# Patient Record
Sex: Female | Born: 1945 | Race: White | Hispanic: No | Marital: Married | Smoking: Former smoker
Health system: Southern US, Community
[De-identification: ages and names within clinical notes are randomized; demographics above are authoritative.]

## PROBLEM LIST (undated history)

## (undated) DIAGNOSIS — R911 Solitary pulmonary nodule: Secondary | ICD-10-CM

## (undated) DIAGNOSIS — I771 Stricture of artery: Secondary | ICD-10-CM

## (undated) DIAGNOSIS — Z720 Tobacco use: Secondary | ICD-10-CM

## (undated) DIAGNOSIS — I700 Atherosclerosis of aorta: Secondary | ICD-10-CM

## (undated) DIAGNOSIS — I872 Venous insufficiency (chronic) (peripheral): Secondary | ICD-10-CM

## (undated) DIAGNOSIS — D649 Anemia, unspecified: Secondary | ICD-10-CM

## (undated) DIAGNOSIS — K5790 Diverticulosis of intestine, part unspecified, without perforation or abscess without bleeding: Secondary | ICD-10-CM

## (undated) DIAGNOSIS — J189 Pneumonia, unspecified organism: Secondary | ICD-10-CM

## (undated) DIAGNOSIS — B029 Zoster without complications: Secondary | ICD-10-CM

## (undated) DIAGNOSIS — E559 Vitamin D deficiency, unspecified: Secondary | ICD-10-CM

## (undated) DIAGNOSIS — R040 Epistaxis: Secondary | ICD-10-CM

## (undated) DIAGNOSIS — G5603 Carpal tunnel syndrome, bilateral upper limbs: Secondary | ICD-10-CM

## (undated) DIAGNOSIS — I5030 Unspecified diastolic (congestive) heart failure: Secondary | ICD-10-CM

## (undated) DIAGNOSIS — K219 Gastro-esophageal reflux disease without esophagitis: Secondary | ICD-10-CM

## (undated) DIAGNOSIS — I10 Essential (primary) hypertension: Secondary | ICD-10-CM

## (undated) DIAGNOSIS — R519 Headache, unspecified: Secondary | ICD-10-CM

## (undated) DIAGNOSIS — F419 Anxiety disorder, unspecified: Secondary | ICD-10-CM

## (undated) DIAGNOSIS — E039 Hypothyroidism, unspecified: Secondary | ICD-10-CM

## (undated) DIAGNOSIS — M8580 Other specified disorders of bone density and structure, unspecified site: Secondary | ICD-10-CM

## (undated) DIAGNOSIS — I2510 Atherosclerotic heart disease of native coronary artery without angina pectoris: Secondary | ICD-10-CM

## (undated) DIAGNOSIS — M353 Polymyalgia rheumatica: Secondary | ICD-10-CM

## (undated) DIAGNOSIS — J449 Chronic obstructive pulmonary disease, unspecified: Secondary | ICD-10-CM

## (undated) DIAGNOSIS — R51 Headache: Secondary | ICD-10-CM

## (undated) DIAGNOSIS — R0600 Dyspnea, unspecified: Secondary | ICD-10-CM

## (undated) DIAGNOSIS — S29011A Strain of muscle and tendon of front wall of thorax, initial encounter: Secondary | ICD-10-CM

## (undated) DIAGNOSIS — R112 Nausea with vomiting, unspecified: Secondary | ICD-10-CM

## (undated) DIAGNOSIS — Z9889 Other specified postprocedural states: Secondary | ICD-10-CM

## (undated) DIAGNOSIS — D3502 Benign neoplasm of left adrenal gland: Secondary | ICD-10-CM

## (undated) DIAGNOSIS — I5189 Other ill-defined heart diseases: Secondary | ICD-10-CM

## (undated) HISTORY — DX: Headache: R51

## (undated) HISTORY — DX: Polymyalgia rheumatica: M35.3

## (undated) HISTORY — PX: ESOPHAGOGASTRODUODENOSCOPY: SHX1529

## (undated) HISTORY — PX: TUBAL LIGATION: SHX77

## (undated) HISTORY — DX: Tobacco use: Z72.0

## (undated) HISTORY — PX: CARPAL TUNNEL RELEASE: SHX101

## (undated) HISTORY — PX: CERVICAL FUSION: SHX112

## (undated) HISTORY — DX: Venous insufficiency (chronic) (peripheral): I87.2

## (undated) HISTORY — DX: Other specified disorders of bone density and structure, unspecified site: M85.80

## (undated) HISTORY — PX: COLONOSCOPY: SHX174

## (undated) HISTORY — DX: Headache, unspecified: R51.9

## (undated) HISTORY — DX: Chronic obstructive pulmonary disease, unspecified: J44.9

## (undated) HISTORY — PX: HEMORRHOID SURGERY: SHX153

## (undated) HISTORY — PX: APPENDECTOMY: SHX54

## (undated) HISTORY — PX: LUMBAR FUSION: SHX111

## (undated) HISTORY — PX: BACK SURGERY: SHX140

## (undated) HISTORY — DX: Anxiety disorder, unspecified: F41.9

## (undated) HISTORY — DX: Gastro-esophageal reflux disease without esophagitis: K21.9

## (undated) HISTORY — DX: Pneumonia, unspecified organism: J18.9

## (undated) HISTORY — PX: ABDOMINAL HYSTERECTOMY: SHX81

## (undated) HISTORY — PX: TOTAL ABDOMINAL HYSTERECTOMY W/ BILATERAL SALPINGOOPHORECTOMY: SHX83

## (undated) HISTORY — DX: Strain of muscle and tendon of front wall of thorax, initial encounter: S29.011A

## (undated) HISTORY — PX: CHOLECYSTECTOMY: SHX55

---
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
## 1999-03-24 ENCOUNTER — Other Ambulatory Visit: Admission: RE | Admit: 1999-03-24 | Discharge: 1999-03-24 | Payer: Self-pay | Admitting: Obstetrics and Gynecology

## 2000-08-01 ENCOUNTER — Encounter: Admission: RE | Admit: 2000-08-01 | Discharge: 2000-08-01 | Payer: Self-pay | Admitting: Obstetrics and Gynecology

## 2000-08-01 ENCOUNTER — Encounter: Payer: Self-pay | Admitting: Obstetrics and Gynecology

## 2001-04-17 ENCOUNTER — Encounter: Payer: Self-pay | Admitting: Obstetrics and Gynecology

## 2001-04-17 ENCOUNTER — Encounter: Admission: RE | Admit: 2001-04-17 | Discharge: 2001-04-17 | Payer: Self-pay | Admitting: Obstetrics and Gynecology

## 2001-08-08 ENCOUNTER — Encounter: Admission: RE | Admit: 2001-08-08 | Discharge: 2001-08-08 | Payer: Self-pay | Admitting: Obstetrics and Gynecology

## 2001-08-08 ENCOUNTER — Encounter: Payer: Self-pay | Admitting: Obstetrics and Gynecology

## 2002-05-21 ENCOUNTER — Encounter: Payer: Self-pay | Admitting: Obstetrics and Gynecology

## 2002-05-21 ENCOUNTER — Encounter: Admission: RE | Admit: 2002-05-21 | Discharge: 2002-05-21 | Payer: Self-pay | Admitting: Obstetrics and Gynecology

## 2003-05-24 ENCOUNTER — Encounter: Admission: RE | Admit: 2003-05-24 | Discharge: 2003-05-24 | Payer: Self-pay | Admitting: Obstetrics and Gynecology

## 2003-10-13 LAB — HM COLONOSCOPY: HM Colonoscopy: NORMAL

## 2004-05-24 ENCOUNTER — Encounter: Admission: RE | Admit: 2004-05-24 | Discharge: 2004-05-24 | Payer: Self-pay | Admitting: Obstetrics and Gynecology

## 2004-06-22 ENCOUNTER — Ambulatory Visit (HOSPITAL_COMMUNITY): Admission: RE | Admit: 2004-06-22 | Discharge: 2004-06-23 | Payer: Self-pay | Admitting: Neurosurgery

## 2004-06-22 HISTORY — PX: ANTERIOR CERVICAL DECOMP/DISCECTOMY FUSION: SHX1161

## 2004-07-12 ENCOUNTER — Encounter: Admission: RE | Admit: 2004-07-12 | Discharge: 2004-07-12 | Payer: Self-pay | Admitting: Neurosurgery

## 2005-03-14 ENCOUNTER — Emergency Department (HOSPITAL_COMMUNITY): Admission: EM | Admit: 2005-03-14 | Discharge: 2005-03-14 | Payer: Self-pay | Admitting: Emergency Medicine

## 2005-05-25 ENCOUNTER — Encounter: Admission: RE | Admit: 2005-05-25 | Discharge: 2005-05-25 | Payer: Self-pay | Admitting: Obstetrics and Gynecology

## 2005-06-18 ENCOUNTER — Encounter: Admission: RE | Admit: 2005-06-18 | Discharge: 2005-06-18 | Payer: Self-pay | Admitting: Family Medicine

## 2005-10-27 ENCOUNTER — Ambulatory Visit: Payer: Self-pay | Admitting: Rheumatology

## 2006-04-24 ENCOUNTER — Emergency Department (HOSPITAL_COMMUNITY): Admission: EM | Admit: 2006-04-24 | Discharge: 2006-04-24 | Payer: Self-pay | Admitting: Emergency Medicine

## 2006-05-29 ENCOUNTER — Encounter: Admission: RE | Admit: 2006-05-29 | Discharge: 2006-05-29 | Payer: Self-pay | Admitting: Family Medicine

## 2006-12-13 ENCOUNTER — Ambulatory Visit: Payer: Self-pay | Admitting: Family Medicine

## 2007-06-02 ENCOUNTER — Encounter: Payer: Self-pay | Admitting: Family Medicine

## 2007-06-02 ENCOUNTER — Encounter: Admission: RE | Admit: 2007-06-02 | Discharge: 2007-06-02 | Payer: Self-pay | Admitting: Family Medicine

## 2007-06-26 ENCOUNTER — Encounter: Payer: Self-pay | Admitting: Family Medicine

## 2007-06-26 ENCOUNTER — Encounter: Admission: RE | Admit: 2007-06-26 | Discharge: 2007-06-26 | Payer: Self-pay | Admitting: Family Medicine

## 2007-07-12 ENCOUNTER — Emergency Department (HOSPITAL_COMMUNITY): Admission: EM | Admit: 2007-07-12 | Discharge: 2007-07-13 | Payer: Self-pay | Admitting: Emergency Medicine

## 2008-03-28 ENCOUNTER — Emergency Department (HOSPITAL_COMMUNITY): Admission: EM | Admit: 2008-03-28 | Discharge: 2008-03-29 | Payer: Self-pay | Admitting: Emergency Medicine

## 2008-04-06 ENCOUNTER — Encounter: Payer: Self-pay | Admitting: Family Medicine

## 2008-06-08 ENCOUNTER — Encounter: Admission: RE | Admit: 2008-06-08 | Discharge: 2008-06-08 | Payer: Self-pay | Admitting: Family Medicine

## 2008-06-08 ENCOUNTER — Encounter: Payer: Self-pay | Admitting: Family Medicine

## 2008-06-11 LAB — HM PAP SMEAR

## 2008-06-11 LAB — CONVERTED CEMR LAB: Pap Smear: NORMAL

## 2008-06-22 ENCOUNTER — Encounter: Payer: Self-pay | Admitting: Family Medicine

## 2008-06-29 ENCOUNTER — Encounter: Payer: Self-pay | Admitting: Family Medicine

## 2008-08-31 ENCOUNTER — Encounter: Payer: Self-pay | Admitting: Family Medicine

## 2008-09-28 ENCOUNTER — Encounter: Payer: Self-pay | Admitting: Family Medicine

## 2008-10-01 ENCOUNTER — Ambulatory Visit: Payer: Self-pay | Admitting: Family Medicine

## 2008-10-01 DIAGNOSIS — J4489 Other specified chronic obstructive pulmonary disease: Secondary | ICD-10-CM | POA: Insufficient documentation

## 2008-10-01 DIAGNOSIS — R519 Headache, unspecified: Secondary | ICD-10-CM | POA: Insufficient documentation

## 2008-10-01 DIAGNOSIS — J449 Chronic obstructive pulmonary disease, unspecified: Secondary | ICD-10-CM | POA: Insufficient documentation

## 2008-10-01 DIAGNOSIS — F418 Other specified anxiety disorders: Secondary | ICD-10-CM | POA: Insufficient documentation

## 2008-10-01 DIAGNOSIS — F17200 Nicotine dependence, unspecified, uncomplicated: Secondary | ICD-10-CM | POA: Insufficient documentation

## 2008-10-01 DIAGNOSIS — R51 Headache: Secondary | ICD-10-CM | POA: Insufficient documentation

## 2008-10-01 DIAGNOSIS — M1990 Unspecified osteoarthritis, unspecified site: Secondary | ICD-10-CM | POA: Insufficient documentation

## 2008-10-01 DIAGNOSIS — K219 Gastro-esophageal reflux disease without esophagitis: Secondary | ICD-10-CM | POA: Insufficient documentation

## 2008-10-04 ENCOUNTER — Telehealth: Payer: Self-pay | Admitting: Family Medicine

## 2008-10-05 ENCOUNTER — Ambulatory Visit: Payer: Self-pay | Admitting: Cardiology

## 2008-10-19 ENCOUNTER — Ambulatory Visit: Payer: Self-pay | Admitting: Family Medicine

## 2008-12-07 ENCOUNTER — Ambulatory Visit: Payer: Self-pay | Admitting: Family Medicine

## 2009-01-25 ENCOUNTER — Ambulatory Visit: Payer: Self-pay | Admitting: Family Medicine

## 2009-01-25 DIAGNOSIS — L84 Corns and callosities: Secondary | ICD-10-CM | POA: Insufficient documentation

## 2009-04-14 ENCOUNTER — Ambulatory Visit: Payer: Self-pay | Admitting: Family Medicine

## 2009-05-20 ENCOUNTER — Ambulatory Visit: Payer: Self-pay | Admitting: Family Medicine

## 2009-05-20 ENCOUNTER — Encounter: Admission: RE | Admit: 2009-05-20 | Discharge: 2009-05-20 | Payer: Self-pay | Admitting: Internal Medicine

## 2009-05-20 DIAGNOSIS — M25579 Pain in unspecified ankle and joints of unspecified foot: Secondary | ICD-10-CM | POA: Insufficient documentation

## 2009-05-27 ENCOUNTER — Ambulatory Visit: Payer: Self-pay | Admitting: Family Medicine

## 2009-05-27 DIAGNOSIS — J209 Acute bronchitis, unspecified: Secondary | ICD-10-CM | POA: Insufficient documentation

## 2009-06-17 ENCOUNTER — Encounter: Admission: RE | Admit: 2009-06-17 | Discharge: 2009-06-17 | Payer: Self-pay | Admitting: Obstetrics and Gynecology

## 2009-06-17 LAB — HM MAMMOGRAPHY

## 2009-07-22 ENCOUNTER — Encounter: Admission: RE | Admit: 2009-07-22 | Discharge: 2009-07-22 | Payer: Self-pay | Admitting: Obstetrics and Gynecology

## 2009-07-29 ENCOUNTER — Ambulatory Visit: Payer: Self-pay | Admitting: Family Medicine

## 2009-07-29 DIAGNOSIS — M5430 Sciatica, unspecified side: Secondary | ICD-10-CM | POA: Insufficient documentation

## 2009-08-01 ENCOUNTER — Telehealth: Payer: Self-pay | Admitting: Family Medicine

## 2009-09-27 ENCOUNTER — Encounter: Payer: Self-pay | Admitting: Family Medicine

## 2009-09-29 ENCOUNTER — Ambulatory Visit: Payer: Self-pay | Admitting: Family Medicine

## 2009-09-29 DIAGNOSIS — M67919 Unspecified disorder of synovium and tendon, unspecified shoulder: Secondary | ICD-10-CM | POA: Insufficient documentation

## 2009-09-29 DIAGNOSIS — E039 Hypothyroidism, unspecified: Secondary | ICD-10-CM | POA: Insufficient documentation

## 2009-09-29 DIAGNOSIS — M719 Bursopathy, unspecified: Secondary | ICD-10-CM

## 2009-11-07 ENCOUNTER — Ambulatory Visit: Payer: Self-pay | Admitting: Family Medicine

## 2009-11-07 LAB — CONVERTED CEMR LAB: TSH: 1.5 microintl units/mL (ref 0.35–5.50)

## 2009-12-22 ENCOUNTER — Ambulatory Visit: Payer: Self-pay | Admitting: Family Medicine

## 2009-12-22 DIAGNOSIS — J069 Acute upper respiratory infection, unspecified: Secondary | ICD-10-CM | POA: Insufficient documentation

## 2009-12-22 LAB — CONVERTED CEMR LAB: Rapid Strep: NEGATIVE

## 2010-01-30 ENCOUNTER — Ambulatory Visit
Admission: RE | Admit: 2010-01-30 | Discharge: 2010-01-30 | Payer: Self-pay | Source: Home / Self Care | Attending: Family Medicine | Admitting: Family Medicine

## 2010-01-31 NOTE — Assessment & Plan Note (Signed)
Summary: COUGHING & CONGESTION / LFW   Vital Signs:  Patient profile:   65 year old female Weight:      172 pounds Temp:     98.9 degrees F oral Pulse rate:   64 / minute Pulse rhythm:   regular BP sitting:   140 / 80  (left arm) Cuff size:   regular  Vitals Entered By: Lowella Petties CMA (October 19, 2008 9:07 AM) CC: Cough and congestion   History of Present Illness: 65 yo with 1 week h/o productive cough, wheeze, and subjective fevers.  No sore throat.  Mild nasal congestion.  No shortness of breath, chest pain, n/v/d.  Current Medications (verified): 1)  Enjuvia 0.3 Mg Tabs (Estrogens Conj Synthetic B) .... Take 1 Tablet By Mouth Once A Day 2)  Alendronate Sodium 70 Mg Tabs (Alendronate Sodium) .... Take One Tablet By Mouth Once Per Week 3)  Zyban 150 Mg Xr12h-Tab (Bupropion Hcl (Smoking Deter)) .Marland Kitchen.. 1 Tab By Mouth Daily X 3 Days, Then May Increase To 1 Tab By Mouth Two Times A Day.  Continue For At Least 7 Weeks. Dispense Qs 4)  Augmentin Xr 1000-62.5 Mg Xr12h-Tab (Amoxicillin-Pot Clavulanate) .Marland Kitchen.. 1 Tab By Mouth Two Times A Day X 10 Days 5)  Cheratussin Ac 100-10 Mg/71ml Syrp (Guaifenesin-Codeine) .Marland Kitchen.. 1 Tsp Every 6 Hours As Needed For Cough.  Allergies (verified): No Known Drug Allergies  Review of Systems      See HPI General:  Complains of fever; denies weakness and weight loss. ENT:  Complains of postnasal drainage; denies earache, sinus pressure, and sore throat. CV:  Denies chest pain or discomfort. Resp:  Complains of cough, sputum productive, and wheezing; denies chest discomfort, chest pain with inspiration, and shortness of breath. GI:  Denies change in bowel habits, nausea, and vomiting.  Physical Exam  General:  Well-developed,well-nourished,in no acute distress; alert,appropriate and cooperative throughout examination, overweight. Ears:  External ear exam shows no significant lesions or deformities.  Otoscopic examination reveals clear canals, tympanic  membranes are intact bilaterally without bulging, retraction, inflammation or discharge. Hearing is grossly normal bilaterally. Mouth:  Oral mucosa and oropharynx without lesions or exudates.  Teeth in good repair. Lungs:  normal respiratory effort, diffuse exp wheezes, no crackles.   Heart:  Normal rate and regular rhythm. S1 and S2 normal without gallop, murmur, click, rub or other extra sounds.   Impression & Recommendations:  Problem # 1:  ACUTE BRONCHITIS (ICD-466.0) Assessment New Will treat with 10 day course of Augmentin.  See patient instructions.  Her updated medication list for this problem includes:    Augmentin Xr 1000-62.5 Mg Xr12h-tab (Amoxicillin-pot clavulanate) .Marland Kitchen... 1 tab by mouth two times a day x 10 days    Cheratussin Ac 100-10 Mg/24ml Syrp (Guaifenesin-codeine) .Marland Kitchen... 1 tsp every 6 hours as needed for cough.  Complete Medication List: 1)  Enjuvia 0.3 Mg Tabs (Estrogens conj synthetic b) .... Take 1 tablet by mouth once a day 2)  Alendronate Sodium 70 Mg Tabs (Alendronate sodium) .... Take one tablet by mouth once per week 3)  Zyban 150 Mg Xr12h-tab (Bupropion hcl (smoking deter)) .Marland Kitchen.. 1 tab by mouth daily x 3 days, then may increase to 1 tab by mouth two times a day.  continue for at least 7 weeks. dispense qs 4)  Augmentin Xr 1000-62.5 Mg Xr12h-tab (Amoxicillin-pot clavulanate) .Marland Kitchen.. 1 tab by mouth two times a day x 10 days 5)  Cheratussin Ac 100-10 Mg/26ml Syrp (Guaifenesin-codeine) .Marland Kitchen.. 1 tsp every 6  hours as needed for cough.  Patient Instructions: 1)   You can try mucinex over the counter twice daily as directed and  2)  Please take all your antibiotics as prescribed. 3)  Do not drive after taking the cough medicine.  4)   Call if symptoms worsen or if not improved in 4-5 days Prescriptions: CHERATUSSIN AC 100-10 MG/5ML SYRP (GUAIFENESIN-CODEINE) 1 tsp every 6 hours as needed for cough.  #3 ounces x 0   Entered and Authorized by:   [name] MD   Signed by:    [name] MD on 10/19/2008   Method used:   Print then Give to Patient   RxID:   [id number] AUGMENTIN XR 1000-62.5 MG XR12H-TAB (AMOXICILLIN-POT CLAVULANATE) 1 tab by mouth two times a day x 10 days  #20 x 0   Entered and Authorized by:   [name] MD   Signed by:   [name] MD on 10/19/2008   Method used:   [facility]        [facility] [id number]       [address]       [place], [name]  [address]       Ph: [phone] or [phone]       Fax: [phone]   RxID:   [id number]   Prior Medications (reviewed today): ENJUVIA 0.3 MG TABS (ESTROGENS CONJ SYNTHETIC B) Take 1 tablet by mouth once a day ALENDRONATE SODIUM 70 MG TABS (ALENDRONATE SODIUM) Take one tablet by mouth once per week ZYBAN 150 MG XR12H-TAB (BUPROPION HCL (SMOKING DETER)) 1 tab by mouth daily x 3 days, then may increase to 1 tab by mouth two times a day.  Continue for at least 7 weeks. dispense qs Current Allergies (reviewed today): No known allergies

## 2010-01-31 NOTE — Assessment & Plan Note (Signed)
Summary: CATCH IN BACK PROBLEMS WITH LEFT LEG/RBH   Vital Signs:  Patient profile:   65 year old female Height:      59.75 inches Weight:      177.25 pounds BMI:     35.03 Temp:     98.3 degrees F oral Pulse rate:   88 / minute Pulse rhythm:   regular BP sitting:   150 / 70  (left arm) Cuff size:   large  Vitals Entered By: Linde Gillis CMA Duncan Dull) (July 29, 2009 11:43 AM) CC: catch in back, problems with left leg   History of Present Illness: 65 yo with 2 weeks of worsening left sided back pain with radiulopathy and pain down left lower extremity, past knee. No LE weakness. No urinary symptoms. Pain is the worst when she stands from a seated position or makes sudden movements. Ibuprofen not helping much with pain.  Current Medications (verified): 1)  Enjuvia 0.3 Mg Tabs (Estrogens Conj Synthetic B) .... Take 1 Tablet By Mouth Once A Day 2)  Alendronate Sodium 70 Mg Tabs (Alendronate Sodium) .... Take One Tablet By Mouth Once Per Week 3)  Advair Diskus 250-50 Mcg/dose Misc (Fluticasone-Salmeterol) .Marland Kitchen.. 1 Puff 2 Times Daily 4)  Cyclobenzaprine Hcl 10 Mg  Tabs (Cyclobenzaprine Hcl) .Marland Kitchen.. 1 By Mouth 2 Times Daily As Needed For Back Pain 5)  Prednisone 20 Mg Tabs (Prednisone) .... 3 Tabs By Mouth X 5 Days, 2 Tabs By Mouth X 3 Days, 1 Tab By Mouth X 3 Days Then Stop. Dispense Qs  Allergies (verified): No Known Drug Allergies  Past History:  Past Medical History: Last updated: 2008/10/03 Anxiety COPD GERD TObacco abuse Osteopenia Reportedly normal stress test this year (awaiting records from [facility]).  Past Surgical History: Last updated: 10-03-08 Hysterectomy - over 30 years ago, for endometriosis Lumbar fusion Appendectomy Cholecystectomy Hemorrhoidectomy  Family History: Last updated: 10/03/08 Dad died of lung CA at 56 yo Mom died at 25- sudden cardiac death. Had 9 brothers and sisters- two siblings with HTN and DMII  Social  History: Last updated: 03-Oct-2008 Lives in [place] with her husband.  Takes care of her two grandchildren, ages 19 and 2 everyday in [place].  Has one son who is 53 yo.    Review of Systems      See HPI General:  Denies fever. GU:  Denies dysuria, incontinence, urinary frequency, and urinary hesitancy. MS:  Complains of low back pain; denies muscle weakness.  Physical Exam  General:  Well-developed,well-nourished,in no acute distress; alert,appropriate and cooperative throughout examination, overweight. Msk:  Left lumbar paraspinous muscles tight, NTTP. SLR pos left. Neg fabers. Normal strength Neurologic:  gait normal, slow to rise from chair due to pain. Psych:  memory intact for recent and remote, normally interactive, good eye contact, not anxious appearing, and not depressed appearing.     Impression & Recommendations:  Problem # 1:  SCIATICA, ACUTE (ICD-724.3) Assessment New Given duration of symptoms, will treat with Flexeril and Prednisone. See pt instructions for details. Her updated medication list for this problem includes:    Cyclobenzaprine Hcl 10 Mg Tabs (Cyclobenzaprine hcl) .Marland Kitchen... 1 by mouth 2 times daily as needed for back pain  Complete Medication List: 1)  Enjuvia 0.3 Mg Tabs (Estrogens conj synthetic b) .... Take 1 tablet by mouth once a day 2)  Alendronate Sodium 70 Mg Tabs (Alendronate sodium) .... Take one tablet by mouth once per week 3)  Advair Diskus 250-50 Mcg/dose Misc (Fluticasone-salmeterol) .Marland KitchenMarland KitchenMarland Kitchen 1  puff 2 times daily 4)  Cyclobenzaprine Hcl 10 Mg Tabs (Cyclobenzaprine hcl) .Marland Kitchen.. 1 by mouth 2 times daily as needed for back pain 5)  Prednisone 20 Mg Tabs (Prednisone) .... 3 tabs by mouth x 5 days, 2 tabs by mouth x 3 days, 1 tab by mouth x 3 days then stop. dispense qs  Patient Instructions: 1)  Most patients (90%) with low back pain will improve with time ( 2-6 weeks). Keep active but avoid activities that are painful. Apply moist heat and/or ice to  lower back several times a day.  2)  Prednisone taper as directed, Flexeril as needed. Prescriptions: PREDNISONE 20 MG TABS (PREDNISONE) 3 tabs by mouth x 5 days, 2 tabs by mouth x 3 days, 1 tab by mouth x 3 days then stop. dispense qs  #1 x 0   Entered and Authorized by:   [name] MD   Signed by:   [name] MD on 07/29/2009   Method used:   [facility]        [facility] [id number]       [address]       [place], [name]  [address]       Ph: [phone] or [phone]       Fax: [phone]   RxID:   [id number] CYCLOBENZAPRINE HCL 10 MG  TABS (CYCLOBENZAPRINE HCL) 1 by mouth 2 times daily as needed for back pain  #20 x 0   Entered and Authorized by:   [name] MD   Signed by:   [name] MD on 07/29/2009   Method used:   [facility]        [facility]retail[facility]       [address]       [place], [name]  [address]       Ph: [phone] or [phone]       Fax: [phone]   RxID:   [id number]   Current Allergies (reviewed today): No known allergies

## 2010-01-31 NOTE — Assessment & Plan Note (Signed)
Summary: DISCUSS THYROID,COUGH,L ARM PAIN/   Vital Signs:  Patient profile:   65 year old female Height:      59.75 inches Weight:      180 pounds BMI:     35.58 Temp:     97.7 degrees F oral Pulse rate:   80 / minute Pulse rhythm:   regular BP sitting:   112 / 64  (right arm) Cuff size:   large  Vitals Entered By: Linde Gillis CMA Duncan Dull) (September 29, 2009 12:07 PM) CC: 30 minute appt, discuss thyroid   History of Present Illness: 65 yo well known to me here to discuss several issues.  1.  Low thyroid- saw Dr. Loreta Ave for colonscopy and was called yesterday by their office saying that her TSH was low and needed to be followed up with her primary doctor.  We do not have those labs yet.  She has noticed increased fatigue and dry skin over past several months.  Denies any other symptoms of hypothyroidism.  2.  Left arm pain- h/o arthritis, shoulder always "aches" but now when she reaches overhead, left shoulder and top of arm hurt.  No weakness or radiculopathy.  3.  ?Bronchitis- h/o bronchitis, has had increased cough, sputum production and wheezing over past several weeks.  No fevers, chills, SOB or CP.  Current Medications (verified): 1)  Enjuvia 0.3 Mg Tabs (Estrogens Conj Synthetic B) .... Take 1 Tablet By Mouth Once A Day 2)  Vitamin D (Ergocalciferol) 50000 Unit Caps (Ergocalciferol) .... Take One Tablet By Mouth Once A Week 3)  Levothyroxine Sodium 75 Mcg  Tabs (Levothyroxine Sodium) .Marland Kitchen.. 1 By Mouth Daily 4)  Meloxicam 15 Mg Tabs (Meloxicam) .... One By Mouth Daily 5)  Azithromycin 250 Mg  Tabs (Azithromycin) .... 2 By  Mouth Today and Then 1 Daily For 4 Days  Allergies (verified): 1)  ! Prednisone  Past History:  Past Medical History: Last updated: 2008-10-31 Anxiety COPD GERD TObacco abuse Osteopenia Reportedly normal stress test this year (awaiting records from [facility]).  Past Surgical History: Last updated: 10-31-2008 Hysterectomy - over  30 years ago, for endometriosis Lumbar fusion Appendectomy Cholecystectomy Hemorrhoidectomy  Family History: Last updated: Oct 31, 2008 Dad died of lung CA at 39 yo Mom died at 44- sudden cardiac death. Had 9 brothers and sisters- two siblings with HTN and DMII  Social History: Last updated: 31-Oct-2008 Lives in [place] with her husband.  Takes care of her two grandchildren, ages 65 and 2 everyday in [place].  Has one son who is 72 yo.    Review of Systems      See HPI General:  Denies fever. Eyes:  Denies blurring. ENT:  Complains of postnasal drainage, sinus pressure, and sore throat; denies difficulty swallowing. CV:  Denies chest pain or discomfort. Resp:  Complains of cough, sputum productive, and wheezing; denies shortness of breath. GI:  Denies abdominal pain, nausea, and vomiting. GU:  Denies urinary frequency and urinary hesitancy. MS:  Denies muscle weakness. Derm:  Complains of dryness; denies rash. Neuro:  Denies headaches. Psych:  Denies anxiety and depression. Endo:  Denies cold intolerance, heat intolerance, and weight change. Heme:  Denies abnormal bruising and bleeding.  Physical Exam  General:  Well-developed,well-nourished,in no acute distress; alert,appropriate and cooperative throughout examination, overweight. VSS, non toxic appearing. Head:  Normocephalic and atraumatic without obvious abnormalities. No apparent alopecia. Sinuses NT. Eyes:  vision grossly intact, pupils equal, pupils round, and pupils reactive to light.   Ears:  External ear exam shows no significant lesions or deformities.  Otoscopic examination reveals clear canals, tympanic membranes are intact bilaterally without bulging, retraction, inflammation or discharge. Hearing is grossly normal bilaterally. Nose:  Mildly inflamed nasal mucous membranes. Mouth:  Oral mucosa and oropharynx without lesions or exudates.  Teeth in good repair. Pharynx minimally erythematouis. Neck:  No  deformities, masses, or tenderness noted. Lungs:  Ronchi, congestion and wheezing in bilat bases, LLL>>RLL. Heart:  Normal rate and regular rhythm. S1 and S2 normal without gallop, murmur, click, rub or other extra sounds. Abdomen:  Bowel sounds positive,abdomen soft and non-tender without masses, organomegaly or hernias noted. Msk:  Left shoulder- pos empty can, normal strength, pos arch sign, no ttp over [facility] joint. Pulses:  R and L carotid,radial,femoral,dorsalis pedis and posterior tibial pulses are full and equal bilaterally Extremities:  No clubbing, cyanosis, edema, or deformity noted with normal full range of motion of all joints.   No edema Neurologic:  alert & oriented X3 and gait normal.   Skin:  Intact without suspicious lesions or rashes Cervical Nodes:  No lymphadenopathy noted Psych:  memory intact for recent and remote, normally interactive, good eye contact, not anxious appearing, and not depressed appearing.     Impression & Recommendations:  Problem # 1:  UNSPECIFIED HYPOTHYROIDISM (ICD-244.9) Assessment New Labs requested from Dr. Kenna Gilbert office and reviewed. TSH 6.826. Start Levothyroxine 75 micrograms daily, follow up in 6-8 weeks. Her updated medication list for this problem includes:    Levothyroxine Sodium 75 Mcg Tabs (Levothyroxine sodium) .Marland Kitchen... 1 by mouth daily  Problem # 2:  ROTATOR CUFF INJURY, LEFT SHOULDER (ICD-726.10) Assessment: New Will try conservative management with Meloxicam and stretches- has allergy to prednisone. Unlikely a tear given exam findings today.  Problem # 3:  COPD (ICD-496) Assessment: Deteriorated with bronchitis. Given prior h/o will treat with Zpack, continue Advair with as needed albuterol. The following medications were removed from the medication list:    Advair Diskus 250-50 Mcg/dose Misc (Fluticasone-salmeterol) .Marland Kitchen... 1 puff 2 times daily  Complete Medication List: 1)  Enjuvia 0.3 Mg Tabs (Estrogens conj synthetic b) ....  Take 1 tablet by mouth once a day 2)  Vitamin D (ergocalciferol) 50000 Unit Caps (Ergocalciferol) .... Take one tablet by mouth once a week 3)  Levothyroxine Sodium 75 Mcg Tabs (Levothyroxine sodium) .Marland Kitchen.. 1 by mouth daily 4)  Meloxicam 15 Mg Tabs (Meloxicam) .... One by mouth daily 5)  Azithromycin 250 Mg Tabs (Azithromycin) .... 2 by  mouth today and then 1 daily for 4 days  Patient Instructions: 1)  Please make an appointment to come see me in 6 weeks (morning). Prescriptions: AZITHROMYCIN 250 MG  TABS (AZITHROMYCIN) 2 by  mouth today and then 1 daily for 4 days  #6 x 0   Entered and Authorized by:   [name] MD   Signed by:   [name] MD on 09/29/2009   Method used:   [facility]        [facility] [facility]       [address]       [place], [name]  [address]       Ph: [phone] or [phone]       Fax: [phone]   RxID:   [id number] MELOXICAM 15 MG TABS (MELOXICAM) one by mouth daily  #30 x 0   Entered and Authorized by:   [name] MD   Signed by:   [name] MD on 09/29/2009  Method used:   [facility]        [facility] ([facility]ail)       [address]       [place], [name]  [address]       Ph: [phone] or [phone]       Fax: [phone]   RxID:   [id number] LEVOTHYROXINE SODIUM 75 MCG  TABS (LEVOTHYROXINE SODIUM) 1 by mouth daily  #60 x 0   Entered and Authorized by:   [name] MD   Signed by:   [name] MD on 09/29/2009   Method used:   [facility]        [facility] [facility])       [address]       [place], [name]  [address]       Ph: [phone] or [phone]       Fax: [phone]   RxID:   [id number]   Current Allergies (reviewed today): ! PREDNISONE

## 2010-01-31 NOTE — Progress Notes (Signed)
Summary: Reaction to Prednisone  Phone Note Call from Patient Call back at cell [phone]   Caller: Patient Call For: [name] MD Summary of Call: Patient was given Prednisone on Friday and now she is "beet red" in her face and chest.  She notices some SOB since starting Prednisone, does not feel SOB to the point where she is going to pass out.  Has been on Prednisone before and never had this happen before.  No chest pain, no tingling anywhere, no other symptoms.  She has not taken any medication today.  Please advise.  Patient is aware that Dr. Dayton Martes will not be in until later on this morning, I offered to send this message to another doctor and she refused, she will wait to hear from Dr. Dayton Martes. Initial call taken by: Linde Gillis CMA Duncan Dull),  August 01, 2009 8:18 AM  Follow-up for Phone Call        [facility] to stop the prednisone.  Call us back tomorrow with update on her symptoms.   [name] MD  August 01, 2009 10:12 AM  Patient advised as instructed via telephone.  Her breathing has gotten better.  She will call us back tomorrow and update [place] on her symptoms.  Follow-up by: Linde Gillis CMA Duncan Dull),  August 01, 2009 10:23 AM

## 2010-01-31 NOTE — Assessment & Plan Note (Signed)
Summary: COLD,COUGH/CLE   Vital Signs:  Patient profile:   65 year old female Height:      59.75 inches Weight:      173.25 pounds BMI:     34.24 Temp:     98.3 degrees F oral Pulse rate:   72 / minute Pulse rhythm:   regular BP sitting:   122 / 80  (left arm) Cuff size:   large  Vitals Entered By: Linde Gillis CMA Duncan Dull) (May 27, 2009 8:42 AM) CC: cold, cough   History of Present Illness: Pt here for nasal congestion, sore throat, cough  one week. . She had headache in frontal area now gone, subjective fever. She has right ear congestion, mild rhinitis clear, cough productive of greenish yellow mucous. She is not short of breath.  Qqit smoking Feb 20th!   Current Medications (verified): 1)  Enjuvia 0.3 Mg Tabs (Estrogens Conj Synthetic B) .... Take 1 Tablet By Mouth Once A Day 2)  Alendronate Sodium 70 Mg Tabs (Alendronate Sodium) .... Take One Tablet By Mouth Once Per Week 3)  Azithromycin 250 Mg  Tabs (Azithromycin) .... 2 By  Mouth Today and Then 1 Daily For 4 Days 4)  Advair Diskus 250-50 Mcg/dose Misc (Fluticasone-Salmeterol) .Marland Kitchen.. 1 Puff 2 Times Daily  Allergies (verified): No Known Drug Allergies  Review of Systems      See HPI General:  Complains of fever. ENT:  Complains of earache, nasal congestion, postnasal drainage, sinus pressure, and sore throat. Resp:  Complains of cough, sputum productive, and wheezing; denies shortness of breath.  Physical Exam  General:  Well-developed,well-nourished,in no acute distress; alert,appropriate and cooperative throughout examination, overweight, mildly congested. Nose:  Mildly inflamed nasal mucous membranes. Mouth:  Oral mucosa and oropharynx without lesions or exudates.  Teeth in good repair. Pharynx minimally erythematouis. Lungs:  Ronchi, congestion and wheezing in bilat bases, LLL>>RLL. Heart:  Normal rate and regular rhythm. S1 and S2 normal without gallop, murmur, click, rub or other extra sounds. Extremities:  No  clubbing, cyanosis, edema, or deformity noted with normal full range of motion of all joints.   Psych:  memory intact for recent and remote, normally interactive, good eye contact, not anxious appearing, and not depressed appearing.     Impression & Recommendations:  Problem # 1:  BRONCHITIS, ACUTE WITH MILD BRONCHOSPASM (ICD-466.0) Assessment New Treat with Zpack and Advair. Continue supportive care with Mucinex. Her updated medication list for this problem includes:    Azithromycin 250 Mg Tabs (Azithromycin) .Marland Kitchen... 2 by  mouth today and then 1 daily for 4 days    Advair Diskus 250-50 Mcg/dose Misc (Fluticasone-salmeterol) .Marland Kitchen... 1 puff 2 times daily  Complete Medication List: 1)  Enjuvia 0.3 Mg Tabs (Estrogens conj synthetic b) .... Take 1 tablet by mouth once a day 2)  Alendronate Sodium 70 Mg Tabs (Alendronate sodium) .... Take one tablet by mouth once per week 3)  Azithromycin 250 Mg Tabs (Azithromycin) .... 2 by  mouth today and then 1 daily for 4 days 4)  Advair Diskus 250-50 Mcg/dose Misc (Fluticasone-salmeterol) .Marland Kitchen.. 1 puff 2 times daily  Patient Instructions: 1)  Take Zpack  as directed.  Drink lots of fluids.  Treat sympotmatically with Mucinex, nasal saline irrigation, and Tylenol/Ibuprofen. . Call if not improving as expected in 5-7 days.  2)  Advair as directed. Prescriptions: ADVAIR DISKUS 250-50 MCG/DOSE MISC (FLUTICASONE-SALMETEROL) 1 puff 2 times daily  #1 x 1   Entered and Authorized by:   [name] MD  Signed by:   [name] MD on 05/27/2009   Method used:   [facility]        [facility]retail)       [address]       [place], [name]  [address]       Ph: [phone] or [phone]       Fax: [phone]   RxID:   [id number] AZITHROMYCIN 250 MG  TABS (AZITHROMYCIN) 2 by  mouth today and then 1 daily for 4 days  #6 x 0   Entered and Authorized by:   [name] MD   Signed by:   [name] MD on 05/27/2009   Method used:    [facility] to        [facility] (retail)       [address]       [place], [name]  [address]       Ph: [phone] or [phone]       Fax: [phone]   RxID:   [id number]   Current Allergies (reviewed today): No known allergies

## 2010-01-31 NOTE — Assessment & Plan Note (Signed)
Summary: ROA FOR 6 WEEK FOLLO-UP/JRR   Vital Signs:  Patient profile:   65 year old female Height:      59.75 inches Weight:      181 pounds BMI:     35.77 Temp:     98.1 degrees F oral Pulse rate:   80 / minute Pulse rhythm:   regular BP sitting:   146 / 80  (left arm) Cuff size:   large  Vitals Entered By: Linde Gillis CMA Duncan Dull) (November 07, 2009 7:53 AM) CC: 6 week follow up thyroid, cough, congestion   History of Present Illness: 65 yo well known to me here to discuss several issues.  1.  Low thyroid-started Levothyroxine 75 micrograms daily 6 weeks ago. Feels much better.  Less fatigue, bowels now normal.  No palpitations, heat or cold intolerance.  2.  ?Bronchitis- h/o bronchitis, has had increased cough, sputum production and wheezing over past several weeks.  Felt feverish last night.  No SOB or CP.  Current Medications (verified): 1)  Enjuvia 0.3 Mg Tabs (Estrogens Conj Synthetic B) .... Take 1 Tablet By Mouth Once A Day 2)  Vitamin D (Ergocalciferol) 50000 Unit Caps (Ergocalciferol) .... Take One Tablet By Mouth Once A Week 3)  Levothyroxine Sodium 75 Mcg  Tabs (Levothyroxine Sodium) .Marland Kitchen.. 1 By Mouth Daily 4)  Meloxicam 15 Mg Tabs (Meloxicam) .... One By Mouth Daily 5)  Avelox 400 Mg  Tabs (Moxifloxacin Hcl) .Marland Kitchen.. 1 Tablet By Mouth Daily X5 Days 6)  Tussionex Pennkinetic Er 8-10 Mg/25ml Lqcr (Chlorpheniramine-Hydrocodone) .... 5 Ml Two Times A Day As Needed Cough.  Allergies: 1)  ! Prednisone  Past History:  Past Medical History: Last updated: Oct 08, 2008 Anxiety COPD GERD TObacco abuse Osteopenia Reportedly normal stress test this year (awaiting records [facility]).  Past Surgical History: Last updated: 2008/10/08 Hysterectomy - over 30 years ago, for endometriosis Lumbar fusion Appendectomy Cholecystectomy Hemorrhoidectomy  Family History: Last updated: 2008/10/08 Dad died of lung CA at 31 yo Mom died at 66- sudden cardiac  death. Had 9 brothers and sisters- two siblings with HTN and DMII  Social History: Last updated: October 08, 2008 Lives in [place] with her husband.  Takes care of her two grandchildren, ages 11 and 2 everyday in [place].  Has one son who is 31 yo.    Review of Systems      See HPI General:  Complains of chills and fever. CV:  Denies chest pain or discomfort. Resp:  Complains of cough, sputum productive, and wheezing. Endo:  Denies cold intolerance and heat intolerance.  Physical Exam  General:  Well-developed,well-nourished,in no acute distress; alert,appropriate and cooperative throughout examination, overweight. VSS, non toxic appearing. Eyes:  vision grossly intact, pupils equal, pupils round, and pupils reactive to light.   Ears:  External ear exam shows no significant lesions or deformities.  Otoscopic examination reveals clear canals, tympanic membranes are intact bilaterally without bulging, retraction, inflammation or discharge. Hearing is grossly normal bilaterally. Nose:  Mildly inflamed nasal mucous membranes. Mouth:  Oral mucosa and oropharynx without lesions or exudates.  Teeth in good repair. Pharynx minimally erythematouis. Neck:  No deformities, masses, or tenderness noted. Lungs:  Ronchi, congestion and wheezing in bilat bases, LLL>>RLL. Heart:  Normal rate and regular rhythm. S1 and S2 normal without gallop, murmur, click, rub or other extra sounds. Abdomen:  Bowel sounds positive,abdomen soft and non-tender without masses, organomegaly or hernias noted. Extremities:  No clubbing, cyanosis, edema, or deformity noted with normal full  range of motion of all joints.   No edema Neurologic:  alert & oriented X3 and gait normal.   Psych:  memory intact for recent and remote, normally interactive, good eye contact, not anxious appearing, and not depressed appearing.     Impression & Recommendations:  Problem # 1:  UNSPECIFIED HYPOTHYROIDISM (ICD-244.9) Assessment  Unchanged recheck TSH today.   Her updated medication list for this problem includes:    Levothyroxine Sodium 75 Mcg Tabs (Levothyroxine sodium) .Marland Kitchen... 1 by mouth daily  Orders: Venipuncture (04540) TLB-TSH (Thyroid Stimulating Hormone) (84443-TSH)  Problem # 2:  BRONCHITIS, ACUTE WITH MILD BRONCHOSPASM (ICD-466.0) Assessment: New Given history, will treat with Avelox. Tussionext as needed cough.  Her updated medication list for this problem includes:    Avelox 400 Mg Tabs (Moxifloxacin hcl) .Marland Kitchen... 1 tablet by mouth daily x5 days    Tussionex Pennkinetic Er 8-10 Mg/3ml Lqcr (Chlorpheniramine-hydrocodone) .Marland KitchenMarland KitchenMarland KitchenMarland Kitchen 5 ml two times a day as needed cough.  Complete Medication List: 1)  Enjuvia 0.3 Mg Tabs (Estrogens conj synthetic b) .... Take 1 tablet by mouth once a day 2)  Vitamin D (ergocalciferol) 50000 Unit Caps (Ergocalciferol) .... Take one tablet by mouth once a week 3)  Levothyroxine Sodium 75 Mcg Tabs (Levothyroxine sodium) .Marland Kitchen.. 1 by mouth daily 4)  Meloxicam 15 Mg Tabs (Meloxicam) .... One by mouth daily 5)  Avelox 400 Mg Tabs (Moxifloxacin hcl) .Marland Kitchen.. 1 tablet by mouth daily x5 days 6)  Tussionex Pennkinetic Er 8-10 Mg/10ml Lqcr (Chlorpheniramine-hydrocodone) .... 5 ml two times a day as needed cough. Prescriptions: TUSSIONEX PENNKINETIC ER 8-10 MG/5ML LQCR (CHLORPHENIRAMINE-HYDROCODONE) 5 ml two times a day as needed cough.  # 5 ounces x 0   Entered and Authorized by:   [name] MD   Signed by:   [name] MD on 11/07/2009   Method used:   Print then Give to Patient   RxID:   [id number] AVELOX 400 MG  TABS (MOXIFLOXACIN HCL) 1 tablet by mouth daily x5 days  #5 x 0   Entered and Authorized by:   [name] MD   Signed by:   [name] MD on 11/07/2009   Method used:   [facility]        [facility]  [address] [id number]       [address]       [place], [name]  [address]       Ph: [phone] or [phone]       Fax: [phone]   RxID:    [id number]    Orders Added: 1)  Venipuncture [88416] 2)  TLB-TSH (Thyroid Stimulating Hormone) [84443-TSH] 3)  Est. Patient Level IV [60630]    Current Allergies (reviewed today): ! PREDNISONE

## 2010-01-31 NOTE — Progress Notes (Signed)
Summary: arm swollen,sore and warm where got tdap  Phone Note Call from Patient Call back at [phone] cell   Call For: [name] MD Summary of Call: Pt was seen 10/01/08 got tetanus shot and her arm is swollen, sore and feels warm to the touch. No redness noted. I explained that the tdap can cause soreness and swelling for a few days but pt wanted me to let Dr. Dayton Martes know and see what she thought pt should do. Please advise.  Pt's contact # is [phone]. Initial call taken by: Lewanda Rife LPN,  October 04, 2008 9:27 AM  Follow-up for Phone Call        Called patient and spoke with her about it.  Completely agree that sounds like a normal reaction to tetatus immunzation.  She will call us if she develops erythema or fevers. Follow-up by: [name] MD,  October 04, 2008 9:33 AM

## 2010-01-31 NOTE — Letter (Signed)
Summary: [facility]  [facility]   Imported By: Sherian Rein 10/13/2009 09:34:16  _____________________________________________________________________  External Attachment:    Type:   Image     Comment:   External Document

## 2010-01-31 NOTE — Assessment & Plan Note (Signed)
Summary: NEW PT TO EST/CLE   Vital Signs:  Patient profile:   65 year old female Height:      59.75 inches Weight:      171.50 pounds BMI:     33.90 Temp:     98.2 degrees F oral Pulse rate:   84 / minute Pulse rhythm:   regular BP sitting:   142 / 80  (left arm) Cuff size:   regular  Vitals Entered By: Delilah Shan CMA Duncan Dull) 10/26/08 9:50 AM) CC: New Patient to Establish   History of Present Illness: 65 yo female here to establish care.  Husband sees Dr. Hetty Ely.  HTN- patient believe it is just white coat syndrome, typically in 140s/80s at doctors offices but in 120s-130s/70s at home.  Did not bring a log in today.  No CP, SOB, or visual changes.  Tobacco abuse - wants to quit.  Has been smoking a pack every two days for over 40 years.  Smokes Vantige 100s.  Quit cold [place] once for two weeks.  Never tried again.  Has problems with bronchitis every year and she does want to expose her grandchildren to cigarette smoke anymore.  Tried Chantix but gave her wierd dreams and felt like she was "in [place]."  Never had SI.  COPD- has been told she has COPD from smoking but never taken anything for it.  Does have a chronic non productive cough and wheezing.  No SOB, never been hospitalized for it.    GERD - burning in her chest every morning.  Has been taking OTC antacids with some reilef of symptoms.  Always worse when she smokes the night before.  Has am non productive cough.  No blood in stool.  No nausea.  HA - has been getting a right parietal headache for two months, daily.  Pain radiates to behind the ear.  No ear pain, issues with balance, or trouble hearing.  Takes NSAIDs for it but never seems to help.  Denies any nausea or visual changes.  Does not wake her up in the morning.  Can occur any time of day. Previous physician have her some SOMA for it.  Does not help.  Feels it is getting worse.  Denies any focal neurological symptoms or trauma to her head.  Allergies  (verified): No Known Drug Allergies  Past History:  Family History: Last updated: Oct 26, 2008 Dad died of lung CA at 2 yo Mom died at 42- sudden cardiac death. Had 9 brothers and sisters- two siblings with HTN and DMII  Social History: Last updated: 10-26-08 Lives in [place] with her husband.  Takes care of her two grandchildren, ages 59 and 2 everyday in [place].  Has one son who is 96 yo.    Past Medical History: Anxiety COPD GERD TObacco abuse Osteopenia Reportedly normal stress test this year (awaiting records from [facility]).  Past Surgical History: Hysterectomy - over 30 years ago, for endometriosis Lumbar fusion Appendectomy Cholecystectomy Hemorrhoidectomy  Family History: Dad died of lung CA at 47 yo Mom died at 75- sudden cardiac death. Had 9 brothers and sisters- two siblings with HTN and DMII  Social History: Lives in [place] with her husband.  Takes care of her two grandchildren, ages 31 and 2 everyday in [place].  Has one son who is 70 yo.    Review of Systems      See HPI General:  Denies fever. Eyes:  Denies blurring, eye pain,  and light sensitivity. ENT:  Denies decreased hearing, ear discharge, and earache. CV:  Denies chest pain or discomfort. Resp:  Denies shortness of breath. GI:  Denies abdominal pain and bloody stools. Derm:  Denies rash. Neuro:  Complains of headaches; denies disturbances in coordination, falling down, memory loss, numbness, sensation of room spinning, visual disturbances, and weakness. Psych:  Complains of anxiety; denies depression.  Physical Exam  General:  Well-developed,well-nourished,in no acute distress; alert,appropriate and cooperative throughout examination, overweight. Head:  TTP over right pareital skull, no palpable nodules Eyes:  No corneal or conjunctival inflammation noted. EOMI. Perrla. Funduscopic exam benign, without hemorrhages, exudates or papilledema. Vision grossly normal.  Ears:  External ear exam shows no significant lesions or deformities.  Otoscopic examination reveals clear canals, tympanic membranes are intact bilaterally without bulging, retraction, inflammation or discharge. Hearing is grossly normal bilaterally. Mouth:  Oral mucosa and oropharynx without lesions or exudates.  Teeth in good repair. Neck:  No deformities, masses, or tenderness noted. Lungs:  Normal respiratory effort, chest expands symmetrically. Lungs are clear to auscultation, no crackles or wheezes. Heart:  Normal rate and regular rhythm. S1 and S2 normal without gallop, murmur, click, rub or other extra sounds. Abdomen:  Bowel sounds positive,abdomen soft and non-tender without masses, organomegaly or hernias noted. Msk:  No deformity or scoliosis noted of thoracic or lumbar spine.   Neurologic:  alert & oriented X3, cranial nerves II-XII intact, gait normal, and DTRs symmetrical and normal.   Psych:  memory intact for recent and remote, normally interactive, good eye contact, not anxious appearing, and not depressed appearing.     Impression & Recommendations:  Problem # 1:  HEADACHE (ICD-784.0) Assessment Deteriorated Perhaps a migraine variant but given that symptoms are worsening and that she tender to palpation on her skull, will send for neuro imaging to rule out vascular or other pathology.  Would prefer MRI but she refuses because of her claustrophobia.  Will instead order a CT.  May require further imaging. Orders: Radiology Referral (Radiology)  Problem # 2:  GERD (ICD-530.81) Assessment: Deteriorated Advised prilosec OTC and that smoking cessation will help.  Problem # 3:  TOBACCO ABUSE (ICD-305.1) Assessment: Unchanged Congratulated her on her willingness to quit!  Discussed multiple alternatives for smoking cessation and will try Zyban.  She is to follow up with me in 1 month. Her updated medication list for this problem includes:    Zyban 150 Mg Xr12h-tab (Bupropion  hcl (smoking deter)) .Marland Kitchen... 1 tab by mouth daily x 3 days, then may increase to 1 tab by mouth two times a day.  continue for at least 7 weeks. dispense qs  Problem # 4:  Preventive Health Care (ICD-V70.0) Assessment: Comment Only Reviewed preventive care protocols, scheduled due services, and updated immunizations Discussed nutrition, exercise, diet, and healthy lifestyle.  UTD on prevention.  Awaiting records for exact dates.  Immunizations: given tetanus and influenza today.  Thinks she had Zostavax already.    Complete Medication List: 1)  Enjuvia 0.3 Mg Tabs (Estrogens conj synthetic b) .... Take 1 tablet by mouth once a day 2)  Alendronate Sodium 70 Mg Tabs (Alendronate sodium) .... Take one tablet by mouth once per week 3)  Zyban 150 Mg Xr12h-tab (Bupropion hcl (smoking deter)) .Marland Kitchen.. 1 tab by mouth daily x 3 days, then may increase to 1 tab by mouth two times a day.  continue for at least 7 weeks. dispense qs  Other Orders: Admin 1st Vaccine (60630) Flu Vaccine 15yrs + (  16109) Tdap => 49yrs IM (60454) Admin of Any Addtl Vaccine (09811)  Patient Instructions: 1)  It was so nice to meet you, Ms. Shanon Rosser. 2)  Please stop by to see Shirlee Limerick on the way out to set up your head CT. 3)  We will get records from your other physicians. 4)  Hope that you have a wonderful weekend. 5)  Please make an appointment to see me in 1 month to follow up on your smoking. Prescriptions: ZYBAN 150 MG XR12H-TAB (BUPROPION HCL (SMOKING DETER)) 1 tab by mouth daily x 3 days, then may increase to 1 tab by mouth two times a day.  Continue for at least 7 weeks. dispense qs  #1 x 0   Entered and Authorized by:   [name] MD   Signed by:   [name] MD on 10/01/2008   Method used:   [facility]        [facility]  [address] [id number]       [address]       [place], [name]  [address]       Ph: [phone] or [phone]       Fax: [phone]   RxID:   [id number]   Current Allergies  (reviewed today): No known allergies   Flu Vaccine Result Date:  10/01/2008 Flu Vaccine Result:  given Flu Vaccine Next Due:  1 yr TD Result Date:  10/01/2008 TD Result:  given TD Next Due:  10 yr Flex Sig Next Due:  Not Indicated Colonoscopy Result Date:  10/13/2003 Colonoscopy Result:  normal Hemoccult Next Due:  Not Indicated PAP Result Date:  06/11/2008 PAP Result:  normal PAP Next Due:  3 yr Mammogram Result Date:  06/18/2008 Mammogram Result:  normal Mammogram Next Due:  1 yr Flu Vaccine Consent Questions     Do you have a history of severe allergic reactions to this vaccine? no    Any prior history of allergic reactions to egg and/or gelatin? no    Do you have a sensitivity to the preservative Thimersol? no    Do you have a past history of Guillan-Barre Syndrome? no    Do you currently have an acute febrile illness? no    Have you ever had a severe reaction to latex? no    Vaccine information given and explained to patient? yes    Are you currently pregnant? no    Lot Number:[id number]   Exp Date:06/30/2009.   Site Given  Left Deltoid IM Lugene Fuquay CMA (AAMA)  October 01, 2008 11:29 AM xt Due:  3 yr Mammogram Result Date:  06/18/2008 Mammogram Result:  normal Mammogram Next Due:  1 yr      .lbflu  Immunizations Administered:  Tetanus Vaccine:    Vaccine Type: Tdap    Site: left deltoid    Mfr: GlaxoSmithKline    Dose: 0.5 ml    Route: IM    Given by: Delilah Shan CMA (AAMA)    Exp. Date: 02/11/2010    Lot #: [id number]    VIS given: 11/19/06 version given October 01, 2008.

## 2010-01-31 NOTE — Assessment & Plan Note (Signed)
Summary: CHECK GROWTH ON FOOT,STOMACH/CLE   Vital Signs:  Patient profile:   65 year old female Height:      59.75 inches Weight:      169.38 pounds BMI:     33.48 Temp:     98.5 degrees F oral Pulse rate:   80 / minute Pulse rhythm:   regular BP sitting:   140 / 70  (left arm) Cuff size:   regular  Vitals Entered By: Delilah Shan CMA Duncan Dull) (January 25, 2009 8:28 AM) CC: Check growth on foot / stomach   History of Present Illness: 65 yo here for:  1.  Painful spot on bottom of right lower foot.   Noticed it a few days ago, hurts to bear weight. No erythema, no pus.  No fevers or chills.  Has not tried anything for it yet.  2.  Stomach pain- at night when she lays down sometimes feels gnawing feeling in epigastric region.  No nausea, vomiting or changes in her bowels.  No blood in stool.  Usually gone by the time she wakes up. Does sometimes have morning dry cough.  H/o GERD but not taking any antacids right now because she did not think this was related issue.  Current Medications (verified): 1)  Enjuvia 0.3 Mg Tabs (Estrogens Conj Synthetic B) .... Take 1 Tablet By Mouth Once A Day 2)  Alendronate Sodium 70 Mg Tabs (Alendronate Sodium) .... Take One Tablet By Mouth Once Per Week 3)  Zyban 150 Mg Xr12h-Tab (Bupropion Hcl (Smoking Deter)) .Marland Kitchen.. 1 Tab By Mouth Daily X 3 Days, Then May Increase To 1 Tab By Mouth Two Times A Day.  Continue For At Least 7 Weeks. Dispense Qs  Allergies (verified): No Known Drug Allergies  Review of Systems      See HPI General:  Denies fever. ENT:  Denies difficulty swallowing. Resp:  Complains of cough; denies shortness of breath, sputum productive, and wheezing. GI:  Complains of abdominal pain; denies bloody stools, change in bowel habits, nausea, and vomiting.  Physical Exam  General:  Well-developed,well-nourished,in no acute distress; alert,appropriate and cooperative throughout examination, overweight. Mouth:  MMM Lungs:  normal  respiratory effort, occasional exp wheezes, no crackles.   Heart:  Normal rate and regular rhythm. S1 and S2 normal without gallop, murmur, click, rub or other extra sounds. Msk:  Right fott- small callus on base of 4th MTP. Psych:  memory intact for recent and remote, normally interactive, good eye contact, not anxious appearing, and not depressed appearing.      Impression & Recommendations:  Problem # 1:  GERD (ICD-530.81) Assessment Deteriorated Given samples of Nexium.  Pt to f/u with me 3 weeks if no improvement of symptoms.  Problem # 2:  CALLUS, RIGHT FOOT (ICD-700) Assessment: New Advised salicytic acid (topical), OTC.  Pt in agreement with plan.  Complete Medication List: 1)  Enjuvia 0.3 Mg Tabs (Estrogens conj synthetic b) .... Take 1 tablet by mouth once a day 2)  Alendronate Sodium 70 Mg Tabs (Alendronate sodium) .... Take one tablet by mouth once per week 3)  Zyban 150 Mg Xr12h-tab (Bupropion hcl (smoking deter)) .Marland Kitchen.. 1 tab by mouth daily x 3 days, then may increase to 1 tab by mouth two times a day.  continue for at least 7 weeks. dispense qs  Current Allergies (reviewed today): No known allergies

## 2010-01-31 NOTE — Assessment & Plan Note (Signed)
Summary: COUGH,ST,CONGESTION,R SIDE PAIN/CLE   Vital Signs:  Patient profile:   65 year old female Weight:      174.75 pounds O2 Sat:      93 % on Room air Temp:     101.4 degrees F oral Pulse rate:   92 / minute Pulse rhythm:   regular BP sitting:   134 / 70  (left arm) Cuff size:   large  Vitals Entered By: Sydell Axon LPN (April 14, 2009 1:51 PM)  O2 Flow:  Room air CC: Productive cough/yellow-green, sore throat, congestion and pain under right breast   History of Present Illness: Pt here for ST for one week. Her voice sounds different. Her throat is scratchy. She had headache in frontal area now gone, fever here 101.4, felt like it last night. She has right ear congestion, mild rhinitis clear, cough productive of greenish yellow mucous. She is not short of breath...quit smoking Feb 20th. She has no N/V. She has taken chloraseptic, Tyl and gargles with hot salt water.   Problems Prior to Update: 1)  Callus, Right Foot  (ICD-700) 2)  Acute Bronchitis  (ICD-466.0) 3)  Tobacco Abuse  (ICD-305.1) 4)  Osteopenia  (ICD-733.90) 5)  Gerd  (ICD-530.81) 6)  COPD  (ICD-496) 7)  Anxiety  (ICD-300.00) 8)  Headache  (ICD-784.0)  Medications Prior to Update: 1)  Enjuvia 0.3 Mg Tabs (Estrogens Conj Synthetic B) .... Take 1 Tablet By Mouth Once A Day 2)  Alendronate Sodium 70 Mg Tabs (Alendronate Sodium) .... Take One Tablet By Mouth Once Per Week 3)  Zyban 150 Mg Xr12h-Tab (Bupropion Hcl (Smoking Deter)) .Marland Kitchen.. 1 Tab By Mouth Daily X 3 Days, Then May Increase To 1 Tab By Mouth Two Times A Day.  Continue For At Least 7 Weeks. Dispense Qs  Allergies: No Known Drug Allergies  Physical Exam  General:  Well-developed,well-nourished,in no acute distress; alert,appropriate and cooperative throughout examination, overweight, mildly congested. Head:  Normocephalic and atraumatic without obvious abnormalities. No apparent alopecia. Sinuses NT. Eyes:  Conjunctiva clear bilaterally.  Ears:   External ear exam shows no significant lesions or deformities.  Otoscopic examination reveals clear canals, tympanic membranes are intact bilaterally without bulging, retraction, inflammation or discharge. Hearing is grossly normal bilaterally. Nose:  Mildly inflamed nasal mucous membranes. Mouth:  Oral mucosa and oropharynx without lesions or exudates.  Teeth in good repair. Pharynx minimally erythematouis. Neck:  No deformities, masses, or tenderness noted. Lungs:  Ronchi, congestion and wheezing in bilat bases, LLL>>RLL. Heart:  Normal rate and regular rhythm. S1 and S2 normal without gallop, murmur, click, rub or other extra sounds.   Impression & Recommendations:  Problem # 1:  ACUTE BRONCHITIS (ICD-466.0) Assessment Deteriorated Recurrent See instructions. Her updated medication list for this problem includes:    Doxycycline Hyclate 100 Mg Caps (Doxycycline hyclate) ..... One tab by mouth two times a day  Complete Medication List: 1)  Enjuvia 0.3 Mg Tabs (Estrogens conj synthetic b) .... Take 1 tablet by mouth once a day 2)  Alendronate Sodium 70 Mg Tabs (Alendronate sodium) .... Take one tablet by mouth once per week 3)  Doxycycline Hyclate 100 Mg Caps (Doxycycline hyclate) .... One tab by mouth two times a day  Patient Instructions: 1)  Take Doxy. 2)  Take Guaifenesin by [facility] or [facility] and getting MUCOUS RELIEF EXPECTORANT (400mg ), take 11/2 tabs by mouth AM and NOON. 3)  Drink lots of fluids anytime taking Guaifenesin.  4)  Take  Tyl ES 2 tabs by mouth three times a day regularly  5)  Gargle for 2 days every 1/2 hr. 6)  Keep lozenge in mouth. Prescriptions: DOXYCYCLINE HYCLATE 100 MG CAPS (DOXYCYCLINE HYCLATE) one tab by mouth two times a day  #20 x 0   Entered and Authorized by:   Shaune Leeks MD   Signed by:   Shaune Leeks MD on 04/14/2009   Method used:   [facility]        [facility]  [address]        [address]       [place], [name]  [address]       Ph: [phone] or [phone]       Fax: [phone]   RxID:   [id number]   Current Allergies (reviewed today): No known allergies

## 2010-01-31 NOTE — Assessment & Plan Note (Signed)
Summary: ,GROWTH ON LEG x 1 wk,   Vital Signs:  Patient profile:   65 year old female Height:      59.75 inches Weight:      173.38 pounds BMI:     34.27 Temp:     98.3 degrees F oral Pulse rate:   80 / minute Pulse rhythm:   regular BP sitting:   122 / 70  (left arm) Cuff size:   large  Vitals Entered By: Linde Gillis CMA (AAMA) (May 20, 2009 9:00 AM) CC:  growth on leg times 1 week   History of Present Illness: 65 yo female here for "growth on leg" x 1 week.  Woke up last week with a large not on medial aspect of right ankle. TTP, not red or warm. Has not increased in size but not going away. Never had anything like this before.  Also needs a note for [employer] saying that she quit smoking. Joining weight watchers next week.  Current Medications (verified): 1)  Enjuvia 0.3 Mg Tabs (Estrogens Conj Synthetic B) .... Take 1 Tablet By Mouth Once A Day 2)  Alendronate Sodium 70 Mg Tabs (Alendronate Sodium) .... Take One Tablet By Mouth Once Per Week  Allergies (verified): No Known Drug Allergies  Review of Systems      See HPI MS:  Complains of joint pain; denies joint redness, joint swelling, and loss of strength.  Physical Exam  General:  Well-developed,well-nourished,in no acute distress; alert,appropriate and cooperative throughout examination, overweight   Foot/Ankle Exam  Ankle Exam:    Right:    Inspection:  Abnormal       Location:  medial malleolus    Stability:  stable    Tenderness:  yes    Swelling:  no    Erythema:  no    FROM    Left:    Inspection:  Normal    Palpation:  Normal    Stability:  stable    Tenderness:  no    Swelling:  no    Erythema:  no    FROM   Impression & Recommendations:  Problem # 1:  ANKLE PAIN, RIGHT (ICD-719.47) Assessment New No known trauam or injury. Not consistent with gout or inflammatory arthritis. Will get xray to rule out bony pathology. Pt in agreement with plan.  Orders: Radiology Referral  (Radiology)  Complete Medication List: 1)  Enjuvia 0.3 Mg Tabs (Estrogens conj synthetic b) .... Take 1 tablet by mouth once a day 2)  Alendronate Sodium 70 Mg Tabs (Alendronate sodium) .... Take one tablet by mouth once per week  Patient Instructions: 1)  I am so proud of you, Ms. Leggio. 2)  Please stop by to see Shirlee Limerick so she can set up your xray of your ankle.  Current Allergies (reviewed today): No known allergies

## 2010-01-31 NOTE — Letter (Signed)
Summary: Out of Work  [facility]  [address] [place], [name] [address]   Phone: [phone]  Fax: [phone]    May 20, 2009   Employee:  EVELLA KASAL Stegeman    To Whom It May Concern:  Ms. Jlyn Cerros quit smoking on February 20,2011. She is making great efforts to improve her health and lifestyle. Now that she has quit smoking, she is working on weight loss strategies.  If you need additional information, please feel free to contact our office.         Sincerely,    [name] MD

## 2010-02-02 NOTE — Assessment & Plan Note (Signed)
Summary: ST,COUGH,CONGESTION/CLE   Vital Signs:  Patient profile:   65 year old female Height:      59.75 inches Weight:      182.25 pounds BMI:     36.02 Temp:     98.6 degrees F oral Pulse rate:   76 / minute Pulse rhythm:   regular BP sitting:   122 / 72  (left arm) Cuff size:   large  Vitals Entered By: Delilah Shan CMA Teresa Jensen) (December 22, 2009 10:58 AM) CC: ST, cough, congestion.  Fever and chills at night.   History of Present Illness: ST.  Subjective fever and chills at night.  bloody rhinorrhea.  Ears are congested.  Sx started yesterday.  coughing, dry.  Mult sick contacts.  Occ wheeze, no more than normal.  Quit smoking.  Not short of breath.  Allergies: 1)  ! Prednisone  Review of Systems       See HPI.  Otherwise negative.    Physical Exam  General:  GEN: nad, alert and oriented HEENT: mucous membranes moist, TM w/o erythema, nasal epithelium injected, OP with cobblestoning, no exudates NECK: supple w/o LA CV: rrr. PULM: ctab, no inc wob ABD: soft, +bs EXT: no edema    Impression & Recommendations:  Problem # 1:  URI (ICD-465.9) Likey viral.  RST neg.  Start ventolin as needed and follow up as needed.  d/w patient and she understood.  Not tender to palpation over sinuses and no indication for antibiotics based on hx/exam.  Lungs clear.  She agrees with plan.  The following medications were removed from the medication list:    Meloxicam 15 Mg Tabs (Meloxicam) ..... One by mouth daily    Tussionex Pennkinetic Er 8-10 Mg/21ml Lqcr (Chlorpheniramine-hydrocodone) .Marland KitchenMarland KitchenMarland KitchenMarland Kitchen 5 ml two times a day as needed cough.  Orders: Prescription Created Electronically [id number])  Complete Medication List: 1)  Enjuvia 0.3 Mg Tabs (Estrogens conj synthetic b) .... Take 1 tablet by mouth once a day 2)  Vitamin D (ergocalciferol) 50000 Unit Caps (Ergocalciferol) .... Take one tablet by mouth once a week 3)  Levothyroxine Sodium 75 Mcg Tabs (Levothyroxine sodium) .Marland Kitchen.. 1 by mouth  daily 4)  Ventolin Hfa 108 (90 Base) Mcg/act Aers (Albuterol sulfate) .... 2 puffs every 4 hours as needed for wheeze or cough  Other Orders: Rapid Strep (60454)  Patient Instructions: 1)  Get plenty of rest, drink lots of clear liquids, and use Tylenol or Ibuprofen for fever and comfort.  Use the ventolin 2 puffs every 4 hours as needed for cough.  Take care.  This should gradually get better.  Prescriptions: VENTOLIN HFA 108 (90 BASE) MCG/ACT AERS (ALBUTEROL SULFATE) 2 puffs every 4 hours as needed for wheeze or cough  #1 x 1   Entered and Authorized by:   Crawford Givens MD   Signed by:   Delilah Shan CMA (AAMA) on 12/22/2009   Method used:   Electronically to        [facility] [id number]retail[facility]       [address]       [place], [name]  [address]       Ph: [phone] or [phone]       Fax: [phone]   RxID:   [id number]    Orders Added: 1)  Est. Patient Level III [25956] 2)  Prescription Created Electronically [[id number]] 3)  Rapid Strep [[id number]]    Laboratory Results  Date/Time Received: December 22, 2009 11:24 AM   Other Tests  Rapid  Strep: negative

## 2010-02-08 NOTE — Assessment & Plan Note (Signed)
Summary: left arm pain/alc   Vital Signs:  Patient profile:   65 year old female Height:      59.75 inches Weight:      185.25 pounds BMI:     36.61 Temp:     97.6 degrees F oral Pulse rate:   72 / minute Pulse rhythm:   regular BP sitting:   132 / 70  (left arm) Cuff size:   large  Vitals Entered By: Linde Gillis CMA Duncan Dull) (January 30, 2010 9:42 AM) CC: left arm pain   History of Present Illness: 65 yo with left shoulder/arm pain for months.  No known injury. Difficult to reach for things in her cabinets or brush her hair. Some tingling into her thumb.  Told she had shoulder problems in past. No UE weakness or difficulty gripping objects.  Current Medications (verified): 1)  Enjuvia 0.3 Mg Tabs (Estrogens Conj Synthetic B) .... Take 1 Tablet By Mouth Once A Day 2)  Vitamin D (Ergocalciferol) 50000 Unit Caps (Ergocalciferol) .... Take One Tablet By Mouth Once A Week 3)  Levothyroxine Sodium 75 Mcg  Tabs (Levothyroxine Sodium) .Marland Kitchen.. 1 By Mouth Daily  Allergies: 1)  ! Prednisone  Past History:  Past Medical History: Last updated: October 10, 2008 Anxiety COPD GERD TObacco abuse Osteopenia Reportedly normal stress test this year (awaiting records from [facility]).  Past Surgical History: Last updated: 10-Oct-2008 Hysterectomy - over 30 years ago, for endometriosis Lumbar fusion Appendectomy Cholecystectomy Hemorrhoidectomy  Family History: Last updated: 10-10-08 Dad died of lung CA at 65 yo Mom died at 65 cardiac death. Had 9 brothers and sisters- two siblings with HTN and DMII  Social History: Last updated: 10/10/08 Lives in [place] with her husband.  Takes care of her two grandchildren, ages 87 and 2 everyday in [place].  Has one son who is 60 yo.    Review of Systems      See HPI MS:  Denies muscle weakness.  Physical Exam  General:  GEN: nad, alert and oriented    Shoulder/Elbow Exam  Shoulder Exam:    Right:  Inspection:  Normal    Palpation:  Normal    Stability:  stable    Tenderness:  no    Swelling:  no    Erythema:  no    Left:    Inspection:  Normal    Palpation:  Normal    Stability:  stable    Tenderness:  right suprascapular    Swelling:  no    Erythema:  no    pos arch, pos empty can.   Impression & Recommendations:  Problem # 1:  ROTATOR CUFF SYNDROME, LEFT (ICD-726.10) Assessment New Refer to ortho for further work up and treatment.   Pt cannot tolerate steroids and wants to take Tylenol only as needed for pain. Orders: Orthopedic Referral (Ortho)  Complete Medication List: 1)  Enjuvia 0.3 Mg Tabs (Estrogens conj synthetic b) .... Take 1 tablet by mouth once a day 2)  Vitamin D (ergocalciferol) 50000 Unit Caps (Ergocalciferol) .... Take one tablet by mouth once a week 3)  Levothyroxine Sodium 75 Mcg Tabs (Levothyroxine sodium) .Marland Kitchen.. 1 by mouth daily  Patient Instructions: 1)  Great to see you Ms. Lacosse. 2)  Please stop by to see Shirlee Limerick on your way out.   Orders Added: 1)  Orthopedic Referral [Ortho] 2)  Est. Patient Level IV [16109]    Current Allergies (reviewed today): ! PREDNISONE

## 2010-03-15 ENCOUNTER — Encounter: Payer: Self-pay | Admitting: Family Medicine

## 2010-04-13 LAB — POCT CARDIAC MARKERS
CKMB, poc: <1 ng/mL — ABNORMAL LOW (ref 1.0–8.0)
CKMB, poc: <1 ng/mL — ABNORMAL LOW (ref 1.0–8.0)
Myoglobin, poc: 55.3 ng/mL (ref 12–200)
Myoglobin, poc: 57.4 ng/mL (ref 12–200)
Troponin i, poc: <0.05 ng/mL (ref 0.00–0.09)
Troponin i, poc: <0.05 ng/mL (ref 0.00–0.09)

## 2010-04-13 LAB — DIFFERENTIAL
Basophils Absolute: 0.1 10*3/uL (ref 0.0–0.1)
Basophils Relative: 1 % (ref 0–1)
Eosinophils Absolute: 0.1 10*3/uL (ref 0.0–0.7)
Eosinophils Relative: 1 % (ref 0–5)
Lymphocytes Relative: 35 % (ref 12–46)
Lymphs Abs: 3.5 10*3/uL (ref 0.7–4.0)
Monocytes Absolute: 0.9 10*3/uL (ref 0.1–1.0)
Monocytes Relative: 9 % (ref 3–12)
Neutro Abs: 5.3 10*3/uL (ref 1.7–7.7)
Neutrophils Relative %: 53 % (ref 43–77)

## 2010-04-13 LAB — CBC
HCT: 44.1 % (ref 36.0–46.0)
Hemoglobin: 15.1 g/dL — ABNORMAL HIGH (ref 12.0–15.0)
MCHC: 34.3 g/dL (ref 30.0–36.0)
MCV: 90.9 fL (ref 78.0–100.0)
Platelets: 337 10*3/uL (ref 150–400)
RBC: 4.85 MIL/uL (ref 3.87–5.11)
RDW: 14.4 % (ref 11.5–15.5)
WBC: 10 10*3/uL (ref 4.0–10.5)

## 2010-04-13 LAB — URINALYSIS, ROUTINE W REFLEX MICROSCOPIC
Bilirubin Urine: NEGATIVE
Glucose, UA: NEGATIVE mg/dL
Hgb urine dipstick: NEGATIVE
Ketones, ur: NEGATIVE mg/dL
Nitrite: NEGATIVE
Protein, ur: NEGATIVE mg/dL
Specific Gravity, Urine: 1.02 (ref 1.005–1.030)
Urobilinogen, UA: 0.2 mg/dL (ref 0.0–1.0)
pH: 5.5 (ref 5.0–8.0)

## 2010-04-13 LAB — POCT I-STAT, CHEM 8
BUN: 11 mg/dL (ref 6–23)
Calcium, Ion: 0.98 mmol/L — ABNORMAL LOW (ref 1.12–1.32)
Chloride: 110 mEq/L (ref 96–112)
Creatinine, Ser: 0.7 mg/dL (ref 0.4–1.2)
Glucose, Bld: 105 mg/dL — ABNORMAL HIGH (ref 70–99)
HCT: 45 % (ref 36.0–46.0)
Hemoglobin: 15.3 g/dL — ABNORMAL HIGH (ref 12.0–15.0)
Potassium: 3.9 mEq/L (ref 3.5–5.1)
Sodium: 138 mEq/L (ref 135–145)
TCO2: 20 mmol/L (ref 0–100)

## 2010-04-13 LAB — BASIC METABOLIC PANEL
BUN: 9 mg/dL (ref 6–23)
CO2: 23 mEq/L (ref 19–32)
Calcium: 8.8 mg/dL (ref 8.4–10.5)
Chloride: 107 mEq/L (ref 96–112)
Creatinine, Ser: 0.58 mg/dL (ref 0.4–1.2)
GFR calc Af Amer: >60 mL/min (ref >60)
GFR calc non Af Amer: >60 mL/min (ref >60)
Glucose, Bld: 110 mg/dL — ABNORMAL HIGH (ref 70–99)
Potassium: 3.8 mEq/L (ref 3.5–5.1)
Sodium: 137 mEq/L (ref 135–145)

## 2010-04-13 LAB — TROPONIN I
Troponin I: <0.01 ng/mL (ref 0.00–0.06)
Troponin I: <0.01 ng/mL (ref 0.00–0.06)

## 2010-04-13 LAB — PROTIME-INR
INR: 1 (ref 0.00–1.49)
Prothrombin Time: 13.7 seconds (ref 11.6–15.2)

## 2010-04-13 LAB — CK TOTAL AND CKMB (NOT AT ARMC)
CK, MB: 1.1 ng/mL (ref 0.3–4.0)
CK, MB: 1.1 ng/mL (ref 0.3–4.0)
Relative Index: INVALID (ref 0.0–2.5)
Relative Index: INVALID (ref 0.0–2.5)
Total CK: 77 U/L (ref 7–177)
Total CK: 78 U/L (ref 7–177)

## 2010-04-13 LAB — APTT: aPTT: 27 seconds (ref 24–37)

## 2010-04-13 LAB — D-DIMER, QUANTITATIVE: D-Dimer, Quant: 0.33 ug/mL-FEU (ref 0.00–0.48)

## 2010-04-17 ENCOUNTER — Other Ambulatory Visit: Payer: Self-pay | Admitting: Family Medicine

## 2010-04-17 DIAGNOSIS — E039 Hypothyroidism, unspecified: Secondary | ICD-10-CM

## 2010-04-17 DIAGNOSIS — Z0000 Encounter for general adult medical examination without abnormal findings: Secondary | ICD-10-CM

## 2010-04-17 DIAGNOSIS — Z136 Encounter for screening for cardiovascular disorders: Secondary | ICD-10-CM

## 2010-04-21 ENCOUNTER — Ambulatory Visit: Payer: Self-pay | Admitting: Family Medicine

## 2010-04-24 ENCOUNTER — Other Ambulatory Visit (INDEPENDENT_AMBULATORY_CARE_PROVIDER_SITE_OTHER): Payer: Medicare Other | Admitting: Family Medicine

## 2010-04-24 DIAGNOSIS — E039 Hypothyroidism, unspecified: Secondary | ICD-10-CM

## 2010-04-24 DIAGNOSIS — Z136 Encounter for screening for cardiovascular disorders: Secondary | ICD-10-CM

## 2010-04-24 DIAGNOSIS — Z0000 Encounter for general adult medical examination without abnormal findings: Secondary | ICD-10-CM

## 2010-04-24 LAB — LIPID PANEL
Cholesterol: 147 mg/dL (ref 0–200)
HDL: 42.6 mg/dL (ref >39.00)
LDL Cholesterol: 87 mg/dL (ref 0–99)
Total CHOL/HDL Ratio: 3
Triglycerides: 87 mg/dL (ref 0.0–149.0)
VLDL: 17.4 mg/dL (ref 0.0–40.0)

## 2010-04-24 LAB — TSH: TSH: 1.98 u[IU]/mL (ref 0.35–5.50)

## 2010-04-24 LAB — BASIC METABOLIC PANEL
BUN: 9 mg/dL (ref 6–23)
CO2: 25 mEq/L (ref 19–32)
Calcium: 8.7 mg/dL (ref 8.4–10.5)
Chloride: 99 mEq/L (ref 96–112)
Creatinine, Ser: 0.6 mg/dL (ref 0.4–1.2)
GFR: 106.61 mL/min (ref >60.00)
Glucose, Bld: 80 mg/dL (ref 70–99)
Potassium: 4.2 mEq/L (ref 3.5–5.1)
Sodium: 140 mEq/L (ref 135–145)

## 2010-05-01 ENCOUNTER — Ambulatory Visit (INDEPENDENT_AMBULATORY_CARE_PROVIDER_SITE_OTHER)
Admission: RE | Admit: 2010-05-01 | Discharge: 2010-05-01 | Disposition: A | Discharge status: Home / Self Care | Payer: Medicare Other | Source: Ambulatory Visit | Attending: Family Medicine | Admitting: Family Medicine

## 2010-05-01 ENCOUNTER — Ambulatory Visit (INDEPENDENT_AMBULATORY_CARE_PROVIDER_SITE_OTHER): Payer: Medicare Other | Admitting: Family Medicine

## 2010-05-01 ENCOUNTER — Encounter: Payer: Self-pay | Admitting: Family Medicine

## 2010-05-01 ENCOUNTER — Other Ambulatory Visit: Payer: Self-pay | Admitting: Family Medicine

## 2010-05-01 ENCOUNTER — Other Ambulatory Visit: Payer: Self-pay

## 2010-05-01 DIAGNOSIS — Z0000 Encounter for general adult medical examination without abnormal findings: Secondary | ICD-10-CM | POA: Insufficient documentation

## 2010-05-01 DIAGNOSIS — R059 Cough, unspecified: Secondary | ICD-10-CM

## 2010-05-01 DIAGNOSIS — R05 Cough: Secondary | ICD-10-CM

## 2010-05-01 DIAGNOSIS — Z0110 Encounter for examination of ears and hearing without abnormal findings: Secondary | ICD-10-CM

## 2010-05-01 DIAGNOSIS — J449 Chronic obstructive pulmonary disease, unspecified: Secondary | ICD-10-CM

## 2010-05-01 DIAGNOSIS — IMO0001 Reserved for inherently not codable concepts without codable children: Secondary | ICD-10-CM

## 2010-05-01 DIAGNOSIS — J3489 Other specified disorders of nose and nasal sinuses: Secondary | ICD-10-CM

## 2010-05-01 DIAGNOSIS — E039 Hypothyroidism, unspecified: Secondary | ICD-10-CM

## 2010-05-01 DIAGNOSIS — Z0100 Encounter for examination of eyes and vision without abnormal findings: Secondary | ICD-10-CM

## 2010-05-01 NOTE — Assessment & Plan Note (Signed)
Stable. Continue current dose of levothyroxine.

## 2010-05-01 NOTE — Assessment & Plan Note (Signed)
Does not seem consistent with COPD at this point, no longer smoking. Does have a lingering cough, lungs clear on exam. Will check CXR today.

## 2010-05-01 NOTE — Progress Notes (Signed)
I have personally reviewed the Medicare Annual Wellness questionnaire and have noted 1. The patient's medical and social history  COPD- has been told she has COPD from smoking but never taken anything for it.  Does have a chronic non productive cough.  Wheezing has stopped since she quit smoking.   No SOB, never been hospitalized for it.    2. Their use of alcohol, tobacco or illicit drugs 3. Their current medications and supplements 4. The patient's functional ability including ADL's, fall risks, home safety risks and hearing or visual             impairment. 5. Diet and physical activities 6. Evidence for depression or mood disorders   Review of Systems       See HPI General:  Denies fever. Eyes:  Denies blurring, eye pain, and light sensitivity. ENT:  Denies decreased hearing, ear discharge, and earache. CV:  Denies chest pain or discomfort. Resp:  Denies shortness of breath. GI:  Denies abdominal pain and bloody stools. Derm:  Denies rash. Neuro: ; denies disturbances in coordination, falling down, memory loss, numbness, sensation of room spinning, visual disturbances, and weakness. Psych:   denies depression.  Physical Exam BP 120/60  Pulse 88  Temp(Src) 99.1 F (37.3 C) (Oral)  Ht 4\' 11"  (1.499 m)  Wt 182 lb (82.555 kg)  BMI 36.76 kg/m2  General:  Well-developed,well-nourished,in no acute distress; alert,appropriate and cooperative throughout examination, overweight. Head:  TTP over right pareital skull, no palpable nodules Eyes:  No corneal or conjunctival inflammation noted. EOMI. Perrla. Funduscopic exam benign, without hemorrhages, exudates or papilledema. Vision grossly normal. Ears:  External ear exam shows no significant lesions or deformities.  Otoscopic examination reveals clear canals, tympanic membranes are intact bilaterally without bulging, retraction, inflammation or discharge. Hearing is grossly normal bilaterally. Mouth:  Oral mucosa and oropharynx without  lesions or exudates.  Teeth in good repair. Neck:  No deformities, masses, or tenderness noted. Lungs:  Normal respiratory effort, chest expands symmetrically. Lungs are clear to auscultation, no crackles or wheezes. Heart:  Normal rate and regular rhythm. S1 and S2 normal without gallop, murmur, click, rub or other extra sounds. Abdomen:  Bowel sounds positive,abdomen soft and non-tender without masses, organomegaly or hernias noted. Msk:  No deformity or scoliosis noted of thoracic or lumbar spine.   Neurologic:  alert & oriented X3, cranial nerves II-XII intact, gait normal, and DTRs symmetrical and normal.   Psych:  memory intact for recent and remote, normally interactive, good eye contact, not anxious appearing, and not depressed appearing.

## 2010-05-01 NOTE — Patient Instructions (Signed)
Call medicare about your Shingles vaccination.

## 2010-05-02 ENCOUNTER — Other Ambulatory Visit: Payer: Self-pay | Admitting: Family Medicine

## 2010-05-02 DIAGNOSIS — J449 Chronic obstructive pulmonary disease, unspecified: Secondary | ICD-10-CM

## 2010-05-09 ENCOUNTER — Other Ambulatory Visit (INDEPENDENT_AMBULATORY_CARE_PROVIDER_SITE_OTHER): Payer: Medicare Other | Admitting: Family Medicine

## 2010-05-09 ENCOUNTER — Other Ambulatory Visit: Payer: Medicare Other

## 2010-05-09 ENCOUNTER — Encounter: Payer: Self-pay | Admitting: *Deleted

## 2010-05-09 DIAGNOSIS — Z1212 Encounter for screening for malignant neoplasm of rectum: Secondary | ICD-10-CM

## 2010-05-09 LAB — FECAL OCCULT BLOOD, IMMUNOCHEMICAL: Fecal Occult Bld: NEGATIVE

## 2010-05-16 ENCOUNTER — Encounter: Payer: Self-pay | Admitting: *Deleted

## 2010-05-19 NOTE — Op Note (Signed)
NAME:  PAGEShaterra, Sanzone              ACCOUNT NO.:  [id number]   MEDICAL RECORD NO.:  [id number]          PATIENT TYPE:  OIB   LOCATION:  2899                         FACILITY:  [facility]   PHYSICIAN:  Reinaldo Meeker, M.D. DATE OF BIRTH:  07-11-1945   DATE OF PROCEDURE:  06/22/2004  DATE OF DISCHARGE:                                 OPERATIVE REPORT   PREOPERATIVE DIAGNOSIS:  Herniated disk, C4-C5 and C5-C6 with spinal  stenosis.   POSTOPERATIVE DIAGNOSIS:  Herniated disk, C4-C5 and C5-C6 with spinal  stenosis.   PROCEDURE:  C4-C5 and C5-C6 anterior cervical diskectomy with bone bank  fusion, followed by spinal __________ cervical plating.   SURGEON:  Reinaldo Meeker, M.D.   ASSISTANT:  Donalee Citrin, M.D.   PROCEDURE IN DETAIL:  After being placed in the supine position and 5 pounds  of halter traction, the patient's neck was prepped and draped in the usual  sterile fashion.  Localizing fluoroscopy was used prior to the incision to  identify the appropriate level.  A transverse incision was made in the right  anterior neck, starting at the midline and heading towards the medial aspect  of the sternocleidomastoid muscle.  The platysmal muscle was then incised  transversely.  The natural fascial plane between the strap muscles medially  and the sternocleidomastoid muscle laterally was identified down to the  anterior aspect of the cervical spine.  The longus coli muscles were  identified and split in the midline, stripped away bilaterally with the  __________ elevator.  A second x-ray was taken which showed approach at the  appropriate level.  Using the 15 blade, the disk was incised at C4-C5 and C5-  C6.  Using pituitary rongeurs and curettes, approximately 90% of the disk  material was removed at both levels.  A high speed drill was used to widen  the interspace.  The microscope was draped, brought onto the field and used  throughout the case.  Starting at C4-C5, the remainder of the  disk material  down to the posterior longitudinal ligament was removed.  Bony overgrowth as  well as diffuse disk herniation was removed and the ligament was then  incised transversely.  The cut edges were removed with a Kerrison punch  until a spinal decompression had been accomplished.  Proximal foraminal  decompression was carried out bilaterally.   Attention was then turned to C5-C6.  Once again, a high speed drill was used  to widen the interspace and the remainder of the disk material at the level  of the posterior longitudinal ligament was removed.  The wound was then  incised transversely.  A large amount of herniated disk material dorsal to  the ligament was identified pressing on the spinal dura and this was  removed.  This gave excellent decompression.  Once again, proximal foraminal  decompression was carried out bilaterally.  At this point, inspection was  carried out in all directions at both levels for any evidence of residual  compression and none could be identified.  A large amount of irrigation was  carried out.  Any bleeding was controlled  with Gelfoam at this time.  Measurements were taken and two 8-mm bone bank plugs were  reconstituted.  After irrigating once more, the plugs were then packed without difficulty  and fluoroscopy showed them to be in good position.  An appropriate length  spinal __________ cervical plate was then chosen.  Under fluoroscopic  guidance, drill holes were placed, followed by placement of 14-mm screws x6.  Locking mechanism was secured at all levels and final fluoroscopy showed the  plates, screws and plugs to all be in good position.  At this point, large  amounts of irrigation were carried out.  Any bleeding on the incision walls  was  controlled with bipolar coagulation and the wound was then closed using  interrupted Vicryl on the platysmal muscle, inverted 5-0 PDS on the  subcuticular layer and Steri-Strips on the skin.  A sterile  dressing was  then applied along with a soft collar and the patient was extubated and  taken to the recovery room in stable condition.       ROK/MEDQ  D:  06/22/2004  T:  06/22/2004  Job:  [id number]

## 2010-05-22 ENCOUNTER — Encounter: Payer: Self-pay | Admitting: Pulmonary Disease

## 2010-05-22 ENCOUNTER — Ambulatory Visit (INDEPENDENT_AMBULATORY_CARE_PROVIDER_SITE_OTHER): Payer: Medicare Other | Admitting: Pulmonary Disease

## 2010-05-22 VITALS — BP 160/70 | HR 77 | Temp 98.9°F | Ht 61.0 in | Wt 186.0 lb

## 2010-05-22 DIAGNOSIS — R059 Cough, unspecified: Secondary | ICD-10-CM

## 2010-05-22 DIAGNOSIS — R05 Cough: Secondary | ICD-10-CM

## 2010-05-22 NOTE — Assessment & Plan Note (Signed)
The pt is describing persistent "bronchitis", that consists of cough with scant mucus production.  It is unclear whether she has airways disease related to her long h/o smoking, or whether this is more upper airway in origin and related to possible LPR.  I think she needs to have full pfts, and if abnormal, would try on a bronchodilator regimen.  If normal, would consider whether to treat aggressively for LPR.  The pt is agreeable with this approach.

## 2010-05-22 NOTE — Progress Notes (Signed)
  Subjective:    Patient ID: Teresa Jensen, female    DOB: [birth year], 65 y.o.   MRN: [id number]  HPI The pt is a 65y/o female who I have been asked to see for peristent cough.  She has a long history of tobacco abuse, but has not smoked in a year.  She has had "congestion" since that time which she describes as cough with thumbnail of yellow mucus, but the cough is primarily dry.  It is worse in the am's upon arising.  She denies sinus issues or postnasal drip, but does have some reflux symptoms that are not frequent.  She describes a one block doe at moderate pace on flat ground, and has issues with stairs.  However, can bring groceries in from car, make a bed, or sweep one room of her house without getting sob.  Of note, her weight is up 40 pounds in last one year.  She has not had recent pfts, but cxr recently shows "bronchitic changes".     Review of Systems  Constitutional: Positive for unexpected weight change. Negative for fever.  HENT: Negative for ear pain, nosebleeds, congestion, sore throat, rhinorrhea, sneezing, trouble swallowing, dental problem, postnasal drip and sinus pressure.   Eyes: Negative for redness and itching.  Respiratory: Positive for cough. Negative for chest tightness, shortness of breath and wheezing.   Cardiovascular: Negative for palpitations and leg swelling.  Gastrointestinal: Negative for nausea and vomiting.  Genitourinary: Negative for dysuria.  Musculoskeletal: Negative for joint swelling.  Skin: Negative for rash.  Neurological: Negative for headaches.  Hematological: Does not bruise/bleed easily.  Psychiatric/Behavioral: Negative for dysphoric mood. The patient is not nervous/anxious.        Objective:   Physical Exam Constitutional: obese female, no acute distress  HENT:  Nares patent without discharge, mild turbinate hypertrophy  Oropharynx without exudate, palate and uvula are elongated.  Eyes:  Perrla, eomi, no scleral icterus  Neck:  No  JVD, no TMG  Cardiovascular:  Normal rate, regular rhythm, no rubs or gallops.  No murmurs        Intact distal pulses  Pulmonary : basilar crackles, no stridor or respiratory distress   no rhonchi, or wheezing  Abdominal:  Soft, nondistended, bowel sounds present.  No tenderness noted.   Musculoskeletal:  No lower extremity edema noted, +varicosities  Lymph Nodes:  No cervical lymphadenopathy noted  Skin:  No cyanosis noted  Neurologic:  Alert, appropriate, moves all 4 extremities without obvious deficit.         Assessment & Plan:

## 2010-05-22 NOTE — Patient Instructions (Signed)
Will set up for breathing tests and see you back same day for review Work on weight reduction.

## 2010-05-25 ENCOUNTER — Encounter: Payer: Self-pay | Admitting: Pulmonary Disease

## 2010-05-26 ENCOUNTER — Other Ambulatory Visit: Payer: Self-pay | Admitting: Family Medicine

## 2010-05-26 ENCOUNTER — Encounter (HOSPITAL_COMMUNITY): Payer: Medicare Other

## 2010-05-26 ENCOUNTER — Ambulatory Visit: Payer: Medicare Other | Admitting: Pulmonary Disease

## 2010-05-26 DIAGNOSIS — Z1231 Encounter for screening mammogram for malignant neoplasm of breast: Secondary | ICD-10-CM

## 2010-06-05 ENCOUNTER — Ambulatory Visit (HOSPITAL_COMMUNITY)
Admission: RE | Admit: 2010-06-05 | Discharge: 2010-06-05 | Disposition: A | Discharge status: Home / Self Care | Payer: Medicare Other | Source: Ambulatory Visit | Attending: Pulmonary Disease | Admitting: Pulmonary Disease

## 2010-06-05 ENCOUNTER — Ambulatory Visit (INDEPENDENT_AMBULATORY_CARE_PROVIDER_SITE_OTHER): Payer: Medicare Other | Admitting: Pulmonary Disease

## 2010-06-05 ENCOUNTER — Encounter: Payer: Self-pay | Admitting: Pulmonary Disease

## 2010-06-05 DIAGNOSIS — R05 Cough: Secondary | ICD-10-CM

## 2010-06-05 DIAGNOSIS — R059 Cough, unspecified: Secondary | ICD-10-CM

## 2010-06-05 DIAGNOSIS — J449 Chronic obstructive pulmonary disease, unspecified: Secondary | ICD-10-CM | POA: Insufficient documentation

## 2010-06-05 LAB — PULMONARY FUNCTION TEST

## 2010-06-05 NOTE — Patient Instructions (Signed)
Will try you on symbicort 160/4.5  2 inhalations am and pm.  Rinse mouth well, gargle, then spit after each use. Try to avoid throat clearing, use hard candy to bathe back of throat if tickle. Please call in 3-4 weeks with your response to treatment, focusing on whether the inhaler helped your cough and your shortness of breath with activity.  Can arrange followup, further treatment at that time.

## 2010-06-05 NOTE — Assessment & Plan Note (Signed)
The pt is continuing to have a cough that she feels is more upper chest that throat area.  Will see how she responds to the inhaler, and if no change, would treat for LPR and postnasal drip more aggressively to see if it resolves.  She will call me in 4 weeks with her response.

## 2010-06-05 NOTE — Assessment & Plan Note (Signed)
The pt has mild to mod airflow obstruction by her pfts, and it is unclear if this is the sole issue for her doe.  I think it is worth trying her on a LABA/ICS, and see how she responds.  I think her weight and conditioning are contributors as well.

## 2010-06-05 NOTE — Progress Notes (Signed)
  Subjective:    Patient ID: Teresa Jensen, female    DOB: [birth year], 65 y.o.   MRN: [id number]  HPI The pt comes in today for f/u of her recent pfts.  She has cough and doe that is being evaluated, and has long h/o tobacco abuse.  Her pfts showed mild airflow obstruction, significant airtrapping, and a severe decrease in dlco that nearly corrects with Av.  I have reviewed the study with her in detail, and answered all of her questions.  The pt continues to have a cough that she feels is coming from a tickle in her upper chest, not throat.  I continue to have concerns about LPR and postnasal drip.    Review of Systems  Constitutional: Negative for fever and unexpected weight change.  HENT: Positive for congestion and sore throat. Negative for ear pain, nosebleeds, rhinorrhea, sneezing, trouble swallowing, dental problem, postnasal drip and sinus pressure.   Eyes: Negative for redness and itching.  Respiratory: Positive for cough. Negative for chest tightness, shortness of breath and wheezing.   Cardiovascular: Negative for palpitations and leg swelling.  Gastrointestinal: Negative for nausea and vomiting.  Genitourinary: Negative for dysuria.  Musculoskeletal: Negative for joint swelling.  Skin: Negative for rash.  Neurological: Negative for headaches.  Hematological: Does not bruise/bleed easily.  Psychiatric/Behavioral: Negative for dysphoric mood. The patient is not nervous/anxious.        Objective:   Physical Exam Ow female in nad No purulence or discharge noted. LE without edema, no cyanosis noted. Alert and oriented, moves all 4        Assessment & Plan:

## 2010-06-14 ENCOUNTER — Encounter: Payer: Self-pay | Admitting: Family Medicine

## 2010-06-14 ENCOUNTER — Ambulatory Visit (INDEPENDENT_AMBULATORY_CARE_PROVIDER_SITE_OTHER): Payer: Medicare Other | Admitting: Family Medicine

## 2010-06-14 VITALS — BP 120/70 | HR 88 | Temp 98.4°F | Ht 59.0 in | Wt 180.5 lb

## 2010-06-14 DIAGNOSIS — R053 Chronic cough: Secondary | ICD-10-CM | POA: Insufficient documentation

## 2010-06-14 DIAGNOSIS — R05 Cough: Secondary | ICD-10-CM

## 2010-06-14 DIAGNOSIS — R059 Cough, unspecified: Secondary | ICD-10-CM

## 2010-06-14 MED ORDER — ALBUTEROL SULFATE HFA 108 (90 BASE) MCG/ACT IN AERS: 2.0000 | INHALATION_SPRAY | Freq: Four times a day (QID) | RESPIRATORY_TRACT | Status: DC | PRN

## 2010-06-14 MED ORDER — AZITHROMYCIN 250 MG PO TABS: ORAL_TABLET | ORAL | Status: AC

## 2010-06-14 NOTE — Progress Notes (Signed)
65 yo here for URI symptoms.  Saw Dr. Shelle Iron last week, PFTs showed mild airflow obstruction, significant airtrapping, and a severe decrease in dlco that nearly corrects with Av.  Given rx for Symbicort inhaler, not yet started it yet.  Since her PFTs, worsening congestion, productive cough.  Had subjective fever last two nights. Sputum becoming thicker. No wheezing. No CP.    Review of Systems        See HPI.  Otherwise negative.    The PMH, PSH, Social History, Family History, Medications, and allergies have been reviewed in [facility], and have been updated if relevant.  Physical Exam BP 120/70  Pulse 88  Temp(Src) 98.4 F (36.9 C) (Oral)  Ht 4\' 11"  (1.499 m)  Wt 180 lb 8 oz (81.874 kg)  BMI 36.46 kg/m2  SpO2 96% General:  GEN: nad, alert and oriented HEENT: mucous membranes moist, TM w/o erythema, nasal epithelium injected, OP with cobblestoning, no exudates NECK: supple w/o LA CV: rrr. PULM: decreased BS at bases, no wheezes, no crackles ABD: soft, +bs EXT: no edema   1. Cough   Deteriorated. Advised to start taking the Symbicort. At this point, she does seem to have infectious component as well as her underlying lung disease. Will cover with [employer], do not want to repeat CXR at this point. No increased WOB. Pro air as needed. The patient indicates understanding of these issues and agrees with the plan.

## 2010-06-19 ENCOUNTER — Ambulatory Visit
Admission: RE | Admit: 2010-06-19 | Discharge: 2010-06-19 | Disposition: A | Discharge status: Home / Self Care | Payer: Medicare Other | Source: Ambulatory Visit | Attending: Family Medicine | Admitting: Family Medicine

## 2010-06-19 DIAGNOSIS — Z1231 Encounter for screening mammogram for malignant neoplasm of breast: Secondary | ICD-10-CM

## 2010-06-20 ENCOUNTER — Encounter: Payer: Self-pay | Admitting: *Deleted

## 2010-06-23 ENCOUNTER — Ambulatory Visit: Payer: Medicare Other | Admitting: Pulmonary Disease

## 2010-06-29 ENCOUNTER — Encounter: Payer: Self-pay | Admitting: Pulmonary Disease

## 2010-07-25 ENCOUNTER — Other Ambulatory Visit: Payer: Self-pay | Admitting: Family Medicine

## 2010-07-25 ENCOUNTER — Encounter: Payer: Self-pay | Admitting: Family Medicine

## 2010-07-25 ENCOUNTER — Ambulatory Visit (INDEPENDENT_AMBULATORY_CARE_PROVIDER_SITE_OTHER): Payer: Medicare Other | Admitting: Family Medicine

## 2010-07-25 VITALS — BP 130/90 | HR 86 | Temp 97.8°F | Wt 185.2 lb

## 2010-07-25 DIAGNOSIS — D239 Other benign neoplasm of skin, unspecified: Secondary | ICD-10-CM

## 2010-07-25 DIAGNOSIS — F4320 Adjustment disorder, unspecified: Secondary | ICD-10-CM

## 2010-07-25 DIAGNOSIS — D229 Melanocytic nevi, unspecified: Secondary | ICD-10-CM

## 2010-07-25 MED ORDER — FLUOXETINE HCL 10 MG PO TABS: 10.0000 mg | ORAL_TABLET | Freq: Every day | ORAL | Status: AC

## 2010-07-25 NOTE — Progress Notes (Signed)
65 yo here for: 1.  Anxiety- Past several months, increased tearfulness and feels "on edge." Husband thinks she is snappy.  Noticed that she has what she thinks may be feelings of panic as well. Watches her two grandsons which she loves, but she has noticed she is more irritable with them. She says she would never harm them, herself or anyone else. Has never been on any antidepressant in past. No CP or SOB. Sleeping ok, appetite the same.  2.  Nevus on back-  Has been there for years, but only recently very itchy.  Has not grown in size.  ROS: See HPI  Patient Active Problem List  Diagnoses  . Unspecified hypothyroidism  . ANXIETY  . TOBACCO ABUSE  . GERD  . CALLUS, RIGHT FOOT  . ANKLE PAIN, RIGHT  . SCIATICA, ACUTE  . ROTATOR CUFF INJURY, LEFT SHOULDER  . OSTEOPENIA  . HEADACHE  . Routine general medical examination at a health care facility  . Cough  . COPD (chronic obstructive pulmonary disease)  . Cough   Past Medical History  Diagnosis Date  . Anxiety   . COPD (chronic obstructive pulmonary disease)   . GERD (gastroesophageal reflux disease)   . Tobacco abuse   . Osteopenia    Past Surgical History  Procedure Date  . Abdominal hysterectomy   . Lumbar fusion   . Appendectomy   . Cholecystectomy   . Hemorrhoid surgery   . Tubal ligation    History  Substance Use Topics  . Smoking status: Former Smoker -- 1.5 packs/day for 40 years    Types: Cigarettes    Quit date: 02/11/2009  . Smokeless tobacco: Never Used  . Alcohol Use: Not on file   Family History  Problem Relation Age of Onset  . Heart disease Mother   . Cancer Father     lung/chest wall and prostate  . Diabetes      siblings  . Hypertension      siblings  . Heart disease Brother    Allergies  Allergen Reactions  . Prednisone     REACTION: hives, difficulty breathing, facial swelling.    Current Outpatient Prescriptions on File Prior to Visit  Medication Sig Dispense Refill  .  albuterol (PROVENTIL HFA;VENTOLIN HFA) 108 (90 BASE) MCG/ACT inhaler Inhale 2 puffs into the lungs every 6 (six) hours as needed for wheezing.  1 Inhaler  1  . alendronate (FOSAMAX) 70 MG tablet Take 70 mg by mouth every 7 (seven) days. Take with a full glass of water on an empty stomach.       . ergocalciferol (VITAMIN D2) 50000 UNITS capsule Take 50,000 Units by mouth once a week.        . estrogens conjugated, synthetic B, (ENJUVIA) 0.3 MG tablet Take 0.3 mg by mouth daily.        Marland Kitchen levothyroxine (SYNTHROID, LEVOTHROID) 75 MCG tablet Take 75 mcg by mouth daily.         The PMH, PSH, Social History, Family History, Medications, and allergies have been reviewed in [facility], and have been updated if relevant.  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 Nevus    Likely SK.  Will remove via shave but advised that it may grow back.  The patient indicates understanding of these issues and agrees with the plan.  Shave biopsy  Indication: suspicious lesion  Location: mid back  Size: 0.5 cm  Verbal informed consent obtained.  Pt aware of risks not limited to but including infection,  bleeding, damage to near by organs.  Prep: etoh/betadine  Anesthesia: 1%lidocaine with epi, good effect  Shave made with dermablade  Minimal oozing, controlled with silver nitrate  Tolerated well  Routine postprocedure instructions d/w pt- keep area clean and bandaged, follow up if concerns/spreading erythema/pain.  2. Adjustment disorder    New.  >25 min spent with face to face with patient, >50% counseling and/or coordinating care. Will start Prozac 10 mg daily.  Pt to follow up in one month.

## 2010-07-25 NOTE — Progress Notes (Signed)
Addended by: Gilmer Mor on: 07/25/2010 09:08 AM   Modules accepted: Orders

## 2010-07-25 NOTE — Progress Notes (Signed)
Addended by: Gilmer Mor on: 07/25/2010 08:51 AM   Modules accepted: Orders

## 2010-07-25 NOTE — Patient Instructions (Signed)
IMPORTANT: HOW TO USE THIS INFORMATION:  This is a summary and does NOT have all possible information about this product. This information does not assure that this product is safe, effective, or appropriate for you. This information is not individual medical advice and does not substitute for the advice of your health care professional. Always ask your health care professional for complete information about this product and your specific health needs.    FLUOXETINE - ORAL ([id number])    COMMON BRAND NAME(S): Prozac, Sarafem    WARNING:  Antidepressant medications are used to treat a variety of conditions, including depression and other mental/mood disorders. These medications can help prevent suicidal thoughts/attempts and provide other important benefits. However, studies have shown that a small number of people (especially people younger than 40) who take antidepressants for any condition may experience worsening depression, other mental/mood symptoms, or suicidal thoughts/attempts. Therefore, it is very important to talk with the doctor about the risks and benefits of antidepressant medication (especially for people younger than 25), even if treatment is not for a mental/mood condition. Tell the doctor immediately if you notice worsening depression/other psychiatric conditions, unusual behavior changes (including possible suicidal thoughts/attempts), or other mental/mood changes (including new/worsening anxiety, panic attacks, trouble sleeping, irritability, hostile/angry feelings, impulsive actions, severe restlessness, very rapid speech). Be especially watchful for these symptoms when a new antidepressant is started or when the dose is changed.    USES:  Fluoxetine is used to treat depression, panic attacks, obsessive compulsive disorder, a certain eating disorder (bulimia), and a severe form of premenstrual syndrome (premenstrual dysphoric disorder). This medication may improve your mood, sleep,  appetite, and energy level and may help restore your interest in daily living. It may decrease fear, anxiety, unwanted thoughts, and the number of panic attacks. It may also reduce the urge to perform repeated tasks (compulsions such as hand-washing, counting, and checking) that interfere with daily living. Fluoxetine may lessen premenstrual symptoms such as irritability, increased appetite, and depression. It may decrease bingeing and purging behaviors in bulimia.    OTHER USES:  This section contains uses of this drug that are not listed in the approved professional labeling for the drug but that may be prescribed by your health care professional. Use this drug for a condition that is listed in this section only if it has been so prescribed by your health care professional. This drug is also used to treat a certain other eating disorder (anorexia nervosa), post traumatic stress disorder (PTSD), and certain nervous system/sleep disorders (catalepsy, narcolepsy).    HOW TO USE:  Read the Medication Guide provided by your pharmacist before you start using fluoxetine and each time you get a refill. If you have any questions, ask your doctor or pharmacist. Take this medication by mouth as directed by your doctor, usually once daily in the morning. If you are taking this medication twice a day, your doctor may direct you to take it in the morning and at noon. If you are taking fluoxetine for premenstrual problems, your doctor may direct you to take it every day of the month or just for the 2 weeks before your period through the first full day of your period. To help you remember, mark your calendar. If you are using the liquid form of this medication, measure the dose carefully using a special measuring device/spoon. Do not use a household spoon because you may not get the correct dose. The dosage is based on your medical condition and response to treatment.  To reduce your risk of side effects, your doctor may  direct you to start this medication at a low dose and gradually increase your dose. Follow your doctor's instructions carefully. Take this medication regularly to get the most benefit from it. To help you remember, take it at the same time each day. It is important to continue taking this medication as prescribed even if you feel well. Do not stop taking this medication without first consulting your doctor. Some conditions may become worse when the drug is abruptly stopped. Your dose may need to be gradually decreased. You should see some improvement in 1 to 2 weeks. It may take 4 to 5 weeks before you feel the full benefit. Tell your doctor if your condition does not improve or if it worsens.    SIDE EFFECTS:  See also Warning section. Nausea, drowsiness, dizziness, anxiety, trouble sleeping, loss of appetite, tiredness, sweating, or yawning may occur. If any of these effects persist or worsen, tell your doctor promptly. Remember that your doctor has prescribed this medication because he or she has judged that the benefit to you is greater than the risk of side effects. Many people using this medication do not have serious side effects. Tell your doctor right away if any of these unlikely but serious side effects occur: unusual or severe mental/mood changes (such as agitation, unusual high energy/excitement, thoughts of suicide), easy bruising/bleeding, muscle weakness/spasm, shakiness (tremor), decreased interest in sex, changes in sexual ability, unusual weight loss. Get medical help right away if any of these rare but serious side effects occur: bloody/black/tarry stools, vomit that looks like coffee grounds, seizures, change in amount of urine. If you have diabetes, fluoxetine may affect your blood sugar levels. Monitor your blood sugar regularly and share the results with your doctor. Your doctor may need to adjust your medication, diet, and exercise when you start or stop fluoxetine. This medication may  rarely cause a very serious condition called serotonin syndrome. The risk increases when this medication is used with certain other drugs (see Drug Interactions section). Get medical help right away if you develop some of the following symptoms: hallucinations, unusual restlessness, loss of coordination, fast heartbeat, severe dizziness, unexplained fever, severe nausea/vomiting/diarrhea, twitchy muscles. Rarely, males may have a painful or prolonged erection lasting 4 or more hours. If this occurs, stop using this drug and get medical help right away, or permanent problems could occur. A very serious allergic reaction to this drug is rare. However, get medical help right away if you notice any symptoms of a serious allergic reaction, including: rash, itching/swelling (especially of the face/tongue/throat), severe dizziness, trouble breathing. This is not a complete list of possible side effects. If you notice other effects not listed above, contact your doctor or pharmacist. In the [place] - Call your doctor for medical advice about side effects. You may report side effects to FDA at 1-800-FDA-1088. In [place] - Call your doctor for medical advice about side effects. You may report side effects to [facility] [place] at [phone].    PRECAUTIONS:  Before taking fluoxetine, tell your doctor or pharmacist if you are allergic to it; or if you have any other allergies. This product may contain inactive ingredients, which can cause allergic reactions or other problems. Talk to your pharmacist for more details. Before using this medication, tell your doctor or pharmacist your medical history, especially of: personal or family history of bipolar/manic-depressive disorder, personal or family history of suicide attempts, liver problems, diabetes, low sodium in  the blood (such as may occur while taking "water pills" - diuretics), severe loss of body water (dehydration), seizures, stomach/intestinal ulcers. This drug may make you  dizzy or drowsy. Do not drive, use machinery, or do any activity that requires alertness until you are sure you can perform such activities safely. Avoid alcoholic beverages. The liquid form of this medication contains alcohol. Caution is advised if you have diabetes, alcohol dependence, or liver disease. Some medications (such as metronidazole, disulfiram) can cause a serious reaction when combined with alcohol. Ask your doctor or pharmacist about using this product safely. Before having surgery, tell your doctor or dentist about all the products you use (including prescription drugs, nonprescription drugs, and herbal products). Older adults may be at greater risk for bleeding while taking this drug. Older adults may also be more likely to develop low sodium in the blood, especially if they are taking "water pills" (diuretics). During pregnancy, this medication should be used only when clearly needed. It may harm an unborn baby. Also, babies born to mothers who have used this drug during the last 3 months of pregnancy may infrequently develop withdrawal symptoms such as feeding/breathing difficulties, seizures, muscle stiffness, or constant crying. If you notice any of these symptoms in your newborn, tell the doctor promptly. Since untreated depression can be a serious condition, do not stop taking this medication unless directed by your doctor. If you are planning pregnancy, become pregnant, or think you may be pregnant, immediately discuss the benefits and risks of using this medication during pregnancy with your doctor. This drug may pass into breast milk and could have undesirable effects on a nursing infant. Therefore, breast-feeding is not recommended while using this drug. Consult your doctor before breast-feeding.    DRUG INTERACTIONS:  Drug interactions may change how your medications work or increase your risk for serious side effects. This document does not contain all possible drug interactions. Keep  a list of all the products you use (including prescription/nonprescription drugs and herbal products) and share it with your doctor and pharmacist. Do not start, stop, or change the dosage of any medicines without your doctor's approval. Fluoxetine can stay in your body for many weeks after your last dose and may interact with many other medications. Before using any medication, tell your doctor or pharmacist if you have taken fluoxetine in the previous 5 weeks. Some products that may interact with this drug include: pimozide, thioridazine, drugs removed from your body by certain liver enzymes including carbamazepine, vinblastine, antiarrhythmics such as propafenone/flecainide, tricyclic antidepressants such as desipramine/imipramine, other drugs that can cause bleeding/bruising including antiplatelet drugs such as clopidogrel, NSAIDs such as ibuprofen, "blood thinners" such as warfarin. Avoid taking MAO inhibitors (isocarboxazid, linezolid, moclobemide, phenelzine, procarbazine, rasagiline, selegiline, tranylcypromine) with fluoxetine for 2 weeks before, during treatment, and at least 5 weeks after your last dose of fluoxetine. In some cases a serious (possibly fatal) drug interaction may occur. Aspirin can increase the risk of bleeding when used with this medication. However, if your doctor has directed you to take low-dose aspirin for heart attack or stroke prevention (usually at dosages of 81-325 milligrams a day), you should continue taking it unless your doctor instructs you otherwise. Before taking fluoxetine, report the use of other drugs that increase serotonin, such as dextromethorphan, lithium, St. John's wort, sibutramine, street drugs such as MDMA/"ecstasy," tramadol, tryptophan, certain antidepressants including SSRIs (such as citalopram, paroxetine) and SNRIs (such as duloxetine, venlafaxine), "triptans" used to treat migraine headaches (such as eletriptan, sumatriptan),  among others. The risk of  serotonin syndrome may be more likely when you start or increase the dose of these medications. Tell your doctor or pharmacist if you are taking other products that cause drowsiness including alcohol, antihistamines (such as cetirizine, diphenhydramine), drugs for sleep or anxiety (such as alprazolam, diazepam, zolpidem), muscle relaxants, and narcotic pain relievers (such as codeine). Check the labels on all your medicines (such as allergy or cough-and-cold products) because they may contain ingredients that cause drowsiness. Ask your pharmacist about using those products safely. This document does not contain all possible interactions. Therefore, before using this product, tell your doctor or pharmacist of all the products you use. Keep a list of all your medications with you, and share the list with your doctor and pharmacist.    OVERDOSE:  If overdose is suspected, contact a poison control center or emergency room immediately. [place] residents can call the [place] National Poison Hotline at 587-844-3554. [place] residents can call a Technical sales engineer center. Symptoms of overdose may include: severe dizziness, fainting.    NOTES:  Do not share this medication with others. Keep all regular medical and psychiatric appointments.    MISSED DOSE:  If you miss a dose, take it as soon as you remember. If it is near the time of the next dose, skip the missed dose and resume your usual dosing schedule. Do not double the dose to catch up.    STORAGE:  Store at room temperature away from light and moisture. Do not store in the bathroom. Keep all medications away from children and pets. Do not flush medications down the toilet or pour them into a drain unless instructed to do so. Properly discard this product when it is expired or no longer needed. Consult your pharmacist or local waste disposal company for more details about how to safely discard your product.    Information last revised November 2010 Copyright(c)  2010 [employer].

## 2010-09-13 ENCOUNTER — Ambulatory Visit (INDEPENDENT_AMBULATORY_CARE_PROVIDER_SITE_OTHER): Payer: Medicare Other | Admitting: Family Medicine

## 2010-09-13 ENCOUNTER — Encounter: Payer: Self-pay | Admitting: Family Medicine

## 2010-09-13 VITALS — BP 130/90 | HR 88 | Temp 98.8°F | Wt 184.2 lb

## 2010-09-13 DIAGNOSIS — J40 Bronchitis, not specified as acute or chronic: Secondary | ICD-10-CM | POA: Insufficient documentation

## 2010-09-13 DIAGNOSIS — M722 Plantar fascial fibromatosis: Secondary | ICD-10-CM

## 2010-09-13 MED ORDER — AZITHROMYCIN 250 MG PO TABS: ORAL_TABLET | ORAL | Status: AC

## 2010-09-13 NOTE — Progress Notes (Signed)
65 yo here for URI symptoms and plantar fascitis.  URI- Known h/o COPD.  No longer taking Symbicort. Using her albuterol inhaler as needed.    Increase sinus congestion for past few nights.     Had subjective fever last two nights. Sputum becoming thicker. Wheezing this morning, relieved by inhaler.   No CP.  No SOB.  Bilateral plantar fascitis- diagnosed years ago.  Past several months, much worse. Few steps of the day are sometimes excruciating. Cannot afford orthotics that ortho recommended.  Review of Systems        See HPI.  Otherwise negative.    Patient Active Problem List  Diagnoses  . Unspecified hypothyroidism  . ANXIETY  . GERD  . CALLUS, RIGHT FOOT  . ANKLE PAIN, RIGHT  . SCIATICA, ACUTE  . ROTATOR CUFF INJURY, LEFT SHOULDER  . OSTEOPENIA  . HEADACHE  . Routine general medical examination at a health care facility  . COPD (chronic obstructive pulmonary disease)  . Cough  . Nevus  . Adjustment disorder  . Plantar fascia syndrome  . Bronchitis   Past Medical History  Diagnosis Date  . Anxiety   . COPD (chronic obstructive pulmonary disease)   . GERD (gastroesophageal reflux disease)   . Tobacco abuse   . Osteopenia    Past Surgical History  Procedure Date  . Abdominal hysterectomy   . Lumbar fusion   . Appendectomy   . Cholecystectomy   . Hemorrhoid surgery   . Tubal ligation    History  Substance Use Topics  . Smoking status: Former Smoker -- 1.5 packs/day for 40 years    Types: Cigarettes    Quit date: 02/11/2009  . Smokeless tobacco: Never Used  . Alcohol Use: Not on file   Family History  Problem Relation Age of Onset  . Heart disease Mother   . Cancer Father     lung/chest wall and prostate  . Diabetes      siblings  . Hypertension      siblings  . Heart disease Brother    Allergies  Allergen Reactions  . Prednisone     REACTION: hives, difficulty breathing, facial swelling.    Current Outpatient Prescriptions on File  Prior to Visit  Medication Sig Dispense Refill  . albuterol (PROVENTIL HFA;VENTOLIN HFA) 108 (90 BASE) MCG/ACT inhaler Inhale 2 puffs into the lungs every 6 (six) hours as needed for wheezing.  1 Inhaler  1  . FLUoxetine (PROZAC) 10 MG tablet Take 1 tablet (10 mg total) by mouth daily.  30 tablet  1  . levothyroxine (SYNTHROID, LEVOTHROID) 75 MCG tablet Take 75 mcg by mouth daily.        . ergocalciferol (VITAMIN D2) 50000 UNITS capsule Take 50,000 Units by mouth once a week.           The PMH, PSH, Social History, Family History, Medications, and allergies have been reviewed in [facility], and have been updated if relevant.  Physical Exam BP 130/90  Pulse 88  Temp(Src) 98.8 F (37.1 C) (Oral)  Wt 184 lb 4 oz (83.575 kg)  General:  GEN: nad, alert and oriented HEENT: mucous membranes moist, TM w/o erythema, nasal epithelium injected, OP with cobblestoning, no exudates NECK: supple w/o LA CV: rrr. PULM: decreased BS at bases, no wheezes, no crackles ABD: soft, +bs EXT: no edema, TTP over bilateral plantar fascial insertion  1. Plantar fascia syndrome   Deteriorated. Given handout from sports medicine advisor with stretches she can do. Also  given written rx for compounded ketoprofen/lidocaine to use topically. The patient indicates understanding of these issues and agrees with the plan.   2. Bronchitis  Deteriorated.  At this point, she does seem to have infectious component as well as her underlying lung disease. Will cover with Zpack, do not want to repeat CXR at this point to minimize recurrent radiation exposure. No increased WOB. Pro air as needed. The patient indicates understanding of these issues and agrees with the plan.

## 2010-09-14 ENCOUNTER — Ambulatory Visit: Payer: Medicare Other | Admitting: Family Medicine

## 2010-09-22 ENCOUNTER — Telehealth: Payer: Self-pay | Admitting: *Deleted

## 2010-09-22 NOTE — Telephone Encounter (Signed)
Pt called to report that she has an extremely itchy rash on her forehead since this morning.  We told her this morning that there were no appts available so she went to [facility] to be checked.  They didn't tell her for sure what it was but gave her a kenolog injection.  She is asking what she should do.  I asked her if they mentioned shingles to her but she said no.  Advised her that if not better tomorrow to call the Saturday clinic for appt. Pt said she would do that.

## 2010-09-23 NOTE — Telephone Encounter (Signed)
Agreed -

## 2010-09-28 LAB — CBC
HCT: 43.2
Hemoglobin: 14.8
MCHC: 34.3
MCV: 92.3
Platelets: 343
RBC: 4.67
RDW: 13.4
WBC: 13.1 — ABNORMAL HIGH

## 2010-09-28 LAB — POCT I-STAT, CHEM 8
BUN: 14
Calcium, Ion: 1.12
Chloride: 106
Creatinine, Ser: 0.7
Glucose, Bld: 120 — ABNORMAL HIGH
HCT: 46
Hemoglobin: 15.6 — ABNORMAL HIGH
Potassium: 3.5
Sodium: 143
TCO2: 25

## 2010-09-28 LAB — DIFFERENTIAL
Basophils Absolute: 0.3 — ABNORMAL HIGH
Basophils Relative: 2 — ABNORMAL HIGH
Eosinophils Absolute: 0.1
Eosinophils Relative: 1
Lymphocytes Relative: 29
Lymphs Abs: 3.8
Monocytes Absolute: 1.1 — ABNORMAL HIGH
Monocytes Relative: 9
Neutro Abs: 7.8 — ABNORMAL HIGH
Neutrophils Relative %: 60

## 2010-09-28 LAB — POCT CARDIAC MARKERS
CKMB, poc: <1 — ABNORMAL LOW
Myoglobin, poc: 43.2
Operator id: 151321
Troponin i, poc: <0.05

## 2010-11-07 ENCOUNTER — Encounter: Payer: Self-pay | Admitting: Family Medicine

## 2010-11-07 ENCOUNTER — Ambulatory Visit (INDEPENDENT_AMBULATORY_CARE_PROVIDER_SITE_OTHER): Payer: Medicare Other | Admitting: Family Medicine

## 2010-11-07 DIAGNOSIS — J329 Chronic sinusitis, unspecified: Secondary | ICD-10-CM

## 2010-11-07 DIAGNOSIS — J069 Acute upper respiratory infection, unspecified: Secondary | ICD-10-CM

## 2010-11-07 MED ORDER — AZITHROMYCIN 250 MG PO TABS: ORAL_TABLET | ORAL | Status: DC

## 2010-11-07 MED ORDER — CHLORPHENIRAMINE-HYDROCODONE 8-10 MG/5ML PO LQCR: 5.0000 mL | Freq: Two times a day (BID) | ORAL | Status: DC | PRN

## 2010-11-07 NOTE — Progress Notes (Signed)
SUBJECTIVE:  Teresa Jensen is a 65 y.o. female who complains of congestion, sore throat, right sinus pain and right ear pain for 10 days. She denies a history of anorexia, chest pain, chills, dizziness, fatigue, nausea and shortness of breath and has a history of asthma. Patient denies smoke cigarettes.   Patient Active Problem List  Diagnoses  . Unspecified hypothyroidism  . ANXIETY  . GERD  . CALLUS, RIGHT FOOT  . ANKLE PAIN, RIGHT  . SCIATICA, ACUTE  . ROTATOR CUFF INJURY, LEFT SHOULDER  . OSTEOPENIA  . HEADACHE  . Routine general medical examination at a health care facility  . COPD (chronic obstructive pulmonary disease)  . Cough  . Nevus  . Adjustment disorder  . Plantar fascia syndrome  . Bronchitis   Past Medical History  Diagnosis Date  . Anxiety   . COPD (chronic obstructive pulmonary disease)   . GERD (gastroesophageal reflux disease)   . Tobacco abuse   . Osteopenia    Past Surgical History  Procedure Date  . Abdominal hysterectomy   . Lumbar fusion   . Appendectomy   . Cholecystectomy   . Hemorrhoid surgery   . Tubal ligation    History  Substance Use Topics  . Smoking status: Former Smoker -- 1.5 packs/day for 40 years    Types: Cigarettes    Quit date: 02/11/2009  . Smokeless tobacco: Never Used  . Alcohol Use: Not on file   Family History  Problem Relation Age of Onset  . Heart disease Mother   . Cancer Father     lung/chest wall and prostate  . Diabetes      siblings  . Hypertension      siblings  . Heart disease Brother    Allergies  Allergen Reactions  . Prednisone     REACTION: hives, difficulty breathing, facial swelling.    Current Outpatient Prescriptions on File Prior to Visit  Medication Sig Dispense Refill  . albuterol (PROVENTIL HFA;VENTOLIN HFA) 108 (90 BASE) MCG/ACT inhaler Inhale 2 puffs into the lungs every 6 (six) hours as needed for wheezing.  1 Inhaler  1  . ergocalciferol (VITAMIN D2) 50000 UNITS capsule Take  50,000 Units by mouth once a week.        . levothyroxine (SYNTHROID, LEVOTHROID) 75 MCG tablet Take 75 mcg by mouth daily.         The PMH, PSH, Social History, Family History, Medications, and allergies have been reviewed in [facility], and have been updated if relevant.  OBJECTIVE: BP 130/80  Pulse 83  Temp(Src) 98.3 F (36.8 C) (Oral)  Ht 4\' 11"  (1.499 m)  Wt 181 lb 12 oz (82.441 kg)  BMI 36.71 kg/m2  She appears well, vital signs are as noted. Ears normal.  Throat and pharynx normal.  Neck supple. No adenopathy in the neck. Nose is congested. Sinuses TTP front, right >left. The chest is clear, without wheezes or rales.  ASSESSMENT:  sinusitis  PLAN: Given duration and progression of symptoms, will treat for bacterial sinusitis. Symptomatic therapy suggested: push fluids, rest and return office visit prn if symptoms persist or worsen.  Call or return to clinic prn if these symptoms worsen or fail to improve as anticipated.

## 2010-11-07 NOTE — Patient Instructions (Signed)
Good to see you Ms. Mesquita. Take the Zpack as directed, Tussionex as needed for cough. Use Ibuprofen up to 800 mg three times daily as needed for pain.

## 2010-11-20 ENCOUNTER — Other Ambulatory Visit: Payer: Self-pay | Admitting: *Deleted

## 2010-11-20 MED ORDER — LEVOTHYROXINE SODIUM 75 MCG PO TABS: 75.0000 ug | ORAL_TABLET | Freq: Every day | ORAL | Status: DC

## 2010-12-06 ENCOUNTER — Ambulatory Visit (INDEPENDENT_AMBULATORY_CARE_PROVIDER_SITE_OTHER): Payer: Medicare Other | Admitting: Family Medicine

## 2010-12-06 ENCOUNTER — Encounter: Payer: Self-pay | Admitting: Family Medicine

## 2010-12-06 VITALS — BP 140/80 | HR 76 | Temp 97.5°F | Ht 59.0 in | Wt 185.2 lb

## 2010-12-06 DIAGNOSIS — IMO0001 Reserved for inherently not codable concepts without codable children: Secondary | ICD-10-CM | POA: Insufficient documentation

## 2010-12-06 LAB — CK: Total CK: 70 U/L (ref 7–177)

## 2010-12-06 LAB — BASIC METABOLIC PANEL
BUN: 10 mg/dL (ref 6–23)
CO2: 25 mEq/L (ref 19–32)
Calcium: 8.9 mg/dL (ref 8.4–10.5)
Chloride: 105 mEq/L (ref 96–112)
Creatinine, Ser: 0.7 mg/dL (ref 0.4–1.2)
GFR: 86.21 mL/min (ref >60.00)
Glucose, Bld: 92 mg/dL (ref 70–99)
Potassium: 4.1 mEq/L (ref 3.5–5.1)
Sodium: 139 mEq/L (ref 135–145)

## 2010-12-06 LAB — MAGNESIUM: Magnesium: 2 mg/dL (ref 1.5–2.5)

## 2010-12-06 LAB — CALCIUM: Calcium: 8.9 mg/dL (ref 8.4–10.5)

## 2010-12-06 LAB — SEDIMENTATION RATE: Sed Rate: 66 mm/hr — ABNORMAL HIGH (ref 0–22)

## 2010-12-06 NOTE — Patient Instructions (Signed)
Good to see you. We will call you with your lab work in the next day or two.

## 2010-12-06 NOTE — Progress Notes (Signed)
Subjective:    Patient ID: Teresa Jensen, female    DOB: December 29, 1945, 65 y.o.   MRN: [id number]  HPI  65 yo here for myalgias.  Past two weeks, having muscle aches in bilateral thighs and sometimes feels it in her hips but feels it is muscular. Wakes her up from sleep at night. Muscles are tender to touch. Never had anything like this before. No new medications. On her feet quite a bit- takes care of her small grandchildren.  No LE weakness.  Patient Active Problem List  Diagnoses  . Unspecified hypothyroidism  . ANXIETY  . GERD  . CALLUS, RIGHT FOOT  . ANKLE PAIN, RIGHT  . SCIATICA, ACUTE  . ROTATOR CUFF INJURY, LEFT SHOULDER  . OSTEOPENIA  . HEADACHE  . Routine general medical examination at a health care facility  . COPD (chronic obstructive pulmonary disease)  . Cough  . Nevus  . Adjustment disorder  . Plantar fascia syndrome  . Bronchitis  . Myalgia and myositis   Past Medical History  Diagnosis Date  . Anxiety   . COPD (chronic obstructive pulmonary disease)   . GERD (gastroesophageal reflux disease)   . Tobacco abuse   . Osteopenia    Past Surgical History  Procedure Date  . Abdominal hysterectomy   . Lumbar fusion   . Appendectomy   . Cholecystectomy   . Hemorrhoid surgery   . Tubal ligation    History  Substance Use Topics  . Smoking status: Former Smoker -- 1.5 packs/day for 40 years    Types: Cigarettes    Quit date: 02/11/2009  . Smokeless tobacco: Never Used  . Alcohol Use: Not on file   Family History  Problem Relation Age of Onset  . Heart disease Mother   . Cancer Father     lung/chest wall and prostate  . Diabetes      siblings  . Hypertension      siblings  . Heart disease Brother    Allergies  Allergen Reactions  . Prednisone     REACTION: hives, difficulty breathing, facial swelling.    Current Outpatient Prescriptions on File Prior to Visit  Medication Sig Dispense Refill  . albuterol (PROVENTIL HFA;VENTOLIN HFA)  108 (90 BASE) MCG/ACT inhaler Inhale 2 puffs into the lungs every 6 (six) hours as needed for wheezing.  1 Inhaler  1  . ergocalciferol (VITAMIN D2) 50000 UNITS capsule Take 50,000 Units by mouth once a week.        . levothyroxine (SYNTHROID, LEVOTHROID) 75 MCG tablet Take 1 tablet (75 mcg total) by mouth daily.  30 tablet  6   The PMH, PSH, Social History, Family History, Medications, and allergies have been reviewed in [facility], and have been updated if relevant.   Review of Systems See HPI No shoulder pain, no radiculopathy. No recent injury or trauma.    Objective:   Physical Exam BP 140/80  Pulse 76  Temp(Src) 97.5 F (36.4 C) (Oral)  Ht 4\' 11"  (1.499 m)  Wt 185 lb 4 oz (84.029 kg)  BMI 37.42 kg/m2  General:  Well-developed,well-nourished,in no acute distress; alert,appropriate and cooperative throughout examination Head:  normocephalic and atraumatic.   Eyes:  vision grossly intact, pupils equal, pupils round, and pupils reactive to light.   Ears:  R ear normal and L ear normal.   Nose:  no external deformity.   Mouth:  good dentition.   Lungs:  Normal respiratory effort, chest expands symmetrically. Lungs are clear to auscultation,  no crackles or wheezes. Heart:  Normal rate and regular rhythm. S1 and S2 normal without gallop, murmur, click, rub or other extra sounds. Abdomen:  Bowel sounds positive,abdomen soft and non-tender without masses, organomegaly or hernias noted. Msk:  No deformity or scoliosis noted of thoracic or lumbar spine.   Extremities:  No clubbing, cyanosis, edema, or deformity noted with normal full range of motion of all joints, does have some TTP over her thighs and hips bilaterally.   Neurologic:  alert & oriented X3 and gait normal, reflexes normal.   Skin:  Intact without suspicious lesions or rashes Psych:  Cognition and judgment appear intact. Alert and cooperative with normal attention span and concentration. No apparent delusions, illusions,  hallucinations      Assessment & Plan:   1. Myalgia and myositis  Sedimentation rate, CK (Creatine Kinase), Magnesium, Calcium, Basic Metabolic Panel (BMET)   New.  Wide ddx- electrolyte abnormalities vs possible PMR/myositis. Will get labs today. If unremarkable, refer to rheum for further work up. The patient indicates understanding of these issues and agrees with the plan.

## 2010-12-07 ENCOUNTER — Other Ambulatory Visit: Payer: Self-pay | Admitting: Family Medicine

## 2010-12-07 DIAGNOSIS — R700 Elevated erythrocyte sedimentation rate: Secondary | ICD-10-CM | POA: Insufficient documentation

## 2010-12-21 ENCOUNTER — Telehealth: Payer: Self-pay | Admitting: Family Medicine

## 2010-12-21 MED ORDER — TRAMADOL HCL 50 MG PO TABS: 50.0000 mg | ORAL_TABLET | Freq: Three times a day (TID) | ORAL | Status: AC | PRN

## 2010-12-21 NOTE — Telephone Encounter (Signed)
Dr. Dayton Martes has seen her for her legs hurting.  The pain is severe and she would like to know if she can have a prescription for pain.  She has an appointment with the arthritis doctor on Jan 13.  Call back is [phone]

## 2010-12-21 NOTE — Telephone Encounter (Signed)
Has she seen the specialist yet? Rx for tramadol sent.

## 2010-12-21 NOTE — Telephone Encounter (Signed)
Patient stated that her pain is worse in her legs, she has been elevated her legs while she sits as best she can.  She wants to know if she can have something called to [facility] for pain.  She has been taking Aleve but it's not helping like it [place] to.  Please advise.

## 2010-12-22 NOTE — Telephone Encounter (Signed)
Her appt is scheduled for Jan. 13th.  Patient advised that Rx was sent to pharmacy.

## 2011-01-16 ENCOUNTER — Ambulatory Visit: Payer: Self-pay | Admitting: Rheumatology

## 2011-01-20 ENCOUNTER — Ambulatory Visit (INDEPENDENT_AMBULATORY_CARE_PROVIDER_SITE_OTHER): Payer: Medicare Other | Admitting: Family Medicine

## 2011-01-20 ENCOUNTER — Encounter: Payer: Self-pay | Admitting: Family Medicine

## 2011-01-20 VITALS — BP 132/72 | Temp 97.4°F | Wt 182.0 lb

## 2011-01-20 DIAGNOSIS — IMO0002 Reserved for concepts with insufficient information to code with codable children: Secondary | ICD-10-CM

## 2011-01-20 DIAGNOSIS — R0789 Other chest pain: Secondary | ICD-10-CM

## 2011-01-20 DIAGNOSIS — S29011A Strain of muscle and tendon of front wall of thorax, initial encounter: Secondary | ICD-10-CM

## 2011-01-20 HISTORY — DX: Strain of muscle and tendon of front wall of thorax, initial encounter: S29.011A

## 2011-01-20 MED ORDER — HYDROCODONE-ACETAMINOPHEN 5-325 MG PO TABS: 1.0000 | ORAL_TABLET | Freq: Four times a day (QID) | ORAL | Status: AC | PRN

## 2011-01-20 MED ORDER — CYCLOBENZAPRINE HCL 10 MG PO TABS: 10.0000 mg | ORAL_TABLET | Freq: Two times a day (BID) | ORAL | Status: AC | PRN

## 2011-01-20 MED ORDER — LIDOCAINE 5 % EX PTCH: 1.0000 | MEDICATED_PATCH | CUTANEOUS | Status: AC

## 2011-01-20 NOTE — Progress Notes (Deleted)
  Subjective:    Patient ID: Teresa Jensen, female    DOB: Jul 22, 1945, 66 y.o.   MRN: [id number]  HPI    Review of Systems     Objective:   Physical Exam        Assessment & Plan:

## 2011-01-20 NOTE — Patient Instructions (Signed)
Chest Wall Pain Chest wall pain is pain in or around the bones and muscles of your chest. This may occur:   On its own (spontaneously).   After a viral illness such as the flu.   Through injur.   From coughing.   Minor exercise.  It may take up to 6 weeks to get better; longer if you must stay physically active in your work and activities. HOME CARE INSTRUCTIONS   Avoid over-tiring physical activity. Try not to strain or perform activities which cause pain. This would include any activities using chest, belly (abdominal) and side muscles, especially if heavy weights are used.   Use ice on the painful area for 15 to 20 minutes per hour while awake for the first 2 days. Place the ice in a plastic bag and place a towel between the bag of ice and your skin.   Only take over-the-counter or prescription medicines for pain, discomfort, or fever as directed by your caregiver.  SEEK IMMEDIATE MEDICAL CARE IF:   Your pain increases or you are very uncomfortable.   An oral temperature above 102 F (38.9 C)develops.   Your chest pains become worse.   You develop new, unexplained problems (symptoms).   You develop nausea, vomiting, sweating or feel light headed.   You develop a cough which produces phlegm (sputum) or you cough up blood.  MAKE SURE YOU:   Understand these instructions.   Will watch your condition.   Will get help right away if you are not doing well or get worse.  Document Released: 12/18/2004 Document Revised: 07/03/2010 Document Reviewed: 08/06/2007 Exit[employer] Patient Information 2012 [employer]. 

## 2011-01-20 NOTE — Assessment & Plan Note (Signed)
Patient with sneezing last night very vigorously and had a sharp pain in the knees. Since then she's been tender over her left upper chest wall. It is worse with movement deep breathing and coughing. She has point tenderness noted over her left upper anterior chest wall reproducible. From just below the clavicle to the sternum. She is given a Lidoderm patch to place the office and given one more for tomorrow. She is already on a course of steroids for her myalgia so we will not add an incentive. He is given a handful of Norco to use the pain is severe. Should she develop worsening symptoms such as palpitations shortness of breath or any other associated symptoms such as diaphoresis she will seek immediate care otherwise she will try alternating heat and ice this weekend if her symptoms are not improving she will get followup care

## 2011-01-20 NOTE — Progress Notes (Signed)
Margaret Cockerill Schimming [id number] 1945/07/16 01/20/2011      Progress Note-Follow Up  Subjective  Chief Complaint  No chief complaint on file.   HPI  Patient is a 66 year old Caucasian female who was home last night her usual state of health when she had a sneezing fit and had a sudden sharp pain in her left upper chest wall. She denies having any recent illness, fevers, chills, previous chest pain, shortness of breath, congestion. She denies any other symptoms presently except for chest pain. The chest pain is reproducible with palpation position changes coughing and deep breathing. No diaphoresis nausea palpitations radiation of pain. She's never had a similar pain. No GI or GU complaints today.  Past Medical History  Diagnosis Date  . Anxiety   . COPD (chronic obstructive pulmonary disease)   . GERD (gastroesophageal reflux disease)   . Tobacco abuse   . Osteopenia   . Strain of chest wall 01/20/2011    Past Surgical History  Procedure Date  . Abdominal hysterectomy   . Lumbar fusion   . Appendectomy   . Cholecystectomy   . Hemorrhoid surgery   . Tubal ligation     Family History  Problem Relation Age of Onset  . Heart disease Mother   . Cancer Father     lung/chest wall and prostate  . Diabetes      siblings  . Hypertension      siblings  . Heart disease Brother     History   Social History  . Marital Status: Married    Spouse Name: N/A    Number of Children: 1  . Years of Education: N/A   Occupational History  . [employer] as a Tourist information centre manager     retired   Social History Main Topics  . Smoking status: Former Smoker -- 1.5 packs/day for 40 years    Types: Cigarettes    Quit date: 02/11/2009  . Smokeless tobacco: Never Used  . Alcohol Use: Not on file  . Drug Use: Not on file  . Sexually Active: Not on file   Other Topics Concern  . Not on file   Social History Narrative   Lives in [place] with her husband.  Takes care of her two grandchildren,  ages 35 and 2, everyday in [place].  Has one son who is 11.    Current Outpatient Prescriptions on File Prior to Visit  Medication Sig Dispense Refill  . albuterol (PROVENTIL HFA;VENTOLIN HFA) 108 (90 BASE) MCG/ACT inhaler Inhale 2 puffs into the lungs every 6 (six) hours as needed for wheezing.  1 Inhaler  1  . ergocalciferol (VITAMIN D2) 50000 UNITS capsule Take 50,000 Units by mouth once a week.        . levothyroxine (SYNTHROID, LEVOTHROID) 75 MCG tablet Take 1 tablet (75 mcg total) by mouth daily.  30 tablet  6    Allergies  Allergen Reactions  . Prednisone     REACTION: hives, difficulty breathing, facial swelling.     Review of Systems  Review of Systems  Constitutional: Negative for fever and malaise/fatigue.  HENT: Negative for congestion.   Eyes: Negative for discharge.  Respiratory: Negative for shortness of breath.   Cardiovascular: Positive for chest pain. Negative for palpitations, orthopnea, claudication, leg swelling and PND.       Occurred suddenly with sneezing last night. Left upper chest wall sharp and worsens with position changes, palpation and coughing.  Gastrointestinal: Negative for nausea, abdominal pain and diarrhea.  Genitourinary:  Negative for dysuria.  Musculoskeletal: Negative for falls.  Skin: Negative for rash.  Neurological: Negative for loss of consciousness and headaches.  Endo/Heme/Allergies: Negative for polydipsia.  Psychiatric/Behavioral: Negative for depression and suicidal ideas. The patient is not nervous/anxious and does not have insomnia.     Objective  BP 132/72  Temp(Src) 97.4 F (36.3 C) (Oral)  Wt 182 lb (82.555 kg)  Physical Exam  Physical Exam  Constitutional: She is oriented to person, place, and time and well-developed, well-nourished, and in no distress. No distress.  HENT:  Head: Normocephalic and atraumatic.  Eyes: Conjunctivae are normal.  Neck: Neck supple. No thyromegaly present.  Cardiovascular: Normal  rate, regular rhythm and normal heart sounds.   No murmur heard. Pulmonary/Chest: Effort normal and breath sounds normal. No respiratory distress. She has no wheezes. She has no rales. She exhibits tenderness.       Tender with palpation from just below left distal clavicle to sternum.  Abdominal: She exhibits no distension and no mass.  Musculoskeletal: She exhibits no edema.  Lymphadenopathy:    She has no cervical adenopathy.  Neurological: She is alert and oriented to person, place, and time.  Skin: Skin is warm and dry. No rash noted. She is not diaphoretic.  Psychiatric: Memory, affect and judgment normal.    Lab Results  Component Value Date   TSH 1.98 04/24/2010   Lab Results  Component Value Date   WBC 10.0 03/28/2008   HGB 15.3* 03/28/2008   HCT 45.0 03/28/2008   MCV 90.9 03/28/2008   PLT 337 03/28/2008   Lab Results  Component Value Date   CREATININE 0.7 12/06/2010   BUN 10 12/06/2010   NA 139 12/06/2010   K 4.1 12/06/2010   CL 105 12/06/2010   CO2 25 12/06/2010   No results found for this basename: ALT, AST, GGT, ALKPHOS, BILITOT   Lab Results  Component Value Date   CHOL 147 04/24/2010   Lab Results  Component Value Date   HDL 42.60 04/24/2010   Lab Results  Component Value Date   LDLCALC 87 04/24/2010   Lab Results  Component Value Date   TRIG 87.0 04/24/2010   Lab Results  Component Value Date   CHOLHDL 3 04/24/2010     Assessment & Plan  Strain of chest wall Patient with sneezing last night very vigorously and had a sharp pain in the knees. Since then she's been tender over her left upper chest wall. It is worse with movement deep breathing and coughing. She has point tenderness noted over her left upper anterior chest wall reproducible. From just below the clavicle to the sternum. She is given a Lidoderm patch to place the office and given one more for tomorrow. She is already on a course of steroids for her myalgia so we will not add an incentive. He is  given a handful of Norco to use the pain is severe. Should she develop worsening symptoms such as palpitations shortness of breath or any other associated symptoms such as diaphoresis she will seek immediate care otherwise she will try alternating heat and ice this weekend if her symptoms are not improving she will get followup care

## 2011-03-07 ENCOUNTER — Encounter: Payer: Self-pay | Admitting: Family Medicine

## 2011-03-07 ENCOUNTER — Ambulatory Visit (INDEPENDENT_AMBULATORY_CARE_PROVIDER_SITE_OTHER): Payer: Medicare Other | Admitting: Family Medicine

## 2011-03-07 VITALS — BP 160/70 | HR 80 | Temp 98.3°F | Wt 188.0 lb

## 2011-03-07 DIAGNOSIS — M7989 Other specified soft tissue disorders: Secondary | ICD-10-CM | POA: Insufficient documentation

## 2011-03-07 DIAGNOSIS — J40 Bronchitis, not specified as acute or chronic: Secondary | ICD-10-CM

## 2011-03-07 MED ORDER — AZITHROMYCIN 250 MG PO TABS: ORAL_TABLET | ORAL | Status: AC

## 2011-03-07 NOTE — Progress Notes (Signed)
SUBJECTIVE:  Teresa Jensen is a 66 y.o. female who complains of:   1.  URI- congestion, sore throat, right sinus pain and right ear pain for 10 days. She denies a history of anorexia, chest pain, chills, dizziness, fatigue, nausea and shortness of breath and has a history of asthma. Patient denies smoke cigarettes.  2.  Right forearm swelling- Being treated for PMR- followed by rheum.  Weaning off Medrol. Over past few days, noticed swelling in her right forearm.  Per pt, asked rheumatologist and was told this should not be related to her PMR. Not painful, no erythema or warmth. No SOB.    Patient Active Problem List  Diagnoses  . Unspecified hypothyroidism  . ANXIETY  . GERD  . CALLUS, RIGHT FOOT  . ANKLE PAIN, RIGHT  . SCIATICA, ACUTE  . ROTATOR CUFF INJURY, LEFT SHOULDER  . OSTEOPENIA  . HEADACHE  . Routine general medical examination at a health care facility  . COPD (chronic obstructive pulmonary disease)  . Cough  . Nevus  . Adjustment disorder  . Plantar fascia syndrome  . Bronchitis  . Myalgia and myositis  . Sedimentation rate elevation  . Strain of chest wall   Past Medical History  Diagnosis Date  . Anxiety   . COPD (chronic obstructive pulmonary disease)   . GERD (gastroesophageal reflux disease)   . Tobacco abuse   . Osteopenia   . Strain of chest wall 01/20/2011   Past Surgical History  Procedure Date  . Abdominal hysterectomy   . Lumbar fusion   . Appendectomy   . Cholecystectomy   . Hemorrhoid surgery   . Tubal ligation    History  Substance Use Topics  . Smoking status: Former Smoker -- 1.5 packs/day for 40 years    Types: Cigarettes    Quit date: 02/11/2009  . Smokeless tobacco: Never Used  . Alcohol Use: Not on file   Family History  Problem Relation Age of Onset  . Heart disease Mother   . Cancer Father     lung/chest wall and prostate  . Diabetes      siblings  . Hypertension      siblings  . Heart disease Brother     Allergies  Allergen Reactions  . Prednisone     REACTION: hives, difficulty breathing, facial swelling.    Current Outpatient Prescriptions on File Prior to Visit  Medication Sig Dispense Refill  . albuterol (PROVENTIL HFA;VENTOLIN HFA) 108 (90 BASE) MCG/ACT inhaler Inhale 2 puffs into the lungs every 6 (six) hours as needed for wheezing.  1 Inhaler  1  . ergocalciferol (VITAMIN D2) 50000 UNITS capsule Take 50,000 Units by mouth once a week.        . levothyroxine (SYNTHROID, LEVOTHROID) 75 MCG tablet Take 1 tablet (75 mcg total) by mouth daily.  30 tablet  6  . methylPREDNISolone (MEDROL) 4 MG tablet Take 4 mg by mouth daily.       The PMH, PSH, Social History, Family History, Medications, and allergies have been reviewed in [facility], and have been updated if relevant.  OBJECTIVE: BP 160/70  Pulse 80  Temp(Src) 98.3 F (36.8 C) (Oral)  Wt 188 lb (85.276 kg)  BP Readings from Last 3 Encounters:  03/07/11 160/70  01/20/11 132/72  12/06/10 140/80    She appears well, vital signs are as noted. Ears normal.  Throat and pharynx normal.  Neck supple. No adenopathy in the neck. Nose is congested. Sinuses TTP front, right >left.  The chest is clear, without wheezes or rales. Ext: swelling of medial aspect of right forearm, mildly TTP, normal pulses, no erythema or warmth.  ASSESSMENT?Plan:   1.sinusitis-  Given duration and progression of symptoms, will treat for bacterial sinusitis. Symptomatic therapy suggested: push fluids, rest and return office visit prn if symptoms persist or worsen.  Call or return to clinic prn if these symptoms worsen or fail to improve as anticipated.  2.  RUQ swelling- Likely due to swelling from Medrol.  Has had swelling to pred in past. Will get UE doppler to rule out DVT.

## 2011-03-07 NOTE — Patient Instructions (Signed)
Good to see you. Take antibiotic as directed.  Drink lots of fluids.  Treat sympotmatically with Mucinex, nasal saline irrigation, and Tylenol/Ibuprofen.  Please stop by to see Shirlee Limerick on your way out.

## 2011-03-09 ENCOUNTER — Ambulatory Visit
Admission: RE | Admit: 2011-03-09 | Discharge: 2011-03-09 | Disposition: A | Discharge status: Home / Self Care | Payer: Medicare Other | Source: Ambulatory Visit | Attending: Family Medicine | Admitting: Family Medicine

## 2011-03-09 DIAGNOSIS — M7989 Other specified soft tissue disorders: Secondary | ICD-10-CM

## 2011-03-29 ENCOUNTER — Encounter: Payer: Self-pay | Admitting: Family Medicine

## 2011-03-29 ENCOUNTER — Ambulatory Visit (INDEPENDENT_AMBULATORY_CARE_PROVIDER_SITE_OTHER): Payer: Medicare Other | Admitting: Family Medicine

## 2011-03-29 ENCOUNTER — Encounter: Payer: Self-pay | Admitting: Internal Medicine

## 2011-03-29 ENCOUNTER — Encounter: Payer: Self-pay | Admitting: Gastroenterology

## 2011-03-29 VITALS — BP 144/86 | HR 80 | Temp 98.0°F | Wt 188.0 lb

## 2011-03-29 DIAGNOSIS — R1310 Dysphagia, unspecified: Secondary | ICD-10-CM

## 2011-03-29 DIAGNOSIS — R05 Cough: Secondary | ICD-10-CM

## 2011-03-29 DIAGNOSIS — R252 Cramp and spasm: Secondary | ICD-10-CM

## 2011-03-29 DIAGNOSIS — R059 Cough, unspecified: Secondary | ICD-10-CM

## 2011-03-29 DIAGNOSIS — IMO0001 Reserved for inherently not codable concepts without codable children: Secondary | ICD-10-CM

## 2011-03-29 LAB — COMPREHENSIVE METABOLIC PANEL
ALT: 33 U/L (ref 0–35)
AST: 25 U/L (ref 0–37)
Albumin: 3.6 g/dL (ref 3.5–5.2)
Alkaline Phosphatase: 55 U/L (ref 39–117)
BUN: 12 mg/dL (ref 6–23)
CO2: 26 mEq/L (ref 19–32)
Calcium: 9 mg/dL (ref 8.4–10.5)
Chloride: 101 mEq/L (ref 96–112)
Creatinine, Ser: 0.8 mg/dL (ref 0.4–1.2)
GFR: 82.17 mL/min (ref >60.00)
Glucose, Bld: 93 mg/dL (ref 70–99)
Potassium: 3.6 mEq/L (ref 3.5–5.1)
Sodium: 139 mEq/L (ref 135–145)
Total Bilirubin: 0.2 mg/dL — ABNORMAL LOW (ref 0.3–1.2)
Total Protein: 7.4 g/dL (ref 6.0–8.3)

## 2011-03-29 NOTE — Progress Notes (Signed)
SUBJECTIVE:  Teresa Jensen is a 66 y.o. female who complains of:   1.  Dysphagia- past several months, getting "choked" on liquids and solids.  The other day, had to reach in her throat and pull out food. No nausea or vomiting. No abdominal pain or changes in her stool. She does have a h/o prior smoking.   Does not drink ETOH.  2.  Leg cramps- Being treated for PMR- followed by rheum.  Weaning off Medrol but having persistent leg cramps. Lab Results  Component Value Date   NA 139 12/06/2010   K 4.1 12/06/2010   CL 105 12/06/2010   CO2 25 12/06/2010       Patient Active Problem List  Diagnoses  . Unspecified hypothyroidism  . ANXIETY  . GERD  . CALLUS, RIGHT FOOT  . ANKLE PAIN, RIGHT  . SCIATICA, ACUTE  . ROTATOR CUFF INJURY, LEFT SHOULDER  . OSTEOPENIA  . HEADACHE  . Routine general medical examination at a health care facility  . COPD (chronic obstructive pulmonary disease)  . Cough  . Nevus  . Adjustment disorder  . Plantar fascia syndrome  . Bronchitis  . Myalgia and myositis  . Sedimentation rate elevation  . Strain of chest wall  . Arm swelling   Past Medical History  Diagnosis Date  . Anxiety   . COPD (chronic obstructive pulmonary disease)   . GERD (gastroesophageal reflux disease)   . Tobacco abuse   . Osteopenia   . Strain of chest wall 01/20/2011   Past Surgical History  Procedure Date  . Abdominal hysterectomy   . Lumbar fusion   . Appendectomy   . Cholecystectomy   . Hemorrhoid surgery   . Tubal ligation    History  Substance Use Topics  . Smoking status: Former Smoker -- 1.5 packs/day for 40 years    Types: Cigarettes    Quit date: 02/11/2009  . Smokeless tobacco: Never Used  . Alcohol Use: Not on file   Family History  Problem Relation Age of Onset  . Heart disease Mother   . Cancer Father     lung/chest wall and prostate  . Diabetes      siblings  . Hypertension      siblings  . Heart disease Brother    Allergies  Allergen  Reactions  . Prednisone     REACTION: hives, difficulty breathing, facial swelling.    Current Outpatient Prescriptions on File Prior to Visit  Medication Sig Dispense Refill  . albuterol (PROVENTIL HFA;VENTOLIN HFA) 108 (90 BASE) MCG/ACT inhaler Inhale 2 puffs into the lungs every 6 (six) hours as needed for wheezing.  1 Inhaler  1  . alendronate (FOSAMAX) 70 MG tablet Take 70 mg by mouth every 7 (seven) days. Take with a full glass of water on an empty stomach.      . ergocalciferol (VITAMIN D2) 50000 UNITS capsule Take 50,000 Units by mouth once a week.        . levothyroxine (SYNTHROID, LEVOTHROID) 75 MCG tablet Take 1 tablet (75 mcg total) by mouth daily.  30 tablet  6  . methylPREDNISolone (MEDROL) 4 MG tablet Take 4 mg by mouth daily.       The PMH, PSH, Social History, Family History, Medications, and allergies have been reviewed in [facility], and have been updated if relevant.  OBJECTIVE: BP 144/86  Pulse 80  Temp(Src) 98 F (36.7 C) (Oral)  Wt 188 lb (85.276 kg)   BP Readings from Last 3  Encounters:  03/07/11 160/70  01/20/11 132/72  12/06/10 140/80     General:  Well-developed,well-nourished,in no acute distress; alert,appropriate and cooperative throughout examination, moon facies Head:  normocephalic and atraumatic.   Eyes:  vision grossly intact, pupils equal, pupils round, and pupils reactive to light.   Ears:  R ear normal and L ear normal.   Nose:  no external deformity.   Mouth:  good dentition.   Neck:  No deformities, masses, or tenderness noted. Lungs:  Normal respiratory effort, chest expands symmetrically. Lungs are clear to auscultation, no crackles or wheezes. Heart:  Normal rate and regular rhythm. S1 and S2 normal without gallop, murmur, click, rub or other extra sounds. Abdomen:  Bowel sounds positive,abdomen soft and non-tender without masses, organomegaly or hernias noted. Msk:  No deformity or scoliosis noted of thoracic or lumbar spine.   Extremities:   No clubbing, cyanosis, edema, or deformity noted with normal full range of motion of all joints.   Skin:  Intact without suspicious lesions or rashes Psych:  Cognition and judgment appear intact. Alert and cooperative with normal attention span and concentration. No apparent delusions, illusions, hallucinations   ASSESSMENT/Plan:  1. Difficulty swallowing  With foods and liquids. ?esophageal stricture, also at risk for esophageal malignancy. Will refer to Gastroenterology for further work up. Ambulatory referral to Gastroenterology  2. Leg cramps  New- likely related to myositis but will check lab work today. Comprehensive metabolic panel

## 2011-03-29 NOTE — Patient Instructions (Signed)
Good to see you, Ms. Lick. Try to have a happy easter. Please call your rhematologist office to make an appointment when she gets back. Please stop by to see Shirlee Limerick on your way out.

## 2011-04-05 ENCOUNTER — Encounter: Payer: Self-pay | Admitting: Gastroenterology

## 2011-04-05 ENCOUNTER — Ambulatory Visit (INDEPENDENT_AMBULATORY_CARE_PROVIDER_SITE_OTHER): Payer: Medicare Other | Admitting: Gastroenterology

## 2011-04-05 ENCOUNTER — Telehealth: Payer: Self-pay | Admitting: Family Medicine

## 2011-04-05 VITALS — BP 126/70 | HR 80 | Ht <= 58 in | Wt 188.2 lb

## 2011-04-05 DIAGNOSIS — R1319 Other dysphagia: Secondary | ICD-10-CM

## 2011-04-05 DIAGNOSIS — M353 Polymyalgia rheumatica: Secondary | ICD-10-CM | POA: Insufficient documentation

## 2011-04-05 NOTE — Patient Instructions (Signed)
You have been scheduled for an endoscopy with propofol. Please follow written instructions given to you at your visit today. cc: [name], MD

## 2011-04-05 NOTE — Telephone Encounter (Signed)
Pt went to see Dr .Russella Dar for her Teresa Jensen problems and she says she didn't get along with him all that well and she didn't feel comfortable and she has set up with Dr. Loreta Ave for her appointments who has done her Colonoscopy before and wanted to let you know.

## 2011-04-05 NOTE — Progress Notes (Signed)
History of Present Illness: This is a 66 year old female who relates a 2 month history of difficulty swallowing solids and liquids. She has frequent choking and coughing associated with swallowing. He was recently diagnosed with polymyalgia rheumatica and was started on prednisolone which has been tapered. She was restarted Fosamax last month. She notes a fullness in her lower neck region ever since her cervical spine surgery. Denies weight loss, abdominal pain, constipation, diarrhea, change in stool caliber, melena, hematochezia, nausea, vomiting, reflux symptoms, chest pain.  Review of Systems: Pertinent positive and negative review of systems were noted in the above HPI section. All other review of systems were otherwise negative.  Current Medications, Allergies, Past Medical History, Past Surgical History, Family History and Social History were reviewed in [employer] record.  Physical Exam: General: Well developed , well nourished, no acute distress Head: Normocephalic and atraumatic Eyes:  sclerae anicteric, EOMI Ears: Normal auditory acuity Mouth: No deformity or lesions Neck: Supple, no masses or thyromegaly Lungs: Clear throughout to auscultation Heart: Regular rate and rhythm; no murmurs, rubs or bruits Abdomen: Soft, non tender and non distended. No masses, hepatosplenomegaly or hernias noted. Normal Bowel sounds Musculoskeletal: Symmetrical with no gross deformities  Skin: No lesions on visible extremities Pulses:  Normal pulses noted Extremities: No clubbing, cyanosis, edema or deformities noted Neurological: Alert oriented x 4, grossly nonfocal Cervical Nodes:  No significant cervical adenopathy Inguinal Nodes: No significant inguinal adenopathy Psychological:  Alert and cooperative. Normal mood and affect  Assessment and Recommendations:  1. Solid and liquid dysphagia. Rule out an esophageal stricture, esophagitis, esophageal neoplasm, esophageal  motility disorder, or pharyngeal disorder. Rule out esophagitis secondary to Fosamax. The risks, benefits, and alternatives to endoscopy with possible biopsy and possible dilation were discussed with the patient and they consent to proceed. The endoscopies unremarkable will proceed with a modified barium swallow study.

## 2011-04-05 NOTE — Telephone Encounter (Signed)
Noted.  I'm sorry she did not have a good experience.

## 2011-04-12 ENCOUNTER — Other Ambulatory Visit: Payer: Self-pay | Admitting: Gastroenterology

## 2011-04-16 ENCOUNTER — Ambulatory Visit
Admission: RE | Admit: 2011-04-16 | Discharge: 2011-04-16 | Disposition: A | Discharge status: Home / Self Care | Payer: Medicare Other | Source: Ambulatory Visit | Attending: Gastroenterology | Admitting: Gastroenterology

## 2011-04-23 ENCOUNTER — Encounter: Payer: Self-pay | Admitting: Internal Medicine

## 2011-04-24 ENCOUNTER — Ambulatory Visit: Payer: Medicare Other | Admitting: Internal Medicine

## 2011-05-02 ENCOUNTER — Encounter (INDEPENDENT_AMBULATORY_CARE_PROVIDER_SITE_OTHER): Payer: Medicare Other

## 2011-05-02 ENCOUNTER — Telehealth: Payer: Self-pay | Admitting: *Deleted

## 2011-05-02 ENCOUNTER — Encounter: Payer: Self-pay | Admitting: Family Medicine

## 2011-05-02 ENCOUNTER — Ambulatory Visit (INDEPENDENT_AMBULATORY_CARE_PROVIDER_SITE_OTHER): Payer: Medicare Other | Admitting: Family Medicine

## 2011-05-02 VITALS — BP 138/70 | HR 76 | Temp 98.1°F | Wt 189.0 lb

## 2011-05-02 DIAGNOSIS — Z87891 Personal history of nicotine dependence: Secondary | ICD-10-CM

## 2011-05-02 DIAGNOSIS — R2241 Localized swelling, mass and lump, right lower limb: Secondary | ICD-10-CM

## 2011-05-02 DIAGNOSIS — R229 Localized swelling, mass and lump, unspecified: Secondary | ICD-10-CM

## 2011-05-02 DIAGNOSIS — J069 Acute upper respiratory infection, unspecified: Secondary | ICD-10-CM

## 2011-05-02 MED ORDER — HYDROCOD POLST-CHLORPHEN POLST 10-8 MG/5ML PO LQCR: 5.0000 mL | Freq: Every evening | ORAL | Status: DC | PRN

## 2011-05-02 NOTE — Telephone Encounter (Signed)
Advised patient

## 2011-05-02 NOTE — Patient Instructions (Signed)
Good to see you. Please stop by to see Shirlee Limerick on your way out to set up your ultrasound. Your lungs sound good today- try Tussionex as needed at night to help with cough. Keep me posted with your symptoms.

## 2011-05-02 NOTE — Telephone Encounter (Signed)
Message copied by Eliezer Bottom on Wed May 02, 2011 12:21 PM ------      Message from: Dianne Dun      Created: Wed May 02, 2011 11:52 AM      Regarding: FW: Prelim PV       Please advise pt that her ultrasound was neg for DVT.      ----- Message -----         From: Milbert Coulter         Sent: 05/02/2011  11:48 AM           To: Dianne Dun, MD      Subject: Prelim PV                                                Venous duplex negative for DVT.

## 2011-05-02 NOTE — Progress Notes (Signed)
Subjective:    Patient ID: Teresa Jensen, female    DOB: [birth year], 66 y.o.   MRN: [id number]  HPI  66 yo with h/o COPD here for:  1.  1 week of cough.  No fevers.  No CP, mild sputum production in the morning. No wheezing or CP. Prior smoking history. Of note, weaning off pred per rheumatology for PMR.  Started MTX.  2.  Painful firm area on right lower leg- noticed it a week or so ago, painful to touch. Does seem more red then when she first noticed.  Does not itch.  No known trauma or bug bite.   Patient Active Problem List  Diagnoses  . Unspecified hypothyroidism  . ANXIETY  . GERD  . CALLUS, RIGHT FOOT  . ANKLE PAIN, RIGHT  . SCIATICA, ACUTE  . ROTATOR CUFF INJURY, LEFT SHOULDER  . OSTEOPENIA  . HEADACHE  . Routine general medical examination at a health care facility  . COPD (chronic obstructive pulmonary disease)  . Cough  . Nevus  . Adjustment disorder  . Plantar fascia syndrome  . Bronchitis  . Myalgia and myositis  . Sedimentation rate elevation  . Strain of chest wall  . Arm swelling  . Difficulty swallowing  . Leg cramps  . PMR (polymyalgia rheumatica)  . Leg mass, right   Past Medical History  Diagnosis Date  . Anxiety   . COPD (chronic obstructive pulmonary disease)   . GERD (gastroesophageal reflux disease)   . Tobacco abuse   . Osteopenia   . Strain of chest wall 01/20/2011  . PMR (polymyalgia rheumatica)    Past Surgical History  Procedure Date  . Abdominal hysterectomy   . Lumbar fusion   . Appendectomy   . Cholecystectomy   . Hemorrhoid surgery   . Tubal ligation    History  Substance Use Topics  . Smoking status: Former Smoker -- 1.5 packs/day for 40 years    Types: Cigarettes    Quit date: 02/11/2009  . Smokeless tobacco: Never Used  . Alcohol Use: No   Family History  Problem Relation Age of Onset  . Heart disease Mother   . Cancer Father     lung/chest wall   . Diabetes Brother     siblings  . Hypertension Sister      x 2  . Heart disease Brother   . Breast cancer Paternal Grandmother   . Heart disease Sister   . Diabetes Sister   . Tuberculosis Sister   . Prostate cancer Father   . Cirrhosis Brother    Allergies  Allergen Reactions  . Prednisone     REACTION: hives, difficulty breathing, facial swelling.    Current Outpatient Prescriptions on File Prior to Visit  Medication Sig Dispense Refill  . albuterol (PROVENTIL HFA;VENTOLIN HFA) 108 (90 BASE) MCG/ACT inhaler Inhale 2 puffs into the lungs every 6 (six) hours as needed for wheezing.  1 Inhaler  1  . alendronate (FOSAMAX) 70 MG tablet Take 70 mg by mouth every 7 (seven) days. Take with a full glass of water on an empty stomach.      Marland Kitchen aspirin 81 MG tablet Take 81 mg by mouth daily.      . Calcium Citrate-Vitamin D (GNP CALCIUM CITRATE +D3 PO) Take 1 tablet by mouth daily.      Marland Kitchen levothyroxine (SYNTHROID, LEVOTHROID) 75 MCG tablet Take 1 tablet (75 mcg total) by mouth daily.  30 tablet  6  . methylPREDNISolone (MEDROL) 4  MG tablet Take 4 mg by mouth daily.       The PMH, PSH, Social History, Family History, Medications, and allergies have been reviewed in [facility], and have been updated if relevant.    Review of Systems See HPI    Objective:   Physical Exam BP 138/70  Pulse 76  Temp(Src) 98.1 F (36.7 C) (Oral)  Wt 189 lb (85.73 kg)  General:  Well-developed,well-nourished,in no acute distress; alert,appropriate and cooperative throughout examination Head:  normocephalic and atraumatic.   Eyes:  vision grossly intact, pupils equal, pupils round, and pupils reactive to light.   Ears:  R ear normal and L ear normal.   Nose:  no external deformity.   Mouth:  good dentition.   Lungs:  Normal respiratory effort, chest expands symmetrically. Lungs are clear to auscultation, no crackles or wheezes. Heart:  Normal rate and regular rhythm. S1 and S2 normal without gallop, murmur, click, rub or other extra sounds. Neurologic:  alert &  oriented X3 and gait normal.   Ext:  Firm, erythematous 2 cm area on right lower leg, TTP Psych:  Cognition and judgment appear intact. Alert and cooperative with normal attention span and concentration. No apparent delusions, illusions, hallucinations    Assessment & Plan:   1. Leg mass, right  New- may be due to bug bite or venous changes but given that it is tender, will order venous dupplex to rule out DVT. The patient indicates understanding of these issues and agrees with the plan.  Lower Extremity Venous Reflux Right  2. Smoking history  Abdominal Aortic Aneurysm duplex  3. URI  Likely viral.  Lungs clear- advised supportive care. Rx given for tussionex for night time cough. The patient indicates understanding of these issues and agrees with the plan.

## 2011-05-15 ENCOUNTER — Encounter: Payer: Medicare Other | Admitting: Gastroenterology

## 2011-05-22 ENCOUNTER — Encounter: Payer: Self-pay | Admitting: Family Medicine

## 2011-05-22 ENCOUNTER — Ambulatory Visit (INDEPENDENT_AMBULATORY_CARE_PROVIDER_SITE_OTHER): Payer: Medicare Other | Admitting: Family Medicine

## 2011-05-22 VITALS — BP 168/80 | HR 86 | Temp 98.3°F | Wt 189.0 lb

## 2011-05-22 DIAGNOSIS — R609 Edema, unspecified: Secondary | ICD-10-CM | POA: Insufficient documentation

## 2011-05-22 LAB — BASIC METABOLIC PANEL
BUN: 8 mg/dL (ref 6–23)
CO2: 25 mEq/L (ref 19–32)
Calcium: 8.9 mg/dL (ref 8.4–10.5)
Chloride: 105 mEq/L (ref 96–112)
Creatinine, Ser: 0.6 mg/dL (ref 0.4–1.2)
GFR: 117.47 mL/min (ref >60.00)
Glucose, Bld: 78 mg/dL (ref 70–99)
Potassium: 4 mEq/L (ref 3.5–5.1)
Sodium: 141 mEq/L (ref 135–145)

## 2011-05-22 LAB — TSH: TSH: 1.19 u[IU]/mL (ref 0.35–5.50)

## 2011-05-22 MED ORDER — HYDROCHLOROTHIAZIDE 12.5 MG PO CAPS: 12.5000 mg | ORAL_CAPSULE | Freq: Every day | ORAL | Status: DC

## 2011-05-22 NOTE — Patient Instructions (Signed)
Your blood pressure is running high today - this could be contributing to the leg swelling Start HCTZ (hydrochlorothiazide) a fluid pill at 12.5mg  daily Keep track of blood pressure at home.  Bring 1 wk log prior to next appointment with Dr. Dayton Martes.  Follow up with Dr. Dayton Martes in 1 month. Good to see you today Blood work today. If any questions or concerns in the meantime, please call us.

## 2011-05-22 NOTE — Progress Notes (Signed)
  Subjective:    Patient ID: Teresa Jensen, female    DOB: [birth year], 66 y.o.   MRN: [id number]  HPI CC: ankle and foot swelling  H/o PMR  5-6d h/o swelling in feet and ankles.  Swelling lowest in am, but as day progresses swelling gets worse.  Thinks anterior legs a bit red.  bilateral swelling.  Denies leg pain.  No h/o leg swelling in past.  Stopped methylprednisolone 3 months ago for PMR.  Keeps dry cough.  Quit smoking 2 yrs.  No fevers/chills, SOB, chest pain, tightness,   No new medicines.  No increase in salt intake.  H/o hypothyroid on synthroid.  No change in levothyroxine. Lab Results  Component Value Date   TSH 1.98 04/24/2010  States more recently checked by Dr. Loreta Ave (04/2011) and normal.  Denies h/o HTN. BP Readings from Last 3 Encounters:  05/22/11 158/80  05/02/11 138/70  04/05/11 126/70    Wt Readings from Last 3 Encounters:  05/22/11 189 lb (85.73 kg)  05/02/11 189 lb (85.73 kg)  04/05/11 188 lb 3.2 oz (85.367 kg)    Review of Systems Per HPI    Objective:   Physical Exam  Nursing note and vitals reviewed. Constitutional: She appears well-developed and well-nourished. No distress.  HENT:  Head: Normocephalic and atraumatic.  Mouth/Throat: Oropharynx is clear and moist. No oropharyngeal exudate.  Eyes: Conjunctivae and EOM are normal. Pupils are equal, round, and reactive to light. No scleral icterus.  Neck: Normal range of motion. Neck supple. Carotid bruit is not present. No thyromegaly present.  Cardiovascular: Normal rate, regular rhythm, normal heart sounds and intact distal pulses.   No murmur heard. Pulmonary/Chest: Effort normal and breath sounds normal. No respiratory distress. She has no wheezes. She has no rales.       RLL crackles that clear with deep cough  Musculoskeletal: She exhibits edema (trace pedal edema).  Lymphadenopathy:    She has no cervical adenopathy.  Skin: Skin is warm and dry. No rash noted.       Assessment &  Plan:

## 2011-05-22 NOTE — Assessment & Plan Note (Signed)
With elevated BP in office. Start HCTZ 12.5mg  daily advised keep track of BP at home and bring log to appt in 1 mo with PCP. States already has echo scheduled for early June given extensive fmhx cardiac disease.

## 2011-06-05 ENCOUNTER — Encounter (INDEPENDENT_AMBULATORY_CARE_PROVIDER_SITE_OTHER): Payer: Medicare Other

## 2011-06-05 DIAGNOSIS — I700 Atherosclerosis of aorta: Secondary | ICD-10-CM

## 2011-06-05 DIAGNOSIS — Z87891 Personal history of nicotine dependence: Secondary | ICD-10-CM

## 2011-06-17 ENCOUNTER — Other Ambulatory Visit: Payer: Self-pay | Admitting: Family Medicine

## 2011-06-25 ENCOUNTER — Encounter: Payer: Self-pay | Admitting: Family Medicine

## 2011-06-25 ENCOUNTER — Ambulatory Visit (INDEPENDENT_AMBULATORY_CARE_PROVIDER_SITE_OTHER): Payer: Medicare Other | Admitting: Family Medicine

## 2011-06-25 VITALS — BP 144/70 | HR 72 | Temp 98.2°F | Wt 184.0 lb

## 2011-06-25 DIAGNOSIS — R0602 Shortness of breath: Secondary | ICD-10-CM

## 2011-06-25 DIAGNOSIS — M353 Polymyalgia rheumatica: Secondary | ICD-10-CM

## 2011-06-25 DIAGNOSIS — R609 Edema, unspecified: Secondary | ICD-10-CM

## 2011-06-25 DIAGNOSIS — L539 Erythematous condition, unspecified: Secondary | ICD-10-CM | POA: Insufficient documentation

## 2011-06-25 NOTE — Progress Notes (Signed)
Subjective:    Patient ID: Teresa Jensen, female    DOB: May 11, 1945, 66 y.o.   MRN: [id number]  HPI 66 yo with h/o PMR here for one month follow up.  Saw Dr. Reece Agar last month with 5-6d h/o swelling in feet and ankles.  Swelling lowest in am, but as day progresses swelling gets worse.  Stopped methylprednisolone 4 months ago for PMR.   She is now on MTX.  Since starting MTX, has noticed LE edema and redness of her legs, chest.   No fevers/chills.  She does complain of some SOB and "chest tightness," non exertional.  Started HCTZ 12.5 mg last month but only took it for a week because it caused fatigue.   Denies h/o HTN. BP Readings from Last 3 Encounters:  06/25/11 144/70  05/22/11 168/80  05/02/11 138/70    Patient Active Problem List  Diagnosis  . Unspecified hypothyroidism  . ANXIETY  . GERD  . CALLUS, RIGHT FOOT  . ANKLE PAIN, RIGHT  . SCIATICA, ACUTE  . ROTATOR CUFF INJURY, LEFT SHOULDER  . OSTEOPENIA  . HEADACHE  . Routine general medical examination at a health care facility  . COPD (chronic obstructive pulmonary disease)  . Cough  . Nevus  . Adjustment disorder  . Plantar fascia syndrome  . Bronchitis  . Myalgia and myositis  . Sedimentation rate elevation  . Strain of chest wall  . Arm swelling  . Difficulty swallowing  . Leg cramps  . PMR (polymyalgia rheumatica)  . Leg mass, right  . Dependent edema  . Erythema   Past Medical History  Diagnosis Date  . Anxiety   . COPD (chronic obstructive pulmonary disease)   . GERD (gastroesophageal reflux disease)   . Tobacco abuse   . Osteopenia   . Strain of chest wall 01/20/2011  . PMR (polymyalgia rheumatica)    Past Surgical History  Procedure Date  . Abdominal hysterectomy   . Lumbar fusion   . Appendectomy   . Cholecystectomy   . Hemorrhoid surgery   . Tubal ligation    History  Substance Use Topics  . Smoking status: Former Smoker -- 1.5 packs/day for 40 years    Types: Cigarettes    Quit  date: 02/11/2009  . Smokeless tobacco: Never Used  . Alcohol Use: No   Family History  Problem Relation Age of Onset  . Heart disease Mother   . Cancer Father     lung/chest wall   . Diabetes Brother     siblings  . Hypertension Sister     x 2  . Heart disease Brother   . Breast cancer Paternal Grandmother   . Heart disease Sister   . Diabetes Sister   . Tuberculosis Sister   . Prostate cancer Father   . Cirrhosis Brother    Allergies  Allergen Reactions  . Prednisone     REACTION: hives, difficulty breathing, facial swelling.    Current Outpatient Prescriptions on File Prior to Visit  Medication Sig Dispense Refill  . alendronate (FOSAMAX) 70 MG tablet Take 70 mg by mouth every 7 (seven) days. Take with a full glass of water on an empty stomach.      Marland Kitchen aspirin 81 MG tablet Take 81 mg by mouth every other day.       . Calcium Citrate-Vitamin D (GNP CALCIUM CITRATE +D3 PO) Take 1 tablet by mouth daily.      . folic acid (FOLVITE) 1 MG tablet Take 1 mg by  mouth daily.      Marland Kitchen levothyroxine (SYNTHROID, LEVOTHROID) 75 MCG tablet TAKE 1 TABLET BY MOUTH DAILY  30 tablet  6  . methotrexate (RHEUMATREX) 2.5 MG tablet Take 2.5 mg by mouth once a week. Caution:Chemotherapy. Protect from light.      Marland Kitchen DISCONTD: albuterol (PROVENTIL HFA;VENTOLIN HFA) 108 (90 BASE) MCG/ACT inhaler Inhale 2 puffs into the lungs every 6 (six) hours as needed for wheezing.  1 Inhaler  1   The PMH, PSH, Social History, Family History, Medications, and allergies have been reviewed in [facility], and have been updated if relevant.   Review of Systems Per HPI   No diaphoresis Dry cough- chronic Objective:   Physical Exam  BP 144/70  Pulse 72  Temp 98.2 F (36.8 C)  Wt 184 lb (83.462 kg)  Nursing note and vitals reviewed. Constitutional: She appears well-developed and well-nourished. No distress.  HENT:  Head: Normocephalic and atraumatic.  Mouth/Throat: Oropharynx is clear and moist. No oropharyngeal  exudate.  Eyes: Conjunctivae and EOM are normal. Pupils are equal, round, and reactive to light. No scleral icterus.  Neck: Normal range of motion. Neck supple. Carotid bruit is not present. No thyromegaly present.  Cardiovascular: Normal rate, regular rhythm, normal heart sounds and intact distal pulses.   No murmur heard. Pulmonary/Chest: Effort normal and breath sounds normal. No respiratory distress. She has no wheezes. She has no rales.  Musculoskeletal: She exhibits edema (trace pedal edema).  Lymphadenopathy:    She has no cervical adenopathy.  Skin:  Erythema of bilateral legs, chest    Assessment & Plan:   1. Dependent edema  Likely multifactorial- dependent edema- she is trying to remain active, also ?due to side effects from MTX. 2D Echocardiogram without contrast  2. PMR (polymyalgia rheumatica)  Deteriorated.  Will refer to another rheumatologist for a second opinion per pt request. Ambulatory referral to Rheumatology  3. Shortness of breath  Unclear etiology- does have h/o previous tobacco abuse and COPD.  Chronic cough. Does have strong family h/o CAD.  EKD- NSR reassuring.  Will check echo due to swelling. EKG 12-Lead  4. Erythema  New- most likely due to MTX- known side effect.

## 2011-06-25 NOTE — Patient Instructions (Addendum)
Good to see you. Hang in there. Please stop by to see Marion on your way out. 

## 2011-06-28 ENCOUNTER — Telehealth: Payer: Self-pay | Admitting: *Deleted

## 2011-06-28 ENCOUNTER — Ambulatory Visit (HOSPITAL_COMMUNITY): Payer: Medicare Other | Attending: Cardiology

## 2011-06-28 DIAGNOSIS — I5030 Unspecified diastolic (congestive) heart failure: Secondary | ICD-10-CM

## 2011-06-28 DIAGNOSIS — R0609 Other forms of dyspnea: Secondary | ICD-10-CM | POA: Insufficient documentation

## 2011-06-28 DIAGNOSIS — J449 Chronic obstructive pulmonary disease, unspecified: Secondary | ICD-10-CM | POA: Insufficient documentation

## 2011-06-28 DIAGNOSIS — R609 Edema, unspecified: Secondary | ICD-10-CM

## 2011-06-28 DIAGNOSIS — J4489 Other specified chronic obstructive pulmonary disease: Secondary | ICD-10-CM | POA: Insufficient documentation

## 2011-06-28 DIAGNOSIS — I079 Rheumatic tricuspid valve disease, unspecified: Secondary | ICD-10-CM | POA: Insufficient documentation

## 2011-06-28 DIAGNOSIS — I359 Nonrheumatic aortic valve disorder, unspecified: Secondary | ICD-10-CM | POA: Insufficient documentation

## 2011-06-28 DIAGNOSIS — R0989 Other specified symptoms and signs involving the circulatory and respiratory systems: Secondary | ICD-10-CM | POA: Insufficient documentation

## 2011-06-28 HISTORY — DX: Unspecified diastolic (congestive) heart failure: I50.30

## 2011-06-28 NOTE — Progress Notes (Signed)
Echocardiogram performed.  

## 2011-06-28 NOTE — Telephone Encounter (Signed)
Pt states she is scheduled to have a colonoscopy tomorrow and mentioned to the people at that office that she is having a problem with swelling, thought to be caused by her meds.  She asked if Dr. Loreta Ave may have called to speak with Dr. Dayton Martes, advised her not that I know of, no notes in chart.  She said she will assume that she should still show up for the procedure tomorrow, advised yes.

## 2011-06-29 LAB — HM COLONOSCOPY

## 2011-07-03 ENCOUNTER — Encounter: Payer: Self-pay | Admitting: Family Medicine

## 2011-08-24 ENCOUNTER — Ambulatory Visit: Payer: Medicare Other | Admitting: Family Medicine

## 2011-08-29 ENCOUNTER — Ambulatory Visit (INDEPENDENT_AMBULATORY_CARE_PROVIDER_SITE_OTHER): Payer: Medicare Other | Admitting: Family Medicine

## 2011-08-29 ENCOUNTER — Encounter: Payer: Self-pay | Admitting: Family Medicine

## 2011-08-29 VITALS — BP 142/70 | HR 76 | Temp 97.8°F | Wt 182.0 lb

## 2011-08-29 DIAGNOSIS — R5381 Other malaise: Secondary | ICD-10-CM

## 2011-08-29 DIAGNOSIS — IMO0001 Reserved for inherently not codable concepts without codable children: Secondary | ICD-10-CM

## 2011-08-29 DIAGNOSIS — R5383 Other fatigue: Secondary | ICD-10-CM | POA: Insufficient documentation

## 2011-08-29 LAB — COMPREHENSIVE METABOLIC PANEL
ALT: 27 U/L (ref 0–35)
AST: 30 U/L (ref 0–37)
Albumin: 3.8 g/dL (ref 3.5–5.2)
Alkaline Phosphatase: 55 U/L (ref 39–117)
BUN: 10 mg/dL (ref 6–23)
CO2: 26 mEq/L (ref 19–32)
Calcium: 9.3 mg/dL (ref 8.4–10.5)
Chloride: 103 mEq/L (ref 96–112)
Creatinine, Ser: 0.7 mg/dL (ref 0.4–1.2)
GFR: 87.42 mL/min (ref >60.00)
Glucose, Bld: 123 mg/dL — ABNORMAL HIGH (ref 70–99)
Potassium: 4.1 mEq/L (ref 3.5–5.1)
Sodium: 138 mEq/L (ref 135–145)
Total Bilirubin: 0.7 mg/dL (ref 0.3–1.2)
Total Protein: 7.8 g/dL (ref 6.0–8.3)

## 2011-08-29 LAB — CBC WITH DIFFERENTIAL/PLATELET
Basophils Absolute: 0.1 10*3/uL (ref 0.0–0.1)
Basophils Relative: 0.6 % (ref 0.0–3.0)
Eosinophils Absolute: 0.1 10*3/uL (ref 0.0–0.7)
Eosinophils Relative: 0.5 % (ref 0.0–5.0)
HCT: 41.7 % (ref 36.0–46.0)
Hemoglobin: 13.7 g/dL (ref 12.0–15.0)
Lymphocytes Relative: 27.4 % (ref 12.0–46.0)
Lymphs Abs: 2.9 10*3/uL (ref 0.7–4.0)
MCHC: 32.9 g/dL (ref 30.0–36.0)
MCV: 91.9 fl (ref 78.0–100.0)
Monocytes Absolute: 1 10*3/uL (ref 0.1–1.0)
Monocytes Relative: 10 % (ref 3.0–12.0)
Neutro Abs: 6.5 10*3/uL (ref 1.4–7.7)
Neutrophils Relative %: 61.5 % (ref 43.0–77.0)
Platelets: 338 10*3/uL (ref 150.0–400.0)
RBC: 4.54 Mil/uL (ref 3.87–5.11)
RDW: 14.4 % (ref 11.5–14.6)
WBC: 10.5 10*3/uL (ref 4.5–10.5)

## 2011-08-29 LAB — TSH: TSH: 1.65 u[IU]/mL (ref 0.35–5.50)

## 2011-08-29 MED ORDER — TRAMADOL HCL 50 MG PO TABS: 50.0000 mg | ORAL_TABLET | Freq: Three times a day (TID) | ORAL | Status: AC | PRN

## 2011-08-29 MED ORDER — ALPRAZOLAM 0.25 MG PO TABS: 0.2500 mg | ORAL_TABLET | Freq: Three times a day (TID) | ORAL | Status: DC | PRN

## 2011-08-29 NOTE — Progress Notes (Signed)
SUBJECTIVE:  Teresa Jensen is a 66 y.o. female who complains of:   1. Upper arm pain- bilateral - right > left. Was being followed by rheum, Dr. Gilda Crease for PMR. Now seeing Dr. Kellie Simmering- note from last week reviewed. He does NOT feels she has PMR. He feels symptoms are more consistent with fibromylagia. Started her on Ambien- she couldn't tolerate and SOMA- she was afraid to take. Still having muscle aches.  2.  Fatigue- more fatigued and having red splotches on her skin- mainly on her ankles.     Patient Active Problem List  Diagnosis  . Unspecified hypothyroidism  . ANXIETY  . GERD  . CALLUS, RIGHT FOOT  . ANKLE PAIN, RIGHT  . SCIATICA, ACUTE  . ROTATOR CUFF INJURY, LEFT SHOULDER  . OSTEOPENIA  . HEADACHE  . Routine general medical examination at a health care facility  . COPD (chronic obstructive pulmonary disease)  . Cough  . Nevus  . Adjustment disorder  . Plantar fascia syndrome  . Bronchitis  . Myalgia and myositis  . Sedimentation rate elevation  . Strain of chest wall  . Arm swelling  . Difficulty swallowing  . Leg cramps  . PMR (polymyalgia rheumatica)  . Leg mass, right  . Dependent edema  . Erythema  . Fatigue   Past Medical History  Diagnosis Date  . Anxiety   . COPD (chronic obstructive pulmonary disease)   . GERD (gastroesophageal reflux disease)   . Tobacco abuse   . Osteopenia   . Strain of chest wall 01/20/2011  . PMR (polymyalgia rheumatica)    Past Surgical History  Procedure Date  . Abdominal hysterectomy   . Lumbar fusion   . Appendectomy   . Cholecystectomy   . Hemorrhoid surgery   . Tubal ligation    History  Substance Use Topics  . Smoking status: Former Smoker -- 1.5 packs/day for 40 years    Types: Cigarettes    Quit date: 02/11/2009  . Smokeless tobacco: Never Used  . Alcohol Use: No   Family History  Problem Relation Age of Onset  . Heart disease Mother   . Cancer Father     lung/chest wall   . Diabetes Brother       siblings  . Hypertension Sister     x 2  . Heart disease Brother   . Breast cancer Paternal Grandmother   . Heart disease Sister   . Diabetes Sister   . Tuberculosis Sister   . Prostate cancer Father   . Cirrhosis Brother    Allergies  Allergen Reactions  . Prednisone     REACTION: hives, difficulty breathing, facial swelling.    Current Outpatient Prescriptions on File Prior to Visit  Medication Sig Dispense Refill  . albuterol (PROVENTIL HFA;VENTOLIN HFA) 108 (90 BASE) MCG/ACT inhaler Inhale 2 puffs into the lungs every 6 (six) hours as needed.      Marland Kitchen alendronate (FOSAMAX) 70 MG tablet Take 70 mg by mouth every 7 (seven) days. Take with a full glass of water on an empty stomach.      Marland Kitchen aspirin 81 MG tablet Take 81 mg by mouth every other day.       . Calcium Citrate-Vitamin D (GNP CALCIUM CITRATE +D3 PO) Take 1 tablet by mouth daily.      . folic acid (FOLVITE) 1 MG tablet Take 1 mg by mouth daily.      Marland Kitchen levothyroxine (SYNTHROID, LEVOTHROID) 75 MCG tablet TAKE 1 TABLET BY MOUTH  DAILY  30 tablet  6  . methotrexate (RHEUMATREX) 2.5 MG tablet Take 2.5 mg by mouth once a week. Caution:Chemotherapy. Protect from light.      Marland Kitchen omeprazole (PRILOSEC) 20 MG capsule Take 20 mg by mouth daily.       The PMH, PSH, Social History, Family History, Medications, and allergies have been reviewed in [facility], and have been updated if relevant.  OBJECTIVE: BP 142/70  Pulse 76  Temp 97.8 F (36.6 C)  Wt 182 lb (82.555 kg)   BP Readings from Last 3 Encounters:  08/29/11 142/70  06/25/11 144/70  05/22/11 168/80     General:  Well-developed,well-nourished,in no acute distress; alert,appropriate and cooperative throughout examination, moon facies Head:  normocephalic and atraumatic.   Eyes:  vision grossly intact, pupils equal, pupils round, and pupils reactive to light.   Ears:  R ear normal and L ear normal.   Nose:  no external deformity.   Mouth:  good dentition.   Neck:  No  deformities, masses, or tenderness noted. Lungs:  Normal respiratory effort, chest expands symmetrically. Lungs are clear to auscultation, no crackles or wheezes. Heart:  Normal rate and regular rhythm. S1 and S2 normal without gallop, murmur, click, rub or other extra sounds. Abdomen:  Bowel sounds positive,abdomen soft and non-tender without masses, organomegaly or hernias noted. Msk:  No deformity or scoliosis noted of thoracic or lumbar spine.   Extremities:  No clubbing, cyanosis, edema, or deformity noted with normal full range of motion of all joints.   Good pulses. Skin:   Red non raised areas on ankles bilaterally, Psych:  Cognition and judgment appear intact. Alert and cooperative with normal attention span and concentration. No apparent delusions, illusions, hallucinations   ASSESSMENT/Plan:  1. Fatigue  Likely multifactorial with fibromyalgia playing a roll. Will check labs to rule out other possible factors.  CBC with Differential, TSH, Comprehensive metabolic panel  2. Myalgia and myositis  D/c ambien and soma. rx sent for tramadol. Advised trying to stay active. The patient indicates understanding of these issues and agrees with the plan.     3.  Erythema of ankles- Not consistent with venous stasis. ?dermatitis- try clobetasol. If labs normal and no improvement, consider skin biopsy.

## 2011-08-29 NOTE — Patient Instructions (Addendum)
Good to see you. We will call you with your lab results. Try tramadol as needed for pain.

## 2011-08-31 ENCOUNTER — Other Ambulatory Visit: Payer: Self-pay | Admitting: Family Medicine

## 2011-08-31 MED ORDER — CLOBETASOL PROPIONATE 0.05 % EX CREA: TOPICAL_CREAM | Freq: Two times a day (BID) | CUTANEOUS | Status: DC

## 2011-10-15 ENCOUNTER — Telehealth: Payer: Self-pay | Admitting: *Deleted

## 2011-10-15 DIAGNOSIS — L309 Dermatitis, unspecified: Secondary | ICD-10-CM

## 2011-10-15 NOTE — Telephone Encounter (Signed)
Yes will refer to derm.  Order placed.

## 2011-10-15 NOTE — Telephone Encounter (Signed)
Advised patient, she states she has dermatologist, she will call them for appt.

## 2011-10-15 NOTE — Telephone Encounter (Signed)
Pt's dermatitis is not any better and she called to make an appt for you to biopsy.  Do you want to do that or refer to derm?  Please advise.

## 2011-12-28 ENCOUNTER — Other Ambulatory Visit: Payer: Self-pay | Admitting: Family Medicine

## 2011-12-28 ENCOUNTER — Encounter: Payer: Self-pay | Admitting: Family Medicine

## 2011-12-28 ENCOUNTER — Ambulatory Visit (INDEPENDENT_AMBULATORY_CARE_PROVIDER_SITE_OTHER): Payer: Medicare Other | Admitting: Family Medicine

## 2011-12-28 VITALS — BP 130/70 | HR 90 | Temp 98.0°F | Ht 59.0 in | Wt 181.0 lb

## 2011-12-28 DIAGNOSIS — IMO0002 Reserved for concepts with insufficient information to code with codable children: Secondary | ICD-10-CM

## 2011-12-28 DIAGNOSIS — M259 Joint disorder, unspecified: Secondary | ICD-10-CM

## 2011-12-28 DIAGNOSIS — M75100 Unspecified rotator cuff tear or rupture of unspecified shoulder, not specified as traumatic: Secondary | ICD-10-CM

## 2011-12-28 DIAGNOSIS — M67919 Unspecified disorder of synovium and tendon, unspecified shoulder: Secondary | ICD-10-CM

## 2011-12-28 DIAGNOSIS — M19019 Primary osteoarthritis, unspecified shoulder: Secondary | ICD-10-CM

## 2011-12-28 DIAGNOSIS — M7580 Other shoulder lesions, unspecified shoulder: Secondary | ICD-10-CM

## 2011-12-28 DIAGNOSIS — M7550 Bursitis of unspecified shoulder: Secondary | ICD-10-CM

## 2011-12-28 MED ORDER — ALENDRONATE SODIUM 70 MG PO TABS: 70.0000 mg | ORAL_TABLET | ORAL | Status: DC

## 2011-12-28 NOTE — Progress Notes (Signed)
The patient noted above presents with shoulder pain that has been ongoing for 1 week. there is no history of trauma or accident recently The patient denies neck pain or radicular symptoms. Denies dislocation, subluxation, separation of the shoulder. The patient does complain of pain in the overhead plane with significant painful arc of motion. Painful in a T-shirt distribution.  Medications Tried: Tylenol, NSAIDS Ice or Heat: minimally helpful Tried PT: No  Prior shoulder Injury: No Prior surgery: cervical fusion 10-15 years ago, no shoulder surgery Prior fracture: No  The PMH, PSH, Social History, Family History, Medications, and allergies have been reviewed in [facility], and have been updated if relevant.  Patient Active Problem List  Diagnosis  . Unspecified hypothyroidism  . ANXIETY  . GERD  . CALLUS, RIGHT FOOT  . ANKLE PAIN, RIGHT  . SCIATICA, ACUTE  . ROTATOR CUFF INJURY, LEFT SHOULDER  . OSTEOPENIA  . HEADACHE  . Routine general medical examination at a health care facility  . COPD (chronic obstructive pulmonary disease)  . Cough  . Nevus  . Adjustment disorder  . Plantar fascia syndrome  . Bronchitis  . Myalgia and myositis  . Sedimentation rate elevation  . Strain of chest wall  . Arm swelling  . Difficulty swallowing  . Leg cramps  . PMR (polymyalgia rheumatica)  . Leg mass, right  . Dependent edema  . Erythema  . Fatigue    Past Medical History  Diagnosis Date  . Anxiety   . COPD (chronic obstructive pulmonary disease)   . GERD (gastroesophageal reflux disease)   . Tobacco abuse   . Osteopenia   . Strain of chest wall 01/20/2011  . PMR (polymyalgia rheumatica)     Past Surgical History  Procedure Date  . Abdominal hysterectomy   . Lumbar fusion   . Appendectomy   . Cholecystectomy   . Hemorrhoid surgery   . Tubal ligation   . Cervical fusion     History  Substance Use Topics  . Smoking status: Former Smoker -- 1.5 packs/day for 40 years   Types: Cigarettes    Quit date: 02/11/2009  . Smokeless tobacco: Never Used  . Alcohol Use: No    Family History  Problem Relation Age of Onset  . Heart disease Mother   . Cancer Father     lung/chest wall   . Diabetes Brother     siblings  . Hypertension Sister     x 2  . Heart disease Brother   . Breast cancer Paternal Grandmother   . Heart disease Sister   . Diabetes Sister   . Tuberculosis Sister   . Prostate cancer Father   . Cirrhosis Brother     Allergies  Allergen Reactions  . Prednisone     REACTION: hives, difficulty breathing, facial swelling.     Current Outpatient Prescriptions on File Prior to Visit  Medication Sig Dispense Refill  . aspirin 81 MG tablet Take 81 mg by mouth every other day.       . Calcium Citrate-Vitamin D (GNP CALCIUM CITRATE +D3 PO) Take 1 tablet by mouth daily.      . folic acid (FOLVITE) 1 MG tablet Take 1 mg by mouth daily.      Marland Kitchen levothyroxine (SYNTHROID, LEVOTHROID) 75 MCG tablet TAKE 1 TABLET BY MOUTH DAILY  30 tablet  6  . omeprazole (PRILOSEC) 20 MG capsule Take 20 mg by mouth daily.         REVIEW OF SYSTEMS  GEN:  No fevers, chills. Nontoxic. Primarily MSK c/o today. MSK: Detailed in the HPI GI: tolerating PO intake without difficulty Neuro: No numbness, parasthesias, or tingling associated. Otherwise the pertinent positives of the ROS are noted above.   PHYSICAL EXAM  Blood pressure 130/70, pulse 90, temperature 98 F (36.7 C), temperature source Oral, height 4\' 11"  (1.499 m), weight 181 lb (82.101 kg), SpO2 97.00%.  GEN: Well-developed,well-nourished,in no acute distress; alert,appropriate and cooperative throughout examination HEENT: Normocephalic and atraumatic without obvious abnormalities. Ears, externally no deformities PULM: Breathing comfortably in no respiratory distress EXT: No clubbing, cyanosis, or edema PSYCH: Normally interactive. Cooperative during the interview. Pleasant. Friendly and conversant. Not  anxious or depressed appearing. Normal, full affect.  Shoulder: R Inspection: No muscle wasting or winging Ecchymosis/edema: neg  AC joint, scapula, clavicle: AC TTP Cervical spine: NT, 15% loss of motion Spurling's: neg Abduction: full, 5/5 Flexion: full, 5/5 IR, full, lift-off: 5/5 ER at neutral: full, 5/5 AC crossover: pos Neer: pos Hawkins: pos Drop Test: neg Empty Can: pos Supraspinatus insertion: mild-mod T Bicipital groove: painful Speed's: pos Yergason's: pos Sulcus sign: neg Scapular dyskinesis: none C5-T1 intact  Neuro: Sensation intact Grip 5/5    1. Rotator cuff tendonitis   2. Subacromial bursitis   3. AC joint arthropathy    >25 minutes spent in face to face time with patient, >50% spent in counselling or coordination of care:  Spent in anatomy reviewed, and review of rotator cuff and scapular stabilization rehabilitation program. She is getting keep things blow the nose for about the next 2-3 weeks. Reviewed that the majority that time and the short lived cases they conservative care and rehabilitation will resolve symptoms. Aleve 2 tablets by mouth twice a day. If she is not improving in the next their 4 weeks, she can follow up with me to reassess.  Rotator cuff strengthening and scapular stabilization exercises were reviewed with the patient.  AAOS RTC and scapular stabilization program given to the patient. Retraining shoulder mechanics and function was emphasized to the patient with rehab done at least 5-6 days a week.  The patient could benefit from formal PT to assist with scapular stabilization and RTC strengthening.

## 2012-02-06 ENCOUNTER — Ambulatory Visit (INDEPENDENT_AMBULATORY_CARE_PROVIDER_SITE_OTHER): Payer: Medicare Other | Admitting: Family Medicine

## 2012-02-06 ENCOUNTER — Encounter: Payer: Self-pay | Admitting: Family Medicine

## 2012-02-06 VITALS — BP 150/72 | HR 92 | Temp 99.3°F | Wt 177.0 lb

## 2012-02-06 DIAGNOSIS — J40 Bronchitis, not specified as acute or chronic: Secondary | ICD-10-CM

## 2012-02-06 MED ORDER — AZITHROMYCIN 250 MG PO TABS: ORAL_TABLET | ORAL | Status: DC

## 2012-02-06 MED ORDER — HYDROCODONE-HOMATROPINE 5-1.5 MG/5ML PO SYRP: 5.0000 mL | ORAL_SOLUTION | Freq: Three times a day (TID) | ORAL | Status: DC | PRN

## 2012-02-06 NOTE — Patient Instructions (Addendum)
You have bronchitis. Take Zpack as directed.  Drink lots of fluids.  Treat sympotmatically with Mucinex, nasal saline irrigation, and Tylenol/Ibuprofen. A Cough suppressant at night. Call if not improving as expected in 5-7 days.  It was good to see you.

## 2012-02-06 NOTE — Progress Notes (Signed)
SUBJECTIVE:  Teresa Jensen is a 67 y.o. female who complains of coryza, congestion, sneezing, productive cough and fever for 10 days. She denies a history of anorexia and chest pain and admits to a history of asthma. Patient denies smoke cigarettes.   Patient Active Problem List  Diagnosis  . Unspecified hypothyroidism  . ANXIETY  . GERD  . SCIATICA, ACUTE  . ROTATOR CUFF INJURY, LEFT SHOULDER  . OSTEOPENIA  . HEADACHE  . Routine general medical examination at a health care facility  . COPD (chronic obstructive pulmonary disease)  . Cough  . Nevus  . Plantar fascia syndrome  . Bronchitis  . Myalgia and myositis  . Sedimentation rate elevation  . Strain of chest wall  . Difficulty swallowing  . Leg cramps  . PMR (polymyalgia rheumatica)  . Leg mass, right  . Dependent edema  . Erythema  . Fatigue   Past Medical History  Diagnosis Date  . Anxiety   . COPD (chronic obstructive pulmonary disease)   . GERD (gastroesophageal reflux disease)   . Tobacco abuse   . Osteopenia   . Strain of chest wall 01/20/2011  . PMR (polymyalgia rheumatica)    Past Surgical History  Procedure Date  . Abdominal hysterectomy   . Lumbar fusion   . Appendectomy   . Cholecystectomy   . Hemorrhoid surgery   . Tubal ligation   . Cervical fusion    History  Substance Use Topics  . Smoking status: Former Smoker -- 1.5 packs/day for 40 years    Types: Cigarettes    Quit date: 02/11/2009  . Smokeless tobacco: Never Used  . Alcohol Use: No   Family History  Problem Relation Age of Onset  . Heart disease Mother   . Cancer Father     lung/chest wall   . Diabetes Brother     siblings  . Hypertension Sister     x 2  . Heart disease Brother   . Breast cancer Paternal Grandmother   . Heart disease Sister   . Diabetes Sister   . Tuberculosis Sister   . Prostate cancer Father   . Cirrhosis Brother    Allergies  Allergen Reactions  . Prednisone     REACTION: hives, difficulty  breathing, facial swelling.    Current Outpatient Prescriptions on File Prior to Visit  Medication Sig Dispense Refill  . alendronate (FOSAMAX) 70 MG tablet Take 1 tablet (70 mg total) by mouth every 7 (seven) days. Take with a full glass of water on an empty stomach.  12 tablet  3  . aspirin 81 MG tablet Take 81 mg by mouth every other day.       . Calcium Citrate-Vitamin D (GNP CALCIUM CITRATE +D3 PO) Take 1 tablet by mouth daily.      Marland Kitchen levothyroxine (SYNTHROID, LEVOTHROID) 75 MCG tablet TAKE 1 TABLET BY MOUTH DAILY  30 tablet  6   The PMH, PSH, Social History, Family History, Medications, and allergies have been reviewed in [facility], and have been updated if relevant.  OBJECTIVE: BP 150/72  Pulse 92  Temp 99.3 F (37.4 C)  Wt 177 lb (80.287 kg)  SpO2 94%  She appears well, vital signs are as noted. Ears normal.  Throat and pharynx normal.  Neck supple. No adenopathy in the neck. Nose is congested. Sinuses non tender.  Left basilar scattered wheezes, otherwise clear  ASSESSMENT:  bronchitis  PLAN: Given duration and progression of symptoms, will treat for bacterial bronchitis with  Zpack.  Symptomatic therapy suggested: push fluids, rest and return office visit prn if symptoms persist or worsen.  Call or return to clinic prn if these symptoms worsen or fail to improve as anticipated.

## 2012-04-02 ENCOUNTER — Ambulatory Visit (INDEPENDENT_AMBULATORY_CARE_PROVIDER_SITE_OTHER): Payer: Medicare Other | Admitting: Family Medicine

## 2012-04-02 ENCOUNTER — Encounter: Payer: Self-pay | Admitting: Family Medicine

## 2012-04-02 VITALS — BP 140/80 | HR 80 | Temp 97.8°F | Wt 177.0 lb

## 2012-04-02 DIAGNOSIS — H60399 Other infective otitis externa, unspecified ear: Secondary | ICD-10-CM

## 2012-04-02 DIAGNOSIS — H60392 Other infective otitis externa, left ear: Secondary | ICD-10-CM

## 2012-04-02 MED ORDER — NEOMYCIN-POLYMYXIN-HC 3.5-10000-1 OT SUSP: 3.0000 [drp] | Freq: Four times a day (QID) | OTIC | Status: AC

## 2012-04-02 NOTE — Progress Notes (Signed)
(S) 67 y.o. female complains of pain in left ear for 14 days. No fever or URI symptoms. Has not been swimming but ear is very painful to touch.  Patient Active Problem List  Diagnosis  . Unspecified hypothyroidism  . ANXIETY  . GERD  . SCIATICA, ACUTE  . ROTATOR CUFF INJURY, LEFT SHOULDER  . OSTEOPENIA  . HEADACHE  . Routine general medical examination at a health care facility  . COPD (chronic obstructive pulmonary disease)  . Cough  . Nevus  . Plantar fascia syndrome  . Bronchitis  . Myalgia and myositis  . Sedimentation rate elevation  . Strain of chest wall  . Difficulty swallowing  . Leg cramps  . PMR (polymyalgia rheumatica)  . Leg mass, right  . Dependent edema  . Erythema  . Fatigue   Past Medical History  Diagnosis Date  . Anxiety   . COPD (chronic obstructive pulmonary disease)   . GERD (gastroesophageal reflux disease)   . Tobacco abuse   . Osteopenia   . Strain of chest wall 01/20/2011  . PMR (polymyalgia rheumatica)    Past Surgical History  Procedure Laterality Date  . Abdominal hysterectomy    . Lumbar fusion    . Appendectomy    . Cholecystectomy    . Hemorrhoid surgery    . Tubal ligation    . Cervical fusion     History  Substance Use Topics  . Smoking status: Former Smoker -- 1.50 packs/day for 40 years    Types: Cigarettes    Quit date: 02/11/2009  . Smokeless tobacco: Never Used  . Alcohol Use: No   Family History  Problem Relation Age of Onset  . Heart disease Mother   . Cancer Father     lung/chest wall   . Diabetes Brother     siblings  . Hypertension Sister     x 2  . Heart disease Brother   . Breast cancer Paternal Grandmother   . Heart disease Sister   . Diabetes Sister   . Tuberculosis Sister   . Prostate cancer Father   . Cirrhosis Brother    Allergies  Allergen Reactions  . Prednisone     REACTION: hives, difficulty breathing, facial swelling.    Current Outpatient Prescriptions on File Prior to Visit   Medication Sig Dispense Refill  . aspirin 81 MG tablet Take 81 mg by mouth every other day.       Marland Kitchen alendronate (FOSAMAX) 70 MG tablet Take 1 tablet (70 mg total) by mouth every 7 (seven) days. Take with a full glass of water on an empty stomach.  12 tablet  3  . Calcium Citrate-Vitamin D (GNP CALCIUM CITRATE +D3 PO) Take 1 tablet by mouth daily.      Marland Kitchen levothyroxine (SYNTHROID, LEVOTHROID) 75 MCG tablet TAKE 1 TABLET BY MOUTH DAILY  30 tablet  6   No current facility-administered medications on file prior to visit.   The PMH, PSH, Social History, Family History, Medications, and allergies have been reviewed in [facility], and have been updated if relevant.  (O) BP 140/80  Pulse 80  Temp(Src) 97.8 F (36.6 C)  Wt 177 lb (80.287 kg)  BMI 35.73 kg/m2   She appears well, afebrile. Left ear reveals tenderness of the tragus; debris and inflammation in external canal. TM is not well seen due to debris, but visualized aspects appear normal.  (A) Otitis Externa  (P) Instructed to keep ear dry until better; eardrops per orders, call if  persistent pain, swelling or fever, FUV prn. Of note, prednisone listed as allergy but per pt, can take drops and use topical steroids without issue.

## 2012-04-02 NOTE — Patient Instructions (Addendum)
Otitis Externa Otitis externa is a bacterial or fungal infection of the outer ear canal. This is the area from the eardrum to the outside of the ear. Otitis externa is sometimes called "swimmer's ear." CAUSES  Possible causes of infection include:  Swimming in dirty water.  Moisture remaining in the ear after swimming or bathing.  Mild injury (trauma) to the ear.  Objects stuck in the ear (foreign body).  Cuts or scrapes (abrasions) on the outside of the ear. SYMPTOMS  The first symptom of infection is often itching in the ear canal. Later signs and symptoms may include swelling and redness of the ear canal, ear pain, and yellowish-white fluid (pus) coming from the ear. The ear pain may be worse when pulling on the earlobe. DIAGNOSIS  Your caregiver will perform a physical exam. A sample of fluid may be taken from the ear and examined for bacteria or fungi. TREATMENT  Antibiotic ear drops are often given for 10 to 14 days. Treatment may also include pain medicine or corticosteroids to reduce itching and swelling. PREVENTION   Keep your ear dry. Use the corner of a towel to absorb water out of the ear canal after swimming or bathing.  Avoid scratching or putting objects inside your ear. This can damage the ear canal or remove the protective wax that lines the canal. This makes it easier for bacteria and fungi to grow.  Avoid swimming in lakes, polluted water, or poorly chlorinated pools.  You may use ear drops made of rubbing alcohol and vinegar after swimming. Combine equal parts of white vinegar and alcohol in a bottle. Put 3 or 4 drops into each ear after swimming. HOME CARE INSTRUCTIONS   Apply antibiotic ear drops to the ear canal as prescribed by your caregiver.  Only take over-the-counter or prescription medicines for pain, discomfort, or fever as directed by your caregiver.  If you have diabetes, follow any additional treatment instructions from your caregiver.  Keep all  follow-up appointments as directed by your caregiver. SEEK MEDICAL CARE IF:   You have a fever.  Your ear is still red, swollen, painful, or draining pus after 3 days.  Your redness, swelling, or pain gets worse.  You have a severe headache.  You have redness, swelling, pain, or tenderness in the area behind your ear. MAKE SURE YOU:   Understand these instructions.  Will watch your condition.  Will get help right away if you are not doing well or get worse. Document Released: 12/18/2004 Document Revised: 03/12/2011 Document Reviewed: 01/04/2011 [employer] Patient Information 2013 [employer].  

## 2012-04-24 ENCOUNTER — Ambulatory Visit (INDEPENDENT_AMBULATORY_CARE_PROVIDER_SITE_OTHER): Payer: Medicare Other | Admitting: Family Medicine

## 2012-04-24 ENCOUNTER — Encounter: Payer: Self-pay | Admitting: Family Medicine

## 2012-04-24 VITALS — BP 138/72 | HR 84 | Temp 97.7°F | Wt 180.0 lb

## 2012-04-24 DIAGNOSIS — H6980 Other specified disorders of Eustachian tube, unspecified ear: Secondary | ICD-10-CM

## 2012-04-24 DIAGNOSIS — H6990 Unspecified Eustachian tube disorder, unspecified ear: Secondary | ICD-10-CM

## 2012-04-24 DIAGNOSIS — H6982 Other specified disorders of Eustachian tube, left ear: Secondary | ICD-10-CM

## 2012-04-24 NOTE — Patient Instructions (Addendum)
Good to see you. Your ear does look better.  There is some fluid behind your ear drum. Try sudafed as directed the next 2- 3 days. Call me on Monday with an update.

## 2012-04-24 NOTE — Progress Notes (Signed)
67 yo here for persistent left ear pain.    I saw her several weeks ago for left otitis externa.  She feels that it improved somewhat after abx drops but now her ear feels full and gets sharp pain when it "pops.'   Symptoms  Sensation of fullness: yes Ear discharge: no URI symptoms: no  Fever: no    Tinnitus: no Dizziness: no Hearing loss: no Headache: no Toothache: no Jaw click: no Rashes or lesions: no Facial muscle weakness: no  Red Flags Recent trauma: no PMH recurrent OM: no  PMH prior ear surgery: no  Tubes currently: Neither    Patient Active Problem List  Diagnosis  . Unspecified hypothyroidism  . ANXIETY  . GERD  . ROTATOR CUFF INJURY, LEFT SHOULDER  . OSTEOPENIA  . HEADACHE  . COPD (chronic obstructive pulmonary disease)  . Plantar fascia syndrome  . Sedimentation rate elevation  . Dependent edema  . Fatigue   Past Medical History  Diagnosis Date  . Anxiety   . COPD (chronic obstructive pulmonary disease)   . GERD (gastroesophageal reflux disease)   . Tobacco abuse   . Osteopenia   . Strain of chest wall 01/20/2011  . PMR (polymyalgia rheumatica)    Past Surgical History  Procedure Laterality Date  . Abdominal hysterectomy    . Lumbar fusion    . Appendectomy    . Cholecystectomy    . Hemorrhoid surgery    . Tubal ligation    . Cervical fusion     History  Substance Use Topics  . Smoking status: Former Smoker -- 1.50 packs/day for 40 years    Types: Cigarettes    Quit date: 02/11/2009  . Smokeless tobacco: Never Used  . Alcohol Use: No   Family History  Problem Relation Age of Onset  . Heart disease Mother   . Cancer Father     lung/chest wall   . Diabetes Brother     siblings  . Hypertension Sister     x 2  . Heart disease Brother   . Breast cancer Paternal Grandmother   . Heart disease Sister   . Diabetes Sister   . Tuberculosis Sister   . Prostate cancer Father   . Cirrhosis Brother    Allergies  Allergen Reactions  .  Prednisone     REACTION: hives, difficulty breathing, facial swelling.    Current Outpatient Prescriptions on File Prior to Visit  Medication Sig Dispense Refill  . alendronate (FOSAMAX) 70 MG tablet Take 1 tablet (70 mg total) by mouth every 7 (seven) days. Take with a full glass of water on an empty stomach.  12 tablet  3  . aspirin 81 MG tablet Take 81 mg by mouth every other day.       . Calcium Citrate-Vitamin D (GNP CALCIUM CITRATE +D3 PO) Take 1 tablet by mouth daily.      Marland Kitchen levothyroxine (SYNTHROID, LEVOTHROID) 75 MCG tablet TAKE 1 TABLET BY MOUTH DAILY  30 tablet  6   No current facility-administered medications on file prior to visit.   The PMH, PSH, Social History, Family History, Medications, and allergies have been reviewed in [facility], and have been updated if relevant.  Physical exam: BP 138/72  Pulse 84  Temp(Src) 97.7 F (36.5 C)  Wt 180 lb (81.647 kg)  BMI 36.34 kg/m2 Gen:  Alert, pleasant, NAD HEENT: Bilateral TMs and canals clear, she does have some retraction of left ear drum but otherwise looks ok. Psych:  Good eye contact.  Not anxious or depressed appearing.  1.  Left ear pain- Otitis resolved, exam reassuring. Probable ETD at this point. Advised a few days of sudafed. Call or return to clinic prn if these symptoms worsen or fail to improve as anticipated. The patient indicates understanding of these issues and agrees with the plan.

## 2012-05-29 ENCOUNTER — Other Ambulatory Visit: Payer: Self-pay

## 2012-05-29 DIAGNOSIS — Z1231 Encounter for screening mammogram for malignant neoplasm of breast: Secondary | ICD-10-CM

## 2012-06-17 ENCOUNTER — Ambulatory Visit (INDEPENDENT_AMBULATORY_CARE_PROVIDER_SITE_OTHER): Payer: Medicare Other | Admitting: Family Medicine

## 2012-06-17 ENCOUNTER — Ambulatory Visit (INDEPENDENT_AMBULATORY_CARE_PROVIDER_SITE_OTHER)
Admission: RE | Admit: 2012-06-17 | Discharge: 2012-06-17 | Disposition: A | Discharge status: Home / Self Care | Payer: Medicare Other | Source: Ambulatory Visit | Attending: Family Medicine | Admitting: Family Medicine

## 2012-06-17 ENCOUNTER — Encounter: Payer: Self-pay | Admitting: Family Medicine

## 2012-06-17 VITALS — BP 142/82 | HR 80 | Temp 98.0°F | Wt 181.0 lb

## 2012-06-17 DIAGNOSIS — R042 Hemoptysis: Secondary | ICD-10-CM

## 2012-06-17 DIAGNOSIS — Z136 Encounter for screening for cardiovascular disorders: Secondary | ICD-10-CM

## 2012-06-17 DIAGNOSIS — R21 Rash and other nonspecific skin eruption: Secondary | ICD-10-CM

## 2012-06-17 DIAGNOSIS — E559 Vitamin D deficiency, unspecified: Secondary | ICD-10-CM

## 2012-06-17 LAB — CBC WITH DIFFERENTIAL/PLATELET
Basophils Absolute: 0.1 10*3/uL (ref 0.0–0.1)
Basophils Relative: 0.7 % (ref 0.0–3.0)
Eosinophils Absolute: 0.1 10*3/uL (ref 0.0–0.7)
Eosinophils Relative: 1.1 % (ref 0.0–5.0)
HCT: 41.7 % (ref 36.0–46.0)
Hemoglobin: 14 g/dL (ref 12.0–15.0)
Lymphocytes Relative: 27.7 % (ref 12.0–46.0)
Lymphs Abs: 3 10*3/uL (ref 0.7–4.0)
MCHC: 33.6 g/dL (ref 30.0–36.0)
MCV: 90.8 fl (ref 78.0–100.0)
Monocytes Absolute: 1 10*3/uL (ref 0.1–1.0)
Monocytes Relative: 9.2 % (ref 3.0–12.0)
Neutro Abs: 6.7 10*3/uL (ref 1.4–7.7)
Neutrophils Relative %: 61.3 % (ref 43.0–77.0)
Platelets: 303 10*3/uL (ref 150.0–400.0)
RBC: 4.59 Mil/uL (ref 3.87–5.11)
RDW: 14.5 % (ref 11.5–14.6)
WBC: 10.9 10*3/uL — ABNORMAL HIGH (ref 4.5–10.5)

## 2012-06-17 LAB — COMPREHENSIVE METABOLIC PANEL
ALT: 26 U/L (ref 0–35)
AST: 25 U/L (ref 0–37)
Albumin: 3.6 g/dL (ref 3.5–5.2)
Alkaline Phosphatase: 56 U/L (ref 39–117)
BUN: 9 mg/dL (ref 6–23)
CO2: 24 mEq/L (ref 19–32)
Calcium: 9 mg/dL (ref 8.4–10.5)
Chloride: 109 mEq/L (ref 96–112)
Creatinine, Ser: 0.8 mg/dL (ref 0.4–1.2)
GFR: 78.24 mL/min (ref >60.00)
Glucose, Bld: 130 mg/dL — ABNORMAL HIGH (ref 70–99)
Potassium: 3.9 mEq/L (ref 3.5–5.1)
Sodium: 141 mEq/L (ref 135–145)
Total Bilirubin: 0.4 mg/dL (ref 0.3–1.2)
Total Protein: 7.5 g/dL (ref 6.0–8.3)

## 2012-06-17 NOTE — Progress Notes (Signed)
Subjective:    Patient ID: Teresa Jensen, female    DOB: 11-Oct-1945, 67 y.o.   MRN: [id number]  HPI Very pleasant 68 yo former long term smoker here for:  1.  Productive cough x 2 months.  Thick mucous, last week had some hemoptysis. No CP or SOB. No fevers, night sweats, back pain or weight loss. Wt Readings from Last 3 Encounters:  06/17/12 181 lb (82.101 kg)  04/24/12 180 lb (81.647 kg)  04/02/12 177 lb (80.287 kg)   2.  Also has noticed a rash on both of her hands for several weeks.  Has not been using any harsh chemicals/cleaning agents.  Does not itch or burn.  Patient Active Problem List   Diagnosis Date Noted  . Hemoptysis, unspecified 06/17/2012  . Fatigue 08/29/2011  . Dependent edema 05/22/2011  . Sedimentation rate elevation 12/07/2010  . Plantar fascia syndrome 09/13/2010  . COPD (chronic obstructive pulmonary disease) 06/05/2010  . Unspecified hypothyroidism 09/29/2009  . ROTATOR CUFF INJURY, LEFT SHOULDER 09/29/2009  . ANXIETY 10/01/2008  . GERD 10/01/2008  . OSTEOPENIA 10/01/2008  . HEADACHE 10/01/2008   Past Medical History  Diagnosis Date  . Anxiety   . COPD (chronic obstructive pulmonary disease)   . GERD (gastroesophageal reflux disease)   . Tobacco abuse   . Osteopenia   . Strain of chest wall 01/20/2011  . PMR (polymyalgia rheumatica)    Past Surgical History  Procedure Laterality Date  . Abdominal hysterectomy    . Lumbar fusion    . Appendectomy    . Cholecystectomy    . Hemorrhoid surgery    . Tubal ligation    . Cervical fusion     History  Substance Use Topics  . Smoking status: Former Smoker -- 1.50 packs/day for 40 years    Types: Cigarettes    Quit date: 02/11/2009  . Smokeless tobacco: Never Used  . Alcohol Use: No   Family History  Problem Relation Age of Onset  . Heart disease Mother   . Cancer Father     lung/chest wall   . Diabetes Brother     siblings  . Hypertension Sister     x 2  . Heart disease Brother    . Breast cancer Paternal Grandmother   . Heart disease Sister   . Diabetes Sister   . Tuberculosis Sister   . Prostate cancer Father   . Cirrhosis Brother    Allergies  Allergen Reactions  . Prednisone     REACTION: hives, difficulty breathing, facial swelling.    Current Outpatient Prescriptions on File Prior to Visit  Medication Sig Dispense Refill  . alendronate (FOSAMAX) 70 MG tablet Take 1 tablet (70 mg total) by mouth every 7 (seven) days. Take with a full glass of water on an empty stomach.  12 tablet  3  . aspirin 81 MG tablet Take 81 mg by mouth every other day.       . Calcium Citrate-Vitamin D (GNP CALCIUM CITRATE +D3 PO) Take 1 tablet by mouth daily.      Marland Kitchen levothyroxine (SYNTHROID, LEVOTHROID) 75 MCG tablet TAKE 1 TABLET BY MOUTH DAILY  30 tablet  6   No current facility-administered medications on file prior to visit.   The PMH, PSH, Social History, Family History, Medications, and allergies have been reviewed in [facility], and have been updated if relevant.    Review of Systems See HPI    Objective:   Physical Exam BP 142/82  Pulse 80  Temp(Src) 98 F (36.7 C)  Wt 181 lb (82.101 kg)  BMI 36.54 kg/m2 Gen:  Alert, pleasant, NAD HEENT:  MMM Resp:  CTA bilaterally, no wheezes or rhonchi CVS:  RRR Skin:  + faint erythema palms of hands, otherwise unremarkable.  No rashes or lesions noted.    Assessment & Plan:  1. Hemoptysis, unspecified Given hemoptysis, will get CXR to rule out infectious and malignant etiology. If neg, will advise mucinex x 4-5 days. The patient indicates understanding of these issues and agrees with the plan.  - DG Chest 2 View; Future  2. Unspecified vitamin D deficiency  - Vitamin D, 25-hydroxy  3.  Rash- I did not note a rash today, only some faint erythema of palms.  Check labs today.  Reassurance provided. - CBC with Differential - Comprehensive metabolic panel

## 2012-06-17 NOTE — Patient Instructions (Addendum)
Good to see you. We will call you with your lab and xray results.   

## 2012-06-18 ENCOUNTER — Other Ambulatory Visit: Payer: Self-pay | Admitting: *Deleted

## 2012-06-18 LAB — VITAMIN D 25 HYDROXY (VIT D DEFICIENCY, FRACTURES): Vit D, 25-Hydroxy: 22 ng/mL — ABNORMAL LOW (ref 30–89)

## 2012-06-18 MED ORDER — VITAMIN D (ERGOCALCIFEROL) 1.25 MG (50000 UNIT) PO CAPS: ORAL_CAPSULE | ORAL | Status: DC

## 2012-07-02 ENCOUNTER — Ambulatory Visit
Admission: RE | Admit: 2012-07-02 | Discharge: 2012-07-02 | Disposition: A | Discharge status: Home / Self Care | Payer: Medicare Other | Source: Ambulatory Visit

## 2012-07-02 DIAGNOSIS — Z1231 Encounter for screening mammogram for malignant neoplasm of breast: Secondary | ICD-10-CM

## 2012-07-21 ENCOUNTER — Other Ambulatory Visit: Payer: Self-pay | Admitting: Family Medicine

## 2012-08-04 ENCOUNTER — Telehealth: Payer: Self-pay

## 2012-08-04 DIAGNOSIS — H9202 Otalgia, left ear: Secondary | ICD-10-CM

## 2012-08-04 NOTE — Telephone Encounter (Signed)
Pt has seen Dr Dayton Martes x 2 for lt  ear problem; no better pt said feels like wetness coming out of  Lt ear but no drainage seen; lt ear feels stopped up and pops on and off. No pain. Pt request ENT referral.Please advise.

## 2012-08-04 NOTE — Telephone Encounter (Signed)
Advised patient.  She had questions regarding referral so call sent to [facility].

## 2012-08-04 NOTE — Telephone Encounter (Signed)
Referral placed.

## 2012-08-25 ENCOUNTER — Ambulatory Visit: Payer: Medicare Other | Admitting: Family Medicine

## 2012-08-26 ENCOUNTER — Ambulatory Visit (INDEPENDENT_AMBULATORY_CARE_PROVIDER_SITE_OTHER)
Admission: RE | Admit: 2012-08-26 | Discharge: 2012-08-26 | Disposition: A | Discharge status: Home / Self Care | Payer: Medicare Other | Source: Ambulatory Visit | Attending: Family Medicine | Admitting: Family Medicine

## 2012-08-26 ENCOUNTER — Encounter: Payer: Self-pay | Admitting: Family Medicine

## 2012-08-26 ENCOUNTER — Ambulatory Visit (INDEPENDENT_AMBULATORY_CARE_PROVIDER_SITE_OTHER): Payer: Medicare Other | Admitting: Family Medicine

## 2012-08-26 VITALS — BP 130/80 | HR 96 | Temp 98.0°F | Ht 59.0 in | Wt 183.0 lb

## 2012-08-26 DIAGNOSIS — R0781 Pleurodynia: Secondary | ICD-10-CM

## 2012-08-26 DIAGNOSIS — R079 Chest pain, unspecified: Secondary | ICD-10-CM

## 2012-08-26 NOTE — Progress Notes (Signed)
Subjective:    Patient ID: Teresa Jensen, female    DOB: [birth year], 67 y.o.   MRN: [id number]  HPI  Very pleasant 67 yo former smoker her for two weeks of left sided rib pain.  No known trauma but did have a "big sneeze" prior to pain starting.  Pain worsened by deep inspirations and palpation.  No SOB.  Patient Active Problem List   Diagnosis Date Noted  . Rib pain on left side 08/26/2012  . Hemoptysis, unspecified 06/17/2012  . Fatigue 08/29/2011  . Dependent edema 05/22/2011  . Sedimentation rate elevation 12/07/2010  . Plantar fascia syndrome 09/13/2010  . COPD (chronic obstructive pulmonary disease) 06/05/2010  . Unspecified hypothyroidism 09/29/2009  . ROTATOR CUFF INJURY, LEFT SHOULDER 09/29/2009  . ANXIETY 10/01/2008  . GERD 10/01/2008  . OSTEOPENIA 10/01/2008  . HEADACHE 10/01/2008   Past Medical History  Diagnosis Date  . Anxiety   . COPD (chronic obstructive pulmonary disease)   . GERD (gastroesophageal reflux disease)   . Tobacco abuse   . Osteopenia   . Strain of chest wall 01/20/2011  . PMR (polymyalgia rheumatica)    Past Surgical History  Procedure Laterality Date  . Abdominal hysterectomy    . Lumbar fusion    . Appendectomy    . Cholecystectomy    . Hemorrhoid surgery    . Tubal ligation    . Cervical fusion     History  Substance Use Topics  . Smoking status: Former Smoker -- 1.50 packs/day for 40 years    Types: Cigarettes    Quit date: 02/11/2009  . Smokeless tobacco: Never Used  . Alcohol Use: No   Family History  Problem Relation Age of Onset  . Heart disease Mother   . Cancer Father     lung/chest wall   . Diabetes Brother     siblings  . Hypertension Sister     x 2  . Heart disease Brother   . Breast cancer Paternal Grandmother   . Heart disease Sister   . Diabetes Sister   . Tuberculosis Sister   . Prostate cancer Father   . Cirrhosis Brother    Allergies  Allergen Reactions  . Prednisone     REACTION: hives,  difficulty breathing, facial swelling.    Current Outpatient Prescriptions on File Prior to Visit  Medication Sig Dispense Refill  . alendronate (FOSAMAX) 70 MG tablet Take 1 tablet (70 mg total) by mouth every 7 (seven) days. Take with a full glass of water on an empty stomach.  12 tablet  3  . aspirin 81 MG tablet Take 81 mg by mouth every other day.       . Calcium Citrate-Vitamin D (GNP CALCIUM CITRATE +D3 PO) Take 1 tablet by mouth daily.      Marland Kitchen levothyroxine (SYNTHROID, LEVOTHROID) 75 MCG tablet TAKE 1 TABLET BY MOUTH DAILY  30 tablet  5   No current facility-administered medications on file prior to visit.   The PMH, PSH, Social History, Family History, Medications, and allergies have been reviewed in [facility], and have been updated if relevant.   Review of Systems See HPI    Objective:   Physical Exam BP 130/80  Pulse 96  Temp(Src) 98 F (36.7 C)  Ht 4\' 11"  (1.499 m)  Wt 183 lb (83.008 kg)  BMI 36.94 kg/m2 Gen:  Alert, pleasant, NAD Resp:  CTA bilaterally  Chest wall: Left inferior rib cage TTP Ext:  No edema Psych:  Good eye  contact.  Not anxious or depressed appearing.      Assessment & Plan:  1. Rib pain on left side New- ?rib fx.  Discussed supportive care. Will get CXR only to rule out other processes as I explained to pt rib fx often do not appear on CXR. Call or return to clinic prn if these symptoms worsen or fail to improve as anticipated. The patient indicates understanding of these issues and agrees with the plan.  - DG Chest 2 View; Future

## 2012-08-26 NOTE — Patient Instructions (Addendum)
Good to see you. We will call you with your xray results.   

## 2012-09-09 ENCOUNTER — Other Ambulatory Visit (INDEPENDENT_AMBULATORY_CARE_PROVIDER_SITE_OTHER): Payer: Medicare Other

## 2012-09-09 DIAGNOSIS — M899 Disorder of bone, unspecified: Secondary | ICD-10-CM

## 2012-09-09 NOTE — Addendum Note (Signed)
Addended by: Baldomero Lamy on: 09/09/2012 08:15 AM   Modules accepted: Orders

## 2012-09-10 LAB — VITAMIN D 25 HYDROXY (VIT D DEFICIENCY, FRACTURES): Vit D, 25-Hydroxy: 30 ng/mL (ref 30–89)

## 2012-09-11 ENCOUNTER — Encounter: Payer: Self-pay | Admitting: Family Medicine

## 2012-09-11 ENCOUNTER — Ambulatory Visit (INDEPENDENT_AMBULATORY_CARE_PROVIDER_SITE_OTHER): Payer: Medicare Other | Admitting: Family Medicine

## 2012-09-11 VITALS — BP 154/78 | HR 85 | Temp 98.2°F | Ht 59.0 in | Wt 183.0 lb

## 2012-09-11 DIAGNOSIS — R51 Headache: Secondary | ICD-10-CM

## 2012-09-11 MED ORDER — KETOROLAC TROMETHAMINE 30 MG/ML IJ SOLN
30.0000 mg | Freq: Once | INTRAMUSCULAR | Status: AC
  Administered 2012-09-11: 30 mg via INTRAMUSCULAR

## 2012-09-11 NOTE — Addendum Note (Signed)
Addended by: Eliezer Bottom on: 09/11/2012 03:43 PM   Modules accepted: Orders

## 2012-09-11 NOTE — Patient Instructions (Addendum)
Good to see you. This is likely a tension headache.  Consider getting a massage.  Let me know if this headache continues.

## 2012-09-11 NOTE — Progress Notes (Signed)
Subjective:    Patient ID: Teresa Jensen, female    DOB: Jul 22, 1945, 67 y.o.   MRN: [id number]  HPI  67 yo here for HA x 3 days.  HA is mainly back of head, radiates to "top of her head."  It is dull, not relieve by Tylenol. She has been under more stress lately.  No photophobia, pain in her temple, nausea, vomiting or sensitivity to light.  No facial droop or other focal neurological symptoms.  Has had a headache like this before "but it's been a long time."  Patient Active Problem List   Diagnosis Date Noted  . Rib pain on left side 08/26/2012  . Fatigue 08/29/2011  . Dependent edema 05/22/2011  . Sedimentation rate elevation 12/07/2010  . Plantar fascia syndrome 09/13/2010  . COPD (chronic obstructive pulmonary disease) 06/05/2010  . Unspecified hypothyroidism 09/29/2009  . ROTATOR CUFF INJURY, LEFT SHOULDER 09/29/2009  . ANXIETY 10/01/2008  . GERD 10/01/2008  . OSTEOPENIA 10/01/2008  . HEADACHE 10/01/2008   Past Medical History  Diagnosis Date  . Anxiety   . COPD (chronic obstructive pulmonary disease)   . GERD (gastroesophageal reflux disease)   . Tobacco abuse   . Osteopenia   . Strain of chest wall 01/20/2011  . PMR (polymyalgia rheumatica)    Past Surgical History  Procedure Laterality Date  . Abdominal hysterectomy    . Lumbar fusion    . Appendectomy    . Cholecystectomy    . Hemorrhoid surgery    . Tubal ligation    . Cervical fusion     History  Substance Use Topics  . Smoking status: Former Smoker -- 1.50 packs/day for 40 years    Types: Cigarettes    Quit date: 02/11/2009  . Smokeless tobacco: Never Used  . Alcohol Use: No   Family History  Problem Relation Age of Onset  . Heart disease Mother   . Cancer Father     lung/chest wall   . Diabetes Brother     siblings  . Hypertension Sister     x 2  . Heart disease Brother   . Breast cancer Paternal Grandmother   . Heart disease Sister   . Diabetes Sister   . Tuberculosis Sister    . Prostate cancer Father   . Cirrhosis Brother    Allergies  Allergen Reactions  . Prednisone     REACTION: hives, difficulty breathing, facial swelling.    Current Outpatient Prescriptions on File Prior to Visit  Medication Sig Dispense Refill  . alendronate (FOSAMAX) 70 MG tablet Take 1 tablet (70 mg total) by mouth every 7 (seven) days. Take with a full glass of water on an empty stomach.  12 tablet  3  . aspirin 81 MG tablet Take 81 mg by mouth every other day.       . Calcium Citrate-Vitamin D (GNP CALCIUM CITRATE +D3 PO) Take 1 tablet by mouth daily.      Marland Kitchen levothyroxine (SYNTHROID, LEVOTHROID) 75 MCG tablet TAKE 1 TABLET BY MOUTH DAILY  30 tablet  5  . VITAMIN D, ERGOCALCIFEROL, PO Take 1600 units by mouth daily       No current facility-administered medications on file prior to visit.   The PMH, PSH, Social History, Family History, Medications, and allergies have been reviewed in [facility], and have been updated if relevant.   Review of Systems See HPI    Objective:   Physical Exam  Constitutional: She is oriented to person, place, and  time. She appears well-developed and well-nourished. No distress.  HENT:  Head: Normocephalic and atraumatic.  Eyes: Pupils are equal, round, and reactive to light.  No photophobia  Neck: Normal range of motion. Neck supple.  Neurological: She is alert and oriented to person, place, and time. She has normal reflexes. She displays normal reflexes. No cranial nerve deficit. She exhibits normal muscle tone. Coordination normal.  Psychiatric: She has a normal mood and affect.   BP 154/78  Pulse 85  Temp(Src) 98.2 F (36.8 C)  Ht 4\' 11"  (1.499 m)  Wt 183 lb (83.008 kg)  BMI 36.94 kg/m2     Assessment & Plan:  1. HEADACHE Consistent with tension HA. No red flag symptoms, exam unremarkable. Given IM toradol 30 mg x 1. Call or return to clinic prn if these symptoms worsen or fail to improve as anticipated. The patient indicates  understanding of these issues and agrees with the plan.

## 2012-09-17 ENCOUNTER — Ambulatory Visit: Payer: Medicare Other | Admitting: Family Medicine

## 2012-09-18 ENCOUNTER — Encounter: Payer: Self-pay | Admitting: Family Medicine

## 2012-09-18 ENCOUNTER — Ambulatory Visit (INDEPENDENT_AMBULATORY_CARE_PROVIDER_SITE_OTHER): Payer: Medicare Other | Admitting: Family Medicine

## 2012-09-18 VITALS — BP 110/64 | HR 94 | Temp 98.0°F | Wt 184.0 lb

## 2012-09-18 DIAGNOSIS — J209 Acute bronchitis, unspecified: Secondary | ICD-10-CM

## 2012-09-18 MED ORDER — AZITHROMYCIN 250 MG PO TABS: ORAL_TABLET | ORAL | Status: DC

## 2012-09-18 MED ORDER — HYDROCODONE-HOMATROPINE 5-1.5 MG/5ML PO SYRP: 5.0000 mL | ORAL_SOLUTION | Freq: Three times a day (TID) | ORAL | Status: DC | PRN

## 2012-09-18 NOTE — Patient Instructions (Signed)
You have bronchitis. Take Zpack as directed.  Drink lots of fluids.  Treat sympotmatically with Mucinex, nasal saline irrigation, and Tylenol/Ibuprofen. A Cough suppressant at night. Call if not improving as expected in 5-7 days.  It was good to see you.

## 2012-09-18 NOTE — Progress Notes (Signed)
SUBJECTIVE:  Teresa Jensen is a 67 y.o. female who complains of coryza, congestion, sneezing, productive cough and fever for 5 days. She denies a history of anorexia and chest pain and admits to a history of asthma. Patient denies smoke cigarettes.   Patient Active Problem List   Diagnosis Date Noted  . Rib pain on left side 08/26/2012  . Fatigue 08/29/2011  . Dependent edema 05/22/2011  . Sedimentation rate elevation 12/07/2010  . Plantar fascia syndrome 09/13/2010  . COPD (chronic obstructive pulmonary disease) 06/05/2010  . Unspecified hypothyroidism 09/29/2009  . ROTATOR CUFF INJURY, LEFT SHOULDER 09/29/2009  . ANXIETY 10/01/2008  . GERD 10/01/2008  . OSTEOPENIA 10/01/2008  . HEADACHE 10/01/2008   Past Medical History  Diagnosis Date  . Anxiety   . COPD (chronic obstructive pulmonary disease)   . GERD (gastroesophageal reflux disease)   . Tobacco abuse   . Osteopenia   . Strain of chest wall 01/20/2011  . PMR (polymyalgia rheumatica)    Past Surgical History  Procedure Laterality Date  . Abdominal hysterectomy    . Lumbar fusion    . Appendectomy    . Cholecystectomy    . Hemorrhoid surgery    . Tubal ligation    . Cervical fusion     History  Substance Use Topics  . Smoking status: Former Smoker -- 1.50 packs/day for 40 years    Types: Cigarettes    Quit date: 02/11/2009  . Smokeless tobacco: Never Used  . Alcohol Use: No   Family History  Problem Relation Age of Onset  . Heart disease Mother   . Cancer Father     lung/chest wall   . Diabetes Brother     siblings  . Hypertension Sister     x 2  . Heart disease Brother   . Breast cancer Paternal Grandmother   . Heart disease Sister   . Diabetes Sister   . Tuberculosis Sister   . Prostate cancer Father   . Cirrhosis Brother    Allergies  Allergen Reactions  . Prednisone     REACTION: hives, difficulty breathing, facial swelling.    Current Outpatient Prescriptions on File Prior to Visit   Medication Sig Dispense Refill  . alendronate (FOSAMAX) 70 MG tablet Take 1 tablet (70 mg total) by mouth every 7 (seven) days. Take with a full glass of water on an empty stomach.  12 tablet  3  . aspirin 81 MG tablet Take 81 mg by mouth every other day.       . Calcium Citrate-Vitamin D (GNP CALCIUM CITRATE +D3 PO) Take 1 tablet by mouth daily.      Marland Kitchen levothyroxine (SYNTHROID, LEVOTHROID) 75 MCG tablet TAKE 1 TABLET BY MOUTH DAILY  30 tablet  5  . VITAMIN D, ERGOCALCIFEROL, PO Take 1600 units by mouth daily       No current facility-administered medications on file prior to visit.   The PMH, PSH, Social History, Family History, Medications, and allergies have been reviewed in [facility], and have been updated if relevant.  OBJECTIVE: BP 110/64  Pulse 94  Temp(Src) 98 F (36.7 C)  Wt 184 lb (83.462 kg)  BMI 37.14 kg/m2  SpO2 93%  She appears well, vital signs are as noted. Ears normal.  Throat and pharynx normal.  Neck supple. No adenopathy in the neck. Nose is congested. Sinuses non tender.  Right basilar scattered wheezes, otherwise clear  ASSESSMENT:  bronchitis  PLAN: Given duration and progression of symptoms, will  treat for bacterial bronchitis with Zpack.  Symptomatic therapy suggested: push fluids, rest and return office visit prn if symptoms persist or worsen.  Call or return to clinic prn if these symptoms worsen or fail to improve as anticipated.

## 2012-10-09 ENCOUNTER — Telehealth: Payer: Self-pay | Admitting: *Deleted

## 2012-10-09 DIAGNOSIS — R51 Headache: Secondary | ICD-10-CM

## 2012-10-09 NOTE — Telephone Encounter (Signed)
Pt has been taking Tylenol for HA.  She says HAs are getting worse, it was especially bad last night and has eased off a bit today but is still hurting.

## 2012-10-09 NOTE — Telephone Encounter (Signed)
Has the headache changed?  What is she taking for it?

## 2012-10-09 NOTE — Telephone Encounter (Signed)
Patient advised.

## 2012-10-09 NOTE — Telephone Encounter (Signed)
Our best bet would be to send her to a neurologist for evaluation and treatment of her headaches.  If they are getting worse, we need to look into what is going on.  They can certainly order imaging of her brain if they feel this is necessary.  Referral placed.

## 2012-10-09 NOTE — Telephone Encounter (Signed)
Patient states her head is still hurting.   She says you had mentioned a CAT scan and she didn't know if you would schedule that or if you need to see her again?  Please advise ASAP.

## 2012-10-29 ENCOUNTER — Ambulatory Visit (INDEPENDENT_AMBULATORY_CARE_PROVIDER_SITE_OTHER): Payer: Medicare Other | Admitting: Neurology

## 2012-10-29 ENCOUNTER — Encounter: Payer: Self-pay | Admitting: Neurology

## 2012-10-29 VITALS — BP 140/70 | HR 84 | Temp 98.5°F | Ht 59.0 in | Wt 183.0 lb

## 2012-10-29 DIAGNOSIS — M542 Cervicalgia: Secondary | ICD-10-CM

## 2012-10-29 DIAGNOSIS — G4486 Cervicogenic headache: Secondary | ICD-10-CM

## 2012-10-29 DIAGNOSIS — R51 Headache: Secondary | ICD-10-CM

## 2012-10-29 NOTE — Progress Notes (Signed)
NEUROLOGY CONSULTATION NOTE  Teresa Jensen MRN: [id number] DOB: 10-11-1945  Referring provider: Dr. Dayton Martes Primary care provider: Dr. Dayton Martes  Reason for consult:  Headache.  HISTORY OF PRESENT ILLNESS: Teresa Jensen is a 67 year old right-handed woman with prior neck surgery, COPD, PMR and anxiety who presents with headache.  Records and images were personally reviewed where available.    Onset: Early September 2014 Location:  back of head either side (left worse than right), right-sided posterior neck pain, above left eye.  Nop temporal. Quality:  Dull Intensity:  7/10 Aura:  no Associated symptoms:  Brief flashing light (just a second or two, and not always associated).  Sensitive to smell of smoke.  No nausea, vomiting, photophobia, phonophobia, vision loss, facial numbness, vertigo, focal weakness.  Duration:  Up to 2 days Frequency:  Once a month.  (had a total of two attacks, last one two weeks ago) Activity:  Able to perform ADLs Triggers/exacerbating factors:  None Relieving factors:  Aleve  Past abortive therapy:  Tylenol (ineffective) Past preventative therapy:  none  Current abortive therapy:  Aleve Current preventative therapy:  none  Caffeine:  1 cup coffee daily Sleep hygiene:  Varies, sometimes good, sometimes bad Stress/depression:  no Personal history of headache:  no Family history of headache:  no  10/05/08 CT Head (for right-sided headache): unremarkable.  PAST MEDICAL HISTORY: Past Medical History  Diagnosis Date  . Anxiety   . COPD (chronic obstructive pulmonary disease)   . GERD (gastroesophageal reflux disease)   . Tobacco abuse   . Osteopenia   . Strain of chest wall 01/20/2011  . PMR (polymyalgia rheumatica)   . New onset of headaches     PAST SURGICAL HISTORY: Past Surgical History  Procedure Laterality Date  . Abdominal hysterectomy    . Lumbar fusion    . Appendectomy    . Cholecystectomy    . Hemorrhoid surgery    . Tubal  ligation    . Cervical fusion      MEDICATIONS: Current Outpatient Prescriptions on File Prior to Visit  Medication Sig Dispense Refill  . alendronate (FOSAMAX) 70 MG tablet Take 1 tablet (70 mg total) by mouth every 7 (seven) days. Take with a full glass of water on an empty stomach.  12 tablet  3  . aspirin 81 MG tablet Take 81 mg by mouth every other day.       . Calcium Citrate-Vitamin D (GNP CALCIUM CITRATE +D3 PO) Take 1 tablet by mouth daily.      Marland Kitchen levothyroxine (SYNTHROID, LEVOTHROID) 75 MCG tablet TAKE 1 TABLET BY MOUTH DAILY  30 tablet  5  . VITAMIN D, ERGOCALCIFEROL, PO Take 1600 units by mouth daily       No current facility-administered medications on file prior to visit.    ALLERGIES: Allergies  Allergen Reactions  . Prednisone     REACTION: hives, difficulty breathing, facial swelling.     FAMILY HISTORY: Family History  Problem Relation Age of Onset  . Heart disease Mother   . Cancer Father     lung/chest wall   . Diabetes Brother     siblings  . Hypertension Sister     x 2  . Heart disease Brother   . Breast cancer Paternal Grandmother   . Heart disease Sister   . Diabetes Sister   . Tuberculosis Sister   . Prostate cancer Father   . Cirrhosis Brother     SOCIAL HISTORY: History  Social History  . Marital Status: Married    Spouse Name: N/A    Number of Children: 1  . Years of Education: N/A   Occupational History  . [employer] as a Tourist information centre manager     retired   Social History Main Topics  . Smoking status: Former Smoker -- 1.50 packs/day for 40 years    Types: Cigarettes    Quit date: 02/11/2009  . Smokeless tobacco: Never Used  . Alcohol Use: No  . Drug Use: No  . Sexual Activity: Not on file   Other Topics Concern  . Not on file   Social History Narrative   Lives in [place] with her husband.  Takes care of her two grandchildren, ages 15 and 2, everyday in [place].  Has one son who is 32.    REVIEW OF  SYSTEMS: Constitutional: No fevers, chills, or sweats, no generalized fatigue, change in appetite Eyes: No visual changes, double vision, eye pain Ear, nose and throat: No hearing loss, ear pain, nasal congestion, sore throat Cardiovascular: No chest pain, palpitations Respiratory:  No shortness of breath at rest or with exertion, wheezes GastrointestinaI: No nausea, vomiting, diarrhea, abdominal pain, fecal incontinence Genitourinary:  No dysuria, urinary retention or frequency Musculoskeletal:  No neck pain, back pain Integumentary: No rash, pruritus, skin lesions Neurological: as above Psychiatric: No depression, insomnia, anxiety Endocrine: No palpitations, fatigue, diaphoresis, mood swings, change in appetite, change in weight, increased thirst Hematologic/Lymphatic:  No anemia, purpura, petechiae. Allergic/Immunologic: no itchy/runny eyes, nasal congestion, recent allergic reactions, rashes  PHYSICAL EXAM: Filed Vitals:   10/29/12 0822  BP: 140/70  Pulse: 84  Temp: 98.5 F (36.9 C)   General: No acute distress Head:  Normocephalic/atraumatic Neck:  Supple, reduced range of motion with head turn to left, right sided paraspinal tenderness.  No tenderness at occipital notch. Back:  No paraspinal tenderness Heart: regular rate and rhythm Lungs: Clear to auscultation bilaterally. Vascular: No carotid bruits. Neurological Exam: Mental status: alert and oriented to person, place, and time, speech fluent and not dysarthric, language intact. Cranial nerves: CN I: not tested CN II: pupils equal, round and reactive to light, visual fields intact, fundi unremarkable. CN III, IV, VI:  full range of motion, no nystagmus, no ptosis CN V: facial sensation intact CN VII: upper and lower face symmetric CN VIII: hearing intact CN IX, X: gag intact, uvula midline CN XI: sternocleidomastoid and trapezius muscles intact CN XII: tongue midline Bulk & Tone: normal, no fasciculations. Motor:   Sensation: temperature and vibration intact Deep Tendon Reflexes: 2+ throughout, toes down Finger to nose testing: normal Heel to shin: normal Gait: normal.  Able to walk in tandem. Romberg negative.  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  She does have history of neck surgery and is exhibiting neck pain.  PLAN: We will try PT first.  If ineffective, will have to turn to other management and possible imaging.  Follow up in 2 months.  45 minutes spent with patient, over 50% spent counseling and coordinating care.  Thank you for allowing me to take part in the care of this patient.  Shon Millet, DO  CC:  [name], MD

## 2012-10-29 NOTE — Patient Instructions (Addendum)
Headache may be coming from the neck.  It does not seem like migraine and you have a normal neurological exam, so I don't suspect anything going on in the brain at this point.  I recommend physical therapy of the neck.  Follow up in 2 months.  If ineffective, then we will have to look at other treatment options and testing.  May continue to take Aleve as needed (just don't take more than 2-3 days out of week).  Call with questions or concerns.   We will refer you to Physical Therapy for your headaches and neck pain. They will call you to schedule the appointment.

## 2012-11-03 ENCOUNTER — Ambulatory Visit: Payer: Medicare Other | Attending: Neurology | Admitting: Physical Therapy

## 2012-11-03 DIAGNOSIS — IMO0001 Reserved for inherently not codable concepts without codable children: Secondary | ICD-10-CM | POA: Insufficient documentation

## 2012-11-03 DIAGNOSIS — R293 Abnormal posture: Secondary | ICD-10-CM | POA: Insufficient documentation

## 2012-11-03 DIAGNOSIS — M542 Cervicalgia: Secondary | ICD-10-CM | POA: Insufficient documentation

## 2012-11-03 DIAGNOSIS — R51 Headache: Secondary | ICD-10-CM | POA: Insufficient documentation

## 2012-11-10 ENCOUNTER — Ambulatory Visit: Payer: Medicare Other | Admitting: Rehabilitation

## 2012-11-12 ENCOUNTER — Ambulatory Visit: Payer: Medicare Other | Admitting: Rehabilitation

## 2012-11-17 ENCOUNTER — Encounter: Payer: Medicare Other | Admitting: Rehabilitation

## 2012-11-19 ENCOUNTER — Encounter: Payer: Medicare Other | Admitting: Rehabilitation

## 2012-11-20 ENCOUNTER — Other Ambulatory Visit: Payer: Self-pay | Admitting: Family Medicine

## 2012-11-21 ENCOUNTER — Encounter: Payer: Self-pay | Admitting: Family Medicine

## 2012-11-21 ENCOUNTER — Ambulatory Visit (INDEPENDENT_AMBULATORY_CARE_PROVIDER_SITE_OTHER): Payer: Medicare Other | Admitting: Family Medicine

## 2012-11-21 VITALS — BP 160/84 | HR 88 | Temp 98.4°F | Wt 183.8 lb

## 2012-11-21 DIAGNOSIS — R071 Chest pain on breathing: Secondary | ICD-10-CM

## 2012-11-21 DIAGNOSIS — R0789 Other chest pain: Secondary | ICD-10-CM | POA: Insufficient documentation

## 2012-11-21 MED ORDER — GABAPENTIN 300 MG PO CAPS: 300.0000 mg | ORAL_CAPSULE | Freq: Two times a day (BID) | ORAL | Status: DC

## 2012-11-21 MED ORDER — VALACYCLOVIR HCL 1 G PO TABS: 1000.0000 mg | ORAL_TABLET | Freq: Three times a day (TID) | ORAL | Status: DC

## 2012-11-21 NOTE — Progress Notes (Addendum)
  Subjective:    Patient ID: Teresa Jensen, female    DOB: 1945/06/18, 67 y.o.   MRN: [id number]  HPI CC: R breast discomfort  Pleasant 67 yo patient of Dr. Elmer Sow presents today with 1 wk h/o right breast burning that comes on intermittently, worse at night and with certain positions (ie crossing arm across chest).  Sometimes wakes her up from pain.  Tender spot on lateral breast but no swelling, skin changes, no lumps, nipple discharge.  Tylenol doesn't really help.    Denies rashes.  Denies inciting trauma/injury.  No fevers/chills, dyspnea, cough, other chest pain.  This past week has had mild cough and low grade temperature (99.4)  Last mammogram 07/2012 - WNL , Birads 1. bp elevated today - states at home normally runs better. Declines flu shot today. Has not had shingles shot.  Past Medical History  Diagnosis Date  . Anxiety   . COPD (chronic obstructive pulmonary disease)   . GERD (gastroesophageal reflux disease)   . Tobacco abuse   . Osteopenia   . Strain of chest wall 01/20/2011  . PMR (polymyalgia rheumatica)   . New onset of headaches      Review of Systems Per HPI    Objective:   Physical Exam  Nursing note and vitals reviewed. Constitutional: She appears well-developed and well-nourished. No distress.  HENT:  Mouth/Throat: Oropharynx is clear and moist. No oropharyngeal exudate.  Eyes: Conjunctivae and EOM are normal. Pupils are equal, round, and reactive to light. No scleral icterus.  Cardiovascular: Normal rate, regular rhythm and intact distal pulses.   Murmur (2/6 SEM best at LUSB) heard. Pulmonary/Chest: Effort normal and breath sounds normal. No respiratory distress. She has no wheezes. She has no rales. She exhibits tenderness.  Crackles bibasilarly  Tender to palpation superior chest wall as well as upper right back at scapula  Abdominal: Soft. Normal appearance and bowel sounds are normal. She exhibits no distension and no mass. There is no  tenderness. There is no rigidity, no rebound, no guarding, no CVA tenderness and negative Murphy's sign.  Musculoskeletal: She exhibits no edema.  Skin: Skin is warm and dry. Rash noted. No erythema.  Isolated dried lesion right lateral breast, tender to touch       Assessment & Plan:

## 2012-11-21 NOTE — Assessment & Plan Note (Addendum)
Describes right sided neuralgia type pain of breast, as well as exam consistent with pain superior right chest wall and upper right back pain. With isolated skin lesion ?mild case of herpes zoster - will treat presumptively as such with valtrex 7d course and gabapentin for nerve pain. Update [place] if sxs persist or worsen.

## 2012-11-21 NOTE — Progress Notes (Signed)
Pre-visit discussion using our clinic review tool. No additional management support is needed unless otherwise documented below in the visit note.  

## 2012-11-21 NOTE — Patient Instructions (Addendum)
I think you may have had a case of shingles - treat with antiviral and gabapentin for nerve pain.  Start gabapentin nightly for 2-3 days to ensure tolerating well, then may increase to twice daily Good to see you today, call us with questions. If persistent discomfort, let us know.  Shingles Shingles (herpes zoster) is an infection that is caused by the same virus that causes chickenpox (varicella). The infection causes a painful skin rash and fluid-filled blisters, which eventually break open, crust over, and heal. It may occur in any area of the body, but it usually affects only one side of the body or face. The pain of shingles usually lasts about 1 month. However, some people with shingles may develop long-term (chronic) pain in the affected area of the body. Shingles often occurs many years after the person had chickenpox. It is more common:  In people older than 50 years.  In people with weakened immune systems, such as those with HIV, AIDS, or cancer.  In people taking medicines that weaken the immune system, such as transplant medicines.  In people under great stress. CAUSES  Shingles is caused by the varicella zoster virus (VZV), which also causes chickenpox. After a person is infected with the virus, it can remain in the person's body for years in an inactive state (dormant). To cause shingles, the virus reactivates and breaks out as an infection in a nerve root. The virus can be spread from person to person (contagious) through contact with open blisters of the shingles rash. It will only spread to people who have not had chickenpox. When these people are exposed to the virus, they may develop chickenpox. They will not develop shingles. Once the blisters scab over, the person is no longer contagious and cannot spread the virus to others. SYMPTOMS  Shingles shows up in stages. The initial symptoms may be pain, itching, and tingling in an area of the skin. This pain is usually described  as burning, stabbing, or throbbing.In a few days or weeks, a painful red rash will appear in the area where the pain, itching, and tingling were felt. The rash is usually on one side of the body in a band or belt-like pattern. Then, the rash usually turns into fluid-filled blisters. They will scab over and dry up in approximately 2 3 weeks. Flu-like symptoms may also occur with the initial symptoms, the rash, or the blisters. These may include:  Fever.  Chills.  Headache.  Upset stomach. DIAGNOSIS  Your caregiver will perform a skin exam to diagnose shingles. Skin scrapings or fluid samples may also be taken from the blisters. This sample will be examined under a microscope or sent to a lab for further testing. TREATMENT  There is no specific cure for shingles. Your caregiver will likely prescribe medicines to help you manage the pain, recover faster, and avoid long-term problems. This may include antiviral drugs, anti-inflammatory drugs, and pain medicines. HOME CARE INSTRUCTIONS   Take a cool bath or apply cool compresses to the area of the rash or blisters as directed. This may help with the pain and itching.   Only take over-the-counter or prescription medicines as directed by your caregiver.   Rest as directed by your caregiver.  Keep your rash and blisters clean with mild soap and cool water or as directed by your caregiver.  Do not pick your blisters or scratch your rash. Apply an anti-itch cream or numbing creams to the affected area as directed by your caregiver.  Keep your shingles rash covered with a loose bandage (dressing).  Avoid skin contact with:  Babies.   Pregnant women.   Children with eczema.   Elderly people with transplants.   People with chronic illnesses, such as leukemia or AIDS.   Wear loose-fitting clothing to help ease the pain of material rubbing against the rash.  Keep all follow-up appointments with your caregiver.If the area involved  is on your face, you may receive a referral for follow-up to a specialist, such as an eye doctor (ophthalmologist) or an ear, nose, and throat (ENT) doctor. Keeping all follow-up appointments will help you avoid eye complications, chronic pain, or disability.  SEEK IMMEDIATE MEDICAL CARE IF:   You have facial pain, pain around the eye area, or loss of feeling on one side of your face.  You have ear pain or ringing in your ear.  You have loss of taste.  Your pain is not relieved with prescribed medicines.   Your redness or swelling spreads.   You have more pain and swelling.  Your condition is worsening or has changed.   You have a feveror persistent symptoms for more than 2 3 days.  You have a fever and your symptoms suddenly get worse. MAKE SURE YOU:  Understand these instructions.  Will watch your condition.  Will get help right away if you are not doing well or get worse. Document Released: 12/18/2004 Document Revised: 09/12/2011 Document Reviewed: 08/02/2011 [facility] Patient Information 2014 [place], [place].

## 2012-12-30 ENCOUNTER — Ambulatory Visit: Payer: Medicare Other | Admitting: Neurology

## 2012-12-30 ENCOUNTER — Ambulatory Visit (INDEPENDENT_AMBULATORY_CARE_PROVIDER_SITE_OTHER): Payer: Medicare Other | Admitting: Family Medicine

## 2012-12-30 ENCOUNTER — Encounter: Payer: Self-pay | Admitting: Family Medicine

## 2012-12-30 VITALS — BP 126/70 | HR 80 | Temp 97.6°F | Ht 59.0 in | Wt 185.5 lb

## 2012-12-30 DIAGNOSIS — E162 Hypoglycemia, unspecified: Secondary | ICD-10-CM

## 2012-12-30 DIAGNOSIS — E039 Hypothyroidism, unspecified: Secondary | ICD-10-CM

## 2012-12-30 DIAGNOSIS — M899 Disorder of bone, unspecified: Secondary | ICD-10-CM

## 2012-12-30 DIAGNOSIS — R5383 Other fatigue: Secondary | ICD-10-CM

## 2012-12-30 DIAGNOSIS — R5381 Other malaise: Secondary | ICD-10-CM

## 2012-12-30 DIAGNOSIS — R42 Dizziness and giddiness: Secondary | ICD-10-CM

## 2012-12-30 DIAGNOSIS — R109 Unspecified abdominal pain: Secondary | ICD-10-CM

## 2012-12-30 DIAGNOSIS — R011 Cardiac murmur, unspecified: Secondary | ICD-10-CM

## 2012-12-30 LAB — COMPREHENSIVE METABOLIC PANEL
ALT: 23 U/L (ref 0–35)
AST: 24 U/L (ref 0–37)
Albumin: 3.9 g/dL (ref 3.5–5.2)
Alkaline Phosphatase: 58 U/L (ref 39–117)
BUN: 13 mg/dL (ref 6–23)
CO2: 27 mEq/L (ref 19–32)
Calcium: 9.1 mg/dL (ref 8.4–10.5)
Chloride: 104 mEq/L (ref 96–112)
Creatinine, Ser: 0.9 mg/dL (ref 0.4–1.2)
GFR: 67.96 mL/min (ref >60.00)
Glucose, Bld: 131 mg/dL — ABNORMAL HIGH (ref 70–99)
Potassium: 4.2 mEq/L (ref 3.5–5.1)
Sodium: 138 mEq/L (ref 135–145)
Total Bilirubin: 0.6 mg/dL (ref 0.3–1.2)
Total Protein: 7.7 g/dL (ref 6.0–8.3)

## 2012-12-30 LAB — POCT URINALYSIS DIPSTICK
Bilirubin, UA: NEGATIVE
Blood, UA: NEGATIVE
Glucose, UA: NEGATIVE
Ketones, UA: NEGATIVE
Leukocytes, UA: NEGATIVE
Nitrite, UA: NEGATIVE
Protein, UA: NEGATIVE
Spec Grav, UA: <=1.005
Urobilinogen, UA: 0.2
pH, UA: 6

## 2012-12-30 LAB — TSH: TSH: 1.95 u[IU]/mL (ref 0.35–5.50)

## 2012-12-30 LAB — HEMOGLOBIN A1C: Hgb A1c MFr Bld: 6 % (ref 4.6–6.5)

## 2012-12-30 LAB — T4, FREE: Free T4: 0.85 ng/dL (ref 0.60–1.60)

## 2012-12-30 NOTE — Progress Notes (Signed)
Pre-visit discussion using our clinic review tool. No additional management support is needed unless otherwise documented below in the visit note.  Subjective:    Patient ID: Teresa Jensen, female    DOB: 10-Aug-1945, 67 y.o.   MRN: [id number]  67 yo very pleasant female well known to me here for several complaints today.  She has noted some dizziness for past several days.  She does not feel this is worsened by exertion.  No CP or SOB.  Feels like "she is drifting and could pass out."  Has not fainted.  Denies sensation that room is spinning.  Has been taking her synthroid regularly.  Lab Results  Component Value Date   TSH 1.65 08/29/2011   Denies any symptoms of hypo or hyperthyroidism.  Has noticed some intermittent flank pain.  No dysuria.  No increased urinary frequency.  No hematuria.  Recently complete course of valtrex for shingles near her breast.  Past Medical History  Diagnosis Date  . Anxiety   . COPD (chronic obstructive pulmonary disease)   . GERD (gastroesophageal reflux disease)   . Tobacco abuse   . Osteopenia   . Strain of chest wall 01/20/2011  . PMR (polymyalgia rheumatica)   . New onset of headaches      Review of Systems Per HPI No CP ?+/- "low blood sugar"    Objective:   Physical Exam  BP 126/70  Pulse 80  Temp(Src) 97.6 F (36.4 C) (Oral)  Ht 4\' 11"  (1.499 m)  Wt 185 lb 8 oz (84.142 kg)  BMI 37.45 kg/m2  SpO2 95%  Constitutional: She appears well-developed and well-nourished. No distress.  HENT:  Mouth/Throat: Oropharynx is clear and moist. No oropharyngeal exudate.  Eyes: Conjunctivae and EOM are normal. Pupils are equal, round, and reactive to light. No scleral icterus.  Cardiovascular: Normal rate, regular rhythm and intact distal pulses.   2/6 SEM Pulmonary/Chest: Effort normal and breath sounds normal. No respiratory distress. She has no wheezes. Abdominal: Soft. Normal appearance and bowel sounds are normal. She exhibits no  distension and no mass. There is no tenderness. There is no rigidity, no rebound, no guarding, no CVA tenderness and negative Murphy's sign.  Musculoskeletal: She exhibits no edema.  Neuro:  CN II- XII intact.  Normal gait.    Assessment & Plan:  1. Flank pain UA neg.  Will continue to monitor.  If symptoms do not resolve, will get further work up. - Urinalysis Dipstick  2. Dizziness and giddiness New- diff dx wide.  Will start with lab work and echo.  See below. - Comprehensive metabolic panel - 2D Echocardiogram without contrast; Future  3. Unspecified hypothyroidism See above.  Continue synthroid. - TSH - T4, Free  4. Hypoglycemia  - Hemoglobin A1c  5. OSTEOPENIA Stopped fosamax due to reflux per GI recs.  Will recheck DEXA at next wellness visit.  We did discuss other options, like prolia today.  6. Heart murmur ?worsening.  Will order 2 decho for further evaluation. - 2D Echocardiogram without contrast; Future

## 2012-12-30 NOTE — Patient Instructions (Signed)
Good to see you. We will call you with your lab results. Shirlee Limerick will call you with an appointment for an ultrasound of your heart.

## 2013-01-08 ENCOUNTER — Other Ambulatory Visit: Payer: Self-pay | Admitting: Family Medicine

## 2013-01-16 ENCOUNTER — Ambulatory Visit (HOSPITAL_COMMUNITY): Payer: Medicare Other | Attending: Cardiovascular Disease | Admitting: Radiology

## 2013-01-16 ENCOUNTER — Other Ambulatory Visit (HOSPITAL_COMMUNITY): Payer: Medicare Other

## 2013-01-16 ENCOUNTER — Encounter: Payer: Self-pay | Admitting: Cardiovascular Disease

## 2013-01-16 ENCOUNTER — Other Ambulatory Visit (HOSPITAL_COMMUNITY): Payer: Self-pay | Admitting: Family Medicine

## 2013-01-16 DIAGNOSIS — I359 Nonrheumatic aortic valve disorder, unspecified: Secondary | ICD-10-CM | POA: Insufficient documentation

## 2013-01-16 DIAGNOSIS — F17200 Nicotine dependence, unspecified, uncomplicated: Secondary | ICD-10-CM | POA: Insufficient documentation

## 2013-01-16 DIAGNOSIS — Z6838 Body mass index (BMI) 38.0-38.9, adult: Secondary | ICD-10-CM | POA: Insufficient documentation

## 2013-01-16 DIAGNOSIS — R011 Cardiac murmur, unspecified: Secondary | ICD-10-CM

## 2013-01-16 DIAGNOSIS — R42 Dizziness and giddiness: Secondary | ICD-10-CM

## 2013-01-16 DIAGNOSIS — Z8249 Family history of ischemic heart disease and other diseases of the circulatory system: Secondary | ICD-10-CM | POA: Insufficient documentation

## 2013-01-16 DIAGNOSIS — J4489 Other specified chronic obstructive pulmonary disease: Secondary | ICD-10-CM | POA: Insufficient documentation

## 2013-01-16 DIAGNOSIS — J449 Chronic obstructive pulmonary disease, unspecified: Secondary | ICD-10-CM

## 2013-01-16 NOTE — Progress Notes (Signed)
Echocardiogram performed.  

## 2013-03-11 ENCOUNTER — Other Ambulatory Visit: Payer: Self-pay | Admitting: Orthopedic Surgery

## 2013-03-11 DIAGNOSIS — M25512 Pain in left shoulder: Secondary | ICD-10-CM

## 2013-03-21 ENCOUNTER — Other Ambulatory Visit: Payer: Medicare Other

## 2013-03-28 ENCOUNTER — Ambulatory Visit
Admission: RE | Admit: 2013-03-28 | Discharge: 2013-03-28 | Disposition: A | Discharge status: Home / Self Care | Payer: Medicare Other | Source: Ambulatory Visit | Attending: Orthopedic Surgery | Admitting: Orthopedic Surgery

## 2013-03-28 DIAGNOSIS — M25512 Pain in left shoulder: Secondary | ICD-10-CM

## 2013-04-15 ENCOUNTER — Telehealth: Payer: Self-pay | Admitting: Family Medicine

## 2013-04-15 NOTE — Telephone Encounter (Signed)
Please ask her if she has had shingles vaccine and pneumovax.  If not, please offer them to her.

## 2013-04-15 NOTE — Telephone Encounter (Signed)
Patient received the EMMI call about making sure she was up to date on all tests and screenings.  Patient didn't like the call.  She wants to know why they're asking her if she's up to date.  She said the doctor's office should let her know if she's up to date.  Patient wants to know if everything is up to date.

## 2013-04-15 NOTE — Telephone Encounter (Signed)
Lm on pts vm requesting a call back 

## 2013-04-17 NOTE — Telephone Encounter (Signed)
Lm on pts vm requesting a call back 

## 2013-04-17 NOTE — Telephone Encounter (Signed)
Spoke to pt and informed her that she was able to receive both pneumonia and shingles vaccine. Pt states that Medicare does not pay for the shingles vaccine and will receive the pneumonia at her next ov

## 2013-04-28 ENCOUNTER — Ambulatory Visit (INDEPENDENT_AMBULATORY_CARE_PROVIDER_SITE_OTHER)
Admission: RE | Admit: 2013-04-28 | Discharge: 2013-04-28 | Disposition: A | Discharge status: Home / Self Care | Payer: Medicare Other | Source: Ambulatory Visit | Attending: Family Medicine | Admitting: Family Medicine

## 2013-04-28 ENCOUNTER — Ambulatory Visit (INDEPENDENT_AMBULATORY_CARE_PROVIDER_SITE_OTHER): Payer: Medicare Other | Admitting: Family Medicine

## 2013-04-28 ENCOUNTER — Encounter: Payer: Self-pay | Admitting: Family Medicine

## 2013-04-28 VITALS — BP 136/72 | HR 80 | Temp 98.1°F | Ht 59.0 in | Wt 184.5 lb

## 2013-04-28 DIAGNOSIS — M75102 Unspecified rotator cuff tear or rupture of left shoulder, not specified as traumatic: Secondary | ICD-10-CM | POA: Insufficient documentation

## 2013-04-28 DIAGNOSIS — R042 Hemoptysis: Secondary | ICD-10-CM | POA: Insufficient documentation

## 2013-04-28 DIAGNOSIS — F411 Generalized anxiety disorder: Secondary | ICD-10-CM

## 2013-04-28 DIAGNOSIS — M719 Bursopathy, unspecified: Secondary | ICD-10-CM

## 2013-04-28 DIAGNOSIS — J309 Allergic rhinitis, unspecified: Secondary | ICD-10-CM | POA: Insufficient documentation

## 2013-04-28 DIAGNOSIS — S43429A Sprain of unspecified rotator cuff capsule, initial encounter: Secondary | ICD-10-CM

## 2013-04-28 DIAGNOSIS — M67919 Unspecified disorder of synovium and tendon, unspecified shoulder: Secondary | ICD-10-CM

## 2013-04-28 MED ORDER — ALPRAZOLAM 0.25 MG PO TABS: 0.2500 mg | ORAL_TABLET | Freq: Three times a day (TID) | ORAL | Status: DC | PRN

## 2013-04-28 NOTE — Addendum Note (Signed)
Addended by: Lucille Passy on: 04/28/2013 09:59 AM   Modules accepted: Orders

## 2013-04-28 NOTE — Progress Notes (Signed)
Pre visit review using our clinic review tool, if applicable. No additional management support is needed unless otherwise documented below in the visit note. 

## 2013-04-28 NOTE — Assessment & Plan Note (Signed)
Will talk with Dr. Lorelei Pont to find out who he recommends for shoulder issues. The patient indicates understanding of these issues and agrees with the plan.

## 2013-04-28 NOTE — Assessment & Plan Note (Signed)
CXR

## 2013-04-28 NOTE — Assessment & Plan Note (Signed)
Deteriorated but feels she is coping ok. Asks for xanax to refilled.   She has had this refilled very infrequently.  Rx refilled. Call or return to clinic prn if these symptoms worsen or fail to improve as anticipated.

## 2013-04-28 NOTE — Patient Instructions (Signed)
I will call you with your xray results.  Please get a cool mist humidifier and place by your bed at night.  I will let you know when I hear from Dr. Lorelei Pont.

## 2013-04-28 NOTE — Progress Notes (Addendum)
Subjective:   Patient ID: Teresa Jensen, female    DOB: 09/10/1945, 68 y.o.   MRN: [id number]  Teresa Jensen is a pleasant 68 y.o. year old female who presents to clinic today with Allergies  on 04/28/2013  HPI: Wants to discuss several issues.  Anxiety- son is getting divorced.  They are moving in with her.  This has been causing increased stressors and anxiety.  Feels she is more anxious but otherwise coping ok.  Denies depression.  Sleeping ok.  Appetite good.  Weight stable. Wt Readings from Last 3 Encounters:  04/28/13 184 lb 8 oz (83.689 kg)  12/30/12 185 lb 8 oz (84.142 kg)  11/21/12 183 lb 12 oz (83.348 kg)   Mucous in throat- blood tinged at least twice a week for for over a month.  No SOB or CP.  She is a former smoker.  Left rotator cuff tear- seeing Dr. Noemi Chapel.  Brings MRI report with her.  Shows tiny tear.  She is going through PT and symptoms have resolved.  She is questioning if she truly needs surgery and would like a second opinion.  Patient Active Problem List   Diagnosis Date Noted  . Allergic rhinitis 04/28/2013  . Rotator cuff tear, left 04/28/2013  . Hemoptysis 04/28/2013  . Dizziness and giddiness 12/30/2012  . Right-sided chest wall pain 11/21/2012  . Fatigue 08/29/2011  . Dependent edema 05/22/2011  . Sedimentation rate elevation 12/07/2010  . COPD (chronic obstructive pulmonary disease) 06/05/2010  . Unspecified hypothyroidism 09/29/2009  . ROTATOR CUFF INJURY, LEFT SHOULDER 09/29/2009  . ANXIETY 10/01/2008  . GERD 10/01/2008  . OSTEOPENIA 10/01/2008  . HEADACHE 10/01/2008   Past Medical History  Diagnosis Date  . Anxiety   . COPD (chronic obstructive pulmonary disease)   . GERD (gastroesophageal reflux disease)   . Tobacco abuse   . Osteopenia   . Strain of chest wall 01/20/2011  . PMR (polymyalgia rheumatica)   . New onset of headaches    Past Surgical History  Procedure Laterality Date  . Abdominal hysterectomy    . Lumbar fusion     . Appendectomy    . Cholecystectomy    . Hemorrhoid surgery    . Tubal ligation    . Cervical fusion     History  Substance Use Topics  . Smoking status: Former Smoker -- 1.50 packs/day for 40 years    Types: Cigarettes    Quit date: 02/11/2009  . Smokeless tobacco: Never Used  . Alcohol Use: No   Family History  Problem Relation Age of Onset  . Heart disease Mother   . Cancer Father     lung/chest wall   . Diabetes Brother     siblings  . Hypertension Sister     x 2  . Heart disease Brother   . Breast cancer Paternal Grandmother   . Heart disease Sister   . Diabetes Sister   . Tuberculosis Sister   . Prostate cancer Father   . Cirrhosis Brother    Allergies  Allergen Reactions  . Prednisone     REACTION: hives, difficulty breathing, facial swelling.    Current Outpatient Prescriptions on File Prior to Visit  Medication Sig Dispense Refill  . aspirin 81 MG tablet Take 81 mg by mouth every other day.       . Calcium Citrate-Vitamin D (GNP CALCIUM CITRATE +D3 PO) Take 1 tablet by mouth daily.      Marland Kitchen levothyroxine (SYNTHROID, LEVOTHROID) 75 MCG  tablet TAKE 1 TABLET BY MOUTH DAILY  30 tablet  5   No current facility-administered medications on file prior to visit.   The PMH, PSH, Social History, Family History, Medications, and allergies have been reviewed in [facility], and have been updated if relevant.   Review of Systems    See HPI No fevers, chills or night sweats. Objective:    BP 136/72  Pulse 80  Temp(Src) 98.1 F (36.7 C) (Oral)  Ht 4\' 11"  (1.499 m)  Wt 184 lb 8 oz (83.689 kg)  BMI 37.24 kg/m2  SpO2 95%   Physical Exam  Nursing note and vitals reviewed. Constitutional: She appears well-developed and well-nourished. No distress.  HENT:  Head: Normocephalic and atraumatic.  Cardiovascular: Normal rate and regular rhythm.   Pulmonary/Chest: Effort normal and breath sounds normal. No respiratory distress. She has no wheezes. She has no rales. She  exhibits no tenderness.  Skin: Skin is warm and dry.  Psychiatric: She has a normal mood and affect. Her behavior is normal. Judgment and thought content normal.          Assessment & Plan:   ANXIETY  Allergic rhinitis  Rotator cuff tear, left  Hemoptysis - Plan: DG Chest 2 View No Follow-up on file.

## 2013-06-15 ENCOUNTER — Other Ambulatory Visit: Payer: Self-pay

## 2013-06-15 DIAGNOSIS — Z1231 Encounter for screening mammogram for malignant neoplasm of breast: Secondary | ICD-10-CM

## 2013-06-27 ENCOUNTER — Other Ambulatory Visit: Payer: Self-pay | Admitting: Family Medicine

## 2013-07-06 ENCOUNTER — Encounter (INDEPENDENT_AMBULATORY_CARE_PROVIDER_SITE_OTHER): Payer: Self-pay

## 2013-07-06 ENCOUNTER — Ambulatory Visit
Admission: RE | Admit: 2013-07-06 | Discharge: 2013-07-06 | Disposition: A | Discharge status: Home / Self Care | Payer: Medicare Other | Source: Ambulatory Visit

## 2013-07-06 DIAGNOSIS — Z1231 Encounter for screening mammogram for malignant neoplasm of breast: Secondary | ICD-10-CM

## 2013-07-27 ENCOUNTER — Other Ambulatory Visit: Payer: Self-pay | Admitting: Family Medicine

## 2013-07-30 ENCOUNTER — Encounter: Payer: Self-pay | Admitting: Family Medicine

## 2013-07-30 ENCOUNTER — Ambulatory Visit (INDEPENDENT_AMBULATORY_CARE_PROVIDER_SITE_OTHER): Payer: Medicare Other | Admitting: Family Medicine

## 2013-07-30 VITALS — BP 138/78 | HR 76 | Temp 97.9°F | Wt 184.8 lb

## 2013-07-30 DIAGNOSIS — M5481 Occipital neuralgia: Secondary | ICD-10-CM | POA: Insufficient documentation

## 2013-07-30 DIAGNOSIS — E039 Hypothyroidism, unspecified: Secondary | ICD-10-CM

## 2013-07-30 DIAGNOSIS — M531 Cervicobrachial syndrome: Secondary | ICD-10-CM

## 2013-07-30 LAB — TSH: TSH: 0.37 u[IU]/mL (ref 0.35–4.50)

## 2013-07-30 LAB — T4, FREE: Free T4: 1.05 ng/dL (ref 0.60–1.60)

## 2013-07-30 MED ORDER — GABAPENTIN 100 MG PO CAPS: ORAL_CAPSULE | ORAL | Status: DC

## 2013-07-30 NOTE — Patient Instructions (Signed)
Occipital Neuralgia Occipital neuralgia is a type of headache that causes episodes of very bad pain in the back of your head. Pain from occipital neuralgia may spread (radiate) to other parts of your head. The pain is usually brief and often goes away after you rest and relax. These headaches may be caused by irritation of the nerves that leave your spinal cord high up in your neck, just below the base of your skull (occipital nerves). Your occipital nerves transmit sensations from the back of your head, the top of your head, and the areas behind your ears. CAUSES Occipital neuralgia can occur without any known cause (primary headache syndrome). In other cases, occipital neuralgia is caused by pressure on or irritation of one of the two occipital nerves. Causes of occipital nerve compression or irritation include:  Wear and tear of the vertebrae in the neck (osteoarthritis).  Neck injury.  Disease of the disks that separate the vertebrae.  Tumors.  Gout.  Infections.  Diabetes.  Swollen blood vessels that put pressure on the occipital nerves.  Muscle spasm in the neck. SIGNS AND SYMPTOMS Pain is the main symptom of occipital neuralgia. It usually starts in the back of the head but may also be felt in other areas supplied by the occipital nerves. Pain is usually on one side but may be on both sides. You may have:   Brief episodes of very bad pain that is burning, stabbing, shocking, or shooting.  Pain behind the eye.  Pain triggered by neck movement or hair brushing.  Scalp tenderness.  Aching in the back of the head between episodes of very bad pain. DIAGNOSIS  Your health care provider may diagnose occipital neuralgia based on your symptoms and a physical exam. During the exam, the health care provider may push on areas supplied by the occipital nerves to see if they are painful. Some tests may also be done to help in making the diagnosis. These may include:  Imaging studies of  the upper spinal cord, such as an MRI or CT scan. These may show compression or spinal cord abnormalities.  Nerve block. You will get an injection of numbing medicine (local anesthetic) near the occipital nerve to see if this relieves pain. TREATMENT  Treatment may begin with simple measures, such as:   Rest.  Massage.  Heat.  Over-the-counter pain relievers. If these measures do not work, you may need other treatments, including:  Medicines such as:  Prescription-strength anti-inflammatory medicines.  Muscle relaxants.  Antiseizure medicines.  Antidepressants.  Steroid injection. This involves injections of local anesthetic and strong anti-inflammatory drugs (steroids).  Pulsed radiofrequency. Wires are implanted to deliver electrical impulses that block pain signals from the occipital nerve.  Physical therapy.  Surgery to relieve nerve pressure. HOME CARE INSTRUCTIONS  Take all medicines as directed by your health care provider.  Avoid activities that cause pain.  Rest when you have an attack of pain.  Try gentle massage or a heating pad to relieve pain.  Work with a physical therapist to learn stretching exercises you can do at home.  Try a different pillow or sleeping position.  Practice good posture.  Try to stay active. Get regular exercise that does not cause pain. Ask your health care provider to suggest safe exercises for you.  Keep all follow-up visits as directed by your health care provider. This is important. SEEK MEDICAL CARE IF:  Your medicine is not working.  You have new or worsening symptoms. SEEK IMMEDIATE MEDICAL CARE   IF:  You have very bad head pain that is not going away.  You have a sudden change in vision, balance, or speech. MAKE SURE YOU:  Understand these instructions.  Will watch your condition.  Will get help right away if you are not doing well or get worse. Document Released: 12/12/2000 Document Revised: 05/04/2013  Document Reviewed: 12/10/2012 [facility] Patient Information 2015 [place], [place]. This information is not intended to replace advice given to you by your health care provider. Make sure you discuss any questions you have with your health care provider.

## 2013-07-30 NOTE — Progress Notes (Signed)
Pre visit review using our clinic review tool, if applicable. No additional management support is needed unless otherwise documented below in the visit note. 

## 2013-07-30 NOTE — Assessment & Plan Note (Signed)
With h/o cervicogenic HA. Discussed applying heat. She does not want to consider nerve block. eRx sent for gabapentin.  She will update me in 2 weeks. Call or return to clinic prn if these symptoms worsen or fail to improve as anticipated.

## 2013-07-30 NOTE — Progress Notes (Signed)
Subjective:   Patient ID: Teresa Jensen, female    DOB: 1945/08/17, 68 y.o.   MRN: [id number]  Teresa Jensen is a pleasant 68 y.o. year old female who presents to clinic today with pain in head  on 07/30/2013  HPI:  H/o PMR.  I saw her for HA in 09/2012- mainly back of her head that radiated to top of her head.  No temple pain, photophobia or other focal neurological deficits at that time.  Referred her to neuro given her complicated history.  Saw Dr. 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 She does have history of neck surgery and is exhibiting neck pain.  PLAN:  We will try PT first. If ineffective, will have to turn to other management and possible imaging. Follow up in 2 months.  Last week, developed right sided head pain, tender to touch.  No photophobia, tearing, blurred vision or other symptoms.  Current Outpatient Prescriptions on File Prior to Visit  Medication Sig Dispense Refill  . aspirin 81 MG tablet Take 81 mg by mouth every other day.       . Calcium Citrate-Vitamin D (GNP CALCIUM CITRATE +D3 PO) Take 1 tablet by mouth daily.      Marland Kitchen levothyroxine (SYNTHROID, LEVOTHROID) 75 MCG tablet TAKE 1 TABLET BY MOUTH DAILY- OFFICE VISITS WITH LABS REQUIRED FOR ADD'L REFILLS  30 tablet  0   No current facility-administered medications on file prior to visit.    Allergies  Allergen Reactions  . Prednisone     REACTION: hives, difficulty breathing, facial swelling.     Past Medical History  Diagnosis Date  . Anxiety   . COPD (chronic obstructive pulmonary disease)   . GERD (gastroesophageal reflux disease)   . Tobacco abuse   . Osteopenia   . Strain of chest wall 01/20/2011  . PMR (polymyalgia rheumatica)   . New onset of headaches     Past Surgical History  Procedure Laterality Date  . Abdominal hysterectomy    . Lumbar fusion    . Appendectomy    . Cholecystectomy    . Hemorrhoid surgery     . Tubal ligation    . Cervical fusion      Family History  Problem Relation Age of Onset  . Heart disease Mother   . Cancer Father     lung/chest wall   . Diabetes Brother     siblings  . Hypertension Sister     x 2  . Heart disease Brother   . Breast cancer Paternal Grandmother   . Heart disease Sister   . Diabetes Sister   . Tuberculosis Sister   . Prostate cancer Father   . Cirrhosis Brother     History   Social History  . Marital Status: Married    Spouse Name: N/A    Number of Children: 1  . Years of Education: N/A   Occupational History  . [employer] as a Forensic psychologist     retired   Social History Main Topics  . Smoking status: Former Smoker -- 1.50 packs/day for 40 years    Types: Cigarettes    Quit date: 02/11/2009  . Smokeless tobacco: Never Used  . Alcohol Use: No  . Drug Use: No  . Sexual Activity: Not on file   Other Topics Concern  . Not on file   Social History Narrative   Lives in [place] with her husband.  Takes care of her two grandchildren, ages 1 and 2, everyday in [place].  Has one son who is 63.   The PMH, PSH, Social History, Family History, Medications, and allergies have been reviewed in [facility], and have been updated if relevant.   Review of Systems See HPI    Objective:    BP 138/78  Pulse 76  Temp(Src) 97.9 F (36.6 C) (Oral)  Wt 184 lb 12 oz (83.802 kg)  SpO2 93%   Physical Exam  Gen:  Alert, pleasant, NAD Neuro: tinnel pos -right occipital CNII- XII grossly intact Gait normal      Assessment & Plan:   Unspecified hypothyroidism - Plan: TSH, T4, Free  Occipital neuralgia of right side No Follow-up on file.

## 2013-08-03 ENCOUNTER — Encounter: Payer: Self-pay | Admitting: *Deleted

## 2013-08-26 ENCOUNTER — Other Ambulatory Visit: Payer: Self-pay | Admitting: Family Medicine

## 2013-09-08 ENCOUNTER — Encounter: Payer: Self-pay | Admitting: Family Medicine

## 2013-09-08 ENCOUNTER — Ambulatory Visit (INDEPENDENT_AMBULATORY_CARE_PROVIDER_SITE_OTHER): Payer: Medicare Other | Admitting: Family Medicine

## 2013-09-08 VITALS — BP 128/76 | HR 76 | Temp 98.1°F | Ht 59.0 in | Wt 181.2 lb

## 2013-09-08 DIAGNOSIS — R21 Rash and other nonspecific skin eruption: Secondary | ICD-10-CM

## 2013-09-08 DIAGNOSIS — R220 Localized swelling, mass and lump, head: Secondary | ICD-10-CM

## 2013-09-08 DIAGNOSIS — R221 Localized swelling, mass and lump, neck: Secondary | ICD-10-CM

## 2013-09-08 DIAGNOSIS — L539 Erythematous condition, unspecified: Secondary | ICD-10-CM | POA: Insufficient documentation

## 2013-09-08 MED ORDER — CLINDAMYCIN HCL 300 MG PO CAPS: 300.0000 mg | ORAL_CAPSULE | Freq: Four times a day (QID) | ORAL | Status: AC

## 2013-09-08 NOTE — Progress Notes (Signed)
Subjective:   Patient ID: Teresa Jensen, female    DOB: March 23, 1945, 68 y.o.   MRN: [id number]  Teresa Jensen is a pleasant 68 y.o. year old female who presents to clinic today with rash on face  on 09/08/2013  HPI:  Acute onset of rash on face 3 days ago.  Woke up Saturday morning and face felt itchy, tight and swollen.  Red, mildly raised rash on checks (right >left), forehead and chin. Went to UC that night- per pt, was told to take Benadryl and Ranitidine.  Itching a little better but rash has not improved at all.  Now left cheek is very swollen. No fever. No difficulty swallowing.  The night before this occurred, she was around someone who was burning wood/leaves.  Current Outpatient Prescriptions on File Prior to Visit  Medication Sig Dispense Refill  . aspirin 81 MG tablet Take 81 mg by mouth every other day.       . Calcium Citrate-Vitamin D (GNP CALCIUM CITRATE +D3 PO) Take 1 tablet by mouth daily.      Marland Kitchen gabapentin (NEURONTIN) 100 MG capsule 1 capsule by mouth nightly, may increase to 3 capsules if tolerated after a week.  90 capsule  0  . levothyroxine (SYNTHROID, LEVOTHROID) 75 MCG tablet TAKE 1 TABLET BY MOUTH DAILY- OFFICE VISITS WITH LABS REQUIRED FOR ADD'L REFILLS  30 tablet  5   No current facility-administered medications on file prior to visit.    Allergies  Allergen Reactions  . Prednisone     REACTION: hives, difficulty breathing, facial swelling.     Past Medical History  Diagnosis Date  . Anxiety   . COPD (chronic obstructive pulmonary disease)   . GERD (gastroesophageal reflux disease)   . Tobacco abuse   . Osteopenia   . Strain of chest wall 01/20/2011  . PMR (polymyalgia rheumatica)   . New onset of headaches     Past Surgical History  Procedure Laterality Date  . Abdominal hysterectomy    . Lumbar fusion    . Appendectomy    . Cholecystectomy    . Hemorrhoid surgery    . Tubal ligation    . Cervical fusion      Family History    Problem Relation Age of Onset  . Heart disease Mother   . Cancer Father     lung/chest wall   . Diabetes Brother     siblings  . Hypertension Sister     x 2  . Heart disease Brother   . Breast cancer Paternal Grandmother   . Heart disease Sister   . Diabetes Sister   . Tuberculosis Sister   . Prostate cancer Father   . Cirrhosis Brother     History   Social History  . Marital Status: Married    Spouse Name: N/A    Number of Children: 1  . Years of Education: N/A   Occupational History  . [employer] as a Forensic psychologist     retired   Social History Main Topics  . Smoking status: Former Smoker -- 1.50 packs/day for 40 years    Types: Cigarettes    Quit date: 02/11/2009  . Smokeless tobacco: Never Used  . Alcohol Use: No  . Drug Use: No  . Sexual Activity: Not on file   Other Topics Concern  . Not on file   Social History Narrative   Lives in [place] with her husband.  Takes care of her two grandchildren, ages 58  and 2, everyday in [place].  Has one son who is 58.   The PMH, PSH, Social History, Family History, Medications, and allergies have been reviewed in [facility], and have been updated if relevant.   Review of Systems    See HPI No wheezing No SOB No CP Objective:    BP 128/76  Pulse 76  Temp(Src) 98.1 F (36.7 C) (Oral)  Ht 4\' 11"  (1.499 m)  Wt 181 lb 4 oz (82.214 kg)  BMI 36.59 kg/m2  SpO2 97%   Physical Exam  Nursing note and vitals reviewed. Constitutional: She appears well-developed and well-nourished. No distress.  HENT:  Head: Normocephalic.  Mouth/Throat: Uvula is midline.  ? Dental abscess left upper molar  Skin: Skin is warm and dry. Rash noted. Rash is macular.  Left check swollen, fluctuant, mild warmth Rash evident on cheeks, forehead and chin, macular  Psychiatric: She has a normal mood and affect. Her behavior is normal. Judgment and thought content normal.          Assessment & Plan:   Rash and nonspecific skin  eruption - Plan: Ambulatory referral to Dermatology  Facial swelling - Plan: Ambulatory referral to Dermatology No Follow-up on file.

## 2013-09-08 NOTE — Assessment & Plan Note (Signed)
New- cannot take steroids- anaphlyactic allergy. Etiology unclear at this point but does appear to still be in contact if this is allergic since no improvement in rash. She has since washed her pillow cases and sheets. Will refer her ASAP to her dermatologist. Start Clindamycin since left cheek does appear cellulitic and ?tooth abscess.

## 2013-09-08 NOTE — Patient Instructions (Signed)
Good to see you. Please stop by to see Rosaria Ferries on your way out. Take clindamycin as directed. Continue Benadryl and ranitidine. Please come see me in 2 days.

## 2013-09-08 NOTE — Assessment & Plan Note (Signed)
New- Unclear etiology but will cover for dental abscess with clindamycin.  This should also treat skin infection. Follow up with me in 2 days. She will call dentist ASAP

## 2013-09-10 ENCOUNTER — Ambulatory Visit: Payer: Medicare Other | Admitting: Family Medicine

## 2013-09-10 NOTE — Progress Notes (Signed)
Pre visit review using our clinic review tool, if applicable. No additional management support is needed unless otherwise documented below in the visit note. 

## 2013-11-09 ENCOUNTER — Encounter: Payer: Self-pay | Admitting: Family Medicine

## 2013-11-09 ENCOUNTER — Ambulatory Visit (INDEPENDENT_AMBULATORY_CARE_PROVIDER_SITE_OTHER): Payer: Medicare Other | Admitting: Family Medicine

## 2013-11-09 VITALS — BP 126/76 | HR 82 | Temp 98.1°F | Wt 182.0 lb

## 2013-11-09 DIAGNOSIS — M5481 Occipital neuralgia: Secondary | ICD-10-CM

## 2013-11-09 MED ORDER — ALPRAZOLAM 0.25 MG PO TABS: 0.2500 mg | ORAL_TABLET | Freq: Three times a day (TID) | ORAL | Status: AC | PRN

## 2013-11-09 NOTE — Patient Instructions (Signed)
Great to see you. Please be cautious with alprazolam- it can be habit forming and sedating.  Call your insurance company about message therapy.

## 2013-11-09 NOTE — Progress Notes (Signed)
Subjective:   Patient ID: Teresa Jensen, female    DOB: 01-Aug-1945, 68 y.o.   MRN: [id number]  Teresa Jensen is a pleasant 68 y.o. year old female who presents to clinic today with Headache  on 11/09/2013  HPI:  H/o PMR.  I saw her for HA in 09/2012- mainly back of her head that radiated to top of her head.  No temple pain, photophobia or other focal neurological deficits at that time.  Referred her to neuro given her complicated history.  Saw Dr. 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 She does have history of neck surgery and is exhibiting neck pain.  PLAN:  We will try PT first. If ineffective, will have to turn to other management and possible imaging. Follow up in 2 months.  I saw her in July for this complaint.  She did not want to consider nerve block.  Did agree to a trial of gabapentin- eRx sent for Gabapentin 100 mg nightly with option to increase to 3 capsules nightly if tolerated well. She stopped taking it because she could not tell any improvement. Alprazolam 1/2 of of 0.25 mg tablet seemed to help more than anything else.  Headache unchanged- remains right sided head pain, tender to touch. No photophobia, tearing, blurred vision or other symptoms.  Current Outpatient Prescriptions on File Prior to Visit  Medication Sig Dispense Refill  . aspirin 81 MG tablet Take 81 mg by mouth every other day.     . Calcium Citrate-Vitamin D (GNP CALCIUM CITRATE +D3 PO) Take 1 tablet by mouth daily.    Marland Kitchen gabapentin (NEURONTIN) 100 MG capsule 1 capsule by mouth nightly, may increase to 3 capsules if tolerated after a week. 90 capsule 0  . levothyroxine (SYNTHROID, LEVOTHROID) 75 MCG tablet TAKE 1 TABLET BY MOUTH DAILY- OFFICE VISITS WITH LABS REQUIRED FOR ADD'L REFILLS 30 tablet 5   No current facility-administered medications on file prior to visit.    Allergies  Allergen Reactions  . Prednisone    REACTION: hives, difficulty breathing, facial swelling.     Past Medical History  Diagnosis Date  . Anxiety   . COPD (chronic obstructive pulmonary disease)   . GERD (gastroesophageal reflux disease)   . Tobacco abuse   . Osteopenia   . Strain of chest wall 01/20/2011  . PMR (polymyalgia rheumatica)   . New onset of headaches     Past Surgical History  Procedure Laterality Date  . Abdominal hysterectomy    . Lumbar fusion    . Appendectomy    . Cholecystectomy    . Hemorrhoid surgery    . Tubal ligation    . Cervical fusion      Family History  Problem Relation Age of Onset  . Heart disease Mother   . Cancer Father     lung/chest wall   . Diabetes Brother     siblings  . Hypertension Sister     x 2  . Heart disease Brother   . Breast cancer Paternal Grandmother   . Heart disease Sister   . Diabetes Sister   . Tuberculosis Sister   . Prostate cancer Father   . Cirrhosis Brother     History   Social History  . Marital Status: Married    Spouse Name: N/A    Number of Children: 1  . Years of Education: N/A   Occupational History  . [employer] as  a Forensic psychologist     retired   Social History Main Topics  . Smoking status: Former Smoker -- 1.50 packs/day for 40 years    Types: Cigarettes    Quit date: 02/11/2009  . Smokeless tobacco: Never Used  . Alcohol Use: No  . Drug Use: No  . Sexual Activity: Not on file   Other Topics Concern  . Not on file   Social History Narrative   Lives in [place] with her husband.  Takes care of her two grandchildren, ages 68 and 2, everyday in [place].  Has one son who is 68.   The PMH, PSH, Social History, Family History, Medications, and allergies have been reviewed in [facility], and have been updated if relevant.   Review of Systems See HPI    Does feel some "tightness" in her right arm/shoulder pain Objective:    BP 126/76 mmHg  Pulse 82  Temp(Src) 98.1 F (36.7 C) (Oral)  Wt 182 lb (82.555 kg)  SpO2  94%   Physical Exam  Gen:  Alert, pleasant, NAD Neuro: tinnel pos -right occipital CNII- XII grossly intact Gait normal     Assessment & Plan:   Occipital neuralgia of right side No Follow-up on file.

## 2013-11-09 NOTE — Progress Notes (Signed)
Pre visit review using our clinic review tool, if applicable. No additional management support is needed unless otherwise documented below in the visit note. 

## 2013-11-09 NOTE — Assessment & Plan Note (Signed)
Persistent. She does not want to try increased dose of Gabapentin. She is aware of alprazolam addiction and sedation potential. rx refilled. Advised massage therapy- Rx written- advised she could try to get covered by insurance.

## 2013-12-17 ENCOUNTER — Ambulatory Visit (INDEPENDENT_AMBULATORY_CARE_PROVIDER_SITE_OTHER): Payer: Medicare Other | Admitting: Family Medicine

## 2013-12-17 ENCOUNTER — Encounter: Payer: Self-pay | Admitting: Family Medicine

## 2013-12-17 ENCOUNTER — Ambulatory Visit: Payer: Medicare Other | Admitting: Family Medicine

## 2013-12-17 VITALS — BP 130/70 | HR 89 | Temp 98.1°F | Wt 179.5 lb

## 2013-12-17 DIAGNOSIS — M25531 Pain in right wrist: Secondary | ICD-10-CM | POA: Insufficient documentation

## 2013-12-17 NOTE — Progress Notes (Signed)
Pre visit review using our clinic review tool, if applicable. No additional management support is needed unless otherwise documented below in the visit note. 

## 2013-12-17 NOTE — Assessment & Plan Note (Signed)
New- consistent with deQuervain's tendonitis although she is unaware of any recent or new repetitive movements.   Given thumb spica splint as well as discussing duration and course of this type of tendonitis. Given hand out from sports med advisor which also discusses this and home exercises she can do. Call or return to clinic prn if these symptoms worsen or fail to improve as anticipated. The patient indicates understanding of these issues and agrees with the plan.

## 2013-12-17 NOTE — Progress Notes (Signed)
Subjective:   Patient ID: Teresa Jensen, female    DOB: 06-Aug-1945, 68 y.o.   MRN: [id number]  Teresa Jensen is a pleasant 68 y.o. year old female who presents to clinic today with Wrist Pain  on 12/17/2013  HPI:  Acute onset of right sided wrist pain, has been waking her up front sleep. No known injuries. No recent hard work.  Has been watching her grandchildren.  Extending and lifting her wrist is most painful.  Has not tried anything for it. PMH significant for allergy to prednisone.  No swelling of wrist or fingers.  Current Outpatient Prescriptions on File Prior to Visit  Medication Sig Dispense Refill  . aspirin 81 MG tablet Take 81 mg by mouth every other day.     . Calcium Citrate-Vitamin D (GNP CALCIUM CITRATE +D3 PO) Take 1 tablet by mouth daily.    Marland Kitchen levothyroxine (SYNTHROID, LEVOTHROID) 75 MCG tablet TAKE 1 TABLET BY MOUTH DAILY- OFFICE VISITS WITH LABS REQUIRED FOR ADD'L REFILLS 30 tablet 5   No current facility-administered medications on file prior to visit.    Allergies  Allergen Reactions  . Prednisone     REACTION: hives, difficulty breathing, facial swelling.     Past Medical History  Diagnosis Date  . Anxiety   . COPD (chronic obstructive pulmonary disease)   . GERD (gastroesophageal reflux disease)   . Tobacco abuse   . Osteopenia   . Strain of chest wall 01/20/2011  . PMR (polymyalgia rheumatica)   . New onset of headaches     Past Surgical History  Procedure Laterality Date  . Abdominal hysterectomy    . Lumbar fusion    . Appendectomy    . Cholecystectomy    . Hemorrhoid surgery    . Tubal ligation    . Cervical fusion      Family History  Problem Relation Age of Onset  . Heart disease Mother   . Cancer Father     lung/chest wall   . Diabetes Brother     siblings  . Hypertension Sister     x 2  . Heart disease Brother   . Breast cancer Paternal Grandmother   . Heart disease Sister   . Diabetes Sister   . Tuberculosis  Sister   . Prostate cancer Father   . Cirrhosis Brother     History   Social History  . Marital Status: Married    Spouse Name: N/A    Number of Children: 1  . Years of Education: N/A   Occupational History  .  Pathology as a Forensic psychologist     retired   Social History Main Topics  . Smoking status: Former Smoker -- 1.50 packs/day for 40 years    Types: Cigarettes    Quit date: 02/11/2009  . Smokeless tobacco: Never Used  . Alcohol Use: No  . Drug Use: No  . Sexual Activity: Not on file   Other Topics Concern  . Not on file   Social History Narrative   Lives in [place] with her husband.  Takes care of her two grandchildren, ages 72 and 2, everyday in [place].  Has one son who is 69.   The PMH, PSH, Social History, Family History, Medications, and allergies have been reviewed in [facility], and have been updated if relevant.   Review of Systems  Constitutional: Negative.   Musculoskeletal: Negative for joint swelling.  Skin: Negative.   Neurological: Negative.   Hematological: Negative.  Psychiatric/Behavioral: Negative.   All other systems reviewed and are negative.      Objective:    BP 130/70 mmHg  Pulse 89  Temp(Src) 98.1 F (36.7 C) (Oral)  Wt 179 lb 8 oz (81.421 kg)  SpO2 92%   Physical Exam  Constitutional: She is oriented to person, place, and time. She appears well-developed and well-nourished. No distress.  HENT:  Head: Normocephalic.  Eyes: Conjunctivae are normal.  Cardiovascular: Normal rate.   Pulmonary/Chest: Effort normal.  Musculoskeletal:       Right wrist: She exhibits no deformity.       Arms: Neurological: She is alert and oriented to person, place, and time. No cranial nerve deficit.  Skin: Skin is warm and dry.  Psychiatric: She has a normal mood and affect. Her speech is normal and behavior is normal. Judgment and thought content normal. Cognition and memory are normal.  Nursing note and vitals reviewed.           Assessment & Plan:   Right wrist pain No Follow-up on file.

## 2013-12-29 ENCOUNTER — Ambulatory Visit (INDEPENDENT_AMBULATORY_CARE_PROVIDER_SITE_OTHER): Payer: Medicare Other | Admitting: Internal Medicine

## 2013-12-29 ENCOUNTER — Encounter: Payer: Self-pay | Admitting: Internal Medicine

## 2013-12-29 VITALS — BP 126/72 | HR 99 | Temp 99.2°F | Wt 182.0 lb

## 2013-12-29 DIAGNOSIS — J441 Chronic obstructive pulmonary disease with (acute) exacerbation: Secondary | ICD-10-CM

## 2013-12-29 MED ORDER — AZITHROMYCIN 250 MG PO TABS: ORAL_TABLET | ORAL | Status: DC

## 2013-12-29 MED ORDER — HYDROCODONE-HOMATROPINE 5-1.5 MG/5ML PO SYRP: 5.0000 mL | ORAL_SOLUTION | Freq: Three times a day (TID) | ORAL | Status: DC | PRN

## 2013-12-29 NOTE — Progress Notes (Signed)
HPI  Pt presents to the clinic today with c/o cough and chest congestion. She reports this started 3 days ago. The cough is productive of thick green/brown mucous. She has run fevers up to 101. She has had associated headache, chills, body aches and shortness of breath. She has tried Tylenol and cough syrup OTC without any relief. She does have a history of allergies and COPD. She has had sick contacts. She has not had her flu or pneumonia shot.  Review of Systems      Past Medical History  Diagnosis Date  . Anxiety   . COPD (chronic obstructive pulmonary disease)   . GERD (gastroesophageal reflux disease)   . Tobacco abuse   . Osteopenia   . Strain of chest wall 01/20/2011  . PMR (polymyalgia rheumatica)   . New onset of headaches     Family History  Problem Relation Age of Onset  . Heart disease Mother   . Cancer Father     lung/chest wall   . Diabetes Brother     siblings  . Hypertension Sister     x 2  . Heart disease Brother   . Breast cancer Paternal Grandmother   . Heart disease Sister   . Diabetes Sister   . Tuberculosis Sister   . Prostate cancer Father   . Cirrhosis Brother     History   Social History  . Marital Status: Married    Spouse Name: N/A    Number of Children: 1  . Years of Education: N/A   Occupational History  . [employer] as a Forensic psychologist     retired   Social History Main Topics  . Smoking status: Former Smoker -- 1.50 packs/day for 40 years    Types: Cigarettes    Quit date: 02/11/2009  . Smokeless tobacco: Never Used  . Alcohol Use: No  . Drug Use: No  . Sexual Activity: Not on file   Other Topics Concern  . Not on file   Social History Narrative   Lives in [place] with her husband.  Takes care of her two grandchildren, ages 33 and 2, everyday in [place].  Has one son who is 39.    Allergies  Allergen Reactions  . Prednisone     REACTION: hives, difficulty breathing, facial swelling.      Constitutional:  Positive headache, fatigue and fever. Denies abrupt weight changes.  HEENT:  Positive sore throat. Denies eye redness, eye pain, pressure behind the eyes, facial pain, nasal congestion, ear pain, ringing in the ears, wax buildup, runny nose or bloody nose. Respiratory: Positive cough and shortness of breath. Denies difficulty breathing Cardiovascular: Denies chest pain, chest tightness, palpitations or swelling in the hands or feet.   No other specific complaints in a complete review of systems (except as listed in HPI above).  Objective:   BP 126/72 mmHg  Pulse 99  Temp(Src) 99.2 F (37.3 C) (Oral)  Wt 182 lb (82.555 kg)  Wt Readings from Last 3 Encounters:  12/29/13 182 lb (82.555 kg)  12/17/13 179 lb 8 oz (81.421 kg)  11/09/13 182 lb (82.555 kg)     General: Appears her stated age, ill appearing in NAD. HEENT: Head: normal shape and size, no sinus tenderness noted; Eyes: sclera white, no icterus, conjunctiva pink; Ears: Tm's gray and intact, normal light reflex; Nose: mucosa pink and moist, septum midline; Throat/Mouth: Teeth present, mucosa erythematous and moist, no exudate noted, no lesions or ulcerations noted.  Neck: No  cervical lymphadenopathy. Cardiovascular: Normal rate and rhythm. S1,S2 noted.  No murmur, rubs or gallops noted.  Pulmonary/Chest: Normal effort and scattered rhonchi noted throughout. No respiratory distress. No wheezes, rales noted.      Assessment & Plan:   COPD exacerbation:  Rapid Flu: negative Get some rest and drink plenty of water Do salt water gargles/Ibuprofen for the sore throat eRx for Azithromax x 5 days Rx for Hycodan cough syrup  RTC as needed or if symptoms persist.

## 2013-12-29 NOTE — Progress Notes (Signed)
Pre visit review using our clinic review tool, if applicable. No additional management support is needed unless otherwise documented below in the visit note. 

## 2013-12-29 NOTE — Patient Instructions (Signed)
Cough, Adult  A cough is a reflex that helps clear your throat and airways. It can help heal the body or may be a reaction to an irritated airway. A cough may only last 2 or 3 weeks (acute) or may last more than 8 weeks (chronic).  CAUSES Acute cough:  Viral or bacterial infections. Chronic cough:  Infections.  Allergies.  Asthma.  Post-nasal drip.  Smoking.  Heartburn or acid reflux.  Some medicines.  Chronic lung problems (COPD).  Cancer. SYMPTOMS   Cough.  Fever.  Chest pain.  Increased breathing rate.  High-pitched whistling sound when breathing (wheezing).  Colored mucus that you cough up (sputum). TREATMENT   A bacterial cough may be treated with antibiotic medicine.  A viral cough must run its course and will not respond to antibiotics.  Your caregiver may recommend other treatments if you have a chronic cough. HOME CARE INSTRUCTIONS   Only take over-the-counter or prescription medicines for pain, discomfort, or fever as directed by your caregiver. Use cough suppressants only as directed by your caregiver.  Use a cold steam vaporizer or humidifier in your bedroom or home to help loosen secretions.  Sleep in a semi-upright position if your cough is worse at night.  Rest as needed.  Stop smoking if you smoke. SEEK IMMEDIATE MEDICAL CARE IF:   You have pus in your sputum.  Your cough starts to worsen.  You cannot control your cough with suppressants and are losing sleep.  You begin coughing up blood.  You have difficulty breathing.  You develop pain which is getting worse or is uncontrolled with medicine.  You have a fever. MAKE SURE YOU:   Understand these instructions.  Will watch your condition.  Will get help right away if you are not doing well or get worse. Document Released: 06/16/2010 Document Revised: 03/12/2011 Document Reviewed: 06/16/2010 [employer] Patient Information 2015 [employer]. This information is not intended  to replace advice given to you by your health care provider. Make sure you discuss any questions you have with your health care provider.  

## 2014-01-04 ENCOUNTER — Ambulatory Visit (INDEPENDENT_AMBULATORY_CARE_PROVIDER_SITE_OTHER)
Admission: RE | Admit: 2014-01-04 | Discharge: 2014-01-04 | Disposition: A | Discharge status: Home / Self Care | Payer: Medicare Other | Source: Ambulatory Visit | Attending: Internal Medicine | Admitting: Internal Medicine

## 2014-01-04 ENCOUNTER — Ambulatory Visit (INDEPENDENT_AMBULATORY_CARE_PROVIDER_SITE_OTHER): Payer: Medicare Other | Admitting: Internal Medicine

## 2014-01-04 ENCOUNTER — Encounter: Payer: Self-pay | Admitting: Internal Medicine

## 2014-01-04 VITALS — BP 118/72 | HR 93 | Temp 98.2°F | Wt 177.0 lb

## 2014-01-04 DIAGNOSIS — J441 Chronic obstructive pulmonary disease with (acute) exacerbation: Secondary | ICD-10-CM

## 2014-01-04 MED ORDER — DOXYCYCLINE HYCLATE 100 MG PO TABS: 100.0000 mg | ORAL_TABLET | Freq: Two times a day (BID) | ORAL | Status: DC

## 2014-01-04 NOTE — Progress Notes (Signed)
HPI  Pt presents to the clinic today to f/u visit from 12/29/13. She was diagnosed with a COPD exacerbation. She was given a zpack and Hycodan. She reports that she has not had any improvement in her symptoms after finishing the antibiotic. She continues to have cough. The cough is non productive now. She does have shortness of breath but this is her baseline, She denies chest pain. She is nauseated but denies vomiting or diarrhea. She has not tried anything else OTC. She reports that now her husband has similar symptoms as well.  Review of Systems      Past Medical History  Diagnosis Date  . Anxiety   . COPD (chronic obstructive pulmonary disease)   . GERD (gastroesophageal reflux disease)   . Tobacco abuse   . Osteopenia   . Strain of chest wall 01/20/2011  . PMR (polymyalgia rheumatica)   . New onset of headaches     Family History  Problem Relation Age of Onset  . Heart disease Mother   . Cancer Father     lung/chest wall   . Diabetes Brother     siblings  . Hypertension Sister     x 2  . Heart disease Brother   . Breast cancer Paternal Grandmother   . Heart disease Sister   . Diabetes Sister   . Tuberculosis Sister   . Prostate cancer Father   . Cirrhosis Brother     History   Social History  . Marital Status: Married    Spouse Name: N/A    Number of Children: 1  . Years of Education: N/A   Occupational History  . [employer] as a Forensic psychologist     retired   Social History Main Topics  . Smoking status: Former Smoker -- 1.50 packs/day for 40 years    Types: Cigarettes    Quit date: 02/11/2009  . Smokeless tobacco: Never Used  . Alcohol Use: No  . Drug Use: No  . Sexual Activity: Not on file   Other Topics Concern  . Not on file   Social History Narrative   Lives in [place] with her husband.  Takes care of her two grandchildren, ages 41 and 2, everyday in [place].  Has one son who is 25.    Allergies  Allergen Reactions  . Prednisone    REACTION: hives, difficulty breathing, facial swelling.      Constitutional: Positive fatigue. Denies headache, fever or abrupt weight changes.  HEENT:   Denies eye redness, eye pain, pressure behind the eyes, facial pain, nasal congestion, ear pain, ringing in the ears, wax buildup, runny nose or sore throat. Respiratory: Positive cough and shortness of breath. Denies difficulty breathing.  Cardiovascular: Denies chest pain, chest tightness, palpitations or swelling in the hands or feet.  GI: Positive nausea. Denies vomiting or diarrhea.  No other specific complaints in a complete review of systems (except as listed in HPI above).  Objective:   BP 118/72 mmHg  Pulse 93  Temp(Src) 98.2 F (36.8 C) (Oral)  Wt 177 lb (80.287 kg)  SpO2 94%  Wt Readings from Last 3 Encounters:  12/29/13 182 lb (82.555 kg)  12/17/13 179 lb 8 oz (81.421 kg)  11/09/13 182 lb (82.555 kg)     General: Appears her stated age, ill appearing in NAD. Skin: Warm dry and intact. HEENT: Head: normal shape and size; Eyes: sclera white, no icterus, conjunctiva pink; Ears: Tm's gray and intact, normal light reflex; Nose: mucosa pink and  moist, septum midline; Throat/Mouth: Teeth present, mucosa erythematous and moist, no exudate noted, no lesions or ulcerations noted.  Neck: No lymphadenopathy.  Cardiovascular: Normal rate and rhythm. S1,S2 noted.  No murmur, rubs or gallops noted.  Pulmonary/Chest: Normal effort and scattered rhonchi throghout. No respiratory distress. No wheezes, rales noted.      Assessment & Plan:   COPD exacerbation, not resolved:  Will get chest xray today to r/o pneumonia eRx for Doxycycline BID x 10 days Continue Hycodan at night Try Delsym during the day  RTC as needed or if symptoms persist.

## 2014-01-04 NOTE — Progress Notes (Signed)
Pre visit review using our clinic review tool, if applicable. No additional management support is needed unless otherwise documented below in the visit note. 

## 2014-01-04 NOTE — Patient Instructions (Signed)
Cough, Adult  A cough is a reflex that helps clear your throat and airways. It can help heal the body or may be a reaction to an irritated airway. A cough may only last 2 or 3 weeks (acute) or may last more than 8 weeks (chronic).  CAUSES Acute cough:  Viral or bacterial infections. Chronic cough:  Infections.  Allergies.  Asthma.  Post-nasal drip.  Smoking.  Heartburn or acid reflux.  Some medicines.  Chronic lung problems (COPD).  Cancer. SYMPTOMS   Cough.  Fever.  Chest pain.  Increased breathing rate.  High-pitched whistling sound when breathing (wheezing).  Colored mucus that you cough up (sputum). TREATMENT   A bacterial cough may be treated with antibiotic medicine.  A viral cough must run its course and will not respond to antibiotics.  Your caregiver may recommend other treatments if you have a chronic cough. HOME CARE INSTRUCTIONS   Only take over-the-counter or prescription medicines for pain, discomfort, or fever as directed by your caregiver. Use cough suppressants only as directed by your caregiver.  Use a cold steam vaporizer or humidifier in your bedroom or home to help loosen secretions.  Sleep in a semi-upright position if your cough is worse at night.  Rest as needed.  Stop smoking if you smoke. SEEK IMMEDIATE MEDICAL CARE IF:   You have pus in your sputum.  Your cough starts to worsen.  You cannot control your cough with suppressants and are losing sleep.  You begin coughing up blood.  You have difficulty breathing.  You develop pain which is getting worse or is uncontrolled with medicine.  You have a fever. MAKE SURE YOU:   Understand these instructions.  Will watch your condition.  Will get help right away if you are not doing well or get worse. Document Released: 06/16/2010 Document Revised: 03/12/2011 Document Reviewed: 06/16/2010 [employer] Patient Information 2015 [employer]. This information is not intended  to replace advice given to you by your health care provider. Make sure you discuss any questions you have with your health care provider.  

## 2014-02-14 ENCOUNTER — Other Ambulatory Visit: Payer: Self-pay | Admitting: Family Medicine

## 2014-03-30 ENCOUNTER — Telehealth: Payer: Self-pay | Admitting: *Deleted

## 2014-03-30 NOTE — Telephone Encounter (Signed)
LMOM to return my call about flu vaccine.

## 2014-04-26 ENCOUNTER — Ambulatory Visit (INDEPENDENT_AMBULATORY_CARE_PROVIDER_SITE_OTHER): Payer: Medicare Other | Admitting: Family Medicine

## 2014-04-26 ENCOUNTER — Encounter: Payer: Self-pay | Admitting: Family Medicine

## 2014-04-26 VITALS — BP 128/76 | HR 92 | Temp 98.1°F | Wt 180.8 lb

## 2014-04-26 DIAGNOSIS — J441 Chronic obstructive pulmonary disease with (acute) exacerbation: Secondary | ICD-10-CM

## 2014-04-26 MED ORDER — HYDROCODONE-HOMATROPINE 5-1.5 MG/5ML PO SYRP: 5.0000 mL | ORAL_SOLUTION | Freq: Three times a day (TID) | ORAL | Status: DC | PRN

## 2014-04-26 MED ORDER — DOXYCYCLINE HYCLATE 100 MG PO TABS: 100.0000 mg | ORAL_TABLET | Freq: Two times a day (BID) | ORAL | Status: DC

## 2014-04-26 NOTE — Progress Notes (Signed)
SUBJECTIVE:  Teresa Jensen is a 69 y.o. female who complains of coryza, congestion, sneezing, sore throat, productive cough, myalgias, headache and chills for 6 days. She denies a history of shortness of breath, sweats and vomiting and admits to a history of asthma. Patient denies smoke cigarettes.   Current Outpatient Prescriptions on File Prior to Visit  Medication Sig Dispense Refill  . aspirin 81 MG tablet Take 81 mg by mouth every other day.     . Calcium Citrate-Vitamin D (GNP CALCIUM CITRATE +D3 PO) Take 1 tablet by mouth daily.    Marland Kitchen levothyroxine (SYNTHROID, LEVOTHROID) 75 MCG tablet Take 1 tablet (75 mcg total) by mouth daily. 30 tablet 5   No current facility-administered medications on file prior to visit.    Allergies  Allergen Reactions  . Prednisone     REACTION: hives, difficulty breathing, facial swelling.     Past Medical History  Diagnosis Date  . Anxiety   . COPD (chronic obstructive pulmonary disease)   . GERD (gastroesophageal reflux disease)   . Tobacco abuse   . Osteopenia   . Strain of chest wall 01/20/2011  . PMR (polymyalgia rheumatica)   . New onset of headaches     Past Surgical History  Procedure Laterality Date  . Abdominal hysterectomy    . Lumbar fusion    . Appendectomy    . Cholecystectomy    . Hemorrhoid surgery    . Tubal ligation    . Cervical fusion      Family History  Problem Relation Age of Onset  . Heart disease Mother   . Cancer Father     lung/chest wall   . Diabetes Brother     siblings  . Hypertension Sister     x 2  . Heart disease Brother   . Breast cancer Paternal Grandmother   . Heart disease Sister   . Diabetes Sister   . Tuberculosis Sister   . Prostate cancer Father   . Cirrhosis Brother     History   Social History  . Marital Status: Married    Spouse Name: N/A  . Number of Children: 1  . Years of Education: N/A   Occupational History  . [employer] as a Forensic psychologist     retired    Social History Main Topics  . Smoking status: Former Smoker -- 1.50 packs/day for 40 years    Types: Cigarettes    Quit date: 02/11/2009  . Smokeless tobacco: Never Used  . Alcohol Use: No  . Drug Use: No  . Sexual Activity: Not on file   Other Topics Concern  . Not on file   Social History Narrative   Lives in [place] with her husband.  Takes care of her two grandchildren, ages 37 and 2, everyday in [place].  Has one son who is 95.   The PMH, PSH, Social History, Family History, Medications, and allergies have been reviewed in [facility], and have been updated if relevant.  OBJECTIVE: BP 128/76 mmHg  Pulse 92  Temp(Src) 98.1 F (36.7 C) (Oral)  Wt 180 lb 12 oz (81.988 kg)  SpO2 98%  She appears well, vital signs are as noted. Ears normal.  Throat and pharynx normal.  Neck supple. No adenopathy in the neck. Nose is congested. Sinuses non tender. RLL wheezes, scattered ronchi  ASSESSMENT:  bronchitis  PLAN: Doxycycline 100 mg twice daily x 10 days, hycodan prn cough -aware of sedation precautions. Symptomatic therapy suggested: push fluids, rest  and return office visit prn if symptoms persist or worsen.Call or return to clinic prn if these symptoms worsen or fail to improve as anticipated.

## 2014-04-26 NOTE — Progress Notes (Signed)
Pre visit review using our clinic review tool, if applicable. No additional management support is needed unless otherwise documented below in the visit note. 

## 2014-04-26 NOTE — Patient Instructions (Signed)

## 2014-05-10 ENCOUNTER — Other Ambulatory Visit: Payer: Self-pay | Admitting: Orthopedic Surgery

## 2014-05-21 ENCOUNTER — Encounter (HOSPITAL_BASED_OUTPATIENT_CLINIC_OR_DEPARTMENT_OTHER): Admission: RE | Payer: Self-pay | Source: Ambulatory Visit

## 2014-05-21 ENCOUNTER — Ambulatory Visit (HOSPITAL_BASED_OUTPATIENT_CLINIC_OR_DEPARTMENT_OTHER)
Admission: RE | Admit: 2014-05-21 | Payer: No Typology Code available for payment source | Source: Ambulatory Visit | Admitting: Orthopedic Surgery

## 2014-05-21 SURGERY — RELEASE, FIRST DORSAL COMPARTMENT, HAND
Anesthesia: General | Site: Wrist | Laterality: Right

## 2014-05-28 ENCOUNTER — Other Ambulatory Visit: Payer: Self-pay | Admitting: Family Medicine

## 2014-05-28 ENCOUNTER — Other Ambulatory Visit: Payer: Self-pay

## 2014-05-28 DIAGNOSIS — Z1231 Encounter for screening mammogram for malignant neoplasm of breast: Secondary | ICD-10-CM

## 2014-06-28 ENCOUNTER — Encounter: Payer: Self-pay | Admitting: Internal Medicine

## 2014-06-28 ENCOUNTER — Ambulatory Visit (INDEPENDENT_AMBULATORY_CARE_PROVIDER_SITE_OTHER): Payer: Medicare Other | Admitting: Internal Medicine

## 2014-06-28 VITALS — BP 136/72 | HR 83 | Temp 98.6°F | Wt 185.5 lb

## 2014-06-28 DIAGNOSIS — T781XXA Other adverse food reactions, not elsewhere classified, initial encounter: Secondary | ICD-10-CM

## 2014-06-28 NOTE — Progress Notes (Signed)
Subjective:    Patient ID: Teresa Jensen, female    DOB: [birth year], 69 y.o.   MRN: [id number]  HPI  Pt presents to the clinic today with c/o a rash on her face. This started yesterday shortly after eating a mango. She reports she has had similar reaction to eating mango in the past. The rash is very itchy. She has noticed some swelling of her face as well. She denies swelling of the lips or difficulty breathing. She has tried Hydrocortisone cream with some relief.    Review of Systems  Past Medical History  Diagnosis Date  . Anxiety   . COPD (chronic obstructive pulmonary disease)   . GERD (gastroesophageal reflux disease)   . Tobacco abuse   . Osteopenia   . Strain of chest wall 01/20/2011  . PMR (polymyalgia rheumatica)   . New onset of headaches     Current Outpatient Prescriptions  Medication Sig Dispense Refill  . aspirin 81 MG tablet Take 81 mg by mouth every other day.     . Calcium Citrate-Vitamin D (GNP CALCIUM CITRATE +D3 PO) Take 1 tablet by mouth daily.    Marland Kitchen levothyroxine (SYNTHROID, LEVOTHROID) 75 MCG tablet Take 1 tablet (75 mcg total) by mouth daily. 30 tablet 5   No current facility-administered medications for this visit.    Allergies  Allergen Reactions  . Food     MANGO  . Prednisone     REACTION: hives, difficulty breathing, facial swelling.     Family History  Problem Relation Age of Onset  . Heart disease Mother   . Cancer Father     lung/chest wall   . Diabetes Brother     siblings  . Hypertension Sister     x 2  . Heart disease Brother   . Breast cancer Paternal Grandmother   . Heart disease Sister   . Diabetes Sister   . Tuberculosis Sister   . Prostate cancer Father   . Cirrhosis Brother     History   Social History  . Marital Status: Married    Spouse Name: N/A  . Number of Children: 1  . Years of Education: N/A   Occupational History  . [employer] as a Forensic psychologist     retired   Social History Main Topics  .  Smoking status: Former Smoker -- 1.50 packs/day for 40 years    Types: Cigarettes    Quit date: 02/11/2009  . Smokeless tobacco: Never Used  . Alcohol Use: No  . Drug Use: No  . Sexual Activity: Not on file   Other Topics Concern  . Not on file   Social History Narrative   Lives in [place] with her husband.  Takes care of her two grandchildren, ages 53 and 2, everyday in [place].  Has one son who is 68.     Constitutional: Denies fever, malaise, fatigue, headache or abrupt weight changes.  Respiratory: Denies difficulty breathing, shortness of breath, cough or sputum production.   Cardiovascular: Denies chest pain, chest tightness, palpitations or swelling in the hands or feet.  Skin: Pt reports rash and swelling of face. Denies lesions or ulcercations.  Neurological: Denies dizziness, difficulty with memory, difficulty with speech or problems with balance and coordination.   No other specific complaints in a complete review of systems (except as listed in HPI above).     Objective:   Physical Exam    BP 136/72 mmHg  Pulse 83  Temp(Src) 98.6 F (37  C) (Oral)  Wt 185 lb 8 oz (84.142 kg)  SpO2 94% Wt Readings from Last 3 Encounters:  06/28/14 185 lb 8 oz (84.142 kg)  04/26/14 180 lb 12 oz (81.988 kg)  01/04/14 177 lb (80.287 kg)    General: Appears her stated age, well developed, well nourished in NAD. Skin: Warm, dry and intact. Maculopapular rash noted all over face. Face is red and mildly swollen. HEENT: Head: normal shape and size; No angioedema noted. Cardiovascular: Normal rate and rhythm. S1,S2 noted.  Pulmonary/Chest: Normal effort and positive vesicular breath sounds. No respiratory distress. No wheezes, rales or ronchi noted.  Neurological: Alert and oriented.   BMET    Component Value Date/Time   NA 138 12/30/2012 0931   K 4.2 12/30/2012 0931   CL 104 12/30/2012 0931   CO2 27 12/30/2012 0931   GLUCOSE 131* 12/30/2012 0931   BUN 13 12/30/2012 0931     CREATININE 0.9 12/30/2012 0931   CALCIUM 9.1 12/30/2012 0931   GFRNONAA >60 03/28/2008 2301   GFRAA  03/28/2008 2301    >60        The eGFR has been calculated using the MDRD equation. This calculation has not been validated in all clinical situations. eGFR's persistently <60 mL/min signify possible Chronic Kidney Disease.    Lipid Panel     Component Value Date/Time   CHOL 147 04/24/2010 0911   TRIG 87.0 04/24/2010 0911   HDL 42.60 04/24/2010 0911   CHOLHDL 3 04/24/2010 0911   VLDL 17.4 04/24/2010 0911   LDLCALC 87 04/24/2010 0911    CBC    Component Value Date/Time   WBC 10.9* 06/17/2012 0923   RBC 4.59 06/17/2012 0923   HGB 14.0 06/17/2012 0923   HCT 41.7 06/17/2012 0923   PLT 303.0 06/17/2012 0923   MCV 90.8 06/17/2012 0923   MCHC 33.6 06/17/2012 0923   RDW 14.5 06/17/2012 0923   LYMPHSABS 3.0 06/17/2012 0923   MONOABS 1.0 06/17/2012 0923   EOSABS 0.1 06/17/2012 0923   BASOSABS 0.1 06/17/2012 0923    Hgb A1C Lab Results  Component Value Date   HGBA1C 6.0 12/30/2012       Assessment & Plan:   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

## 2014-06-28 NOTE — Patient Instructions (Addendum)
Avoid Mango Continue the Hydrocortisone cream twice daily Take Benadryl twice a day to help with the itching Allergies Allergies may happen from anything your body is sensitive to. This may be food, medicines, pollens, chemicals, and nearly anything around you in everyday life that produces allergens. An allergen is anything that causes an allergy producing substance. Heredity is often a factor in causing these problems. This means you may have some of the same allergies as your parents. Food allergies happen in all age groups. Food allergies are some of the most severe and life threatening. Some common food allergies are cow's milk, seafood, eggs, nuts, wheat, and soybeans. SYMPTOMS   Swelling around the mouth.  An itchy red rash or hives.  Vomiting or diarrhea.  Difficulty breathing. SEVERE ALLERGIC REACTIONS ARE LIFE-THREATENING. This reaction is called anaphylaxis. It can cause the mouth and throat to swell and cause difficulty with breathing and swallowing. In severe reactions only a trace amount of food (for example, peanut oil in a salad) may cause death within seconds. Seasonal allergies occur in all age groups. These are seasonal because they usually occur during the same season every year. They may be a reaction to molds, grass pollens, or tree pollens. Other causes of problems are house dust mite allergens, pet dander, and mold spores. The symptoms often consist of nasal congestion, a runny itchy nose associated with sneezing, and tearing itchy eyes. There is often an associated itching of the mouth and ears. The problems happen when you come in contact with pollens and other allergens. Allergens are the particles in the air that the body reacts to with an allergic reaction. This causes you to release allergic antibodies. Through a chain of events, these eventually cause you to release histamine into the blood stream. Although it is meant to be protective to the body, it is this release  that causes your discomfort. This is why you were given anti-histamines to feel better. If you are unable to pinpoint the offending allergen, it may be determined by skin or blood testing. Allergies cannot be cured but can be controlled with medicine. Hay fever is a collection of all or some of the seasonal allergy problems. It may often be treated with simple over-the-counter medicine such as diphenhydramine. Take medicine as directed. Do not drink alcohol or drive while taking this medicine. Check with your caregiver or package insert for child dosages. If these medicines are not effective, there are many new medicines your caregiver can prescribe. Stronger medicine such as nasal spray, eye drops, and corticosteroids may be used if the first things you try do not work well. Other treatments such as immunotherapy or desensitizing injections can be used if all else fails. Follow up with your caregiver if problems continue. These seasonal allergies are usually not life threatening. They are generally more of a nuisance that can often be handled using medicine. HOME CARE INSTRUCTIONS   If unsure what causes a reaction, keep a diary of foods eaten and symptoms that follow. Avoid foods that cause reactions.  If hives or rash are present:  Take medicine as directed.  You may use an over-the-counter antihistamine (diphenhydramine) for hives and itching as needed.  Apply cold compresses (cloths) to the skin or take baths in cool water. Avoid hot baths or showers. Heat will make a rash and itching worse.  If you are severely allergic:  Following a treatment for a severe reaction, hospitalization is often required for closer follow-up.  Wear a medic-alert bracelet  or necklace stating the allergy.  You and your family must learn how to give adrenaline or use an anaphylaxis kit.  If you have had a severe reaction, always carry your anaphylaxis kit or EpiPen with you. Use this medicine as directed by  your caregiver if a severe reaction is occurring. Failure to do so could have a fatal outcome. SEEK MEDICAL CARE IF:  You suspect a food allergy. Symptoms generally happen within 30 minutes of eating a food.  Your symptoms have not gone away within 2 days or are getting worse.  You develop new symptoms.  You want to retest yourself or your child with a food or drink you think causes an allergic reaction. Never do this if an anaphylactic reaction to that food or drink has happened before. Only do this under the care of a caregiver. SEEK IMMEDIATE MEDICAL CARE IF:   You have difficulty breathing, are wheezing, or have a tight feeling in your chest or throat.  You have a swollen mouth, or you have hives, swelling, or itching all over your body.  You have had a severe reaction that has responded to your anaphylaxis kit or an EpiPen. These reactions may return when the medicine has worn off. These reactions should be considered life threatening. MAKE SURE YOU:   Understand these instructions.  Will watch your condition.  Will get help right away if you are not doing well or get worse. Document Released: 03/13/2002 Document Revised: 04/14/2012 Document Reviewed: 08/18/2007 [facility] Patient Information 2015 [place], [place]. This information is not intended to replace advice given to you by your health care provider. Make sure you discuss any questions you have with your health care provider.

## 2014-06-28 NOTE — Progress Notes (Signed)
Pre visit review using our clinic review tool, if applicable. No additional management support is needed unless otherwise documented below in the visit note. 

## 2014-07-12 ENCOUNTER — Ambulatory Visit
Admission: RE | Admit: 2014-07-12 | Discharge: 2014-07-12 | Disposition: A | Discharge status: Home / Self Care | Payer: Medicare Other | Source: Ambulatory Visit

## 2014-07-12 DIAGNOSIS — Z1231 Encounter for screening mammogram for malignant neoplasm of breast: Secondary | ICD-10-CM

## 2014-07-29 ENCOUNTER — Ambulatory Visit (INDEPENDENT_AMBULATORY_CARE_PROVIDER_SITE_OTHER): Payer: Medicare Other | Admitting: Family Medicine

## 2014-07-29 ENCOUNTER — Ambulatory Visit (INDEPENDENT_AMBULATORY_CARE_PROVIDER_SITE_OTHER)
Admission: RE | Admit: 2014-07-29 | Discharge: 2014-07-29 | Disposition: A | Discharge status: Home / Self Care | Payer: Medicare Other | Source: Ambulatory Visit | Attending: Family Medicine | Admitting: Family Medicine

## 2014-07-29 ENCOUNTER — Encounter: Payer: Self-pay | Admitting: Family Medicine

## 2014-07-29 VITALS — BP 126/66 | HR 83 | Temp 97.5°F | Wt 184.5 lb

## 2014-07-29 DIAGNOSIS — M7989 Other specified soft tissue disorders: Secondary | ICD-10-CM

## 2014-07-29 NOTE — Assessment & Plan Note (Signed)
New- unclear etiology. Does not seem consistent with DVT.  Very diffuse area with TTP over bone. ? Mass.  Will get tib/fib xray first.  Proceed with doppler if neg. The patient indicates understanding of these issues and agrees with the plan.

## 2014-07-29 NOTE — Progress Notes (Signed)
Pre visit review using our clinic review tool, if applicable. No additional management support is needed unless otherwise documented below in the visit note. 

## 2014-07-29 NOTE — Progress Notes (Signed)
Subjective:   Patient ID: Teresa Jensen, female    DOB: 1945-09-02, 69 y.o.   MRN: [id number]  Teresa Jensen is a pleasant 69 y.o. year old female who presents to clinic today with Leg Swelling  on 07/29/2014  HPI:   Noticed left shin was larger than right shin two days ago.  Does not seem tender or warm. No known injury.  Has had areas "pop up" like this on other parts of her body.  No CP or SOB.  Current Outpatient Prescriptions on File Prior to Visit  Medication Sig Dispense Refill  . aspirin 81 MG tablet Take 81 mg by mouth every other day.     . Calcium Citrate-Vitamin D (GNP CALCIUM CITRATE +D3 PO) Take 1 tablet by mouth daily.    Marland Kitchen levothyroxine (SYNTHROID, LEVOTHROID) 75 MCG tablet Take 1 tablet (75 mcg total) by mouth daily. 30 tablet 5   No current facility-administered medications on file prior to visit.    Allergies  Allergen Reactions  . Food     MANGO  . Prednisone     REACTION: hives, difficulty breathing, facial swelling.     Past Medical History  Diagnosis Date  . Anxiety   . COPD (chronic obstructive pulmonary disease)   . GERD (gastroesophageal reflux disease)   . Tobacco abuse   . Osteopenia   . Strain of chest wall 01/20/2011  . PMR (polymyalgia rheumatica)   . New onset of headaches     Past Surgical History  Procedure Laterality Date  . Abdominal hysterectomy    . Lumbar fusion    . Appendectomy    . Cholecystectomy    . Hemorrhoid surgery    . Tubal ligation    . Cervical fusion      Family History  Problem Relation Age of Onset  . Heart disease Mother   . Cancer Father     lung/chest wall   . Diabetes Brother     siblings  . Hypertension Sister     x 2  . Heart disease Brother   . Breast cancer Paternal Grandmother   . Heart disease Sister   . Diabetes Sister   . Tuberculosis Sister   . Prostate cancer Father   . Cirrhosis Brother     History   Social History  . Marital Status: Married    Spouse Name: N/A  .  Number of Children: 1  . Years of Education: N/A   Occupational History  . [employer] as a Forensic psychologist     retired   Social History Main Topics  . Smoking status: Former Smoker -- 1.50 packs/day for 40 years    Types: Cigarettes    Quit date: 02/11/2009  . Smokeless tobacco: Never Used  . Alcohol Use: No  . Drug Use: No  . Sexual Activity: Not on file   Other Topics Concern  . Not on file   Social History Narrative   Lives in [place] with her husband.  Takes care of her two grandchildren, ages 43 and 2, everyday in [place].  Has one son who is 12.   The PMH, PSH, Social History, Family History, Medications, and allergies have been reviewed in [facility], and have been updated if relevant.   Review of Systems  Constitutional: Negative.   Genitourinary: Negative.   Musculoskeletal: Negative.   Skin: Negative.   Neurological: Negative.   Hematological: Negative.   Psychiatric/Behavioral: Negative.   All other systems reviewed and are negative.  Objective:    BP 126/66 mmHg  Pulse 83  Temp(Src) 97.5 F (36.4 C) (Oral)  Wt 184 lb 8 oz (83.689 kg)  SpO2 91%   Physical Exam  Constitutional: She is oriented to person, place, and time. She appears well-developed and well-nourished. No distress.  HENT:  Head: Normocephalic.  Eyes: Conjunctivae are normal.  Cardiovascular: Normal rate.   Pulmonary/Chest: Effort normal.  Musculoskeletal:       Legs: Neurological: She is alert and oriented to person, place, and time. No cranial nerve deficit.  Skin: Skin is warm and dry.  Psychiatric: She has a normal mood and affect. Her behavior is normal. Judgment and thought content normal.  Nursing note and vitals reviewed.         Assessment & Plan:   Left leg swelling - Plan: DG Tibia/Fibula Left No Follow-up on file.

## 2014-07-29 NOTE — Patient Instructions (Signed)
Good to see you. I will call you with your xray results.   

## 2014-08-12 ENCOUNTER — Other Ambulatory Visit: Payer: Self-pay | Admitting: Family Medicine

## 2014-09-27 ENCOUNTER — Other Ambulatory Visit: Payer: Self-pay | Admitting: Internal Medicine

## 2014-09-27 DIAGNOSIS — Z13220 Encounter for screening for lipoid disorders: Secondary | ICD-10-CM

## 2014-09-27 DIAGNOSIS — E039 Hypothyroidism, unspecified: Secondary | ICD-10-CM

## 2014-09-27 DIAGNOSIS — Z13820 Encounter for screening for osteoporosis: Secondary | ICD-10-CM

## 2014-10-05 ENCOUNTER — Other Ambulatory Visit (INDEPENDENT_AMBULATORY_CARE_PROVIDER_SITE_OTHER): Payer: Medicare Other

## 2014-10-05 DIAGNOSIS — Z13220 Encounter for screening for lipoid disorders: Secondary | ICD-10-CM

## 2014-10-05 DIAGNOSIS — E039 Hypothyroidism, unspecified: Secondary | ICD-10-CM

## 2014-10-05 DIAGNOSIS — Z13820 Encounter for screening for osteoporosis: Secondary | ICD-10-CM | POA: Diagnosis not present

## 2014-10-05 LAB — LIPID PANEL
Cholesterol: 132 mg/dL (ref 0–200)
HDL: 43.2 mg/dL (ref >39.00)
LDL Cholesterol: 70 mg/dL (ref 0–99)
NonHDL: 88.53
Total CHOL/HDL Ratio: 3
Triglycerides: 94 mg/dL (ref 0.0–149.0)
VLDL: 18.8 mg/dL (ref 0.0–40.0)

## 2014-10-05 LAB — COMPREHENSIVE METABOLIC PANEL
ALT: 24 U/L (ref 0–35)
AST: 26 U/L (ref 0–37)
Albumin: 4 g/dL (ref 3.5–5.2)
Alkaline Phosphatase: 79 U/L (ref 39–117)
BUN: 10 mg/dL (ref 6–23)
CO2: 26 mEq/L (ref 19–32)
Calcium: 9.6 mg/dL (ref 8.4–10.5)
Chloride: 101 mEq/L (ref 96–112)
Creatinine, Ser: 0.67 mg/dL (ref 0.40–1.20)
GFR: 92.61 mL/min (ref >60.00)
Glucose, Bld: 97 mg/dL (ref 70–99)
Potassium: 4.3 mEq/L (ref 3.5–5.1)
Sodium: 137 mEq/L (ref 135–145)
Total Bilirubin: 0.5 mg/dL (ref 0.2–1.2)
Total Protein: 7.9 g/dL (ref 6.0–8.3)

## 2014-10-05 LAB — CBC
HCT: 45.5 % (ref 36.0–46.0)
Hemoglobin: 14.9 g/dL (ref 12.0–15.0)
MCHC: 32.7 g/dL (ref 30.0–36.0)
MCV: 89.4 fl (ref 78.0–100.0)
Platelets: 338 10*3/uL (ref 150.0–400.0)
RBC: 5.09 Mil/uL (ref 3.87–5.11)
RDW: 14.7 % (ref 11.5–15.5)
WBC: 10.4 10*3/uL (ref 4.0–10.5)

## 2014-10-05 LAB — VITAMIN D 25 HYDROXY (VIT D DEFICIENCY, FRACTURES): VITD: 15.29 ng/mL — ABNORMAL LOW (ref 30.00–100.00)

## 2014-10-05 LAB — TSH: TSH: 1.4 u[IU]/mL (ref 0.35–4.50)

## 2014-10-05 LAB — T4, FREE: Free T4: 0.94 ng/dL (ref 0.60–1.60)

## 2014-10-12 ENCOUNTER — Encounter: Payer: Self-pay | Admitting: Family Medicine

## 2014-10-12 ENCOUNTER — Ambulatory Visit (INDEPENDENT_AMBULATORY_CARE_PROVIDER_SITE_OTHER): Payer: Medicare Other | Admitting: Family Medicine

## 2014-10-12 VITALS — BP 128/78 | HR 74 | Temp 98.3°F | Ht 59.0 in | Wt 183.0 lb

## 2014-10-12 DIAGNOSIS — M899 Disorder of bone, unspecified: Secondary | ICD-10-CM

## 2014-10-12 DIAGNOSIS — E559 Vitamin D deficiency, unspecified: Secondary | ICD-10-CM | POA: Diagnosis not present

## 2014-10-12 DIAGNOSIS — M5481 Occipital neuralgia: Secondary | ICD-10-CM | POA: Insufficient documentation

## 2014-10-12 DIAGNOSIS — Z23 Encounter for immunization: Secondary | ICD-10-CM

## 2014-10-12 DIAGNOSIS — F411 Generalized anxiety disorder: Secondary | ICD-10-CM

## 2014-10-12 DIAGNOSIS — Z0000 Encounter for general adult medical examination without abnormal findings: Secondary | ICD-10-CM | POA: Diagnosis not present

## 2014-10-12 DIAGNOSIS — E038 Other specified hypothyroidism: Secondary | ICD-10-CM | POA: Diagnosis not present

## 2014-10-12 DIAGNOSIS — M949 Disorder of cartilage, unspecified: Secondary | ICD-10-CM

## 2014-10-12 DIAGNOSIS — K219 Gastro-esophageal reflux disease without esophagitis: Secondary | ICD-10-CM | POA: Diagnosis not present

## 2014-10-12 MED ORDER — ERGOCALCIFEROL 1.25 MG (50000 UT) PO CAPS: 50000.0000 [IU] | ORAL_CAPSULE | ORAL | Status: DC

## 2014-10-12 MED ORDER — LEVOTHYROXINE SODIUM 75 MCG PO TABS: ORAL_TABLET | ORAL | Status: DC

## 2014-10-12 NOTE — Addendum Note (Signed)
Addended by: Lucille Passy on: 10/12/2014 08:55 AM   Modules accepted: Orders, SmartSet

## 2014-10-12 NOTE — Assessment & Plan Note (Signed)
Euthyroid. NO changes made today.

## 2014-10-12 NOTE — Addendum Note (Signed)
Addended by: Emelia Salisbury C on: 10/12/2014 08:56 AM   Modules accepted: Orders

## 2014-10-12 NOTE — Assessment & Plan Note (Addendum)
The patients weight, height, BMI and visual acuity have been recorded in the chart.  Cognitive function assessed.   I have made referrals, counseling and provided education to the patient based review of the above and I have provided the pt with a written personalized care plan for preventive services.  Prevnar 13 and influenza vaccines given today.

## 2014-10-12 NOTE — Progress Notes (Signed)
Pre visit review using our clinic review tool, if applicable. No additional management support is needed unless otherwise documented below in the visit note. 

## 2014-10-12 NOTE — Patient Instructions (Signed)
Great to see you. Your labs look fantastic. Please take Vitamin D as directed- 1 capsule weekly for 7 weeks, then take OTC 1600-1800 IU daily.  We will repeat your Vit D level in a few months.

## 2014-10-12 NOTE — Addendum Note (Signed)
Addended by: Emelia Salisbury C on: 10/12/2014 08:59 AM   Modules accepted: Orders

## 2014-10-12 NOTE — Assessment & Plan Note (Signed)
New- Vit D3 50,000 units x 7 weeks - , then continue VitD 1600 - 1800 IU daily.  Recheck in 8-12 weeks .

## 2014-10-12 NOTE — Progress Notes (Signed)
Subjective:   Patient ID: Teresa Jensen, female    DOB: June 25, 1945, 69 y.o.   MRN: [id number]  Ahriana Gunkel Mummert is a pleasant 69 y.o. year old female who presents to clinic today with Annual Exam and follow up of chronic medical conditions on 10/12/2014  HPI:  I have personally reviewed the Medicare Annual Wellness questionnaire and have noted 1. The patient's medical and social history 2. Their use of alcohol, tobacco or illicit drugs 3. Their current medications and supplements 4. The patient's functional ability including ADL's, fall risks, home safety risks and hearing or visual             impairment. 5. Diet and physical activities 6. Evidence for depression or mood disorders  End of life wishes discussed and updated in Social History.  The roster of all physicians providing medical care to patient - is listed in the CareTeams section of the chart.  Colonoscopy 06/29/11 Tdap 10/01/08 Mammogram 07/12/14 Remote h/o hysterectomy  VIt D def- Vit D 15 this month. She has noticed she is a little more fatigued but does "chase after two grand youngings."  Hypothyroidism- on synthroid 75 mcg daily. Denies any symptoms of hypo or hyperthyroidism. Lab Results  Component Value Date   TSH 1.40 10/05/2014    Lab Results  Component Value Date   WBC 10.4 10/05/2014   HGB 14.9 10/05/2014   HCT 45.5 10/05/2014   MCV 89.4 10/05/2014   PLT 338.0 10/05/2014   Lab Results  Component Value Date   NA 137 10/05/2014   K 4.3 10/05/2014   CL 101 10/05/2014   CO2 26 10/05/2014   Lab Results  Component Value Date   ALT 24 10/05/2014   AST 26 10/05/2014   ALKPHOS 79 10/05/2014   BILITOT 0.5 10/05/2014   Lab Results  Component Value Date   TSH 1.40 10/05/2014   Lab Results  Component Value Date   CHOL 132 10/05/2014   HDL 43.20 10/05/2014   LDLCALC 70 10/05/2014   TRIG 94.0 10/05/2014   CHOLHDL 3 10/05/2014   Current Outpatient Prescriptions on File Prior to Visit    Medication Sig Dispense Refill  . aspirin 81 MG tablet Take 81 mg by mouth every other day.     . Calcium Citrate-Vitamin D (GNP CALCIUM CITRATE +D3 PO) Take 1 tablet by mouth daily.    Marland Kitchen levothyroxine (SYNTHROID, LEVOTHROID) 75 MCG tablet TAKE 1 TABLET (75 MCG TOTAL) BY MOUTH DAILY. 30 tablet 0   No current facility-administered medications on file prior to visit.    Allergies  Allergen Reactions  . Food     MANGO  . Prednisone     REACTION: hives, difficulty breathing, facial swelling.     Past Medical History  Diagnosis Date  . Anxiety   . COPD (chronic obstructive pulmonary disease) ([place])   . GERD (gastroesophageal reflux disease)   . Tobacco abuse   . Osteopenia   . Strain of chest wall 01/20/2011  . PMR (polymyalgia rheumatica) ([facility])   . New onset of headaches     Past Surgical History  Procedure Laterality Date  . Abdominal hysterectomy    . Lumbar fusion    . Appendectomy    . Cholecystectomy    . Hemorrhoid surgery    . Tubal ligation    . Cervical fusion      Family History  Problem Relation Age of Onset  . Heart disease Mother   . Cancer Father  lung/chest wall   . Diabetes Brother     siblings  . Hypertension Sister     x 2  . Heart disease Brother   . Breast cancer Paternal Grandmother   . Heart disease Sister   . Diabetes Sister   . Tuberculosis Sister   . Prostate cancer Father   . Cirrhosis Brother     Social History   Social History  . Marital Status: Married    Spouse Name: N/A  . Number of Children: 1  . Years of Education: N/A   Occupational History  . [employer] as a Forensic psychologist     retired   Social History Main Topics  . Smoking status: Former Smoker -- 1.50 packs/day for 40 years    Types: Cigarettes    Quit date: 02/11/2009  . Smokeless tobacco: Never Used  . Alcohol Use: No  . Drug Use: No  . Sexual Activity: Not on file   Other Topics Concern  . Not on file   Social History Narrative   Lives in  [place] with her husband.  Takes care of her two grandchildren, ages 83 and 2, everyday in [place].  Has one son who is 58.   Desires CPR   Would not want prolonged life support if futile   The PMH, PSH, Social History, Family History, Medications, and allergies have been reviewed in [facility], and have been updated if relevant.   Review of Systems  Constitutional: Positive for fatigue. Negative for unexpected weight change.  HENT: Negative.   Eyes: Negative.   Respiratory: Negative.   Cardiovascular: Negative.   Gastrointestinal: Negative.   Endocrine: Negative.   Genitourinary: Negative.   Musculoskeletal: Negative.   Skin: Negative.   Allergic/Immunologic: Negative.   Neurological: Negative.   Hematological: Negative.   Psychiatric/Behavioral: Negative.   All other systems reviewed and are negative.      Objective:    BP 128/78 mmHg  Pulse 74  Temp(Src) 98.3 F (36.8 C) (Oral)  Ht '4\' 11"'$  (1.499 m)  Wt 183 lb (83.008 kg)  BMI 36.94 kg/m2   Physical Exam  Constitutional: She is oriented to person, place, and time. She appears well-developed and well-nourished. No distress.  HENT:  Head: Normocephalic.  Eyes: Pupils are equal, round, and reactive to light.  Cardiovascular: Normal rate and regular rhythm.   Pulmonary/Chest: Effort normal and breath sounds normal. No respiratory distress. She has no wheezes.  Abdominal: Soft. Bowel sounds are normal. She exhibits no distension. There is no tenderness.  Musculoskeletal: Normal range of motion. She exhibits no edema.  Neurological: She is alert and oriented to person, place, and time. No cranial nerve deficit.  Skin: Skin is warm and dry.  Psychiatric: She has a normal mood and affect. Her behavior is normal. Judgment and thought content normal.  Nursing note and vitals reviewed.         Assessment & Plan:   Medicare annual wellness visit, subsequent  Disorder of bone and cartilage  Gastroesophageal reflux  disease, esophagitis presence not specified  Anxiety state  Other specified hypothyroidism  Occipital neuralgia, unspecified laterality No Follow-up on file.

## 2014-10-18 ENCOUNTER — Telehealth: Payer: Self-pay | Admitting: Family Medicine

## 2014-10-18 NOTE — Telephone Encounter (Signed)
Patient Name: Teresa Jensen  DOB: 01/16/1945    Initial Comment Caller states she got pneumonia shot Tuesday. Arm is red and warm   Nurse Assessment  Nurse: Verlin Fester, RN, Stanton Kidney Date/Time Eilene Ghazi Time): 10/18/2014 8:50:14 AM  Confirm and document reason for call. If symptomatic, describe symptoms. ---Patient states she got the pneumonia shot on Tuesday in her right arm and it is red and feels hot to the touch.  Has the patient traveled out of the country within the last 30 days? ---No  Does the patient have any new or worsening symptoms? ---Yes  Will a triage be completed? ---Yes  Related visit to physician within the last 2 weeks? ---Yes  Does the PT have any chronic conditions? (i.e. diabetes, asthma, etc.) ---No     Guidelines    Guideline Title Affirmed Question Affirmed Notes  Immunization Reactions [1] Pain, tenderness, or swelling at the injection site AND [2] persists > 3 days    Final Disposition User   See PCP When Office is Open (within 3 days) Verlin Fester, RN, Stanton Kidney    Disagree/Comply: Comply

## 2014-10-18 NOTE — Telephone Encounter (Signed)
Pt has appt 10/19/14 at 9 AM with Dr Deborra Medina.

## 2014-10-19 ENCOUNTER — Encounter: Payer: Self-pay | Admitting: Family Medicine

## 2014-10-19 ENCOUNTER — Ambulatory Visit (INDEPENDENT_AMBULATORY_CARE_PROVIDER_SITE_OTHER): Payer: Medicare Other | Admitting: Family Medicine

## 2014-10-19 VITALS — BP 146/78 | HR 101 | Temp 98.2°F | Wt 178.0 lb

## 2014-10-19 DIAGNOSIS — T881XXA Other complications following immunization, not elsewhere classified, initial encounter: Secondary | ICD-10-CM

## 2014-10-19 NOTE — Progress Notes (Signed)
Subjective:   Patient ID: Teresa Jensen, female    DOB: [birth year], 69 y.o.   MRN: [id number]  Teresa Jensen is a pleasant 69 y.o. year old female who presents to clinic today with Injection site swelling  on 10/19/2014  HPI:  Received prevnar 13 on 10/11- immediate redness, warmth, itchiness over injection site- now "the size of a baseball."  No wheezing or SOB.  Rash is not spreading.  Has taken Tylenol and applied heat which have not helped much.  Current Outpatient Prescriptions on File Prior to Visit  Medication Sig Dispense Refill  . aspirin 81 MG tablet Take 81 mg by mouth every other day.     . Calcium Citrate-Vitamin D (GNP CALCIUM CITRATE +D3 PO) Take 1 tablet by mouth daily.    . ergocalciferol (VITAMIN D2) 50000 UNITS capsule Take 1 capsule (50,000 Units total) by mouth once a week. 7 capsule 0  . levothyroxine (SYNTHROID, LEVOTHROID) 75 MCG tablet TAKE 1 TABLET (75 MCG TOTAL) BY MOUTH DAILY. 90 tablet 3   No current facility-administered medications on file prior to visit.    Allergies  Allergen Reactions  . Food     MANGO  . Prednisone     REACTION: hives, difficulty breathing, facial swelling.     Past Medical History  Diagnosis Date  . Anxiety   . COPD (chronic obstructive pulmonary disease) ([place])   . GERD (gastroesophageal reflux disease)   . Tobacco abuse   . Osteopenia   . Strain of chest wall 01/20/2011  . PMR (polymyalgia rheumatica) ([facility])   . New onset of headaches     Past Surgical History  Procedure Laterality Date  . Abdominal hysterectomy    . Lumbar fusion    . Appendectomy    . Cholecystectomy    . Hemorrhoid surgery    . Tubal ligation    . Cervical fusion      Family History  Problem Relation Age of Onset  . Heart disease Mother   . Cancer Father     lung/chest wall   . Diabetes Brother     siblings  . Hypertension Sister     x 2  . Heart disease Brother   . Breast cancer Paternal Grandmother   . Heart disease Sister    . Diabetes Sister   . Tuberculosis Sister   . Prostate cancer Father   . Cirrhosis Brother     Social History   Social History  . Marital Status: Married    Spouse Name: N/A  . Number of Children: 1  . Years of Education: N/A   Occupational History  . [employer] as a Forensic psychologist     retired   Social History Main Topics  . Smoking status: Former Smoker -- 1.50 packs/day for 40 years    Types: Cigarettes    Quit date: 02/11/2009  . Smokeless tobacco: Never Used  . Alcohol Use: No  . Drug Use: No  . Sexual Activity: Not on file   Other Topics Concern  . Not on file   Social History Narrative   Lives in [place] with her husband.  Takes care of her two grandchildren, ages 48 and 2, everyday in [place].  Has one son who is 30.   Desires CPR   Would not want prolonged life support if futile   The PMH, PSH, Social History, Family History, Medications, and allergies have been reviewed in [facility], and have been updated if relevant.  Review  of Systems  Constitutional: Negative.   Respiratory: Negative.   Gastrointestinal: Negative.   Skin: Positive for rash.  Neurological: Negative.   Hematological: Negative.   Psychiatric/Behavioral: Negative.   All other systems reviewed and are negative.      Objective:    BP 146/78 mmHg  Pulse 101  Temp(Src) 98.2 F (36.8 C) (Oral)  Wt 178 lb (80.74 kg)  SpO2 94%   Physical Exam  Constitutional: She is oriented to person, place, and time. She appears well-developed and well-nourished. No distress.  HENT:  Head: Normocephalic.  Eyes: Conjunctivae are normal.  Musculoskeletal: Normal range of motion.  Neurological: She is alert and oriented to person, place, and time. No cranial nerve deficit.  Skin: Skin is warm.     Psychiatric: She has a normal mood and affect. Her behavior is normal. Judgment and thought content normal.  Nursing note and vitals reviewed.         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

## 2014-10-19 NOTE — Progress Notes (Signed)
Pre visit review using our clinic review tool, if applicable. No additional management support is needed unless otherwise documented below in the visit note. 

## 2014-10-19 NOTE — Assessment & Plan Note (Signed)
New- exam reassuring.  Localized reaction. Apply cool compresses, oral antihistamines as needed. Call or return to clinic prn if these symptoms worsen or fail to improve as anticipated. The patient indicates understanding of these issues and agrees with the plan.

## 2014-10-19 NOTE — Patient Instructions (Signed)
Great to see you. This is an allergic reaction.  Ok to ice the area and take zyrtec or benadryl for next few days. Please keep me updated.

## 2014-11-22 ENCOUNTER — Ambulatory Visit (INDEPENDENT_AMBULATORY_CARE_PROVIDER_SITE_OTHER): Payer: Medicare Other | Admitting: Family Medicine

## 2014-11-22 ENCOUNTER — Encounter: Payer: Self-pay | Admitting: Family Medicine

## 2014-11-22 ENCOUNTER — Telehealth: Payer: Self-pay

## 2014-11-22 VITALS — BP 124/70 | HR 101 | Temp 98.6°F | Wt 182.5 lb

## 2014-11-22 DIAGNOSIS — J441 Chronic obstructive pulmonary disease with (acute) exacerbation: Secondary | ICD-10-CM

## 2014-11-22 MED ORDER — HYDROCOD POLST-CPM POLST ER 10-8 MG/5ML PO SUER: 5.0000 mL | Freq: Two times a day (BID) | ORAL | Status: DC | PRN

## 2014-11-22 MED ORDER — ALBUTEROL SULFATE HFA 108 (90 BASE) MCG/ACT IN AERS: 2.0000 | INHALATION_SPRAY | Freq: Four times a day (QID) | RESPIRATORY_TRACT | Status: DC | PRN

## 2014-11-22 MED ORDER — DOXYCYCLINE HYCLATE 100 MG PO TABS: 100.0000 mg | ORAL_TABLET | Freq: Two times a day (BID) | ORAL | Status: DC

## 2014-11-22 NOTE — Addendum Note (Signed)
Addended by: Lucille Passy on: 11/22/2014 10:27 AM   Modules accepted: Orders

## 2014-11-22 NOTE — Progress Notes (Signed)
Pre visit review using our clinic review tool, if applicable. No additional management support is needed unless otherwise documented below in the visit note. 

## 2014-11-22 NOTE — Telephone Encounter (Signed)
Pt walked in; was seen earlier today; about 15 mins ago coughed up phlegm that had bright red blood mixed with phelgm.  Pt coughed while at [facility] and was blood tinged mucus (small amt). Pt was concerned since had not seen blood before; spoke with Dr Deborra Medina and  advised could be from irritation and pt will take abx and monitor if condition worsens will cb or go to [facility] for eval. To Dr Deborra Medina as Juluis Rainier.

## 2014-11-22 NOTE — Progress Notes (Signed)
SUBJECTIVE:  Teresa Jensen is a 69 y.o. female who complains of coryza, congestion, sore throat, productive cough, bilateral sinus pain and enlarged tonsils for 7 days. She denies a history of anorexia and chest pain and admits to a history of asthma. Patient denies smoke cigarettes.   Current Outpatient Prescriptions on File Prior to Visit  Medication Sig Dispense Refill  . aspirin 81 MG tablet Take 81 mg by mouth every other day.     . Calcium Citrate-Vitamin D (GNP CALCIUM CITRATE +D3 PO) Take 1 tablet by mouth daily.    . ergocalciferol (VITAMIN D2) 50000 UNITS capsule Take 1 capsule (50,000 Units total) by mouth once a week. 7 capsule 0  . levothyroxine (SYNTHROID, LEVOTHROID) 75 MCG tablet TAKE 1 TABLET (75 MCG TOTAL) BY MOUTH DAILY. 90 tablet 3   No current facility-administered medications on file prior to visit.    Allergies  Allergen Reactions  . Food     MANGO  . Prednisone     REACTION: hives, difficulty breathing, facial swelling.     Past Medical History  Diagnosis Date  . Anxiety   . COPD (chronic obstructive pulmonary disease) ([place])   . GERD (gastroesophageal reflux disease)   . Tobacco abuse   . Osteopenia   . Strain of chest wall 01/20/2011  . PMR (polymyalgia rheumatica) ([facility])   . New onset of headaches     Past Surgical History  Procedure Laterality Date  . Abdominal hysterectomy    . Lumbar fusion    . Appendectomy    . Cholecystectomy    . Hemorrhoid surgery    . Tubal ligation    . Cervical fusion      Family History  Problem Relation Age of Onset  . Heart disease Mother   . Cancer Father     lung/chest wall   . Diabetes Brother     siblings  . Hypertension Sister     x 2  . Heart disease Brother   . Breast cancer Paternal Grandmother   . Heart disease Sister   . Diabetes Sister   . Tuberculosis Sister   . Prostate cancer Father   . Cirrhosis Brother     Social History   Social History  . Marital Status: Married    Spouse  Name: N/A  . Number of Children: 1  . Years of Education: N/A   Occupational History  . [employer] as a Forensic psychologist     retired   Social History Main Topics  . Smoking status: Former Smoker -- 1.50 packs/day for 40 years    Types: Cigarettes    Quit date: 02/11/2009  . Smokeless tobacco: Never Used  . Alcohol Use: No  . Drug Use: No  . Sexual Activity: Not on file   Other Topics Concern  . Not on file   Social History Narrative   Lives in [place] with her husband.  Takes care of her two grandchildren, ages 82 and 2, everyday in [place].  Has one son who is 17.   Desires CPR   Would not want prolonged life support if futile   The PMH, PSH, Social History, Family History, Medications, and allergies have been reviewed in [facility], and have been updated if relevant.  OBJECTIVE: BP 124/70 mmHg  Pulse 101  Temp(Src) 98.6 F (37 C) (Oral)  Wt 182 lb 8 oz (82.781 kg)  SpO2 92%  She appears well, vital signs are as noted. Ears normal.  Throat and pharynx  normal.  Neck supple. No adenopathy in the neck. Nose is congested. Sinuses tender. Scattered exp wheezes  ASSESSMENT:  bronchitis  PLAN: Given duration and progression of symptoms, will treat for bacterial process with doxycyline.  Symptomatic therapy suggested: push fluids, rest and return office visit prn if symptoms persist or worsen.. Call or return to clinic prn if these symptoms worsen or fail to improve as anticipated.

## 2014-11-26 ENCOUNTER — Other Ambulatory Visit: Payer: Self-pay | Admitting: Family Medicine

## 2014-11-29 NOTE — Telephone Encounter (Signed)
Is pt to continue high dose?

## 2015-01-04 ENCOUNTER — Ambulatory Visit: Payer: Medicare Other | Admitting: Family Medicine

## 2015-01-20 ENCOUNTER — Encounter: Payer: Self-pay | Admitting: Internal Medicine

## 2015-01-20 ENCOUNTER — Ambulatory Visit (INDEPENDENT_AMBULATORY_CARE_PROVIDER_SITE_OTHER): Payer: Medicare Other | Admitting: Internal Medicine

## 2015-01-20 VITALS — BP 126/78 | HR 71 | Temp 98.8°F | Wt 179.0 lb

## 2015-01-20 DIAGNOSIS — S76111A Strain of right quadriceps muscle, fascia and tendon, initial encounter: Secondary | ICD-10-CM

## 2015-01-20 MED ORDER — IBUPROFEN 600 MG PO TABS: 600.0000 mg | ORAL_TABLET | Freq: Three times a day (TID) | ORAL | Status: DC | PRN

## 2015-01-20 NOTE — Progress Notes (Signed)
Subjective:    Patient ID: Teresa Jensen, female    DOB: 1945-12-17, 70 y.o.   MRN: [id number]  HPI  Pt presents to the clinic today with c/o right leg pain. This started 2 weeks ago. She describes the pain as a constant aching and tightness. Pain can be sharp with certain movements. She has some numbness in the area but denies tingling. The pain is worse with walking, bending forward or walking up stairs. She denies any injury to the leg. She has no pain in her back or her hip. She has not tried anything OTC.  Review of Systems  Past Medical History  Diagnosis Date  . Anxiety   . COPD (chronic obstructive pulmonary disease) ([place])   . GERD (gastroesophageal reflux disease)   . Tobacco abuse   . Osteopenia   . Strain of chest wall 01/20/2011  . PMR (polymyalgia rheumatica) ([facility])   . New onset of headaches     Current Outpatient Prescriptions  Medication Sig Dispense Refill  . aspirin 81 MG tablet Take 81 mg by mouth every other day.     . Calcium Citrate-Vitamin D (GNP CALCIUM CITRATE +D3 PO) Take 1 tablet by mouth daily.    Marland Kitchen levothyroxine (SYNTHROID, LEVOTHROID) 75 MCG tablet TAKE 1 TABLET (75 MCG TOTAL) BY MOUTH DAILY. 90 tablet 3   No current facility-administered medications for this visit.    Allergies  Allergen Reactions  . Food     MANGO  . Prednisone     REACTION: hives, difficulty breathing, facial swelling.   Marland Kitchen Prevnar 13 [Pneumococcal 13-Val Conj Vacc]     Family History  Problem Relation Age of Onset  . Heart disease Mother   . Cancer Father     lung/chest wall   . Diabetes Brother     siblings  . Hypertension Sister     x 2  . Heart disease Brother   . Breast cancer Paternal Grandmother   . Heart disease Sister   . Diabetes Sister   . Tuberculosis Sister   . Prostate cancer Father   . Cirrhosis Brother     Social History   Social History  . Marital Status: Married    Spouse Name: N/A  . Number of Children: 1  . Years of Education: N/A     Occupational History  . [employer] as a Forensic psychologist     retired   Social History Main Topics  . Smoking status: Former Smoker -- 1.50 packs/day for 40 years    Types: Cigarettes    Quit date: 02/11/2009  . Smokeless tobacco: Never Used  . Alcohol Use: No  . Drug Use: No  . Sexual Activity: Not on file   Other Topics Concern  . Not on file   Social History Narrative   Lives in [place] with her husband.  Takes care of her two grandchildren, ages 52 and 2, everyday in [place].  Has one son who is 35.   Desires CPR   Would not want prolonged life support if futile     Constitutional: Denies fever, malaise, fatigue, headache or abrupt weight changes.  Musculoskeletal: Pt reports right leg pain. Denies decrease in range of motion, difficulty with gait, or joint pain and swelling.  Skin: Denies redness, rashes, lesions or ulcercations.  Neurological: Pt reports numbness of right thigh. Denies dizziness, difficulty with memory, difficulty with speech or problems with balance and coordination.    No other specific complaints in a complete  review of systems (except as listed in HPI above).     Objective:   Physical Exam  BP 126/78 mmHg  Pulse 71  Temp(Src) 98.8 F (37.1 C) (Oral)  Wt 179 lb (81.194 kg)  SpO2 97% Wt Readings from Last 3 Encounters:  01/20/15 179 lb (81.194 kg)  11/22/14 182 lb 8 oz (82.781 kg)  10/19/14 178 lb (80.74 kg)    General: Appears her stated age, obese in NAD. Skin: Warm, dry and intact. No rashes, lesions or ulcerations noted. Musculoskeletal: Normal flexion, extension, internal and external rotation of the right hip. Normal flexion and extension. Pain with palpation of the right lateral quad. No swelling noted. No difficulty with gait.  Neurological: Alert and oriented. Sensation intact to RLE.   BMET    Component Value Date/Time   NA 137 10/05/2014 1056   K 4.3 10/05/2014 1056   CL 101 10/05/2014 1056   CO2 26 10/05/2014  1056   GLUCOSE 97 10/05/2014 1056   BUN 10 10/05/2014 1056   CREATININE 0.67 10/05/2014 1056   CALCIUM 9.6 10/05/2014 1056   GFRNONAA >60 03/28/2008 2301   GFRAA  03/28/2008 2301    >60        The eGFR has been calculated using the MDRD equation. This calculation has not been validated in all clinical situations. eGFR's persistently <60 mL/min signify possible Chronic Kidney Disease.    Lipid Panel     Component Value Date/Time   CHOL 132 10/05/2014 1056   TRIG 94.0 10/05/2014 1056   HDL 43.20 10/05/2014 1056   CHOLHDL 3 10/05/2014 1056   VLDL 18.8 10/05/2014 1056   LDLCALC 70 10/05/2014 1056    CBC    Component Value Date/Time   WBC 10.4 10/05/2014 1056   RBC 5.09 10/05/2014 1056   HGB 14.9 10/05/2014 1056   HCT 45.5 10/05/2014 1056   PLT 338.0 10/05/2014 1056   MCV 89.4 10/05/2014 1056   MCHC 32.7 10/05/2014 1056   RDW 14.7 10/05/2014 1056   LYMPHSABS 3.0 06/17/2012 0923   MONOABS 1.0 06/17/2012 0923   EOSABS 0.1 06/17/2012 0923   BASOSABS 0.1 06/17/2012 0923    Hgb A1C Lab Results  Component Value Date   HGBA1C 6.0 12/30/2012         Assessment & Plan:   Right leg pain:  ? Quadriceps muscle strain eRx for Ibuprofen 600 mg BID prn with food Stretching exercises given If persists, consider referral to PT for further evaluation  RTC as needed or if symptoms persist or worsen

## 2015-01-20 NOTE — Progress Notes (Signed)
Pre visit review using our clinic review tool, if applicable. No additional management support is needed unless otherwise documented below in the visit note. 

## 2015-01-20 NOTE — Patient Instructions (Signed)
Quadriceps Strain With Strain A strain is a tear in a muscle or the tendon that attaches the muscle to bone. A quadriceps strain is a tear in the muscles on the front of the thigh (quadriceps muscles) or their tendons. The quadriceps muscles are important for straightening the knee and bending the hip. The condition is characterized by pain, inflammation, and reduced function of these muscles. Strains are classified into three categories. Grade 1 strains cause pain, but the tendon is not lengthened. Grade 2 strains include a lengthened ligament due to the ligament being stretched or partially ruptured. With grade 2 strains there is still function, although the function may be diminished. Grade 3 strains are characterized by a complete tear of the tendon or muscle, and function is usually impaired.  SYMPTOMS   Pain, tenderness, inflammation, and/or bruising (contusion) over the quadriceps muscles  Pain that worsens with use of the quadriceps muscles.  Muscle spasm in the thigh.  Difficulty with common tasks that involve the quadriceps muscle, such as walking.  A crackling sound (crepitation) when the tendon is moved or touched.  Loss of fullness of the muscle or bulging within the area of muscle with complete rupture. CAUSES  A strain occurs when a force is placed on the muscle or tendon that is greater than it can withstand. Common mechanisms of injury include:  Repetitive strenuous use of the quadriceps muscles. This may be due to an increase in the intensity, frequency, or duration of exercise.  Direct trauma to the quadriceps muscles or tendons. RISK INCREASES WITH:  Activities that involve forceful contractions of the quadriceps muscles (jumping or sprinting).  Contact sports (soccer or football).  Poor strength and flexibility.  Failure to warm-up properly before activity.  Previous injury to the thigh or knee. PREVENTION  Warm up and stretch properly before activity.  Allow  for adequate recovery between workouts.  Maintain physical fitness:  Strength, flexibility, and endurance.  Cardiovascular fitness.  Wear properly fitted and padded protective equipment. PROGNOSIS  If treated properly, then quadriceps muscles strains are usually curable within 6 weeks.  RELATED COMPLICATIONS   Prolonged healing time, if improperly treated or re-injured.  Recurrent symptoms that result in a chronic problem.  Recurrence of symptoms if activity is resumed too soon. TREATMENT  Treatment initially involves the use of ice and medication to help reduce pain and inflammation. The use of strengthening and stretching exercises may help reduce pain with activity. These exercises may be performed at home or with referral to a therapist. Crutches may be recommended to allow the muscle to rest until walking can be completed without limping. Surgery is rarely necessary for this injury, but may be considered if the injury involves a grade 3 strain, or if symptoms persist for greater than 3 months despite non-surgical (conservative) treatment.  MEDICATION  If pain medication is necessary, then nonsteroidal anti-inflammatory medications, such as aspirin and ibuprofen, or other minor pain relievers, such as acetaminophen, are often recommended.  Do not take pain medication for 7 days before surgery.  Prescription pain relievers may be given if deemed necessary by your caregiver. Use only as directed and only as much as you need.  Ointments applied to the skin may be helpful.  Corticosteroid injections may be given by your caregiver. These injections should be reserved for the most serious cases, because they may only be given a certain number of times. HEAT AND COLD  Cold treatment (icing) relieves pain and reduces inflammation. Cold treatment should be  applied for 10 to 15 minutes every 2 to 3 hours for inflammation and pain and immediately after any activity that aggravates your  symptoms. Use ice packs or massage the area with a piece of ice (ice massage).  Heat treatment may be used prior to performing the stretching and strengthening activities prescribed by your caregiver, physical therapist, or athletic trainer. Use a heat pack or soak the injury in warm water. SEEK MEDICAL CARE IF:  Treatment seems to offer no benefit, or the condition worsens.  Any medications produce adverse side effects. EXERCISES  RANGE OF MOTION (ROM) AND STRETCHING EXERCISES - Quadriceps Strain These exercises may help you when beginning to rehabilitate your injury. Your symptoms may resolve with or without further involvement from your physician, physical therapist or athletic trainer. While completing these exercises, remember:   Restoring tissue flexibility helps normal motion to return to the joints. This allows healthier, less painful movement and activity.  An effective stretch should be held for at least 30 seconds.  A stretch should never be painful. You should only feel a gentle lengthening or release in the stretched tissue. RANGE OF MOTION - Knee Flexion, Active  Lie on your back with both knees straight. (If this causes back discomfort, bend your opposite knee, placing your foot flat on the floor.)  Slowly slide your heel back toward your buttocks until you feel a gentle stretch in the front of your knee or thigh.  Hold for __________ seconds. Slowly slide your heel back to the starting position. Repeat __________ times. Complete this exercise __________ times per day.  STRETCH - Quadriceps, Prone  Lie on your stomach on a firm surface, such as a bed or padded floor.  Bend your right / left knee and grasp your ankle. If you are unable to reach, your ankle or pant leg, use a belt around your foot to lengthen your reach.  Gently pull your heel toward your buttocks. Your knee should not slide out to the side. You should feel a stretch in the front of your thigh and/or  knee.  Hold this position for __________ seconds. Repeat __________ times. Complete this stretch __________ times per day.  STRETCHING - Hip Flexors, Lunge  Half kneel with your right / left knee on the floor and your opposite knee bent and directly over your ankle.  Keep good posture with your head over your shoulders. Tighten your buttocks to point your tailbone downward; this will prevent your back from arching too much.  You should feel a gentle stretch in the front of your thigh and/or hip. If you do not feel any resistance, slightly slide your opposite foot forward and then slowly lunge forward so your knee once again lines up over your ankle. Be sure your tailbone remains pointed downward.  Hold this stretch for __________ seconds. Repeat __________ times. Complete this stretch __________ times per day. STRENGTHENING EXERCISES - Quadriceps Strain These exercises may help you when beginning to rehabilitate your injury. They may resolve your symptoms with or without further involvement from your physician, physical therapist or athletic trainer. While completing these exercises, remember:   Muscles can gain both the endurance and the strength needed for everyday activities through controlled exercises.  Complete these exercises as instructed by your physician, physical therapist or athletic trainer. Progress the resistance and repetitions only as guided. STRENGTH - Quadriceps, Isometrics  Lie on your back with your right / left leg extended and your opposite knee bent.  Gradually tense the muscles  in the front of your right / left thigh. You should see either your knee cap slide up toward your hip or increased dimpling just above the knee. This motion will push the back of the knee down toward the floor/mat/bed on which you are lying.  Hold the muscle as tight as you can without increasing your pain for __________ seconds.  Relax the muscles slowly and completely in between each  repetition. Repeat __________ times. Complete this exercise __________ times per day.  STRENGTH - Quadriceps, Short Arcs   Lie on your back. Place a __________ inch towel roll under your knee so that the knee slightly bends.  Raise only your lower leg by tightening the muscles in the front of your thigh. Do not allow your thigh to rise.  Hold this position for __________ seconds. Repeat __________ times. Complete this exercise __________ times per day.  OPTIONAL ANKLE WEIGHTS: Begin with ____________________, but DO NOT exceed ____________________. Increase in1 lb/0.5 kg increments. STRENGTH - Quadriceps, Straight Leg Raises  Quality counts! Watch for signs that the quadriceps muscle is working to insure you are strengthening the correct muscles and not "cheating" by substituting with healthier muscles.  Lay on your back with your right / left leg extended and your opposite knee bent.  Tense the muscles in the front of your right / left thigh. You should see either your knee cap slide up or increased dimpling just above the knee. Your thigh may even quiver.  Tighten these muscles even more and raise your leg 4 to 6 inches off the floor. Hold for __________ seconds.  Keeping these muscles tense, lower your leg.  Relax the muscles slowly and completely in between each repetition. Repeat __________ times. Complete this exercise __________ times per day.  STRENGTH - Quadriceps, Wall Slides  Follow guidelines for form closely. Increased knee pain often results from poorly placed feet or knees.  Lean against a smooth wall or door and walk your feet out 18-24 inches. Place your feet hip-width apart.  Slowly slide down the wall or door until your knees bend __________ degrees.* Keep your knees over your heels, not your toes, and in line with your hips, not falling to either side.  Hold for __________ seconds. Stand up to rest for __________ seconds in between each repetition. Repeat  __________ times. Complete this exercise __________ times per day. * Your physician, physical therapist or athletic trainer will alter this angle based on your symptoms and progress. STRENGTH - Quadriceps, Step-Ups   Use a thick book, step or step stool that is __________ inches tall.  Holding a wall or counter for balance only, not support.  Slowly step-up with your right / left foot, keeping your knee in line with your hip and foot. Do not allow your knee to bend so far that you cannot see your toes.  Slowly unlock your knee and lower yourself to the starting position. Your muscles, not gravity, should lower you. Repeat __________ times. Complete this exercise __________ times per day.   This information is not intended to replace advice given to you by your health care provider. Make sure you discuss any questions you have with your health care provider.   Document Released: 12/18/2004 Document Revised: 05/04/2014 Document Reviewed: 04/01/2008 [employer] Interactive Patient Education 2016 [employer]. Quadriceps Strain With Strain A strain is a tear in a muscle or the tendon that attaches the muscle to bone. A quadriceps strain is a tear in the muscles on the front  of the thigh (quadriceps muscles) or their tendons. The quadriceps muscles are important for straightening the knee and bending the hip. The condition is characterized by pain, inflammation, and reduced function of these muscles. Strains are classified into three categories. Grade 1 strains cause pain, but the tendon is not lengthened. Grade 2 strains include a lengthened ligament due to the ligament being stretched or partially ruptured. With grade 2 strains there is still function, although the function may be diminished. Grade 3 strains are characterized by a complete tear of the tendon or muscle, and function is usually impaired.  SYMPTOMS   Pain, tenderness, inflammation, and/or bruising (contusion) over the quadriceps  muscles  Pain that worsens with use of the quadriceps muscles.  Muscle spasm in the thigh.  Difficulty with common tasks that involve the quadriceps muscle, such as walking.  A crackling sound (crepitation) when the tendon is moved or touched.  Loss of fullness of the muscle or bulging within the area of muscle with complete rupture. CAUSES  A strain occurs when a force is placed on the muscle or tendon that is greater than it can withstand. Common mechanisms of injury include:  Repetitive strenuous use of the quadriceps muscles. This may be due to an increase in the intensity, frequency, or duration of exercise.  Direct trauma to the quadriceps muscles or tendons. RISK INCREASES WITH:  Activities that involve forceful contractions of the quadriceps muscles (jumping or sprinting).  Contact sports (soccer or football).  Poor strength and flexibility.  Failure to warm-up properly before activity.  Previous injury to the thigh or knee. PREVENTION  Warm up and stretch properly before activity.  Allow for adequate recovery between workouts.  Maintain physical fitness:  Strength, flexibility, and endurance.  Cardiovascular fitness.  Wear properly fitted and padded protective equipment. PROGNOSIS  If treated properly, then quadriceps muscles strains are usually curable within 6 weeks.  RELATED COMPLICATIONS   Prolonged healing time, if improperly treated or re-injured.  Recurrent symptoms that result in a chronic problem.  Recurrence of symptoms if activity is resumed too soon. TREATMENT  Treatment initially involves the use of ice and medication to help reduce pain and inflammation. The use of strengthening and stretching exercises may help reduce pain with activity. These exercises may be performed at home or with referral to a therapist. Crutches may be recommended to allow the muscle to rest until walking can be completed without limping. Surgery is rarely necessary  for this injury, but may be considered if the injury involves a grade 3 strain, or if symptoms persist for greater than 3 months despite non-surgical (conservative) treatment.  MEDICATION  If pain medication is necessary, then nonsteroidal anti-inflammatory medications, such as aspirin and ibuprofen, or other minor pain relievers, such as acetaminophen, are often recommended.  Do not take pain medication for 7 days before surgery.  Prescription pain relievers may be given if deemed necessary by your caregiver. Use only as directed and only as much as you need.  Ointments applied to the skin may be helpful.  Corticosteroid injections may be given by your caregiver. These injections should be reserved for the most serious cases, because they may only be given a certain number of times. HEAT AND COLD  Cold treatment (icing) relieves pain and reduces inflammation. Cold treatment should be applied for 10 to 15 minutes every 2 to 3 hours for inflammation and pain and immediately after any activity that aggravates your symptoms. Use ice packs or massage the area with  a piece of ice (ice massage).  Heat treatment may be used prior to performing the stretching and strengthening activities prescribed by your caregiver, physical therapist, or athletic trainer. Use a heat pack or soak the injury in warm water. SEEK MEDICAL CARE IF:  Treatment seems to offer no benefit, or the condition worsens.  Any medications produce adverse side effects. EXERCISES  RANGE OF MOTION (ROM) AND STRETCHING EXERCISES - Quadriceps Strain These exercises may help you when beginning to rehabilitate your injury. Your symptoms may resolve with or without further involvement from your physician, physical therapist or athletic trainer. While completing these exercises, remember:   Restoring tissue flexibility helps normal motion to return to the joints. This allows healthier, less painful movement and activity.  An effective  stretch should be held for at least 30 seconds.  A stretch should never be painful. You should only feel a gentle lengthening or release in the stretched tissue. RANGE OF MOTION - Knee Flexion, Active  Lie on your back with both knees straight. (If this causes back discomfort, bend your opposite knee, placing your foot flat on the floor.)  Slowly slide your heel back toward your buttocks until you feel a gentle stretch in the front of your knee or thigh.  Hold for __________ seconds. Slowly slide your heel back to the starting position. Repeat __________ times. Complete this exercise __________ times per day.  STRETCH - Quadriceps, Prone  Lie on your stomach on a firm surface, such as a bed or padded floor.  Bend your right / left knee and grasp your ankle. If you are unable to reach, your ankle or pant leg, use a belt around your foot to lengthen your reach.  Gently pull your heel toward your buttocks. Your knee should not slide out to the side. You should feel a stretch in the front of your thigh and/or knee.  Hold this position for __________ seconds. Repeat __________ times. Complete this stretch __________ times per day.  STRETCHING - Hip Flexors, Lunge  Half kneel with your right / left knee on the floor and your opposite knee bent and directly over your ankle.  Keep good posture with your head over your shoulders. Tighten your buttocks to point your tailbone downward; this will prevent your back from arching too much.  You should feel a gentle stretch in the front of your thigh and/or hip. If you do not feel any resistance, slightly slide your opposite foot forward and then slowly lunge forward so your knee once again lines up over your ankle. Be sure your tailbone remains pointed downward.  Hold this stretch for __________ seconds. Repeat __________ times. Complete this stretch __________ times per day. STRENGTHENING EXERCISES - Quadriceps Strain These exercises may help you  when beginning to rehabilitate your injury. They may resolve your symptoms with or without further involvement from your physician, physical therapist or athletic trainer. While completing these exercises, remember:   Muscles can gain both the endurance and the strength needed for everyday activities through controlled exercises.  Complete these exercises as instructed by your physician, physical therapist or athletic trainer. Progress the resistance and repetitions only as guided. STRENGTH - Quadriceps, Isometrics  Lie on your back with your right / left leg extended and your opposite knee bent.  Gradually tense the muscles in the front of your right / left thigh. You should see either your knee cap slide up toward your hip or increased dimpling just above the knee. This motion will push  the back of the knee down toward the floor/mat/bed on which you are lying.  Hold the muscle as tight as you can without increasing your pain for __________ seconds.  Relax the muscles slowly and completely in between each repetition. Repeat __________ times. Complete this exercise __________ times per day.  STRENGTH - Quadriceps, Short Arcs   Lie on your back. Place a __________ inch towel roll under your knee so that the knee slightly bends.  Raise only your lower leg by tightening the muscles in the front of your thigh. Do not allow your thigh to rise.  Hold this position for __________ seconds. Repeat __________ times. Complete this exercise __________ times per day.  OPTIONAL ANKLE WEIGHTS: Begin with ____________________, but DO NOT exceed ____________________. Increase in1 lb/0.5 kg increments. STRENGTH - Quadriceps, Straight Leg Raises  Quality counts! Watch for signs that the quadriceps muscle is working to insure you are strengthening the correct muscles and not "cheating" by substituting with healthier muscles.  Lay on your back with your right / left leg extended and your opposite knee  bent.  Tense the muscles in the front of your right / left thigh. You should see either your knee cap slide up or increased dimpling just above the knee. Your thigh may even quiver.  Tighten these muscles even more and raise your leg 4 to 6 inches off the floor. Hold for __________ seconds.  Keeping these muscles tense, lower your leg.  Relax the muscles slowly and completely in between each repetition. Repeat __________ times. Complete this exercise __________ times per day.  STRENGTH - Quadriceps, Wall Slides  Follow guidelines for form closely. Increased knee pain often results from poorly placed feet or knees.  Lean against a smooth wall or door and walk your feet out 18-24 inches. Place your feet hip-width apart.  Slowly slide down the wall or door until your knees bend __________ degrees.* Keep your knees over your heels, not your toes, and in line with your hips, not falling to either side.  Hold for __________ seconds. Stand up to rest for __________ seconds in between each repetition. Repeat __________ times. Complete this exercise __________ times per day. * Your physician, physical therapist or athletic trainer will alter this angle based on your symptoms and progress. STRENGTH - Quadriceps, Step-Ups   Use a thick book, step or step stool that is __________ inches tall.  Holding a wall or counter for balance only, not support.  Slowly step-up with your right / left foot, keeping your knee in line with your hip and foot. Do not allow your knee to bend so far that you cannot see your toes.  Slowly unlock your knee and lower yourself to the starting position. Your muscles, not gravity, should lower you. Repeat __________ times. Complete this exercise __________ times per day.   This information is not intended to replace advice given to you by your health care provider. Make sure you discuss any questions you have with your health care provider.   Document Released:  12/18/2004 Document Revised: 05/04/2014 Document Reviewed: 04/01/2008 [employer] Interactive Patient Education [employer].

## 2015-02-08 ENCOUNTER — Ambulatory Visit (INDEPENDENT_AMBULATORY_CARE_PROVIDER_SITE_OTHER)
Admission: RE | Admit: 2015-02-08 | Discharge: 2015-02-08 | Disposition: A | Discharge status: Home / Self Care | Payer: Medicare Other | Source: Ambulatory Visit | Attending: Primary Care | Admitting: Primary Care

## 2015-02-08 ENCOUNTER — Ambulatory Visit (INDEPENDENT_AMBULATORY_CARE_PROVIDER_SITE_OTHER): Payer: Medicare Other | Admitting: Primary Care

## 2015-02-08 ENCOUNTER — Encounter: Payer: Self-pay | Admitting: Primary Care

## 2015-02-08 VITALS — HR 116 | Temp 101.3°F | Wt 183.1 lb

## 2015-02-08 DIAGNOSIS — R05 Cough: Secondary | ICD-10-CM

## 2015-02-08 DIAGNOSIS — R059 Cough, unspecified: Secondary | ICD-10-CM

## 2015-02-08 MED ORDER — GUAIFENESIN-CODEINE 100-10 MG/5ML PO SYRP: 5.0000 mL | ORAL_SOLUTION | Freq: Every evening | ORAL | Status: DC | PRN

## 2015-02-08 MED ORDER — LEVOFLOXACIN 500 MG PO TABS: 500.0000 mg | ORAL_TABLET | Freq: Every day | ORAL | Status: DC

## 2015-02-08 NOTE — Progress Notes (Signed)
Subjective:    Patient ID: Teresa Jensen, female    DOB: [birth year], 70 y.o.   MRN: [id number]  HPI  Teresa Jensen is a 70 year old female who presents today with a chief complaint of fever. Teresa Jensen fever was 103.0 this morning and is 101.3 today in the office. She also reports headache, cough, nasal congestion, sore throat, chills, body aches. Teresa Jensen symptoms have been present since yesterday afternoon. She's taken tylenol OTC for Teresa Jensen symptoms with some reduction in fevers. She did receive the flu shot this season. Teresa Jensen most bothersome symptom is cough and fatigue.   Review of Systems  Constitutional: Positive for fever, chills and fatigue.  HENT: Positive for congestion and sore throat.   Respiratory: Positive for cough and shortness of breath. Negative for wheezing.   Cardiovascular: Negative for chest pain.  Neurological: Positive for headaches.       Past Medical History  Diagnosis Date  . Anxiety   . COPD (chronic obstructive pulmonary disease) ([place])   . GERD (gastroesophageal reflux disease)   . Tobacco abuse   . Osteopenia   . Strain of chest wall 01/20/2011  . PMR (polymyalgia rheumatica) ([facility])   . New onset of headaches     Social History   Social History  . Marital Status: Married    Spouse Name: N/A  . Number of Children: 1  . Years of Education: N/A   Occupational History  . [employer] as a Forensic psychologist     retired   Social History Main Topics  . Smoking status: Former Smoker -- 1.50 packs/day for 40 years    Types: Cigarettes    Quit date: 02/11/2009  . Smokeless tobacco: Never Used  . Alcohol Use: No  . Drug Use: No  . Sexual Activity: Not on file   Other Topics Concern  . Not on file   Social History Narrative   Lives in [place] with Teresa Jensen husband.  Takes care of Teresa Jensen two grandchildren, ages 27 and 2, everyday in [place].  Has one son who is 12.   Desires CPR   Would not want prolonged life support if futile    Past Surgical History  Procedure  Laterality Date  . Abdominal hysterectomy    . Lumbar fusion    . Appendectomy    . Cholecystectomy    . Hemorrhoid surgery    . Tubal ligation    . Cervical fusion      Family History  Problem Relation Age of Onset  . Heart disease Mother   . Cancer Father     lung/chest wall   . Diabetes Brother     siblings  . Hypertension Sister     x 2  . Heart disease Brother   . Breast cancer Paternal Grandmother   . Heart disease Sister   . Diabetes Sister   . Tuberculosis Sister   . Prostate cancer Father   . Cirrhosis Brother     Allergies  Allergen Reactions  . Food     MANGO  . Prednisone     REACTION: hives, difficulty breathing, facial swelling.   Marland Kitchen Prevnar 13 [Pneumococcal 13-Val Conj Vacc]     Current Outpatient Prescriptions on File Prior to Visit  Medication Sig Dispense Refill  . aspirin 81 MG tablet Take 81 mg by mouth every other day.     . Calcium Citrate-Vitamin D (GNP CALCIUM CITRATE +D3 PO) Take 1 tablet by mouth daily.    Marland Kitchen levothyroxine (  SYNTHROID, LEVOTHROID) 75 MCG tablet TAKE 1 TABLET (75 MCG TOTAL) BY MOUTH DAILY. 90 tablet 3   No current facility-administered medications on file prior to visit.    Pulse 116  Temp(Src) 101.3 F (38.5 C) (Oral)  Wt 183 lb 1.9 oz (83.063 kg)  SpO2 90%    Objective:   Physical Exam  Constitutional: She appears ill.  HENT:  Right Ear: Tympanic membrane and ear canal normal.  Left Ear: Tympanic membrane and ear canal normal.  Nose: Mucosal edema present. Right sinus exhibits no maxillary sinus tenderness and no frontal sinus tenderness. Left sinus exhibits no maxillary sinus tenderness and no frontal sinus tenderness.  Mouth/Throat: Oropharynx is clear and moist.  Eyes: Conjunctivae are normal.  Neck: Neck supple.  Cardiovascular: Regular rhythm.   Sinus tachycardia  Pulmonary/Chest: Effort normal. She has no wheezes.  Crackles to left base, rhonchi to upper lobes bilaterally  Lymphadenopathy:    She has  no cervical adenopathy.  Skin: Skin is warm and dry.          Assessment & Plan:  CAP vs. Bronchitis:  High fevers, cough, fatigue since yesterday. Appears ill. Vitals with sinus tachycardia and temp of 101 after tylenol. Exam with crackles to left base. Suspect pneumonia, but could also be bronchitis, regardless, this process appears to be bacterial in nature. Will treat. Dees not appear toxic. Chest xray pending. RX for Levaquin course. Cheratussin AC provided for HS cough. Mucinex, fluids, rest. Return precautions provided.

## 2015-02-08 NOTE — Progress Notes (Signed)
Pre visit review using our clinic review tool, if applicable. No additional management support is needed unless otherwise documented below in the visit note. 

## 2015-02-08 NOTE — Patient Instructions (Signed)
Complete xray(s) prior to leaving today. I will notify you of your results once received.  Start levofloxacin antibiotics for presumed pneumonia. Take 1 tablet by mouth once daily for 7 days.  You may take the Hycodan cough suppressant at bedtime as needed for cough and rest. Caution this medication contains codeine and will make you feel drowsy.  Continue tylenol or ibuprofen as needed for fevers. Do not exceed 3000 mg of tylenol or 2400 mg of ibuprofen in 24 hours.  Increase consumption of fluids and rest.  Please call me if no improvement in symptoms in 3-4 days.  It was a pleasure meeting you!

## 2015-02-18 ENCOUNTER — Encounter: Payer: Self-pay | Admitting: Primary Care

## 2015-02-18 ENCOUNTER — Ambulatory Visit (INDEPENDENT_AMBULATORY_CARE_PROVIDER_SITE_OTHER): Payer: Medicare Other | Admitting: Primary Care

## 2015-02-18 VITALS — BP 144/84 | HR 104 | Temp 98.1°F | Wt 181.8 lb

## 2015-02-18 DIAGNOSIS — J209 Acute bronchitis, unspecified: Secondary | ICD-10-CM | POA: Diagnosis not present

## 2015-02-18 DIAGNOSIS — R062 Wheezing: Secondary | ICD-10-CM | POA: Diagnosis not present

## 2015-02-18 MED ORDER — IPRATROPIUM BROMIDE 0.02 % IN SOLN
0.5000 mg | Freq: Once | RESPIRATORY_TRACT | Status: AC
  Administered 2015-02-18: 0.5 mg via RESPIRATORY_TRACT

## 2015-02-18 MED ORDER — ALBUTEROL SULFATE (2.5 MG/3ML) 0.083% IN NEBU
2.5000 mg | INHALATION_SOLUTION | Freq: Once | RESPIRATORY_TRACT | Status: AC
  Administered 2015-02-18: 2.5 mg via RESPIRATORY_TRACT

## 2015-02-18 MED ORDER — ALBUTEROL SULFATE HFA 108 (90 BASE) MCG/ACT IN AERS: 2.0000 | INHALATION_SPRAY | Freq: Four times a day (QID) | RESPIRATORY_TRACT | Status: DC | PRN

## 2015-02-18 NOTE — Patient Instructions (Signed)
Start albuterol inhaler. Inhale 2 puffs every 6 hours as needed for wheezing and shortness of breath.  Please notify us if no improvement in breathing/shortness of breath in 24 hours. We have a Saturday Clinic located at the [place] office across the street from [facility]. Please go there or to the hospital if the shortness of breath gets worse.  Cough/Congestion: Try taking Mucinex DM. This will help loosen up the mucous in your chest. Ensure you take this medication with a full glass of water.  Nasal Congestion: Try using Flonase (fluticasone) nasal spray. Instill 2 sprays in each nostril once daily.   Increase consumption of water and rest.  It was a pleasure to see you today!

## 2015-02-18 NOTE — Progress Notes (Signed)
Subjective:    Patient ID: Teresa Jensen, female    DOB: [birth year], 70 y.o.   MRN: [id number]  HPI  Teresa Jensen is a 70 year old female who presents today for follow up of fever and cough. She was evaluated on 02/08/15 with complaints of fever, cough, fatigue. Her chest xray was negative for CAP and was treated for presumed bacterial bronchitis. She was provided with a Levaquin course and Cheratussin AC cough syrup.  Since her last visit she's experiencing chest congestion and shortness of breath. She has a history of COPD and is not managed on an inhaler. She's noticed a "crackle/rattle" in her chest. She completed the antibiotics and is taking the cough medication with temporary improvement. She's also taking tylenol. Overall she's feeling slight improved. She never tried Mucinex as discussed last visit. Her main concern is the wheezing and SOB.  Review of Systems  Constitutional: Negative for fever, chills and fatigue.  HENT: Positive for congestion. Negative for ear pain, sinus pressure and sore throat.   Respiratory: Positive for shortness of breath and wheezing.   Cardiovascular: Negative for chest pain.  Musculoskeletal: Negative for myalgias.  Neurological: Negative for weakness.       Past Medical History  Diagnosis Date  . Anxiety   . COPD (chronic obstructive pulmonary disease) ([place])   . GERD (gastroesophageal reflux disease)   . Tobacco abuse   . Osteopenia   . Strain of chest wall 01/20/2011  . PMR (polymyalgia rheumatica) ([facility])   . New onset of headaches     Social History   Social History  . Marital Status: Married    Spouse Name: N/A  . Number of Children: 1  . Years of Education: N/A   Occupational History  .  Pathology as a Forensic psychologist     retired   Social History Main Topics  . Smoking status: Former Smoker -- 1.50 packs/day for 40 years    Types: Cigarettes    Quit date: 02/11/2009  . Smokeless tobacco: Never Used  . Alcohol Use: No  .  Drug Use: No  . Sexual Activity: Not on file   Other Topics Concern  . Not on file   Social History Narrative   Lives in [place] with her husband.  Takes care of her two grandchildren, ages 70 and 2, everyday in [place].  Has one son who is 46.   Desires CPR   Would not want prolonged life support if futile    Past Surgical History  Procedure Laterality Date  . Abdominal hysterectomy    . Lumbar fusion    . Appendectomy    . Cholecystectomy    . Hemorrhoid surgery    . Tubal ligation    . Cervical fusion      Family History  Problem Relation Age of Onset  . Heart disease Mother   . Cancer Father     lung/chest wall   . Diabetes Brother     siblings  . Hypertension Sister     x 2  . Heart disease Brother   . Breast cancer Paternal Grandmother   . Heart disease Sister   . Diabetes Sister   . Tuberculosis Sister   . Prostate cancer Father   . Cirrhosis Brother     Allergies  Allergen Reactions  . Food     MANGO  . Prednisone     REACTION: hives, difficulty breathing, facial swelling.   Marland Kitchen Prevnar 13 [Pneumococcal 13-Val Conj Vacc]  Current Outpatient Prescriptions on File Prior to Visit  Medication Sig Dispense Refill  . aspirin 81 MG tablet Take 81 mg by mouth every other day.     . Calcium Citrate-Vitamin D (GNP CALCIUM CITRATE +D3 PO) Take 1 tablet by mouth daily.    Marland Kitchen guaiFENesin-codeine (CHERATUSSIN AC) 100-10 MG/5ML syrup Take 5 mLs by mouth at bedtime as needed for cough. 50 mL 0  . levothyroxine (SYNTHROID, LEVOTHROID) 75 MCG tablet TAKE 1 TABLET (75 MCG TOTAL) BY MOUTH DAILY. 90 tablet 3   No current facility-administered medications on file prior to visit.    BP 144/84 mmHg  Pulse 104  Temp(Src) 98.1 F (36.7 C) (Oral)  Wt 181 lb 12.8 oz (82.464 kg)  SpO2 98%    Objective:   Physical Exam  Constitutional: She appears well-nourished.  HENT:  Right Ear: Tympanic membrane and ear canal normal.  Left Ear: Tympanic membrane and ear  canal normal.  Nose: Right sinus exhibits no maxillary sinus tenderness and no frontal sinus tenderness. Left sinus exhibits no maxillary sinus tenderness and no frontal sinus tenderness.  Mouth/Throat: Oropharynx is clear and moist.  Neck: Neck supple.  Cardiovascular: Normal rate and regular rhythm.   Pulmonary/Chest: She has wheezes in the right upper field and the left upper field. She has rhonchi in the right upper field, the right lower field, the left upper field and the left lower field.  Lung sounds overall improved. Wheezing new.  Skin: Skin is warm and dry.          Assessment & Plan:  Acute Bronchitis:  Evaluated 1 week ago, provided with levaquin course for possible CAP vs. Bronchitis. Xray negative for pneumonia, treated empirically. Now with wheezing and shortness of breath. Mild-Mod wheezing in all lobes. Stable in office today, no respiratory distress. Allergy to prednisone (hives, throat closure). Duoneb provided in clinic today with improvement in wheezing. Albuterol inhaler sent home with instructions.  Will have her try inhaler and report back if no improvement. ED and return precautions provided.

## 2015-02-18 NOTE — Progress Notes (Signed)
Pre visit review using our clinic review tool, if applicable. No additional management support is needed unless otherwise documented below in the visit note. 

## 2015-04-05 ENCOUNTER — Encounter: Payer: Self-pay | Admitting: Primary Care

## 2015-04-05 ENCOUNTER — Ambulatory Visit (INDEPENDENT_AMBULATORY_CARE_PROVIDER_SITE_OTHER): Payer: Medicare Other | Admitting: Primary Care

## 2015-04-05 ENCOUNTER — Ambulatory Visit: Payer: Medicare Other | Admitting: Internal Medicine

## 2015-04-05 VITALS — BP 124/74 | HR 92 | Temp 98.2°F | Wt 183.4 lb

## 2015-04-05 DIAGNOSIS — R509 Fever, unspecified: Secondary | ICD-10-CM

## 2015-04-05 LAB — POCT RAPID STREP A (OFFICE): Rapid Strep A Screen: NEGATIVE

## 2015-04-05 LAB — POC INFLUENZA A&B (BINAX/QUICKVUE)
Influenza A, POC: NEGATIVE
Influenza B, POC: NEGATIVE

## 2015-04-05 NOTE — Patient Instructions (Signed)
Your strep and flu tests are negative.   Your symptoms are likely related to a viral infection that should dissipate on its own.  Continue tylenol as needed for fevers and body aches.  Please notify me if you develop persistent fevers of 101, start coughing up green mucous, notice increased fatigue or weakness, or feel worse after 1 week of onset of symptoms.   Increase consumption of water intake and rest.  It was a pleasure to see you today!

## 2015-04-05 NOTE — Progress Notes (Signed)
Pre visit review using our clinic review tool, if applicable. No additional management support is needed unless otherwise documented below in the visit note. 

## 2015-04-05 NOTE — Addendum Note (Signed)
Addended by: Jacqualin Combes on: 04/05/2015 04:59 PM   Modules accepted: Orders

## 2015-04-05 NOTE — Progress Notes (Signed)
Subjective:    Patient ID: Teresa Jensen, female    DOB: [birth year], 70 y.o.   MRN: [id number]  HPI  Ms. Abruzzo is a 70 year old female who presents today with a chief complaint of fever. She also reports chills, night sweats, cough, sneezing. Her symptoms began yesterday with fevers and chills. Her fevers are low grade (99.9) on average. She's been around her grandchildren who have had both step throat and flu (diganosed Tuesday and Thrusday last week). She' taken tylenol for her symptoms with improvement. Her last dose of tylenol was at 8 am today.   She was treated in February 2017 for acute bronchitis with a course of Levaquin for presumed pneumonia.  Review of Systems  Constitutional: Positive for fever, chills and fatigue.  HENT: Positive for sneezing. Negative for sinus pressure.   Respiratory: Positive for cough. Negative for shortness of breath.   Cardiovascular: Negative for chest pain.  Gastrointestinal: Positive for nausea.  Musculoskeletal: Negative for myalgias.       Past Medical History  Diagnosis Date  . Anxiety   . COPD (chronic obstructive pulmonary disease) ([place])   . GERD (gastroesophageal reflux disease)   . Tobacco abuse   . Osteopenia   . Strain of chest wall 01/20/2011  . PMR (polymyalgia rheumatica) ([facility])   . New onset of headaches     Social History   Social History  . Marital Status: Married    Spouse Name: N/A  . Number of Children: 1  . Years of Education: N/A   Occupational History  .  Pathology as a Forensic psychologist     retired   Social History Main Topics  . Smoking status: Former Smoker -- 1.50 packs/day for 40 years    Types: Cigarettes    Quit date: 02/11/2009  . Smokeless tobacco: Never Used  . Alcohol Use: No  . Drug Use: No  . Sexual Activity: Not on file   Other Topics Concern  . Not on file   Social History Narrative   Lives in [place] with her husband.  Takes care of her two grandchildren, ages 39 and 2, everyday  in [place].  Has one son who is 76.   Desires CPR   Would not want prolonged life support if futile    Past Surgical History  Procedure Laterality Date  . Abdominal hysterectomy    . Lumbar fusion    . Appendectomy    . Cholecystectomy    . Hemorrhoid surgery    . Tubal ligation    . Cervical fusion      Family History  Problem Relation Age of Onset  . Heart disease Mother   . Cancer Father     lung/chest wall   . Diabetes Brother     siblings  . Hypertension Sister     x 2  . Heart disease Brother   . Breast cancer Paternal Grandmother   . Heart disease Sister   . Diabetes Sister   . Tuberculosis Sister   . Prostate cancer Father   . Cirrhosis Brother     Allergies  Allergen Reactions  . Food     MANGO  . Prednisone     REACTION: hives, difficulty breathing, facial swelling.   Marland Kitchen Prevnar 13 [Pneumococcal 13-Val Conj Vacc]     Current Outpatient Prescriptions on File Prior to Visit  Medication Sig Dispense Refill  . aspirin 81 MG tablet Take 81 mg by mouth every other day.     Marland Kitchen  Calcium Citrate-Vitamin D (GNP CALCIUM CITRATE +D3 PO) Take 1 tablet by mouth daily.    Marland Kitchen levothyroxine (SYNTHROID, LEVOTHROID) 75 MCG tablet TAKE 1 TABLET (75 MCG TOTAL) BY MOUTH DAILY. 90 tablet 3   No current facility-administered medications on file prior to visit.    BP 124/74 mmHg  Pulse 92  Temp(Src) 98.2 F (36.8 C) (Oral)  Wt 183 lb 6.4 oz (83.19 kg)  SpO2 94%    Objective:   Physical Exam  Constitutional: She appears well-nourished.  HENT:  Right Ear: Tympanic membrane and ear canal normal.  Left Ear: Tympanic membrane and ear canal normal.  Nose: Right sinus exhibits no maxillary sinus tenderness and no frontal sinus tenderness. Left sinus exhibits no maxillary sinus tenderness and no frontal sinus tenderness.  Mouth/Throat: Oropharynx is clear and moist.  Eyes: Conjunctivae are normal.  Neck: Neck supple.  Cardiovascular: Normal rate and regular rhythm.     Pulmonary/Chest: Effort normal and breath sounds normal. She has no wheezes. She has no rales.  Lymphadenopathy:    She has no cervical adenopathy.  Skin: Skin is warm and dry.          Assessment & Plan:  URI:  Low grade fevers, fatigue, chills x 24 hours. Improvement in fever with tylenol.  Exam with clear lungs, unremarkable HENT exam. Due to recent encounter with her grandchildren that tested positive for flu and strep, will screen her for both. Rapid Flu: Rapid Strep: Suspect viral involvement at this point and will treat with supportive measures. Tylenol, fluids, Robitussin or Delsym PRN.  Return precautions provided.

## 2015-05-20 ENCOUNTER — Ambulatory Visit (INDEPENDENT_AMBULATORY_CARE_PROVIDER_SITE_OTHER)
Admission: RE | Admit: 2015-05-20 | Discharge: 2015-05-20 | Disposition: A | Discharge status: Home / Self Care | Payer: Medicare Other | Source: Ambulatory Visit | Attending: Primary Care | Admitting: Primary Care

## 2015-05-20 ENCOUNTER — Encounter: Payer: Self-pay | Admitting: Primary Care

## 2015-05-20 ENCOUNTER — Telehealth: Payer: Self-pay | Admitting: Family Medicine

## 2015-05-20 ENCOUNTER — Ambulatory Visit (INDEPENDENT_AMBULATORY_CARE_PROVIDER_SITE_OTHER): Payer: Medicare Other | Admitting: Primary Care

## 2015-05-20 VITALS — BP 124/76 | HR 85 | Temp 98.2°F | Wt 181.4 lb

## 2015-05-20 DIAGNOSIS — M79604 Pain in right leg: Secondary | ICD-10-CM | POA: Diagnosis not present

## 2015-05-20 DIAGNOSIS — R0602 Shortness of breath: Secondary | ICD-10-CM

## 2015-05-20 DIAGNOSIS — J449 Chronic obstructive pulmonary disease, unspecified: Secondary | ICD-10-CM | POA: Diagnosis not present

## 2015-05-20 DIAGNOSIS — E2839 Other primary ovarian failure: Secondary | ICD-10-CM | POA: Diagnosis not present

## 2015-05-20 MED ORDER — ALBUTEROL SULFATE HFA 108 (90 BASE) MCG/ACT IN AERS: 2.0000 | INHALATION_SPRAY | Freq: Four times a day (QID) | RESPIRATORY_TRACT | Status: DC | PRN

## 2015-05-20 NOTE — Progress Notes (Signed)
Pre visit review using our clinic review tool, if applicable. No additional management support is needed unless otherwise documented below in the visit note. 

## 2015-05-20 NOTE — Telephone Encounter (Signed)
Pt wanted to switch providers from dr Deborra Medina to [facility] to schedule??

## 2015-05-20 NOTE — Progress Notes (Signed)
Subjective:    Patient ID: Teresa Jensen, female    DOB: 1945-12-27, 70 y.o.   MRN: [id number]  HPI  Teresa Jensen is a 70 year old female who presents today with a chief complaint of lower extremity pain. Her pain is located to the left lower extremity, specifically to the right lateral side. She describes her pain as tightness and painful. Her pain is worse when getting in and out of the car. She does have a history of low back bilaterally. Her pain has been bothersome for 6 months. She was provided with exercises during her last visit, but those haven't worked. She's taken tylenol occasional as needed for pain with improvement.  She underwent xray of her tibia/fibula 1 year ago for complaints of lower extremity pain. Her xray shoed soft tissue calcification, otherwise unremarkable. No recent lumbar xrays on file  2) Shortness of Breath: Diagnosed in 2012 from PFT's, although she does not remember being told. Recently re-discovered on an xray by myself. Prior records state that LABA/ICS would be beneficial, however patient denies every using. She does have an albuterol inhaler at home that she's not used.   Over the past year she's noticed an increase in her shortness of breath upon exertion and now during her daily household chores. She will experience shortness of breath when walking from her car to a store as she will have to stop to rest just after she gets into the store. She also experiences shortness of breath when walking up the stairs in her home. She is requesting a handicap placard as she cannot walk long distances without stopping to rest.  Review of Systems  Constitutional: Negative for fever.  HENT: Negative for congestion.   Respiratory: Positive for shortness of breath and wheezing. Negative for cough.   Cardiovascular: Negative for chest pain and leg swelling.  Musculoskeletal: Positive for arthralgias.       Past Medical History  Diagnosis Date  . Anxiety   . COPD  (chronic obstructive pulmonary disease) ([place])   . GERD (gastroesophageal reflux disease)   . Tobacco abuse   . Osteopenia   . Strain of chest wall 01/20/2011  . PMR (polymyalgia rheumatica) ([facility])   . New onset of headaches      Social History   Social History  . Marital Status: Married    Spouse Name: N/A  . Number of Children: 1  . Years of Education: N/A   Occupational History  .  Pathology as a Forensic psychologist     retired   Social History Main Topics  . Smoking status: Former Smoker -- 1.50 packs/day for 40 years    Types: Cigarettes    Quit date: 02/11/2009  . Smokeless tobacco: Never Used  . Alcohol Use: No  . Drug Use: No  . Sexual Activity: Not on file   Other Topics Concern  . Not on file   Social History Narrative   Lives in [place] with her husband.  Takes care of her two grandchildren, ages 17 and 2, everyday in [place].  Has one son who is 47.   Desires CPR   Would not want prolonged life support if futile    Past Surgical History  Procedure Laterality Date  . Abdominal hysterectomy    . Lumbar fusion    . Appendectomy    . Cholecystectomy    . Hemorrhoid surgery    . Tubal ligation    . Cervical fusion      Family  History  Problem Relation Age of Onset  . Heart disease Mother   . Cancer Father     lung/chest wall   . Diabetes Brother     siblings  . Hypertension Sister     x 2  . Heart disease Brother   . Breast cancer Paternal Grandmother   . Heart disease Sister   . Diabetes Sister   . Tuberculosis Sister   . Prostate cancer Father   . Cirrhosis Brother     Allergies  Allergen Reactions  . Food     MANGO  . Prednisone     REACTION: hives, difficulty breathing, facial swelling.   Marland Kitchen Prevnar 13 [Pneumococcal 13-Val Conj Vacc]     Current Outpatient Prescriptions on File Prior to Visit  Medication Sig Dispense Refill  . aspirin 81 MG tablet Take 81 mg by mouth every other day.     . Calcium Citrate-Vitamin D (GNP CALCIUM  CITRATE +D3 PO) Take 1 tablet by mouth daily.    Marland Kitchen levothyroxine (SYNTHROID, LEVOTHROID) 75 MCG tablet TAKE 1 TABLET (75 MCG TOTAL) BY MOUTH DAILY. 90 tablet 3   No current facility-administered medications on file prior to visit.    BP 124/76 mmHg  Pulse 85  Temp(Src) 98.2 F (36.8 C) (Oral)  Wt 181 lb 6.4 oz (82.283 kg)  SpO2 94%    Objective:   Physical Exam  Constitutional: She appears well-nourished.  Cardiovascular: Normal rate and regular rhythm.   Pulmonary/Chest: She has no decreased breath sounds. She has wheezes in the right upper field and the left upper field. She has no rhonchi.  Musculoskeletal: Normal range of motion.       Right hip: She exhibits normal range of motion, normal strength and no tenderness.       Lumbar back: She exhibits pain. She exhibits normal range of motion and no tenderness.  Negative straight leg raise bilaterally.  Skin: Skin is warm and dry.          Assessment & Plan:

## 2015-05-20 NOTE — Assessment & Plan Note (Signed)
Located to lateral right thigh for the past 6 months. Temporary improvement with tylenol. exercises provided last visit without improvement. Will obtain lumbar and hip xrays today to rule out any cause. May need to consider PT if xrays unremarkable.

## 2015-05-20 NOTE — Telephone Encounter (Signed)
Okay with me. Thanks 

## 2015-05-20 NOTE — Assessment & Plan Note (Signed)
Increased shortness of breath x 1 year. Was unaware that she had COPD until recently when captured on xray.  She is now with DOE that is causing her to rest with household chores and walking in parking lots.  Discussed COPD and treatment. She declines daily LABA/ICS as I do believe she would benefit.  She will try albuterol for now. New RX sent to pharmacy. Exam with mild wheezing to upper fields, otherwise unremarkable.  Will have her call me in 2 weeks with an update. Will complete handicap placard per patient request.

## 2015-05-20 NOTE — Patient Instructions (Signed)
Complete xray(s) prior to leaving today. I will notify you of your results once received.  Use the albuterol inhaler as needed for shortness of breath. Inhale 2 puffs by mouth every 6 hours as needed for wheezing and shortness of breath.  Please notify me if you need your inhaler more than 3 times weekly.  I will work on your handicap placard and call you when ready for pick up.  It was a pleasure to see you today!

## 2015-05-20 NOTE — Telephone Encounter (Signed)
Wataga with me. She is very nice.

## 2015-05-23 ENCOUNTER — Other Ambulatory Visit: Payer: Self-pay | Admitting: Primary Care

## 2015-05-25 NOTE — Telephone Encounter (Signed)
Pt aware ok to switch providers

## 2015-05-31 ENCOUNTER — Ambulatory Visit (INDEPENDENT_AMBULATORY_CARE_PROVIDER_SITE_OTHER): Payer: Medicare Other

## 2015-05-31 ENCOUNTER — Ambulatory Visit (INDEPENDENT_AMBULATORY_CARE_PROVIDER_SITE_OTHER): Payer: Medicare Other | Admitting: Sports Medicine

## 2015-05-31 ENCOUNTER — Encounter: Payer: Self-pay | Admitting: Sports Medicine

## 2015-05-31 VITALS — BP 164/74 | HR 73 | Resp 16

## 2015-05-31 DIAGNOSIS — M79672 Pain in left foot: Secondary | ICD-10-CM

## 2015-05-31 DIAGNOSIS — L84 Corns and callosities: Secondary | ICD-10-CM

## 2015-05-31 NOTE — Progress Notes (Signed)
Patient ID: ELIZ NIGG, female   DOB: 07-28-1945, 70 y.o.   MRN: [id number] Subjective: Teresa Jensen is a 70 y.o. female patient who presents to office for evaluation of Left foot pain secondary to callus skin. Patient complains of pain at the lesion present Left foot at the plantar aspect at the ball. Patient has tried callus remover with no relief in symptoms. Patient denies any other pedal complaints.   Patient Active Problem List   Diagnosis Date Noted  . Pain of right lower extremity 05/20/2015  . 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 Medicare annual wellness visit, subsequent 10/12/2014  . Occipital neuralgia 10/12/2014  . Vitamin D deficiency 10/12/2014  . COPD (chronic obstructive pulmonary disease) ([place]) 06/05/2010  . Hypothyroidism 09/29/2009  . Anxiety state 10/01/2008  . GERD 10/01/2008  . Disorder of bone and cartilage 10/01/2008    Current Outpatient Prescriptions on File Prior to Visit  Medication Sig Dispense Refill  . albuterol (PROVENTIL HFA;VENTOLIN HFA) 108 (90 Base) MCG/ACT inhaler Inhale 2 puffs into the lungs every 6 (six) hours as needed for wheezing or shortness of breath. 1 Inhaler 1  . aspirin 81 MG tablet Take 81 mg by mouth every other day.     . Calcium Citrate-Vitamin D (GNP CALCIUM CITRATE +D3 PO) Take 1 tablet by mouth daily.    Marland Kitchen levothyroxine (SYNTHROID, LEVOTHROID) 75 MCG tablet TAKE 1 TABLET (75 MCG TOTAL) BY MOUTH DAILY. 90 tablet 3   No current facility-administered medications on file prior to visit.    Allergies  Allergen Reactions  . Food     MANGO  . Prednisone     REACTION: hives, difficulty breathing, facial swelling.   Marland Kitchen Prevnar 13 [Pneumococcal 13-Val Conj Vacc]     Objective:  General: Alert and oriented x3 in no acute distress  Dermatology: Keratotic lesion present sub met 1 and sulcus of 2-3 toes with skin lines transversing the lesion, pain is present with direct pressure to the lesion with a central nucleated core  noted, no webspace macerations, no ecchymosis bilateral, all nails x 10 are well manicured.  Vascular: Dorsalis Pedis and Posterior Tibial pedal pulses 2/4, Capillary Fill Time 3 seconds, + pedal hair growth bilateral, no edema bilateral lower extremities, Temperature gradient within normal limits.  Neurology: Johney Maine sensation intact via light touch bilateral.  Musculoskeletal: Mild tenderness with palpation at the keratotic lesion sites on Left, Muscular strength 5/5 in all groups without pain or limitation on range of motion. Asymptomatic hammertoe and pes cavus foot type bilateral.   Xrays  Left Foot   Impression: Mild decreased in osseous mineralization, hammertoe, mild  2nd intermetatarsal space narrowing, midfoot arthritis, posterior and inferior spur, Pes cavus foot type  Assessment and Plan: Problem List Items Addressed This Visit    None    Visit Diagnoses    Left foot pain    -  Primary    Relevant Orders    DG Foot Complete Left    Callus of foot           -Complete examination performed -Xrays reviewed  -Discussed treatment options -Parred keratoic lesions using a chisel15 blade; treated the area with Salinocaine covered with moleskin -Encouraged daily skin emollients -Encouraged use of pumice stone -Advised good supportive shoes and inserts -Patient to return to office as needed or sooner if condition worsens.  Landis Martins, DPM

## 2015-05-31 NOTE — Patient Instructions (Signed)
Okeeffe's healthy feet cream

## 2015-06-10 ENCOUNTER — Other Ambulatory Visit: Payer: Medicare Other

## 2015-06-16 ENCOUNTER — Ambulatory Visit (INDEPENDENT_AMBULATORY_CARE_PROVIDER_SITE_OTHER): Payer: Medicare Other | Admitting: Internal Medicine

## 2015-06-16 ENCOUNTER — Encounter: Payer: Self-pay | Admitting: Internal Medicine

## 2015-06-16 VITALS — BP 140/74 | HR 78 | Temp 99.1°F | Wt 179.0 lb

## 2015-06-16 DIAGNOSIS — L237 Allergic contact dermatitis due to plants, except food: Secondary | ICD-10-CM | POA: Diagnosis not present

## 2015-06-16 MED ORDER — TRIAMCINOLONE ACETONIDE 0.1 % EX CREA: 1.0000 "application " | TOPICAL_CREAM | Freq: Two times a day (BID) | CUTANEOUS | Status: DC

## 2015-06-16 NOTE — Progress Notes (Signed)
Pre visit review using our clinic review tool, if applicable. No additional management support is needed unless otherwise documented below in the visit note. 

## 2015-06-16 NOTE — Patient Instructions (Signed)

## 2015-06-16 NOTE — Progress Notes (Signed)
Subjective:    Patient ID: Teresa Jensen, female    DOB: 11-10-1945, 70 y.o.   MRN: [id number]  HPI  Pt presents to the clinic today with c/o a rash on her face. She noticed this 2 days ago. The rash is very itchy and red. She has noticed some swelling around the rash. She has tried Calamine lotion without any relief. She has been working out in the yard and thinks it may be poison ivy. She has never had shingles or the Zostovax. She denies changes in soaps, lotions or detergents.  Review of Systems      Past Medical History  Diagnosis Date  . Anxiety   . COPD (chronic obstructive pulmonary disease) ([place])   . GERD (gastroesophageal reflux disease)   . Tobacco abuse   . Osteopenia   . Strain of chest wall 01/20/2011  . PMR (polymyalgia rheumatica) ([facility])   . New onset of headaches     Current Outpatient Prescriptions  Medication Sig Dispense Refill  . albuterol (PROVENTIL HFA;VENTOLIN HFA) 108 (90 Base) MCG/ACT inhaler Inhale 2 puffs into the lungs every 6 (six) hours as needed for wheezing or shortness of breath. 1 Inhaler 1  . aspirin 81 MG tablet Take 81 mg by mouth every other day.     . Calcium Citrate-Vitamin D (GNP CALCIUM CITRATE +D3 PO) Take 1 tablet by mouth daily.    Marland Kitchen levothyroxine (SYNTHROID, LEVOTHROID) 75 MCG tablet TAKE 1 TABLET (75 MCG TOTAL) BY MOUTH DAILY. 90 tablet 3  . omeprazole (PRILOSEC) 40 MG capsule Take 40 mg by mouth daily.  12   No current facility-administered medications for this visit.    Allergies  Allergen Reactions  . Food     MANGO  . Prednisone     REACTION: hives, difficulty breathing, facial swelling.   Marland Kitchen Prevnar 13 [Pneumococcal 13-Val Conj Vacc]     Family History  Problem Relation Age of Onset  . Heart disease Mother   . Cancer Father     lung/chest wall   . Diabetes Brother     siblings  . Hypertension Sister     x 2  . Heart disease Brother   . Breast cancer Paternal Grandmother   . Heart disease Sister   . Diabetes  Sister   . Tuberculosis Sister   . Prostate cancer Father   . Cirrhosis Brother     Social History   Social History  . Marital Status: Married    Spouse Name: N/A  . Number of Children: 1  . Years of Education: N/A   Occupational History  . [employer] as a Forensic psychologist     retired   Social History Main Topics  . Smoking status: Former Smoker -- 1.50 packs/day for 40 years    Types: Cigarettes    Quit date: 02/11/2009  . Smokeless tobacco: Never Used  . Alcohol Use: No  . Drug Use: No  . Sexual Activity: Not on file   Other Topics Concern  . Not on file   Social History Narrative   Lives in [place] with her husband.  Takes care of her two grandchildren, ages 32 and 2, everyday in [place].  Has one son who is 67.   Desires CPR   Would not want prolonged life support if futile     Constitutional: Denies fever, malaise, fatigue, headache or abrupt weight changes.  HEENT: Denies eye pain, eye redness, ear pain, ringing in the ears, wax buildup, runny  nose, nasal congestion, bloody nose, or sore throat. Skin: Pt reports rash. Denies redness, lesions or ulcercations.   No other specific complaints in a complete review of systems (except as listed in HPI above).  Objective:   Physical Exam  BP 140/74 mmHg  Pulse 78  Temp(Src) 99.1 F (37.3 C) (Oral)  Wt 179 lb (81.194 kg)  SpO2 95% Wt Readings from Last 3 Encounters:  06/16/15 179 lb (81.194 kg)  05/20/15 181 lb 6.4 oz (82.283 kg)  04/05/15 183 lb 6.4 oz (83.19 kg)    General: Appears her stated age, obese in NAD. Skin: Warm, dry and intact. Scattered, vesicular lesions noted on bilateral upper eyelids, right chin and left cheek- resembles poison ivy.  BMET    Component Value Date/Time   NA 137 10/05/2014 1056   K 4.3 10/05/2014 1056   CL 101 10/05/2014 1056   CO2 26 10/05/2014 1056   GLUCOSE 97 10/05/2014 1056   BUN 10 10/05/2014 1056   CREATININE 0.67 10/05/2014 1056   CALCIUM 9.6 10/05/2014  1056   GFRNONAA >60 03/28/2008 2301   GFRAA  03/28/2008 2301    >60        The eGFR has been calculated using the MDRD equation. This calculation has not been validated in all clinical situations. eGFR's persistently <60 mL/min signify possible Chronic Kidney Disease.    Lipid Panel     Component Value Date/Time   CHOL 132 10/05/2014 1056   TRIG 94.0 10/05/2014 1056   HDL 43.20 10/05/2014 1056   CHOLHDL 3 10/05/2014 1056   VLDL 18.8 10/05/2014 1056   LDLCALC 70 10/05/2014 1056    CBC    Component Value Date/Time   WBC 10.4 10/05/2014 1056   RBC 5.09 10/05/2014 1056   HGB 14.9 10/05/2014 1056   HCT 45.5 10/05/2014 1056   PLT 338.0 10/05/2014 1056   MCV 89.4 10/05/2014 1056   MCHC 32.7 10/05/2014 1056   RDW 14.7 10/05/2014 1056   LYMPHSABS 3.0 06/17/2012 0923   MONOABS 1.0 06/17/2012 0923   EOSABS 0.1 06/17/2012 0923   BASOSABS 0.1 06/17/2012 0923    Hgb A1C Lab Results  Component Value Date   HGBA1C 6.0 12/30/2012         Assessment & Plan:   Poison ivy dermatitis:  80 mg Depo IM today eRx for Triamcinolone 0.1% cream- avoid use on eyelids Benadryl 25 mg as needed- use as directed  RTC as needed or if symptoms persist or worsen Shulamit Donofrio, NP

## 2015-06-17 ENCOUNTER — Ambulatory Visit: Payer: Medicare Other | Admitting: Primary Care

## 2015-06-20 MED ORDER — METHYLPREDNISOLONE ACETATE 80 MG/ML IJ SUSP
80.0000 mg | Freq: Once | INTRAMUSCULAR | Status: AC
  Administered 2015-06-16: 80 mg via INTRAMUSCULAR

## 2015-06-20 NOTE — Addendum Note (Signed)
Addended by: Lurlean Nanny on: 06/20/2015 01:31 PM   Modules accepted: Orders

## 2015-06-23 ENCOUNTER — Encounter: Payer: Self-pay | Admitting: Internal Medicine

## 2015-06-23 ENCOUNTER — Ambulatory Visit (INDEPENDENT_AMBULATORY_CARE_PROVIDER_SITE_OTHER): Payer: Medicare Other | Admitting: Internal Medicine

## 2015-06-23 VITALS — BP 142/76 | HR 79 | Temp 98.2°F | Wt 179.0 lb

## 2015-06-23 DIAGNOSIS — L237 Allergic contact dermatitis due to plants, except food: Secondary | ICD-10-CM | POA: Diagnosis not present

## 2015-06-23 NOTE — Patient Instructions (Signed)

## 2015-06-23 NOTE — Progress Notes (Signed)
Subjective:    Patient ID: Teresa Jensen, female    DOB: [birth year], 70 y.o.   MRN: [id number]  HPI  Pt presents to the clinic today to follow up rash on her face. She was seen 6/15 for the same, diagnosed with poison ivy. She was given a Depo Medrol injection, RX for Triamcinolone cream, and advised to use Benadryl as needed. She reports the rash has improved but not cleared up completely. It still itches. She wants to have it rechecked to make sure it has not gotten worse or does not need any other treatment.   Review of Systems      Past Medical History  Diagnosis Date  . Anxiety   . COPD (chronic obstructive pulmonary disease) ([facility])   . GERD (gastroesophageal reflux disease)   . Tobacco abuse   . Osteopenia   . Strain of chest wall 01/20/2011  . PMR (polymyalgia rheumatica) ([facility])   . New onset of headaches     Current Outpatient Prescriptions  Medication Sig Dispense Refill  . albuterol (PROVENTIL HFA;VENTOLIN HFA) 108 (90 Base) MCG/ACT inhaler Inhale 2 puffs into the lungs every 6 (six) hours as needed for wheezing or shortness of breath. 1 Inhaler 1  . aspirin 81 MG tablet Take 81 mg by mouth every other day.     . Calcium Citrate-Vitamin D (GNP CALCIUM CITRATE +D3 PO) Take 1 tablet by mouth daily.    Marland Kitchen levothyroxine (SYNTHROID, LEVOTHROID) 75 MCG tablet TAKE 1 TABLET (75 MCG TOTAL) BY MOUTH DAILY. 90 tablet 3  . omeprazole (PRILOSEC) 40 MG capsule Take 40 mg by mouth daily.  12  . triamcinolone cream (KENALOG) 0.1 % Apply 1 application topically 2 (two) times daily. 30 g 0   No current facility-administered medications for this visit.    Allergies  Allergen Reactions  . Food     MANGO  . Prednisone     REACTION: hives, difficulty breathing, facial swelling.   Marland Kitchen Prevnar 13 [Pneumococcal 13-Val Conj Vacc]     Family History  Problem Relation Age of Onset  . Heart disease Mother   . Cancer Father     lung/chest wall   . Diabetes Brother     siblings  .  Hypertension Sister     x 2  . Heart disease Brother   . Breast cancer Paternal Grandmother   . Heart disease Sister   . Diabetes Sister   . Tuberculosis Sister   . Prostate cancer Father   . Cirrhosis Brother     Social History   Social History  . Marital Status: Married    Spouse Name: N/A  . Number of Children: 1  . Years of Education: N/A   Occupational History  . [employer] as a Forensic psychologist     retired   Social History Main Topics  . Smoking status: Former Smoker -- 1.50 packs/day for 40 years    Types: Cigarettes    Quit date: 02/11/2009  . Smokeless tobacco: Never Used  . Alcohol Use: No  . Drug Use: No  . Sexual Activity: Not on file   Other Topics Concern  . Not on file   Social History Narrative   Lives in [place] with her husband.  Takes care of her two grandchildren, ages 59 and 2, everyday in [place].  Has one son who is 28.   Desires CPR   Would not want prolonged life support if futile     Constitutional: Denies fever,  malaise, fatigue, headache or abrupt weight changes.  HEENT: Denies eye pain, eye redness, ear pain, ringing in the ears, wax buildup, runny nose, nasal congestion, bloody nose, or sore throat. Skin: Pt reports rash. Denies redness, lesions or ulcercations.   No other specific complaints in a complete review of systems (except as listed in HPI above).  Objective:   Physical Exam  BP 142/76 mmHg  Pulse 79  Temp(Src) 98.2 F (36.8 C) (Oral)  Wt 179 lb (81.194 kg)  SpO2 95%  Wt Readings from Last 3 Encounters:  06/16/15 179 lb (81.194 kg)  05/20/15 181 lb 6.4 oz (82.283 kg)  04/05/15 183 lb 6.4 oz (83.19 kg)    General: Appears her stated age, obese in NAD. Skin: Warm, dry and intact. Scattered, scabbed lesions noted on bilateral upper eyelids, right chin and left cheek- resembles poison ivy. It does appear improved from last time I evaluated her.  BMET    Component Value Date/Time   NA 137 10/05/2014 1056    K 4.3 10/05/2014 1056   CL 101 10/05/2014 1056   CO2 26 10/05/2014 1056   GLUCOSE 97 10/05/2014 1056   BUN 10 10/05/2014 1056   CREATININE 0.67 10/05/2014 1056   CALCIUM 9.6 10/05/2014 1056   GFRNONAA >60 03/28/2008 2301   GFRAA  03/28/2008 2301    >60        The eGFR has been calculated using the MDRD equation. This calculation has not been validated in all clinical situations. eGFR's persistently <60 mL/min signify possible Chronic Kidney Disease.    Lipid Panel     Component Value Date/Time   CHOL 132 10/05/2014 1056   TRIG 94.0 10/05/2014 1056   HDL 43.20 10/05/2014 1056   CHOLHDL 3 10/05/2014 1056   VLDL 18.8 10/05/2014 1056   LDLCALC 70 10/05/2014 1056    CBC    Component Value Date/Time   WBC 10.4 10/05/2014 1056   RBC 5.09 10/05/2014 1056   HGB 14.9 10/05/2014 1056   HCT 45.5 10/05/2014 1056   PLT 338.0 10/05/2014 1056   MCV 89.4 10/05/2014 1056   MCHC 32.7 10/05/2014 1056   RDW 14.7 10/05/2014 1056   LYMPHSABS 3.0 06/17/2012 0923   MONOABS 1.0 06/17/2012 0923   EOSABS 0.1 06/17/2012 0923   BASOSABS 0.1 06/17/2012 0923    Hgb A1C Lab Results  Component Value Date   HGBA1C 6.0 12/30/2012         Assessment & Plan:   Poison ivy dermatitis:  Cool compresses as needed Continue Triamcinolone 0.1% cream- avoid use on eyelids Benadryl 25 mg as needed- use as directed Advised her this normally takes 3-4 weeks to heal up completely  RTC as needed or if symptoms persist or worsen Danyle Boening, NP

## 2015-06-23 NOTE — Progress Notes (Signed)
Pre visit review using our clinic review tool, if applicable. No additional management support is needed unless otherwise documented below in the visit note. 

## 2015-07-01 ENCOUNTER — Ambulatory Visit
Admission: RE | Admit: 2015-07-01 | Discharge: 2015-07-01 | Disposition: A | Discharge status: Home / Self Care | Payer: Medicare Other | Source: Ambulatory Visit | Attending: Primary Care | Admitting: Primary Care

## 2015-07-01 ENCOUNTER — Telehealth: Payer: Self-pay | Admitting: Primary Care

## 2015-07-01 ENCOUNTER — Other Ambulatory Visit (HOSPITAL_COMMUNITY): Payer: Self-pay

## 2015-07-01 ENCOUNTER — Other Ambulatory Visit: Payer: Self-pay

## 2015-07-01 DIAGNOSIS — Z1231 Encounter for screening mammogram for malignant neoplasm of breast: Secondary | ICD-10-CM

## 2015-07-01 DIAGNOSIS — E2839 Other primary ovarian failure: Secondary | ICD-10-CM

## 2015-07-01 NOTE — Telephone Encounter (Signed)
Spoken and notified patient of Kate's comments. Patient verbalized understanding. 

## 2015-07-01 NOTE — Telephone Encounter (Signed)
Patient returned Chan's call. °

## 2015-07-08 ENCOUNTER — Ambulatory Visit: Payer: Medicare Other | Admitting: Primary Care

## 2015-07-08 ENCOUNTER — Ambulatory Visit (INDEPENDENT_AMBULATORY_CARE_PROVIDER_SITE_OTHER): Payer: Medicare Other | Admitting: Primary Care

## 2015-07-08 ENCOUNTER — Encounter: Payer: Self-pay | Admitting: Primary Care

## 2015-07-08 VITALS — BP 138/76 | HR 81 | Temp 97.6°F | Ht 59.0 in | Wt 179.0 lb

## 2015-07-08 DIAGNOSIS — B029 Zoster without complications: Secondary | ICD-10-CM | POA: Diagnosis not present

## 2015-07-08 DIAGNOSIS — R21 Rash and other nonspecific skin eruption: Secondary | ICD-10-CM

## 2015-07-08 DIAGNOSIS — L237 Allergic contact dermatitis due to plants, except food: Secondary | ICD-10-CM

## 2015-07-08 HISTORY — DX: Zoster without complications: B02.9

## 2015-07-08 MED ORDER — TRIAMCINOLONE ACETONIDE 0.1 % EX CREA: 1.0000 "application " | TOPICAL_CREAM | Freq: Two times a day (BID) | CUTANEOUS | Status: DC

## 2015-07-08 MED ORDER — VALACYCLOVIR HCL 1 G PO TABS: 1000.0000 mg | ORAL_TABLET | Freq: Three times a day (TID) | ORAL | Status: DC

## 2015-07-08 NOTE — Patient Instructions (Signed)
Start Valtrex tablets for presumed shingles. Take 1 tablet by mouth three times daily for 7 days.   You may apply the triamcinolone cream twice daily for any itching.  Please notify me if you develop pain to the site of your rash. Please also notify me if no improvement in your rash in 1-2 weeks.   It was a pleasure to see you today!

## 2015-07-08 NOTE — Progress Notes (Signed)
Pre visit review using our clinic review tool, if applicable. No additional management support is needed unless otherwise documented below in the visit note. 

## 2015-07-08 NOTE — Assessment & Plan Note (Signed)
Rash very representative of herpes zoster. Follows dermatome. Atypical symptoms, but classic presentation. Will treat with 7 day course of Valtrex. Use triamcinolone cream PRN itching. Discussed to call if she develops pain, and if no improvement in 1 week. Discussed that she will be contagious if vesicles open, she verbalized understanding.

## 2015-07-08 NOTE — Progress Notes (Signed)
Subjective:    Patient ID: Teresa Jensen, female    DOB: 15-Feb-1945, 70 y.o.   MRN: [id number]  HPI  Teresa Jensen is a 70 year old female who presents today with a chief complaint of rash. Her rash is located to her right posterior thoracic trunk and has since spread around to her right lateral chest to her right anterior breast.   She first noticed her rash on Tuesday this week with gradual spread and increase in redness and size. She has been in her yard recently but has been wearing gloves and does not recall an encounter with poison ivy/oak. She was treated for poison ivy dermatitis in June, this has since resolved and does not feel similar.  She has noticed slight discomfort to the right lateral chest, but denies pain overall, fevers, chills,numbness/tingling, changes in soaps/detergents/food. She's noticed slight itching to her posterior right trunk. She's applied triamcinolone cream and taken benadryl without improvement. She's never had a shingles vaccination as her insurance company will not cover.   Review of Systems  Constitutional: Negative for fever and chills.  Respiratory: Negative for cough.   Musculoskeletal: Negative for arthralgias.  Skin: Positive for rash.  Allergic/Immunologic: Negative for environmental allergies.  Neurological: Negative for numbness.       Past Medical History  Diagnosis Date  . Anxiety   . COPD (chronic obstructive pulmonary disease) ([place])   . GERD (gastroesophageal reflux disease)   . Tobacco abuse   . Osteopenia   . Strain of chest wall 01/20/2011  . PMR (polymyalgia rheumatica) ([facility])   . New onset of headaches      Social History   Social History  . Marital Status: Married    Spouse Name: N/A  . Number of Children: 1  . Years of Education: N/A   Occupational History  . [employer] as a Forensic psychologist     retired   Social History Main Topics  . Smoking status: Former Smoker -- 1.50 packs/day for 40 years    Types:  Cigarettes    Quit date: 02/11/2009  . Smokeless tobacco: Never Used  . Alcohol Use: No  . Drug Use: No  . Sexual Activity: Not on file   Other Topics Concern  . Not on file   Social History Narrative   Lives in [place] with her husband.  Takes care of her two grandchildren, ages 26 and 2, everyday in [place].  Has one son who is 73.   Desires CPR   Would not want prolonged life support if futile    Past Surgical History  Procedure Laterality Date  . Abdominal hysterectomy    . Lumbar fusion    . Appendectomy    . Cholecystectomy    . Hemorrhoid surgery    . Tubal ligation    . Cervical fusion      Family History  Problem Relation Age of Onset  . Heart disease Mother   . Cancer Father     lung/chest wall   . Diabetes Brother     siblings  . Hypertension Sister     x 2  . Heart disease Brother   . Breast cancer Paternal Grandmother   . Heart disease Sister   . Diabetes Sister   . Tuberculosis Sister   . Prostate cancer Father   . Cirrhosis Brother     Allergies  Allergen Reactions  . Food     MANGO  . Prednisone     REACTION: hives,  difficulty breathing, facial swelling.   Marland Kitchen Prevnar 13 [Pneumococcal 13-Val Conj Vacc]     Current Outpatient Prescriptions on File Prior to Visit  Medication Sig Dispense Refill  . albuterol (PROVENTIL HFA;VENTOLIN HFA) 108 (90 Base) MCG/ACT inhaler Inhale 2 puffs into the lungs every 6 (six) hours as needed for wheezing or shortness of breath. 1 Inhaler 1  . aspirin 81 MG tablet Take 81 mg by mouth every other day.     . Calcium Citrate-Vitamin D (GNP CALCIUM CITRATE +D3 PO) Take 1 tablet by mouth daily.    Marland Kitchen levothyroxine (SYNTHROID, LEVOTHROID) 75 MCG tablet TAKE 1 TABLET (75 MCG TOTAL) BY MOUTH DAILY. 90 tablet 3  . omeprazole (PRILOSEC) 40 MG capsule Take 40 mg by mouth daily.  12   No current facility-administered medications on file prior to visit.    BP 138/76 mmHg  Pulse 81  Temp(Src) 97.6 F (36.4 C) (Oral)   Ht '4\' 11"'$  (1.499 m)  Wt 179 lb (81.194 kg)  BMI 36.13 kg/m2  SpO2 95%    Objective:   Physical Exam  Constitutional: She appears well-nourished.  Neck: Neck supple.  Cardiovascular: Normal rate and regular rhythm.   Pulmonary/Chest: Effort normal and breath sounds normal. She has no wheezes.  Skin: Skin is warm and dry.  Moderate erythema with closed vesicles to right posterior trunk wrapping around to right lateral chest and right anterior breast. Representative of herpes zoster.          Assessment & Plan:

## 2015-07-13 ENCOUNTER — Ambulatory Visit: Payer: Medicare Other

## 2015-08-04 ENCOUNTER — Ambulatory Visit
Admission: RE | Admit: 2015-08-04 | Discharge: 2015-08-04 | Disposition: A | Discharge status: Home / Self Care | Payer: Medicare Other | Source: Ambulatory Visit | Attending: Family Medicine | Admitting: Family Medicine

## 2015-08-04 DIAGNOSIS — Z1231 Encounter for screening mammogram for malignant neoplasm of breast: Secondary | ICD-10-CM

## 2015-08-11 ENCOUNTER — Ambulatory Visit (INDEPENDENT_AMBULATORY_CARE_PROVIDER_SITE_OTHER): Payer: Medicare Other | Admitting: Primary Care

## 2015-08-11 ENCOUNTER — Encounter: Payer: Self-pay | Admitting: Primary Care

## 2015-08-11 VITALS — BP 136/76 | HR 80 | Temp 98.0°F | Ht 59.0 in | Wt 178.1 lb

## 2015-08-11 DIAGNOSIS — R51 Headache: Secondary | ICD-10-CM

## 2015-08-11 DIAGNOSIS — R519 Headache, unspecified: Secondary | ICD-10-CM

## 2015-08-11 MED ORDER — KETOROLAC TROMETHAMINE 60 MG/2ML IM SOLN
60.0000 mg | Freq: Once | INTRAMUSCULAR | Status: AC
  Administered 2015-08-11: 60 mg via INTRAMUSCULAR

## 2015-08-11 NOTE — Patient Instructions (Addendum)
You were provided with an injection of Toradol for pain and inflammation.  Tonight before bed take 600 mg of ibuprofen, but do not take any ibuprofen prior to tonight.  You may take ibuprofen 600 mg three times daily as needed for headache for the next 3 days.   Please call me if no improvement in your headache by tomorrow afternoon.  It was a pleasure to see you today!

## 2015-08-11 NOTE — Progress Notes (Signed)
Pre visit review using our clinic review tool, if applicable. No additional management support is needed unless otherwise documented below in the visit note. 

## 2015-08-11 NOTE — Progress Notes (Signed)
Subjective:    Patient ID: Teresa Jensen, female    DOB: May 20, 1945, 70 y.o.   MRN: [id number]  HPI  Teresa Jensen is a 70 year old female who presents today with a chief complaint of headache. Her headache is located to the right occipital region of her head with radiation to the right frontal lobe and behind her right eye. She describes her pain as dull with a sharp pain and is intermittent. Her headache began 2 days ago.   She's taken tylenol with mild temporary improvement. Denies nasal congestion, cough, fevers, increased stress, photophobia, nausea, weakness, and does not stare at a computer screen. She will sometimes experience numbness to the right temporal region occasionally at night which will pass after getting out of bed.   Review of Systems  Constitutional: Negative for fatigue and fever.  HENT: Negative for congestion and sinus pressure.   Neurological: Positive for headaches. Negative for dizziness, weakness and numbness.       Past Medical History:  Diagnosis Date  . Anxiety   . COPD (chronic obstructive pulmonary disease) ([place])   . GERD (gastroesophageal reflux disease)   . New onset of headaches   . Osteopenia   . PMR (polymyalgia rheumatica) ([facility])   . Strain of chest wall 01/20/2011  . Tobacco abuse      Social History   Social History  . Marital status: Married    Spouse name: N/A  . Number of children: 1  . Years of education: N/A   Occupational History  . [employer] as a Forensic psychologist Retired    retired   Social History Main Topics  . Smoking status: Former Smoker    Packs/day: 1.50    Years: 40.00    Types: Cigarettes    Quit date: 02/11/2009  . Smokeless tobacco: Never Used  . Alcohol use No  . Drug use: No  . Sexual activity: Not on file   Other Topics Concern  . Not on file   Social History Narrative   Lives in [place] with her husband.  Takes care of her two grandchildren, ages 69 and 2, everyday in [place].  Has one son who  is 27.   Desires CPR   Would not want prolonged life support if futile    Past Surgical History:  Procedure Laterality Date  . ABDOMINAL HYSTERECTOMY    . APPENDECTOMY    . CERVICAL FUSION    . CHOLECYSTECTOMY    . HEMORRHOID SURGERY    . LUMBAR FUSION    . TUBAL LIGATION      Family History  Problem Relation Age of Onset  . Heart disease Mother   . Cancer Father     lung/chest wall   . Diabetes Brother     siblings  . Hypertension Sister     x 2  . Heart disease Brother   . Breast cancer Paternal Grandmother   . Heart disease Sister   . Diabetes Sister   . Tuberculosis Sister   . Prostate cancer Father   . Cirrhosis Brother     Allergies  Allergen Reactions  . Food     MANGO  . Prednisone     REACTION: hives, difficulty breathing, facial swelling.   Marland Kitchen Prevnar 13 [Pneumococcal 13-Val Conj Vacc]     Current Outpatient Prescriptions on File Prior to Visit  Medication Sig Dispense Refill  . albuterol (PROVENTIL HFA;VENTOLIN HFA) 108 (90 Base) MCG/ACT inhaler Inhale 2 puffs into the lungs  every 6 (six) hours as needed for wheezing or shortness of breath. 1 Inhaler 1  . aspirin 81 MG tablet Take 81 mg by mouth every other day.     . Calcium Citrate-Vitamin D (GNP CALCIUM CITRATE +D3 PO) Take 1 tablet by mouth daily.    Marland Kitchen levothyroxine (SYNTHROID, LEVOTHROID) 75 MCG tablet TAKE 1 TABLET (75 MCG TOTAL) BY MOUTH DAILY. 90 tablet 3  . omeprazole (PRILOSEC) 40 MG capsule Take 40 mg by mouth daily.  12   No current facility-administered medications on file prior to visit.     BP 136/76   Pulse 80   Temp 98 F (36.7 C) (Oral)   Ht '4\' 11"'$  (1.499 m)   Wt 178 lb 1.9 oz (80.8 kg)   SpO2 98%   BMI 35.98 kg/m    Objective:   Physical Exam  Constitutional: She is oriented to person, place, and time. She appears well-nourished. She does not appear ill.  HENT:  Mouth/Throat: Oropharynx is clear and moist.  Eyes: Conjunctivae and EOM are normal. Pupils are equal,  round, and reactive to light.  Neck: Neck supple.  Cardiovascular: Normal rate and regular rhythm.   Pulmonary/Chest: Effort normal and breath sounds normal.  Musculoskeletal:  Grips equal bilaterally  Neurological: She is alert and oriented to person, place, and time. No cranial nerve deficit.  Skin: Skin is warm and dry.          Assessment & Plan:  Cluster headache,  Mostly located behind right eye, but also to right parietal and occipital lobe. Some improvement with Tylenol. Do not suspect acute TIA or stroke as her neuro exam is unremarkable and she denies dizziness, weakness, and numbness or tingling at this time. Will treat with IM Toradol. Renal function within normal limits. Discussed ibuprofen use at home starting tonight at bedtime and continuing to times daily as needed for the next 2-3 days. She will update [place] if no improvement in her headaches.  Sheral Flow, NP

## 2015-09-13 ENCOUNTER — Other Ambulatory Visit: Payer: Self-pay | Admitting: Primary Care

## 2015-09-14 NOTE — Telephone Encounter (Signed)
Ok to refill? Electronically refill request for omeprazole (PRILOSEC) 40 MG capsule   Medication have not been prescribed by Anda Kraft. Last seen on 08/11/2015.

## 2015-09-16 NOTE — Telephone Encounter (Signed)
Spoken to patient stated that she does not take them every often and it is only when she has a flare up. Notified patient of Kate's comments. Patient stated that she would like to discuss this with Anda Kraft on the follow up in October.

## 2015-10-07 ENCOUNTER — Other Ambulatory Visit: Payer: Self-pay | Admitting: Primary Care

## 2015-10-07 DIAGNOSIS — R7303 Prediabetes: Secondary | ICD-10-CM

## 2015-10-07 DIAGNOSIS — E039 Hypothyroidism, unspecified: Secondary | ICD-10-CM

## 2015-10-07 DIAGNOSIS — E6609 Other obesity due to excess calories: Secondary | ICD-10-CM

## 2015-10-07 DIAGNOSIS — E559 Vitamin D deficiency, unspecified: Secondary | ICD-10-CM

## 2015-10-12 ENCOUNTER — Telehealth: Payer: Self-pay

## 2015-10-12 DIAGNOSIS — Z1159 Encounter for screening for other viral diseases: Secondary | ICD-10-CM

## 2015-10-12 NOTE — Telephone Encounter (Signed)
Adding Hep C to labs

## 2015-10-12 NOTE — Telephone Encounter (Signed)
Noted and agree. 

## 2015-10-13 ENCOUNTER — Ambulatory Visit (INDEPENDENT_AMBULATORY_CARE_PROVIDER_SITE_OTHER): Payer: Medicare Other

## 2015-10-13 ENCOUNTER — Other Ambulatory Visit (INDEPENDENT_AMBULATORY_CARE_PROVIDER_SITE_OTHER): Payer: Medicare Other

## 2015-10-13 VITALS — BP 132/70 | HR 80 | Temp 98.1°F | Ht 58.75 in | Wt 181.5 lb

## 2015-10-13 DIAGNOSIS — Z23 Encounter for immunization: Secondary | ICD-10-CM

## 2015-10-13 DIAGNOSIS — E6609 Other obesity due to excess calories: Secondary | ICD-10-CM | POA: Diagnosis not present

## 2015-10-13 DIAGNOSIS — R7303 Prediabetes: Secondary | ICD-10-CM | POA: Diagnosis not present

## 2015-10-13 DIAGNOSIS — Z0000 Encounter for general adult medical examination without abnormal findings: Secondary | ICD-10-CM | POA: Diagnosis not present

## 2015-10-13 DIAGNOSIS — E559 Vitamin D deficiency, unspecified: Secondary | ICD-10-CM | POA: Diagnosis not present

## 2015-10-13 DIAGNOSIS — E039 Hypothyroidism, unspecified: Secondary | ICD-10-CM

## 2015-10-13 DIAGNOSIS — Z1159 Encounter for screening for other viral diseases: Secondary | ICD-10-CM

## 2015-10-13 LAB — LIPID PANEL
Cholesterol: 130 mg/dL (ref 0–200)
HDL: 45 mg/dL (ref >39.00)
LDL Cholesterol: 69 mg/dL (ref 0–99)
NonHDL: 84.5
Total CHOL/HDL Ratio: 3
Triglycerides: 79 mg/dL (ref 0.0–149.0)
VLDL: 15.8 mg/dL (ref 0.0–40.0)

## 2015-10-13 LAB — COMPREHENSIVE METABOLIC PANEL
ALT: 22 U/L (ref 0–35)
AST: 24 U/L (ref 0–37)
Albumin: 3.8 g/dL (ref 3.5–5.2)
Alkaline Phosphatase: 71 U/L (ref 39–117)
BUN: 9 mg/dL (ref 6–23)
CO2: 29 mEq/L (ref 19–32)
Calcium: 9.2 mg/dL (ref 8.4–10.5)
Chloride: 101 mEq/L (ref 96–112)
Creatinine, Ser: 0.67 mg/dL (ref 0.40–1.20)
GFR: 92.33 mL/min (ref >60.00)
Glucose, Bld: 126 mg/dL — ABNORMAL HIGH (ref 70–99)
Potassium: 4.3 mEq/L (ref 3.5–5.1)
Sodium: 137 mEq/L (ref 135–145)
Total Bilirubin: 0.4 mg/dL (ref 0.2–1.2)
Total Protein: 7.8 g/dL (ref 6.0–8.3)

## 2015-10-13 LAB — VITAMIN D 25 HYDROXY (VIT D DEFICIENCY, FRACTURES): VITD: 19.36 ng/mL — ABNORMAL LOW (ref 30.00–100.00)

## 2015-10-13 LAB — HEMOGLOBIN A1C: Hgb A1c MFr Bld: 5.7 % (ref 4.6–6.5)

## 2015-10-13 LAB — TSH: TSH: 2.06 u[IU]/mL (ref 0.35–4.50)

## 2015-10-13 NOTE — Progress Notes (Signed)
Subjective:   Teresa Jensen is a 70 y.o. female who presents for Medicare Annual (Subsequent) preventive examination.  Review of Systems:  N/A Cardiac Risk Factors include: advanced age (>63mn, >>5women);obesity (BMI >30kg/m2)     Objective:     Vitals: BP 132/70 (BP Location: Right Arm, Patient Position: Sitting, Cuff Size: Normal)   Pulse 80   Temp 98.1 F (36.7 C) (Oral)   Ht 4' 10.75" (1.492 m) Comment: no shoes  Wt 181 lb 8 oz (82.3 kg)   SpO2 93%   BMI 36.97 kg/m   Body mass index is 36.97 kg/m.   Tobacco History  Smoking Status  . Former Smoker  . Packs/day: 1.50  . Years: 40.00  . Types: Cigarettes  . Quit date: 02/11/2009  Smokeless Tobacco  . Never Used     Counseling given: No   Past Medical History:  Diagnosis Date  . Anxiety   . COPD (chronic obstructive pulmonary disease) ([place]   . GERD (gastroesophageal reflux disease)   . New onset of headaches   . Osteopenia   . PMR (polymyalgia rheumatica) ([facility])   . Strain of chest wall 01/20/2011  . Tobacco abuse    Past Surgical History:  Procedure Laterality Date  . ABDOMINAL HYSTERECTOMY    . APPENDECTOMY    . CERVICAL FUSION    . CHOLECYSTECTOMY    . HEMORRHOID SURGERY    . LUMBAR FUSION    . TUBAL LIGATION     Family History  Problem Relation Age of Onset  . Heart disease Mother   . Cancer Father     lung/chest wall   . Prostate cancer Father   . Diabetes Brother     siblings  . Hypertension Sister     x 2  . Heart disease Brother   . Breast cancer Paternal Grandmother   . Heart disease Sister   . Diabetes Sister   . Tuberculosis Sister   . Cirrhosis Brother    History  Sexual Activity  . Sexual activity: No    Outpatient Encounter Prescriptions as of 10/13/2015  Medication Sig  . albuterol (PROVENTIL HFA;VENTOLIN HFA) 108 (90 Base) MCG/ACT inhaler Inhale 2 puffs into the lungs every 6 (six) hours as needed for wheezing or shortness of breath.  .Marland Kitchenaspirin 81 MG tablet  Take 81 mg by mouth every other day.   . Calcium Citrate-Vitamin D (GNP CALCIUM CITRATE +D3 PO) Take 1 tablet by mouth daily.  .Marland Kitchenlevothyroxine (SYNTHROID, LEVOTHROID) 75 MCG tablet TAKE 1 TABLET (75 MCG TOTAL) BY MOUTH DAILY.  .Marland Kitchenomeprazole (PRILOSEC) 40 MG capsule TAKE 1 CAPSULE BY MOUTH DAILY   No facility-administered encounter medications on file as of 10/13/2015.     Activities of Daily Living In your present state of health, do you have any difficulty performing the following activities: 10/13/2015  Hearing? N  Vision? N  Difficulty concentrating or making decisions? N  Walking or climbing stairs? Y  Dressing or bathing? N  Doing errands, shopping? N  Preparing Food and eating ? N  Using the Toilet? N  In the past six months, have you accidently leaked urine? N  Do you have problems with loss of bowel control? N  Managing your Medications? N  Managing your Finances? N  Housekeeping or managing your Housekeeping? N  Some recent data might be hidden    Patient Care Team: KPleas Koch NP as PCP - General (Internal Medicine) JMarchia Bond MD as Consulting Physician (  Orthopedic Surgery) Pieter Partridge, DO as Consulting Physician (Neurology) Devra Dopp, MD as Referring Physician (Dermatology) Almedia Balls, MD as Consulting Physician (Orthopedic Surgery) Luberta Mutter, MD as Consulting Physician (Ophthalmology)    Assessment:     Hearing Screening   '125Hz'$  '250Hz'$  '500Hz'$  '1000Hz'$  '2000Hz'$  '3000Hz'$  '4000Hz'$  '6000Hz'$  '8000Hz'$   Right ear:   40 40 40  40    Left ear:   40 40 40  40    Vision Screening Comments: Last vision exam in May 2017 with Dr. Ellie Lunch   Exercise Activities and Dietary recommendations Current Exercise Habits: Home exercise routine, Type of exercise: walking, Time (Minutes): 15, Frequency (Times/Week): 7, Weekly Exercise (Minutes/Week): 105, Intensity: Mild, Exercise limited by: None identified  Goals    . Increase physical activity          Starting  10/13/2015, I will continue to walk at least 15 min daily.       Fall Risk Fall Risk  10/13/2015 10/12/2014  Falls in the past year? No No   Depression Screen PHQ 2/9 Scores 10/13/2015 10/12/2014  PHQ - 2 Score 0 0     Cognitive Testing MMSE - Mini Mental State Exam 10/13/2015  Orientation to time 5  Orientation to Place 5  Registration 3  Attention/ Calculation 0  Recall 3  Language- name 2 objects 0  Language- repeat 1  Language- follow 3 step command 3  Language- read & follow direction 0  Write a sentence 0  Copy design 0  Total score 20  PLEASE NOTE: A Mini-Cog screen was completed. Maximum score is 20. A value of 0 denotes this part of [place] MMSE was not completed or the patient failed this part of the Mini-Cog screening.   Mini-Cog Screening Orientation to Time - Max 5 pts Orientation to Place - Max 5 pts Registration - Max 3 pts Recall - Max 3 pts Language Repeat - Max 1 pts Language Follow 3 Step Command - Max 3 pts   Immunization History  Administered Date(s) Administered  . Influenza Whole 10/01/2008  . Influenza,inj,Quad PF,36+ Mos 10/12/2014, 10/13/2015  . Pneumococcal Conjugate-13 10/12/2014  . Td 10/01/2008   Screening Tests Health Maintenance  Topic Date Due  . ZOSTAVAX  10/12/2025 (Originally 03/19/2005)  . MAMMOGRAM  08/03/2017  . TETANUS/TDAP  10/02/2018  . COLONOSCOPY  06/28/2021  . INFLUENZA VACCINE  Completed  . DEXA SCAN  Completed  . Hepatitis C Screening  Completed      Plan:     I have personally reviewed and addressed the Medicare Annual Wellness questionnaire and have noted the following in the patient's chart:  A. Medical and social history B. Use of alcohol, tobacco or illicit drugs  C. Current medications and supplements D. Functional ability and status E.  Nutritional status F.  Physical activity G. Advance directives H. List of other physicians I.  Hospitalizations, surgeries, and ER visits in previous 12  months J.  [place] to include hearing, vision, cognitive, depression L. Referrals and appointments - none  In addition, I have reviewed and discussed with patient certain preventive protocols, quality metrics, and best practice recommendations. A written personalized care plan for preventive services as well as general preventive health recommendations were provided to patient.  See attached scanned questionnaire for additional information.   Signed,   Lindell Noe, MHA, BS, LPN Health Coach

## 2015-10-13 NOTE — Progress Notes (Signed)
PCP notes:   Health maintenance:  Shingles - declined Hep C screening - completed Flu vaccine - administered  Abnormal screenings:   None  Patient concerns:   None  Nurse concerns:  None  Next PCP appt:   10/17/15 @ 0815

## 2015-10-13 NOTE — Progress Notes (Signed)
Pre visit review using our clinic review tool, if applicable. No additional management support is needed unless otherwise documented below in the visit note. 

## 2015-10-13 NOTE — Patient Instructions (Signed)
Ms. Maxham , Thank you for taking time to come for your Medicare Wellness Visit. I appreciate your ongoing commitment to your health goals. Please review the following plan we discussed and let me know if I can assist you in the future.   These are the goals we discussed: Goals    . Increase physical activity          Starting 10/13/2015, I will continue to walk at least 15 min daily.        This is a list of the screening recommended for you and due dates:  Health Maintenance  Topic Date Due  . Shingles Vaccine  10/12/2025*  . Mammogram  08/03/2017  . Tetanus Vaccine  10/02/2018  . Colon Cancer Screening  06/28/2021  . Flu Shot  Completed  . DEXA scan (bone density measurement)  Completed  .  Hepatitis C: One time screening is recommended by Center for Disease Control  (CDC) for  adults born from [birth year] through 1965.   Completed  *Topic was postponed. The date shown is not the original due date.   Preventive Care for Adults  A healthy lifestyle and preventive care can promote health and wellness. Preventive health guidelines for adults include the following key practices.  . A routine yearly physical is a good way to check with your health care provider about your health and preventive screening. It is a chance to share any concerns and updates on your health and to receive a thorough exam.  . Visit your dentist for a routine exam and preventive care every 6 months. Brush your teeth twice a day and floss once a day. Good oral hygiene prevents tooth decay and gum disease.  . The frequency of eye exams is based on your age, health, family medical history, use  of contact lenses, and other factors. Follow your health care provider's ecommendations for frequency of eye exams.  . Eat a healthy diet. Foods like vegetables, fruits, whole grains, low-fat dairy products, and lean protein foods contain the nutrients you need without too many calories. Decrease your intake of foods high in  solid fats, added sugars, and salt. Eat the right amount of calories for you. Get information about a proper diet from your health care provider, if necessary.  . Regular physical exercise is one of the most important things you can do for your health. Most adults should get at least 150 minutes of moderate-intensity exercise (any activity that increases your heart rate and causes you to sweat) each week. In addition, most adults need muscle-strengthening exercises on 2 or more days a week.  Silver Sneakers may be a benefit available to you. To determine eligibility, you may visit the website: www.silversneakers.com or contact program at (339) 333-4017 Mon-Fri between 8AM-8PM.   . Maintain a healthy weight. The body mass index (BMI) is a screening tool to identify possible weight problems. It provides an estimate of body fat based on height and weight. Your health care provider can find your BMI and can help you achieve or maintain a healthy weight.   For adults 20 years and older: ? A BMI below 18.5 is considered underweight. ? A BMI of 18.5 to 24.9 is normal. ? A BMI of 25 to 29.9 is considered overweight. ? A BMI of 30 and above is considered obese.   . Maintain normal blood lipids and cholesterol levels by exercising and minimizing your intake of saturated fat. Eat a balanced diet with plenty of fruit and vegetables. Blood  tests for lipids and cholesterol should begin at age 72 and be repeated every 5 years. If your lipid or cholesterol levels are high, you are over 50, or you are at high risk for heart disease, you may need your cholesterol levels checked more frequently. Ongoing high lipid and cholesterol levels should be treated with medicines if diet and exercise are not working.  . If you smoke, find out from your health care provider how to quit. If you do not use tobacco, please do not start.  . If you choose to drink alcohol, please do not consume more than 2 drinks per day. One drink  is considered to be 12 ounces (355 mL) of beer, 5 ounces (148 mL) of wine, or 1.5 ounces (44 mL) of liquor.  . If you are 46-32 years old, ask your health care provider if you should take aspirin to prevent strokes.  . Use sunscreen. Apply sunscreen liberally and repeatedly throughout the day. You should seek shade when your shadow is shorter than you. Protect yourself by wearing long sleeves, pants, a wide-brimmed hat, and sunglasses year round, whenever you are outdoors.  . Once a month, do a whole body skin exam, using a mirror to look at the skin on your back. Tell your health care provider of new moles, moles that have irregular borders, moles that are larger than a pencil eraser, or moles that have changed in shape or color.

## 2015-10-14 LAB — HEPATITIS C ANTIBODY: HCV Ab: NEGATIVE

## 2015-10-14 NOTE — Progress Notes (Signed)
I reviewed health advisor's note, was available for consultation, and agree with documentation and plan.  

## 2015-10-17 ENCOUNTER — Encounter: Payer: Self-pay | Admitting: Primary Care

## 2015-10-17 ENCOUNTER — Encounter: Payer: Self-pay | Admitting: *Deleted

## 2015-10-17 ENCOUNTER — Ambulatory Visit (INDEPENDENT_AMBULATORY_CARE_PROVIDER_SITE_OTHER): Payer: Medicare Other | Admitting: Primary Care

## 2015-10-17 VITALS — BP 126/82 | HR 93 | Temp 98.1°F | Ht 59.0 in | Wt 183.1 lb

## 2015-10-17 DIAGNOSIS — K219 Gastro-esophageal reflux disease without esophagitis: Secondary | ICD-10-CM

## 2015-10-17 DIAGNOSIS — J449 Chronic obstructive pulmonary disease, unspecified: Secondary | ICD-10-CM | POA: Diagnosis not present

## 2015-10-17 DIAGNOSIS — E038 Other specified hypothyroidism: Secondary | ICD-10-CM

## 2015-10-17 DIAGNOSIS — F411 Generalized anxiety disorder: Secondary | ICD-10-CM

## 2015-10-17 DIAGNOSIS — E559 Vitamin D deficiency, unspecified: Secondary | ICD-10-CM

## 2015-10-17 DIAGNOSIS — R7303 Prediabetes: Secondary | ICD-10-CM | POA: Insufficient documentation

## 2015-10-17 MED ORDER — ALPRAZOLAM 0.25 MG PO TABS: 0.2500 mg | ORAL_TABLET | Freq: Every day | ORAL | 0 refills | Status: DC | PRN

## 2015-10-17 NOTE — Assessment & Plan Note (Signed)
Uses omeprazole infrequently.  Continue current regimen.

## 2015-10-17 NOTE — Assessment & Plan Note (Signed)
Recent level of 19. Start vitamin D 1000 units once daily.

## 2015-10-17 NOTE — Assessment & Plan Note (Signed)
Level of 5.7 on recent labs, history of 6.0 in 2014. Information provided regarding prediabetes and discussed importance of healthy diet and regular exercise.

## 2015-10-17 NOTE — Patient Instructions (Signed)
Liver, kidneys, and electrolytes are all normal.  Vitamin D: Your Vitamin D level is too low which requires supplementation. I recommend supplementation of 1000 units of Vitamin D once daily. This may be purchased over the counter.  Prediabetes: Your blood sugar levels are too high. Prediabetes means that you do not have diabetes, but you are at risk for development of diabetes if you do not work to reduce your blood sugar levels. Decrease consumption of fast food, fried food, processed carbohydrates (chips, cookies, etc), sugary drinks, sweets. Increase consumption of fresh fruits and vegetables, whole grains, water. Exercise will also lower these levels.   Use the Xanax sparingly as discussed. Stop by the lab to sign the controlled substance contract and for your urine drug screen.  Follow up in 1 year for repeat exam or sooner if needed.  It was a pleasure to see you today!  Prediabetes Eating Plan Prediabetes--also called impaired glucose tolerance or impaired fasting glucose--is a condition that causes blood sugar (blood glucose) levels to be higher than normal. Following a healthy diet can help to keep prediabetes under control. It can also help to lower the risk of type 2 diabetes and heart disease, which are increased in people who have prediabetes. Along with regular exercise, a healthy diet:  Promotes weight loss.  Helps to control blood sugar levels.  Helps to improve the way that the body uses insulin. WHAT DO I NEED TO KNOW ABOUT THIS EATING PLAN?  Use the glycemic index (GI) to plan your meals. The index tells you how quickly a food will raise your blood sugar. Choose low-GI foods. These foods take a longer time to raise blood sugar.  Pay close attention to the amount of carbohydrates in the food that you eat. Carbohydrates increase blood sugar levels.  Keep track of how many calories you take in. Eating the right amount of calories will help you to achieve a healthy weight.  Losing about 7 percent of your starting weight can help to prevent type 2 diabetes.  You may want to follow a Mediterranean diet. This diet includes a lot of vegetables, lean meats or fish, whole grains, fruits, and healthy oils and fats. WHAT FOODS CAN I EAT? Grains Whole grains, such as whole-wheat or whole-grain breads, crackers, cereals, and pasta. Unsweetened oatmeal. Bulgur. Barley. Quinoa. Brown rice. Corn or whole-wheat flour tortillas or taco shells. Vegetables Lettuce. Spinach. Peas. Beets. Cauliflower. Cabbage. Broccoli. Carrots. Tomatoes. Squash. Eggplant. Herbs. Peppers. Onions. Cucumbers. Brussels sprouts. Fruits Berries. Bananas. Apples. Oranges. Grapes. Papaya. Mango. Pomegranate. Kiwi. Grapefruit. Cherries. Meats and Other Protein Sources Seafood. Lean meats, such as chicken and [place] or lean cuts of pork and beef. Tofu. Eggs. Nuts. Beans. Dairy Low-fat or fat-free dairy products, such as yogurt, cottage cheese, and cheese. Beverages Water. Tea. Coffee. Sugar-free or diet soda. Seltzer water. Milk. Milk alternatives, such as soy or almond milk. Condiments Mustard. Relish. Low-fat, low-sugar ketchup. Low-fat, low-sugar barbecue sauce. Low-fat or fat-free mayonnaise. Sweets and Desserts Sugar-free or low-fat pudding. Sugar-free or low-fat ice cream and other frozen treats. Fats and Oils Avocado. Walnuts. Olive oil. The items listed above may not be a complete list of recommended foods or beverages. Contact your dietitian for more options.  WHAT FOODS ARE NOT RECOMMENDED? Grains Refined white flour and flour products, such as bread, pasta, snack foods, and cereals. Beverages Sweetened drinks, such as sweet iced tea and soda. Sweets and Desserts Baked goods, such as cake, cupcakes, pastries, cookies, and cheesecake. The items listed  above may not be a complete list of foods and beverages to avoid. Contact your dietitian for more information.   This information is not  intended to replace advice given to you by your health care provider. Make sure you discuss any questions you have with your health care provider.   Document Released: 05/04/2014 Document Reviewed: 05/04/2014 Elsevier Interactive Patient Education [employer].

## 2015-10-17 NOTE — Progress Notes (Signed)
Pre visit review using our clinic review tool, if applicable. No additional management support is needed unless otherwise documented below in the visit note. 

## 2015-10-17 NOTE — Assessment & Plan Note (Signed)
Requests refill of Xanax, uses infrequently and appropriately. Discussed that if she found herself needing refills during this next year, then we will need to discuss other management for anxiety.  UDS and controlled substance obtained.

## 2015-10-17 NOTE — Assessment & Plan Note (Signed)
TSH stable. Continue levothyroxine 75 mcg.

## 2015-10-17 NOTE — Progress Notes (Signed)
Subjective:    Patient ID: Teresa Jensen, female    DOB: 08-18-1945, 70 y.o.   MRN: [id number]  HPI   Teresa Jensen is a 70 year old female who presents today for follow up from her [place]. She recently saw our Health Advisor last week for her [place]. She has no concerns from that visit. She had labs drawn and is here to discuss results.  1) Prediabetes: Recent A1C of 5.7 with fasting blood sugar of 126. She remembers someone notify her if a "sugar problem" in the past but does not ever recall being diabetic. She denies visual changes, numbness/tingling, polyuria.  She endorses a healthy diet which consists of: Breakfast: Grits, egg, pop tart Lunch: Skips, crackers Dinner: Grilled and baked meats, vegetables Snacks: None Desserts: Several times weekly Beverages: Water, diet soda  Exercise: She does not currently exercise.   2) Hypothyroidism: Currently managed on levothyroxine 75 mcg. Recent TSH of 2.06.  3) COPD: Overall using her albuterol inhaler once every 3 days. Denies daily wheezing, shortness of breath, cough. She feels well managed on albuterol. Denies chest pain, unexplained weight loss, hemoptysis.   4) Health Maintenance: She is up to date on her pneumonia, tetanus, and influenza vaccinations. She declines Zostavax as her insurance will not cover. She is up to date on her mammogram and colonoscopy.   5) Anxiety State: Previously received Xanax by prior PCP for which she used very sparingly. She experiences bouts of anxiety as she is the main caregiver for her husband. She denies depression, daily anxiety. Her prior Xanax Rx lasted for 1 year.   Review of Systems  Constitutional: Negative for unexpected weight change.  Eyes: Negative for visual disturbance.  Respiratory: Negative for shortness of breath and wheezing.   Cardiovascular: Negative for chest pain.  Endocrine: Negative for polyuria.  Psychiatric/Behavioral: The patient is nervous/anxious.        Past Medical  History:  Diagnosis Date  . Anxiety   . COPD (chronic obstructive pulmonary disease) ([place])   . GERD (gastroesophageal reflux disease)   . New onset of headaches   . Osteopenia   . PMR (polymyalgia rheumatica) ([facility])   . Strain of chest wall 01/20/2011  . Tobacco abuse      Social History   Social History  . Marital status: Married    Spouse name: N/A  . Number of children: 1  . Years of education: N/A   Occupational History  . [employer] as a Forensic psychologist Retired    retired   Social History Main Topics  . Smoking status: Former Smoker    Packs/day: 1.50    Years: 40.00    Types: Cigarettes    Quit date: 02/11/2009  . Smokeless tobacco: Never Used  . Alcohol use No  . Drug use: No  . Sexual activity: No   Other Topics Concern  . Not on file   Social History Narrative   Lives in [place] with her husband.  Takes care of her two grandchildren, ages 61 and 2, everyday in [place].  Has one son who is 49.   Desires CPR   Would not want prolonged life support if futile    Past Surgical History:  Procedure Laterality Date  . ABDOMINAL HYSTERECTOMY    . APPENDECTOMY    . CERVICAL FUSION    . CHOLECYSTECTOMY    . HEMORRHOID SURGERY    . LUMBAR FUSION    . TUBAL LIGATION      Family  History  Problem Relation Age of Onset  . Heart disease Mother   . Cancer Father     lung/chest wall   . Prostate cancer Father   . Diabetes Brother     siblings  . Hypertension Sister     x 2  . Heart disease Brother   . Breast cancer Paternal Grandmother   . Heart disease Sister   . Diabetes Sister   . Tuberculosis Sister   . Cirrhosis Brother     Allergies  Allergen Reactions  . Food     MANGO  . Prednisone     REACTION: hives, difficulty breathing, facial swelling.   Marland Kitchen Prevnar 13 [Pneumococcal 13-Val Conj Vacc]     Current Outpatient Prescriptions on File Prior to Visit  Medication Sig Dispense Refill  . albuterol (PROVENTIL HFA;VENTOLIN HFA) 108 (90 Base)  MCG/ACT inhaler Inhale 2 puffs into the lungs every 6 (six) hours as needed for wheezing or shortness of breath. 1 Inhaler 1  . aspirin 81 MG tablet Take 81 mg by mouth every other day.     . Calcium Citrate-Vitamin D (GNP CALCIUM CITRATE +D3 PO) Take 1 tablet by mouth daily.    Marland Kitchen levothyroxine (SYNTHROID, LEVOTHROID) 75 MCG tablet TAKE 1 TABLET (75 MCG TOTAL) BY MOUTH DAILY. 90 tablet 3  . omeprazole (PRILOSEC) 40 MG capsule TAKE 1 CAPSULE BY MOUTH DAILY 30 capsule 0   No current facility-administered medications on file prior to visit.     BP 126/82   Pulse 93   Temp 98.1 F (36.7 C) (Oral)   Ht '4\' 11"'$  (1.499 m)   Wt 183 lb 1.9 oz (83.1 kg)   SpO2 95%   BMI 36.99 kg/m    Objective:   Physical Exam  Constitutional: She is oriented to person, place, and time. She appears well-nourished.  HENT:  Right Ear: Tympanic membrane and ear canal normal.  Left Ear: Tympanic membrane and ear canal normal.  Nose: Nose normal.  Mouth/Throat: Oropharynx is clear and moist.  Eyes: Conjunctivae and EOM are normal. Pupils are equal, round, and reactive to light.  Neck: Neck supple. No thyromegaly present.  Cardiovascular: Normal rate and regular rhythm.   No murmur heard. Pulmonary/Chest: Effort normal. She has wheezes in the right upper field and the left upper field. She has no rales.  Abdominal: Soft. Bowel sounds are normal. There is no tenderness.  Musculoskeletal: Normal range of motion.  Lymphadenopathy:    She has no cervical adenopathy.  Neurological: She is alert and oriented to person, place, and time. She has normal reflexes. No cranial nerve deficit.  Skin: Skin is warm and dry. No rash noted.  Psychiatric: She has a normal mood and affect.          Assessment & Plan:

## 2015-10-17 NOTE — Assessment & Plan Note (Signed)
Stable on albuterol inhaler, using twice weekly on average. Mild wheezing on exam. Asymptomatic.

## 2015-12-22 ENCOUNTER — Other Ambulatory Visit: Payer: Self-pay | Admitting: Family Medicine

## 2016-02-01 ENCOUNTER — Ambulatory Visit (INDEPENDENT_AMBULATORY_CARE_PROVIDER_SITE_OTHER): Payer: Medicare Other | Admitting: Primary Care

## 2016-02-01 ENCOUNTER — Encounter: Payer: Self-pay | Admitting: Primary Care

## 2016-02-01 VITALS — BP 124/80 | HR 82 | Temp 98.4°F | Ht 59.0 in | Wt 179.0 lb

## 2016-02-01 DIAGNOSIS — T148XXA Other injury of unspecified body region, initial encounter: Secondary | ICD-10-CM | POA: Diagnosis not present

## 2016-02-01 NOTE — Progress Notes (Signed)
Pre visit review using our clinic review tool, if applicable. No additional management support is needed unless otherwise documented below in the visit note. 

## 2016-02-01 NOTE — Progress Notes (Signed)
Subjective:    Patient ID: Teresa Jensen, female    DOB: 12/11/1945, 71 y.o.   MRN: [id number]  HPI  Teresa Jensen is a 71 year old female who presents today with a chief complaint of lower extremity bruise. She has a bruise located to the left posterior calf and also to her right anterior lower extremity distal to her knee. She did notice a knot under the bruise to her left side. She first noticed the bruises 1 month ago, and she's not noticed any increased bruising. She denies calf swelling, erythema, calf pain, recent injury/trauma. She has no clotting disorders and is not managed on blood thinners.    Review of Systems  Constitutional: Negative for fatigue.  Respiratory: Negative for shortness of breath.   Cardiovascular: Negative for chest pain.  Musculoskeletal:       No calf pain  Skin: Negative for color change.       No swelling       Past Medical History:  Diagnosis Date  . Anxiety   . COPD (chronic obstructive pulmonary disease) ([place])   . GERD (gastroesophageal reflux disease)   . New onset of headaches   . Osteopenia   . PMR (polymyalgia rheumatica) ([facility])   . Strain of chest wall 01/20/2011  . Tobacco abuse      Social History   Social History  . Marital status: Married    Spouse name: N/A  . Number of children: 1  . Years of education: N/A   Occupational History  .  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 Smoking status: Former Smoker    Packs/day: 1.50    Years: 40.00    Types: Cigarettes    Quit date: 02/11/2009  . Smokeless tobacco: Never Used  . Alcohol use No  . Drug use: No  . Sexual activity: No   Other Topics Concern  . Not on file   Social History Narrative   Lives in [place] with her husband.  Takes care of her two grandchildren, ages 48 and 2, everyday in [place].  Has one son who is 55.   Desires CPR   Would not want prolonged life support if futile    Past Surgical History:    Procedure Laterality Date  . ABDOMINAL HYSTERECTOMY    . APPENDECTOMY    . CERVICAL FUSION    . CHOLECYSTECTOMY    . HEMORRHOID SURGERY    . LUMBAR FUSION    . TUBAL LIGATION      Family History  Problem Relation Age of Onset  . Heart disease Mother   . Cancer Father     lung/chest wall   . Prostate cancer Father   . Diabetes Brother     siblings  . Hypertension Sister     x 2  . Heart disease Brother   . Breast cancer Paternal Grandmother   . Heart disease Sister   . Diabetes Sister   . Tuberculosis Sister   . Cirrhosis Brother     Allergies  Allergen Reactions  . Food     MANGO  . Prednisone     REACTION: hives, difficulty breathing, facial swelling.   Marland Kitchen Prevnar 13 [Pneumococcal 13-Val Conj Vacc]     Current Outpatient Prescriptions on File Prior to Visit  Medication Sig Dispense Refill  . albuterol (PROVENTIL HFA;VENTOLIN HFA) 108 (90 Base) MCG/ACT inhaler Inhale 2 puffs into the lungs every 6 (  six) hours as needed for wheezing or shortness of breath. 1 Inhaler 1  . ALPRAZolam (XANAX) 0.25 MG tablet Take 1 tablet (0.25 mg total) by mouth daily as needed for anxiety. 20 tablet 0  . aspirin 81 MG tablet Take 81 mg by mouth every other day.     . Calcium Citrate-Vitamin D (GNP CALCIUM CITRATE +D3 PO) Take 1 tablet by mouth daily.    Marland Kitchen levothyroxine (SYNTHROID, LEVOTHROID) 75 MCG tablet TAKE 1 TABLET (75 MCG TOTAL) BY MOUTH DAILY. 90 tablet 3  . omeprazole (PRILOSEC) 40 MG capsule TAKE 1 CAPSULE BY MOUTH DAILY 30 capsule 0   No current facility-administered medications on file prior to visit.     BP 124/80   Pulse 82   Temp 98.4 F (36.9 C) (Oral)   Ht '4\' 11"'$  (1.499 m)   Wt 179 lb (81.2 kg)   SpO2 95%   BMI 36.15 kg/m    Objective:   Physical Exam  Constitutional: She appears well-nourished.  Neck: Neck supple.  Cardiovascular: Normal rate and regular rhythm.   Calf size equal bilaterally.  Pulmonary/Chest: Effort normal.  Skin: Skin is warm and  dry. No erythema.  Mild healing light blue bruise to left posterior calf with 1/4cm rounded, firm hematoma.  Mild healing bruise to right lower extremity.          Assessment & Plan:  Bruise with Hematoma:  Bruises to extremities as noted above. Knot of concern representative of hematoma. No suspicion for DVT, clotting disorder, bleeding disorder. Reassurance provided.  Discussed that bruises may take time to heal, especially with hematoma.  Apply Ice. Return precautions provided. CBC from prior labs with normal platelets.  Sheral Flow, NP

## 2016-02-01 NOTE — Patient Instructions (Signed)
Your bruise will heal on it's own.  The knot is dead tissue and blood that will dissolve on its own.  Please notify me if you develop additional bruising, and/or if these bruises do not heal.  It was a pleasure to see you today!

## 2016-02-28 ENCOUNTER — Ambulatory Visit: Payer: Medicare Other | Admitting: Primary Care

## 2016-02-29 ENCOUNTER — Ambulatory Visit (INDEPENDENT_AMBULATORY_CARE_PROVIDER_SITE_OTHER): Payer: Medicare Other | Admitting: Primary Care

## 2016-02-29 VITALS — BP 124/80 | HR 85 | Temp 98.8°F | Ht 59.0 in | Wt 178.0 lb

## 2016-02-29 DIAGNOSIS — J069 Acute upper respiratory infection, unspecified: Secondary | ICD-10-CM | POA: Diagnosis not present

## 2016-02-29 MED ORDER — HYDROCOD POLST-CPM POLST ER 10-8 MG/5ML PO SUER: 5.0000 mL | Freq: Two times a day (BID) | ORAL | 0 refills | Status: DC | PRN

## 2016-02-29 NOTE — Progress Notes (Signed)
Subjective:    Patient ID: Teresa Jensen, female    DOB: November 26, 1945, 70 y.o.   MRN: [id number]  HPI  [name] is a 71 year old female who presents today with a chief complaint of URI symptoms. She's had sore throat, dry cough, fevers, fatigue, cough. Her symptoms have been present for the past 2 days. She denies shortness of breath, nausea, chest pain. She's taken tylenol with improvement to her fevers and has not required use of her albuterol inhaler. She's been around her grandson who was diagnosed (not tested) and treated for influenza. Overall she's not feeling bad.  Review of Systems  Constitutional: Positive for chills, fatigue and fever.  HENT: Positive for sore throat. Negative for congestion and sinus pressure.   Respiratory: Positive for cough. Negative for shortness of breath and wheezing.   Cardiovascular: Negative for chest pain.  Gastrointestinal: Negative for nausea.       Past Medical History:  Diagnosis Date  . Anxiety   . COPD (chronic obstructive pulmonary disease) ([place])   . GERD (gastroesophageal reflux disease)   . New onset of headaches   . Osteopenia   . PMR (polymyalgia rheumatica) ([facility])   . Strain of chest wall 01/20/2011  . Tobacco abuse      Social History   Social History  . Marital status: Married    Spouse name: N/A  . Number of children: 1  . Years of education: N/A   Occupational History  . [employer] as a Forensic psychologist Retired    retired   Social History Main Topics  . Smoking status: Former Smoker    Packs/day: 1.50    Years: 40.00    Types: Cigarettes    Quit date: 02/11/2009  . Smokeless tobacco: Never Used  . Alcohol use No  . Drug use: No  . Sexual activity: No   Other Topics Concern  . Not on file   Social History Narrative   Lives in [place] with her husband.  Takes care of her two grandchildren, ages 55 and 2, everyday in [place].  Has one son who is 34.   Desires CPR   Would not want prolonged life support  if futile    Past Surgical History:  Procedure Laterality Date  . ABDOMINAL HYSTERECTOMY    . APPENDECTOMY    . CERVICAL FUSION    . CHOLECYSTECTOMY    . HEMORRHOID SURGERY    . LUMBAR FUSION    . TUBAL LIGATION      Family History  Problem Relation Age of Onset  . Heart disease Mother   . Cancer Father     lung/chest wall   . Prostate cancer Father   . Diabetes Brother     siblings  . Hypertension Sister     x 2  . Heart disease Brother   . Breast cancer Paternal Grandmother   . Heart disease Sister   . Diabetes Sister   . Tuberculosis Sister   . Cirrhosis Brother     Allergies  Allergen Reactions  . Food     MANGO  . Prednisone     REACTION: hives, difficulty breathing, facial swelling.   Marland Kitchen Prevnar 13 [Pneumococcal 13-Val Conj Vacc]     Current Outpatient Prescriptions on File Prior to Visit  Medication Sig Dispense Refill  . albuterol (PROVENTIL HFA;VENTOLIN HFA) 108 (90 Base) MCG/ACT inhaler Inhale 2 puffs into the lungs every 6 (six) hours as needed for wheezing or shortness of breath.  1 Inhaler 1  . ALPRAZolam (XANAX) 0.25 MG tablet Take 1 tablet (0.25 mg total) by mouth daily as needed for anxiety. 20 tablet 0  . aspirin 81 MG tablet Take 81 mg by mouth every other day.     . Calcium Citrate-Vitamin D (GNP CALCIUM CITRATE +D3 PO) Take 1 tablet by mouth daily.    Marland Kitchen levothyroxine (SYNTHROID, LEVOTHROID) 75 MCG tablet TAKE 1 TABLET (75 MCG TOTAL) BY MOUTH DAILY. 90 tablet 3  . omeprazole (PRILOSEC) 40 MG capsule TAKE 1 CAPSULE BY MOUTH DAILY 30 capsule 0   No current facility-administered medications on file prior to visit.     BP 124/80   Pulse 85   Temp 98.8 F (37.1 C) (Oral)   Ht '4\' 11"'$  (1.499 m)   Wt 178 lb (80.7 kg)   SpO2 93%   BMI 35.95 kg/m    Objective:   Physical Exam  Constitutional: She appears well-nourished.  HENT:  Right Ear: Tympanic membrane and ear canal normal.  Left Ear: Tympanic membrane and ear canal normal.  Nose: Right  sinus exhibits no maxillary sinus tenderness and no frontal sinus tenderness. Left sinus exhibits no maxillary sinus tenderness and no frontal sinus tenderness.  Mouth/Throat: Oropharynx is clear and moist.  Eyes: Conjunctivae are normal.  Neck: Neck supple.  Cardiovascular: Normal rate and regular rhythm.   Pulmonary/Chest: Effort normal. She has wheezes in the right upper field and the left upper field. She has no rales.  Mild wheezing  Lymphadenopathy:    She has no cervical adenopathy.  Skin: Skin is warm and dry.          Assessment & Plan:  URI:  Cough, fatigue, fevers x 2 days. Improvement in fevers with tylenol. Around her grandson with similar symptoms. Exam today with mild wheezing to apices, otherwise unremarkable. Vitals stable pre baseline. Suspect viral URI and will treat with supportive measures. Rx for Tussionex provided to use PRN HS. Discussed use of Flonase. Strict return precautions provided.  Sheral Flow, NP

## 2016-02-29 NOTE — Patient Instructions (Signed)
Your symptoms are representative of a viral illness which will resolve on its own over time. Our goal is to treat your symptoms in order to aid your body in the healing process and to make you more comfortable.   You may take the Tussionex cough suppressant twice daily as needed for cough and rest. Caution this medication contains codeine and will make you feel drowsy.  Nasal Congestion/Ear Pressure: Try using Flonase (fluticasone) nasal spray. Instill 1 spray in each nostril twice daily.   Cough/Congestion/Wheezing: Use your albuterol inhaler: Inhale 2 puffs into the lungs every 6 to 8 hours as needed for wheezing and/or shortness of breath.   Please notify me if you develop persistent fevers of 101, start coughing up green mucous, notice increased fatigue or weakness, or feel worse after 1 week of onset of symptoms.   Increase consumption of water intake and rest.  It was a pleasure to see you today!

## 2016-02-29 NOTE — Progress Notes (Signed)
Pre visit review using our clinic review tool, if applicable. No additional management support is needed unless otherwise documented below in the visit note. 

## 2016-03-29 ENCOUNTER — Ambulatory Visit: Payer: Medicare Other | Admitting: Primary Care

## 2016-04-02 ENCOUNTER — Encounter: Payer: Self-pay | Admitting: Primary Care

## 2016-04-02 ENCOUNTER — Ambulatory Visit (INDEPENDENT_AMBULATORY_CARE_PROVIDER_SITE_OTHER): Payer: Medicare Other | Admitting: Primary Care

## 2016-04-02 VITALS — BP 122/80 | HR 78 | Temp 98.1°F | Ht 59.0 in | Wt 179.8 lb

## 2016-04-02 DIAGNOSIS — T148XXA Other injury of unspecified body region, initial encounter: Secondary | ICD-10-CM

## 2016-04-02 NOTE — Progress Notes (Signed)
Subjective:    Patient ID: Teresa Jensen, female    DOB: Nov 11, 1945, 71 y.o.   MRN: [id number]  HPI  [name] is a 71 year old female who presents today with a chief complaint of left lateral chest wall pain. She first noticed this 2 weeks ago while sneezing. She describes her pain as "sore" that is intermittent. Her pain is worse with pressure/movement. She denies recent injury/trauma, cold symptoms, chest pain, breast pain, shortness of breath, recent long travel. Overall her pain has improved with use of tylenol.    Review of Systems  Constitutional: Negative for fever.  HENT: Negative for congestion.   Respiratory: Negative for cough and shortness of breath.   Cardiovascular: Negative for chest pain.  Musculoskeletal: Positive for myalgias.  Neurological: Negative for numbness.       Past Medical History:  Diagnosis Date  . Anxiety   . COPD (chronic obstructive pulmonary disease) ([facility])   . GERD (gastroesophageal reflux disease)   . New onset of headaches   . Osteopenia   . PMR (polymyalgia rheumatica) ([facility])   . Strain of chest wall 01/20/2011  . Tobacco abuse      Social History   Social History  . Marital status: Married    Spouse name: N/A  . Number of children: 1  . Years of education: N/A   Occupational History  . [employer] as a Forensic psychologist Retired    retired   Social History Main Topics  . Smoking status: Former Smoker    Packs/day: 1.50    Years: 40.00    Types: Cigarettes    Quit date: 02/11/2009  . Smokeless tobacco: Never Used  . Alcohol use No  . Drug use: No  . Sexual activity: No   Other Topics Concern  . Not on file   Social History Narrative   Lives in [place] with her husband.  Takes care of her two grandchildren, ages 60 and 2, everyday in [place].  Has one son who is 76.   Desires CPR   Would not want prolonged life support if futile    Past Surgical History:  Procedure Laterality Date  . ABDOMINAL HYSTERECTOMY    .  APPENDECTOMY    . CERVICAL FUSION    . CHOLECYSTECTOMY    . HEMORRHOID SURGERY    . LUMBAR FUSION    . TUBAL LIGATION      Family History  Problem Relation Age of Onset  . Heart disease Mother   . Cancer Father     lung/chest wall   . Prostate cancer Father   . Diabetes Brother     siblings  . Hypertension Sister     x 2  . Heart disease Brother   . Breast cancer Paternal Grandmother   . Heart disease Sister   . Diabetes Sister   . Tuberculosis Sister   . Cirrhosis Brother     Allergies  Allergen Reactions  . Food     MANGO  . Prednisone     REACTION: hives, difficulty breathing, facial swelling.   Marland Kitchen Prevnar 13 [Pneumococcal 13-Val Conj Vacc]     Current Outpatient Prescriptions on File Prior to Visit  Medication Sig Dispense Refill  . albuterol (PROVENTIL HFA;VENTOLIN HFA) 108 (90 Base) MCG/ACT inhaler Inhale 2 puffs into the lungs every 6 (six) hours as needed for wheezing or shortness of breath. 1 Inhaler 1  . aspirin 81 MG tablet Take 81 mg by mouth every other day.     Marland Kitchen  Calcium Citrate-Vitamin D (GNP CALCIUM CITRATE +D3 PO) Take 1 tablet by mouth daily.    Marland Kitchen levothyroxine (SYNTHROID, LEVOTHROID) 75 MCG tablet TAKE 1 TABLET (75 MCG TOTAL) BY MOUTH DAILY. 90 tablet 3  . omeprazole (PRILOSEC) 40 MG capsule TAKE 1 CAPSULE BY MOUTH DAILY 30 capsule 0  . ALPRAZolam (XANAX) 0.25 MG tablet Take 1 tablet (0.25 mg total) by mouth daily as needed for anxiety. (Patient not taking: Reported on 04/02/2016) 20 tablet 0   No current facility-administered medications on file prior to visit.     BP 122/80   Pulse 78   Temp 98.1 F (36.7 C) (Oral)   Ht '4\' 11"'$  (1.499 m)   Wt 179 lb 12.8 oz (81.6 kg)   SpO2 98%   BMI 36.32 kg/m    Objective:   Physical Exam  Constitutional: She appears well-nourished.  Neck: Neck supple.  Cardiovascular: Normal rate and regular rhythm.   Pulmonary/Chest: Effort normal and breath sounds normal. She has no rales.  Musculoskeletal:    Tender to left lateral chest wall around T 6-7. No bruising no rash.  Skin: Skin is warm and dry. No rash noted.          Assessment & Plan:  Muscle Strain:  Pain to left lateral chest wall x 2 weeks, overall improved with tylenol. Exam today with tenderness, suggestive of muscle strain. Do not suspect fracture, PE, shingles. Will have her switch to Ibuprofen and update. Return precautions provided.  Sheral Flow, NP

## 2016-04-02 NOTE — Patient Instructions (Signed)
Your symptoms represent a pulled muscle and possible inflammation of the tissue surrounding the chest wall.  I recommend Ibuprofen 600-800 mg two to three times daily as needed for pain and inflammation.  It may take another week or so for complete healing.  It was a pleasure to see you today!

## 2016-04-02 NOTE — Progress Notes (Signed)
Pre visit review using our clinic review tool, if applicable. No additional management support is needed unless otherwise documented below in the visit note. 

## 2016-05-11 ENCOUNTER — Ambulatory Visit: Payer: Medicare Other | Admitting: Podiatry

## 2016-05-14 ENCOUNTER — Encounter: Payer: Self-pay | Admitting: Primary Care

## 2016-05-14 ENCOUNTER — Ambulatory Visit (INDEPENDENT_AMBULATORY_CARE_PROVIDER_SITE_OTHER): Payer: Medicare Other | Admitting: Primary Care

## 2016-05-14 VITALS — BP 122/80 | HR 83 | Temp 98.1°F | Ht 59.0 in | Wt 176.8 lb

## 2016-05-14 DIAGNOSIS — B070 Plantar wart: Secondary | ICD-10-CM

## 2016-05-14 NOTE — Progress Notes (Signed)
Pre visit review using our clinic review tool, if applicable. No additional management support is needed unless otherwise documented below in the visit note. 

## 2016-05-14 NOTE — Progress Notes (Signed)
Subjective:    Patient ID: Teresa Jensen, female    DOB: Nov 29, 1945, 71 y.o.   MRN: [id number]  HPI  Ms. Teresa Jensen is a 71 year old female who presents today with a chief complaint of foot callous. Her callous is located to the left plantar foot that has been present for the past 1 year. The callous was filed down and treated 1 year ago. Her pain is present with pressure. She's been using corn pads without any improvement. She denies recent injury/trauma.  Review of Systems  Constitutional: Negative for fever.  Musculoskeletal:       Left plantar foot pain and sore       Past Medical History:  Diagnosis Date  . Anxiety   . COPD (chronic obstructive pulmonary disease) ([place])   . GERD (gastroesophageal reflux disease)   . New onset of headaches   . Osteopenia   . PMR (polymyalgia rheumatica) ([facility])   . Strain of chest wall 01/20/2011  . Tobacco abuse      Social History   Social History  . Marital status: Married    Spouse name: N/A  . Number of children: 1  . Years of education: N/A   Occupational History  . [employer] as a Forensic psychologist Retired    retired   Social History Main Topics  . Smoking status: Former Smoker    Packs/day: 1.50    Years: 40.00    Types: Cigarettes    Quit date: 02/11/2009  . Smokeless tobacco: Never Used  . Alcohol use No  . Drug use: No  . Sexual activity: No   Other Topics Concern  . Not on file   Social History Narrative   Lives in [place] with her husband.  Takes care of her two grandchildren, ages 43 and 2, everyday in [place].  Has one son who is 30.   Desires CPR   Would not want prolonged life support if futile    Past Surgical History:  Procedure Laterality Date  . ABDOMINAL HYSTERECTOMY    . APPENDECTOMY    . CERVICAL FUSION    . CHOLECYSTECTOMY    . HEMORRHOID SURGERY    . LUMBAR FUSION    . TUBAL LIGATION      Family History  Problem Relation Age of Onset  . Heart disease Mother   . Cancer Father    lung/chest wall   . Prostate cancer Father   . Diabetes Brother        siblings  . Hypertension Sister        x 2  . Heart disease Brother   . Breast cancer Paternal Grandmother   . Heart disease Sister   . Diabetes Sister   . Tuberculosis Sister   . Cirrhosis Brother     Allergies  Allergen Reactions  . Food     MANGO  . Prednisone     REACTION: hives, difficulty breathing, facial swelling.   Marland Kitchen Prevnar 13 [Pneumococcal 13-Val Conj Vacc]     Current Outpatient Prescriptions on File Prior to Visit  Medication Sig Dispense Refill  . albuterol (PROVENTIL HFA;VENTOLIN HFA) 108 (90 Base) MCG/ACT inhaler Inhale 2 puffs into the lungs every 6 (six) hours as needed for wheezing or shortness of breath. 1 Inhaler 1  . aspirin 81 MG tablet Take 81 mg by mouth every other day.     . Calcium Citrate-Vitamin D (GNP CALCIUM CITRATE +D3 PO) Take 1 tablet by mouth daily.    Marland Kitchen  levothyroxine (SYNTHROID, LEVOTHROID) 75 MCG tablet TAKE 1 TABLET (75 MCG TOTAL) BY MOUTH DAILY. 90 tablet 3  . omeprazole (PRILOSEC) 40 MG capsule TAKE 1 CAPSULE BY MOUTH DAILY 30 capsule 0  . ALPRAZolam (XANAX) 0.25 MG tablet Take 1 tablet (0.25 mg total) by mouth daily as needed for anxiety. (Patient not taking: Reported on 04/02/2016) 20 tablet 0   No current facility-administered medications on file prior to visit.     BP 122/80   Pulse 83   Temp 98.1 F (36.7 C) (Oral)   Ht '4\' 11"'$  (1.499 m)   Wt 176 lb 12.8 oz (80.2 kg)   SpO2 95%   BMI 35.71 kg/m    Objective:   Physical Exam  Constitutional: She appears well-nourished.  Neck: Neck supple.  Cardiovascular: Normal rate.   Pulmonary/Chest: Effort normal.  Skin: Skin is warm and dry. No erythema.  1/2 cm circular area to left plantar foot just proximal to great toe. Mild callous, appears to be plantar wart. Tender. No s/s of infection.           Assessment & Plan:  Plantar Wart:  Foot pain x 1 year. Callous filed down 1 year ago, pain since. Exam  today representative of plantar wart. No s/s of acute infection. Treated today with liquid nitrogen. Discussed OTC wart treatment.  Will have her follow up with podiatry if no improvement.  Sheral Flow, NP

## 2016-05-14 NOTE — Patient Instructions (Signed)
Purchase liquid wart removal today and use according to package instructions.   Please follow up with Podiatry if no improvement after several weeks.  It was a pleasure to see you today!

## 2016-05-24 ENCOUNTER — Ambulatory Visit (INDEPENDENT_AMBULATORY_CARE_PROVIDER_SITE_OTHER): Payer: Medicare Other | Admitting: Podiatry

## 2016-05-24 ENCOUNTER — Encounter: Payer: Self-pay | Admitting: Podiatry

## 2016-05-24 DIAGNOSIS — M79672 Pain in left foot: Secondary | ICD-10-CM

## 2016-05-24 DIAGNOSIS — Q828 Other specified congenital malformations of skin: Secondary | ICD-10-CM | POA: Diagnosis not present

## 2016-05-24 NOTE — Progress Notes (Signed)
Of this patient presents the office with chief complaint of a painful callus under the ball of her left foot. She says this callus is painful walking and wearing her shoes. She says it has been worked on over a year ago by Dr. Cannon Kettle, but has returned. She says the area is now painful to the touch. She says that she has been to her medical doctor who referred her to this office. She says she had an appointment with Dr. Amalia Hailey which she needed to cancel since he was unable to see  patient's that day. She now presents the office with severe pain and discomfort at the site of the callus under the ball of the left foot  GENERAL APPEARANCE: Alert, conversant. Appropriately groomed. No acute distress.  VASCULAR: Pedal pulses are  palpable at  [facility] and PT bilateral.  Capillary refill time is immediate to all digits,  Normal temperature gradient.  Digital hair growth is present bilateral  NEUROLOGIC: sensation is normal to 5.07 monofilament at 5/5 sites bilateral.  Light touch is intact bilateral, Muscle strength normal.  MUSCULOSKELETAL: acceptable muscle strength, tone and stability bilateral.  Intrinsic muscluature intact bilateral.  Rectus appearance of foot and digits noted bilateral.   DERMATOLOGIC: skin color, texture, and turgor are within normal limits.  Well circumscribed porokeratosis sub 1 left foot.  Extremely painful to touch.  No open lesions present.  Digital nails are asymptomatic. No drainage noted.  Porokeratosis left foot.  Debridement of porokeratotic lesion. After local anesthesia was provided of 2% Xylocaine plain. Patient was also dispensed and provided with padding to help to relieve pressure at the site. Told patient that she should return to the office in 3 months for continued evaluation and treatment. Discussed possible removal and excision of this lesion for in the future   Gardiner Barefoot DPM

## 2016-07-02 ENCOUNTER — Other Ambulatory Visit: Payer: Self-pay | Admitting: Family Medicine

## 2016-07-02 ENCOUNTER — Other Ambulatory Visit: Payer: Self-pay | Admitting: Primary Care

## 2016-07-02 DIAGNOSIS — Z1231 Encounter for screening mammogram for malignant neoplasm of breast: Secondary | ICD-10-CM

## 2016-08-06 ENCOUNTER — Ambulatory Visit
Admission: RE | Admit: 2016-08-06 | Discharge: 2016-08-06 | Disposition: A | Discharge status: Home / Self Care | Payer: Medicare Other | Source: Ambulatory Visit | Attending: Primary Care | Admitting: Primary Care

## 2016-08-06 DIAGNOSIS — Z1231 Encounter for screening mammogram for malignant neoplasm of breast: Secondary | ICD-10-CM

## 2016-08-24 ENCOUNTER — Encounter: Payer: Self-pay | Admitting: Primary Care

## 2016-08-24 ENCOUNTER — Ambulatory Visit (INDEPENDENT_AMBULATORY_CARE_PROVIDER_SITE_OTHER): Payer: Medicare Other | Admitting: Primary Care

## 2016-08-24 DIAGNOSIS — M1990 Unspecified osteoarthritis, unspecified site: Secondary | ICD-10-CM | POA: Diagnosis not present

## 2016-08-24 NOTE — Progress Notes (Signed)
Subjective:    Patient ID: Teresa Jensen, female    DOB: [birth year], 71 y.o.   MRN: [id number]  HPI  Teresa Jensen is a 71 year old female with a history of obesity, vitamin D deficiency, hypothyroidism, carpal tunnel with surgery to the left side who presents today with a chief complaint of hand stiffness and numbness. Her numbness is located to the 3rd and 4th fingertips that has been present intermittently for the past 2 weeks. She also reports stiffness to her right hand, worse in the morning that is improved throughout the day. She denies injury/trauma, swelling in the hand or joints, redness, painful, slurred speech, unilateral weakness. She's not had to take anything OTC for her symptoms. She believes this feels like arthritis.  Review of Systems  Eyes: Negative for visual disturbance.  Respiratory: Negative for shortness of breath.   Cardiovascular: Negative for chest pain.  Musculoskeletal:       Right hand stiffness  Neurological: Positive for numbness. Negative for dizziness and weakness.       Right 3rd and 4th fingertip numbness       Past Medical History:  Diagnosis Date  . Anxiety   . COPD (chronic obstructive pulmonary disease) ([place])   . GERD (gastroesophageal reflux disease)   . New onset of headaches   . Osteopenia   . PMR (polymyalgia rheumatica) ([facility])   . Strain of chest wall 01/20/2011  . Tobacco abuse      Social History   Social History  . Marital status: Married    Spouse name: N/A  . Number of children: 1  . Years of education: N/A   Occupational History  . [employer] as a Forensic psychologist Retired    retired   Social History Main Topics  . Smoking status: Former Smoker    Packs/day: 1.50    Years: 40.00    Types: Cigarettes    Quit date: 02/11/2009  . Smokeless tobacco: Never Used  . Alcohol use No  . Drug use: No  . Sexual activity: No   Other Topics Concern  . Not on file   Social History Narrative   Lives in [place] with her  husband.  Takes care of her two grandchildren, ages 71 and 2, everyday in [place].  Has one son who is 6.   Desires CPR   Would not want prolonged life support if futile    Past Surgical History:  Procedure Laterality Date  . ABDOMINAL HYSTERECTOMY    . APPENDECTOMY    . CERVICAL FUSION    . CHOLECYSTECTOMY    . HEMORRHOID SURGERY    . LUMBAR FUSION    . TUBAL LIGATION      Family History  Problem Relation Age of Onset  . Heart disease Mother   . Cancer Father        lung/chest wall   . Prostate cancer Father   . Diabetes Brother        siblings  . Hypertension Sister        x 2  . Heart disease Brother   . Breast cancer Paternal Grandmother   . Heart disease Sister   . Diabetes Sister   . Tuberculosis Sister   . Cirrhosis Brother     Allergies  Allergen Reactions  . Food     MANGO  . Prednisone     REACTION: hives, difficulty breathing, facial swelling.   Marland Kitchen Prevnar 13 [Pneumococcal 13-Val Conj Vacc]  Current Outpatient Prescriptions on File Prior to Visit  Medication Sig Dispense Refill  . albuterol (PROVENTIL HFA;VENTOLIN HFA) 108 (90 Base) MCG/ACT inhaler Inhale 2 puffs into the lungs every 6 (six) hours as needed for wheezing or shortness of breath. 1 Inhaler 1  . ALPRAZolam (XANAX) 0.25 MG tablet Take 1 tablet (0.25 mg total) by mouth daily as needed for anxiety. 20 tablet 0  . aspirin 81 MG tablet Take 81 mg by mouth every other day.     . Calcium Citrate-Vitamin D (GNP CALCIUM CITRATE +D3 PO) Take 1 tablet by mouth daily.    Marland Kitchen levothyroxine (SYNTHROID, LEVOTHROID) 75 MCG tablet TAKE 1 TABLET (75 MCG TOTAL) BY MOUTH DAILY. 90 tablet 3  . omeprazole (PRILOSEC) 40 MG capsule TAKE 1 CAPSULE BY MOUTH DAILY 30 capsule 0   No current facility-administered medications on file prior to visit.     BP 124/78   Pulse 63   Temp 98.2 F (36.8 C) (Oral)   Ht 4\' 11"  (1.499 m)   Wt 180 lb 12.8 oz (82 kg)   SpO2 94%   BMI 36.52 kg/m    Objective:    Physical Exam  Constitutional: She appears well-nourished.  Neck: Neck supple.  Cardiovascular: Normal rate.   Pulmonary/Chest: Effort normal.  Musculoskeletal:  Grips equal bilaterally, no bilateral upper extremity weakness, 5/5 strength bilaterally  Neurological:  Negative phalen and tinel's signs  Skin: Skin is warm and dry. No erythema.          Assessment & Plan:

## 2016-08-24 NOTE — Patient Instructions (Signed)
Please call me if you notice increased stiffness and numbness, develop pain, notice weakness.  It was a pleasure to see you today!   Osteoarthritis Osteoarthritis is a type of arthritis that affects tissue that covers the ends of bones in joints (cartilage). Cartilage acts as a cushion between the bones and helps them move smoothly. Osteoarthritis results when cartilage in the joints gets worn down. Osteoarthritis is sometimes called "wear and tear" arthritis. Osteoarthritis is the most common form of arthritis. It often occurs in older people. It is a condition that gets worse over time (a progressive condition). Joints that are most often affected by this condition are in:  Fingers.  Toes.  Hips.  Knees.  Spine, including neck and lower back.  What are the causes? This condition is caused by age-related wearing down of cartilage that covers the ends of bones. What increases the risk? The following factors may make you more likely to develop this condition:  Older age.  Being overweight or obese.  Overuse of joints, such as in athletes.  Past injury of a joint.  Past surgery on a joint.  Family history of osteoarthritis.  What are the signs or symptoms? The main symptoms of this condition are pain, swelling, and stiffness in the joint. The joint may lose its shape over time. Small pieces of bone or cartilage may break off and float inside of the joint, which may cause more pain and damage to the joint. Small deposits of bone (osteophytes) may grow on the edges of the joint. Other symptoms may include:  A grating or scraping feeling inside the joint when you move it.  Popping or creaking sounds when you move.  Symptoms may affect one or more joints. Osteoarthritis in a major joint, such as your knee or hip, can make it painful to walk or exercise. If you have osteoarthritis in your hands, you might not be able to grip items, twist your hand, or control small movements of  your hands and fingers (fine motor skills). How is this diagnosed? This condition may be diagnosed based on:  Your medical history.  A physical exam.  Your symptoms.  X-rays of the affected joint(s).  Blood tests to rule out other types of arthritis.  How is this treated? There is no cure for this condition, but treatment can help to control pain and improve joint function. Treatment plans may include:  A prescribed exercise program that allows for rest and joint relief. You may work with a physical therapist.  A weight control plan.  Pain relief techniques, such as: ? Applying heat and cold to the joint. ? Electric pulses delivered to nerve endings under the skin (transcutaneous electrical nerve stimulation, or TENS). ? Massage. ? Certain nutritional supplements.  NSAIDs or prescription medicines to help relieve pain.  Medicine to help relieve pain and inflammation (corticosteroids). This can be given by mouth (orally) or as an injection.  Assistive devices, such as a brace, wrap, splint, specialized glove, or cane.  Surgery, such as: ? An osteotomy. This is done to reposition the bones and relieve pain or to remove loose pieces of bone and cartilage. ? Joint replacement surgery. You may need this surgery if you have very bad (advanced) osteoarthritis.  Follow these instructions at home: Activity  Rest your affected joints as directed by your health care provider.  Do not drive or use heavy machinery while taking prescription pain medicine.  Exercise as directed. Your health care provider or physical therapist may  recommend specific types of exercise, such as: ? Strengthening exercises. These are done to strengthen the muscles that support joints that are affected by arthritis. They can be performed with weights or with exercise bands to add resistance. ? Aerobic activities. These are exercises, such as brisk walking or water aerobics, that get your heart  pumping. ? Range-of-motion activities. These keep your joints easy to move. ? Balance and agility exercises. Managing pain, stiffness, and swelling  If directed, apply heat to the affected area as often as told by your health care provider. Use the heat source that your health care provider recommends, such as a moist heat pack or a heating pad. ? If you have a removable assistive device, remove it as told by your health care provider. ? Place a towel between your skin and the heat source. If your health care provider tells you to keep the assistive device on while you apply heat, place a towel between the assistive device and the heat source. ? Leave the heat on for 20-30 minutes. ? Remove the heat if your skin turns bright red. This is especially important if you are unable to feel pain, heat, or cold. You may have a greater risk of getting burned.  If directed, put ice on the affected joint: ? If you have a removable assistive device, remove it as told by your health care provider. ? Put ice in a plastic bag. ? Place a towel between your skin and the bag. If your health care provider tells you to keep the assistive device on during icing, place a towel between the assistive device and the bag. ? Leave the ice on for 20 minutes, 2-3 times a day. General instructions  Take over-the-counter and prescription medicines only as told by your health care provider.  Maintain a healthy weight. Follow instructions from your health care provider for weight control. These may include dietary restrictions.  Do not use any products that contain nicotine or tobacco, such as cigarettes and e-cigarettes. These can delay bone healing. If you need help quitting, ask your health care provider.  Use assistive devices as directed by your health care provider.  Keep all follow-up visits as told by your health care provider. This is important. Where to find more information:  [employer] of Arthritis  and Musculoskeletal and Skin Diseases: www.niams.SouthExposed.es  [employer] on Aging: http://kim-miller.com/  American College of Rheumatology: www.rheumatology.org Contact a health care provider if:  Your skin turns red.  You develop a rash.  You have pain that gets worse.  You have a fever along with joint or muscle aches. Get help right away if:  You lose a lot of weight.  You suddenly lose your appetite.  You have night sweats. Summary  Osteoarthritis is a type of arthritis that affects tissue covering the ends of bones in joints (cartilage).  This condition is caused by age-related wearing down of cartilage that covers the ends of bones.  The main symptom of this condition is pain, swelling, and stiffness in the joint.  There is no cure for this condition, but treatment can help to control pain and improve joint function. This information is not intended to replace advice given to you by your health care provider. Make sure you discuss any questions you have with your health care provider. Document Released: 12/18/2004 Document Revised: 08/22/2015 Document Reviewed: 08/22/2015 Elsevier Interactive Patient Education  [employer].

## 2016-08-24 NOTE — Assessment & Plan Note (Signed)
Symptoms and examination represent sound arthritis, neuro exam unremarkable. Could be early carpal tunel. Recommended stretching/exercises. Ibuprofen/tylenol PRN. Consider labs/xray if symptoms progress.

## 2016-09-19 ENCOUNTER — Telehealth: Payer: Self-pay

## 2016-09-19 DIAGNOSIS — R202 Paresthesia of skin: Secondary | ICD-10-CM

## 2016-09-19 DIAGNOSIS — E039 Hypothyroidism, unspecified: Secondary | ICD-10-CM

## 2016-09-19 NOTE — Telephone Encounter (Signed)
Spoken and notified patient of Kate's comments. Patient stated that no shoulder or neck pain.  Already schedule patient for lab appt on 09/20/2016

## 2016-09-19 NOTE — Telephone Encounter (Signed)
Noted, orders placed.

## 2016-09-19 NOTE — Telephone Encounter (Signed)
Is she experiencing any shoulder or neck pain? If no pain in the shoulder or neck and I recommend she come in for labs, please schedule her at her convenience. Let me know.

## 2016-09-19 NOTE — Telephone Encounter (Signed)
Pt left a message on triage line stating she saw Teresa Jensen last month for an issue with her fingertips being numb. She said she is still having the issue and wanted to know what the next step would be. Please advise.

## 2016-09-20 ENCOUNTER — Other Ambulatory Visit (INDEPENDENT_AMBULATORY_CARE_PROVIDER_SITE_OTHER): Payer: Medicare Other

## 2016-09-20 DIAGNOSIS — R202 Paresthesia of skin: Secondary | ICD-10-CM | POA: Diagnosis not present

## 2016-09-20 DIAGNOSIS — E039 Hypothyroidism, unspecified: Secondary | ICD-10-CM

## 2016-09-20 LAB — COMPREHENSIVE METABOLIC PANEL
ALT: 23 U/L (ref 0–35)
AST: 22 U/L (ref 0–37)
Albumin: 4 g/dL (ref 3.5–5.2)
Alkaline Phosphatase: 68 U/L (ref 39–117)
BUN: 10 mg/dL (ref 6–23)
CO2: 31 mEq/L (ref 19–32)
Calcium: 9.6 mg/dL (ref 8.4–10.5)
Chloride: 104 mEq/L (ref 96–112)
Creatinine, Ser: 0.67 mg/dL (ref 0.40–1.20)
GFR: 92.09 mL/min (ref >60.00)
Glucose, Bld: 149 mg/dL — ABNORMAL HIGH (ref 70–99)
Potassium: 4.4 mEq/L (ref 3.5–5.1)
Sodium: 142 mEq/L (ref 135–145)
Total Bilirubin: 0.5 mg/dL (ref 0.2–1.2)
Total Protein: 7.7 g/dL (ref 6.0–8.3)

## 2016-09-20 LAB — CBC
HCT: 45.4 % (ref 36.0–46.0)
Hemoglobin: 14.8 g/dL (ref 12.0–15.0)
MCHC: 32.6 g/dL (ref 30.0–36.0)
MCV: 91.9 fl (ref 78.0–100.0)
Platelets: 321 10*3/uL (ref 150.0–400.0)
RBC: 4.94 Mil/uL (ref 3.87–5.11)
RDW: 14.4 % (ref 11.5–15.5)
WBC: 9.4 10*3/uL (ref 4.0–10.5)

## 2016-09-20 LAB — VITAMIN B12: Vitamin B-12: 224 pg/mL (ref 211–911)

## 2016-09-20 LAB — TSH: TSH: 2.21 u[IU]/mL (ref 0.35–4.50)

## 2016-09-21 ENCOUNTER — Other Ambulatory Visit: Payer: Self-pay | Admitting: Primary Care

## 2016-09-21 DIAGNOSIS — R202 Paresthesia of skin: Secondary | ICD-10-CM

## 2016-09-27 ENCOUNTER — Other Ambulatory Visit: Payer: Self-pay | Admitting: Primary Care

## 2016-09-27 ENCOUNTER — Encounter: Payer: Self-pay | Admitting: Primary Care

## 2016-09-27 ENCOUNTER — Ambulatory Visit (INDEPENDENT_AMBULATORY_CARE_PROVIDER_SITE_OTHER): Payer: Medicare Other | Admitting: Primary Care

## 2016-09-27 ENCOUNTER — Ambulatory Visit (INDEPENDENT_AMBULATORY_CARE_PROVIDER_SITE_OTHER)
Admission: RE | Admit: 2016-09-27 | Discharge: 2016-09-27 | Disposition: A | Discharge status: Home / Self Care | Payer: Medicare Other | Source: Ambulatory Visit | Attending: Primary Care | Admitting: Primary Care

## 2016-09-27 VITALS — BP 172/88 | HR 97 | Temp 98.2°F | Wt 183.8 lb

## 2016-09-27 DIAGNOSIS — S92309A Fracture of unspecified metatarsal bone(s), unspecified foot, initial encounter for closed fracture: Secondary | ICD-10-CM

## 2016-09-27 DIAGNOSIS — M79672 Pain in left foot: Secondary | ICD-10-CM

## 2016-09-27 NOTE — Patient Instructions (Signed)
Complete xray(s) prior to leaving today. I will notify you of your results once received.  Continue to apply ice. Wrap the foot for support. Take tylenol as needed for pain.   It was a pleasure to see you today!

## 2016-09-27 NOTE — Progress Notes (Signed)
Subjective:    Patient ID: Teresa Jensen, female    DOB: [birth year], 71 y.o.   MRN: [id number]  HPI  [name] is a 71 year old female with a history of osteoarthritis and obesity who presents today with a chief complaint of left foot and left lateral thoracic chest wall pain pain.   She was eating dinner last night, heard a crack in the chair leg, and ended up on the floor. She landed on her buttocks. She's notice a lot of bruising to her dorsal foot and left lateral thoracic chest wall just after the incident. She denies hitting her head. She denies shortness of breath, dizziness, headaches, rib pain. She's taken tylenol, applied ice, and elevating her leg with some improvement.   Review of Systems  Cardiovascular: Negative for chest pain.  Musculoskeletal:       Left foot pain. Left lateral thoracic chest wall pain  Skin: Positive for color change.  Neurological: Negative for dizziness and headaches.       Past Medical History:  Diagnosis Date  . Anxiety   . COPD (chronic obstructive pulmonary disease) ([place])   . GERD (gastroesophageal reflux disease)   . New onset of headaches   . Osteopenia   . PMR (polymyalgia rheumatica) ([facility])   . Strain of chest wall 01/20/2011  . Tobacco abuse      Social History   Social History  . Marital status: Married    Spouse name: N/A  . Number of children: 1  . Years of education: N/A   Occupational History  . [employer] as a Forensic psychologist Retired    retired   Social History Main Topics  . Smoking status: Former Smoker    Packs/day: 1.50    Years: 40.00    Types: Cigarettes    Quit date: 02/11/2009  . Smokeless tobacco: Never Used  . Alcohol use No  . Drug use: No  . Sexual activity: No   Other Topics Concern  . Not on file   Social History Narrative   Lives in [place] with her husband.  Takes care of her two grandchildren, ages 66 and 2, everyday in [place].  Has one son who is 107.   Desires CPR   Would not want  prolonged life support if futile    Past Surgical History:  Procedure Laterality Date  . ABDOMINAL HYSTERECTOMY    . APPENDECTOMY    . CERVICAL FUSION    . CHOLECYSTECTOMY    . HEMORRHOID SURGERY    . LUMBAR FUSION    . TUBAL LIGATION      Family History  Problem Relation Age of Onset  . Heart disease Mother   . Cancer Father        lung/chest wall   . Prostate cancer Father   . Diabetes Brother        siblings  . Hypertension Sister        x 2  . Heart disease Brother   . Breast cancer Paternal Grandmother   . Heart disease Sister   . Diabetes Sister   . Tuberculosis Sister   . Cirrhosis Brother     Allergies  Allergen Reactions  . Food     MANGO  . Prednisone     REACTION: hives, difficulty breathing, facial swelling.   Marland Kitchen Prevnar 13 [Pneumococcal 13-Val Conj Vacc]     Current Outpatient Prescriptions on File Prior to Visit  Medication Sig Dispense Refill  . albuterol (PROVENTIL  HFA;VENTOLIN HFA) 108 (90 Base) MCG/ACT inhaler Inhale 2 puffs into the lungs every 6 (six) hours as needed for wheezing or shortness of breath. 1 Inhaler 1  . ALPRAZolam (XANAX) 0.25 MG tablet Take 1 tablet (0.25 mg total) by mouth daily as needed for anxiety. 20 tablet 0  . aspirin 81 MG tablet Take 81 mg by mouth every other day.     . Calcium Citrate-Vitamin D (GNP CALCIUM CITRATE +D3 PO) Take 1 tablet by mouth daily.    Marland Kitchen levothyroxine (SYNTHROID, LEVOTHROID) 75 MCG tablet TAKE 1 TABLET (75 MCG TOTAL) BY MOUTH DAILY. 90 tablet 3  . omeprazole (PRILOSEC) 40 MG capsule TAKE 1 CAPSULE BY MOUTH DAILY 30 capsule 0   No current facility-administered medications on file prior to visit.     BP (!) 172/88   Pulse 97   Temp 98.2 F (36.8 C) (Oral)   Wt 183 lb 12 oz (83.3 kg)   SpO2 92%   BMI 37.11 kg/m    Objective:   Physical Exam  Constitutional: She appears well-nourished.  Cardiovascular: Normal rate.   Pulmonary/Chest: Effort normal and breath sounds normal.     Musculoskeletal:       Left ankle: She exhibits ecchymosis. She exhibits normal range of motion. No tenderness.       Feet:  Decrease in ROM and pain with plantar flexion. Weight bearing with limp using one crutch in clinic. Bruising as noted in picture.  Skin: Skin is warm and dry.  Moderate bruising to left dorsal foot midway to toes.           Assessment & Plan:  Acute Foot Pain:  Since fall from after chair broke in sitting position last night. Exam today with moderate bruising. Decrease in ROM with pain. Check plain films of left foot to rule out metatarsal fracture. Discussed ice, tylenol, elevation, rest.  Will await results.  Sheral Flow, NP

## 2016-09-28 DIAGNOSIS — S92309A Fracture of unspecified metatarsal bone(s), unspecified foot, initial encounter for closed fracture: Secondary | ICD-10-CM | POA: Insufficient documentation

## 2016-10-03 ENCOUNTER — Other Ambulatory Visit: Payer: Self-pay | Admitting: Primary Care

## 2016-10-03 ENCOUNTER — Ambulatory Visit (INDEPENDENT_AMBULATORY_CARE_PROVIDER_SITE_OTHER)
Admission: RE | Admit: 2016-10-03 | Discharge: 2016-10-03 | Disposition: A | Discharge status: Home / Self Care | Payer: Medicare Other | Source: Ambulatory Visit | Attending: Primary Care | Admitting: Primary Care

## 2016-10-03 DIAGNOSIS — R0789 Other chest pain: Secondary | ICD-10-CM | POA: Diagnosis not present

## 2016-10-10 ENCOUNTER — Other Ambulatory Visit (INDEPENDENT_AMBULATORY_CARE_PROVIDER_SITE_OTHER): Payer: Medicare Other

## 2016-10-10 ENCOUNTER — Other Ambulatory Visit: Payer: Self-pay | Admitting: Primary Care

## 2016-10-10 DIAGNOSIS — E559 Vitamin D deficiency, unspecified: Secondary | ICD-10-CM

## 2016-10-10 DIAGNOSIS — E6609 Other obesity due to excess calories: Secondary | ICD-10-CM | POA: Diagnosis not present

## 2016-10-10 DIAGNOSIS — R7303 Prediabetes: Secondary | ICD-10-CM | POA: Diagnosis not present

## 2016-10-10 DIAGNOSIS — E039 Hypothyroidism, unspecified: Secondary | ICD-10-CM

## 2016-10-10 LAB — LIPID PANEL
Cholesterol: 135 mg/dL (ref 0–200)
HDL: 48.2 mg/dL (ref >39.00)
LDL Cholesterol: 68 mg/dL (ref 0–99)
NonHDL: 87.03
Total CHOL/HDL Ratio: 3
Triglycerides: 93 mg/dL (ref 0.0–149.0)
VLDL: 18.6 mg/dL (ref 0.0–40.0)

## 2016-10-10 LAB — HEMOGLOBIN A1C: Hgb A1c MFr Bld: 6.1 % (ref 4.6–6.5)

## 2016-10-10 LAB — TSH: TSH: 3.58 u[IU]/mL (ref 0.35–4.50)

## 2016-10-10 LAB — VITAMIN D 25 HYDROXY (VIT D DEFICIENCY, FRACTURES): VITD: 15.53 ng/mL — ABNORMAL LOW (ref 30.00–100.00)

## 2016-10-17 ENCOUNTER — Encounter: Payer: Self-pay | Admitting: Primary Care

## 2016-10-17 ENCOUNTER — Ambulatory Visit (INDEPENDENT_AMBULATORY_CARE_PROVIDER_SITE_OTHER): Payer: Medicare Other | Admitting: Primary Care

## 2016-10-17 VITALS — BP 124/78 | HR 78 | Temp 98.1°F | Ht 59.0 in | Wt 182.0 lb

## 2016-10-17 DIAGNOSIS — F418 Other specified anxiety disorders: Secondary | ICD-10-CM

## 2016-10-17 DIAGNOSIS — Z23 Encounter for immunization: Secondary | ICD-10-CM

## 2016-10-17 DIAGNOSIS — E559 Vitamin D deficiency, unspecified: Secondary | ICD-10-CM

## 2016-10-17 DIAGNOSIS — Z0000 Encounter for general adult medical examination without abnormal findings: Secondary | ICD-10-CM | POA: Diagnosis not present

## 2016-10-17 DIAGNOSIS — R7303 Prediabetes: Secondary | ICD-10-CM | POA: Diagnosis not present

## 2016-10-17 DIAGNOSIS — E038 Other specified hypothyroidism: Secondary | ICD-10-CM

## 2016-10-17 DIAGNOSIS — J449 Chronic obstructive pulmonary disease, unspecified: Secondary | ICD-10-CM

## 2016-10-17 DIAGNOSIS — F411 Generalized anxiety disorder: Secondary | ICD-10-CM

## 2016-10-17 DIAGNOSIS — R0989 Other specified symptoms and signs involving the circulatory and respiratory systems: Secondary | ICD-10-CM

## 2016-10-17 MED ORDER — ALPRAZOLAM 0.25 MG PO TABS: 0.2500 mg | ORAL_TABLET | Freq: Every day | ORAL | 0 refills | Status: DC | PRN

## 2016-10-17 NOTE — Assessment & Plan Note (Signed)
Using Xanax sparingly, refill provided today.  Discussed that we can fill 20 tablets every 6 months, she verbalized understanding.

## 2016-10-17 NOTE — Assessment & Plan Note (Signed)
Currently taking calcium and vitamin D. Recent D level of 15. Will have her take an additional 1000 units once daily.

## 2016-10-17 NOTE — Progress Notes (Signed)
Patient ID: Teresa Jensen, female   DOB: 1945-11-28, 71 y.o.   MRN: [id number]  HPI: Teresa Jensen is a 71 year old female who presents today for [place].  Past Medical History:  Diagnosis Date  . Anxiety   . COPD (chronic obstructive pulmonary disease) ([place])   . GERD (gastroesophageal reflux disease)   . New onset of headaches   . Osteopenia   . PMR (polymyalgia rheumatica) ([facility])   . Strain of chest wall 01/20/2011  . Tobacco abuse     Current Outpatient Prescriptions  Medication Sig Dispense Refill  . albuterol (PROVENTIL HFA;VENTOLIN HFA) 108 (90 Base) MCG/ACT inhaler Inhale 2 puffs into the lungs every 6 (six) hours as needed for wheezing or shortness of breath. 1 Inhaler 1  . aspirin 81 MG tablet Take 81 mg by mouth every other day.     . Calcium Citrate-Vitamin D (GNP CALCIUM CITRATE +D3 PO) Take 1 tablet by mouth daily.    Marland Kitchen levothyroxine (SYNTHROID, LEVOTHROID) 75 MCG tablet TAKE 1 TABLET (75 MCG TOTAL) BY MOUTH DAILY. 90 tablet 3  . ALPRAZolam (XANAX) 0.25 MG tablet Take 1 tablet (0.25 mg total) by mouth daily as needed for anxiety. (Patient not taking: Reported on 10/17/2016) 20 tablet 0   No current facility-administered medications for this visit.     Allergies  Allergen Reactions  . Food     MANGO  . Prednisone     REACTION: hives, difficulty breathing, facial swelling.   Marland Kitchen Prevnar 13 [Pneumococcal 13-Val Conj Vacc]     Family History  Problem Relation Age of Onset  . Heart disease Mother   . Cancer Father        lung/chest wall   . Prostate cancer Father   . Diabetes Brother        siblings  . Hypertension Sister        x 2  . Heart disease Brother   . Breast cancer Paternal Grandmother   . Heart disease Sister   . Diabetes Sister   . Tuberculosis Sister   . Cirrhosis Brother     Social History   Social History  . Marital status: Married    Spouse name: N/A  . Number of children: 1  . Years of education: N/A   Occupational History  . [employer]  Pathology as a Forensic psychologist Retired    retired   Social History Main Topics  . Smoking status: Former Smoker    Packs/day: 1.50    Years: 40.00    Types: Cigarettes    Quit date: 02/11/2009  . Smokeless tobacco: Never Used  . Alcohol use No  . Drug use: No  . Sexual activity: No   Other Topics Concern  . Not on file   Social History Narrative   Lives in [place] with her husband.  Takes care of her two grandchildren, ages 9 and 2, everyday in [place].  Has one son who is 60.   Desires CPR   Would not want prolonged life support if futile    Hospitiliaztions: None  Health Maintenance:    Flu: Due today.  Tetanus: Completed in 2010  Pneumovax: Allergy  Prevnar: Completed in 2016, allergic reaction.  Zostavax: Insurance won't cover.  Bone Density: Completed in 2017, osteopenia.   Colonoscopy: Completed in 2013  Eye Doctor: Completed in 2017  Dental Exam: Completes annually   Mammogram: Completed in August 2018  Pap: Hysterectomy   Hep C Screening: Negative    Providers: Belenda Cruise  Carlis Abbott, PCP; orthopedics; optometry.    I have personally reviewed and have noted: 1. The patient's medical and social history 2. Their use of alcohol, tobacco or illicit drugs 3. Their current medications and supplements 4. The patient's functional ability including ADL's, fall risks, home safety risks and  hearing or visual impairment. 5. Diet and physical activities 6. Evidence for depression or mood disorder  Subjective:   Review of Systems:   Constitutional: Denies fever, malaise, fatigue, headache or abrupt weight changes.  HEENT: Denies eye pain, eye redness, ear pain, ringing in the ears, wax buildup, runny nose, nasal congestion, bloody nose, or sore throat. Respiratory: Denies difficulty breathing, shortness of breath, cough or sputum production.   Cardiovascular: Denies chest pain, chest tightness, palpitations or swelling in the hands or feet.  Gastrointestinal: Denies  abdominal pain, bloating, constipation, diarrhea or blood in the stool.  GU: Denies urgency, frequency, pain with urination, burning sensation, blood in urine, odor or discharge. Musculoskeletal: Continued pain to left dorsal foot from injury. Following with orthopedics.   Skin: Denies redness, rashes, lesions or ulcercations.  Neurological: Denies dizziness, difficulty with memory, difficulty with speech or problems with balance and coordination.  Psychiatric: Situational anxiety with her grandson, using Xanax sparingly. Needing refill today.  No other specific complaints in a complete review of systems (except as listed in HPI above).  Objective:  PE:   BP 124/78   Pulse 78   Temp 98.1 F (36.7 C) (Oral)   Ht 4' 11"  (1.499 m)   Wt 182 lb (82.6 kg)   SpO2 94%   BMI 36.76 kg/m  Wt Readings from Last 3 Encounters:  10/17/16 182 lb (82.6 kg)  09/27/16 183 lb 12 oz (83.3 kg)  08/24/16 180 lb 12.8 oz (82 kg)    General: Appears their stated age, well developed, well nourished in NAD. Skin: Warm, dry and intact. No rashes, lesions or ulcerations noted. HEENT: Head: normal shape and size; Eyes: sclera white, no icterus, conjunctiva pink, PERRLA and EOMs intact; Ears: Tm's gray and intact, normal light reflex; Nose: mucosa pink and moist, septum midline; Throat/Mouth: Teeth present, mucosa pink and moist, no exudate, lesions or ulcerations noted.  Neck: Normal range of motion. Neck supple, trachea midline. No massses, lumps or thyromegaly present.  Cardiovascular: Normal rate and rhythm. S1,S2 noted.  No murmur, rubs or gallops noted. No JVD or BLE edema. Bilateral carotid bruits noted. Pulmonary/Chest: Normal effort and positive vesicular breath sounds. No respiratory distress. No wheezes, rales or ronchi noted.  Abdomen: Soft and nontender. Normal bowel sounds, no bruits noted. No distention or masses noted. Liver, spleen and kidneys non palpable. Musculoskeletal: Left foot wrapped with  bandage. Decrease in ROM but ambulatory.  Neurological: Alert and oriented. Cranial nerves II-XII intact. Coordination normal. +DTRs bilaterally. Psychiatric: Mood and affect normal. Behavior is normal. Judgment and thought content normal.     BMET    Component Value Date/Time   NA 142 09/20/2016 0856   K 4.4 09/20/2016 0856   CL 104 09/20/2016 0856   CO2 31 09/20/2016 0856   GLUCOSE 149 (H) 09/20/2016 0856   BUN 10 09/20/2016 0856   CREATININE 0.67 09/20/2016 0856   CALCIUM 9.6 09/20/2016 0856   GFRNONAA >60 03/28/2008 2301   GFRAA  03/28/2008 2301    >60        The eGFR has been calculated using the MDRD equation. This calculation has not been validated in all clinical situations. eGFR's persistently <60 mL/min signify possible  Chronic Kidney Disease.    Lipid Panel     Component Value Date/Time   CHOL 135 10/10/2016 0830   TRIG 93.0 10/10/2016 0830   HDL 48.20 10/10/2016 0830   CHOLHDL 3 10/10/2016 0830   VLDL 18.6 10/10/2016 0830   LDLCALC 68 10/10/2016 0830    CBC    Component Value Date/Time   WBC 9.4 09/20/2016 0856   RBC 4.94 09/20/2016 0856   HGB 14.8 09/20/2016 0856   HCT 45.4 09/20/2016 0856   PLT 321.0 09/20/2016 0856   MCV 91.9 09/20/2016 0856   MCHC 32.6 09/20/2016 0856   RDW 14.4 09/20/2016 0856   LYMPHSABS 3.0 06/17/2012 0923   MONOABS 1.0 06/17/2012 0923   EOSABS 0.1 06/17/2012 0923   BASOSABS 0.1 06/17/2012 0923    Hgb A1C Lab Results  Component Value Date   HGBA1C 6.1 10/10/2016      Assessment and Plan:   Medicare Annual Wellness Visit:  Diet: She endorses a fair diet. Breakfast: Doughnuts  Lunch: Skips Dinner: Meat, vegetables Snacks: None Desserts: Twice weekly  Beverages: Water, coffee Physical activity: Sedentary Depression/mood screen: Negative Hearing: Intact to whispered voice Visual acuity: Grossly normal, performs annual eye exam  ADLs: Capable Fall risk: Mild. Home safety: Good Cognitive evaluation:  Intact to orientation, naming, recall and repetition EOL planning: Adv directives not completed, packet provided. Full code/ I agree  Preventative Medicine: Immunizations UTD, influenza vaccination provided today. Allergic to pneumonia vaccination. Bone Density and Mammogram UTD. Recommended to increase vegetables, fruit, whole grains. Recommended regular activity once her foot heals. All recommendations provided at end of visit.  Exam with new finding of carotid bruits, carotid dopplers ordered. Labs overall stable and were addressed. Advanced directives packet provided.  Next appointment: one year or sooner

## 2016-10-17 NOTE — Assessment & Plan Note (Addendum)
Recent A1C of 6.1, increase from 5.7 one year ago. Discussed the importance of a healthy diet and regular exercise in order for weight loss, and to reduce the risk of other medical problems. Continue to monitor.

## 2016-10-17 NOTE — Progress Notes (Signed)
   Subjective:    Patient ID: Teresa Jensen, female    DOB: [birth year], 71 y.o.   MRN: [id number]  HPI  Teresa Jensen is a 71 year old female who presents today for [place] Part 2. She saw our health advisor last week.  1) Hypothyroidism: Currently managed on levothyroxine 75 mcg. Her recent TSH was 3.58.   2) Situational Anxiety: Currently managed on Alprazolam 0.25 mg for which she uses sparingly. Her last Rx is out. This was prescribed in October 2017 for #20. She would like refills today.  3) Prediabetes: Recent A1C of 6.1.   Diet currently consists of:  Breakfast: Doughnuts  Lunch: Skips mostly Dinner: Meat, vegetables (baked, broiled, grilled) Snacks: None Desserts: Twice weekly Beverages: Water, coffee  Exercise: She is not exercising   Review of Systems     Objective:   Physical Exam        Assessment & Plan:

## 2016-10-17 NOTE — Assessment & Plan Note (Signed)
Recent TSH stable, continue levothyroxine 75 mcg. 

## 2016-10-17 NOTE — Assessment & Plan Note (Signed)
Using albuterol once weekly, sometimes less.  Denies SOB with mild exertion.

## 2016-10-17 NOTE — Assessment & Plan Note (Signed)
Immunizations UTD, influenza vaccination provided today. Allergic to pneumonia vaccination. Bone Density and Mammogram UTD. Recommended to increase vegetables, fruit, whole grains. Recommended regular activity once her foot heals. All recommendations provided at end of visit.  Exam with new finding of carotid bruits, carotid dopplers ordered. Labs overall stable and were addressed. Advanced directives packet provided.  I have personally reviewed and have noted: 1. The patient's medical and social history 2. Their use of alcohol, tobacco or illicit drugs 3. Their current medications and supplements 4. The patient's functional ability including ADL's, fall  risks, home safety risks and  hearing or visual  impairment. 5. Diet and physical activities 6. Evidence for depression or mood disorder

## 2016-10-17 NOTE — Patient Instructions (Signed)
Start Vitamin D 1000 units once daily. Continue Calcium and Vitamin D.  You will be contacted regarding your carotid doppler ultrasound.  Please let us know if you have not heard back within one week.   Reduce sweets. Increase vegetables, whole grains, water.  Use the Xanax sparingly as needed for anxiety.   Follow up in 1 year for your annual exam or sooner if needed.  It was a pleasure to see you today!

## 2016-10-17 NOTE — Addendum Note (Signed)
Addended by: Jacqualin Combes on: 10/17/2016 12:48 PM   Modules accepted: Orders

## 2016-10-18 ENCOUNTER — Encounter: Payer: Self-pay | Admitting: Primary Care

## 2016-10-22 ENCOUNTER — Encounter: Payer: Self-pay | Admitting: Primary Care

## 2016-10-22 DIAGNOSIS — R200 Anesthesia of skin: Secondary | ICD-10-CM | POA: Insufficient documentation

## 2016-10-22 DIAGNOSIS — R29898 Other symptoms and signs involving the musculoskeletal system: Secondary | ICD-10-CM | POA: Insufficient documentation

## 2016-10-23 ENCOUNTER — Ambulatory Visit (INDEPENDENT_AMBULATORY_CARE_PROVIDER_SITE_OTHER): Payer: Medicare Other

## 2016-10-23 DIAGNOSIS — R0989 Other specified symptoms and signs involving the circulatory and respiratory systems: Secondary | ICD-10-CM

## 2016-10-23 LAB — VAS US CAROTID
LEFT ECA DIAS: 13 cm/s
LEFT VERTEBRAL DIAS: 19 cm/s
Left CCA dist dias: -17 cm/s
Left CCA dist sys: -72 cm/s
Left CCA prox dias: 22 cm/s
Left CCA prox sys: 93 cm/s
Left ICA dist dias: -26 cm/s
Left ICA dist sys: -113 cm/s
Left ICA prox dias: -37 cm/s
Left ICA prox sys: -152 cm/s
RIGHT ECA DIAS: 12 cm/s
RIGHT VERTEBRAL DIAS: 15 cm/s
Right CCA prox dias: -9 cm/s
Right CCA prox sys: -54 cm/s
Right cca dist sys: -109 cm/s

## 2016-12-05 ENCOUNTER — Other Ambulatory Visit: Payer: Self-pay | Admitting: Family Medicine

## 2016-12-05 DIAGNOSIS — E038 Other specified hypothyroidism: Secondary | ICD-10-CM

## 2016-12-05 NOTE — Telephone Encounter (Signed)
Refills sent to pharmacy. 

## 2016-12-05 NOTE — Telephone Encounter (Signed)
Patient of Belenda Cruise Clark/thx dmf

## 2016-12-06 ENCOUNTER — Encounter: Payer: Self-pay | Admitting: Primary Care

## 2016-12-13 ENCOUNTER — Ambulatory Visit (INDEPENDENT_AMBULATORY_CARE_PROVIDER_SITE_OTHER): Payer: Medicare Other | Admitting: Internal Medicine

## 2016-12-13 ENCOUNTER — Encounter: Payer: Self-pay | Admitting: Internal Medicine

## 2016-12-13 VITALS — BP 136/94 | HR 86 | Temp 98.5°F | Wt 182.0 lb

## 2016-12-13 DIAGNOSIS — J069 Acute upper respiratory infection, unspecified: Secondary | ICD-10-CM

## 2016-12-13 DIAGNOSIS — B9789 Other viral agents as the cause of diseases classified elsewhere: Secondary | ICD-10-CM

## 2016-12-13 MED ORDER — HYDROCOD POLST-CPM POLST ER 10-8 MG/5ML PO SUER: 5.0000 mL | Freq: Every evening | ORAL | 0 refills | Status: DC | PRN

## 2016-12-13 NOTE — Patient Instructions (Signed)
Upper Respiratory Infection, Adult Most upper respiratory infections (URIs) are caused by a virus. A URI affects the nose, throat, and upper air passages. The most common type of URI is often called "the common cold." Follow these instructions at home:  Take medicines only as told by your doctor.  Gargle warm saltwater or take cough drops to comfort your throat as told by your doctor.  Use a warm mist humidifier or inhale steam from a shower to increase air moisture. This may make it easier to breathe.  Drink enough fluid to keep your pee (urine) clear or pale yellow.  Eat soups and other clear broths.  Have a healthy diet.  Rest as needed.  Go back to work when your fever is gone or your doctor says it is okay. ? You may need to stay home longer to avoid giving your URI to others. ? You can also wear a face mask and wash your hands often to prevent spread of the virus.  Use your inhaler more if you have asthma.  Do not use any tobacco products, including cigarettes, chewing tobacco, or electronic cigarettes. If you need help quitting, ask your doctor. Contact a doctor if:  You are getting worse, not better.  Your symptoms are not helped by medicine.  You have chills.  You are getting more short of breath.  You have brown or red mucus.  You have yellow or brown discharge from your nose.  You have pain in your face, especially when you bend forward.  You have a fever.  You have puffy (swollen) neck glands.  You have pain while swallowing.  You have white areas in the back of your throat. Get help right away if:  You have very bad or constant: ? Headache. ? Ear pain. ? Pain in your forehead, behind your eyes, and over your cheekbones (sinus pain). ? Chest pain.  You have long-lasting (chronic) lung disease and any of the following: ? Wheezing. ? Long-lasting cough. ? Coughing up blood. ? A change in your usual mucus.  You have a stiff neck.  You have  changes in your: ? Vision. ? Hearing. ? Thinking. ? Mood. This information is not intended to replace advice given to you by your health care provider. Make sure you discuss any questions you have with your health care provider. Document Released: 06/06/2007 Document Revised: 08/21/2015 Document Reviewed: 03/25/2013 Elsevier Interactive Patient Education  2018 Elsevier Inc[employer]  

## 2016-12-13 NOTE — Progress Notes (Signed)
HPI  Pt presents to the clinic today with c/o ear pain, sore throat and cough. This started 3 days ago. She denies decreased hearing or difficulty swallowing. The cough is productive of blood tinged clear mucous. She denies fever, chills or body aches. She has tried Tylenol OTC with minimal relief. She has a history of COPD. She has had sick contacts.  Review of Systems      Past Medical History:  Diagnosis Date  . Anxiety   . COPD (chronic obstructive pulmonary disease) ([place])   . GERD (gastroesophageal reflux disease)   . New onset of headaches   . Osteopenia   . PMR (polymyalgia rheumatica) ([facility])   . Strain of chest wall 01/20/2011  . Tobacco abuse     Family History  Problem Relation Age of Onset  . Heart disease Mother   . Cancer Father        lung/chest wall   . Prostate cancer Father   . Diabetes Brother        siblings  . Hypertension Sister        x 2  . Heart disease Brother   . Breast cancer Paternal Grandmother   . Heart disease Sister   . Diabetes Sister   . Tuberculosis Sister   . Cirrhosis Brother     Social History   Socioeconomic History  . Marital status: Married    Spouse name: Not on file  . Number of children: 1  . Years of education: Not on file  . Highest education level: Not on file  Social Needs  . Financial resource strain: Not on file  . Food insecurity - worry: Not on file  . Food insecurity - inability: Not on file  . Transportation needs - medical: Not on file  . Transportation needs - non-medical: Not on file  Occupational History  . Occupation: Therapist, art as a Therapist, art: RETIRED    Comment: retired  Tobacco Use  . Smoking status: Former Smoker    Packs/day: 1.50    Years: 40.00    Pack years: 60.00    Types: Cigarettes    Last attempt to quit: 02/11/2009    Years since quitting: 7.8  . Smokeless tobacco: Never Used  Substance and Sexual Activity  . Alcohol use: No    Alcohol/week: 0.0 oz  . Drug  use: No  . Sexual activity: No  Other Topics Concern  . Not on file  Social History Narrative   Lives in [place] with her husband.  Takes care of her two grandchildren, ages 87 and 2, everyday in [place].  Has one son who is 40.   Desires CPR   Would not want prolonged life support if futile    Allergies  Allergen Reactions  . Prednisone Itching and Anaphylaxis    REACTION: hives, difficulty breathing, facial swelling.  REACTION: hives, difficulty breathing, facial swelling.   . Food     MANGO  . Prevnar 13 [Pneumococcal 13-Val Conj Vacc]      Constitutional:  Denies headache, fatigue, fever or abrupt weight changes.  HEENT:  Positive ear pain, sore throat. Denies eye redness, eye pain, pressure behind the eyes, facial pain, nasal congestion, ringing in the ears, wax buildup, runny nose or bloody nose. Respiratory: Positive cough. Denies difficulty breathing or shortness of breath.  Cardiovascular: Denies chest pain, chest tightness, palpitations or swelling in the hands or feet.   No other specific complaints in a complete review of  systems (except as listed in HPI above).  Objective:   BP (!) 136/94   Pulse 86   Temp 98.5 F (36.9 C) (Oral)   Wt 182 lb (82.6 kg)   SpO2 94%   BMI 36.76 kg/m  Wt Readings from Last 3 Encounters:  12/13/16 182 lb (82.6 kg)  10/17/16 182 lb (82.6 kg)  09/27/16 183 lb 12 oz (83.3 kg)     General: Appears her stated age, ill appearing, in NAD. HEENT: Head: normal shape and size, no sinus tenderness noted; Ears: Tm's gray and intact, normal light reflex; Nose: mucosa pink and moist, septum midline; Throat/Mouth: + PND. Teeth present, mucosa pink and moist, no exudate noted, no lesions or ulcerations noted.  Neck: No cervical lymphadenopathy.  Cardiovascular: Normal rate and rhythm.  Pulmonary/Chest: Normal effort and diminshed breath sounds. No respiratory distress. No wheezes, rales or ronchi noted.       Assessment & Plan:   Viral  Upper Respiratory Infection with Cough:  Get some rest and drink plenty of water Do salt water gargles for the sore throat 80 mg Depo IM today Try Mucinex 600 mg every 12 hours Rx for Tussionex cough syrup  RTC as needed or if symptoms persist.   Webb Silversmith, NP

## 2017-01-06 ENCOUNTER — Emergency Department (HOSPITAL_COMMUNITY): Payer: Medicare Other

## 2017-01-06 ENCOUNTER — Observation Stay (HOSPITAL_BASED_OUTPATIENT_CLINIC_OR_DEPARTMENT_OTHER)
Admission: EM | Admit: 2017-01-06 | Discharge: 2017-01-07 | Disposition: A | Discharge status: Home / Self Care | Payer: Medicare Other | Source: Home / Self Care | Attending: Emergency Medicine | Admitting: Emergency Medicine

## 2017-01-06 ENCOUNTER — Encounter (HOSPITAL_COMMUNITY): Payer: Self-pay | Admitting: Emergency Medicine

## 2017-01-06 DIAGNOSIS — Z888 Allergy status to other drugs, medicaments and biological substances status: Secondary | ICD-10-CM

## 2017-01-06 DIAGNOSIS — E039 Hypothyroidism, unspecified: Secondary | ICD-10-CM | POA: Diagnosis present

## 2017-01-06 DIAGNOSIS — M8580 Other specified disorders of bone density and structure, unspecified site: Secondary | ICD-10-CM

## 2017-01-06 DIAGNOSIS — Z79899 Other long term (current) drug therapy: Secondary | ICD-10-CM

## 2017-01-06 DIAGNOSIS — R000 Tachycardia, unspecified: Secondary | ICD-10-CM | POA: Insufficient documentation

## 2017-01-06 DIAGNOSIS — M353 Polymyalgia rheumatica: Secondary | ICD-10-CM

## 2017-01-06 DIAGNOSIS — R0902 Hypoxemia: Secondary | ICD-10-CM

## 2017-01-06 DIAGNOSIS — K219 Gastro-esophageal reflux disease without esophagitis: Secondary | ICD-10-CM | POA: Insufficient documentation

## 2017-01-06 DIAGNOSIS — Z87891 Personal history of nicotine dependence: Secondary | ICD-10-CM | POA: Insufficient documentation

## 2017-01-06 DIAGNOSIS — M1990 Unspecified osteoarthritis, unspecified site: Secondary | ICD-10-CM | POA: Insufficient documentation

## 2017-01-06 DIAGNOSIS — Z8249 Family history of ischemic heart disease and other diseases of the circulatory system: Secondary | ICD-10-CM

## 2017-01-06 DIAGNOSIS — Z7982 Long term (current) use of aspirin: Secondary | ICD-10-CM | POA: Insufficient documentation

## 2017-01-06 DIAGNOSIS — R040 Epistaxis: Secondary | ICD-10-CM | POA: Diagnosis present

## 2017-01-06 DIAGNOSIS — I700 Atherosclerosis of aorta: Secondary | ICD-10-CM | POA: Insufficient documentation

## 2017-01-06 DIAGNOSIS — F419 Anxiety disorder, unspecified: Secondary | ICD-10-CM

## 2017-01-06 DIAGNOSIS — R030 Elevated blood-pressure reading, without diagnosis of hypertension: Secondary | ICD-10-CM | POA: Diagnosis present

## 2017-01-06 DIAGNOSIS — J449 Chronic obstructive pulmonary disease, unspecified: Secondary | ICD-10-CM | POA: Insufficient documentation

## 2017-01-06 DIAGNOSIS — I160 Hypertensive urgency: Secondary | ICD-10-CM | POA: Diagnosis not present

## 2017-01-06 DIAGNOSIS — R7303 Prediabetes: Secondary | ICD-10-CM | POA: Insufficient documentation

## 2017-01-06 DIAGNOSIS — D649 Anemia, unspecified: Secondary | ICD-10-CM

## 2017-01-06 DIAGNOSIS — Z91018 Allergy to other foods: Secondary | ICD-10-CM

## 2017-01-06 DIAGNOSIS — E559 Vitamin D deficiency, unspecified: Secondary | ICD-10-CM | POA: Insufficient documentation

## 2017-01-06 LAB — CBC WITH DIFFERENTIAL/PLATELET
Basophils Absolute: 0.1 10*3/uL (ref 0.0–0.1)
Basophils Absolute: 0 10*3/uL (ref 0.0–0.1)
Basophils Relative: 0 %
Basophils Relative: 0 %
Eosinophils Absolute: 0.1 10*3/uL (ref 0.0–0.7)
Eosinophils Absolute: 0 10*3/uL (ref 0.0–0.7)
Eosinophils Relative: 0 %
Eosinophils Relative: 0 %
HCT: 43.8 % (ref 36.0–46.0)
HCT: 38.3 % (ref 36.0–46.0)
Hemoglobin: 13.7 g/dL (ref 12.0–15.0)
Hemoglobin: 12 g/dL (ref 12.0–15.0)
Lymphocytes Relative: 14 %
Lymphocytes Relative: 12 %
Lymphs Abs: 2.2 10*3/uL (ref 0.7–4.0)
Lymphs Abs: 1.5 10*3/uL (ref 0.7–4.0)
MCH: 29.3 pg (ref 26.0–34.0)
MCH: 29.6 pg (ref 26.0–34.0)
MCHC: 31.3 g/dL (ref 30.0–36.0)
MCHC: 31.3 g/dL (ref 30.0–36.0)
MCV: 93.6 fL (ref 78.0–100.0)
MCV: 94.3 fL (ref 78.0–100.0)
Monocytes Absolute: 1.4 10*3/uL — ABNORMAL HIGH (ref 0.1–1.0)
Monocytes Absolute: 0.7 10*3/uL (ref 0.1–1.0)
Monocytes Relative: 9 %
Monocytes Relative: 6 %
Neutro Abs: 11.9 10*3/uL — ABNORMAL HIGH (ref 1.7–7.7)
Neutro Abs: 10 10*3/uL — ABNORMAL HIGH (ref 1.7–7.7)
Neutrophils Relative %: 77 %
Neutrophils Relative %: 82 %
Platelets: 306 10*3/uL (ref 150–400)
Platelets: 272 10*3/uL (ref 150–400)
RBC: 4.68 MIL/uL (ref 3.87–5.11)
RBC: 4.06 MIL/uL (ref 3.87–5.11)
RDW: 14.1 % (ref 11.5–15.5)
RDW: 14.5 % (ref 11.5–15.5)
WBC: 15.6 10*3/uL — ABNORMAL HIGH (ref 4.0–10.5)
WBC: 12.2 10*3/uL — ABNORMAL HIGH (ref 4.0–10.5)

## 2017-01-06 MED ORDER — METOCLOPRAMIDE HCL 5 MG/ML IJ SOLN
10.0000 mg | Freq: Once | INTRAMUSCULAR | Status: AC
  Administered 2017-01-06: 10 mg via INTRAVENOUS
  Filled 2017-01-06: qty 2

## 2017-01-06 MED ORDER — ONDANSETRON HCL 4 MG/2ML IJ SOLN
4.0000 mg | Freq: Once | INTRAMUSCULAR | Status: AC
Start: 2017-01-06 — End: 2017-01-06
  Administered 2017-01-06: 4 mg via INTRAVENOUS
  Filled 2017-01-06: qty 2

## 2017-01-06 MED ORDER — SODIUM CHLORIDE 0.9 % IV BOLUS (SEPSIS)
1000.0000 mL | Freq: Once | INTRAVENOUS | Status: AC
  Administered 2017-01-06: 1000 mL via INTRAVENOUS

## 2017-01-06 MED ORDER — FENTANYL CITRATE (PF) 100 MCG/2ML IJ SOLN
50.0000 ug | Freq: Once | INTRAMUSCULAR | Status: AC
  Administered 2017-01-06: 50 ug via INTRAVENOUS
  Filled 2017-01-06: qty 2

## 2017-01-06 MED ORDER — ONDANSETRON HCL 4 MG/2ML IJ SOLN
4.0000 mg | Freq: Once | INTRAMUSCULAR | Status: AC
  Administered 2017-01-06: 4 mg via INTRAVENOUS
  Filled 2017-01-06: qty 2

## 2017-01-06 MED ORDER — TRANEXAMIC ACID 1000 MG/10ML IV SOLN
500.0000 mg | Freq: Once | INTRAVENOUS | Status: AC
  Administered 2017-01-06: 500 mg via TOPICAL
  Filled 2017-01-06 (×3): qty 10

## 2017-01-06 MED ORDER — PHENYLEPHRINE HCL 0.5 % NA SOLN
2.0000 [drp] | Freq: Once | NASAL | Status: AC
  Administered 2017-01-06: 2 [drp] via NASAL
  Filled 2017-01-06: qty 15

## 2017-01-06 NOTE — ED Notes (Signed)
Patient assisted to restroom in wheelchair. Sitting in chair in room at this time for comfort. Denies dizziness.

## 2017-01-06 NOTE — ED Notes (Signed)
Patient called out, stating that her nose was bleeding again. About 100cc blood noted in emesis bag since it started back. MD aware.

## 2017-01-06 NOTE — ED Provider Notes (Signed)
[facility] Provider Note   CSN: [id number] Arrival date & time: 01/06/17  1554     History   Chief Complaint Chief Complaint  Patient presents with  . Epistaxis    HPI Teresa Jensen is a 72 y.o. female.  The history is provided by the patient.  Epistaxis   This is a new problem. The current episode started 1 to 2 hours ago. The problem occurs constantly. The problem has been gradually improving. The problem is associated with an unknown factor. Nostril bleeding: unable to determine. She has tried nothing for the symptoms. Her past medical history does not include bleeding disorder or frequent nosebleeds.    Past Medical History:  Diagnosis Date  . Anxiety   . COPD (chronic obstructive pulmonary disease) ([place])   . GERD (gastroesophageal reflux disease)   . New onset of headaches   . Osteopenia   . PMR (polymyalgia rheumatica) ([facility])   . Strain of chest wall 01/20/2011  . Tobacco abuse     Patient Active Problem List   Diagnosis Date Noted  . Prediabetes 10/17/2015  . Herpes zoster 07/08/2015  . Pain of right lower extremity 05/20/2015  . 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 Medicare annual wellness visit, subsequent 10/12/2014  . Occipital neuralgia 10/12/2014  . Vitamin D deficiency 10/12/2014  . COPD (chronic obstructive pulmonary disease) ([place]) 06/05/2010  . Hypothyroidism 09/29/2009  . Situational anxiety 10/01/2008  . GERD 10/01/2008  . Osteoarthritis 10/01/2008    Past Surgical History:  Procedure Laterality Date  . ABDOMINAL HYSTERECTOMY    . APPENDECTOMY    . CERVICAL FUSION    . CHOLECYSTECTOMY    . HEMORRHOID SURGERY    . LUMBAR FUSION    . TUBAL LIGATION      OB History    No data available       Home Medications    Prior to Admission medications   Medication Sig Start Date End Date Taking? Authorizing Provider  levothyroxine (SYNTHROID, LEVOTHROID) 75 MCG tablet Take 1 tablet by mouth every  morning on empty stomach with a full glass of water. 12/05/16  Yes Pleas Koch, NP  albuterol (PROVENTIL HFA;VENTOLIN HFA) 108 (90 Base) MCG/ACT inhaler Inhale 2 puffs into the lungs every 6 (six) hours as needed for wheezing or shortness of breath. Patient not taking: Reported on 01/06/2017 05/20/15   Pleas Koch, NP  ALPRAZolam Duanne Moron) 0.25 MG tablet Take 1 tablet (0.25 mg total) by mouth daily as needed for anxiety. Patient not taking: Reported on 01/06/2017 10/17/16   Pleas Koch, NP  chlorpheniramine-HYDROcodone [facility] PENNKINETIC ER) 10-8 MG/5ML SUER Take 5 mLs by mouth at bedtime as needed for cough. Patient not taking: Reported on 01/06/2017 12/13/16   Jearld Fenton, NP    Family History Family History  Problem Relation Age of Onset  . Heart disease Mother   . Cancer Father        lung/chest wall   . Prostate cancer Father   . Diabetes Brother        siblings  . Hypertension Sister        x 2  . Heart disease Brother   . Breast cancer Paternal Grandmother   . Heart disease Sister   . Diabetes Sister   . Tuberculosis Sister   . Cirrhosis Brother     Social History Social History   Tobacco Use  . Smoking status: Former Smoker    Packs/day: 1.50  Years: 40.00    Pack years: 60.00    Types: Cigarettes    Last attempt to quit: 02/11/2009    Years since quitting: 7.9  . Smokeless tobacco: Never Used  Substance Use Topics  . Alcohol use: No    Alcohol/week: 0.0 oz  . Drug use: No     Allergies   Prednisone; Prevnar 13 [pneumococcal 13-val conj vacc]; and Food   Review of Systems Review of Systems  Constitutional: Negative for chills and fever.  HENT: Positive for nosebleeds. Negative for sore throat.   Eyes: Negative for visual disturbance.  Respiratory: Negative for cough and shortness of breath.   Cardiovascular: Negative for chest pain.  Gastrointestinal: Negative for nausea and vomiting.  Musculoskeletal: Negative for neck pain.    Skin: Negative for rash.  Allergic/Immunologic: Negative for immunocompromised state.  Neurological: Negative for light-headedness.  Hematological: Does not bruise/bleed easily.  Psychiatric/Behavioral: Negative for confusion.     Physical Exam Updated Vital Signs BP (!) 161/72   Pulse 92   Temp 98 F (36.7 C) (Temporal)   Resp 16   Ht 4\' 9"  (1.448 m)   Wt 81.6 kg (180 lb)   SpO2 96%   BMI 38.95 kg/m   Physical Exam  Constitutional: She is oriented to person, place, and time. She appears well-developed and well-nourished. No distress.  HENT:  Head: Normocephalic and atraumatic.  Blood in the oropharynx, dried blood in nares, unable to see the source of bleeding w/nasal speculum  Eyes: Conjunctivae are normal.  Neck: Neck supple.  Cardiovascular: Regular rhythm.  No murmur heard. Tachycardic  Pulmonary/Chest: Effort normal and breath sounds normal. No respiratory distress.  Abdominal: Soft. There is no tenderness.  Musculoskeletal: She exhibits no edema or deformity.  Neurological: She is alert and oriented to person, place, and time.  Skin: Skin is warm and dry.  Psychiatric: She has a normal mood and affect.  Nursing note and vitals reviewed.   ED Treatments / Results  Labs (all labs ordered are listed, but only abnormal results are displayed) Labs Reviewed  CBC WITH DIFFERENTIAL/PLATELET - Abnormal; Notable for the following components:      Result Value   WBC 15.6 (*)    Neutro Abs 11.9 (*)    Monocytes Absolute 1.4 (*)    All other components within normal limits  CBC WITH DIFFERENTIAL/PLATELET - Abnormal; Notable for the following components:   WBC 12.2 (*)    Neutro Abs 10.0 (*)    All other components within normal limits  BASIC METABOLIC PANEL    EKG  EKG Interpretation None       Radiology Dg Chest Portable 1 View  Result Date: 01/06/2017 CLINICAL DATA:  Hypoxia. EXAM: PORTABLE CHEST 1 VIEW COMPARISON:  Radiograph 10/03/2016 FINDINGS:  Unchanged heart size and mediastinal contours with aortic atherosclerosis. Chronic hyperinflation with increased bronchial thickening from prior exam. No pulmonary edema, focal airspace disease or pneumothorax. Postsurgical change in the lower cervical spine. IMPRESSION: Increased bronchial thickening.  Chronic hyperinflation. Electronically Signed   By: Jeb Levering M.D.   On: 01/06/2017 22:42    Procedures .Epistaxis Management Date/Time: 01/06/2017 11:14 PM Performed by: Jenny Reichmann, MD Authorized by: Sharlett Iles, MD   Consent:    Consent obtained:  Verbal   Consent given by:  Patient   Risks discussed:  Bleeding, nasal injury and pain   Alternatives discussed:  No treatment and alternative treatment Anesthesia (see MAR for exact dosages):    Anesthesia method:  Topical application   Topical anesthetic:  Lidocaine gel Procedure details:    Treatment site:  R posterior   Treatment method:  Nasal balloon   Treatment complexity:  Limited   Treatment episode: initial   Post-procedure details:    Assessment:  Bleeding stopped   Patient tolerance of procedure:  Tolerated well, no immediate complications   (including critical care time)  Medications Ordered in ED Medications  phenylephrine (NEO-SYNEPHRINE) 0.5 % nasal solution 2 drop (2 drops Each Nare Given 01/06/17 1639)  ondansetron (ZOFRAN) injection 4 mg (4 mg Intravenous Given 01/06/17 1622)  tranexamic acid (CYKLOKAPRON) injection 500 mg (500 mg Topical Given by Other 01/06/17 2101)  fentaNYL (SUBLIMAZE) injection 50 mcg (50 mcg Intravenous Given 01/06/17 1738)  ondansetron (ZOFRAN) injection 4 mg (4 mg Intravenous Given 01/06/17 1849)  sodium chloride 0.9 % bolus 1,000 mL (0 mLs Intravenous Stopped 01/06/17 2049)  metoCLOPramide (REGLAN) injection 10 mg (10 mg Intravenous Given 01/06/17 2004)  fentaNYL (SUBLIMAZE) injection 50 mcg (50 mcg Intravenous Given 01/06/17 2056)  sodium chloride 0.9 % bolus 1,000 mL (1,000 mLs  Intravenous New Bag/Given 01/06/17 2240)     Initial Impression / Assessment and Plan / ED Course  I have reviewed the triage vital signs and the nursing notes.  Pertinent labs & imaging results that were available during my care of the patient were reviewed by me and considered in my medical decision making (see chart for details).    Pt with presents with epistaxis. Says she was talking on the phone when she suddenly started bleeding from her mouth. She called 911, and when EMS arrived, they estimate that the Pt had bled ~750cc; the Pt says it has slowed tremendously since starting. She is not on a/c.  VS & exam as above. Unable to visualize the source of bleeding. Afrin spray administered.   On re-evaluation after Afrin, Pt's bleeding seems to have stopped.  She has remained persistently tachycardic while here, and when she stood to use the restroom, she felt lightheaded, nauseated, and hot. NS bolus given.  Labs remarkable for Hgb 13.7 on arrival & 12.0 3hrs later.  Pt desaturated to the high 80s, so CXR ordered & shows no acute abnormalities.  Pt's bleeding recurred, so TXA soaked Rhino Rocket inserted in right nare.  ENT consulted by phone & made aware of the Pt's issue; they agree w/management plan and will see her in the morning. Dr. Constance Holster says he is available for consultation overnight as needed.  Will admit the Pt to the hospitalist for further evaluation and treatment.  Final Clinical Impressions(s) / ED Diagnoses   Final diagnoses:  Epistaxis  Symptomatic anemia    ED Discharge Orders    None       Jenny Reichmann, MD 01/06/17 2315    Rex Kras Wenda Overland, MD 01/07/17 2035

## 2017-01-06 NOTE — ED Notes (Signed)
RN assisted patient up to bedside commode. Upon getting back in bed, patient stated that she felt hot, nauseated, and weak. MD aware. Bleeding appears to have slowed down - holding on nasal packing at this time.

## 2017-01-06 NOTE — ED Notes (Signed)
Patient resting in bed, NAD noted. Bleeding appears to be controlled at this time.

## 2017-01-06 NOTE — ED Notes (Addendum)
R nostril successfully packed by MD.

## 2017-01-06 NOTE — ED Notes (Signed)
Following administration of phenylephrine nasal spray, bleeding increased - patient spitting out numerous blood clots. Airway remains intact, patient's respirations e/u but still tachypneic.

## 2017-01-06 NOTE — ED Notes (Signed)
MD at bedside. 

## 2017-01-06 NOTE — ED Notes (Signed)
ENT cart at bedside. Pharmacy to resend TXA.

## 2017-01-06 NOTE — ED Triage Notes (Signed)
Per [facility]: Pt to ED c/o hemoptysis x 1 hour. Patient was talking on the phone and began bleeding from her mouth. Upon EMS arrival, she was bleeding from her mouth faster than she could spit out. Patient currently bleeding (slowly) from nose and spitting out bright red blood clots. Estimated blood loss ~750cc per EMS. Patient states, "it feels like it's running down from my nose and into my throat." Airway intact, respirations e/u. EMS VS: 162/100, HR 120s, R 20, 99% RA. Pt not on blood thinners. 18g. R wrist. A&O x 4. Denies dizziness.

## 2017-01-06 NOTE — ED Notes (Signed)
Patient placed on 2L O2 [place] d/t SpO2 decreasing to upper 80s on RA. MD aware.

## 2017-01-07 ENCOUNTER — Encounter (HOSPITAL_COMMUNITY): Payer: Self-pay | Admitting: Internal Medicine

## 2017-01-07 ENCOUNTER — Ambulatory Visit: Payer: Medicare Other | Admitting: Podiatry

## 2017-01-07 DIAGNOSIS — R040 Epistaxis: Secondary | ICD-10-CM | POA: Diagnosis not present

## 2017-01-07 DIAGNOSIS — R030 Elevated blood-pressure reading, without diagnosis of hypertension: Secondary | ICD-10-CM

## 2017-01-07 DIAGNOSIS — E038 Other specified hypothyroidism: Secondary | ICD-10-CM

## 2017-01-07 LAB — BASIC METABOLIC PANEL
Anion gap: 9 (ref 5–15)
Anion gap: 7 (ref 5–15)
BUN: 15 mg/dL (ref 6–20)
BUN: 22 mg/dL — ABNORMAL HIGH (ref 6–20)
CO2: 28 mmol/L (ref 22–32)
CO2: 24 mmol/L (ref 22–32)
Calcium: 8.9 mg/dL (ref 8.9–10.3)
Calcium: 7.9 mg/dL — ABNORMAL LOW (ref 8.9–10.3)
Chloride: 101 mmol/L (ref 101–111)
Chloride: 108 mmol/L (ref 101–111)
Creatinine, Ser: 0.69 mg/dL (ref 0.44–1.00)
Creatinine, Ser: 0.52 mg/dL (ref 0.44–1.00)
GFR calc Af Amer: >60 mL/min (ref >60)
GFR calc Af Amer: >60 mL/min (ref >60)
GFR calc non Af Amer: >60 mL/min (ref >60)
GFR calc non Af Amer: >60 mL/min (ref >60)
Glucose, Bld: 190 mg/dL — ABNORMAL HIGH (ref 65–99)
Glucose, Bld: 119 mg/dL — ABNORMAL HIGH (ref 65–99)
Potassium: 4 mmol/L (ref 3.5–5.1)
Potassium: 4.3 mmol/L (ref 3.5–5.1)
Sodium: 138 mmol/L (ref 135–145)
Sodium: 139 mmol/L (ref 135–145)

## 2017-01-07 LAB — CBC
HCT: 35.2 % — ABNORMAL LOW (ref 36.0–46.0)
HCT: 36.6 % (ref 36.0–46.0)
Hemoglobin: 10.6 g/dL — ABNORMAL LOW (ref 12.0–15.0)
Hemoglobin: 11 g/dL — ABNORMAL LOW (ref 12.0–15.0)
MCH: 28.6 pg (ref 26.0–34.0)
MCH: 28.6 pg (ref 26.0–34.0)
MCHC: 30.1 g/dL (ref 30.0–36.0)
MCHC: 30.1 g/dL (ref 30.0–36.0)
MCV: 94.9 fL (ref 78.0–100.0)
MCV: 95.1 fL (ref 78.0–100.0)
Platelets: 265 10*3/uL (ref 150–400)
Platelets: 305 10*3/uL (ref 150–400)
RBC: 3.71 MIL/uL — ABNORMAL LOW (ref 3.87–5.11)
RBC: 3.85 MIL/uL — ABNORMAL LOW (ref 3.87–5.11)
RDW: 14.2 % (ref 11.5–15.5)
RDW: 14.3 % (ref 11.5–15.5)
WBC: 12.2 10*3/uL — ABNORMAL HIGH (ref 4.0–10.5)
WBC: 13.6 10*3/uL — ABNORMAL HIGH (ref 4.0–10.5)

## 2017-01-07 LAB — PROTIME-INR
INR: 1.16
Prothrombin Time: 14.7 seconds (ref 11.4–15.2)

## 2017-01-07 MED ORDER — ONDANSETRON HCL 4 MG/2ML IJ SOLN: 4.0000 mg | Freq: Four times a day (QID) | INTRAMUSCULAR | Status: DC | PRN

## 2017-01-07 MED ORDER — ACETAMINOPHEN 325 MG PO TABS: 650.0000 mg | ORAL_TABLET | Freq: Four times a day (QID) | ORAL | Status: DC | PRN

## 2017-01-07 MED ORDER — ONDANSETRON HCL 4 MG PO TABS: 4.0000 mg | ORAL_TABLET | Freq: Four times a day (QID) | ORAL | Status: DC | PRN

## 2017-01-07 MED ORDER — HYDRALAZINE HCL 20 MG/ML IJ SOLN: 10.0000 mg | INTRAMUSCULAR | Status: DC | PRN

## 2017-01-07 MED ORDER — ACETAMINOPHEN 650 MG RE SUPP: 650.0000 mg | Freq: Four times a day (QID) | RECTAL | Status: DC | PRN

## 2017-01-07 MED ORDER — SODIUM CHLORIDE 0.9 % IV SOLN
INTRAVENOUS | Status: DC
  Administered 2017-01-07: 07:00:00 via INTRAVENOUS

## 2017-01-07 MED ORDER — LEVOTHYROXINE SODIUM 75 MCG PO TABS
75.0000 ug | ORAL_TABLET | Freq: Every day | ORAL | Status: DC
  Filled 2017-01-07: qty 1

## 2017-01-07 NOTE — ED Notes (Signed)
Transported patient to [facility] for holding

## 2017-01-07 NOTE — H&P (Signed)
History and Physical    Chennel Olivos Kapur QJF:[id number] DOB: 1945/06/14 DOA: 01/06/2017  PCP: Pleas Koch, NP  Patient coming from: Home.  Chief Complaint: Epistaxis.  HPI: Teresa Jensen is a 72 y.o. female with history of hypothyroidism, prediabetes, polymyalgia rheumatica not on medications presents to the ER after patient had sudden onset of severe epistaxis.  Patient states she did not have any headache fever chills or did not have any sneezing episodes or cough.  Patient started developing sudden onset of epistaxis which was large amount and EMS was called.  As per the ER physician EMS stated that patient may have lost around 750 mL of blood.  ED Course: In the ER patient was given Afrin spray following which nasal packing was done and epistaxis stopped.  ENT surgeon Dr. Meda Coffee was consulted and at this time since patient had copious amount of epistaxis patient is being observed.  Review of Systems: As per HPI, rest all negative.   Past Medical History:  Diagnosis Date  . Anxiety   . COPD (chronic obstructive pulmonary disease) ([place])   . GERD (gastroesophageal reflux disease)   . New onset of headaches   . Osteopenia   . PMR (polymyalgia rheumatica) ([facility])   . Strain of chest wall 01/20/2011  . Tobacco abuse     Past Surgical History:  Procedure Laterality Date  . ABDOMINAL HYSTERECTOMY    . APPENDECTOMY    . CERVICAL FUSION    . CHOLECYSTECTOMY    . HEMORRHOID SURGERY    . LUMBAR FUSION    . TUBAL LIGATION       reports that she quit smoking about 7 years ago. Her smoking use included cigarettes. She has a 60.00 pack-year smoking history. she has never used smokeless tobacco. She reports that she does not drink alcohol or use drugs.  Allergies  Allergen Reactions  . Prednisone Anaphylaxis, Hives, Shortness Of Breath, Itching and Swelling    The face swells  . Prevnar 13 [Pneumococcal 13-Val Conj Vacc] Other (See Comments)    Reaction not recalled by the  patient  . Food Rash    MANGO = cause rashes around the mouth    Family History  Problem Relation Age of Onset  . Heart disease Mother   . Cancer Father        lung/chest wall   . Prostate cancer Father   . Diabetes Brother        siblings  . Hypertension Sister        x 2  . Heart disease Brother   . Breast cancer Paternal Grandmother   . Heart disease Sister   . Diabetes Sister   . Tuberculosis Sister   . Cirrhosis Brother     Prior to Admission medications   Medication Sig Start Date End Date Taking? Authorizing Provider  levothyroxine (SYNTHROID, LEVOTHROID) 75 MCG tablet Take 1 tablet by mouth every morning on empty stomach with a full glass of water. 12/05/16  Yes Pleas Koch, NP  albuterol (PROVENTIL HFA;VENTOLIN HFA) 108 (90 Base) MCG/ACT inhaler Inhale 2 puffs into the lungs every 6 (six) hours as needed for wheezing or shortness of breath. Patient not taking: Reported on 01/06/2017 05/20/15   Pleas Koch, NP  ALPRAZolam Duanne Moron) 0.25 MG tablet Take 1 tablet (0.25 mg total) by mouth daily as needed for anxiety. Patient not taking: Reported on 01/06/2017 10/17/16   Pleas Koch, NP  chlorpheniramine-HYDROcodone [facility] PENNKINETIC ER) 10-8 MG/5ML Latanya Presser  Take 5 mLs by mouth at bedtime as needed for cough. Patient not taking: Reported on 01/06/2017 12/13/16   Jearld Fenton, NP    Physical Exam: Vitals:   01/06/17 2200 01/06/17 2230 01/06/17 2245 01/06/17 2300  BP: (!) 158/66 (!) 164/70 (!) 161/72 (!) 163/62  Pulse: (!) 111 98 92 97  Resp: 16 18 16  (!) 21  Temp:      TempSrc:      SpO2: 93% 95% 96% 98%  Weight:      Height:          Constitutional: Moderately built and nourished. Vitals:   01/06/17 2200 01/06/17 2230 01/06/17 2245 01/06/17 2300  BP: (!) 158/66 (!) 164/70 (!) 161/72 (!) 163/62  Pulse: (!) 111 98 92 97  Resp: 16 18 16  (!) 21  Temp:      TempSrc:      SpO2: 93% 95% 96% 98%  Weight:      Height:       Eyes: Anicteric no  pallor. ENMT: Right nostril has been packed. Neck: No neck rigidity.  No JVD appreciated. Respiratory: No rhonchi or crepitations. Cardiovascular: S1-S2 heard no murmurs appreciated. Abdomen: Soft nontender bowel sounds present. Musculoskeletal: No edema.  No joint effusion. Skin: No rash.  Skin appears warm. Neurologic: Alert awake oriented to time place and person.  Moves all extremities. Psychiatric: Appears normal.  Normal affect.   Labs on Admission: I have personally reviewed following labs and imaging studies  CBC: Recent Labs  Lab 01/06/17 1713 01/06/17 2015  WBC 15.6* 12.2*  NEUTROABS 11.9* 10.0*  HGB 13.7 12.0  HCT 43.8 38.3  MCV 93.6 94.3  PLT 306 025   Basic Metabolic Panel: Recent Labs  Lab 01/06/17 2100  NA 138  K 4.0  CL 101  CO2 28  GLUCOSE 190*  BUN 15  CREATININE 0.69  CALCIUM 8.9   GFR: Estimated Creatinine Clearance: 56.8 mL/min (by C-G formula based on SCr of 0.69 mg/dL). Liver Function Tests: No results for input(s): AST, ALT, ALKPHOS, BILITOT, PROT, ALBUMIN in the last 168 hours. No results for input(s): LIPASE, AMYLASE in the last 168 hours. No results for input(s): AMMONIA in the last 168 hours. Coagulation Profile: No results for input(s): INR, PROTIME in the last 168 hours. Cardiac Enzymes: No results for input(s): CKTOTAL, CKMB, CKMBINDEX, TROPONINI in the last 168 hours. BNP (last 3 results) No results for input(s): PROBNP in the last 8760 hours. HbA1C: No results for input(s): HGBA1C in the last 72 hours. CBG: No results for input(s): GLUCAP in the last 168 hours. Lipid Profile: No results for input(s): CHOL, HDL, LDLCALC, TRIG, CHOLHDL, LDLDIRECT in the last 72 hours. Thyroid Function Tests: No results for input(s): TSH, T4TOTAL, FREET4, T3FREE, THYROIDAB in the last 72 hours. Anemia Panel: No results for input(s): VITAMINB12, FOLATE, FERRITIN, TIBC, IRON, RETICCTPCT in the last 72 hours. Urine analysis:    Component Value  Date/Time   COLORURINE YELLOW 03/28/2008 2304   APPEARANCEUR CLEAR 03/28/2008 2304   LABSPEC 1.020 03/28/2008 2304   PHURINE 5.5 03/28/2008 2304   GLUCOSEU NEGATIVE 03/28/2008 2304   HGBUR NEGATIVE 03/28/2008 2304   BILIRUBINUR neg 12/30/2012 0900   KETONESUR NEGATIVE 03/28/2008 2304   PROTEINUR neg 12/30/2012 0900   PROTEINUR NEGATIVE 03/28/2008 2304   UROBILINOGEN 0.2 12/30/2012 0900   UROBILINOGEN 0.2 03/28/2008 2304   NITRITE neg 12/30/2012 0900   NITRITE NEGATIVE 03/28/2008 2304   LEUKOCYTESUR Negative 12/30/2012 0900   Sepsis Labs: @LABRCNTIP (procalcitonin:4,lacticidven:4) )No results  found for this or any previous visit (from the past 240 hour(s)).   Radiological Exams on Admission: Dg Chest Portable 1 View  Result Date: 01/06/2017 CLINICAL DATA:  Hypoxia. EXAM: PORTABLE CHEST 1 VIEW COMPARISON:  Radiograph 10/03/2016 FINDINGS: Unchanged heart size and mediastinal contours with aortic atherosclerosis. Chronic hyperinflation with increased bronchial thickening from prior exam. No pulmonary edema, focal airspace disease or pneumothorax. Postsurgical change in the lower cervical spine. IMPRESSION: Increased bronchial thickening.  Chronic hyperinflation. Electronically Signed   By: Jeb Levering M.D.   On: 01/06/2017 22:42     Assessment/Plan Active Problems:   Hypothyroidism   Epistaxis   Elevated blood pressure reading    1. Severe epistaxis -cause not known.  Patient does take aspirin off and on last dose was more than a week ago as per the patient check INR.  Follow CBC.  If there is any further bleed will consult ENT while inpatient otherwise patient can be discharged home there is no further bleed by morning and follow-up with ENT. 2. Elevated blood pressure -patient blood pressure was elevated on arrival for now I have kept patient on PRN IV hydralazine.  Closely follow blood pressure trends.  Patient states her blood pressure does go up when she comes for doctor's  visits. 3. Hypothyroidism on Synthroid. 4. History of prediabetes per chart follow CBGs.   DVT prophylaxis: SCDs. Code Status: Full code. Family Communication: Discussed with patient. Disposition Plan: Home. Consults called: ENT. Admission status: Observation.   Rise Patience MD [facility] Pager [phone].  If 7PM-7AM, please contact night-coverage [email or web address] Password [email or web address]  01/07/2017, 12:44 AM

## 2017-01-07 NOTE — Discharge Summary (Signed)
Physician Discharge Summary  Teresa Jensen OBS:[id number] DOB: [birth year] DOA: 01/06/2017  PCP: Pleas Koch, NP  Admit date: 01/06/2017 Discharge date: 01/07/2017   Recommendations for Outpatient Follow-Up:   1. ENT follow up for rhino-rocket removal and epistaxis follow up 2. Patient to bring in log of BP from home -- consider medication if elevated   Discharge Diagnosis:   Active Problems:   Hypothyroidism   Epistaxis   Elevated blood pressure reading   Discharge disposition:  Home  Discharge Condition: Improved.  Diet recommendation: Low sodium, heart healthy  Wound care: None.   History of Present Illness:   Teresa Jensen is a 72 y.o. female with history of hypothyroidism, prediabetes, polymyalgia rheumatica not on medications presents to the ER after patient had sudden onset of severe epistaxis.  Patient states she did not have any headache fever chills or did not have any sneezing episodes or cough.  Patient started developing sudden onset of epistaxis which was large amount and EMS was called.  As per the ER physician EMS stated that patient may have lost around 750 mL of blood.  ED Course: In the ER patient was given Afrin spray following which nasal packing was done and epistaxis stopped.  ENT surgeon Dr. Meda Coffee was consulted and at this time since patient had copious amount of epistaxis patient is being observed.     Hospital Course by Problem:   Acute epistaxis, treated effectively with packing -ENT to see in office in 5 days to remove the packing  Elevated BP reading -patient to bring log to PCP   Medical Consultants:    ent   Discharge Exam:   Vitals:   01/07/17 0900 01/07/17 1010  BP: (!) 145/39 (!) 153/95  Pulse: 86 88  Resp: (!) 21 19  Temp:    SpO2: 96% 97%   Vitals:   01/07/17 0651 01/07/17 0700 01/07/17 0900 01/07/17 1010  BP: (!) 135/59 (!) 155/64 (!) 145/39 (!) 153/95  Pulse: 82 78 86 88  Resp: 19 13 (!) 21 19    Temp:      TempSrc:      SpO2: 96% 96% 96% 97%  Weight:      Height:        Gen:  NAD    The results of significant diagnostics from this hospitalization (including imaging, microbiology, ancillary and laboratory) are listed below for reference.     Procedures and Diagnostic Studies:   Dg Chest Portable 1 View  Result Date: 01/06/2017 CLINICAL DATA:  Hypoxia. EXAM: PORTABLE CHEST 1 VIEW COMPARISON:  Radiograph 10/03/2016 FINDINGS: Unchanged heart size and mediastinal contours with aortic atherosclerosis. Chronic hyperinflation with increased bronchial thickening from prior exam. No pulmonary edema, focal airspace disease or pneumothorax. Postsurgical change in the lower cervical spine. IMPRESSION: Increased bronchial thickening.  Chronic hyperinflation. Electronically Signed   By: Jeb Levering M.D.   On: 01/06/2017 22:42     Labs:   Basic Metabolic Panel: Recent Labs  Lab 01/06/17 2100 01/07/17 0517  NA 138 139  K 4.0 4.3  CL 101 108  CO2 28 24  GLUCOSE 190* 119*  BUN 15 22*  CREATININE 0.69 0.52  CALCIUM 8.9 7.9*   GFR Estimated Creatinine Clearance: 56.8 mL/min (by C-G formula based on SCr of 0.52 mg/dL). Liver Function Tests: No results for input(s): AST, ALT, ALKPHOS, BILITOT, PROT, ALBUMIN in the last 168 hours. No results for input(s): LIPASE, AMYLASE in the last 168 hours. No results for input(s):  AMMONIA in the last 168 hours. Coagulation profile Recent Labs  Lab 01/07/17 0105  INR 1.16    CBC: Recent Labs  Lab 01/06/17 1713 01/06/17 2015 01/07/17 0105 01/07/17 0517  WBC 15.6* 12.2* 12.2* 13.6*  NEUTROABS 11.9* 10.0*  --   --   HGB 13.7 12.0 10.6* 11.0*  HCT 43.8 38.3 35.2* 36.6  MCV 93.6 94.3 94.9 95.1  PLT 306 272 265 305   Cardiac Enzymes: No results for input(s): CKTOTAL, CKMB, CKMBINDEX, TROPONINI in the last 168 hours. BNP: Invalid input(s): POCBNP CBG: No results for input(s): GLUCAP in the last 168 hours. D-Dimer No results  for input(s): DDIMER in the last 72 hours. Hgb A1c No results for input(s): HGBA1C in the last 72 hours. Lipid Profile No results for input(s): CHOL, HDL, LDLCALC, TRIG, CHOLHDL, LDLDIRECT in the last 72 hours. Thyroid function studies No results for input(s): TSH, T4TOTAL, T3FREE, THYROIDAB in the last 72 hours.  Invalid input(s): FREET3 Anemia work up No results for input(s): VITAMINB12, FOLATE, FERRITIN, TIBC, IRON, RETICCTPCT in the last 72 hours. Microbiology No results found for this or any previous visit (from the past 240 hour(s)).   Discharge Instructions:   Discharge Instructions    Discharge instructions   Complete by:  As directed    Can get OTC afrin to have at home and use in case of nose bleed in future after notifying ENT Diet as tolerated   Increase activity slowly   Complete by:  As directed      Allergies as of 01/07/2017      Reactions   Prednisone Anaphylaxis, Hives, Shortness Of Breath, Itching, Swelling   The face swells   Prevnar 13 [pneumococcal 13-val Conj Vacc] Other (See Comments)   Reaction not recalled by the patient   Food Rash   MANGO = cause rashes around the mouth      Medication List    STOP taking these medications   ALPRAZolam 0.25 MG tablet Commonly known as:  XANAX   chlorpheniramine-HYDROcodone 10-8 MG/5ML Suer Commonly known as:  TUSSIONEX PENNKINETIC ER     TAKE these medications   albuterol 108 (90 Base) MCG/ACT inhaler Commonly known as:  PROVENTIL HFA;VENTOLIN HFA Inhale 2 puffs into the lungs every 6 (six) hours as needed for wheezing or shortness of breath.   levothyroxine 75 MCG tablet Commonly known as:  SYNTHROID, LEVOTHROID Take 1 tablet by mouth every morning on empty stomach with a full glass of water.      Follow-up Information    Izora Gala, MD. Schedule an appointment as soon as possible for a visit on 01/11/2017.   Specialty:  Otolaryngology Contact information: [address] [place] [address] [phone]        Pleas Koch, NP Follow up in 1 week(s).   Specialty:  Internal Medicine Contact information: [address] [place] [address] [phone]            Time coordinating discharge: 35 min  Signed:  Geradine Girt   [facility] 01/07/2017, 10:16 AM

## 2017-01-07 NOTE — Consult Note (Signed)
Reason for Consult: Epistaxis Referring Physician: Geradine Girt, DO  Teresa Jensen is an 72 y.o. female.  HPI: Came to the emergency department last night with acute onset of epistaxis.  She could not tell for sure which side it was as it was bleeding from both sides.  She has never had problems like this before.  She is not anticoagulated.  She is not on any nasal sprays.  Inflatable packing was placed on the right side.  She was observed overnight because of the significant blood loss.  There has been no bleeding since.  Past Medical History:  Diagnosis Date  . Anxiety   . COPD (chronic obstructive pulmonary disease) ([place])   . GERD (gastroesophageal reflux disease)   . New onset of headaches   . Osteopenia   . PMR (polymyalgia rheumatica) ([facility])   . Strain of chest wall 01/20/2011  . Tobacco abuse     Past Surgical History:  Procedure Laterality Date  . ABDOMINAL HYSTERECTOMY    . APPENDECTOMY    . CERVICAL FUSION    . CHOLECYSTECTOMY    . HEMORRHOID SURGERY    . LUMBAR FUSION    . TUBAL LIGATION      Family History  Problem Relation Age of Onset  . Heart disease Mother   . Cancer Father        lung/chest wall   . Prostate cancer Father   . Diabetes Brother        siblings  . Hypertension Sister        x 2  . Heart disease Brother   . Breast cancer Paternal Grandmother   . Heart disease Sister   . Diabetes Sister   . Tuberculosis Sister   . Cirrhosis Brother     Social History:  reports that she quit smoking about 7 years ago. Her smoking use included cigarettes. She has a 60.00 pack-year smoking history. she has never used smokeless tobacco. She reports that she does not drink alcohol or use drugs.  Allergies:  Allergies  Allergen Reactions  . Prednisone Anaphylaxis, Hives, Shortness Of Breath, Itching and Swelling    The face swells  . Prevnar 13 [Pneumococcal 13-Val Conj Vacc] Other (See Comments)    Reaction not recalled by the patient  . Food Rash    MANGO = cause rashes around the mouth    Medications: Reviewed  Results for orders placed or performed during the hospital encounter of 01/06/17 (from the past 48 hour(s))  CBC with Differential     Status: Abnormal   Collection Time: 01/06/17  5:13 PM  Result Value Ref Range   WBC 15.6 (H) 4.0 - 10.5 K/uL   RBC 4.68 3.87 - 5.11 MIL/uL   Hemoglobin 13.7 12.0 - 15.0 g/dL   HCT 43.8 36.0 - 46.0 %   MCV 93.6 78.0 - 100.0 fL   MCH 29.3 26.0 - 34.0 pg   MCHC 31.3 30.0 - 36.0 g/dL   RDW 14.1 11.5 - 15.5 %   Platelets 306 150 - 400 K/uL   Neutrophils Relative % 77 %   Neutro Abs 11.9 (H) 1.7 - 7.7 K/uL   Lymphocytes Relative 14 %   Lymphs Abs 2.2 0.7 - 4.0 K/uL   Monocytes Relative 9 %   Monocytes Absolute 1.4 (H) 0.1 - 1.0 K/uL   Eosinophils Relative 0 %   Eosinophils Absolute 0.1 0.0 - 0.7 K/uL   Basophils Relative 0 %   Basophils Absolute 0.1 0.0 -  0.1 K/uL  CBC with Differential     Status: Abnormal   Collection Time: 01/06/17  8:15 PM  Result Value Ref Range   WBC 12.2 (H) 4.0 - 10.5 K/uL   RBC 4.06 3.87 - 5.11 MIL/uL   Hemoglobin 12.0 12.0 - 15.0 g/dL   HCT 38.3 36.0 - 46.0 %   MCV 94.3 78.0 - 100.0 fL   MCH 29.6 26.0 - 34.0 pg   MCHC 31.3 30.0 - 36.0 g/dL   RDW 14.5 11.5 - 15.5 %   Platelets 272 150 - 400 K/uL   Neutrophils Relative % 82 %   Neutro Abs 10.0 (H) 1.7 - 7.7 K/uL   Lymphocytes Relative 12 %   Lymphs Abs 1.5 0.7 - 4.0 K/uL   Monocytes Relative 6 %   Monocytes Absolute 0.7 0.1 - 1.0 K/uL   Eosinophils Relative 0 %   Eosinophils Absolute 0.0 0.0 - 0.7 K/uL   Basophils Relative 0 %   Basophils Absolute 0.0 0.0 - 0.1 K/uL  Basic metabolic panel     Status: Abnormal   Collection Time: 01/06/17  9:00 PM  Result Value Ref Range   Sodium 138 135 - 145 mmol/L   Potassium 4.0 3.5 - 5.1 mmol/L   Chloride 101 101 - 111 mmol/L   CO2 28 22 - 32 mmol/L   Glucose, Bld 190 (H) 65 - 99 mg/dL   BUN 15 6 - 20 mg/dL   Creatinine, Ser 0.69 0.44 - 1.00 mg/dL    Calcium 8.9 8.9 - 10.3 mg/dL   GFR calc non Af Amer >60 >60 mL/min   GFR calc Af Amer >60 >60 mL/min    Comment: (NOTE) The eGFR has been calculated using the CKD EPI equation. This calculation has not been validated in all clinical situations. eGFR's persistently <60 mL/min signify possible Chronic Kidney Disease.    Anion gap 9 5 - 15  CBC     Status: Abnormal   Collection Time: 01/07/17  1:05 AM  Result Value Ref Range   WBC 12.2 (H) 4.0 - 10.5 K/uL   RBC 3.71 (L) 3.87 - 5.11 MIL/uL   Hemoglobin 10.6 (L) 12.0 - 15.0 g/dL   HCT 35.2 (L) 36.0 - 46.0 %   MCV 94.9 78.0 - 100.0 fL   MCH 28.6 26.0 - 34.0 pg   MCHC 30.1 30.0 - 36.0 g/dL   RDW 14.2 11.5 - 15.5 %   Platelets 265 150 - 400 K/uL  Protime-INR     Status: None   Collection Time: 01/07/17  1:05 AM  Result Value Ref Range   Prothrombin Time 14.7 11.4 - 15.2 seconds   INR 1.16   CBC     Status: Abnormal   Collection Time: 01/07/17  5:17 AM  Result Value Ref Range   WBC 13.6 (H) 4.0 - 10.5 K/uL   RBC 3.85 (L) 3.87 - 5.11 MIL/uL   Hemoglobin 11.0 (L) 12.0 - 15.0 g/dL   HCT 36.6 36.0 - 46.0 %   MCV 95.1 78.0 - 100.0 fL   MCH 28.6 26.0 - 34.0 pg   MCHC 30.1 30.0 - 36.0 g/dL   RDW 14.3 11.5 - 15.5 %   Platelets 305 150 - 400 K/uL  Basic metabolic panel     Status: Abnormal   Collection Time: 01/07/17  5:17 AM  Result Value Ref Range   Sodium 139 135 - 145 mmol/L   Potassium 4.3 3.5 - 5.1 mmol/L   Chloride 108 101 -  111 mmol/L   CO2 24 22 - 32 mmol/L   Glucose, Bld 119 (H) 65 - 99 mg/dL   BUN 22 (H) 6 - 20 mg/dL   Creatinine, Ser 0.52 0.44 - 1.00 mg/dL   Calcium 7.9 (L) 8.9 - 10.3 mg/dL   GFR calc non Af Amer >60 >60 mL/min   GFR calc Af Amer >60 >60 mL/min    Comment: (NOTE) The eGFR has been calculated using the CKD EPI equation. This calculation has not been validated in all clinical situations. eGFR's persistently <60 mL/min signify possible Chronic Kidney Disease.    Anion gap 7 5 - 15    Dg Chest  Portable 1 View  Result Date: 01/06/2017 CLINICAL DATA:  Hypoxia. EXAM: PORTABLE CHEST 1 VIEW COMPARISON:  Radiograph 10/03/2016 FINDINGS: Unchanged heart size and mediastinal contours with aortic atherosclerosis. Chronic hyperinflation with increased bronchial thickening from prior exam. No pulmonary edema, focal airspace disease or pneumothorax. Postsurgical change in the lower cervical spine. IMPRESSION: Increased bronchial thickening.  Chronic hyperinflation. Electronically Signed   By: Jeb Levering M.D.   On: 01/06/2017 22:42    FVC:[id number] except as listed in admit H&P  Blood pressure (!) 155/64, pulse 78, temperature 98 F (36.7 C), temperature source Temporal, resp. rate 13, height 4' 9"  (1.448 m), weight 81.6 kg (180 lb), SpO2 96 %.  PHYSICAL EXAM: Overall appearance:  Healthy appearing, in no distress Head:  Normocephalic, atraumatic. Ears: External ears look normal. Nose: External nose is healthy in appearance.  There is inflatable packing in the right nasal cavity.  There is no active bleeding.. Oral Cavity/Pharynx:  There are no mucosal lesions or masses identified.  There is no bleeding in the posterior pharynx. Larynx/Hypopharynx: Deferred Neuro:  No identifiable neurologic deficits. Neck: No palpable neck masses.  Studies Reviewed: none  Procedures: none   Assessment/Plan: Acute epistaxis, treated effectively with packing.  We will see her back in 5 days in the office to remove the packing.  She is instructed to contact us prior to that if there is any additional bleeding.  Izora Gala 01/07/2017, 8:38 AM

## 2017-01-07 NOTE — ED Notes (Signed)
Ordered tray 

## 2017-01-07 NOTE — Discharge Instructions (Signed)
GET OTC BP cuff and bring log of BP reading to PCP-- check 1x/day at different times

## 2017-01-07 NOTE — ED Notes (Signed)
Admit Doctor at bedside.  

## 2017-01-07 NOTE — ED Notes (Signed)
Blood collected for cbc and pt test.

## 2017-01-08 ENCOUNTER — Telehealth: Payer: Self-pay | Admitting: *Deleted

## 2017-01-08 ENCOUNTER — Inpatient Hospital Stay (HOSPITAL_COMMUNITY)
Admission: EM | Admit: 2017-01-08 | Discharge: 2017-01-10 | DRG: 305 | Disposition: A | Discharge status: Home / Self Care | Payer: Medicare Other | Attending: Internal Medicine | Admitting: Internal Medicine

## 2017-01-08 DIAGNOSIS — I160 Hypertensive urgency: Principal | ICD-10-CM | POA: Diagnosis present

## 2017-01-08 DIAGNOSIS — M353 Polymyalgia rheumatica: Secondary | ICD-10-CM | POA: Diagnosis present

## 2017-01-08 DIAGNOSIS — K219 Gastro-esophageal reflux disease without esophagitis: Secondary | ICD-10-CM | POA: Diagnosis present

## 2017-01-08 DIAGNOSIS — R040 Epistaxis: Secondary | ICD-10-CM | POA: Diagnosis not present

## 2017-01-08 DIAGNOSIS — Z87891 Personal history of nicotine dependence: Secondary | ICD-10-CM

## 2017-01-08 DIAGNOSIS — Z79899 Other long term (current) drug therapy: Secondary | ICD-10-CM

## 2017-01-08 DIAGNOSIS — Z888 Allergy status to other drugs, medicaments and biological substances status: Secondary | ICD-10-CM

## 2017-01-08 DIAGNOSIS — E039 Hypothyroidism, unspecified: Secondary | ICD-10-CM | POA: Diagnosis present

## 2017-01-08 DIAGNOSIS — I10 Essential (primary) hypertension: Secondary | ICD-10-CM | POA: Diagnosis not present

## 2017-01-08 DIAGNOSIS — Z7951 Long term (current) use of inhaled steroids: Secondary | ICD-10-CM

## 2017-01-08 DIAGNOSIS — F419 Anxiety disorder, unspecified: Secondary | ICD-10-CM | POA: Diagnosis present

## 2017-01-08 DIAGNOSIS — J449 Chronic obstructive pulmonary disease, unspecified: Secondary | ICD-10-CM | POA: Diagnosis present

## 2017-01-08 HISTORY — DX: Epistaxis: R04.0

## 2017-01-08 HISTORY — DX: Essential (primary) hypertension: I10

## 2017-01-08 LAB — I-STAT CHEM 8, ED
BUN: 9 mg/dL (ref 6–20)
Calcium, Ion: 1.2 mmol/L (ref 1.15–1.40)
Chloride: 101 mmol/L (ref 101–111)
Creatinine, Ser: 0.6 mg/dL (ref 0.44–1.00)
Glucose, Bld: 131 mg/dL — ABNORMAL HIGH (ref 65–99)
HCT: 33 % — ABNORMAL LOW (ref 36.0–46.0)
Hemoglobin: 11.2 g/dL — ABNORMAL LOW (ref 12.0–15.0)
Potassium: 4.5 mmol/L (ref 3.5–5.1)
Sodium: 141 mmol/L (ref 135–145)
TCO2: 28 mmol/L (ref 22–32)

## 2017-01-08 MED ORDER — HYDROCHLOROTHIAZIDE 25 MG PO TABS
25.0000 mg | ORAL_TABLET | Freq: Every day | ORAL | Status: DC
  Administered 2017-01-08 – 2017-01-10 (×3): 25 mg via ORAL
  Filled 2017-01-08 (×3): qty 1

## 2017-01-08 MED ORDER — DOCUSATE SODIUM 100 MG PO CAPS
100.0000 mg | ORAL_CAPSULE | Freq: Two times a day (BID) | ORAL | Status: DC
  Administered 2017-01-08 – 2017-01-10 (×4): 100 mg via ORAL
  Filled 2017-01-08 (×4): qty 1

## 2017-01-08 MED ORDER — ALBUTEROL SULFATE (2.5 MG/3ML) 0.083% IN NEBU: 2.5000 mg | INHALATION_SOLUTION | Freq: Four times a day (QID) | RESPIRATORY_TRACT | Status: DC | PRN

## 2017-01-08 MED ORDER — ONDANSETRON HCL 4 MG/2ML IJ SOLN: 4.0000 mg | Freq: Four times a day (QID) | INTRAMUSCULAR | Status: DC | PRN

## 2017-01-08 MED ORDER — HYDRALAZINE HCL 20 MG/ML IJ SOLN
10.0000 mg | INTRAMUSCULAR | Status: AC
  Administered 2017-01-08: 10 mg via INTRAVENOUS
  Filled 2017-01-08: qty 1

## 2017-01-08 MED ORDER — SALINE SPRAY 0.65 % NA SOLN
1.0000 | NASAL | Status: DC
  Administered 2017-01-08 – 2017-01-10 (×11): 1 via NASAL
  Filled 2017-01-08: qty 44

## 2017-01-08 MED ORDER — HYDRALAZINE HCL 25 MG PO TABS
25.0000 mg | ORAL_TABLET | Freq: Two times a day (BID) | ORAL | Status: DC
  Administered 2017-01-08 – 2017-01-09 (×2): 25 mg via ORAL
  Filled 2017-01-08 (×2): qty 1

## 2017-01-08 MED ORDER — OXYMETAZOLINE HCL 0.05 % NA SOLN
1.0000 | Freq: Two times a day (BID) | NASAL | Status: DC | PRN
  Administered 2017-01-09 (×3): 1 via NASAL
  Filled 2017-01-08: qty 15

## 2017-01-08 MED ORDER — LEVOTHYROXINE SODIUM 75 MCG PO TABS
75.0000 ug | ORAL_TABLET | Freq: Every day | ORAL | Status: DC
  Administered 2017-01-10: 75 ug via ORAL
  Filled 2017-01-08: qty 1

## 2017-01-08 MED ORDER — TRAMADOL HCL 50 MG PO TABS
50.0000 mg | ORAL_TABLET | Freq: Four times a day (QID) | ORAL | Status: DC | PRN
  Administered 2017-01-10: 50 mg via ORAL
  Filled 2017-01-08: qty 1

## 2017-01-08 MED ORDER — ONDANSETRON HCL 4 MG PO TABS: 4.0000 mg | ORAL_TABLET | Freq: Four times a day (QID) | ORAL | Status: DC | PRN

## 2017-01-08 NOTE — H&P (Signed)
History and Physical    Grisel Blumenstock Robin NWG:[id number] DOB: [birth year] DOA: 01/08/2017  PCP: Pleas Koch, NP  Patient coming from: Home   I have personally briefly reviewed patient's old medical records in [place]  Chief Complaint: nose bleed.   HPI: Teresa Jensen is a 72 y.o. female with medical history significant of Anxiety, COPD, Polymyalgia rheumatica, new diagnosed of HTN who was recently discharge from hospital on 1-07 for epistaxis, she was discharge with Rhinorocket  on the right side. She presents today with recurrent nose bleed on the left side. At her arrival to ED left side nose bleed stop. She was evaluated by ENT who recommend BP controlled and afrin PRN.   On my evaluation, patient denies headaches, chest pain or dyspnea. She denies prior history of BP. She started to spit, bright red blood, moderate amount. Dr Vanita Panda will call ENT again.   ED Course: presents with recurrent epistaxis, found to have elevated BP SBP at 166/53. Per ED physician ENT recommend admission to observed for recurrent epistaxis and controlled of BP.  INR on the 7th was at 1.1, hb  Today at 11, BUN 9, cr 0.6.   Review of Systems: As per HPI otherwise 10 point review of systems negative.    Past Medical History:  Diagnosis Date  . Anxiety   . COPD (chronic obstructive pulmonary disease) ([place])   . GERD (gastroesophageal reflux disease)   . New onset of headaches   . Osteopenia   . PMR (polymyalgia rheumatica) ([facility])   . Strain of chest wall 01/20/2011  . Tobacco abuse     Past Surgical History:  Procedure Laterality Date  . ABDOMINAL HYSTERECTOMY    . APPENDECTOMY    . CERVICAL FUSION    . CHOLECYSTECTOMY    . HEMORRHOID SURGERY    . LUMBAR FUSION    . TUBAL LIGATION       reports that she quit smoking about 7 years ago. Her smoking use included cigarettes. She has a 60.00 pack-year smoking history. she has never used smokeless tobacco. She reports that she does not  drink alcohol or use drugs.  Allergies  Allergen Reactions  . Prednisone Anaphylaxis, Hives, Shortness Of Breath, Itching and Swelling    The face swells  . Prevnar 13 [Pneumococcal 13-Val Conj Vacc] Other (See Comments)    Reaction not recalled by the patient  . Food Rash    MANGO = cause rashes around the mouth    Family History  Problem Relation Age of Onset  . Heart disease Mother   . Cancer Father        lung/chest wall   . Prostate cancer Father   . Diabetes Brother        siblings  . Hypertension Sister        x 2  . Heart disease Brother   . Breast cancer Paternal Grandmother   . Heart disease Sister   . Diabetes Sister   . Tuberculosis Sister   . Cirrhosis Brother      Prior to Admission medications   Medication Sig Start Date End Date Taking? Authorizing Provider  albuterol (PROVENTIL HFA;VENTOLIN HFA) 108 (90 Base) MCG/ACT inhaler Inhale 2 puffs into the lungs every 6 (six) hours as needed for wheezing or shortness of breath. Patient not taking: Reported on 01/06/2017 05/20/15   Pleas Koch, NP  levothyroxine (SYNTHROID, LEVOTHROID) 75 MCG tablet Take 1 tablet by mouth every morning on empty stomach with  a full glass of water. 12/05/16   Pleas Koch, NP    Physical Exam: Vitals:   01/08/17 1547 01/08/17 1700 01/08/17 1730  BP: (!) 166/53 (!) 155/51 (!) 169/58  Pulse: 97    Resp: 18    Temp: 98.7 F (37.1 C)    TempSrc: Oral    SpO2: 95%      Constitutional: NAD, calm, comfortable, Rhinorocket on right nostril  Vitals:   01/08/17 1547 01/08/17 1700 01/08/17 1730  BP: (!) 166/53 (!) 155/51 (!) 169/58  Pulse: 97    Resp: 18    Temp: 98.7 F (37.1 C)    TempSrc: Oral    SpO2: 95%     Eyes: PERRL, lids and conjunctivae normal ENMT: Mucous membranes are moist. Posterior pharynx clear of any exudate or lesions.Normal dentition. Spiting blood, moderate amount  Neck: normal, supple, no masses, no thyromegaly Respiratory: clear to  auscultation bilaterally, no wheezing, no crackles. Normal respiratory effort. No accessory muscle use.  Cardiovascular: Regular rate and rhythm, no murmurs / rubs / gallops. No extremity edema. 2+ pedal pulses. No carotid bruits.  Abdomen: no tenderness, no masses palpated. No hepatosplenomegaly. Bowel sounds positive.  Musculoskeletal: no clubbing / cyanosis. No joint deformity upper and lower extremities. Good ROM, no contractures. Normal muscle tone.  Skin: no rashes, lesions, ulcers. No induration Neurologic: CN 2-12 grossly intact. Sensation intact, DTR normal. Strength 5/5 in all 4.  Psychiatric: Normal judgment and insight. Alert and oriented x 3. Normal mood.    Labs on Admission: I have personally reviewed following labs and imaging studies  CBC: Recent Labs  Lab 01/06/17 1713 01/06/17 2015 01/07/17 0105 01/07/17 0517 01/08/17 1627  WBC 15.6* 12.2* 12.2* 13.6*  --   NEUTROABS 11.9* 10.0*  --   --   --   HGB 13.7 12.0 10.6* 11.0* 11.2*  HCT 43.8 38.3 35.2* 36.6 33.0*  MCV 93.6 94.3 94.9 95.1  --   PLT 306 272 265 305  --    Basic Metabolic Panel: Recent Labs  Lab 01/06/17 2100 01/07/17 0517 01/08/17 1627  NA 138 139 141  K 4.0 4.3 4.5  CL 101 108 101  CO2 28 24  --   GLUCOSE 190* 119* 131*  BUN 15 22* 9  CREATININE 0.69 0.52 0.60  CALCIUM 8.9 7.9*  --    GFR: Estimated Creatinine Clearance: 56.8 mL/min (by C-G formula based on SCr of 0.6 mg/dL). Liver Function Tests: No results for input(s): AST, ALT, ALKPHOS, BILITOT, PROT, ALBUMIN in the last 168 hours. No results for input(s): LIPASE, AMYLASE in the last 168 hours. No results for input(s): AMMONIA in the last 168 hours. Coagulation Profile: Recent Labs  Lab 01/07/17 0105  INR 1.16   Cardiac Enzymes: No results for input(s): CKTOTAL, CKMB, CKMBINDEX, TROPONINI in the last 168 hours. BNP (last 3 results) No results for input(s): PROBNP in the last 8760 hours. HbA1C: No results for input(s): HGBA1C  in the last 72 hours. CBG: No results for input(s): GLUCAP in the last 168 hours. Lipid Profile: No results for input(s): CHOL, HDL, LDLCALC, TRIG, CHOLHDL, LDLDIRECT in the last 72 hours. Thyroid Function Tests: No results for input(s): TSH, T4TOTAL, FREET4, T3FREE, THYROIDAB in the last 72 hours. Anemia Panel: No results for input(s): VITAMINB12, FOLATE, FERRITIN, TIBC, IRON, RETICCTPCT in the last 72 hours. Urine analysis:    Component Value Date/Time   COLORURINE YELLOW 03/28/2008 [place] 03/28/2008 2304   LABSPEC 1.020 03/28/2008 2304  PHURINE 5.5 03/28/2008 2304   GLUCOSEU NEGATIVE 03/28/2008 2304   HGBUR NEGATIVE 03/28/2008 2304   BILIRUBINUR neg 12/30/2012 0900   KETONESUR NEGATIVE 03/28/2008 2304   PROTEINUR neg 12/30/2012 0900   PROTEINUR NEGATIVE 03/28/2008 2304   UROBILINOGEN 0.2 12/30/2012 0900   UROBILINOGEN 0.2 03/28/2008 2304   NITRITE neg 12/30/2012 0900   NITRITE NEGATIVE 03/28/2008 2304   LEUKOCYTESUR Negative 12/30/2012 0900    Radiological Exams on Admission: Dg Chest Portable 1 View  Result Date: 01/06/2017 CLINICAL DATA:  Hypoxia. EXAM: PORTABLE CHEST 1 VIEW COMPARISON:  Radiograph 10/03/2016 FINDINGS: Unchanged heart size and mediastinal contours with aortic atherosclerosis. Chronic hyperinflation with increased bronchial thickening from prior exam. No pulmonary edema, focal airspace disease or pneumothorax. Postsurgical change in the lower cervical spine. IMPRESSION: Increased bronchial thickening.  Chronic hyperinflation. Electronically Signed   By: Jeb Levering M.D.   On: 01/06/2017 22:42      Assessment/Plan Active Problems:   Hypothyroidism   COPD (chronic obstructive pulmonary disease) ([facility])   Epistaxis   Essential hypertension  1-Epistaxis, recurrent;  She has nose packing on the right nostril. She develops nose bleeding from the left. She will be admitted for observation and BP controlled.  Afrin PRN. [place] nasal  spray every 2 hours while awake.  Bleeding reoccur. Dr Vanita Panda will contact ENT.  Repeat hb in am.  Platelet count two days ago was normal.   2-HTN; uncontrolled.  She will received PRN hydralazine  I will start HCTZ and oral hydralazine.   3-COPD; she uses nebulizer PRN.   4-Hypothyroidism;  Continue with synthroid.    DVT prophylaxis: SCD. Code Status: full code.  Family Communication: care discussed with patient.  Disposition Plan: Home in 24 hours.  Consults called: ENT; [facility] Guest Admission status: observation.   Elmarie Shiley MD [facility] Pager [phone]  If 7PM-7AM, please contact night-coverage [email or web address] Password [facility]  01/08/2017, 5:58 PM

## 2017-01-08 NOTE — ED Notes (Signed)
ENT cart at bedside

## 2017-01-08 NOTE — Consult Note (Signed)
[facility] CONSULTATION  Primary Care Physician: Pleas Koch, NP Patient Location at Initial Consult: Emergency Department Chief Complaint/Reason for Consult: recurrent epistaxis  History of Presenting Illness:  History obtained from  Patient and EMR Teresa Jensen is a  72 y.o. female presenting with  Epistaxis. This began 2 days ago. This was the first time an epistaxis of this magnitude has ever occurred for the patient. She had epistaxis on the right side at the time. This was seen in the ED and managed by the ED with a rhinorocket which remains in place in her right naris. At this time, she had elevated systolic BPs to 409W. She was observed overnight and sent home yesterday. Today, while sitting on the couch with her husband, she had a little bleeding from the left side of her nose, and coughed up a few blood clots. Upon arrival to ED, bleeding has stopped. Nothing dripping posteriorly. Bps still 119J systolic. She is not on any anticoagulation. No previous diagnosis of HTN until this week, not taking anything baseline. Not using nasal humidification.  Since arrival to ED today, bleeding stopped spontaneously. No HA, facial pain, prior difficulty breathing through nose, neck masses, dysphagia, vision changes.   Past Medical History:  Diagnosis Date  . Anxiety   . COPD (chronic obstructive pulmonary disease) ([place])   . GERD (gastroesophageal reflux disease)   . New onset of headaches   . Osteopenia   . PMR (polymyalgia rheumatica) ([facility])   . Strain of chest wall 01/20/2011  . Tobacco abuse     Past Surgical History:  Procedure Laterality Date  . ABDOMINAL HYSTERECTOMY    . APPENDECTOMY    . CERVICAL FUSION    . CHOLECYSTECTOMY    . HEMORRHOID SURGERY    . LUMBAR FUSION    . TUBAL LIGATION      Family History  Problem Relation Age of Onset  . Heart disease Mother   . Cancer Father        lung/chest wall   . Prostate cancer Father    . Diabetes Brother        siblings  . Hypertension Sister        x 2  . Heart disease Brother   . Breast cancer Paternal Grandmother   . Heart disease Sister   . Diabetes Sister   . Tuberculosis Sister   . Cirrhosis Brother     Social History   Socioeconomic History  . Marital status: Married    Spouse name: Not on file  . Number of children: 1  . Years of education: Not on file  . Highest education level: Not on file  Social Needs  . Financial resource strain: Not on file  . Food insecurity - worry: Not on file  . Food insecurity - inability: Not on file  . Transportation needs - medical: Not on file  . Transportation needs - non-medical: Not on file  Occupational History  . Occupation: Therapist, art as a Therapist, art: RETIRED    Comment: retired  Tobacco Use  . Smoking status: Former Smoker    Packs/day: 1.50    Years: 40.00    Pack years: 60.00    Types: Cigarettes    Last attempt to quit: 02/11/2009    Years since quitting: 7.9  . Smokeless tobacco: Never Used  Substance and Sexual Activity  . Alcohol use: No    Alcohol/week: 0.0 oz  . Drug use:  No  . Sexual activity: No  Other Topics Concern  . Not on file  Social History Narrative   Lives in [place] with her husband.  Takes care of her two grandchildren, ages 52 and 2, everyday in [place].  Has one son who is 79.   Desires CPR   Would not want prolonged life support if futile    No current facility-administered medications on file prior to encounter.    Current Outpatient Medications on File Prior to Encounter  Medication Sig Dispense Refill  . albuterol (PROVENTIL HFA;VENTOLIN HFA) 108 (90 Base) MCG/ACT inhaler Inhale 2 puffs into the lungs every 6 (six) hours as needed for wheezing or shortness of breath. (Patient not taking: Reported on 01/06/2017) 1 Inhaler 1  . levothyroxine (SYNTHROID, LEVOTHROID) 75 MCG tablet Take 1 tablet by mouth every morning on empty stomach with a full  glass of water. 90 tablet 3    Allergies  Allergen Reactions  . Prednisone Anaphylaxis, Hives, Shortness Of Breath, Itching and Swelling    The face swells  . Prevnar 13 [Pneumococcal 13-Val Conj Vacc] Other (See Comments)    Reaction not recalled by the patient  . Food Rash    MANGO = cause rashes around the mouth     Review of Systems: Complete except for above noted HPI and:  -Some recent anxiety since having epistaxis   OBJECTIVE: Vital Signs: Vitals:   01/08/17 1547  BP: (!) 166/53  Pulse: 97  Resp: 18  Temp: 98.7 F (37.1 C)  SpO2: 95%    I&O No intake or output data in the 24 hours ending 01/08/17 1609  Physical Exam General: Well developed, well nourished. No acute distress. Voice normal  Head/Face: Normocephalic, atraumatic. No scars or lesions. No sinus tenderness. Facial nerve intact and equal bilaterally.  No facial lacerations. Salivary glands non tender and without palpable masses  Eyes: Globes well positioned, no proptosis Lids: No periorbital edema/ecchymosis. No lid laceration Conjunctiva: No chemosis, hemorrhage PERRL, EOMI  Ears: No gross deformity. Normal external canal. No obstructing cerumen  Hearing:  Normal speech reception.  Nose: No gross deformity or lesions. No purulent discharge. Septum midline.  Right nose with RhinoRocket in place Left nostril with small dried blood along floor of nose, no prominent vessels in Kiesselbach's plexus. No active epistaxis  Mouth/Oropharynx: Lips without any lesions. Dentition fair. No mucosal lesions within the oropharynx. No tonsillar enlargement, exudate, or lesions. Pharyngeal walls symmetrical. Uvula midline. Tongue midline without lesions. No bleeding in posterior oropharynx  Neck: Trachea midline. No masses. No thyromegaly or nodules palpated. No crepitus.  Lymphatic: No lymphadenopathy in the neck.  Respiratory: No stridor or distress.  Cardiovascular: Regular rate and rhythm.  Extremities: No edema  or cyanosis. Warm and well-perfused.  Skin: No scars or lesions on face or neck.  Neurologic: CN II-XII intact. Moving all extremities without gross abnormality.  Other:      Labs reviewed: Lab Results  Component Value Date   WBC 13.6 (H) 01/07/2017   HGB 11.0 (L) 01/07/2017   HCT 36.6 01/07/2017   PLT 305 01/07/2017   CHOL 135 10/10/2016   TRIG 93.0 10/10/2016   HDL 48.20 10/10/2016   ALT 23 09/20/2016   AST 22 09/20/2016   NA 139 01/07/2017   K 4.3 01/07/2017   CL 108 01/07/2017   CREATININE 0.52 01/07/2017   BUN 22 (H) 01/07/2017   CO2 24 01/07/2017   TSH 3.58 10/10/2016   INR 1.16 01/07/2017  HGBA1C 6.1 10/10/2016   * HGB was 11 on DC from hospital, now 11.2 today (so rising).   DC Summary 01/07/17 reviewed.   ASSESSMENT:  72 y.o. female with recurrent epistaxis in the setting of uncontrolled hypertension. Would prefer to avoid bilateral packing due to potential for airway obstruction with bilateral nasal packing. Also, not bleeding at all and stopped spontaneously. This is encouraging that conservative measures, such as BP control, will adequately manage this epistaxis.   RECOMMENDATIONS: 1. Thrombocytopenia certainly increases risk of bleeding in this cold dry environment. Platelet transfusions as needed. (Currently not applicable) 2. Maintain tight BP control, as hypertension will certainly increase epistaxis occurrences.  3. Reduce nasal manipulation - no nose picking, no tissues in the nose other than for dabbing. No nose blowing.   4. Once one epistaxis episode occurs, it is common for some minor dripping to occur again over the next couple of days. If it does, use Afrin nasal spray for ACTIVE bleeding on the bleeding side and hold very firm anterior nasal pressure for 10-15 mins for ACTIVE bleeding. Put the bottle of Afrin parallel to the floor and pour medicine in nose, not spray. Recommend keeping a bottle of Afrin at the bedside just in case. 5. Please order  Ocean nasal spray q2 hr while awake to moisten the nasal mucosa to decrease nasal crusting and cracking. Use nasal saline spray for congestion instead of blowing the nose. After using the nasal saline spray, there may be some old blood that comes out with the irrigation; this is normal. Make sure the nasal spray is applied to the side of the packing, as well as on the opposite side of the nose.  6. If nose continues to bleed bright red blood after pouring Afrin and holding firm pressure for a full 15 minutes in the setting of better blood pressure control, please contact ENT for assistance.    Follow up with Dr. Blenda Nicely on 01/11/17 for packing removal. Our office will arrange this appointment.     Gavin Pound, MD  [facility], [place] Office phone [phone]

## 2017-01-08 NOTE — ED Notes (Signed)
Ordered heart healthy tray.   To 5C08.

## 2017-01-08 NOTE — ED Provider Notes (Signed)
[facility] EMERGENCY DEPARTMENT Provider Note   CSN: [id number] Arrival date & time: 01/08/17  1536     History   Chief Complaint Chief Complaint  Patient presents with  . Epistaxis    HPI REATHER STELLER is a 72 y.o. female.  HPI  Patient presents approximately 30 hours after being discharged from this facility, now with concern for epistaxis. Patient initially arrived at the ED for 8 hours ago, after she developed nosebleed. Patient was hypertensive, and there was sustained effort required to achieve hemostasis. Patient was admitted overnight after receiving medication infused Rhino Rocket, and blood pressure control efforts. She notes that on discharge she was well, but developed bleeding just prior to calling EMS. She notes that she is having bleeding in her throat, and out of her left nostril, making it difficult to breathe, but she had no new chest pain, dyspnea, no new syncope. No attempts made for bleeding control prior to ED arrival.   Case discussed with EMS personnel.  Past Medical History:  Diagnosis Date  . Anxiety   . COPD (chronic obstructive pulmonary disease) ([place])   . GERD (gastroesophageal reflux disease)   . New onset of headaches   . Osteopenia   . PMR (polymyalgia rheumatica) ([facility])   . Strain of chest wall 01/20/2011  . Tobacco abuse     Patient Active Problem List   Diagnosis Date Noted  . Epistaxis 01/07/2017  . Elevated blood pressure reading 01/07/2017  . Prediabetes 10/17/2015  . Herpes zoster 07/08/2015  . Pain of right lower extremity 05/20/2015  . 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 Medicare annual wellness visit, subsequent 10/12/2014  . Occipital neuralgia 10/12/2014  . Vitamin D deficiency 10/12/2014  . COPD (chronic obstructive pulmonary disease) ([place]) 06/05/2010  . Hypothyroidism 09/29/2009  . Situational anxiety 10/01/2008  . GERD 10/01/2008  . Osteoarthritis 10/01/2008    Past Surgical History:    Procedure Laterality Date  . ABDOMINAL HYSTERECTOMY    . APPENDECTOMY    . CERVICAL FUSION    . CHOLECYSTECTOMY    . HEMORRHOID SURGERY    . LUMBAR FUSION    . TUBAL LIGATION      OB History    No data available       Home Medications    Prior to Admission medications   Medication Sig Start Date End Date Taking? Authorizing Provider  albuterol (PROVENTIL HFA;VENTOLIN HFA) 108 (90 Base) MCG/ACT inhaler Inhale 2 puffs into the lungs every 6 (six) hours as needed for wheezing or shortness of breath. Patient not taking: Reported on 01/06/2017 05/20/15   Pleas Koch, NP  levothyroxine (SYNTHROID, LEVOTHROID) 75 MCG tablet Take 1 tablet by mouth every morning on empty stomach with a full glass of water. 12/05/16   Pleas Koch, NP    Family History Family History  Problem Relation Age of Onset  . Heart disease Mother   . Cancer Father        lung/chest wall   . Prostate cancer Father   . Diabetes Brother        siblings  . Hypertension Sister        x 2  . Heart disease Brother   . Breast cancer Paternal Grandmother   . Heart disease Sister   . Diabetes Sister   . Tuberculosis Sister   . Cirrhosis Brother     Social History Social History   Tobacco Use  . Smoking status: Former Smoker    Packs/day:  1.50    Years: 40.00    Pack years: 60.00    Types: Cigarettes    Last attempt to quit: 02/11/2009    Years since quitting: 7.9  . Smokeless tobacco: Never Used  Substance Use Topics  . Alcohol use: No    Alcohol/week: 0.0 oz  . Drug use: No     Allergies   Prednisone; Prevnar 13 [pneumococcal 13-val conj vacc]; and Food   Review of Systems Review of Systems  Constitutional:       Per HPI, otherwise negative  HENT:       Epistaxis  Respiratory:       Per HPI, otherwise negative  Cardiovascular:       Per HPI, otherwise negative  Gastrointestinal: Negative for vomiting.  Endocrine:       Negative aside from HPI  Genitourinary:       Neg  aside from HPI   Musculoskeletal:       Per HPI, otherwise negative  Skin: Negative.   Neurological: Negative for syncope.     Physical Exam Updated Vital Signs BP (!) 166/53 (BP Location: Left Arm)   Pulse 97   Temp 98.7 F (37.1 C) (Oral)   Resp 18   SpO2 95%   Physical Exam  Constitutional: She is oriented to person, place, and time. She appears well-developed and well-nourished.  Uncomfortable appearing F awake and alert, arriving via EMS, w visible blood on inferior nose  HENT:  Head: Normocephalic and atraumatic.    Mouth/Throat:    Eyes: Conjunctivae and EOM are normal.  Cardiovascular: Normal rate and regular rhythm.  Pulmonary/Chest: Effort normal and breath sounds normal. No stridor. No respiratory distress.  Abdominal: She exhibits no distension.  Musculoskeletal: She exhibits no edema.  Neurological: She is alert and oriented to person, place, and time. No cranial nerve deficit.  Skin: Skin is warm and dry.  Psychiatric: She has a normal mood and affect.  Nursing note and vitals reviewed.    ED Treatments / Results  Labs (all labs ordered are listed, but only abnormal results are displayed) Labs Reviewed  I-STAT CHEM 8, ED - Abnormal; Notable for the following components:      Result Value   Glucose, Bld 131 (*)    Hemoglobin 11.2 (*)    HCT 33.0 (*)    All other components within normal limits     Radiology Dg Chest Portable 1 View  Result Date: 01/06/2017 CLINICAL DATA:  Hypoxia. EXAM: PORTABLE CHEST 1 VIEW COMPARISON:  Radiograph 10/03/2016 FINDINGS: Unchanged heart size and mediastinal contours with aortic atherosclerosis. Chronic hyperinflation with increased bronchial thickening from prior exam. No pulmonary edema, focal airspace disease or pneumothorax. Postsurgical change in the lower cervical spine. IMPRESSION: Increased bronchial thickening.  Chronic hyperinflation. Electronically Signed   By: Jeb Levering M.D.   On: 01/06/2017 22:42     Procedures Procedures (including critical care time)  Medications Ordered in ED Medications  hydrALAZINE (APRESOLINE) injection 10 mg (not administered)     Initial Impression / Assessment and Plan / ED Course  I have reviewed the triage vital signs and the nursing notes.  Pertinent labs & imaging results that were available during my care of the patient were reviewed by me and considered in my medical decision making (see chart for details).  4:59 PM I discussed patient's case with our ENT colleague, who has seen and evaluated the patient. Bleeding has stopped. Blood pressure remains elevated, and the patient is receiving hydralazine.  Initial labs consistent with those on discharge yesterday.  Elderly female presents with recurrent epistaxis. Patient is initially bleeding, though this stops in the ED. Patient's blood pressure addressed with hydralazine, though absent any ongoing oral therapy, patient will require management for initiation of appropriate agent. Patient seen and evaluated by our ENT colleague, who was available for further consultation, has provided recommendations, including Afrin.  With no active bleeding, but with need for blood pressure control, and monitoring given her patient will be admitted for observation.   Final Clinical Impressions(s) / ED Diagnoses  Epistaxis Hypertensive urgency   Carmin Muskrat, MD 01/08/17 1700

## 2017-01-08 NOTE — Care Management Obs Status (Signed)
[place] [facility] NOTIFICATION   Patient Details  Name: Teresa Jensen MRN: [id number] Date of Birth: 1945-01-21   Medicare Observation Status Notification Given:  Yes    Apolonio Schneiders, RN 01/08/2017, 6:55 PM

## 2017-01-08 NOTE — Telephone Encounter (Signed)
Transition Care Management Follow-up Telephone Call   Date discharged? 01/07/2017   How have you been since you were released from the hospital? ok   Do you understand why you were in the hospital? yes   Do you understand the discharge instructions? yes   Where were you discharged to? home   Items Reviewed:  Medications reviewed: yes  Allergies reviewed: yes  Dietary changes reviewed: n/a  Referrals reviewed: yes   Functional Questionnaire:   Activities of Daily Living (ADLs):   She states they are independent in the following: independent in all areas    Any transportation issues/concerns?: no   Any patient concerns? Yes; appt times needing to be before 1pm   Confirmed importance and date/time of follow-up visits scheduled yes  Provider Appointment booked with  [facility] 01/14/17  Confirmed with patient if condition begins to worsen call PCP or go to the ER.  Patient was given the office number and encouraged to call back with question or concerns.  : yes

## 2017-01-08 NOTE — ED Triage Notes (Signed)
Pt reports she was sitting on the couch and nose started to bleed with the rhino rocket in place. Pt was seen and DC on Monday with Rhino rocket in place. Pt denies any injuries, Pt does not take blood thinners and does not have have a HX of nose bleeds. Pt nose bleed stopped by the time she arrived to ED via EMS

## 2017-01-09 ENCOUNTER — Other Ambulatory Visit: Payer: Self-pay

## 2017-01-09 ENCOUNTER — Encounter (HOSPITAL_COMMUNITY): Payer: Self-pay

## 2017-01-09 DIAGNOSIS — R040 Epistaxis: Secondary | ICD-10-CM

## 2017-01-09 DIAGNOSIS — Z87891 Personal history of nicotine dependence: Secondary | ICD-10-CM | POA: Diagnosis not present

## 2017-01-09 DIAGNOSIS — F419 Anxiety disorder, unspecified: Secondary | ICD-10-CM | POA: Diagnosis present

## 2017-01-09 DIAGNOSIS — Z888 Allergy status to other drugs, medicaments and biological substances status: Secondary | ICD-10-CM | POA: Diagnosis not present

## 2017-01-09 DIAGNOSIS — E039 Hypothyroidism, unspecified: Secondary | ICD-10-CM | POA: Diagnosis present

## 2017-01-09 DIAGNOSIS — I160 Hypertensive urgency: Principal | ICD-10-CM

## 2017-01-09 DIAGNOSIS — I10 Essential (primary) hypertension: Secondary | ICD-10-CM | POA: Diagnosis not present

## 2017-01-09 DIAGNOSIS — K219 Gastro-esophageal reflux disease without esophagitis: Secondary | ICD-10-CM | POA: Diagnosis present

## 2017-01-09 DIAGNOSIS — M353 Polymyalgia rheumatica: Secondary | ICD-10-CM | POA: Diagnosis present

## 2017-01-09 DIAGNOSIS — Z7951 Long term (current) use of inhaled steroids: Secondary | ICD-10-CM | POA: Diagnosis not present

## 2017-01-09 DIAGNOSIS — E038 Other specified hypothyroidism: Secondary | ICD-10-CM | POA: Diagnosis not present

## 2017-01-09 DIAGNOSIS — Z79899 Other long term (current) drug therapy: Secondary | ICD-10-CM | POA: Diagnosis not present

## 2017-01-09 DIAGNOSIS — J449 Chronic obstructive pulmonary disease, unspecified: Secondary | ICD-10-CM | POA: Diagnosis not present

## 2017-01-09 HISTORY — DX: Epistaxis: R04.0

## 2017-01-09 LAB — CBC
HCT: 32.4 % — ABNORMAL LOW (ref 36.0–46.0)
HCT: 34.2 % — ABNORMAL LOW (ref 36.0–46.0)
Hemoglobin: 9.8 g/dL — ABNORMAL LOW (ref 12.0–15.0)
Hemoglobin: 10.7 g/dL — ABNORMAL LOW (ref 12.0–15.0)
MCH: 28 pg (ref 26.0–34.0)
MCH: 28.8 pg (ref 26.0–34.0)
MCHC: 30.2 g/dL (ref 30.0–36.0)
MCHC: 31.3 g/dL (ref 30.0–36.0)
MCV: 92.6 fL (ref 78.0–100.0)
MCV: 92.2 fL (ref 78.0–100.0)
Platelets: 285 10*3/uL (ref 150–400)
Platelets: 299 10*3/uL (ref 150–400)
RBC: 3.5 MIL/uL — ABNORMAL LOW (ref 3.87–5.11)
RBC: 3.71 MIL/uL — ABNORMAL LOW (ref 3.87–5.11)
RDW: 14.5 % (ref 11.5–15.5)
RDW: 14.4 % (ref 11.5–15.5)
WBC: 11.6 10*3/uL — ABNORMAL HIGH (ref 4.0–10.5)
WBC: 14.9 10*3/uL — ABNORMAL HIGH (ref 4.0–10.5)

## 2017-01-09 LAB — BASIC METABOLIC PANEL
Anion gap: 8 (ref 5–15)
BUN: 8 mg/dL (ref 6–20)
CO2: 29 mmol/L (ref 22–32)
Calcium: 8.7 mg/dL — ABNORMAL LOW (ref 8.9–10.3)
Chloride: 100 mmol/L — ABNORMAL LOW (ref 101–111)
Creatinine, Ser: 0.56 mg/dL (ref 0.44–1.00)
GFR calc Af Amer: >60 mL/min (ref >60)
GFR calc non Af Amer: >60 mL/min (ref >60)
Glucose, Bld: 111 mg/dL — ABNORMAL HIGH (ref 65–99)
Potassium: 3.7 mmol/L (ref 3.5–5.1)
Sodium: 137 mmol/L (ref 135–145)

## 2017-01-09 LAB — HEMOGLOBIN AND HEMATOCRIT, BLOOD
HCT: 33.1 % — ABNORMAL LOW (ref 36.0–46.0)
Hemoglobin: 10.5 g/dL — ABNORMAL LOW (ref 12.0–15.0)

## 2017-01-09 LAB — ABO/RH: ABO/RH(D): A POS

## 2017-01-09 LAB — TYPE AND SCREEN
ABO/RH(D): A POS
Antibody Screen: NEGATIVE

## 2017-01-09 MED ORDER — LIDOCAINE-EPINEPHRINE (PF) 1 %-1:200000 IJ SOLN: 0.0000 mL | Freq: Once | INTRAMUSCULAR | Status: DC | PRN

## 2017-01-09 MED ORDER — OXYMETAZOLINE HCL 0.05 % NA SOLN: 1.0000 | Freq: Once | NASAL | Status: DC | PRN

## 2017-01-09 MED ORDER — CEPHALEXIN 250 MG PO CAPS
250.0000 mg | ORAL_CAPSULE | Freq: Four times a day (QID) | ORAL | Status: DC
  Administered 2017-01-09 – 2017-01-10 (×3): 250 mg via ORAL
  Filled 2017-01-09 (×4): qty 1

## 2017-01-09 MED ORDER — SILVER NITRATE-POT NITRATE 75-25 % EX MISC
1.0000 | Freq: Once | CUTANEOUS | Status: DC | PRN
  Filled 2017-01-09: qty 1

## 2017-01-09 MED ORDER — HYDRALAZINE HCL 50 MG PO TABS
50.0000 mg | ORAL_TABLET | Freq: Three times a day (TID) | ORAL | Status: DC
  Administered 2017-01-09 – 2017-01-10 (×2): 50 mg via ORAL
  Filled 2017-01-09 (×2): qty 1

## 2017-01-09 MED ORDER — LIDOCAINE-EPINEPHRINE (PF) 1 %-1:200000 IJ SOLN
0.0000 mL | Freq: Once | INTRAMUSCULAR | Status: DC | PRN
  Filled 2017-01-09: qty 30

## 2017-01-09 MED ORDER — SALINE SPRAY 0.65 % NA SOLN
2.0000 | NASAL | Status: DC | PRN
  Filled 2017-01-09: qty 44

## 2017-01-09 MED ORDER — LIDOCAINE HCL 2 % EX GEL: 1.0000 "application " | Freq: Once | CUTANEOUS | Status: DC | PRN

## 2017-01-09 MED ORDER — SILVER NITRATE-POT NITRATE 75-25 % EX MISC: 1.0000 | Freq: Once | CUTANEOUS | Status: DC | PRN

## 2017-01-09 MED ORDER — LIDOCAINE HCL 2 % EX GEL
1.0000 "application " | Freq: Once | CUTANEOUS | Status: DC | PRN
  Filled 2017-01-09: qty 5

## 2017-01-09 MED ORDER — LIDOCAINE HCL 4 % EX SOLN
0.0000 mL | Freq: Once | CUTANEOUS | Status: DC | PRN
  Filled 2017-01-09: qty 50

## 2017-01-09 MED ORDER — HYDRALAZINE HCL 20 MG/ML IJ SOLN: 10.0000 mg | Freq: Three times a day (TID) | INTRAMUSCULAR | Status: DC | PRN

## 2017-01-09 MED ORDER — TRIPLE ANTIBIOTIC 3.5-400-5000 EX OINT
1.0000 | TOPICAL_OINTMENT | Freq: Once | CUTANEOUS | Status: DC | PRN
Start: 2017-01-09 — End: 2017-01-10
  Filled 2017-01-09: qty 1

## 2017-01-09 MED ORDER — OXYMETAZOLINE HCL 0.05 % NA SOLN
1.0000 | Freq: Once | NASAL | Status: DC | PRN
  Filled 2017-01-09: qty 15

## 2017-01-09 MED ORDER — COCAINE HCL 4 % EX SOLN
4.0000 mL | Freq: Once | CUTANEOUS | Status: DC
  Filled 2017-01-09: qty 4

## 2017-01-09 MED ORDER — TRIPLE ANTIBIOTIC 3.5-400-5000 EX OINT: 1.0000 "application " | TOPICAL_OINTMENT | Freq: Once | CUTANEOUS | Status: DC | PRN

## 2017-01-09 MED ORDER — HYDRALAZINE HCL 20 MG/ML IJ SOLN: 10.0000 mg | Freq: Four times a day (QID) | INTRAMUSCULAR | Status: DC | PRN

## 2017-01-09 MED ORDER — LIDOCAINE HCL 4 % EX SOLN: 0.0000 mL | Freq: Once | CUTANEOUS | Status: DC | PRN

## 2017-01-09 MED ORDER — HYDRALAZINE HCL 25 MG PO TABS
25.0000 mg | ORAL_TABLET | Freq: Three times a day (TID) | ORAL | Status: DC
  Administered 2017-01-09: 25 mg via ORAL
  Filled 2017-01-09: qty 1

## 2017-01-09 MED ORDER — PANTOPRAZOLE SODIUM 40 MG PO TBEC
40.0000 mg | DELAYED_RELEASE_TABLET | Freq: Every day | ORAL | Status: DC
  Administered 2017-01-09 – 2017-01-10 (×2): 40 mg via ORAL
  Filled 2017-01-09 (×2): qty 1

## 2017-01-09 NOTE — ED Notes (Addendum)
Pt called out for nose bleed through rhino rocket. Paged Floor coverage MD Schorr. Will continue to monitor.

## 2017-01-09 NOTE — Progress Notes (Signed)
ENT office notified. Was told that on-call doctor would be by to see patient this afternoon. Patient and family aware. Will continue to monitor pt closely. Leanne Chang, RN

## 2017-01-09 NOTE — ED Notes (Signed)
Admitting at the bedside.  

## 2017-01-09 NOTE — ED Notes (Signed)
Pt's nose bleed controlled at this time. Pt spitting out small blood clots from nasal drainage. Will continue to monitor.

## 2017-01-09 NOTE — Progress Notes (Addendum)
PROGRESS NOTE    Teresa Jensen  HBZ:[id number] DOB: 16-Jun-1945 DOA: 01/08/2017 PCP: Pleas Koch, NP   Brief Narrative:: Teresa Jensen is a 72 y.o. female with medical history significant of Anxiety, COPD, Polymyalgia rheumatica, new diagnosed of HTN who was recently discharge from hospital on 1-07 for epistaxis, she was discharge with Rhinorocket  on the right side. She presents today with recurrent nose bleed on the left side. At her arrival to ED left side nose bleed stop. She was evaluated by ENT who recommend BP controlled and afrin PRN.   On my evaluation, patient denies headaches, chest pain or dyspnea. She denies prior history of BP. She started to spit, bright red blood, moderate amount. Dr Vanita Panda will call ENT again.   ED Course: presents with recurrent epistaxis, found to have elevated BP SBP at 166/53. Per ED physician ENT recommend admission to observed for recurrent epistaxis and controlled of BP.  INR on the 7th was at 1.1, hb  Today at 11, BUN 9, cr 0.6.    Assessment & Plan:   Active Problems:   Hypothyroidism   COPD (chronic obstructive pulmonary disease) ([facility])   Epistaxis   Essential hypertension  1-Epistaxis; recurrent.  Hb drop from 11 to 9.8.  Multiples episodes overnight, received afrin.  ENT will see patient today.  Repeat Hb, type and screen. Transfusion as needed.  BP better controlled.   2-HTN, uncontrolled.  Started on HCTZ and Hydralazine.  Continue with PRN Hydralazine.  BP has been better controlled.  BP in the 150, change hydralazine to TID. Still 150 this afternoon, will increase hydralazine to 50 mg.   3-COPD; continue with nebulizer PRN.   4-Hypothyroidism ; continue with synthroid.     DVT prophylaxis; scd Code Status: full code.  Family Communication: care discussed with patient,  Disposition Plan: Remain in patient, patient not stable to be discharge, recurrent epistaxis and decreasing hb.    Consultants:   Dr  Blenda Nicely   Procedures:  none   Antimicrobials:   none   Subjective: She is not doing well, she had multiples episodes of nose bleed overnight.  Denies chest pain.   Objective: Vitals:   01/09/17 0500 01/09/17 0600 01/09/17 0706 01/09/17 0709  BP: (!) 137/51 (!) 148/61 (!) 161/66 (!) 161/66  Pulse: 91 81  88  Resp: (!) 21 15  (!) 21  Temp:      TempSrc:      SpO2: 92% 94%  95%   No intake or output data in the 24 hours ending 01/09/17 0801 There were no vitals filed for this visit.  Examination:  General exam: Appears calm and comfortable Rhinorocket on the right.  Respiratory system: Clear to auscultation. Respiratory effort normal. Cardiovascular system: S1 & S2 heard, RRR. No JVD, murmurs, rubs, gallops or clicks. No pedal edema. Gastrointestinal system: Abdomen is nondistended, soft and nontender. No organomegaly or masses felt. Normal bowel sounds heard. Central nervous system: Alert and oriented. No focal neurological deficits. Extremities: Symmetric 5 x 5 power. Skin: No rashes, lesions or ulcers Psychiatry: anxious  Data Reviewed: I have personally reviewed following labs and imaging studies  CBC: Recent Labs  Lab 01/06/17 1713 01/06/17 2015 01/07/17 0105 01/07/17 0517 01/08/17 1627 01/09/17 0333  WBC 15.6* 12.2* 12.2* 13.6*  --  11.6*  NEUTROABS 11.9* 10.0*  --   --   --   --   HGB 13.7 12.0 10.6* 11.0* 11.2* 9.8*  HCT 43.8 38.3 35.2* 36.6 33.0* 32.4*  MCV 93.6 94.3 94.9 95.1  --  92.6  PLT 306 272 265 305  --  160   Basic Metabolic Panel: Recent Labs  Lab 01/06/17 2100 01/07/17 0517 01/08/17 1627 01/09/17 0333  NA 138 139 141 137  K 4.0 4.3 4.5 3.7  CL 101 108 101 100*  CO2 28 24  --  29  GLUCOSE 190* 119* 131* 111*  BUN 15 22* 9 8  CREATININE 0.69 0.52 0.60 0.56  CALCIUM 8.9 7.9*  --  8.7*   GFR: Estimated Creatinine Clearance: 56.8 mL/min (by C-G formula based on SCr of 0.56 mg/dL). Liver Function Tests: No results for  input(s): AST, ALT, ALKPHOS, BILITOT, PROT, ALBUMIN in the last 168 hours. No results for input(s): LIPASE, AMYLASE in the last 168 hours. No results for input(s): AMMONIA in the last 168 hours. Coagulation Profile: Recent Labs  Lab 01/07/17 0105  INR 1.16   Cardiac Enzymes: No results for input(s): CKTOTAL, CKMB, CKMBINDEX, TROPONINI in the last 168 hours. BNP (last 3 results) No results for input(s): PROBNP in the last 8760 hours. HbA1C: No results for input(s): HGBA1C in the last 72 hours. CBG: No results for input(s): GLUCAP in the last 168 hours. Lipid Profile: No results for input(s): CHOL, HDL, LDLCALC, TRIG, CHOLHDL, LDLDIRECT in the last 72 hours. Thyroid Function Tests: No results for input(s): TSH, T4TOTAL, FREET4, T3FREE, THYROIDAB in the last 72 hours. Anemia Panel: No results for input(s): VITAMINB12, FOLATE, FERRITIN, TIBC, IRON, RETICCTPCT in the last 72 hours. Sepsis Labs: No results for input(s): PROCALCITON, LATICACIDVEN in the last 168 hours.  No results found for this or any previous visit (from the past 240 hour(s)).       Radiology Studies: No results found.      Scheduled Meds: . docusate sodium  100 mg Oral BID  . hydrALAZINE  25 mg Oral BID  . hydrochlorothiazide  25 mg Oral Daily  . levothyroxine  75 mcg Oral QAC breakfast  . sodium chloride  1 spray Each Nare Q2H   Continuous Infusions:   LOS: 0 days    Time spent: 35 minutes.     Elmarie Shiley, MD [facility] Pager [phone]  If 7PM-7AM, please contact night-coverage [email or web address] Password [email or web address] 01/09/2017, 8:01 AM

## 2017-01-09 NOTE — Progress Notes (Signed)
Patient transported to 3W21 from 5C08 with dx: epitaxis. Patient stable, denies pain, shortness of breath or any symptoms. Reviewed labs and notes.

## 2017-01-09 NOTE — Consult Note (Signed)
[place], [name] 72 y.o., female [id number]     Chief Complaint: epistaxis  HPI: Teresa Jensen, onset epistaxis 4 days ago.  Starting and stopping, exact location unsure.  Newly diagnosed HTN.  No ASA or other blood thinners.  No known bleeding tendencies.  Admitted to hospital, bleeding controlled with packing, then discharged home.  Evaluated by Dr. Constance Holster and Dr. Blenda Nicely with site unspecifiec.  Readmitted with persistent/recurrent bleeding.  Balloon pack placed ND earlier today with incomplete control.    PMH: Past Medical History:  Diagnosis Date  . Anxiety   . COPD (chronic obstructive pulmonary disease) ([place])   . Epistaxis 01/09/2017  . GERD (gastroesophageal reflux disease)   . Hypertension   . New onset of headaches   . Osteopenia   . PMR (polymyalgia rheumatica) (HCC)   . Strain of chest wall 01/20/2011  . Tobacco abuse     Surg Hx: Past Surgical History:  Procedure Laterality Date  . ABDOMINAL HYSTERECTOMY    . APPENDECTOMY    . BACK SURGERY    . CARPAL TUNNEL RELEASE Left   . CERVICAL FUSION    . CHOLECYSTECTOMY    . HEMORRHOID SURGERY    . LUMBAR FUSION    . TUBAL LIGATION      FHx:   Family History  Problem Relation Age of Onset  . Heart disease Mother   . Cancer Father        lung/chest wall   . Prostate cancer Father   . Diabetes Brother        siblings  . Hypertension Sister        x 2  . Heart disease Brother   . Breast cancer Paternal Grandmother   . Heart disease Sister   . Diabetes Sister   . Tuberculosis Sister   . Cirrhosis Brother    SocHx:  reports that she quit smoking about 7 years ago. Her smoking use included cigarettes. She has a 60.00 pack-year smoking history. she has never used smokeless tobacco. She reports that she does not drink alcohol or use drugs.  ALLERGIES:  Allergies  Allergen Reactions  . Prednisone Anaphylaxis, Hives, Shortness Of Breath, Itching and Swelling    The face swells  . Milk-Related Compounds   . Prevnar  13 [Pneumococcal 13-Val Conj Vacc] Other (See Comments)    Reaction not recalled by the patient  . Food Rash    MANGO = cause rashes around the mouth    Medications Prior to Admission  Medication Sig Dispense Refill  . levothyroxine (SYNTHROID, LEVOTHROID) 75 MCG tablet Take 1 tablet by mouth every morning on empty stomach with a full glass of water. 90 tablet 3  . albuterol (PROVENTIL HFA;VENTOLIN HFA) 108 (90 Base) MCG/ACT inhaler Inhale 2 puffs into the lungs every 6 (six) hours as needed for wheezing or shortness of breath. (Patient not taking: Reported on 01/06/2017) 1 Inhaler 1    Results for orders placed or performed during the hospital encounter of 01/08/17 (from the past 48 hour(s))  I-stat Chem 8, ED     Status: Abnormal   Collection Time: 01/08/17  4:27 PM  Result Value Ref Range   Sodium 141 135 - 145 mmol/L   Potassium 4.5 3.5 - 5.1 mmol/L   Chloride 101 101 - 111 mmol/L   BUN 9 6 - 20 mg/dL   Creatinine, Ser 0.60 0.44 - 1.00 mg/dL   Glucose, Bld 131 (H) 65 - 99 mg/dL   Calcium, Ion 1.20 1.15 -  1.40 mmol/L   TCO2 28 22 - 32 mmol/L   Hemoglobin 11.2 (L) 12.0 - 15.0 g/dL   HCT 33.0 (L) 36.0 - 87.8 %  Basic metabolic panel     Status: Abnormal   Collection Time: 01/09/17  3:33 AM  Result Value Ref Range   Sodium 137 135 - 145 mmol/L   Potassium 3.7 3.5 - 5.1 mmol/L   Chloride 100 (L) 101 - 111 mmol/L   CO2 29 22 - 32 mmol/L   Glucose, Bld 111 (H) 65 - 99 mg/dL   BUN 8 6 - 20 mg/dL   Creatinine, Ser 0.56 0.44 - 1.00 mg/dL   Calcium 8.7 (L) 8.9 - 10.3 mg/dL   GFR calc non Af Amer >60 >60 mL/min   GFR calc Af Amer >60 >60 mL/min    Comment: (NOTE) The eGFR has been calculated using the CKD EPI equation. This calculation has not been validated in all clinical situations. eGFR's persistently <60 mL/min signify possible Chronic Kidney Disease.    Anion gap 8 5 - 15  CBC     Status: Abnormal   Collection Time: 01/09/17  3:33 AM  Result Value Ref Range   WBC 11.6 (H)  4.0 - 10.5 K/uL   RBC 3.50 (L) 3.87 - 5.11 MIL/uL   Hemoglobin 9.8 (L) 12.0 - 15.0 g/dL   HCT 32.4 (L) 36.0 - 46.0 %   MCV 92.6 78.0 - 100.0 fL   MCH 28.0 26.0 - 34.0 pg   MCHC 30.2 30.0 - 36.0 g/dL   RDW 14.5 11.5 - 15.5 %   Platelets 285 150 - 400 K/uL  CBC     Status: Abnormal   Collection Time: 01/09/17 10:05 AM  Result Value Ref Range   WBC 14.9 (H) 4.0 - 10.5 K/uL   RBC 3.71 (L) 3.87 - 5.11 MIL/uL   Hemoglobin 10.7 (L) 12.0 - 15.0 g/dL   HCT 34.2 (L) 36.0 - 46.0 %   MCV 92.2 78.0 - 100.0 fL   MCH 28.8 26.0 - 34.0 pg   MCHC 31.3 30.0 - 36.0 g/dL   RDW 14.4 11.5 - 15.5 %   Platelets 299 150 - 400 K/uL  Type and screen [place]     Status: None   Collection Time: 01/09/17 10:15 AM  Result Value Ref Range   ABO/RH(D) A POS    Antibody Screen NEG    Sample Expiration 01/12/2017   ABO/Rh     Status: None   Collection Time: 01/09/17 10:15 AM  Result Value Ref Range   ABO/RH(D) A POS   Hemoglobin and hematocrit, blood     Status: Abnormal   Collection Time: 01/09/17  5:16 PM  Result Value Ref Range   Hemoglobin 10.5 (L) 12.0 - 15.0 g/dL   HCT 33.1 (L) 36.0 - 46.0 %   No results found.   Blood pressure (!) 141/54, pulse 100, temperature 98.7 F (37.1 C), temperature source Oral, resp. rate 20, SpO2 90 %.  PHYSICAL EXAM: Overall appearance:  Anxious and fatigued.  Short and heavyset.  Spitting blood from pharynx.  Double balloon pack in RIGHT nose. Head:  NCAT Ears:  Wax AD.  Clear AS Nose:  Sl dry and excoriated LEFT.  After removing balloon pack, posterior bleeding in RIGHT side.  No visible lesion or polyps.   Oral Cavity:  Teeth in good repair Oral Pharynx/Hypopharynx/Larynx:  Old and active blood in RIGHT pharynx Neuro:  Grossly intact Neck:  nl  Using topical 4% xylocaine, then 4 ml topical cocaine on cotton pledgets, the RIGHT nose was anesthetized.  Using the flexible laryngoscope, pulsatile bleeding noted in RIGHT posterior middle meatus,  possible on the lateral surface of the middle turbinate.  1% xylocaine with 1:100,000 epinephrine injected into the lateral wall of the middle meatus and into the middle turbinate itself.  Silver nitrate cautery performed in the posterior middle meatus.  Fibrillar surgicel packed into middle meatus.  Hemostasis observed in nose and pharynx.    Pt tolerated procedure reasonably well.   Assessment/Plan Hypertensive RIGHT posterior middle meatus arteriolar epistaxis, controlled with cautery and packing.    Plan:  Ice, elevation, analgesia.  abx such as Keflex.  Nasal hygiene measures.  Limited activity x 5 days.  Control of HTN.  Recheck my office 2 weeks please.  Jodi Marble 07/03/2200, 7:59 PM

## 2017-01-10 DIAGNOSIS — E038 Other specified hypothyroidism: Secondary | ICD-10-CM

## 2017-01-10 DIAGNOSIS — J449 Chronic obstructive pulmonary disease, unspecified: Secondary | ICD-10-CM

## 2017-01-10 LAB — CBC
HCT: 34 % — ABNORMAL LOW (ref 36.0–46.0)
Hemoglobin: 10.8 g/dL — ABNORMAL LOW (ref 12.0–15.0)
MCH: 29 pg (ref 26.0–34.0)
MCHC: 31.8 g/dL (ref 30.0–36.0)
MCV: 91.4 fL (ref 78.0–100.0)
Platelets: 335 10*3/uL (ref 150–400)
RBC: 3.72 MIL/uL — ABNORMAL LOW (ref 3.87–5.11)
RDW: 14.6 % (ref 11.5–15.5)
WBC: 17.4 10*3/uL — ABNORMAL HIGH (ref 4.0–10.5)

## 2017-01-10 MED ORDER — CEPHALEXIN 250 MG PO CAPS: 250.0000 mg | ORAL_CAPSULE | Freq: Four times a day (QID) | ORAL | 0 refills | Status: AC

## 2017-01-10 MED ORDER — OXYMETAZOLINE HCL 0.05 % NA SOLN: 1.0000 | Freq: Two times a day (BID) | NASAL | 0 refills | Status: DC | PRN

## 2017-01-10 MED ORDER — LISINOPRIL 5 MG PO TABS: 5.0000 mg | ORAL_TABLET | Freq: Every day | ORAL | 11 refills | Status: DC

## 2017-01-10 MED ORDER — NIFEDIPINE ER OSMOTIC RELEASE 30 MG PO TB24: 30.0000 mg | ORAL_TABLET | Freq: Every day | ORAL | 11 refills | Status: DC

## 2017-01-10 NOTE — Discharge Summary (Signed)
Physician Discharge Summary  Patient ID: Teresa Jensen MRN: [id number] DOB/AGE: [birth year] 72 y.o.  Admit date: 01/08/2017 Discharge date: 01/10/2017  Admission Diagnoses:  Discharge Diagnoses:  Active Problems:   Hypothyroidism   COPD (chronic obstructive pulmonary disease) (HCC)   Epistaxis   Essential hypertension   Discharged Condition: stable  Hospital Course: Patient is a 47 year oldfemalewith past medical history significant for Anxiety, COPD, Polymyalgia rheumatica, newly noted elevated blood pressure, who was recently discharge from hospital on 01/08/16 after management for epistaxis. Patient was discharged withRhinorocketon the right side. Patient was readmitted a day after discharge with recurrent nose bleed on the left side. On arrival to the ED, the left sided nose bleed was reported to have stopped. Patient's BP on presentation was 166/36mmHg. Patient was admitted for further assessment and management. ENT was consulted, and ENT recommended Blood Pressure control and Afrin PRN. ENT directed management of patient's nose bleed, and has cleared patient for discharge back home today. Patient is eager to be discharged back home. Patient will be discharged on Nifedipine and Lisinopril for Blood pressure. Patient has been instructed to check her blood pressure 4 times daily, document the readings and share the BP documentation with the PCP in 4 days. The patient and her husband voiced understanding. Holding parameters for the antihypertensive medications were also discussed with the patient and her husband and they voiced understanding.  Consults: ENT  Treatments: See above  Discharge Exam: Blood pressure (!) 141/63, pulse (!) 103, temperature 98 F (36.7 C), temperature source Oral, resp. rate 20, SpO2 93 %.  Disposition: 01-Home or Self Care  Discharge Instructions    Call MD for:   Complete by:  As directed    Call MD if symptoms worsen   Diet - low sodium heart  healthy   Complete by:  As directed    Discharge instructions   Complete by:  As directed    Check BP four times daily at home and document BP readings, share with your PCP in 4 days. Please Hold blood pressure medication for systolic blood pressure less than 170mmHg, and call your PCP to discuss further, or, if you feel dizzy   Increase activity slowly   Complete by:  As directed      Allergies as of 01/10/2017      Reactions   Prednisone Anaphylaxis, Hives, Shortness Of Breath, Itching, Swelling   The face swells   Milk-related Compounds    Prevnar 13 [pneumococcal 13-val Conj Vacc] Other (See Comments)   Reaction not recalled by the patient   Food Rash   MANGO = cause rashes around the mouth      Medication List    STOP taking these medications   albuterol 108 (90 Base) MCG/ACT inhaler Commonly known as:  PROVENTIL HFA;VENTOLIN HFA     TAKE these medications   cephALEXin 250 MG capsule Commonly known as:  KEFLEX Take 1 capsule (250 mg total) by mouth every 6 (six) hours for 5 days.   levothyroxine 75 MCG tablet Commonly known as:  SYNTHROID, LEVOTHROID Take 1 tablet by mouth every morning on empty stomach with a full glass of water.   lisinopril 5 MG tablet Commonly known as:  PRINIVIL,ZESTRIL Take 1 tablet (5 mg total) by mouth daily.   NIFEdipine 30 MG 24 hr tablet Commonly known as:  PROCARDIA-XL/ADALAT-CC/NIFEDICAL-XL Take 1 tablet (30 mg total) by mouth daily.   oxymetazoline 0.05 % nasal spray Commonly known as:  AFRIN Place 1 spray into both  nostrils 2 (two) times daily as needed for congestion (nose bleed).        SignedBonnell Public 01/10/2017, 12:18 PM

## 2017-01-10 NOTE — Plan of Care (Signed)
  Clinical Measurements: Ability to maintain clinical measurements within normal limits will improve 01/10/2017 0454 - Progressing by Irish Lack, RN   Clinical Measurements: Respiratory complications will improve 01/10/2017 0454 - Progressing by Irish Lack, RN   Coping: Level of anxiety will decrease 01/10/2017 0454 - Progressing by Irish Lack, RN

## 2017-01-10 NOTE — Progress Notes (Signed)
01/10/2017 9:06 AM  Dorman, Mikle Bosworth [id number]  Post-Op Day 1    Temp:  [98 F (36.7 C)-99.3 F (37.4 C)] 99.3 F (37.4 C) (01/10 0449) Pulse Rate:  [47-104] 104 (01/10 0449) Resp:  [17-29] 18 (01/10 0449) BP: (129-161)/(50-100) 130/50 (01/10 0630) SpO2:  [90 %-96 %] 92 % (01/10 0449),     Intake/Output Summary (Last 24 hours) at 01/10/2017 0906 Last data filed at 01/09/2017 2224 Gross per 24 hour  Intake 697 ml  Output -  Net 697 ml    Results for orders placed or performed during the hospital encounter of 01/08/17 (from the past 24 hour(s))  CBC     Status: Abnormal   Collection Time: 01/09/17 10:05 AM  Result Value Ref Range   WBC 14.9 (H) 4.0 - 10.5 K/uL   RBC 3.71 (L) 3.87 - 5.11 MIL/uL   Hemoglobin 10.7 (L) 12.0 - 15.0 g/dL   HCT 34.2 (L) 36.0 - 46.0 %   MCV 92.2 78.0 - 100.0 fL   MCH 28.8 26.0 - 34.0 pg   MCHC 31.3 30.0 - 36.0 g/dL   RDW 14.4 11.5 - 15.5 %   Platelets 299 150 - 400 K/uL  Type and screen [place]     Status: None   Collection Time: 01/09/17 10:15 AM  Result Value Ref Range   ABO/RH(D) A POS    Antibody Screen NEG    Sample Expiration 01/12/2017   ABO/Rh     Status: None   Collection Time: 01/09/17 10:15 AM  Result Value Ref Range   ABO/RH(D) A POS   Hemoglobin and hematocrit, blood     Status: Abnormal   Collection Time: 01/09/17  5:16 PM  Result Value Ref Range   Hemoglobin 10.5 (L) 12.0 - 15.0 g/dL   HCT 33.1 (L) 36.0 - 46.0 %  CBC     Status: Abnormal   Collection Time: 01/10/17  6:29 AM  Result Value Ref Range   WBC 17.4 (H) 4.0 - 10.5 K/uL   RBC 3.72 (L) 3.87 - 5.11 MIL/uL   Hemoglobin 10.8 (L) 12.0 - 15.0 g/dL   HCT 34.0 (L) 36.0 - 46.0 %   MCV 91.4 78.0 - 100.0 fL   MCH 29.0 26.0 - 34.0 pg   MCHC 31.8 30.0 - 36.0 g/dL   RDW 14.6 11.5 - 15.5 %   Platelets 335 150 - 400 K/uL    SUBJECTIVE:  No more bleeding.  Some headache  OBJECTIVE:   No blood in nose or pharynx.  IMPRESSION:  Controlled  hypertensive epistaxis  PLAN:  Nasal hygiene measures.  Control HTN.  Recheck my office 2 weeks please.OK for discharge from my standpoint.  Teresa Jensen

## 2017-01-10 NOTE — Care Management Note (Signed)
Case Management Note  Patient Details  Name: Teresa Jensen MRN: [id number] Date of Birth: 1945-01-02  Subjective/Objective:                    Action/Plan: Pt discharging home with self care. Pt has PCP, insurance and transportation home. No further needs per CM.    Expected Discharge Date:  01/10/17               Expected Discharge Plan:  Home/Self Care  In-House Referral:     Discharge planning Services     Post Acute Care Choice:    Choice offered to:     DME Arranged:    DME Agency:     HH Arranged:    HH Agency:     Status of Service:  Completed, signed off  If discussed at [employer] of Stay Meetings, dates discussed:    Additional Comments:  Pollie Friar, RN 01/10/2017, 12:34 PM

## 2017-01-10 NOTE — Progress Notes (Signed)
Discharge instructions reviewed with pt and husband.  Copy of instructions given to pt, her scripts were sent to her pharmacy by MD.  Pt d/c'd via wheelchair with belongings, with her husband.              Escorted by unit NT.

## 2017-01-10 NOTE — Care Management Note (Signed)
Case Management Note  Patient Details  Name: Teresa Jensen MRN: [id number] Date of Birth: Feb 09, 1945  Subjective/Objective:       Pt admitted with epistaxis that persisted after treatment. She is from home with spouse.              Action/Plan: Plan is for patient to return home when medically stable. CM following for d/c needs, physician orders.   Expected Discharge Date:                  Expected Discharge Plan:  Home/Self Care  In-House Referral:     Discharge planning Services     Post Acute Care Choice:    Choice offered to:     DME Arranged:    DME Agency:     HH Arranged:    HH Agency:     Status of Service:  In process, will continue to follow  If discussed at Long Length of Stay Meetings, dates discussed:    Additional Comments:  Pollie Friar, RN 01/10/2017, 11:08 AM

## 2017-01-11 ENCOUNTER — Inpatient Hospital Stay (HOSPITAL_COMMUNITY)
Admission: EM | Admit: 2017-01-11 | Discharge: 2017-01-14 | DRG: 683 | Disposition: A | Discharge status: Home / Self Care | Payer: Medicare Other | Attending: Internal Medicine | Admitting: Internal Medicine

## 2017-01-11 ENCOUNTER — Telehealth: Payer: Self-pay | Admitting: *Deleted

## 2017-01-11 ENCOUNTER — Encounter (HOSPITAL_COMMUNITY): Payer: Self-pay

## 2017-01-11 ENCOUNTER — Other Ambulatory Visit: Payer: Self-pay

## 2017-01-11 DIAGNOSIS — N179 Acute kidney failure, unspecified: Principal | ICD-10-CM | POA: Diagnosis present

## 2017-01-11 DIAGNOSIS — D72829 Elevated white blood cell count, unspecified: Secondary | ICD-10-CM

## 2017-01-11 DIAGNOSIS — M353 Polymyalgia rheumatica: Secondary | ICD-10-CM | POA: Diagnosis present

## 2017-01-11 DIAGNOSIS — E876 Hypokalemia: Secondary | ICD-10-CM | POA: Diagnosis present

## 2017-01-11 DIAGNOSIS — I10 Essential (primary) hypertension: Secondary | ICD-10-CM | POA: Diagnosis present

## 2017-01-11 DIAGNOSIS — D649 Anemia, unspecified: Secondary | ICD-10-CM | POA: Diagnosis present

## 2017-01-11 DIAGNOSIS — J449 Chronic obstructive pulmonary disease, unspecified: Secondary | ICD-10-CM | POA: Diagnosis present

## 2017-01-11 DIAGNOSIS — E871 Hypo-osmolality and hyponatremia: Secondary | ICD-10-CM | POA: Diagnosis present

## 2017-01-11 DIAGNOSIS — I959 Hypotension, unspecified: Secondary | ICD-10-CM | POA: Diagnosis present

## 2017-01-11 DIAGNOSIS — E538 Deficiency of other specified B group vitamins: Secondary | ICD-10-CM | POA: Diagnosis present

## 2017-01-11 DIAGNOSIS — F419 Anxiety disorder, unspecified: Secondary | ICD-10-CM | POA: Diagnosis present

## 2017-01-11 DIAGNOSIS — K219 Gastro-esophageal reflux disease without esophagitis: Secondary | ICD-10-CM | POA: Diagnosis present

## 2017-01-11 DIAGNOSIS — Z87891 Personal history of nicotine dependence: Secondary | ICD-10-CM

## 2017-01-11 DIAGNOSIS — D72828 Other elevated white blood cell count: Secondary | ICD-10-CM | POA: Diagnosis present

## 2017-01-11 DIAGNOSIS — R7989 Other specified abnormal findings of blood chemistry: Secondary | ICD-10-CM | POA: Diagnosis present

## 2017-01-11 DIAGNOSIS — I9589 Other hypotension: Secondary | ICD-10-CM | POA: Diagnosis present

## 2017-01-11 DIAGNOSIS — Z888 Allergy status to other drugs, medicaments and biological substances status: Secondary | ICD-10-CM

## 2017-01-11 DIAGNOSIS — I952 Hypotension due to drugs: Secondary | ICD-10-CM | POA: Diagnosis present

## 2017-01-11 DIAGNOSIS — E039 Hypothyroidism, unspecified: Secondary | ICD-10-CM | POA: Diagnosis present

## 2017-01-11 DIAGNOSIS — Z79899 Other long term (current) drug therapy: Secondary | ICD-10-CM

## 2017-01-11 DIAGNOSIS — T464X5A Adverse effect of angiotensin-converting-enzyme inhibitors, initial encounter: Secondary | ICD-10-CM | POA: Diagnosis present

## 2017-01-11 DIAGNOSIS — Z887 Allergy status to serum and vaccine status: Secondary | ICD-10-CM

## 2017-01-11 LAB — BASIC METABOLIC PANEL
Anion gap: 14 (ref 5–15)
BUN: 27 mg/dL — ABNORMAL HIGH (ref 6–20)
CO2: 27 mmol/L (ref 22–32)
Calcium: 8.8 mg/dL — ABNORMAL LOW (ref 8.9–10.3)
Chloride: 90 mmol/L — ABNORMAL LOW (ref 101–111)
Creatinine, Ser: 2.25 mg/dL — ABNORMAL HIGH (ref 0.44–1.00)
GFR calc Af Amer: 24 mL/min — ABNORMAL LOW (ref >60)
GFR calc non Af Amer: 21 mL/min — ABNORMAL LOW (ref >60)
Glucose, Bld: 152 mg/dL — ABNORMAL HIGH (ref 65–99)
Potassium: 2.9 mmol/L — ABNORMAL LOW (ref 3.5–5.1)
Sodium: 131 mmol/L — ABNORMAL LOW (ref 135–145)

## 2017-01-11 LAB — CBC
HCT: 30.2 % — ABNORMAL LOW (ref 36.0–46.0)
Hemoglobin: 9.8 g/dL — ABNORMAL LOW (ref 12.0–15.0)
MCH: 29.8 pg (ref 26.0–34.0)
MCHC: 32.5 g/dL (ref 30.0–36.0)
MCV: 91.8 fL (ref 78.0–100.0)
Platelets: 426 10*3/uL — ABNORMAL HIGH (ref 150–400)
RBC: 3.29 MIL/uL — ABNORMAL LOW (ref 3.87–5.11)
RDW: 15.1 % (ref 11.5–15.5)
WBC: 26.1 10*3/uL — ABNORMAL HIGH (ref 4.0–10.5)

## 2017-01-11 LAB — I-STAT CG4 LACTIC ACID, ED: Lactic Acid, Venous: 2.43 mmol/L (ref 0.5–1.9)

## 2017-01-11 LAB — CBG MONITORING, ED: Glucose-Capillary: 151 mg/dL — ABNORMAL HIGH (ref 65–99)

## 2017-01-11 MED ORDER — SODIUM CHLORIDE 0.9 % IV SOLN
1.0000 g | Freq: Once | INTRAVENOUS | Status: AC
  Administered 2017-01-11: 1 g via INTRAVENOUS
  Filled 2017-01-11: qty 10

## 2017-01-11 MED ORDER — POTASSIUM CHLORIDE CRYS ER 20 MEQ PO TBCR
40.0000 meq | EXTENDED_RELEASE_TABLET | Freq: Once | ORAL | Status: AC
  Administered 2017-01-12: 40 meq via ORAL
  Filled 2017-01-11: qty 2

## 2017-01-11 MED ORDER — SODIUM CHLORIDE 0.9 % IV SOLN
1.0000 g | Freq: Once | INTRAVENOUS | Status: AC
  Administered 2017-01-12: 1 g via INTRAVENOUS
  Filled 2017-01-11: qty 10

## 2017-01-11 MED ORDER — SODIUM CHLORIDE 0.9 % IV BOLUS (SEPSIS)
1000.0000 mL | Freq: Once | INTRAVENOUS | Status: AC
  Administered 2017-01-11: 1000 mL via INTRAVENOUS

## 2017-01-11 MED ORDER — LACTATED RINGERS IV BOLUS (SEPSIS)
1000.0000 mL | Freq: Once | INTRAVENOUS | Status: AC
  Administered 2017-01-11: 1000 mL via INTRAVENOUS

## 2017-01-11 NOTE — ED Notes (Signed)
Drank gingerale with no difficulties

## 2017-01-11 NOTE — ED Notes (Signed)
Unable to obtain IV access, assistance requested

## 2017-01-11 NOTE — ED Triage Notes (Signed)
Patient arrives by [facility] with complaints of weakness and nausea after being discharged from [facility] yesterday afternoon. Patient has a packed right nare from previous nosebleed. SBP now in the 90's.

## 2017-01-11 NOTE — ED Provider Notes (Signed)
[place] DEPT Provider Note   CSN: [id number] Arrival date & time: 01/11/17  1931     History   Chief Complaint Chief Complaint  Patient presents with  . Fatigue    HPI Teresa Jensen is a 72 y.o. female.  HPI  Pt comes in with cc of dizziness, weakness. Patient has history of COPD, and was recently admitted to the hospital for epistaxis.  While in the hospital patient was diagnosed with hypertension and started on nifedipine and lisinopril.  Patient reports that after she took her medication this morning she started getting dizzy.  Patient has had persistent dizziness and nausea, and she called [facility] and was advised to come to the ER.  Patient was noted to be hypotensive at arrival.  Patient denies any fevers, chills, abdominal pain, UTI-like symptoms, confusion, facial pain.  Patient is taking Keflex at this time for the nasal packing that she had.  Patient's dizziness is present mostly when she is standing up or when she gets up.  She does not have any new numbness, tingling, weakness, vision changes.  Past Medical History:  Diagnosis Date  . Anxiety   . COPD (chronic obstructive pulmonary disease) ([place])   . Epistaxis 01/09/2017  . GERD (gastroesophageal reflux disease)   . Hypertension   . New onset of headaches   . Osteopenia   . PMR (polymyalgia rheumatica) (HCC)   . Strain of chest wall 01/20/2011  . Tobacco abuse     Patient Active Problem List   Diagnosis Date Noted  . Essential hypertension 01/08/2017  . Epistaxis 01/07/2017  . Elevated blood pressure reading 01/07/2017  . Prediabetes 10/17/2015  . Herpes zoster 07/08/2015  . Pain of right lower extremity 05/20/2015  . 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 Medicare annual wellness visit, subsequent 10/12/2014  . Occipital neuralgia 10/12/2014  . Vitamin D deficiency 10/12/2014  . COPD (chronic obstructive pulmonary disease) ([place]) 06/05/2010  . Hypothyroidism  09/29/2009  . Situational anxiety 10/01/2008  . GERD 10/01/2008  . Osteoarthritis 10/01/2008    Past Surgical History:  Procedure Laterality Date  . ABDOMINAL HYSTERECTOMY    . APPENDECTOMY    . BACK SURGERY    . CARPAL TUNNEL RELEASE Left   . CERVICAL FUSION    . CHOLECYSTECTOMY    . HEMORRHOID SURGERY    . LUMBAR FUSION    . TUBAL LIGATION      OB History    No data available       Home Medications    Prior to Admission medications   Medication Sig Start Date End Date Taking? Authorizing Provider  cephALEXin (KEFLEX) 250 MG capsule Take 1 capsule (250 mg total) by mouth every 6 (six) hours for 5 days. 01/10/17 01/15/17 Yes Bonnell Public, MD  levothyroxine (SYNTHROID, LEVOTHROID) 75 MCG tablet Take 1 tablet by mouth every morning on empty stomach with a full glass of water. 12/05/16  Yes Pleas Koch, NP  lisinopril (PRINIVIL,ZESTRIL) 5 MG tablet Take 1 tablet (5 mg total) by mouth daily. 01/10/17 01/10/18 Yes Dana Allan I, MD  NIFEdipine (PROCARDIA-XL/ADALAT-CC/NIFEDICAL-XL) 30 MG 24 hr tablet Take 1 tablet (30 mg total) by mouth daily. 01/10/17 01/10/18 Yes Bonnell Public, MD  oxymetazoline (AFRIN) 0.05 % nasal spray Place 1 spray into both nostrils 2 (two) times daily as needed for congestion (nose bleed). 01/10/17  Yes Bonnell Public, MD    Family History Family History  Problem Relation Age of Onset  .  Heart disease Mother   . Cancer Father        lung/chest wall   . Prostate cancer Father   . Diabetes Brother        siblings  . Hypertension Sister        x 2  . Heart disease Brother   . Breast cancer Paternal Grandmother   . Heart disease Sister   . Diabetes Sister   . Tuberculosis Sister   . Cirrhosis Brother     Social History Social History   Tobacco Use  . Smoking status: Former Smoker    Packs/day: 1.50    Years: 40.00    Pack years: 60.00    Types: Cigarettes    Last attempt to quit: 02/11/2009    Years since quitting:  7.9  . Smokeless tobacco: Never Used  Substance Use Topics  . Alcohol use: No    Alcohol/week: 0.0 oz  . Drug use: No     Allergies   Prednisone; Milk-related compounds; Prevnar 13 [pneumococcal 13-val conj vacc]; and Food   Review of Systems Review of Systems  Constitutional: Positive for activity change.  Neurological: Positive for light-headedness.  All other systems reviewed and are negative.   Physical Exam Updated Vital Signs BP (!) 112/39   Pulse 66   Temp 97.8 F (36.6 C) (Oral)   Resp 15   SpO2 98%   Physical Exam  Constitutional: She is oriented to person, place, and time. She appears well-developed.  HENT:  Head: Normocephalic and atraumatic.  Eyes: EOM are normal.  Neck: Normal range of motion. Neck supple.  Cardiovascular: Normal rate.  Pulmonary/Chest: Effort normal.  Abdominal: Bowel sounds are normal.  Neurological: She is alert and oriented to person, place, and time.  Skin: Skin is warm and dry.  Nursing note and vitals reviewed.   ED Treatments / Results  Labs (all labs ordered are listed, but only abnormal results are displayed) Labs Reviewed  BASIC METABOLIC PANEL - Abnormal; Notable for the following components:      Result Value   Sodium 131 (*)    Potassium 2.9 (*)    Chloride 90 (*)    Glucose, Bld 152 (*)    BUN 27 (*)    Creatinine, Ser 2.25 (*)    Calcium 8.8 (*)    GFR calc non Af Amer 21 (*)    GFR calc Af Amer 24 (*)    All other components within normal limits  CBC - Abnormal; Notable for the following components:   WBC 26.1 (*)    RBC 3.29 (*)    Hemoglobin 9.8 (*)    HCT 30.2 (*)    Platelets 426 (*)    All other components within normal limits  CBG MONITORING, ED - Abnormal; Notable for the following components:   Glucose-Capillary 151 (*)    All other components within normal limits  I-STAT CG4 LACTIC ACID, ED - Abnormal; Notable for the following components:   Lactic Acid, Venous 2.43 (*)    All other  components within normal limits  URINALYSIS, ROUTINE W REFLEX MICROSCOPIC    EKG  EKG Interpretation  Date/Time:  Friday January 11 2017 20:20:45 EST Ventricular Rate:  73 PR Interval:    QRS Duration: 110 QT Interval:  484 QTC Calculation: 534 R Axis:   62 Text Interpretation:  Sinus rhythm LVH with IVCD and secondary repol abnrm Prolonged QT interval No acute changes No significant change since last tracing Confirmed by Kathrynn Humble, Bralyn Folkert (  [id number]) on 01/11/2017 8:47:54 PM       Radiology No results found.  Procedures Procedures (including critical care time)  Medications Ordered in ED Medications  lactated ringers bolus 1,000 mL (1,000 mLs Intravenous New Bag/Given 01/11/17 2108)  sodium chloride 0.9 % bolus 1,000 mL (1,000 mLs Intravenous New Bag/Given 01/11/17 2239)  calcium gluconate 1 g in sodium chloride 0.9 % 100 mL IVPB (0 g Intravenous Stopped 01/11/17 2204)     Initial Impression / Assessment and Plan / ED Course  I have reviewed the triage vital signs and the nursing notes.  Pertinent labs & imaging results that were available during my care of the patient were reviewed by me and considered in my medical decision making (see chart for details).  Clinical Course as of Jan 11 2298  Fri Jan 11, 2017  2300 Patient is AK I.  Her blood pressure has improved with IV fluids and calcium gluconate. We will admit for renal failure, and medication adjustment. Creatinine: (!) 2.25 [AN]    Clinical Course User Index [AN] Varney Biles, MD   Patient comes in with chief complaint of dizziness. Patient is noted to be hypotensive, and her dizziness is intermittent, mainly present when she gets up. On my exam patient has no focal neurologic deficits.  Patient was a started on nifedipine and lisinopril during her admission, and I suspect that her hypotension is iatrogenic in nature.  We will get basic labs and reassess.   Final Clinical Impressions(s) / ED Diagnoses   Final  diagnoses:  Iatrogenic hypotension  AKI (acute kidney injury) [facility])    ED Discharge Orders    None       Varney Biles, MD 01/11/17 2301

## 2017-01-11 NOTE — Telephone Encounter (Signed)
Attempted to contact pt and complete TCM. Pt was scheduled for 1/14 f/u for 1/6 hospitalization, but states she cancelled and is not wanting to be seen at this time b/c she "doesn't feel like it." Pt also refused completion of TCM questionnaire

## 2017-01-11 NOTE — ED Triage Notes (Signed)
Patient started on an ace inhibitor and calcium channel blocker during hospitalization

## 2017-01-11 NOTE — Telephone Encounter (Addendum)
Noted. Hospital visit reviewed. She will be following up with ENT.

## 2017-01-12 ENCOUNTER — Encounter (HOSPITAL_COMMUNITY): Payer: Self-pay | Admitting: *Deleted

## 2017-01-12 ENCOUNTER — Inpatient Hospital Stay (HOSPITAL_COMMUNITY): Payer: Medicare Other

## 2017-01-12 ENCOUNTER — Other Ambulatory Visit: Payer: Self-pay

## 2017-01-12 DIAGNOSIS — T464X5A Adverse effect of angiotensin-converting-enzyme inhibitors, initial encounter: Secondary | ICD-10-CM | POA: Diagnosis present

## 2017-01-12 DIAGNOSIS — J449 Chronic obstructive pulmonary disease, unspecified: Secondary | ICD-10-CM | POA: Diagnosis not present

## 2017-01-12 DIAGNOSIS — Z887 Allergy status to serum and vaccine status: Secondary | ICD-10-CM | POA: Diagnosis not present

## 2017-01-12 DIAGNOSIS — N179 Acute kidney failure, unspecified: Secondary | ICD-10-CM

## 2017-01-12 DIAGNOSIS — E038 Other specified hypothyroidism: Secondary | ICD-10-CM

## 2017-01-12 DIAGNOSIS — D72828 Other elevated white blood cell count: Secondary | ICD-10-CM | POA: Diagnosis present

## 2017-01-12 DIAGNOSIS — D649 Anemia, unspecified: Secondary | ICD-10-CM | POA: Diagnosis not present

## 2017-01-12 DIAGNOSIS — I9589 Other hypotension: Secondary | ICD-10-CM

## 2017-01-12 DIAGNOSIS — Z888 Allergy status to other drugs, medicaments and biological substances status: Secondary | ICD-10-CM | POA: Diagnosis not present

## 2017-01-12 DIAGNOSIS — Z87891 Personal history of nicotine dependence: Secondary | ICD-10-CM | POA: Diagnosis not present

## 2017-01-12 DIAGNOSIS — E538 Deficiency of other specified B group vitamins: Secondary | ICD-10-CM | POA: Diagnosis not present

## 2017-01-12 DIAGNOSIS — E039 Hypothyroidism, unspecified: Secondary | ICD-10-CM | POA: Diagnosis present

## 2017-01-12 DIAGNOSIS — I959 Hypotension, unspecified: Secondary | ICD-10-CM | POA: Diagnosis present

## 2017-01-12 DIAGNOSIS — M353 Polymyalgia rheumatica: Secondary | ICD-10-CM | POA: Diagnosis present

## 2017-01-12 DIAGNOSIS — D72829 Elevated white blood cell count, unspecified: Secondary | ICD-10-CM

## 2017-01-12 DIAGNOSIS — E871 Hypo-osmolality and hyponatremia: Secondary | ICD-10-CM | POA: Diagnosis not present

## 2017-01-12 DIAGNOSIS — I10 Essential (primary) hypertension: Secondary | ICD-10-CM | POA: Diagnosis present

## 2017-01-12 DIAGNOSIS — K219 Gastro-esophageal reflux disease without esophagitis: Secondary | ICD-10-CM | POA: Diagnosis not present

## 2017-01-12 DIAGNOSIS — E876 Hypokalemia: Secondary | ICD-10-CM | POA: Diagnosis present

## 2017-01-12 DIAGNOSIS — I952 Hypotension due to drugs: Secondary | ICD-10-CM | POA: Diagnosis present

## 2017-01-12 DIAGNOSIS — Z79899 Other long term (current) drug therapy: Secondary | ICD-10-CM | POA: Diagnosis not present

## 2017-01-12 DIAGNOSIS — F419 Anxiety disorder, unspecified: Secondary | ICD-10-CM | POA: Diagnosis present

## 2017-01-12 HISTORY — DX: Acute kidney failure, unspecified: N17.9

## 2017-01-12 HISTORY — DX: Other hypotension: I95.89

## 2017-01-12 LAB — CBC WITH DIFFERENTIAL/PLATELET
Basophils Absolute: 0 10*3/uL (ref 0.0–0.1)
Basophils Relative: 0 %
Eosinophils Absolute: 0.1 10*3/uL (ref 0.0–0.7)
Eosinophils Relative: 0 %
HCT: 27 % — ABNORMAL LOW (ref 36.0–46.0)
Hemoglobin: 8.8 g/dL — ABNORMAL LOW (ref 12.0–15.0)
Lymphocytes Relative: 13 %
Lymphs Abs: 2.3 10*3/uL (ref 0.7–4.0)
MCH: 29.9 pg (ref 26.0–34.0)
MCHC: 32.6 g/dL (ref 30.0–36.0)
MCV: 91.8 fL (ref 78.0–100.0)
Monocytes Absolute: 1.6 10*3/uL — ABNORMAL HIGH (ref 0.1–1.0)
Monocytes Relative: 9 %
Neutro Abs: 14.6 10*3/uL — ABNORMAL HIGH (ref 1.7–7.7)
Neutrophils Relative %: 78 %
Platelets: 382 10*3/uL (ref 150–400)
RBC: 2.94 MIL/uL — ABNORMAL LOW (ref 3.87–5.11)
RDW: 15.2 % (ref 11.5–15.5)
WBC: 18.7 10*3/uL — ABNORMAL HIGH (ref 4.0–10.5)

## 2017-01-12 LAB — URINALYSIS, ROUTINE W REFLEX MICROSCOPIC
Bilirubin Urine: NEGATIVE
Glucose, UA: NEGATIVE mg/dL
Ketones, ur: NEGATIVE mg/dL
Nitrite: NEGATIVE
Protein, ur: NEGATIVE mg/dL
Specific Gravity, Urine: 1.006 (ref 1.005–1.030)
pH: 5 (ref 5.0–8.0)

## 2017-01-12 LAB — VITAMIN B12: Vitamin B-12: 320 pg/mL (ref 180–914)

## 2017-01-12 LAB — BASIC METABOLIC PANEL
Anion gap: 10 (ref 5–15)
BUN: 27 mg/dL — ABNORMAL HIGH (ref 6–20)
CO2: 23 mmol/L (ref 22–32)
Calcium: 8.4 mg/dL — ABNORMAL LOW (ref 8.9–10.3)
Chloride: 98 mmol/L — ABNORMAL LOW (ref 101–111)
Creatinine, Ser: 1.83 mg/dL — ABNORMAL HIGH (ref 0.44–1.00)
GFR calc Af Amer: 31 mL/min — ABNORMAL LOW (ref >60)
GFR calc non Af Amer: 27 mL/min — ABNORMAL LOW (ref >60)
Glucose, Bld: 148 mg/dL — ABNORMAL HIGH (ref 65–99)
Potassium: 3.2 mmol/L — ABNORMAL LOW (ref 3.5–5.1)
Sodium: 131 mmol/L — ABNORMAL LOW (ref 135–145)

## 2017-01-12 LAB — RETICULOCYTES
RBC.: 2.94 MIL/uL — ABNORMAL LOW (ref 3.87–5.11)
Retic Count, Absolute: 161.7 10*3/uL (ref 19.0–186.0)
Retic Ct Pct: 5.5 % — ABNORMAL HIGH (ref 0.4–3.1)

## 2017-01-12 LAB — IRON AND TIBC
Iron: 24 ug/dL — ABNORMAL LOW (ref 28–170)
Saturation Ratios: 8 % — ABNORMAL LOW (ref 10.4–31.8)
TIBC: 288 ug/dL (ref 250–450)
UIBC: 264 ug/dL

## 2017-01-12 LAB — LACTIC ACID, PLASMA
Lactic Acid, Venous: 2 mmol/L (ref 0.5–1.9)
Lactic Acid, Venous: 1.7 mmol/L (ref 0.5–1.9)

## 2017-01-12 LAB — FERRITIN: Ferritin: 85 ng/mL (ref 11–307)

## 2017-01-12 LAB — MAGNESIUM: Magnesium: 1.8 mg/dL (ref 1.7–2.4)

## 2017-01-12 LAB — FOLATE: Folate: 9.2 ng/mL (ref >5.9)

## 2017-01-12 MED ORDER — SODIUM CHLORIDE 0.9 % IV SOLN: INTRAVENOUS | Status: DC

## 2017-01-12 MED ORDER — OXYMETAZOLINE HCL 0.05 % NA SOLN
1.0000 | Freq: Two times a day (BID) | NASAL | Status: DC | PRN
  Administered 2017-01-13: 1 via NASAL
  Filled 2017-01-12: qty 15

## 2017-01-12 MED ORDER — SODIUM CHLORIDE 0.9 % IV SOLN
INTRAVENOUS | Status: DC
  Administered 2017-01-12 (×2): via INTRAVENOUS

## 2017-01-12 MED ORDER — ONDANSETRON HCL 4 MG PO TABS: 4.0000 mg | ORAL_TABLET | Freq: Four times a day (QID) | ORAL | Status: DC | PRN

## 2017-01-12 MED ORDER — LEVOTHYROXINE SODIUM 50 MCG PO TABS
75.0000 ug | ORAL_TABLET | Freq: Every day | ORAL | Status: DC
  Administered 2017-01-12 – 2017-01-14 (×3): 75 ug via ORAL
  Filled 2017-01-12 (×4): qty 1

## 2017-01-12 MED ORDER — ACETAMINOPHEN 650 MG RE SUPP: 650.0000 mg | Freq: Four times a day (QID) | RECTAL | Status: DC | PRN

## 2017-01-12 MED ORDER — CEPHALEXIN 250 MG PO CAPS
250.0000 mg | ORAL_CAPSULE | Freq: Four times a day (QID) | ORAL | Status: DC
  Administered 2017-01-12 – 2017-01-14 (×10): 250 mg via ORAL
  Filled 2017-01-12 (×11): qty 1

## 2017-01-12 MED ORDER — ACETAMINOPHEN 325 MG PO TABS: 650.0000 mg | ORAL_TABLET | Freq: Four times a day (QID) | ORAL | Status: DC | PRN

## 2017-01-12 MED ORDER — MAGNESIUM SULFATE 4 GM/100ML IV SOLN
4.0000 g | Freq: Once | INTRAVENOUS | Status: AC
  Administered 2017-01-12: 4 g via INTRAVENOUS
  Filled 2017-01-12: qty 100

## 2017-01-12 MED ORDER — ONDANSETRON HCL 4 MG/2ML IJ SOLN: 4.0000 mg | Freq: Four times a day (QID) | INTRAMUSCULAR | Status: DC | PRN

## 2017-01-12 MED ORDER — POTASSIUM CHLORIDE CRYS ER 20 MEQ PO TBCR
40.0000 meq | EXTENDED_RELEASE_TABLET | ORAL | Status: AC
  Administered 2017-01-12 (×2): 40 meq via ORAL
  Filled 2017-01-12: qty 2

## 2017-01-12 NOTE — ED Notes (Signed)
ED TO INPATIENT HANDOFF REPORT  Name/Age/Gender Teresa Jensen 72 y.o. female  Code Status    Code Status Orders  (From admission, onward)        Start     Ordered   01/12/17 0008  Full code  Continuous     01/12/17 0008    Code Status History    Date Active Date Inactive Code Status Order ID Comments User Context   01/08/2017 19:35 01/10/2017 16:28 Full Code [id number]  Elmarie Shiley, MD ED   01/07/2017 00:43 01/07/2017 14:58 Full Code [id number]  Rise Patience, MD ED      Home/SNF/Other Home  Chief Complaint Nausea/weakness   Level of Care/Admitting Diagnosis ED Disposition    ED Disposition Condition [facility]: [facility] [[id number]]  Level of Care: Stepdown [14]  Admit to SDU based on following criteria: Hemodynamic compromise or significant risk of instability:  Patient requiring short term acute titration and management of vasoactive drips, and invasive monitoring (i.e., CVP and Arterial line).  Diagnosis: Iatrogenic hypotension [737106]  Admitting Physician: Doreatha Massed  Attending Physician: Etta Quill [phone]  Estimated length of stay: past midnight tomorrow  Certification:: I certify this patient will need inpatient services for at least 2 midnights  PT Class (Do Not Modify): Inpatient [101]  PT Acc Code (Do Not Modify): Private [1]       Medical History Past Medical History:  Diagnosis Date  . Anxiety   . COPD (chronic obstructive pulmonary disease) ([place])   . Epistaxis 01/09/2017  . GERD (gastroesophageal reflux disease)   . Hypertension   . New onset of headaches   . Osteopenia   . PMR (polymyalgia rheumatica) (HCC)   . Strain of chest wall 01/20/2011  . Tobacco abuse     Allergies Allergies  Allergen Reactions  . Prednisone Anaphylaxis, Hives, Shortness Of Breath, Itching and Swelling    The face swells  . Milk-Related Compounds   . Prevnar 13 [Pneumococcal 13-Val Conj Vacc]  Other (See Comments)    Reaction not recalled by the patient  . Food Rash    MANGO = cause rashes around the mouth    IV Location/Drains/Wounds Patient Lines/Drains/Airways Status   Active Line/Drains/Airways    Name:   Placement date:   Placement time:   Site:   Days:   Peripheral IV 01/11/17 Left Forearm   01/11/17    2058    Forearm   1          Labs/Imaging Results for orders placed or performed during the hospital encounter of 01/11/17 (from the past 48 hour(s))  Basic metabolic panel     Status: Abnormal   Collection Time: 01/11/17  8:55 PM  Result Value Ref Range   Sodium 131 (L) 135 - 145 mmol/L   Potassium 2.9 (L) 3.5 - 5.1 mmol/L   Chloride 90 (L) 101 - 111 mmol/L   CO2 27 22 - 32 mmol/L   Glucose, Bld 152 (H) 65 - 99 mg/dL   BUN 27 (H) 6 - 20 mg/dL   Creatinine, Ser 2.25 (H) 0.44 - 1.00 mg/dL   Calcium 8.8 (L) 8.9 - 10.3 mg/dL   GFR calc non Af Amer 21 (L) >60 mL/min   GFR calc Af Amer 24 (L) >60 mL/min    Comment: (NOTE) The eGFR has been calculated using the CKD EPI equation. This calculation has not been validated in all clinical situations. eGFR's persistently <  60 mL/min signify possible Chronic Kidney Disease.    Anion gap 14 5 - 15  CBC     Status: Abnormal   Collection Time: 01/11/17  8:55 PM  Result Value Ref Range   WBC 26.1 (H) 4.0 - 10.5 K/uL   RBC 3.29 (L) 3.87 - 5.11 MIL/uL   Hemoglobin 9.8 (L) 12.0 - 15.0 g/dL   HCT 30.2 (L) 36.0 - 46.0 %   MCV 91.8 78.0 - 100.0 fL   MCH 29.8 26.0 - 34.0 pg   MCHC 32.5 30.0 - 36.0 g/dL   RDW 15.1 11.5 - 15.5 %   Platelets 426 (H) 150 - 400 K/uL  I-Stat CG4 Lactic Acid, ED     Status: Abnormal   Collection Time: 01/11/17  9:07 PM  Result Value Ref Range   Lactic Acid, Venous 2.43 (HH) 0.5 - 1.9 mmol/L   Comment NOTIFIED PHYSICIAN   CBG monitoring, ED     Status: Abnormal   Collection Time: 01/11/17  9:30 PM  Result Value Ref Range   Glucose-Capillary 151 (H) 65 - 99 mg/dL  Urinalysis, Routine w reflex  microscopic     Status: Abnormal   Collection Time: 01/12/17  3:30 AM  Result Value Ref Range   Color, Urine YELLOW YELLOW   APPearance HAZY (A) CLEAR   Specific Gravity, Urine 1.006 1.005 - 1.030   pH 5.0 5.0 - 8.0   Glucose, UA NEGATIVE NEGATIVE mg/dL   Hgb urine dipstick SMALL (A) NEGATIVE   Bilirubin Urine NEGATIVE NEGATIVE   Ketones, ur NEGATIVE NEGATIVE mg/dL   Protein, ur NEGATIVE NEGATIVE mg/dL   Nitrite NEGATIVE NEGATIVE   Leukocytes, UA TRACE (A) NEGATIVE   RBC / HPF 0-5 0 - 5 RBC/hpf   WBC, UA 0-5 0 - 5 WBC/hpf   Bacteria, UA RARE (A) NONE SEEN   Squamous Epithelial / LPF 6-30 (A) NONE SEEN   Mucus PRESENT    Hyaline Casts, UA PRESENT   Lactic acid, plasma     Status: Abnormal   Collection Time: 01/12/17  5:32 AM  Result Value Ref Range   Lactic Acid, Venous 2.0 (HH) 0.5 - 1.9 mmol/L    Comment: CRITICAL RESULT CALLED TO, READ BACK BY AND VERIFIED WITH: P DOWD,RN 01/12/17 0642 RHOLMES   Lactic acid, plasma     Status: None   Collection Time: 01/12/17  7:43 AM  Result Value Ref Range   Lactic Acid, Venous 1.7 0.5 - 1.9 mmol/L  CBC with Differential/Platelet     Status: Abnormal   Collection Time: 01/12/17 10:06 AM  Result Value Ref Range   WBC 18.7 (H) 4.0 - 10.5 K/uL   RBC 2.94 (L) 3.87 - 5.11 MIL/uL   Hemoglobin 8.8 (L) 12.0 - 15.0 g/dL   HCT 27.0 (L) 36.0 - 46.0 %   MCV 91.8 78.0 - 100.0 fL   MCH 29.9 26.0 - 34.0 pg   MCHC 32.6 30.0 - 36.0 g/dL   RDW 15.2 11.5 - 15.5 %   Platelets 382 150 - 400 K/uL   Neutrophils Relative % 78 %   Neutro Abs 14.6 (H) 1.7 - 7.7 K/uL   Lymphocytes Relative 13 %   Lymphs Abs 2.3 0.7 - 4.0 K/uL   Monocytes Relative 9 %   Monocytes Absolute 1.6 (H) 0.1 - 1.0 K/uL   Eosinophils Relative 0 %   Eosinophils Absolute 0.1 0.0 - 0.7 K/uL   Basophils Relative 0 %   Basophils Absolute 0.0 0.0 - 0.1  K/uL  Basic metabolic panel     Status: Abnormal   Collection Time: 01/12/17 10:06 AM  Result Value Ref Range   Sodium 131 (L) 135 -  145 mmol/L   Potassium 3.2 (L) 3.5 - 5.1 mmol/L   Chloride 98 (L) 101 - 111 mmol/L   CO2 23 22 - 32 mmol/L   Glucose, Bld 148 (H) 65 - 99 mg/dL   BUN 27 (H) 6 - 20 mg/dL   Creatinine, Ser 1.83 (H) 0.44 - 1.00 mg/dL   Calcium 8.4 (L) 8.9 - 10.3 mg/dL   GFR calc non Af Amer 27 (L) >60 mL/min   GFR calc Af Amer 31 (L) >60 mL/min    Comment: (NOTE) The eGFR has been calculated using the CKD EPI equation. This calculation has not been validated in all clinical situations. eGFR's persistently <60 mL/min signify possible Chronic Kidney Disease.    Anion gap 10 5 - 15  Magnesium     Status: None   Collection Time: 01/12/17 10:06 AM  Result Value Ref Range   Magnesium 1.8 1.7 - 2.4 mg/dL  Reticulocytes     Status: Abnormal   Collection Time: 01/12/17 10:06 AM  Result Value Ref Range   Retic Ct Pct 5.5 (H) 0.4 - 3.1 %   RBC. 2.94 (L) 3.87 - 5.11 MIL/uL   Retic Count, Absolute 161.7 19.0 - 186.0 K/uL   Dg Chest 2 View  Result Date: 01/12/2017 CLINICAL DATA:  72 y/o female with c/o low blood pressure; since recent change in meds Wednesday. No other complaints. Hx COPD, bronchitis, pna, former smoker. EXAM: CHEST  2 VIEW COMPARISON:  01/06/2017 FINDINGS: Lower cervical spine fixation. Midline trachea. Normal heart size. Atherosclerosis in the transverse aorta. No pleural effusion or pneumothorax. Diffuse peribronchial thickening. No lobar consolidation. IMPRESSION: No acute cardiopulmonary disease. Peribronchial thickening which may relate to chronic bronchitis or smoking. Aortic Atherosclerosis (ICD10-I70.0). Electronically Signed   By: Abigail Miyamoto M.D.   On: 01/12/2017 09:36    Pending Labs Unresulted Labs (From admission, onward)   Start     Ordered   01/13/17 0500  Magnesium  Tomorrow morning,   R     01/12/17 1258   01/13/17 1856  Basic metabolic panel  Tomorrow morning,   R     01/12/17 1258   01/13/17 0500  CBC  Tomorrow morning,   R     01/12/17 1258   01/12/17 1013  Folate   Once,   STAT     01/12/17 1013   01/12/17 0859  Vitamin B12  (Anemia Panel (PNL))  Once,   R     01/12/17 0858   01/12/17 0859  Iron and TIBC  (Anemia Panel (PNL))  Once,   R     01/12/17 0858   01/12/17 0859  Ferritin  (Anemia Panel (PNL))  Once,   R     01/12/17 0858   01/12/17 0859  Culture, Urine  Once,   R     01/12/17 0858      Vitals/Pain Today's Vitals   01/12/17 1330 01/12/17 1345 01/12/17 1354 01/12/17 1400  BP:   (!) 116/42 (!) 115/48  Pulse: 84 86 85 86  Resp: (!) 25 (!) 21 (!) 22 (!) 22  Temp:      TempSrc:      SpO2: 96% 95% 96% 95%  Weight:      Height:      PainSc:        Isolation Precautions  No active isolations  Medications Medications  levothyroxine (SYNTHROID, LEVOTHROID) tablet 75 mcg (75 mcg Oral Given 01/12/17 1318)  cephALEXin (KEFLEX) capsule 250 mg (250 mg Oral Given 01/12/17 1317)  oxymetazoline (AFRIN) 0.05 % nasal spray 1 spray (not administered)  acetaminophen (TYLENOL) tablet 650 mg (not administered)    Or  acetaminophen (TYLENOL) suppository 650 mg (not administered)  ondansetron (ZOFRAN) tablet 4 mg (not administered)    Or  ondansetron (ZOFRAN) injection 4 mg (not administered)  0.9 %  sodium chloride infusion ( Intravenous New Bag/Given 01/12/17 0601)  potassium chloride SA (K-DUR,KLOR-CON) CR tablet 40 mEq (40 mEq Oral Given 01/12/17 1317)  magnesium sulfate IVPB 4 g 100 mL (not administered)  calcium gluconate 1 g in sodium chloride 0.9 % 100 mL IVPB (0 g Intravenous Stopped 01/11/17 2204)  lactated ringers bolus 1,000 mL (0 mLs Intravenous Stopped 01/12/17 0101)  sodium chloride 0.9 % bolus 1,000 mL (0 mLs Intravenous Stopped 01/12/17 0101)  potassium chloride SA (K-DUR,KLOR-CON) CR tablet 40 mEq (40 mEq Oral Given 01/12/17 0106)  calcium gluconate 1 g in sodium chloride 0.9 % 100 mL IVPB (0 g Intravenous Stopped 01/12/17 0319)    Mobility walks

## 2017-01-12 NOTE — ED Notes (Signed)
Patient transported to X-ray 

## 2017-01-12 NOTE — ED Notes (Signed)
Urinalysis clicked off in error.

## 2017-01-12 NOTE — ED Notes (Signed)
Spoke to M.d and will be  need to reevaluating pt status for bed placement.

## 2017-01-12 NOTE — Progress Notes (Signed)
Per pt request - took milk compounds out of her allergy list. Pt says she's allergic to Milk & Ice Cream but not cheese. Patient says she eats cheese at home.  Carmela Hurt, RN

## 2017-01-12 NOTE — ED Notes (Signed)
Please call report to Apolonio Schneiders [phone] @ 1442pm

## 2017-01-12 NOTE — H&P (Addendum)
History and Physical    Teresa Jensen EBR:[id number] DOB: Feb 16, 1945 DOA: 01/11/2017  PCP: Pleas Koch, NP  Patient coming from: Home  I have personally briefly reviewed patient's old medical records in [place]  Chief Complaint: dizziness, weakness  HPI: Teresa Jensen is a 72 y.o. female with medical history significant of COPD, Epistaxis admission from 1/8 to 1/10.  Discharged on 1/10 felt to have new diagnosis of HTN.  Started on nifedipine and lisinopril at time of discharge.  Patient took nifedipine and lisinopril this morning (1/11) began to feel dizzy.  Has had persistent dizziness and nausea.  Presents to the ED.   ED Course: Hypotensive on arrival with BP 73/40.  Given 1amp calcium gluconate and 2L NS with improvement in BP to 95/50.  Currently 89/40.  Creat 2.2 up from 0.6 just a couple days ago.   Review of Systems: As per HPI otherwise 10 point review of systems negative.   Past Medical History:  Diagnosis Date  . Anxiety   . COPD (chronic obstructive pulmonary disease) ([place])   . Epistaxis 01/09/2017  . GERD (gastroesophageal reflux disease)   . Hypertension   . New onset of headaches   . Osteopenia   . PMR (polymyalgia rheumatica) ([facility])   . Strain of chest wall 01/20/2011  . Tobacco abuse     Past Surgical History:  Procedure Laterality Date  . ABDOMINAL HYSTERECTOMY    . APPENDECTOMY    . BACK SURGERY    . CARPAL TUNNEL RELEASE Left   . CERVICAL FUSION    . CHOLECYSTECTOMY    . HEMORRHOID SURGERY    . LUMBAR FUSION    . TUBAL LIGATION       reports that she quit smoking about 7 years ago. Her smoking use included cigarettes. She has a 60.00 pack-year smoking history. she has never used smokeless tobacco. She reports that she does not drink alcohol or use drugs.  Allergies  Allergen Reactions  . Prednisone Anaphylaxis, Hives, Shortness Of Breath, Itching and Swelling    The face swells  . Milk-Related Compounds   . Prevnar 13  [Pneumococcal 13-Val Conj Vacc] Other (See Comments)    Reaction not recalled by the patient  . Food Rash    MANGO = cause rashes around the mouth    Family History  Problem Relation Age of Onset  . Heart disease Mother   . Cancer Father        lung/chest wall   . Prostate cancer Father   . Diabetes Brother        siblings  . Hypertension Sister        x 2  . Heart disease Brother   . Breast cancer Paternal Grandmother   . Heart disease Sister   . Diabetes Sister   . Tuberculosis Sister   . Cirrhosis Brother      Prior to Admission medications   Medication Sig Start Date End Date Taking? Authorizing Provider  cephALEXin (KEFLEX) 250 MG capsule Take 1 capsule (250 mg total) by mouth every 6 (six) hours for 5 days. 01/10/17 01/15/17 Yes Bonnell Public, MD  levothyroxine (SYNTHROID, LEVOTHROID) 75 MCG tablet Take 1 tablet by mouth every morning on empty stomach with a full glass of water. 12/05/16  Yes Pleas Koch, NP  oxymetazoline (AFRIN) 0.05 % nasal spray Place 1 spray into both nostrils 2 (two) times daily as needed for congestion (nose bleed). 01/10/17  Yes Bonnell Public,  MD    Physical Exam: Vitals:   01/11/17 2200 01/11/17 2230 01/11/17 2310 01/11/17 2311  BP: (!) 92/41 (!) 112/39 (!) 95/50 (!) 95/50  Pulse: 61 66 79 76  Resp: 14 15 18 20   Temp:      TempSrc:      SpO2: 98% 98%  99%    Constitutional: NAD, calm, comfortable Eyes: PERRL, lids and conjunctivae normal ENMT: Mucous membranes are moist. Posterior pharynx clear of any exudate or lesions.Normal dentition.  Neck: normal, supple, no masses, no thyromegaly Respiratory: clear to auscultation bilaterally, no wheezing, no crackles. Normal respiratory effort. No accessory muscle use.  Cardiovascular: Regular rate and rhythm, no murmurs / rubs / gallops. No extremity edema. 2+ pedal pulses. No carotid bruits.  Abdomen: no tenderness, no masses palpated. No hepatosplenomegaly. Bowel sounds  positive.  Musculoskeletal: no clubbing / cyanosis. No joint deformity upper and lower extremities. Good ROM, no contractures. Normal muscle tone.  Skin: no rashes, lesions, ulcers. No induration Neurologic: CN 2-12 grossly intact. Sensation intact, DTR normal. Strength 5/5 in all 4.  Psychiatric: Normal judgment and insight. Alert and oriented x 3. Normal mood.    Labs on Admission: I have personally reviewed following labs and imaging studies  CBC: Recent Labs  Lab 01/06/17 1713 01/06/17 2015  01/07/17 0517  01/09/17 0333 01/09/17 1005 01/09/17 1716 01/10/17 0629 01/11/17 2055  WBC 15.6* 12.2*   < > 13.6*  --  11.6* 14.9*  --  17.4* 26.1*  NEUTROABS 11.9* 10.0*  --   --   --   --   --   --   --   --   HGB 13.7 12.0   < > 11.0*   < > 9.8* 10.7* 10.5* 10.8* 9.8*  HCT 43.8 38.3   < > 36.6   < > 32.4* 34.2* 33.1* 34.0* 30.2*  MCV 93.6 94.3   < > 95.1  --  92.6 92.2  --  91.4 91.8  PLT 306 272   < > 305  --  285 299  --  335 426*   < > = values in this interval not displayed.   Basic Metabolic Panel: Recent Labs  Lab 01/06/17 2100 01/07/17 0517 01/08/17 1627 01/09/17 0333 01/11/17 2055  NA 138 139 141 137 131*  K 4.0 4.3 4.5 3.7 2.9*  CL 101 108 101 100* 90*  CO2 28 24  --  29 27  GLUCOSE 190* 119* 131* 111* 152*  BUN 15 22* 9 8 27*  CREATININE 0.69 0.52 0.60 0.56 2.25*  CALCIUM 8.9 7.9*  --  8.7* 8.8*   GFR: Estimated Creatinine Clearance: 20.2 mL/min (A) (by C-G formula based on SCr of 2.25 mg/dL (H)). Liver Function Tests: No results for input(s): AST, ALT, ALKPHOS, BILITOT, PROT, ALBUMIN in the last 168 hours. No results for input(s): LIPASE, AMYLASE in the last 168 hours. No results for input(s): AMMONIA in the last 168 hours. Coagulation Profile: Recent Labs  Lab 01/07/17 0105  INR 1.16   Cardiac Enzymes: No results for input(s): CKTOTAL, CKMB, CKMBINDEX, TROPONINI in the last 168 hours. BNP (last 3 results) No results for input(s): PROBNP in the last  8760 hours. HbA1C: No results for input(s): HGBA1C in the last 72 hours. CBG: Recent Labs  Lab 01/11/17 2130  GLUCAP 151*   Lipid Profile: No results for input(s): CHOL, HDL, LDLCALC, TRIG, CHOLHDL, LDLDIRECT in the last 72 hours. Thyroid Function Tests: No results for input(s): TSH, T4TOTAL, FREET4, T3FREE, THYROIDAB  in the last 72 hours. Anemia Panel: No results for input(s): VITAMINB12, FOLATE, FERRITIN, TIBC, IRON, RETICCTPCT in the last 72 hours. Urine analysis:    Component Value Date/Time   COLORURINE YELLOW 03/28/2008 2304   APPEARANCEUR CLEAR 03/28/2008 2304   LABSPEC 1.020 03/28/2008 2304   PHURINE 5.5 03/28/2008 2304   GLUCOSEU NEGATIVE 03/28/2008 2304   HGBUR NEGATIVE 03/28/2008 2304   BILIRUBINUR neg 12/30/2012 0900   KETONESUR NEGATIVE 03/28/2008 2304   PROTEINUR neg 12/30/2012 0900   PROTEINUR NEGATIVE 03/28/2008 2304   UROBILINOGEN 0.2 12/30/2012 0900   UROBILINOGEN 0.2 03/28/2008 2304   NITRITE neg 12/30/2012 0900   NITRITE NEGATIVE 03/28/2008 2304   LEUKOCYTESUR Negative 12/30/2012 0900    Radiological Exams on Admission: No results found.  EKG: Independently reviewed.  Assessment/Plan Principal Problem:   Iatrogenic hypotension Active Problems:   Hypokalemia   Acute kidney failure ([place])    1. Iatrogenic hypotension - causing AKF 1. Some response to calcium even before IVF given 2. Will give a second round of calcium gluconate IV now 3. Will bolus 3rd L IVF now 4. DCd nifedipine and lisinopril and told patient not to take these again. 2. 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 Plan = get BP up! 2. Strict intake and output 3. Repeat BMP in AM 3. Hypokalemia - 40 meq KCL PO  DVT prophylaxis: SCDs only - just had massive epistaxis admit a couple of days ago Code Status: Full Family Communication: No family in room Disposition Plan: Home after admit Consults called: None Admission status: Admit to inpatient - inpatient status for  hypotension, AKF with creat almost 4 times baseline.   Etta Quill DO [facility] Pager [phone]  If 7AM-7PM, please contact day team taking care of patient [email or web address] Password [email or web address]  01/12/2017, 12:12 AM

## 2017-01-12 NOTE — Progress Notes (Signed)
I have seen and assessed patient and agree with Dr. Juleen China assessment and plan. Patient is 71yo female recently admitted for epistaxis and discharged on January 10, 2017 with a new diagnosis of hypertension on nifedipine and lisinopril.  Patient presented after taking her antihypertensive medications with dizziness and nausea and noted to be hypotensive on arrival to the ED with systolic blood pressures in the 70s.  Patient being aggressively hydrated.  Patient also noted to be in acute renal failure.  Patient currently on IV fluids.  Monitor for now.  No charge.

## 2017-01-13 DIAGNOSIS — E538 Deficiency of other specified B group vitamins: Secondary | ICD-10-CM

## 2017-01-13 DIAGNOSIS — I959 Hypotension, unspecified: Secondary | ICD-10-CM

## 2017-01-13 LAB — CBC
HCT: 27 % — ABNORMAL LOW (ref 36.0–46.0)
Hemoglobin: 8.6 g/dL — ABNORMAL LOW (ref 12.0–15.0)
MCH: 29.7 pg (ref 26.0–34.0)
MCHC: 31.9 g/dL (ref 30.0–36.0)
MCV: 93.1 fL (ref 78.0–100.0)
Platelets: 381 10*3/uL (ref 150–400)
RBC: 2.9 MIL/uL — ABNORMAL LOW (ref 3.87–5.11)
RDW: 15.3 % (ref 11.5–15.5)
WBC: 12.3 10*3/uL — ABNORMAL HIGH (ref 4.0–10.5)

## 2017-01-13 LAB — MAGNESIUM: Magnesium: 2.3 mg/dL (ref 1.7–2.4)

## 2017-01-13 LAB — BASIC METABOLIC PANEL
Anion gap: 6 (ref 5–15)
BUN: 21 mg/dL — ABNORMAL HIGH (ref 6–20)
CO2: 25 mmol/L (ref 22–32)
Calcium: 8.3 mg/dL — ABNORMAL LOW (ref 8.9–10.3)
Chloride: 109 mmol/L (ref 101–111)
Creatinine, Ser: 0.93 mg/dL (ref 0.44–1.00)
GFR calc Af Amer: >60 mL/min (ref >60)
GFR calc non Af Amer: >60 mL/min (ref >60)
Glucose, Bld: 97 mg/dL (ref 65–99)
Potassium: 4.1 mmol/L (ref 3.5–5.1)
Sodium: 140 mmol/L (ref 135–145)

## 2017-01-13 MED ORDER — CYANOCOBALAMIN 1000 MCG/ML IJ SOLN
1000.0000 ug | Freq: Every day | INTRAMUSCULAR | Status: DC
  Administered 2017-01-13 – 2017-01-14 (×2): 1000 ug via INTRAMUSCULAR
  Filled 2017-01-13 (×2): qty 1

## 2017-01-13 MED ORDER — FAMOTIDINE 20 MG PO TABS
20.0000 mg | ORAL_TABLET | Freq: Once | ORAL | Status: AC
  Administered 2017-01-14: 20 mg via ORAL
  Filled 2017-01-13: qty 1

## 2017-01-13 NOTE — Progress Notes (Signed)
PROGRESS NOTE    Teresa Jensen  SWN:[id number] DOB: November 19, 1945 DOA: 01/11/2017 PCP: Pleas Koch, NP    Brief Narrative:   Teresa Jensen is a 72 y.o. female with medical history significant of COPD, Epistaxis admission from 1/8 to 1/10.  Discharged on 1/10 felt to have new diagnosis of HTN.  Started on nifedipine and lisinopril at time of discharge.  Patient took nifedipine and lisinopril this morning (1/11) began to feel dizzy.  Has had persistent dizziness and nausea.  Presented to the ED.   ED Course: Hypotensive on arrival with BP 73/40.  Given 1amp calcium gluconate and 2L NS with improvement in BP to 95/50.  Currently 89/40.  Creat 2.2 up from 0.6 just a couple days ago.     Assessment & Plan:   Principal Problem:   Iatrogenic hypotension Active Problems:   Acute kidney failure (HCC)   Hypothyroidism   GERD   COPD (chronic obstructive pulmonary disease) (HCC)   Hypokalemia   Anemia   Hyponatremia   Hypotension   Low vitamin B12 level  1 iatrogenic hypotension Likely secondary to recently started antihypertensive medications.  Patient on admission to have systolic blood pressures in the 70s.  In terms of infection, no signs or symptoms.  Essentially negative for any acute infiltrate.  Patient with no overt bleeding.  EKG with no ischemic changes noted.  Patient blood pressure responding to IV fluids.  Saline lock IV fluids.  Follow.  2.  Acute renal failure Likely secondary to problem #1.  Renal function improving with hydration.  Saline lock IV fluids.  Follow.  3.  COPD Stable.  Continue nebs.  4.  Low vitamin B12 levels Vitamin B12 1000 MCG's IM daily times 7 days and then weekly times 1 month and then monthly.  Outpatient follow-up.  5.  Anemia Likely secondary to recent epistaxis.  H&H stable.  Follow.  6.  Hypokalemia Repleted.  7.  Leukocytosis Likely reactive leukocytosis.  Patient with no signs or symptoms of infection.  WBC trending  down.   DVT prophylaxis: SCDs Code Status: Full Family Communication: Updated patient.  No family at bedside. Disposition Plan: Likely home in the next 1-2 days.   Consultants:   None  Procedures:   Chest x-ray 01/12/2017    Antimicrobials:   Keflex 01/12/2017>>>>>> 01/15/2017   Subjective: Patient sitting up in chair.  Denies any chest pain or shortness of breath.  Feels much better since admission.  Good urine output.  No significant epistaxis noted.  Objective: Vitals:   01/12/17 1620 01/12/17 2034 01/13/17 0616 01/13/17 1318  BP: (!) 118/42 (!) 119/42 (!) 127/43 112/63  Pulse: 89 93 92 (!) 102  Resp: 20 20 18 18   Temp: 98.4 F (36.9 C) 98.8 F (37.1 C) 98.7 F (37.1 C) 98.4 F (36.9 C)  TempSrc: Oral Oral Oral Oral  SpO2: 94% 95% 94% 98%  Weight:      Height: 4\' 9"  (1.448 m)       Intake/Output Summary (Last 24 hours) at 01/13/2017 1716 Last data filed at 01/13/2017 1308 Gross per 24 hour  Intake 3050.42 ml  Output 1950 ml  Net 1100.42 ml   Filed Weights   01/12/17 0119  Weight: 78.9 kg (174 lb)    Examination:  General exam: Appears calm and comfortable  Respiratory system: Clear to auscultation. Respiratory effort normal. Cardiovascular system: S1 & S2 heard, RRR. No JVD, murmurs, rubs, gallops or clicks. No pedal edema. Gastrointestinal system: Abdomen is  nondistended, soft and nontender. No organomegaly or masses felt. Normal bowel sounds heard. Central nervous system: Alert and oriented. No focal neurological deficits. Extremities: Symmetric 5 x 5 power. Skin: No rashes, lesions or ulcers Psychiatry: Judgement and insight appear normal. Mood & affect appropriate.     Data Reviewed: I have personally reviewed following labs and imaging studies  CBC: Recent Labs  Lab 01/06/17 2015  01/09/17 1005 01/09/17 1716 01/10/17 0629 01/11/17 2055 01/12/17 1006 01/13/17 0512  WBC 12.2*   < > 14.9*  --  17.4* 26.1* 18.7* 12.3*  NEUTROABS 10.0*   --   --   --   --   --  14.6*  --   HGB 12.0   < > 10.7* 10.5* 10.8* 9.8* 8.8* 8.6*  HCT 38.3   < > 34.2* 33.1* 34.0* 30.2* 27.0* 27.0*  MCV 94.3   < > 92.2  --  91.4 91.8 91.8 93.1  PLT 272   < > 299  --  335 426* 382 381   < > = values in this interval not displayed.   Basic Metabolic Panel: Recent Labs  Lab 01/07/17 0517 01/08/17 1627 01/09/17 0333 01/11/17 2055 01/12/17 1006 01/13/17 0512  NA 139 141 137 131* 131* 140  K 4.3 4.5 3.7 2.9* 3.2* 4.1  CL 108 101 100* 90* 98* 109  CO2 24  --  29 27 23 25   GLUCOSE 119* 131* 111* 152* 148* 97  BUN 22* 9 8 27* 27* 21*  CREATININE 0.52 0.60 0.56 2.25* 1.83* 0.93  CALCIUM 7.9*  --  8.7* 8.8* 8.4* 8.3*  MG  --   --   --   --  1.8 2.3   GFR: Estimated Creatinine Clearance: 47.9 mL/min (by C-G formula based on SCr of 0.93 mg/dL). Liver Function Tests: No results for input(s): AST, ALT, ALKPHOS, BILITOT, PROT, ALBUMIN in the last 168 hours. No results for input(s): LIPASE, AMYLASE in the last 168 hours. No results for input(s): AMMONIA in the last 168 hours. Coagulation Profile: Recent Labs  Lab 01/07/17 0105  INR 1.16   Cardiac Enzymes: No results for input(s): CKTOTAL, CKMB, CKMBINDEX, TROPONINI in the last 168 hours. BNP (last 3 results) No results for input(s): PROBNP in the last 8760 hours. HbA1C: No results for input(s): HGBA1C in the last 72 hours. CBG: Recent Labs  Lab 01/11/17 2130  GLUCAP 151*   Lipid Profile: No results for input(s): CHOL, HDL, LDLCALC, TRIG, CHOLHDL, LDLDIRECT in the last 72 hours. Thyroid Function Tests: No results for input(s): TSH, T4TOTAL, FREET4, T3FREE, THYROIDAB in the last 72 hours. Anemia Panel: Recent Labs    01/12/17 1006 01/12/17 1013  VITAMINB12  --  320  FOLATE  --  9.2  FERRITIN  --  85  TIBC  --  288  IRON  --  24*  RETICCTPCT 5.5*  --    Sepsis Labs: Recent Labs  Lab 01/11/17 2107 01/12/17 0532 01/12/17 0743  LATICACIDVEN 2.43* 2.0* 1.7    No results found  for this or any previous visit (from the past 240 hour(s)).       Radiology Studies: Dg Chest 2 View  Result Date: 01/12/2017 CLINICAL DATA:  72 y/o female with c/o low blood pressure; since recent change in meds Wednesday. No other complaints. Hx COPD, bronchitis, pna, former smoker. EXAM: CHEST  2 VIEW COMPARISON:  01/06/2017 FINDINGS: Lower cervical spine fixation. Midline trachea. Normal heart size. Atherosclerosis in the transverse aorta. No pleural effusion or pneumothorax.  Diffuse peribronchial thickening. No lobar consolidation. IMPRESSION: No acute cardiopulmonary disease. Peribronchial thickening which may relate to chronic bronchitis or smoking. Aortic Atherosclerosis (ICD10-I70.0). Electronically Signed   By: Abigail Miyamoto M.D.   On: 01/12/2017 09:36        Scheduled Meds: . cephALEXin  250 mg Oral Q6H  . cyanocobalamin  1,000 mcg Intramuscular Daily  . levothyroxine  75 mcg Oral QAC breakfast   Continuous Infusions:   LOS: 1 day    Time spent: 35 mins    Irine Seal, MD [facility] Pager [phone]  If 7PM-7AM, please contact night-coverage [email or web address] Password [facility] 01/13/2017, 5:16 PM

## 2017-01-13 NOTE — Plan of Care (Signed)
  Progressing Health Behavior/Discharge Planning: Ability to manage health-related needs will improve 01/13/2017 2213 - Progressing by Talbert Forest, RN Clinical Measurements: Ability to maintain clinical measurements within normal limits will improve 01/13/2017 2213 - Progressing by Talbert Forest, RN Will remain free from infection 01/13/2017 2213 - Progressing by Talbert Forest, RN Diagnostic test results will improve 01/13/2017 2213 - Progressing by Talbert Forest, RN Respiratory complications will improve 01/13/2017 2213 - Progressing by Talbert Forest, RN Cardiovascular complication will be avoided 01/13/2017 2213 - Progressing by Talbert Forest, RN Elimination: Will not experience complications related to bowel motility 01/13/2017 2213 - Progressing by Talbert Forest, RN Will not experience complications related to urinary retention 01/13/2017 2213 - Progressing by Talbert Forest, RN Pain Managment: General experience of comfort will improve 01/13/2017 2213 - Progressing by Talbert Forest, RN Skin Integrity: Risk for impaired skin integrity will decrease 01/13/2017 2213 - Progressing by Talbert Forest, RN

## 2017-01-13 NOTE — Progress Notes (Signed)
Occupational Therapy Evaluation Patient Details Name: Teresa Jensen MRN: [id number] DOB: 1945-10-20 Today's Date: 01/13/2017    History of Present Illness 72 yo female admitted with iatrogenic hypotension, dizziness, weakness. Hx of COPD   Clinical Impression   Patient independent with BADLs. No OT needs identified. Will sign off.    Follow Up Recommendations  No OT follow up    Equipment Recommendations  None recommended by OT    Recommendations for Other Services       Precautions / Restrictions Precautions Precautions: Fall Restrictions Weight Bearing Restrictions: No      Mobility Bed Mobility              General bed mobility comments: NT - up in recliner upon arrival  Transfers Overall transfer level: Independent   Transfers: Sit to/from Stand Sit to Stand: Independent              Balance                                         ADL either performed or assessed with clinical judgement   ADL Overall ADL's : At baseline                                       General ADL Comments: Patient able to feed and groom sitting in recliner, able to get up to bathroom and toilet without physical assistance; able to reach all body parts.     Vision         Perception     Praxis      Pertinent Vitals/Pain Pain Assessment: No/denies pain     Hand Dominance     Extremity/Trunk Assessment Upper Extremity Assessment Upper Extremity Assessment: Overall WFL for tasks assessed   Lower Extremity Assessment Lower Extremity Assessment: Defer to PT evaluation   Cervical / Trunk Assessment Cervical / Trunk Assessment: Normal   Communication Communication Communication: No difficulties   Cognition Arousal/Alertness: Awake/alert Behavior During Therapy: WFL for tasks assessed/performed Overall Cognitive Status: Within Functional Limits for tasks assessed                                      General Comments       Exercises     Shoulder Instructions      Home Living Family/patient expects to be discharged to:: Private residence Living Arrangements: Spouse/significant other Available Help at Discharge: Family Type of Home: House Home Access: Level entry     Home Layout: Multi-level;Able to live on main level with bedroom/bathroom     Bathroom Shower/Tub: Occupational psychologist: Standard     Home Equipment: None          Prior Functioning/Environment Level of Independence: Independent                 OT Problem List:        OT Treatment/Interventions:      OT Goals(Current goals can be found in the care plan section) Acute Rehab OT Goals Patient Stated Goal: to get better OT Goal Formulation: All assessment and education complete, DC therapy  OT Frequency:     Barriers to D/C:  Co-evaluation              AM-PAC PT "6 Clicks" Daily Activity     Outcome Measure Help from another person eating meals?: None Help from another person taking care of personal grooming?: None Help from another person toileting, which includes using toliet, bedpan, or urinal?: None Help from another person bathing (including washing, rinsing, drying)?: None Help from another person to put on and taking off regular upper body clothing?: None Help from another person to put on and taking off regular lower body clothing?: None 6 Click Score: 24   End of Session    Activity Tolerance: Patient tolerated treatment well Patient left: in chair;with call bell/phone within reach  OT Visit Diagnosis: Unsteadiness on feet (R26.81)                Time: 1258-1310 OT Time Calculation (min): 12 min Charges:  OT General Charges $OT Visit: 1 Visit OT Evaluation $OT Eval Low Complexity: 1 Low G-Codes:       Chamar Broughton A Owin Vignola 2017/01/24, 1:37 PM

## 2017-01-13 NOTE — Evaluation (Signed)
Physical Therapy Evaluation Patient Details Name: Teresa Jensen MRN: [id number] DOB: 1945/10/14 Today's Date: 01/13/2017   History of Present Illness  72 yo female admitted with iatrogenic hypotension, dizziness, weakness. Hx of COPD  Clinical Impression  On eval, pt was Min guard assist for mobility. She walked ~165 feet in the hallway. Pt began to fatigue and needed to use hallway handrail. O2 sat 92% on RA after ambulation. Pt c/o mild lightheadedness during session. Do not anticipate any f/u PT needs. Recommend daily ambulation in hallway with nursing supervision. Will follow and progress activity as tolerated.     Follow Up Recommendations No PT follow up;Supervision - Intermittent    Equipment Recommendations  None recommended by PT    Recommendations for Other Services       Precautions / Restrictions Precautions Precautions: Fall Restrictions Weight Bearing Restrictions: No      Mobility  Bed Mobility Overal bed mobility: Independent                Transfers Overall transfer level: Needs assistance   Transfers: Sit to/from Stand Sit to Stand: Independent            Ambulation/Gait Ambulation/Gait assistance: Min guard Ambulation Distance (Feet): 165 Feet Assistive device: None(hallway handrail after ~75 feet) Gait Pattern/deviations: Step-through pattern;Decreased stride length;Drifts right/left     General Gait Details: close guard for safety. Pt began to fatigue after ~75 feet. Mildly unsteady. O2 sat 92% on RA after ambulation.   Stairs            Wheelchair Mobility    Modified Rankin (Stroke Patients Only)       Balance Overall balance assessment: Needs assistance           Standing balance-Leahy Scale: Fair                               Pertinent Vitals/Pain Pain Assessment: No/denies pain    Home Living Family/patient expects to be discharged to:: Private residence Living Arrangements:  Spouse/significant other Available Help at Discharge: Family Type of Home: House Home Access: Level entry     Home Layout: Multi-level;Able to live on main level with bedroom/bathroom Home Equipment: None      Prior Function Level of Independence: Independent               Hand Dominance        Extremity/Trunk Assessment   Upper Extremity Assessment Upper Extremity Assessment: Defer to OT evaluation    Lower Extremity Assessment Lower Extremity Assessment: Generalized weakness    Cervical / Trunk Assessment Cervical / Trunk Assessment: Normal  Communication   Communication: No difficulties  Cognition Arousal/Alertness: Awake/alert Behavior During Therapy: WFL for tasks assessed/performed Overall Cognitive Status: Within Functional Limits for tasks assessed                                        General Comments      Exercises     Assessment/Plan    PT Assessment Patient needs continued PT services  PT Problem List Decreased balance;Decreased mobility;Decreased activity tolerance;Decreased strength       PT Treatment Interventions Gait training;Functional mobility training;Therapeutic activities;Balance training;Patient/family education;Therapeutic exercise    PT Goals (Current goals can be found in the Care Plan section)  Acute Rehab PT Goals Patient Stated Goal: to get better  PT Goal Formulation: With patient Time For Goal Achievement: 01/27/17 Potential to Achieve Goals: Good    Frequency Min 3X/week   Barriers to discharge        Co-evaluation               AM-PAC PT "6 Clicks" Daily Activity  Outcome Measure Difficulty turning over in bed (including adjusting bedclothes, sheets and blankets)?: None Difficulty moving from lying on back to sitting on the side of the bed? : None Difficulty sitting down on and standing up from a chair with arms (e.g., wheelchair, bedside commode, etc,.)?: None Help needed moving to  and from a bed to chair (including a wheelchair)?: A Little Help needed walking in hospital room?: A Little Help needed climbing 3-5 steps with a railing? : A Little 6 Click Score: 21    End of Session Equipment Utilized During Treatment: Gait belt Activity Tolerance: Patient limited by fatigue Patient left: in chair;with call bell/phone within reach   PT Visit Diagnosis: Difficulty in walking, not elsewhere classified (R26.2);Muscle weakness (generalized) (M62.81)    Time: 1010-1023 PT Time Calculation (min) (ACUTE ONLY): 13 min   Charges:   PT Evaluation $PT Eval Moderate Complexity: 1 Mod     PT G Codes:          Weston Anna, MPT Pager: [phone]

## 2017-01-14 ENCOUNTER — Ambulatory Visit: Payer: Medicare Other | Admitting: Primary Care

## 2017-01-14 ENCOUNTER — Telehealth: Payer: Self-pay

## 2017-01-14 DIAGNOSIS — D649 Anemia, unspecified: Secondary | ICD-10-CM

## 2017-01-14 LAB — BASIC METABOLIC PANEL
Anion gap: 6 (ref 5–15)
BUN: 17 mg/dL (ref 6–20)
CO2: 27 mmol/L (ref 22–32)
Calcium: 8.4 mg/dL — ABNORMAL LOW (ref 8.9–10.3)
Chloride: 105 mmol/L (ref 101–111)
Creatinine, Ser: 0.82 mg/dL (ref 0.44–1.00)
GFR calc Af Amer: >60 mL/min (ref >60)
GFR calc non Af Amer: >60 mL/min (ref >60)
Glucose, Bld: 97 mg/dL (ref 65–99)
Potassium: 4.1 mmol/L (ref 3.5–5.1)
Sodium: 138 mmol/L (ref 135–145)

## 2017-01-14 LAB — CBC
HCT: 27.4 % — ABNORMAL LOW (ref 36.0–46.0)
Hemoglobin: 8.6 g/dL — ABNORMAL LOW (ref 12.0–15.0)
MCH: 29.7 pg (ref 26.0–34.0)
MCHC: 31.4 g/dL (ref 30.0–36.0)
MCV: 94.5 fL (ref 78.0–100.0)
Platelets: 388 10*3/uL (ref 150–400)
RBC: 2.9 MIL/uL — ABNORMAL LOW (ref 3.87–5.11)
RDW: 15.3 % (ref 11.5–15.5)
WBC: 10.4 10*3/uL (ref 4.0–10.5)

## 2017-01-14 LAB — URINE CULTURE: Culture: NO GROWTH

## 2017-01-14 MED ORDER — VITAMIN B-12 1000 MCG PO TABS: 1000.0000 ug | ORAL_TABLET | Freq: Every day | ORAL | 0 refills | Status: DC

## 2017-01-14 NOTE — Telephone Encounter (Signed)
Noted  

## 2017-01-14 NOTE — Telephone Encounter (Signed)
PLEASE NOTE: All timestamps contained within this report are represented as [place] Standard Time. CONFIDENTIALTY NOTICE: This fax transmission is intended only for the addressee. It contains information that is legally privileged, confidential or otherwise protected from use or disclosure. If you are not the intended recipient, you are strictly prohibited from reviewing, disclosing, copying using or disseminating any of this information or taking any action in reliance on or regarding this information. If you have received this fax in error, please notify us immediately by telephone so that we can arrange for its return to [place]. Phone: [phone], Toll-Free: [phone], Fax: [phone] [place]: 1 of 2 Call Id: [id number] [place] Patient Name: Teresa Jensen Gender: Unknown DOB: 1945/08/08 Age: 72 Y 9 M 23 D Return Phone Number: [phone] (Primary) Address: City/State/Zip: [place] [address] Client [place] Night - Client Client Site [place] Physician Alma Friendly - NP Contact Type Call Who Is Calling Patient / Member / Family / Caregiver Call Type Triage / Clinical Caller Name Harrell Gave Vandiver Relationship To Patient Son Return Phone Number [phone] (Primary) Chief Complaint BLOOD PRESSURE LOW - Systolic (top number) 90 or less with dizzy or weak symptoms Reason for Call Symptomatic / Request for Health Information Initial Comment Caller states he is calling about his mother. She is on several medication and she is experiencing dizziness, nausea and light headed. Her most recent BP was 107/43 Translation No Nurse Assessment Nurse: Ardine Bjork, RN, Melissa Date/Time (Eastern Time): 01/11/2017 6:20:31 PM Confirm and document reason for call. If symptomatic, describe symptoms. ---Caller states he is calling about his mother.  She is on several medication and she is experiencing dizziness, nausea and light headed. Her most recent BP was 107/43. Unknown normal BP. Sxs began this am after taking new medication rx at hosp-seen in ED yest via ambulance for oral and nasal bleeding-right nostril packed in ED and started on HTN meds-disch Mon pm. Tue began to bleed-to ED again and released yesterday pm-ENT cauterized vessel right nose while in hosp. Does the patient have any new or worsening symptoms? ---Yes Will a triage be completed? ---Yes Related visit to physician within the last 2 weeks? ---Yes Does the PT have any chronic conditions? (i.e. diabetes, asthma, etc.) ---Yes List chronic conditions. ---HTN. COPD. Is this a behavioral health or substance abuse call? ---No Guidelines Guideline Title Affirmed Question Affirmed Notes Nurse Date/Time (Eastern Time) Low Blood Pressure [4] Fall in systolic BP > 20 mm Hg from normal AND [2] dizzy, lightheaded, or weak Zayas, RN, Melissa 01/11/2017 6:25:35 PM PLEASE NOTE: All timestamps contained within this report are represented as [place] Standard Time. CONFIDENTIALTY NOTICE: This fax transmission is intended only for the addressee. It contains information that is legally privileged, confidential or otherwise protected from use or disclosure. If you are not the intended recipient, you are strictly prohibited from reviewing, disclosing, copying using or disseminating any of this information or taking any action in reliance on or regarding this information. If you have received this fax in error, please notify us immediately by telephone so that we can arrange for its return to [place]. Phone: [phone], Toll-Free: [phone], Fax: [phone] [place]: 2 of 2 Call Id: [id number] [place]. Time Eilene Ghazi Time) Disposition Final User 01/11/2017 6:14:36 PM Send to Urgent Vivia Budge 01/11/2017 6:32:00 PM Go to ED Now (or PCP triage) Yes Zayas, RN, Emelia Salisbury  Disagree/Comply Comply  Caller Understands Yes PreDisposition InappropriateToAsk Care Advice Given Per Guideline GO TO ED NOW (OR PCP TRIAGE): * IF NO PCP TRIAGE: You need to be seen. Go to the [facility] at _[facility] Hospital within the next hour. Leave as soon as you can. DRIVING: Another adult should drive. CARE ADVICE given per Low Blood Pressure (Adult) guideline. Referrals GO TO FACILITY UNDECIDED

## 2017-01-14 NOTE — Discharge Summary (Signed)
Physician Discharge Summary  Teresa Jensen ZLD:[id number] DOB: 03-30-1945 DOA: 01/11/2017  PCP: Teresa Koch, NP  Admit date: 01/11/2017 Discharge date: 01/14/2017  Time spent: 60 minutes  Recommendations for Outpatient Follow-up:  1. Follow-up with Teresa Koch, NP in 1-2 weeks.  On follow-up patient's blood pressure needs to be reassessed.  Patient will need a basic metabolic profile done to follow-up on electrolytes and renal function.  Patient will need a H&H to follow-up on hemoglobin.  Patient will need low vitamin B12 levels to be followed up upon.   Discharge Diagnoses:  Principal Problem:   Iatrogenic hypotension Active Problems:   Acute kidney failure (HCC)   Hypothyroidism   GERD   COPD (chronic obstructive pulmonary disease) (HCC)   Hypokalemia   Anemia   Hyponatremia   Hypotension   Low vitamin B12 level   Discharge Condition: Stable and improved  Diet recommendation: regular  Filed Weights   01/12/17 0119  Weight: 78.9 kg (174 lb)    History of present illness:  Per Dr Teresa Jensen is a 72 y.o. female with medical history significant of COPD, Epistaxis admission from 1/8 to 1/10.  Discharged on 1/10 felt to have new diagnosis of HTN.  Started on nifedipine and lisinopril at time of discharge.  Patient took nifedipine and lisinopril the morning of admission (1/11) began to feel dizzy.  Has had persistent dizziness and nausea.  Presented to the ED.   ED Course: Hypotensive on arrival with BP 73/40.  Given 1amp calcium gluconate and 2L NS with improvement in BP to 95/50.  Currently 89/40.  Creat 2.2 up from 0.6 just a couple days ago.    Hospital Course:  1 iatrogenic hypotension Likely secondary to recently started antihypertensive medications.  Patient on admission to have systolic blood pressures in the 70s.  In terms of infection, no signs or symptoms.  Essentially negative for any acute infiltrate.  Patient with no overt  bleeding.  EKG with no ischemic changes noted.  Patient was placed on IV fluids and hydrated.  Blood pressure responded to fluid resuscitation and hypotension had resolved by day of discharge.  Patient be discharged off any antihypertensive medications and is to follow-up with PCP in the outpatient setting.   2.  Acute renal failure Likely secondary to problem #1.    Patient's antihypertensive medications were held.  Patient was hydrated with IV fluids.  Renal function improved on a daily basis such that acute renal failure had resolved by day of discharge.  3.  COPD Stable.    Patient placed on nebs.  4.  Low vitamin B12 levels Noted to have low vitamin B12 levels of 320.  Patient was placed on vitamin B12 1000 MCG's IM daily injections times 2 days during the hospitalization.  Patient will be discharged home on oral vitamin B12 1000 MCG's daily with outpatient follow-up with PCP.  5.  Anemia Likely secondary to recent epistaxis.  H&H remained stable.    6.  Hypokalemia Repleted.  7.  Leukocytosis Likely reactive leukocytosis.  Patient with no signs or symptoms of infection.  WBC trended down and leukocytosis had resolved by day of discharge.  Patient with recent epistaxis and had a Rhino Rocket placed and had been placed on oral Keflex which was continued during this hospitalization.  Outpatient follow-up.  8 epistaxis Patient had recently been hospitalized for epistaxis.  Patient was placed on Afrin as needed.  Patient also was placed on oral antibiotics which was  continued during his hospitalization.  Patient did not have any further episodes of epistaxis is to follow-up in the outpatient setting.    Procedures:  Chest x-ray 01/12/2017      Consultations:  None  Discharge Exam: Vitals:   01/13/17 2202 01/14/17 0609  BP: (!) 156/61 (!) 130/55  Pulse: 100 85  Resp:  20  Temp: 99.5 F (37.5 C) 98.3 F (36.8 C)  SpO2: 94% 92%    General: NAD Cardiovascular:  RRR Respiratory: CTAB  Discharge Instructions   Discharge Instructions    Diet general   Complete by:  As directed    Increase activity slowly   Complete by:  As directed      Allergies as of 01/14/2017      Reactions   Prednisone Anaphylaxis, Hives, Shortness Of Breath, Itching, Swelling   The face swells   Prevnar 13 [pneumococcal 13-val Conj Vacc] Other (See Comments)   Reaction not recalled by the patient   Food Rash   MANGO = cause rashes around the mouth      Medication List    TAKE these medications   cephALEXin 250 MG capsule Commonly known as:  KEFLEX Take 1 capsule (250 mg total) by mouth every 6 (six) hours for 5 days.   levothyroxine 75 MCG tablet Commonly known as:  SYNTHROID, LEVOTHROID Take 1 tablet by mouth every morning on empty stomach with a full glass of water.   oxymetazoline 0.05 % nasal spray Commonly known as:  AFRIN Place 1 spray into both nostrils 2 (two) times daily as needed for congestion (nose bleed).   vitamin B-12 1000 MCG tablet Commonly known as:  CYANOCOBALAMIN Take 1 tablet (1,000 mcg total) by mouth daily.      Allergies  Allergen Reactions  . Prednisone Anaphylaxis, Hives, Shortness Of Breath, Itching and Swelling    The face swells  . Prevnar 13 [Pneumococcal 13-Val Conj Vacc] Other (See Comments)    Reaction not recalled by the patient  . Food Rash    MANGO = cause rashes around the mouth   Follow-up Information    Teresa Koch, NP. Schedule an appointment as soon as possible for a visit in 1 week(s).   Specialty:  Internal Medicine Why:  f/u in 1-2 weeks. Contact information: [place] [phone]            The results of significant diagnostics from this hospitalization (including imaging, microbiology, ancillary and laboratory) are listed below for reference.    Significant Diagnostic Studies: Dg Chest 2 View  Result Date: 01/12/2017 CLINICAL DATA:  72 y/o female  with c/o low blood pressure; since recent change in meds Wednesday. No other complaints. Hx COPD, bronchitis, pna, former smoker. EXAM: CHEST  2 VIEW COMPARISON:  01/06/2017 FINDINGS: Lower cervical spine fixation. Midline trachea. Normal heart size. Atherosclerosis in the transverse aorta. No pleural effusion or pneumothorax. Diffuse peribronchial thickening. No lobar consolidation. IMPRESSION: No acute cardiopulmonary disease. Peribronchial thickening which may relate to chronic bronchitis or smoking. Aortic Atherosclerosis (ICD10-I70.0). Electronically Signed   By: Abigail Miyamoto M.D.   On: 01/12/2017 09:36   Dg Chest Portable 1 View  Result Date: 01/06/2017 CLINICAL DATA:  Hypoxia. EXAM: PORTABLE CHEST 1 VIEW COMPARISON:  Radiograph 10/03/2016 FINDINGS: Unchanged heart size and mediastinal contours with aortic atherosclerosis. Chronic hyperinflation with increased bronchial thickening from prior exam. No pulmonary edema, focal airspace disease or pneumothorax. Postsurgical change in the lower cervical spine. IMPRESSION: Increased bronchial  thickening.  Chronic hyperinflation. Electronically Signed   By: Jeb Levering M.D.   On: 01/06/2017 22:42    Microbiology: Recent Results (from the past 240 hour(s))  Culture, Urine     Status: None   Collection Time: 01/12/17  3:30 AM  Result Value Ref Range Status   Specimen Description URINE, RANDOM  Final   Special Requests NONE  Final   Culture   Final    NO GROWTH Performed at [facility], [address], [place],  [address]    Report Status 01/14/2017 FINAL  Final     Labs: Basic Metabolic Panel: Recent Labs  Lab 01/09/17 0333 01/11/17 2055 01/12/17 1006 01/13/17 0512 01/14/17 0456  NA 137 131* 131* 140 138  K 3.7 2.9* 3.2* 4.1 4.1  CL 100* 90* 98* 109 105  CO2 29 27 23 25 27   GLUCOSE 111* 152* 148* 97 97  BUN 8 27* 27* 21* 17  CREATININE 0.56 2.25* 1.83* 0.93 0.82  CALCIUM 8.7* 8.8* 8.4* 8.3* 8.4*  MG  --   --  1.8  2.3  --    Liver Function Tests: No results for input(s): AST, ALT, ALKPHOS, BILITOT, PROT, ALBUMIN in the last 168 hours. No results for input(s): LIPASE, AMYLASE in the last 168 hours. No results for input(s): AMMONIA in the last 168 hours. CBC: Recent Labs  Lab 01/10/17 0629 01/11/17 2055 01/12/17 1006 01/13/17 0512 01/14/17 0456  WBC 17.4* 26.1* 18.7* 12.3* 10.4  NEUTROABS  --   --  14.6*  --   --   HGB 10.8* 9.8* 8.8* 8.6* 8.6*  HCT 34.0* 30.2* 27.0* 27.0* 27.4*  MCV 91.4 91.8 91.8 93.1 94.5  PLT 335 426* 382 381 388   Cardiac Enzymes: No results for input(s): CKTOTAL, CKMB, CKMBINDEX, TROPONINI in the last 168 hours. BNP: BNP (last 3 results) No results for input(s): BNP in the last 8760 hours.  ProBNP (last 3 results) No results for input(s): PROBNP in the last 8760 hours.  CBG: Recent Labs  Lab 01/11/17 2130  GLUCAP 151*       Signed:  Irine Seal MD.  [facility] 01/14/2017, 11:56 AM

## 2017-01-14 NOTE — Telephone Encounter (Signed)
Per chart review tab pt was admitted to [facility] on 01/11/17.

## 2017-01-16 ENCOUNTER — Ambulatory Visit (INDEPENDENT_AMBULATORY_CARE_PROVIDER_SITE_OTHER): Payer: Medicare Other | Admitting: Primary Care

## 2017-01-16 ENCOUNTER — Encounter: Payer: Self-pay | Admitting: Primary Care

## 2017-01-16 DIAGNOSIS — E538 Deficiency of other specified B group vitamins: Secondary | ICD-10-CM | POA: Diagnosis not present

## 2017-01-16 DIAGNOSIS — I959 Hypotension, unspecified: Secondary | ICD-10-CM

## 2017-01-16 DIAGNOSIS — R040 Epistaxis: Secondary | ICD-10-CM

## 2017-01-16 DIAGNOSIS — D649 Anemia, unspecified: Secondary | ICD-10-CM | POA: Diagnosis not present

## 2017-01-16 NOTE — Assessment & Plan Note (Addendum)
Admitted to the hospital twice for epistaxis. No bleeding since discharge, compliant to saline nasal spray to prevent dryness. She will follow-up with ENT next week as recommended.  Recheck CBC and BMP in 1 week. Strict return precautions provided today.  Strongly recommended she continue to monitor her blood pressure and report readings at or above 135/90.  We will hold off on any hypertensive treatment at this time.  All hospital notes, labs, imaging reviewed.

## 2017-01-16 NOTE — Assessment & Plan Note (Signed)
Secondary to initiation of 2 blood pressure medications during second hospital visit.  No prior history of hypertension.  Blood pressure stable in the office today.  We will have her continue to monitor her blood pressure and report readings at or above 135/90.

## 2017-01-16 NOTE — Progress Notes (Signed)
Subjective:    Patient ID: Teresa Jensen, female    DOB: [birth year], 72 y.o.   MRN: [id number]  HPI  Teresa Jensen is a 72 year old female who presents today for hospital follow up and TCM follow up.  She originally presented to [facility] on 01/06/17 with complaints of epistaxis and elevated blood pressure. She underwent treatment with Afrin nasal spray and packing in the ED and was still noted to have large quantity of bleeding so she was admitted for further management. ENT was consulted and evaluated. Her bleeding subsided after continuous packing. Her blood pressure improved with PRN IV hydralazine. She was discharged home on 01/07/17 with instructions for close ENT follow up. She was not discharged home with medication for BP control.  She presented to [facility] on 01/08/17 with complaints of epistaxis with bleeding down her throat and left nares which was causing difficulty breathing. She was treated with Afrin nasal spray, hydralazine, and admitted for recurrent epistaxis. Labs were overall stable and alike to those drawn during her last admission. During her hospital stay she was consulted by ENT who noted hypertensive right posterior middle meatus arteriolar epistaxis. She was treated with cautery given that she'd failed packing with balloon pack. She was treated with cephalexin antibiotics. She was discharged home on 01/10/17 with a prescription for HCTZ and nifedipine for BP control. She was told to monitor her BP with close PCP follow up.   She presented to [facility] on 01/11/17 with complaints of dizziness, weakness. Her dizziness was present with moving from sitting to standing position. She reported her dizziness began after starting her lisinopril and nifedipine. She was noted to be hypotensive on arrival to the emergency department with BP of 73/40.  During her ED stay she underwent treatment including ECG (NSR, LVH, prolonged QT inerval), labs (hypokalemia, hyponatremia, leukocytosis, increased  creatinine). She was treated with calcium gluconate, IV fluids with improvement in BP to 95/50. She was admitted for further management of BP.  During her hospital stay she was treated with IV calcium, IV fluids. ECG showed no ischemic changes. Her blood pressure responded to treatment. Her renal function improved with hydration. Her vitamin B 12 was noted to be low so she was initiated on IM 1000 mcg with a plan for daily for 7 days, then once weekly for one month, then monthly.    She was discharged on 01/14/17 with recommendations for repeat BMP, CBC, and B12. Her antihypertensive medications were discontinued.    Since her most recent discharge she's checking her BP at home and is getting readings of 120's/70-80's. She denies nose bleeds since her last hospital stay. She will see her ENT next week for follow up. She's feeling fatigued. She was actually sent home with Vitamin B12 1000 mcg tablets once daily. She's using saline nasal spray daily.   BP Readings from Last 3 Encounters:  01/16/17 122/78  01/14/17 (!) 130/55  01/10/17 (!) 141/63      Review of Systems  Constitutional: Positive for fatigue.  HENT: Negative for nosebleeds.   Respiratory: Negative for shortness of breath.   Cardiovascular: Negative for chest pain and palpitations.  Neurological: Negative for dizziness, weakness and headaches.       Past Medical History:  Diagnosis Date  . Anxiety   . COPD (chronic obstructive pulmonary disease) ([place])   . Epistaxis 01/09/2017  . GERD (gastroesophageal reflux disease)   . Hypertension   . New onset of headaches   . Osteopenia   .  PMR (polymyalgia rheumatica) (HCC)   . Strain of chest wall 01/20/2011  . Tobacco abuse      Social History   Socioeconomic History  . Marital status: Married    Spouse name: Not on file  . Number of children: 1  . Years of education: Not on file  . Highest education level: Not on file  Social Needs  . Financial resource strain: Not  on file  . Food insecurity - worry: Not on file  . Food insecurity - inability: Not on file  . Transportation needs - medical: Not on file  . Transportation needs - non-medical: Not on file  Occupational History  . Occupation: Therapist, art as a Therapist, art: RETIRED    Comment: retired  Tobacco Use  . Smoking status: Former Smoker    Packs/day: 1.50    Years: 40.00    Pack years: 60.00    Types: Cigarettes    Last attempt to quit: 02/11/2009    Years since quitting: 7.9  . Smokeless tobacco: Never Used  Substance and Sexual Activity  . Alcohol use: No    Alcohol/week: 0.0 oz  . Drug use: No  . Sexual activity: No  Other Topics Concern  . Not on file  Social History Narrative   Lives in [place] with her husband.  Takes care of her two grandchildren, ages 73 and 2, everyday in [place].  Has one son who is 54.   Desires CPR   Would not want prolonged life support if futile    Past Surgical History:  Procedure Laterality Date  . ABDOMINAL HYSTERECTOMY    . APPENDECTOMY    . BACK SURGERY    . CARPAL TUNNEL RELEASE Left   . CERVICAL FUSION    . CHOLECYSTECTOMY    . HEMORRHOID SURGERY    . LUMBAR FUSION    . TUBAL LIGATION      Family History  Problem Relation Age of Onset  . Heart disease Mother   . Cancer Father        lung/chest wall   . Prostate cancer Father   . Diabetes Brother        siblings  . Hypertension Sister        x 2  . Heart disease Brother   . Breast cancer Paternal Grandmother   . Heart disease Sister   . Diabetes Sister   . Tuberculosis Sister   . Cirrhosis Brother     Allergies  Allergen Reactions  . Prednisone Anaphylaxis, Hives, Shortness Of Breath, Itching and Swelling    The face swells  . Prevnar 13 [Pneumococcal 13-Val Conj Vacc] Other (See Comments)    Reaction not recalled by the patient  . Food Rash    MANGO = cause rashes around the mouth    Current Outpatient Medications on File Prior to Visit    Medication Sig Dispense Refill  . levothyroxine (SYNTHROID, LEVOTHROID) 75 MCG tablet Take 1 tablet by mouth every morning on empty stomach with a full glass of water. 90 tablet 3  . oxymetazoline (AFRIN) 0.05 % nasal spray Place 1 spray into both nostrils 2 (two) times daily as needed for congestion (nose bleed). 30 mL 0  . vitamin B-12 (CYANOCOBALAMIN) 1000 MCG tablet Take 1 tablet (1,000 mcg total) by mouth daily. 30 tablet 0   No current facility-administered medications on file prior to visit.     BP 122/78   Pulse (!) 102   Temp 98.4  F (36.9 C) (Oral)   Ht 4\' 11"  (1.499 m)   Wt 178 lb 6.4 oz (80.9 kg)   SpO2 94%   BMI 36.03 kg/m    Objective:   Physical Exam  Constitutional: She appears well-nourished.  HENT:  Right Ear: Tympanic membrane and ear canal normal.  Left Ear: Tympanic membrane and ear canal normal.  Nose: No mucosal edema. No epistaxis.  Mouth/Throat: Oropharynx is clear and moist.  Eyes: Conjunctivae are normal.  Neck: Neck supple.  Cardiovascular: Normal rate and regular rhythm.  Pulmonary/Chest: Effort normal and breath sounds normal. She has no wheezes. She has no rales.  Lymphadenopathy:    She has no cervical adenopathy.  Skin: Skin is warm and dry.          Assessment & Plan:

## 2017-01-16 NOTE — Assessment & Plan Note (Signed)
Level in the low 300s from recent hospital stay.  We will have her continue 1000 mcg orally daily.  Recheck B12 in 4-6 weeks.

## 2017-01-16 NOTE — Assessment & Plan Note (Signed)
Repeat CBC in 1 week.

## 2017-01-16 NOTE — Patient Instructions (Addendum)
Continue taking Vitamin B 12 1000 mcg once daily.   Continue the saline nasal spray as directed.  Follow up with ENT as scheduled.   Schedule a lab only appointment in one week for repeat labs.  Continue to monitor your blood pressure as discussed. Please send me your readings in 2 weeks. Please call me sooner if you notice your blood pressure at or above 135/90 on a consistent basis.  It was a pleasure to see you today!

## 2017-01-17 ENCOUNTER — Other Ambulatory Visit: Payer: Self-pay | Admitting: Primary Care

## 2017-01-17 DIAGNOSIS — D649 Anemia, unspecified: Secondary | ICD-10-CM

## 2017-01-17 DIAGNOSIS — N179 Acute kidney failure, unspecified: Secondary | ICD-10-CM

## 2017-01-22 ENCOUNTER — Encounter: Payer: Self-pay | Admitting: Primary Care

## 2017-01-22 DIAGNOSIS — I10 Essential (primary) hypertension: Secondary | ICD-10-CM

## 2017-01-23 ENCOUNTER — Other Ambulatory Visit: Payer: Medicare Other

## 2017-01-23 ENCOUNTER — Encounter: Payer: Self-pay | Admitting: Primary Care

## 2017-01-23 MED ORDER — LISINOPRIL 5 MG PO TABS: 5.0000 mg | ORAL_TABLET | Freq: Every day | ORAL | 0 refills | Status: DC

## 2017-01-24 ENCOUNTER — Encounter: Payer: Self-pay | Admitting: Primary Care

## 2017-01-25 ENCOUNTER — Other Ambulatory Visit (INDEPENDENT_AMBULATORY_CARE_PROVIDER_SITE_OTHER): Payer: Medicare Other

## 2017-01-25 DIAGNOSIS — N179 Acute kidney failure, unspecified: Secondary | ICD-10-CM

## 2017-01-25 DIAGNOSIS — D649 Anemia, unspecified: Secondary | ICD-10-CM

## 2017-01-25 LAB — BASIC METABOLIC PANEL
BUN: 11 mg/dL (ref 6–23)
CO2: 27 mEq/L (ref 19–32)
Calcium: 8.7 mg/dL (ref 8.4–10.5)
Chloride: 99 mEq/L (ref 96–112)
Creatinine, Ser: 0.62 mg/dL (ref 0.40–1.20)
GFR: 100.61 mL/min (ref >60.00)
Glucose, Bld: 132 mg/dL — ABNORMAL HIGH (ref 70–99)
Potassium: 3.7 mEq/L (ref 3.5–5.1)
Sodium: 137 mEq/L (ref 135–145)

## 2017-01-25 LAB — CBC
HCT: 31.4 % — ABNORMAL LOW (ref 36.0–46.0)
Hemoglobin: 10.2 g/dL — ABNORMAL LOW (ref 12.0–15.0)
MCHC: 32.6 g/dL (ref 30.0–36.0)
MCV: 88.5 fl (ref 78.0–100.0)
Platelets: 527 10*3/uL — ABNORMAL HIGH (ref 150.0–400.0)
RBC: 3.55 Mil/uL — ABNORMAL LOW (ref 3.87–5.11)
RDW: 15 % (ref 11.5–15.5)
WBC: 11.1 10*3/uL — ABNORMAL HIGH (ref 4.0–10.5)

## 2017-01-29 ENCOUNTER — Encounter: Payer: Self-pay | Admitting: Primary Care

## 2017-02-27 ENCOUNTER — Ambulatory Visit: Payer: Self-pay | Admitting: *Deleted

## 2017-02-27 NOTE — Telephone Encounter (Signed)
Pt c/o swelling in both ankles after being on her feet all day. Denies fever, pain, redness, no on blood thinnersor chest pain. Walking without difficulty. No protocol found. Appointment made for Monday per pt request. Home care advice given to patient with verbal understanding. Pt states she will go to emergency department for any cardiac symptoms.  Answer Assessment - Initial Assessment Questions 1. LOCATION: "Which joint is swollen?"     Both ankles 2. ONSET: "When did the swelling start?"     Only swollen yesterday after being on her feet most of the day 3. SIZE: "How large is the swelling?"     Just a little bigger than normal 4. PAIN: "Is there any pain?" If so, ask: "How bad is it?" (Scale 1-10; or mild, moderate, severe)     no 5. CAUSE: "What do you think caused the swollen joint?"     Being up on feet 6. OTHER SYMPTOMS: "Do you have any other symptoms?" (e.g., fever, chest pain, difficulty breathing, calf pain)     no 7. PREGNANCY: "Is there any chance you are pregnant?" "When was your last menstrual period?"     no  Protocols used: ANKLE SWELLING-A-AH

## 2017-03-01 ENCOUNTER — Encounter: Payer: Self-pay | Admitting: Primary Care

## 2017-03-04 ENCOUNTER — Encounter: Payer: Self-pay | Admitting: Primary Care

## 2017-03-04 ENCOUNTER — Ambulatory Visit (INDEPENDENT_AMBULATORY_CARE_PROVIDER_SITE_OTHER): Payer: Medicare Other | Admitting: Primary Care

## 2017-03-04 DIAGNOSIS — I10 Essential (primary) hypertension: Secondary | ICD-10-CM

## 2017-03-04 NOTE — Patient Instructions (Signed)
Start taking your lisinopril daily as prescribed.  Please notify me if your blood pressure runs at or above 135/90 on a consistent basis.   It was a pleasure to see you today!

## 2017-03-04 NOTE — Progress Notes (Signed)
Subjective:    Patient ID: Teresa Jensen, female    DOB: 1945-09-29, 72 y.o.   MRN: [id number]  HPI  Teresa Jensen is a 72 year old female with a history of hypertension, hypothyroidism, osteoarthritis, obesity who presents today with a chief complaint of ankle edema.   Her edema occurred to her bilateral ankles 6 days ago, she was up on her feet more than usual that day including shopping and doing household chores. Her edema decreased that afternoon after elevating her legs.   She has a history of hypertension and is currently managed on lisinopril 5 mg. She forgets to take her lisinopril often and will take it 3 times weekly on average. She plans on getting a pill box so she can remember to take her medication. She's been checking her blood pressure at home which is running 130-140's/60's. She's under a lot of stress with family issues and will be flying to [place] tomorrow.   BP Readings from Last 3 Encounters:  03/04/17 (!) 148/90  01/16/17 122/78  01/14/17 (!) 130/55    She denies dizziness, chest pain, calf pain/swelling, ankle edema since last week.  Review of Systems  Constitutional: Negative for fatigue.  Respiratory: Negative for shortness of breath.   Cardiovascular: Negative for chest pain.       Ankle edema  Neurological: Negative for dizziness and headaches.       Past Medical History:  Diagnosis Date  . Anxiety   . COPD (chronic obstructive pulmonary disease) ([place])   . Epistaxis 01/09/2017  . GERD (gastroesophageal reflux disease)   . Hypertension   . New onset of headaches   . Osteopenia   . PMR (polymyalgia rheumatica) (HCC)   . Strain of chest wall 01/20/2011  . Tobacco abuse      Social History   Socioeconomic History  . Marital status: Married    Spouse name: Not on file  . Number of children: 1  . Years of education: Not on file  . Highest education level: Not on file  Social Needs  . Financial resource strain: Not on file  . Food insecurity -  worry: Not on file  . Food insecurity - inability: Not on file  . Transportation needs - medical: Not on file  . Transportation needs - non-medical: Not on file  Occupational History  . Occupation: Therapist, art as a Therapist, art: RETIRED    Comment: retired  Tobacco Use  . Smoking status: Former Smoker    Packs/day: 1.50    Years: 40.00    Pack years: 60.00    Types: Cigarettes    Last attempt to quit: 02/11/2009    Years since quitting: 8.0  . Smokeless tobacco: Never Used  Substance and Sexual Activity  . Alcohol use: No    Alcohol/week: 0.0 oz  . Drug use: No  . Sexual activity: No  Other Topics Concern  . Not on file  Social History Narrative   Lives in [place] with her husband.  Takes care of her two grandchildren, ages 61 and 2, everyday in [place].  Has one son who is 74.   Desires CPR   Would not want prolonged life support if futile    Past Surgical History:  Procedure Laterality Date  . ABDOMINAL HYSTERECTOMY    . APPENDECTOMY    . BACK SURGERY    . CARPAL TUNNEL RELEASE Left   . CERVICAL FUSION    . CHOLECYSTECTOMY    .  HEMORRHOID SURGERY    . LUMBAR FUSION    . TUBAL LIGATION      Family History  Problem Relation Age of Onset  . Heart disease Mother   . Cancer Father        lung/chest wall   . Prostate cancer Father   . Diabetes Brother        siblings  . Hypertension Sister        x 2  . Heart disease Brother   . Breast cancer Paternal Grandmother   . Heart disease Sister   . Diabetes Sister   . Tuberculosis Sister   . Cirrhosis Brother     Allergies  Allergen Reactions  . Prednisone Anaphylaxis, Hives, Shortness Of Breath, Itching and Swelling    The face swells  . Prevnar 13 [Pneumococcal 13-Val Conj Vacc] Other (See Comments)    Reaction not recalled by the patient  . Food Rash    MANGO = cause rashes around the mouth    Current Outpatient Medications on File Prior to Visit  Medication Sig Dispense Refill  .  levothyroxine (SYNTHROID, LEVOTHROID) 75 MCG tablet Take 1 tablet by mouth every morning on empty stomach with a full glass of water. 90 tablet 3  . oxymetazoline (AFRIN) 0.05 % nasal spray Place 1 spray into both nostrils 2 (two) times daily as needed for congestion (nose bleed). 30 mL 0  . vitamin B-12 (CYANOCOBALAMIN) 1000 MCG tablet Take 1 tablet (1,000 mcg total) by mouth daily. 30 tablet 0  . lisinopril (PRINIVIL,ZESTRIL) 5 MG tablet Take 1 tablet (5 mg total) by mouth daily. (Patient not taking: Reported on 03/04/2017) 30 tablet 0   No current facility-administered medications on file prior to visit.     BP (!) 148/90   Pulse 82   Temp 98.2 F (36.8 C) (Oral)   Ht 4\' 11"  (1.499 m)   Wt 180 lb 12 oz (82 kg)   SpO2 95%   BMI 36.51 kg/m    Objective:   Physical Exam  Constitutional: She appears well-nourished.  Neck: Neck supple.  Cardiovascular: Normal rate and regular rhythm.  Pulses:      Dorsalis pedis pulses are 2+ on the right side, and 2+ on the left side.       Posterior tibial pulses are 2+ on the right side, and 2+ on the left side.  No ankle or lower extremity edema noted  Pulmonary/Chest: Effort normal and breath sounds normal.  Skin: Skin is warm and dry.          Assessment & Plan:

## 2017-03-04 NOTE — Assessment & Plan Note (Signed)
Above goal in the office today, not taking BP medication regularly as prescribed. She'll continue monitoring BP and will start taking her lisinopril daily as prescribed. She'll update if readings are at or above 135/90.   No ankle edema noted today. Suspect this was from increased activity that day as her edema reduced with rest and elevation. She'll continue to monitor.

## 2017-03-28 ENCOUNTER — Encounter: Payer: Self-pay | Admitting: Primary Care

## 2017-04-12 ENCOUNTER — Ambulatory Visit (INDEPENDENT_AMBULATORY_CARE_PROVIDER_SITE_OTHER): Payer: Medicare Other | Admitting: Primary Care

## 2017-04-12 ENCOUNTER — Encounter: Payer: Self-pay | Admitting: Primary Care

## 2017-04-12 VITALS — BP 122/80 | HR 63 | Temp 98.0°F | Ht 59.0 in | Wt 181.0 lb

## 2017-04-12 DIAGNOSIS — L539 Erythematous condition, unspecified: Secondary | ICD-10-CM | POA: Diagnosis not present

## 2017-04-12 DIAGNOSIS — R600 Localized edema: Secondary | ICD-10-CM

## 2017-04-12 LAB — CBC WITH DIFFERENTIAL/PLATELET
Basophils Absolute: 0 10*3/uL (ref 0.0–0.1)
Basophils Relative: 0.4 % (ref 0.0–3.0)
Eosinophils Absolute: 0.1 10*3/uL (ref 0.0–0.7)
Eosinophils Relative: 1.3 % (ref 0.0–5.0)
HCT: 39 % (ref 36.0–46.0)
Hemoglobin: 12.5 g/dL (ref 12.0–15.0)
Lymphocytes Relative: 22.4 % (ref 12.0–46.0)
Lymphs Abs: 2.1 10*3/uL (ref 0.7–4.0)
MCHC: 32 g/dL (ref 30.0–36.0)
MCV: 81 fl (ref 78.0–100.0)
Monocytes Absolute: 1 10*3/uL (ref 0.1–1.0)
Monocytes Relative: 9.9 % (ref 3.0–12.0)
Neutro Abs: 6.3 10*3/uL (ref 1.4–7.7)
Neutrophils Relative %: 66 % (ref 43.0–77.0)
Platelets: 359 10*3/uL (ref 150.0–400.0)
RBC: 4.81 Mil/uL (ref 3.87–5.11)
RDW: 18 % — ABNORMAL HIGH (ref 11.5–15.5)
WBC: 9.6 10*3/uL (ref 4.0–10.5)

## 2017-04-12 LAB — BRAIN NATRIURETIC PEPTIDE: Pro B Natriuretic peptide (BNP): 85 pg/mL (ref 0.0–100.0)

## 2017-04-12 NOTE — Assessment & Plan Note (Addendum)
Chronic for years, bilaterally. Doesn't appear to be cellulitis, rash/dermatitis, arterial/vascular cause. She has great pedal pulses, no fever today. Consider ABI's but given normal pedal pulses this will likely be normal. Check BNP given cough at night and mild intermittent edema. Check CBC to rule out infection given fevers. She will send a picture of the cream that was provided years ago that improved color to the legs. Consulted with supervising MD who agrees and has no other suggestions at this time. Await labs.

## 2017-04-12 NOTE — Progress Notes (Signed)
Subjective:    Patient ID: Teresa Jensen, female    DOB: 05/02/1945, 72 y.o.   MRN: [id number]  HPI  Teresa Jensen is a 72 year old female with a history of hypertension, hypothyroidism, obesity who presents today with a chief complaint of lower extremity erythema.   Her erythema is located to the bilateral lower extremities from the mid calf to her ankles. She also reports mild (intermittent) swelling and tenderness. Symptoms have been present constantly for years. She saw dermatology several years ago and was told this was dermatitis. She was provided with a cream at the time with improvement, she cannot recall the name.  She also reports chills that are daily. She's been checking her temperature and noticed a fever of 100.0 Wednesday this week. She took Tylenol with resolve. No recent Tylenol or fevers since Wednesday. She does notice a cough at night when laying down, this has been present for years, she sleeps on two pillows at night as she can't lay flat.  Review of Systems  Constitutional: Positive for chills and fever.  HENT: Negative for congestion, sinus pressure and sore throat.   Respiratory: Positive for cough. Negative for shortness of breath.   Cardiovascular: Positive for leg swelling. Negative for chest pain.  Skin: Positive for color change.       Past Medical History:  Diagnosis Date  . Anxiety   . COPD (chronic obstructive pulmonary disease) ([facility])   . Epistaxis 01/09/2017  . GERD (gastroesophageal reflux disease)   . Hypertension   . New onset of headaches   . Osteopenia   . PMR (polymyalgia rheumatica) ([facility])   . Strain of chest wall 01/20/2011  . Tobacco abuse      Social History   Socioeconomic History  . Marital status: Married    Spouse name: Not on file  . Number of children: 1  . Years of education: Not on file  . Highest education level: Not on file  Occupational History  . 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 Financial resource strain: Not on file  . Food insecurity:    Worry: Not on file    Inability: Not on file  . Transportation needs:    Medical: Not on file    Non-medical: Not on file  Tobacco Use  . Smoking status: Former Smoker    Packs/day: 1.50    Years: 40.00    Pack years: 60.00    Types: Cigarettes    Last attempt to quit: 02/11/2009    Years since quitting: 8.1  . Smokeless tobacco: Never Used  Substance and Sexual Activity  . Alcohol use: No    Alcohol/week: 0.0 oz  . Drug use: No  . Sexual activity: Never  Lifestyle  . Physical activity:    Days per week: Not on file    Minutes per session: Not on file  . Stress: Not on file  Relationships  . Social connections:    Talks on phone: Not on file    Gets together: Not on file    Attends religious service: Not on file    Active member of club or organization: Not on file    Attends meetings of clubs or organizations: Not on file    Relationship status: Not on file  . Intimate partner violence:    Fear of current or ex partner: Not on file    Emotionally abused: Not on  file    Physically abused: Not on file    Forced sexual activity: Not on file  Other Topics Concern  . Not on file  Social History Narrative   Lives in [place] with her husband.  Takes care of her two grandchildren, ages 38 and 2, everyday in [place].  Has one son who is 17.   Desires CPR   Would not want prolonged life support if futile    Past Surgical History:  Procedure Laterality Date  . ABDOMINAL HYSTERECTOMY    . APPENDECTOMY    . BACK SURGERY    . CARPAL TUNNEL RELEASE Left   . CERVICAL FUSION    . CHOLECYSTECTOMY    . HEMORRHOID SURGERY    . LUMBAR FUSION    . TUBAL LIGATION      Family History  Problem Relation Age of Onset  . Heart disease Mother   . Cancer Father        lung/chest wall   . Prostate cancer Father   . Diabetes Brother        siblings  . Hypertension Sister        x  2  . Heart disease Brother   . Breast cancer Paternal Grandmother   . Heart disease Sister   . Diabetes Sister   . Tuberculosis Sister   . Cirrhosis Brother     Allergies  Allergen Reactions  . Prednisone Anaphylaxis, Hives, Shortness Of Breath, Itching and Swelling    The face swells  . Prevnar 13 [Pneumococcal 13-Val Conj Vacc] Other (See Comments)    Reaction not recalled by the patient  . Food Rash    MANGO = cause rashes around the mouth    Current Outpatient Medications on File Prior to Visit  Medication Sig Dispense Refill  . levothyroxine (SYNTHROID, LEVOTHROID) 75 MCG tablet Take 1 tablet by mouth every morning on empty stomach with a full glass of water. 90 tablet 3  . lisinopril (PRINIVIL,ZESTRIL) 5 MG tablet Take 1 tablet (5 mg total) by mouth daily. 30 tablet 0  . oxymetazoline (AFRIN) 0.05 % nasal spray Place 1 spray into both nostrils 2 (two) times daily as needed for congestion (nose bleed). 30 mL 0   No current facility-administered medications on file prior to visit.     BP 122/80   Pulse 63   Temp 98 F (36.7 C) (Oral)   Ht 4\' 11"  (1.499 m)   Wt 181 lb (82.1 kg)   SpO2 98%   BMI 36.56 kg/m    Objective:   Physical Exam  Neck: Neck supple.  Cardiovascular: Normal rate.  Pulses:      Dorsalis pedis pulses are 2+ on the right side, and 2+ on the left side.       Posterior tibial pulses are 2+ on the right side, and 2+ on the left side.  Pulmonary/Chest: Effort normal.  Skin: Skin is warm and dry.  Mild red-brown color to bilateral lower extremities with blanching up on palpation. No abrasions/wounds. Mildly tender. No rash.          Assessment & Plan:

## 2017-04-12 NOTE — Patient Instructions (Signed)
Stop by the lab prior to leaving today. I will notify you of your results once received.   Please send me a picture of the cream you have at home.  It was a pleasure to see you today!

## 2017-04-13 ENCOUNTER — Encounter: Payer: Self-pay | Admitting: Primary Care

## 2017-04-14 ENCOUNTER — Encounter: Payer: Self-pay | Admitting: Primary Care

## 2017-04-14 DIAGNOSIS — L539 Erythematous condition, unspecified: Secondary | ICD-10-CM

## 2017-04-15 ENCOUNTER — Ambulatory Visit (INDEPENDENT_AMBULATORY_CARE_PROVIDER_SITE_OTHER): Payer: Medicare Other | Admitting: Podiatry

## 2017-04-15 ENCOUNTER — Encounter: Payer: Self-pay | Admitting: Primary Care

## 2017-04-15 ENCOUNTER — Ambulatory Visit: Payer: Medicare Other

## 2017-04-15 ENCOUNTER — Encounter: Payer: Self-pay | Admitting: Podiatry

## 2017-04-15 DIAGNOSIS — R52 Pain, unspecified: Secondary | ICD-10-CM

## 2017-04-15 DIAGNOSIS — M79672 Pain in left foot: Secondary | ICD-10-CM | POA: Diagnosis not present

## 2017-04-15 DIAGNOSIS — Q828 Other specified congenital malformations of skin: Secondary | ICD-10-CM | POA: Diagnosis not present

## 2017-04-15 NOTE — Progress Notes (Signed)
Of this patient presents the office with chief complaint of a painful callus under the ball of her left foot. She says this callus is painful walking and wearing her shoes.  She says the area is now painful to the touch. . She now presents the office with severe pain and discomfort at the site of the callus under the ball of the left foot  GENERAL APPEARANCE: Alert, conversant. Appropriately groomed. No acute distress.  VASCULAR: Pedal pulses are  palpable at  [facility] and PT bilateral.  Capillary refill time is immediate to all digits,  Normal temperature gradient.  Digital hair growth is present bilateral  NEUROLOGIC: sensation is normal to 5.07 monofilament at 5/5 sites bilateral.  Light touch is intact bilateral, Muscle strength normal.  MUSCULOSKELETAL: acceptable muscle strength, tone and stability bilateral.  Intrinsic muscluature intact bilateral.  Rectus appearance of foot and digits noted bilateral.   DERMATOLOGIC: skin color, texture, and turgor are within normal limits.  Well circumscribed porokeratosis sub 5 left foot and sub 1 right foot..    No open lesions present.  Digital nails are asymptomatic. No drainage noted.  Porokeratosis left foot.  Debridement of porokeratotic lesion.  Patient was also dispensed and provided with padding to help to relieve pressure at the site. Told patient that she should return to the office in 3 months for continued evaluation and treatment. Discussed possible removal and excision of this lesion for in the future   Gardiner Barefoot DPM

## 2017-05-01 ENCOUNTER — Ambulatory Visit (INDEPENDENT_AMBULATORY_CARE_PROVIDER_SITE_OTHER): Payer: Medicare Other | Admitting: Primary Care

## 2017-05-01 ENCOUNTER — Encounter: Payer: Self-pay | Admitting: Primary Care

## 2017-05-01 VITALS — BP 124/80 | HR 78 | Temp 98.5°F | Ht 59.0 in | Wt 180.5 lb

## 2017-05-01 DIAGNOSIS — M62830 Muscle spasm of back: Secondary | ICD-10-CM | POA: Diagnosis not present

## 2017-05-01 NOTE — Patient Instructions (Signed)
Continue to take Tylenol. Take only 650mg  every 6 hours as need for pain, do not exceed 3000mg /day. You can also alternate with Ibuprofen 400mg  every 6 hours as needed for pain, do not exceed 2,400mg /day.  You can also use a heat pack and do some back exercises to help.   Please follow-up if your pain does not continue to improve or sooner if it gets worse.   It has been a pleasure seeing you today. Teresa Lung, RN, Adult-Geriatric Nurse Practitioner Student and Angelina Ok, AGNP   Back Exercises If you have pain in your back, do these exercises 2-3 times each day or as told by your doctor. When the pain goes away, do the exercises once each day, but repeat the steps more times for each exercise (do more repetitions). If you do not have pain in your back, do these exercises once each day or as told by your doctor. Exercises Single Knee to Chest  Do these steps 3-5 times in a row for each leg: 1. Lie on your back on a firm bed or the floor with your legs stretched out. 2. Bring one knee to your chest. 3. Hold your knee to your chest by grabbing your knee or thigh. 4. Pull on your knee until you feel a gentle stretch in your lower back. 5. Keep doing the stretch for 10-30 seconds. 6. Slowly let go of your leg and straighten it.  Pelvic Tilt  Do these steps 5-10 times in a row: 1. Lie on your back on a firm bed or the floor with your legs stretched out. 2. Bend your knees so they point up to the ceiling. Your feet should be flat on the floor. 3. Tighten your lower belly (abdomen) muscles to press your lower back against the floor. This will make your tailbone point up to the ceiling instead of pointing down to your feet or the floor. 4. Stay in this position for 5-10 seconds while you gently tighten your muscles and breathe evenly.  Cat-Cow  Do these steps until your lower back bends more easily: 1. Get on your hands and knees on a firm surface. Keep your hands under your  shoulders, and keep your knees under your hips. You may put padding under your knees. 2. Let your head hang down, and make your tailbone point down to the floor so your lower back is round like the back of a cat. 3. Stay in this position for 5 seconds. 4. Slowly lift your head and make your tailbone point up to the ceiling so your back hangs low (sags) like the back of a cow. 5. Stay in this position for 5 seconds.  Press-Ups  Do these steps 5-10 times in a row: 1. Lie on your belly (face-down) on the floor. 2. Place your hands near your head, about shoulder-width apart. 3. While you keep your back relaxed and keep your hips on the floor, slowly straighten your arms to raise the top half of your body and lift your shoulders. Do not use your back muscles. To make yourself more comfortable, you may change where you place your hands. 4. Stay in this position for 5 seconds. 5. Slowly return to lying flat on the floor.  Bridges  Do these steps 10 times in a row: 1. Lie on your back on a firm surface. 2. Bend your knees so they point up to the ceiling. Your feet should be flat on the floor. 3. Tighten your butt muscles  and lift your butt off of the floor until your waist is almost as high as your knees. If you do not feel the muscles working in your butt and the back of your thighs, slide your feet 1-2 inches farther away from your butt. 4. Stay in this position for 3-5 seconds. 5. Slowly lower your butt to the floor, and let your butt muscles relax.  If this exercise is too easy, try doing it with your arms crossed over your chest. Belly Crunches  Do these steps 5-10 times in a row: 1. Lie on your back on a firm bed or the floor with your legs stretched out. 2. Bend your knees so they point up to the ceiling. Your feet should be flat on the floor. 3. Cross your arms over your chest. 4. Tip your chin a little bit toward your chest but do not bend your neck. 5. Tighten your belly muscles and  slowly raise your chest just enough to lift your shoulder blades a tiny bit off of the floor. 6. Slowly lower your chest and your head to the floor.  Back Lifts Do these steps 5-10 times in a row: 1. Lie on your belly (face-down) with your arms at your sides, and rest your forehead on the floor. 2. Tighten the muscles in your legs and your butt. 3. Slowly lift your chest off of the floor while you keep your hips on the floor. Keep the back of your head in line with the curve in your back. Look at the floor while you do this. 4. Stay in this position for 3-5 seconds. 5. Slowly lower your chest and your face to the floor.  Contact a doctor if:  Your back pain gets a lot worse when you do an exercise.  Your back pain does not lessen 2 hours after you exercise. If you have any of these problems, stop doing the exercises. Do not do them again unless your doctor says it is okay. Get help right away if:  You have sudden, very bad back pain. If this happens, stop doing the exercises. Do not do them again unless your doctor says it is okay. This information is not intended to replace advice given to you by your health care provider. Make sure you discuss any questions you have with your health care provider. Document Released: 01/20/2010 Document Revised: 05/26/2015 Document Reviewed: 02/11/2014 Elsevier Interactive Patient Education  [employer].

## 2017-05-01 NOTE — Progress Notes (Signed)
Subjective:    Patient ID: Teresa Jensen, female    DOB: 05/12/1945, 72 y.o.   MRN: [id number]  HPI Teresa Jensen is a 72 y.o. female who presents today with a CC of back pain. Left mid thoracic lateral posterior 8/10 sore pain starting 3 days ago after sneezing. Was taking APAP 1250 BID with relief. Today she reports her pain much better at a 5/10 and states it is just sore. Sometimes it wakes her up in middle of night but able to quickly goes back to sleep. Denies any cough, SOB, N/V, or pain radiating to jaw/neck/arms.   Review of Systems  Constitutional: Negative for activity change and diaphoresis.  Respiratory: Negative for chest tightness and wheezing.   Cardiovascular: Negative for palpitations.  Genitourinary: Negative for difficulty urinating, dysuria and hematuria.  Musculoskeletal: Negative for gait problem and neck stiffness.  Neurological: Negative for dizziness, weakness and light-headedness.       Past Medical History:  Diagnosis Date  . Anxiety   . COPD (chronic obstructive pulmonary disease) ([facility])   . Epistaxis 01/09/2017  . GERD (gastroesophageal reflux disease)   . Hypertension   . New onset of headaches   . Osteopenia   . PMR (polymyalgia rheumatica) ([facility])   . Strain of chest wall 01/20/2011  . Tobacco abuse    Past Surgical History:  Procedure Laterality Date  . ABDOMINAL HYSTERECTOMY    . APPENDECTOMY    . BACK SURGERY    . CARPAL TUNNEL RELEASE Left   . CERVICAL FUSION    . CHOLECYSTECTOMY    . HEMORRHOID SURGERY    . LUMBAR FUSION    . TUBAL LIGATION     Social History   Socioeconomic History  . Marital status: Married    Spouse name: Not on file  . Number of children: 1  . Years of education: Not on file  . Highest education level: Not on file  Occupational History  . 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 Financial resource strain: Not on file  . Food insecurity:     Worry: Not on file    Inability: Not on file  . Transportation needs:    Medical: Not on file    Non-medical: Not on file  Tobacco Use  . Smoking status: Former Smoker    Packs/day: 1.50    Years: 40.00    Pack years: 60.00    Types: Cigarettes    Last attempt to quit: 02/11/2009    Years since quitting: 8.2  . Smokeless tobacco: Never Used  Substance and Sexual Activity  . Alcohol use: No    Alcohol/week: 0.0 oz  . Drug use: No  . Sexual activity: Never  Lifestyle  . Physical activity:    Days per week: Not on file    Minutes per session: Not on file  . Stress: Not on file  Relationships  . Social connections:    Talks on phone: Not on file    Gets together: Not on file    Attends religious service: Not on file    Active member of club or organization: Not on file    Attends meetings of clubs or organizations: Not on file    Relationship status: Not on file  . Intimate partner violence:    Fear of current or ex partner: Not on file    Emotionally abused: Not on file  Physically abused: Not on file    Forced sexual activity: Not on file  Other Topics Concern  . Not on file  Social History Narrative   Lives in [place] with her husband.  Takes care of her two grandchildren, ages 82 and 2, everyday in [place].  Has one son who is 93.   Desires CPR   Would not want prolonged life support if futile   Family History  Problem Relation Age of Onset  . Heart disease Mother   . Cancer Father        lung/chest wall   . Prostate cancer Father   . Diabetes Brother        siblings  . Hypertension Sister        x 2  . Heart disease Brother   . Breast cancer Paternal Grandmother   . Heart disease Sister   . Diabetes Sister   . Tuberculosis Sister   . Cirrhosis Brother    Current Outpatient Medications on File Prior to Visit  Medication Sig Dispense Refill  . levothyroxine (SYNTHROID, LEVOTHROID) 75 MCG tablet Take 1 tablet by mouth every morning on empty stomach  with a full glass of water. 90 tablet 3  . lisinopril (PRINIVIL,ZESTRIL) 5 MG tablet Take 1 tablet (5 mg total) by mouth daily. 30 tablet 0  . oxymetazoline (AFRIN) 0.05 % nasal spray Place 1 spray into both nostrils 2 (two) times daily as needed for congestion (nose bleed). 30 mL 0   No current facility-administered medications on file prior to visit.    Objective:   Physical Exam  Constitutional: She is oriented to person, place, and time. She appears well-nourished.  Cardiovascular: Normal rate and regular rhythm. Exam reveals no gallop and no friction rub.  No murmur heard. Pulmonary/Chest: Effort normal and breath sounds normal.  Musculoskeletal: Normal range of motion. She exhibits tenderness.  Neurological: She is alert and oriented to person, place, and time.  Skin: Skin is warm and dry.    BP 124/80   Pulse 78   Temp 98.5 F (36.9 C) (Oral)   Ht 4\' 11"  (1.499 m)   Wt 180 lb 8 oz (81.9 kg)   SpO2 94%   BMI 36.46 kg/m       Assessment & Plan:   1. Muscle spasm of back - APAP 650mg  Q6H PRN alternat with IBU 400mg  Q6H PRN - Heat pack and back exercises  Denita Lung, RN, Adult-Geriatric Nurse Practitioner Student

## 2017-05-01 NOTE — Progress Notes (Signed)
Subjective:    Patient ID: Teresa Jensen, female    DOB: [birth year], 72 y.o.   MRN: [id number]  HPI  Teresa Jensen is a 72 year old female who presents today with a chief complaint of muscle pain.   Her pain is located to mid lateral posterior thoracic region which began three days ago after sneezing. She's been taking Tylenol 1250 BID with improvement. Her pain has improved overall, does occasionally wake her from sleep but is easily able to return to sleep.   She denies injury/trauma, chest pain, shortness of breath, skin changes, numbness/tingling. She's not applying ice/heat or stretching.   Review of Systems  Respiratory: Negative for cough and shortness of breath.   Cardiovascular: Negative for chest pain.  Gastrointestinal: Negative for abdominal pain.  Genitourinary: Negative for dysuria and frequency.  Musculoskeletal: Positive for myalgias.  Skin: Negative for color change.  Neurological: Negative for numbness.       Past Medical History:  Diagnosis Date  . Anxiety   . COPD (chronic obstructive pulmonary disease) ([place])   . Epistaxis 01/09/2017  . GERD (gastroesophageal reflux disease)   . Hypertension   . New onset of headaches   . Osteopenia   . PMR (polymyalgia rheumatica) ([facility])   . Strain of chest wall 01/20/2011  . Tobacco abuse      Social History   Socioeconomic History  . Marital status: Married    Spouse name: Not on file  . Number of children: 1  . Years of education: Not on file  . Highest education level: Not on file  Occupational History  . 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 Financial resource strain: Not on file  . Food insecurity:    Worry: Not on file    Inability: Not on file  . Transportation needs:    Medical: Not on file    Non-medical: Not on file  Tobacco Use  . Smoking status: Former Smoker    Packs/day: 1.50    Years: 40.00    Pack years: 60.00    Types:  Cigarettes    Last attempt to quit: 02/11/2009    Years since quitting: 8.2  . Smokeless tobacco: Never Used  Substance and Sexual Activity  . Alcohol use: No    Alcohol/week: 0.0 oz  . Drug use: No  . Sexual activity: Never  Lifestyle  . Physical activity:    Days per week: Not on file    Minutes per session: Not on file  . Stress: Not on file  Relationships  . Social connections:    Talks on phone: Not on file    Gets together: Not on file    Attends religious service: Not on file    Active member of club or organization: Not on file    Attends meetings of clubs or organizations: Not on file    Relationship status: Not on file  . Intimate partner violence:    Fear of current or ex partner: Not on file    Emotionally abused: Not on file    Physically abused: Not on file    Forced sexual activity: Not on file  Other Topics Concern  . Not on file  Social History Narrative   Lives in [place] with her husband.  Takes care of her two grandchildren, ages 36 and 2, everyday in [place].  Has one son who is 30.  Desires CPR   Would not want prolonged life support if futile    Past Surgical History:  Procedure Laterality Date  . ABDOMINAL HYSTERECTOMY    . APPENDECTOMY    . BACK SURGERY    . CARPAL TUNNEL RELEASE Left   . CERVICAL FUSION    . CHOLECYSTECTOMY    . HEMORRHOID SURGERY    . LUMBAR FUSION    . TUBAL LIGATION      Family History  Problem Relation Age of Onset  . Heart disease Mother   . Cancer Father        lung/chest wall   . Prostate cancer Father   . Diabetes Brother        siblings  . Hypertension Sister        x 2  . Heart disease Brother   . Breast cancer Paternal Grandmother   . Heart disease Sister   . Diabetes Sister   . Tuberculosis Sister   . Cirrhosis Brother     Allergies  Allergen Reactions  . Prednisone Anaphylaxis, Hives, Shortness Of Breath, Itching and Swelling    The face swells  . Prevnar 13 [Pneumococcal 13-Val Conj Vacc]  Other (See Comments)    Reaction not recalled by the patient  . Food Rash    MANGO = cause rashes around the mouth    Current Outpatient Medications on File Prior to Visit  Medication Sig Dispense Refill  . levothyroxine (SYNTHROID, LEVOTHROID) 75 MCG tablet Take 1 tablet by mouth every morning on empty stomach with a full glass of water. 90 tablet 3  . lisinopril (PRINIVIL,ZESTRIL) 5 MG tablet Take 1 tablet (5 mg total) by mouth daily. 30 tablet 0  . oxymetazoline (AFRIN) 0.05 % nasal spray Place 1 spray into both nostrils 2 (two) times daily as needed for congestion (nose bleed). 30 mL 0   No current facility-administered medications on file prior to visit.     BP 124/80   Pulse 78   Temp 98.5 F (36.9 C) (Oral)   Ht 4\' 11"  (1.499 m)   Wt 180 lb 8 oz (81.9 kg)   SpO2 94%   BMI 36.46 kg/m    Objective:   Physical Exam  Cardiovascular: Normal rate.  Pulmonary/Chest: Effort normal.  Musculoskeletal:       Thoracic back: She exhibits tenderness, pain and spasm. She exhibits normal range of motion and no bony tenderness.       Back:  Tenderness upon palpation to region. No masses or spinal tenderness.  Skin: Skin is warm and dry.          Assessment & Plan:  Muscle Spasm:  Present for the last 3 days since sneezing. Overall doing better on Tylenol.  Discussed to continue Tylenol, not to exceed 3000 mg in 24 hours. Also discussed use of heat/ice, and stretching exercises. Follow up PRN.  Pleas Koch, NP

## 2017-05-19 ENCOUNTER — Encounter: Payer: Self-pay | Admitting: Primary Care

## 2017-06-06 ENCOUNTER — Ambulatory Visit (INDEPENDENT_AMBULATORY_CARE_PROVIDER_SITE_OTHER): Payer: Medicare Other | Admitting: Primary Care

## 2017-06-06 ENCOUNTER — Encounter: Payer: Self-pay | Admitting: Primary Care

## 2017-06-06 VITALS — BP 124/82 | HR 73 | Temp 98.1°F | Ht 59.0 in | Wt 183.8 lb

## 2017-06-06 DIAGNOSIS — R229 Localized swelling, mass and lump, unspecified: Secondary | ICD-10-CM

## 2017-06-06 DIAGNOSIS — M546 Pain in thoracic spine: Secondary | ICD-10-CM

## 2017-06-06 DIAGNOSIS — M549 Dorsalgia, unspecified: Secondary | ICD-10-CM | POA: Insufficient documentation

## 2017-06-06 NOTE — Assessment & Plan Note (Signed)
Located to left mid thoracic spine. Do not suspect this to be an arthritic type pain given tenderness and palpable deep mass.   She does have a history of benign cyst to her left shoulder, this very well could be similar. Will order ultrasound of the thoracic back for further evaluation. Chest xray from January unremarkable. Consider CT if needed.

## 2017-06-06 NOTE — Patient Instructions (Signed)
You will be contacted regarding your ultrasound.  Please let us know if you have not been contacted within one week.   It was a pleasure to see you today!

## 2017-06-06 NOTE — Progress Notes (Signed)
Subjective:    Patient ID: KRYSTINA STRIETER, female    DOB: [birth year], 72 y.o.   MRN: [id number]  HPI  Ms. Rotz is a 72 year old female who presents today with a chief complaint of back pain.  She was last evaluated on 05/01/17 with complaint of mid lateral posterior thoracic back pain which began several days prior after sneezing. She denied trauma/injury. She was taking Tylenol with improvement. Her exam was stable so she was encouraged to follow up with symptoms persisted.  Since her last visit her pain persists. Her pain is still located to the left thoracic back that is worse with laying on her left size, coughing, sneezing. She denies numbness/tingling to her back, trauma, rashes. Her pain is not sharp, shooting, tingling. She thinks she's noticed a mass to the area of concern.   Review of Systems  Musculoskeletal: Positive for back pain.  Skin: Negative for color change.       Skin mass  Neurological: Negative for weakness and numbness.       Past Medical History:  Diagnosis Date  . Anxiety   . COPD (chronic obstructive pulmonary disease) ([facility])   . Epistaxis 01/09/2017  . GERD (gastroesophageal reflux disease)   . Hypertension   . New onset of headaches   . Osteopenia   . PMR (polymyalgia rheumatica) ([facility])   . Strain of chest wall 01/20/2011  . Tobacco abuse      Social History   Socioeconomic History  . Marital status: Married    Spouse name: Not on file  . Number of children: 1  . Years of education: Not on file  . Highest education level: Not on file  Occupational History  . 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 Financial resource strain: Not on file  . Food insecurity:    Worry: Not on file    Inability: Not on file  . Transportation needs:    Medical: Not on file    Non-medical: Not on file  Tobacco Use  . Smoking status: Former Smoker    Packs/day: 1.50    Years: 40.00    Pack  years: 60.00    Types: Cigarettes    Last attempt to quit: 02/11/2009    Years since quitting: 8.3  . Smokeless tobacco: Never Used  Substance and Sexual Activity  . Alcohol use: No    Alcohol/week: 0.0 oz  . Drug use: No  . Sexual activity: Never  Lifestyle  . Physical activity:    Days per week: Not on file    Minutes per session: Not on file  . Stress: Not on file  Relationships  . Social connections:    Talks on phone: Not on file    Gets together: Not on file    Attends religious service: Not on file    Active member of club or organization: Not on file    Attends meetings of clubs or organizations: Not on file    Relationship status: Not on file  . Intimate partner violence:    Fear of current or ex partner: Not on file    Emotionally abused: Not on file    Physically abused: Not on file    Forced sexual activity: Not on file  Other Topics Concern  . Not on file  Social History Narrative   Lives in [place] with her husband.  Takes care of her two  grandchildren, ages 33 and 2, everyday in [place].  Has one son who is 75.   Desires CPR   Would not want prolonged life support if futile    Past Surgical History:  Procedure Laterality Date  . ABDOMINAL HYSTERECTOMY    . APPENDECTOMY    . BACK SURGERY    . CARPAL TUNNEL RELEASE Left   . CERVICAL FUSION    . CHOLECYSTECTOMY    . HEMORRHOID SURGERY    . LUMBAR FUSION    . TUBAL LIGATION      Family History  Problem Relation Age of Onset  . Heart disease Mother   . Cancer Father        lung/chest wall   . Prostate cancer Father   . Diabetes Brother        siblings  . Hypertension Sister        x 2  . Heart disease Brother   . Breast cancer Paternal Grandmother   . Heart disease Sister   . Diabetes Sister   . Tuberculosis Sister   . Cirrhosis Brother     Allergies  Allergen Reactions  . Prednisone Anaphylaxis, Hives, Shortness Of Breath, Itching and Swelling    The face swells  . Prevnar 13  [Pneumococcal 13-Val Conj Vacc] Other (See Comments)    Reaction not recalled by the patient  . Food Rash    MANGO = cause rashes around the mouth    Current Outpatient Medications on File Prior to Visit  Medication Sig Dispense Refill  . levothyroxine (SYNTHROID, LEVOTHROID) 75 MCG tablet Take 1 tablet by mouth every morning on empty stomach with a full glass of water. 90 tablet 3  . lisinopril (PRINIVIL,ZESTRIL) 5 MG tablet Take 1 tablet (5 mg total) by mouth daily. 30 tablet 0  . oxymetazoline (AFRIN) 0.05 % nasal spray Place 1 spray into both nostrils 2 (two) times daily as needed for congestion (nose bleed). 30 mL 0   No current facility-administered medications on file prior to visit.     BP 124/82   Pulse 73   Temp 98.1 F (36.7 C) (Oral)   Ht 4\' 11"  (1.499 m)   Wt 183 lb 12 oz (83.3 kg)   SpO2 94%   BMI 37.11 kg/m    Objective:   Physical Exam  Constitutional: She appears well-nourished.  Cardiovascular: Normal rate.  Respiratory: Effort normal and breath sounds normal. She has no wheezes.  Musculoskeletal:       Back:  Pain to left lateral thoracic back. Mass to area of T6. Tender.   Skin: Skin is warm and dry. No rash noted. No erythema.  Deep mass palpated to left mid thoracic back. See picture.           Assessment & Plan:

## 2017-06-14 ENCOUNTER — Ambulatory Visit
Admission: RE | Admit: 2017-06-14 | Discharge: 2017-06-14 | Disposition: A | Discharge status: Home / Self Care | Payer: Medicare Other | Source: Ambulatory Visit | Attending: Primary Care | Admitting: Primary Care

## 2017-06-14 DIAGNOSIS — M546 Pain in thoracic spine: Secondary | ICD-10-CM | POA: Insufficient documentation

## 2017-06-14 DIAGNOSIS — L989 Disorder of the skin and subcutaneous tissue, unspecified: Secondary | ICD-10-CM | POA: Diagnosis not present

## 2017-06-14 DIAGNOSIS — R229 Localized swelling, mass and lump, unspecified: Secondary | ICD-10-CM

## 2017-06-24 ENCOUNTER — Encounter: Payer: Self-pay | Admitting: Primary Care

## 2017-08-27 ENCOUNTER — Encounter: Payer: Self-pay | Admitting: Primary Care

## 2017-08-27 ENCOUNTER — Ambulatory Visit (INDEPENDENT_AMBULATORY_CARE_PROVIDER_SITE_OTHER): Payer: Medicare Other | Admitting: Primary Care

## 2017-08-27 VITALS — BP 122/80 | HR 84 | Temp 98.2°F | Ht 59.0 in | Wt 185.5 lb

## 2017-08-27 DIAGNOSIS — R220 Localized swelling, mass and lump, head: Secondary | ICD-10-CM | POA: Diagnosis not present

## 2017-08-27 NOTE — Progress Notes (Signed)
Subjective:    Patient ID: Teresa Jensen, female    DOB: 12-03-1945, 72 y.o.   MRN: [id number]  HPI  Teresa Jensen is a 72 year old female who presents today with a chief complaint of scalp bump.  The bump has been noticeable over the last one month. The mass is overall smaller than it was originally, originally the size of a pencil eraser. Over the last one week the mass has significantly decreased in size.   She denies itching, redness, fevers, insect bites. She has picked at the site and noticed soreness at that time. Her husband has monitored the site and has never been able to visualize anything.   Review of Systems  Constitutional: Negative for fever.  Skin: Negative for color change, rash and wound.       Scalp mass       Past Medical History:  Diagnosis Date  . Anxiety   . COPD (chronic obstructive pulmonary disease) ([facility])   . Epistaxis 01/09/2017  . GERD (gastroesophageal reflux disease)   . Hypertension   . New onset of headaches   . Osteopenia   . PMR (polymyalgia rheumatica) ([facility])   . Stasis dermatitis   . Strain of chest wall 01/20/2011  . Tobacco abuse      Social History   Socioeconomic History  . Marital status: Married    Spouse name: Not on file  . Number of children: 1  . Years of education: Not on file  . Highest education level: Not on file  Occupational History  . 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 Financial resource strain: Not on file  . Food insecurity:    Worry: Not on file    Inability: Not on file  . Transportation needs:    Medical: Not on file    Non-medical: Not on file  Tobacco Use  . Smoking status: Former Smoker    Packs/day: 1.50    Years: 40.00    Pack years: 60.00    Types: Cigarettes    Last attempt to quit: 02/11/2009    Years since quitting: 8.5  . Smokeless tobacco: Never Used  Substance and Sexual Activity  . Alcohol use: No    Alcohol/week:  0.0 standard drinks  . Drug use: No  . Sexual activity: Never  Lifestyle  . Physical activity:    Days per week: Not on file    Minutes per session: Not on file  . Stress: Not on file  Relationships  . Social connections:    Talks on phone: Not on file    Gets together: Not on file    Attends religious service: Not on file    Active member of club or organization: Not on file    Attends meetings of clubs or organizations: Not on file    Relationship status: Not on file  . Intimate partner violence:    Fear of current or ex partner: Not on file    Emotionally abused: Not on file    Physically abused: Not on file    Forced sexual activity: Not on file  Other Topics Concern  . Not on file  Social History Narrative   Lives in [place] with her husband.  Takes care of her two grandchildren, ages 80 and 2, everyday in [place].  Has one son who is 27.   Desires CPR   Would not want prolonged  life support if futile    Past Surgical History:  Procedure Laterality Date  . ABDOMINAL HYSTERECTOMY    . APPENDECTOMY    . BACK SURGERY    . CARPAL TUNNEL RELEASE Left   . CERVICAL FUSION    . CHOLECYSTECTOMY    . HEMORRHOID SURGERY    . LUMBAR FUSION    . TUBAL LIGATION      Family History  Problem Relation Age of Onset  . Heart disease Mother   . Cancer Father        lung/chest wall   . Prostate cancer Father   . Diabetes Brother        siblings  . Hypertension Sister        x 2  . Heart disease Brother   . Breast cancer Paternal Grandmother   . Heart disease Sister   . Diabetes Sister   . Tuberculosis Sister   . Cirrhosis Brother     Allergies  Allergen Reactions  . Prednisone Anaphylaxis, Hives, Shortness Of Breath, Itching and Swelling    The face swells  . Prevnar 13 [Pneumococcal 13-Val Conj Vacc] Other (See Comments)    Reaction not recalled by the patient  . Food Rash    MANGO = cause rashes around the mouth    Current Outpatient Medications on File  Prior to Visit  Medication Sig Dispense Refill  . levothyroxine (SYNTHROID, LEVOTHROID) 75 MCG tablet Take 1 tablet by mouth every morning on empty stomach with a full glass of water. 90 tablet 3  . lisinopril (PRINIVIL,ZESTRIL) 5 MG tablet Take 1 tablet (5 mg total) by mouth daily. 30 tablet 0  . oxymetazoline (AFRIN) 0.05 % nasal spray Place 1 spray into both nostrils 2 (two) times daily as needed for congestion (nose bleed). 30 mL 0   No current facility-administered medications on file prior to visit.     BP 122/80   Pulse 84   Temp 98.2 F (36.8 C) (Oral)   Ht 4\' 11"  (1.499 m)   Wt 185 lb 8 oz (84.1 kg)   SpO2 94%   BMI 37.47 kg/m    Objective:   Physical Exam  Constitutional: She appears well-nourished.  Cardiovascular: Normal rate and regular rhythm.  Respiratory: Effort normal and breath sounds normal.  Skin: Skin is warm and dry. No erythema.  0.25 cm rounded, slightly raised mass to left lateral occipital lobe. Non tender, immobile, flesh colored. Small scab overlying site.            Assessment & Plan:  Sebaceous cyst:  Scalp mass x 1 month, overall decreased in size. Exam today without evidence of infection, insect bite. Appears to be benign sebaceous cyst, reassurance provided. Will have patient monitor site, return precautions provided.  Pleas Koch, NP

## 2017-08-27 NOTE — Patient Instructions (Signed)
Please notify me if the mass increases in size and/or becomes painful.  It was a pleasure to see you today!

## 2017-09-30 ENCOUNTER — Encounter: Payer: Self-pay | Admitting: Primary Care

## 2017-10-15 NOTE — Telephone Encounter (Signed)
Noted. Done.

## 2017-10-15 NOTE — Telephone Encounter (Signed)
Please update patient's chart, thanks.

## 2017-10-17 ENCOUNTER — Ambulatory Visit (INDEPENDENT_AMBULATORY_CARE_PROVIDER_SITE_OTHER): Payer: Medicare Other | Admitting: Primary Care

## 2017-10-17 ENCOUNTER — Other Ambulatory Visit: Payer: Self-pay | Admitting: Primary Care

## 2017-10-17 VITALS — BP 124/84 | HR 82 | Temp 98.2°F | Ht 59.0 in | Wt 184.5 lb

## 2017-10-17 DIAGNOSIS — E038 Other specified hypothyroidism: Secondary | ICD-10-CM | POA: Diagnosis not present

## 2017-10-17 DIAGNOSIS — Z23 Encounter for immunization: Secondary | ICD-10-CM

## 2017-10-17 DIAGNOSIS — I10 Essential (primary) hypertension: Secondary | ICD-10-CM

## 2017-10-17 DIAGNOSIS — R7303 Prediabetes: Secondary | ICD-10-CM | POA: Diagnosis not present

## 2017-10-17 DIAGNOSIS — M1990 Unspecified osteoarthritis, unspecified site: Secondary | ICD-10-CM

## 2017-10-17 DIAGNOSIS — Z0000 Encounter for general adult medical examination without abnormal findings: Secondary | ICD-10-CM

## 2017-10-17 DIAGNOSIS — K219 Gastro-esophageal reflux disease without esophagitis: Secondary | ICD-10-CM

## 2017-10-17 DIAGNOSIS — Z1239 Encounter for other screening for malignant neoplasm of breast: Secondary | ICD-10-CM

## 2017-10-17 DIAGNOSIS — J449 Chronic obstructive pulmonary disease, unspecified: Secondary | ICD-10-CM

## 2017-10-17 DIAGNOSIS — F418 Other specified anxiety disorders: Secondary | ICD-10-CM

## 2017-10-17 DIAGNOSIS — E2839 Other primary ovarian failure: Secondary | ICD-10-CM

## 2017-10-17 LAB — COMPREHENSIVE METABOLIC PANEL
ALT: 32 U/L (ref 0–35)
AST: 38 U/L — ABNORMAL HIGH (ref 0–37)
Albumin: 4.1 g/dL (ref 3.5–5.2)
Alkaline Phosphatase: 71 U/L (ref 39–117)
BUN: 10 mg/dL (ref 6–23)
CO2: 29 mEq/L (ref 19–32)
Calcium: 9.4 mg/dL (ref 8.4–10.5)
Chloride: 99 mEq/L (ref 96–112)
Creatinine, Ser: 0.65 mg/dL (ref 0.40–1.20)
GFR: 95.08 mL/min (ref >60.00)
Glucose, Bld: 117 mg/dL — ABNORMAL HIGH (ref 70–99)
Potassium: 4 mEq/L (ref 3.5–5.1)
Sodium: 137 mEq/L (ref 135–145)
Total Bilirubin: 0.5 mg/dL (ref 0.2–1.2)
Total Protein: 8.1 g/dL (ref 6.0–8.3)

## 2017-10-17 LAB — HEMOGLOBIN A1C: Hgb A1c MFr Bld: 6.4 % (ref 4.6–6.5)

## 2017-10-17 LAB — LIPID PANEL
Cholesterol: 145 mg/dL (ref 0–200)
HDL: 40.8 mg/dL (ref >39.00)
LDL Cholesterol: 84 mg/dL (ref 0–99)
NonHDL: 104.68
Total CHOL/HDL Ratio: 4
Triglycerides: 104 mg/dL (ref 0.0–149.0)
VLDL: 20.8 mg/dL (ref 0.0–40.0)

## 2017-10-17 LAB — TSH: TSH: 2.6 u[IU]/mL (ref 0.35–4.50)

## 2017-10-17 MED ORDER — ALPRAZOLAM 0.25 MG PO TABS: ORAL_TABLET | ORAL | 0 refills | Status: DC

## 2017-10-17 NOTE — Assessment & Plan Note (Signed)
Chronic to knees. Following with orthopedics for symptoms to upper extremities and hands. Discussed importance of regular exercise, stretching.

## 2017-10-17 NOTE — Addendum Note (Signed)
Addended by: Jacqualin Combes on: 10/17/2017 02:15 PM   Modules accepted: Orders

## 2017-10-17 NOTE — Assessment & Plan Note (Signed)
Infrequent symptoms. Continue to monitor.

## 2017-10-17 NOTE — Assessment & Plan Note (Signed)
Immunizations UTD. Shingles vaccination not covered by insurance. Influenza vaccination provided today. Mammogram and bone density due, orders placed. Colon cancer screening UTD. Discussed to work on a healthy diet and get regular exercise. All recommendations provided during visit. Exam stable. Labs reviewed.   I have personally reviewed and have noted: 1. The patient's medical and social history 2. Their use of alcohol, tobacco or illicit drugs 3. Their current medications and supplements 4. The patient's functional ability including ADL's, fall risks, home safety risks  and hearing or visual impairment. 5. Diet and physical activities 6. Evidence for depression or mood disorder

## 2017-10-17 NOTE — Assessment & Plan Note (Signed)
Repeat A1C pending. Discussed to work on diet, get regular exercise.

## 2017-10-17 NOTE — Assessment & Plan Note (Signed)
Using alprazolam sparingly, 20 tablets lasts one year. Discussed to continue to use sparingly, discussed potential side effects.

## 2017-10-17 NOTE — Progress Notes (Signed)
Patient ID: Teresa Jensen, female   DOB: [birth year], 72 y.o.   MRN: [id number]  Teresa Jensen is a 72 year old female who presents today for [place].  HPI:  Past Medical History:  Diagnosis Date  . Anxiety   . COPD (chronic obstructive pulmonary disease) ([facility])   . Epistaxis 01/09/2017  . GERD (gastroesophageal reflux disease)   . Hypertension   . New onset of headaches   . Osteopenia   . PMR (polymyalgia rheumatica) ([facility])   . Stasis dermatitis   . Strain of chest wall 01/20/2011  . Tobacco abuse     Current Outpatient Medications  Medication Sig Dispense Refill  . clobetasol cream (TEMOVATE) 3.23 % Apply 1 application topically 2 (two) times daily.    Marland Kitchen levothyroxine (SYNTHROID, LEVOTHROID) 75 MCG tablet Take 1 tablet by mouth every morning on empty stomach with a full glass of water. 90 tablet 3  . lisinopril (PRINIVIL,ZESTRIL) 5 MG tablet Take 1 tablet (5 mg total) by mouth daily. 30 tablet 0  . oxymetazoline (AFRIN) 0.05 % nasal spray Place 1 spray into both nostrils 2 (two) times daily as needed for congestion (nose bleed). 30 mL 0   No current facility-administered medications for this visit.     Allergies  Allergen Reactions  . Prednisone Anaphylaxis, Hives, Shortness Of Breath, Itching and Swelling    The face swells  . Prevnar 13 [Pneumococcal 13-Val Conj Vacc] Other (See Comments)    Reaction not recalled by the patient  . Food Rash    MANGO = cause rashes around the mouth    Family History  Problem Relation Age of Onset  . Heart disease Mother   . Cancer Father        lung/chest wall   . Prostate cancer Father   . Diabetes Brother        siblings  . Hypertension Sister        x 2  . Heart disease Brother   . Breast cancer Paternal Grandmother   . Heart disease Sister   . Diabetes Sister   . Tuberculosis Sister   . Cirrhosis Brother     Social History   Socioeconomic History  . Marital status: Married    Spouse name: Not on file  . Number of children: 1   . Years of education: Not on file  . Highest education level: Not on file  Occupational History  . 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 Financial resource strain: Not on file  . Food insecurity:    Worry: Not on file    Inability: Not on file  . Transportation needs:    Medical: Not on file    Non-medical: Not on file  Tobacco Use  . Smoking status: Former Smoker    Packs/day: 1.50    Years: 40.00    Pack years: 60.00    Types: Cigarettes    Last attempt to quit: 02/11/2009    Years since quitting: 8.6  . Smokeless tobacco: Never Used  Substance and Sexual Activity  . Alcohol use: No    Alcohol/week: 0.0 standard drinks  . Drug use: No  . Sexual activity: Never  Lifestyle  . Physical activity:    Days per week: Not on file    Minutes per session: Not on file  . Stress: Not on file  Relationships  . Social connections:    Talks on phone:  Not on file    Gets together: Not on file    Attends religious service: Not on file    Active member of club or organization: Not on file    Attends meetings of clubs or organizations: Not on file    Relationship status: Not on file  . Intimate partner violence:    Fear of current or ex partner: Not on file    Emotionally abused: Not on file    Physically abused: Not on file    Forced sexual activity: Not on file  Other Topics Concern  . Not on file  Social History Narrative   Lives in [place] with her husband.  Takes care of her two grandchildren, ages 29 and 2, everyday in [place].  Has one son who is 32.   Desires CPR   Would not want prolonged life support if futile    Hospitiliaztions: January 2019  Health Maintenance:    Flu: Due today  Tetanus: Completed in 2010  Pneumonia: Completed in 2016  Zostavax: Never completed, insurance does not cover  Bone Density: Completed in 2017, osteoporosis   Colonoscopy: Completed in 2013  Eye Doctor: Due,  plans on scheduling for this year.   Dental Exam: Completes annually   Mammogram: Completed in 2018  Hep C Screening: Negative in 2017    Providers: Alma Friendly, PCP; Dr. Rogers Blocker, Orthopedics; Dr. Laurence Ferrari, dermatologist; Dr. Manuella Ghazi, Neurologist.    I have personally reviewed and have noted: 1. The patient's medical and social history 2. Their use of alcohol, tobacco or illicit drugs 3. Their current medications and supplements 4. The patient's functional ability including ADL's, fall risks, home safety risks  and hearing or visual impairment. 5. Diet and physical activities 6. Evidence for depression or mood disorder  Subjective:   Review of Systems:   Constitutional: Denies fever, malaise, fatigue, headache or abrupt weight changes.  HEENT: Denies eye pain, eye redness, ear pain, ringing in the ears, wax buildup, runny nose, nasal congestion, bloody nose, or sore throat. Respiratory: Denies difficulty breathing, shortness of breath, cough or sputum production.   Cardiovascular: Denies chest pain, chest tightness, palpitations or swelling in the hands or feet.  Gastrointestinal: Denies abdominal pain, bloating, constipation, diarrhea or blood in the stool.  GU: Denies urgency, frequency, pain with urination, burning sensation, blood in urine, odor or discharge. Musculoskeletal: Denies decrease in range of motion, difficulty with gait, muscle pain or joint pain and swelling. She does experience chronic hand pain with numbness. Chronic bilateral knee pain. Skin: Denies redness, rashes, lesions or ulcercations. Dry skin. Follows with dermatology. Neurological: Denies dizziness, difficulty with memory, difficulty with speech or problems with balance and coordination.  Psychiatric: Denies concerns for anxiety or depression.   No other specific complaints in a complete review of systems (except as listed in HPI above).  Objective:  PE:   BP Readings from Last 3 Encounters:  10/17/17  124/84  08/27/17 122/80  06/06/17 124/82     General: Appears their stated age, well developed, well nourished in NAD. Skin: Warm, dry and intact. No rashes, lesions or ulcerations noted. HEENT: Head: normal shape and size; Eyes: sclera white, no icterus, conjunctiva pink, PERRLA and EOMs intact; Ears: Tm's gray and intact, normal light reflex; Nose: mucosa pink and moist, septum midline; Throat/Mouth: Teeth present, mucosa pink and moist, no exudate, lesions or ulcerations noted.  Neck: Normal range of motion. Neck supple, trachea midline. No massses, lumps or thyromegaly present.  Cardiovascular: Normal rate and  rhythm. S1,S2 noted.  No murmur, rubs or gallops noted. No JVD or BLE edema. No carotid bruits noted. Pulmonary/Chest: Normal effort and positive vesicular breath sounds. No respiratory distress. No wheezes, rales or ronchi noted.  Abdomen: Soft and nontender. Normal bowel sounds, no bruits noted. No distention or masses noted. Liver, spleen and kidneys non palpable. Musculoskeletal: Normal range of motion. No signs of joint swelling. No difficulty with gait.  Neurological: Alert and oriented. Cranial nerves II-XII intact. Coordination normal. +DTRs bilaterally. Psychiatric: Mood and affect normal. Behavior is normal. Judgment and thought content normal.    BMET    Component Value Date/Time   NA 137 01/25/2017 0925   K 3.7 01/25/2017 0925   CL 99 01/25/2017 0925   CO2 27 01/25/2017 0925   GLUCOSE 132 (H) 01/25/2017 0925   BUN 11 01/25/2017 0925   CREATININE 0.62 01/25/2017 0925   CALCIUM 8.7 01/25/2017 0925   GFRNONAA >60 01/14/2017 0456   GFRAA >60 01/14/2017 0456    Lipid Panel     Component Value Date/Time   CHOL 135 10/10/2016 0830   TRIG 93.0 10/10/2016 0830   HDL 48.20 10/10/2016 0830   CHOLHDL 3 10/10/2016 0830   VLDL 18.6 10/10/2016 0830   LDLCALC 68 10/10/2016 0830    CBC    Component Value Date/Time   WBC 9.6 04/12/2017 0839   RBC 4.81 04/12/2017  0839   HGB 12.5 04/12/2017 0839   HCT 39.0 04/12/2017 0839   PLT 359.0 04/12/2017 0839   MCV 81.0 04/12/2017 0839   MCH 29.7 01/14/2017 0456   MCHC 32.0 04/12/2017 0839   RDW 18.0 (H) 04/12/2017 0839   LYMPHSABS 2.1 04/12/2017 0839   MONOABS 1.0 04/12/2017 0839   EOSABS 0.1 04/12/2017 0839   BASOSABS 0.0 04/12/2017 0839    Hgb A1C Lab Results  Component Value Date   HGBA1C 6.1 10/10/2016      Assessment and Plan:   Medicare Annual Wellness Visit:  Physical activity: Active around her home Depression/mood screen: Negative Hearing: Intact to whispered voice Visual acuity: Grossly normal, performs annual eye exam  ADLs: Capable Fall risk: None Home safety: Good Cognitive evaluation: Intact to orientation, naming, recall and repetition EOL planning: Adv directives completed.   Preventative Medicine: Immunizations UTD. Shingles vaccination not covered by insurance. Influenza vaccination provided today. Mammogram and bone density due, orders placed. Colon cancer screening UTD. Discussed to work on a healthy diet and get regular exercise. All recommendations provided during visit. Exam stable. Labs reviewed.   Next appointment: One year.

## 2017-10-17 NOTE — Assessment & Plan Note (Signed)
Stable in the office today, continue to monitor.  

## 2017-10-17 NOTE — Patient Instructions (Addendum)
Use the alprazolam (Xanax) sparingly as needed for anxiety.   Stop by the lab prior to leaving today. I will notify you of your results once received.   Call the [facility] to schedule your mammogram and bone density tests.   Start exercising. You should be getting 150 minutes of moderate intensity exercise weekly.  Make sure to eat a healthy diet with plenty of vegetables, fruit, whole grains, lean protein.  Ensure you are consuming 64 ounces of water daily.  We will see you in one year for your annual exam or sooner if needed.  It was a pleasure to see you today!

## 2017-10-17 NOTE — Assessment & Plan Note (Signed)
Intermittent shortness of breath with moderate exertion. Not using albuterol inhaler as she doesn't need it.

## 2017-10-17 NOTE — Assessment & Plan Note (Signed)
Repeat TSH pending, continue levothyroxine 75 mcg for now.

## 2017-10-22 ENCOUNTER — Emergency Department
Admission: EM | Admit: 2017-10-22 | Discharge: 2017-10-22 | Disposition: A | Discharge status: Home / Self Care | Payer: Medicare Other | Attending: Emergency Medicine | Admitting: Emergency Medicine

## 2017-10-22 ENCOUNTER — Encounter: Payer: Self-pay | Admitting: Emergency Medicine

## 2017-10-22 ENCOUNTER — Other Ambulatory Visit: Payer: Self-pay

## 2017-10-22 DIAGNOSIS — Z79899 Other long term (current) drug therapy: Secondary | ICD-10-CM | POA: Insufficient documentation

## 2017-10-22 DIAGNOSIS — R040 Epistaxis: Secondary | ICD-10-CM | POA: Insufficient documentation

## 2017-10-22 DIAGNOSIS — J449 Chronic obstructive pulmonary disease, unspecified: Secondary | ICD-10-CM | POA: Insufficient documentation

## 2017-10-22 DIAGNOSIS — E039 Hypothyroidism, unspecified: Secondary | ICD-10-CM | POA: Diagnosis not present

## 2017-10-22 DIAGNOSIS — I10 Essential (primary) hypertension: Secondary | ICD-10-CM | POA: Diagnosis not present

## 2017-10-22 DIAGNOSIS — Z87891 Personal history of nicotine dependence: Secondary | ICD-10-CM | POA: Insufficient documentation

## 2017-10-22 DIAGNOSIS — I6522 Occlusion and stenosis of left carotid artery: Secondary | ICD-10-CM

## 2017-10-22 MED ORDER — TRANEXAMIC ACID 1000 MG/10ML IV SOLN
500.0000 mg | Freq: Once | INTRAVENOUS | Status: AC
Start: 2017-10-22 — End: 2017-10-22
  Administered 2017-10-22: 500 mg via TOPICAL
  Filled 2017-10-22: qty 10

## 2017-10-22 MED ORDER — OXYMETAZOLINE HCL 0.05 % NA SOLN
1.0000 | Freq: Once | NASAL | Status: AC
  Administered 2017-10-22: 1 via NASAL
  Filled 2017-10-22: qty 15

## 2017-10-22 NOTE — ED Notes (Signed)
Pt up to restroom with steady gait. Husband at the bedside. Pt states bleeding seems to be under control at this time and reports it doesn't seem to be going down the back of her throat.

## 2017-10-22 NOTE — ED Notes (Signed)
Pt up to restroom with steady gait. ENT cart at the bedside. No distress noted.

## 2017-10-22 NOTE — ED Provider Notes (Signed)
[facility] Emergency Department Provider Note  ____________________________________________   First MD Initiated Contact with Patient 10/22/17 [phone]     (approximate)  I have reviewed the triage vital signs and the nursing notes.   HISTORY  Chief Complaint Epistaxis    HPI Teresa Jensen is a 72 y.o. female whose medical history includes prior right sided epistaxis that required multiple different treatment modalities and eventually had to be seen by an ENT with bedside ablation.  This was approximately 9 or 10 months ago at [facility].  She presents tonight for acute onset of right sided nasal bleeding.  It started spontaneously at about 2 AM and at the time that I saw the patient it had stopped on its own but she feels like there is a clot in place.  Her blood pressure was elevated but she does not take medicine regularly for blood pressure and she thinks it goes up when she has a nosebleed because she is anxious and worried.  She is in no distress right now but obviously anxious about the bleeding.  She reports that it was acute in onset and severe and it got better on its own.  She did not use any medication was not specifically applying pressure to her nose.  She is not on blood thinners.  She denies dizziness, chest pain, shortness of breath, nausea, vomiting, and abdominal pain.  Past Medical History:  Diagnosis Date  . Anxiety   . COPD (chronic obstructive pulmonary disease) ([place])   . Epistaxis 01/09/2017  . GERD (gastroesophageal reflux disease)   . Hypertension   . New onset of headaches   . Osteopenia   . PMR (polymyalgia rheumatica) (H[facility])   . Stasis dermatitis   . Strain of chest wall 01/20/2011  . Tobacco abuse     Patient Active Problem List   Diagnosis Date Noted  . Back pain 06/06/2017  . Low vitamin B12 level 01/13/2017  . Iatrogenic hypotension 01/12/2017  . Acute kidney failure ([place]) 01/12/2017  . Anemia 01/12/2017  . Essential  hypertension 01/08/2017  . Epistaxis 01/07/2017  . Prediabetes 10/17/2015  . Herpes zoster 07/08/2015  . Pain of right lower extremity 05/20/2015  . Medicare annual wellness visit, subsequent 10/12/2014  . Occipital neuralgia 10/12/2014  . Vitamin D deficiency 10/12/2014  . Erythema of lower extremity 09/08/2013  . COPD (chronic obstructive pulmonary disease) ([place]) 06/05/2010  . Hypothyroidism 09/29/2009  . Situational anxiety 10/01/2008  . GERD 10/01/2008  . Osteoarthritis 10/01/2008    Past Surgical History:  Procedure Laterality Date  . ABDOMINAL HYSTERECTOMY    . APPENDECTOMY    . BACK SURGERY    . CARPAL TUNNEL RELEASE Left   . CERVICAL FUSION    . CHOLECYSTECTOMY    . HEMORRHOID SURGERY    . LUMBAR FUSION    . TUBAL LIGATION      Prior to Admission medications   Medication Sig Start Date End Date Taking? Authorizing Provider  ALPRAZolam Duanne Moron) 0.25 MG tablet Take 1 tablet by mouth daily as needed for anxiety. 10/17/17   Pleas Koch, NP  clobetasol cream (TEMOVATE) 6.01 % Apply 1 application topically 2 (two) times daily.    [provider]  levothyroxine (SYNTHROID, LEVOTHROID) 75 MCG tablet Take 1 tablet by mouth every morning on empty stomach with a full glass of water. 12/05/16   Pleas Koch, NP  oxymetazoline (AFRIN) 0.05 % nasal spray Place 1 spray into both nostrils 2 (two) times  daily as needed for congestion (nose bleed). 01/10/17   Dana Allan I, MD  pimecrolimus (ELIDEL) 1 % cream Apply topically 2 (two) times daily.    [provider]    Allergies Prednisone; Prevnar 13 [pneumococcal 13-val conj vacc]; and Food  Family History  Problem Relation Age of Onset  . Heart disease Mother   . Cancer Father        lung/chest wall   . Prostate cancer Father   . Diabetes Brother        siblings  . Hypertension Sister        x 2  . Heart disease Brother   . Breast cancer Paternal Grandmother   . Heart disease Sister   .  Diabetes Sister   . Tuberculosis Sister   . Cirrhosis Brother     Social History Social History   Tobacco Use  . Smoking status: Former Smoker    Packs/day: 1.50    Years: 40.00    Pack years: 60.00    Types: Cigarettes    Last attempt to quit: 02/11/2009    Years since quitting: 8.6  . Smokeless tobacco: Never Used  Substance Use Topics  . Alcohol use: No    Alcohol/week: 0.0 standard drinks  . Drug use: No    Review of Systems Constitutional: No fever/chills Eyes: No visual changes. ENT: Right-sided epistaxis as described above. Cardiovascular: Denies chest pain. Respiratory: Denies shortness of breath. Gastrointestinal: No abdominal pain.  No nausea, no vomiting.  No diarrhea.  No constipation. Genitourinary: Negative for dysuria. Musculoskeletal: Negative for neck pain.  Negative for back pain. Integumentary: Negative for rash. Neurological: Negative for headaches, focal weakness or numbness.   ____________________________________________   PHYSICAL EXAM:  VITAL SIGNS: ED Triage Vitals  Enc Vitals Group     BP 10/22/17 0326 (!) 167/79     Pulse Rate 10/22/17 0326 90     Resp 10/22/17 0326 18     Temp 10/22/17 0326 98.2 F (36.8 C)     Temp Source 10/22/17 0326 Oral     SpO2 10/22/17 0326 92 %     Weight 10/22/17 0327 81.6 kg (180 lb)     Height 10/22/17 0327 1.448 m (4\' 9" )     Head Circumference --      Peak Flow --      Pain Score 10/22/17 0327 0     Pain Loc --      Pain Edu? --      Excl. in [place]? --     Constitutional: Alert and oriented.  Anxious but generally well appearing and in no acute distress. Eyes: Conjunctivae are normal.  Head: Atraumatic. Nose: Dried blood in right naris with no evidence of acute infection. Mouth/Throat: Mucous membranes are moist. Cardiovascular: Normal rate, regular rhythm. Good peripheral circulation. Respiratory: Normal respiratory effort.  No retractions. Lungs CTAB. Gastrointestinal: Soft and nontender. No  distention.  Musculoskeletal: No lower extremity tenderness nor edema. No gross deformities of extremities. Neurologic:  Normal speech and language. No gross focal neurologic deficits are appreciated.  Skin:  Skin is warm, dry and intact. No rash noted. Psychiatric: Mood and affect are normal. Speech and behavior are normal.  ____________________________________________   LABS (all labs ordered are listed, but only abnormal results are displayed)  Labs Reviewed - No data to display ____________________________________________  EKG  No EKG indicated ____________________________________________  RADIOLOGY   ED MD interpretation:  No indication for imaging  Official radiology report(s): No results found.  ____________________________________________   PROCEDURES  Critical Care performed: No   Procedure(s) performed:   .Epistaxis Management Date/Time: 10/22/2017 7:50 AM Performed by: Hinda Kehr, MD Authorized by: Hinda Kehr, MD   Consent:    Consent obtained:  Verbal   Consent given by:  Patient   Risks discussed:  Bleeding, infection, nasal injury and pain   Alternatives discussed:  No treatment Anesthesia (see MAR for exact dosages):    Anesthesia method:  None Procedure details:    Treatment site:  R anterior   Repair method: 2x2 gauze soaked in TXA after Afrin spray.   Treatment complexity:  Limited   Treatment episode: initial   Post-procedure details:    Assessment:  Bleeding stopped   Patient tolerance of procedure:  Tolerated well, no immediate complications     ____________________________________________   INITIAL IMPRESSION / ASSESSMENT AND PLAN / ED COURSE  As part of my medical decision making, I reviewed the following data within the [facility] notes reviewed and incorporated, Old chart reviewed and Notes from prior ED visits    Differential diagnosis includes idiopathic nosebleed, trauma, hypertensive  associated nosebleed, septal hematoma.  The patient has had complicated nosebleeds in the past requiring cauterization by ENT.  She has had no recent trauma.  She does not pick her nose.  She is hypertensive but I think this is secondary to her anxiety and is not the cause of the nosebleed.  By the time I saw her she was not having any bleeding but given her history I offered to treat her as if she was still having a mild bleed with Afrin nasal spray and TXA.  She would like to proceed with that.  I will administer a spray of Afrin and then pack some 2 x 2 gauze soaked in TXA in her nose for 20 to 30 minutes and then reassess.  She has limited ability to follow-up with ENT due to transportation issues and she has an appointment with Dr. Tami Ribas but not until next month.  I anticipate contacting Dr. Tami Ribas this morning to see if it would be possible for him to see her in his clinic later this morning or later today to alleviate some of her concerns and to further address any source of bleeding she may still have.  She agrees with this plan.   Clinical Course as of Oct 22 800  Tue Oct 22, 2017  0706 The patient has had no additional bleeding even after (or especially after) getting the Afrin nasal spray and the TXA which was left in place for about 30 minutes.  I called and spoke by phone with Dr. Tami Ribas and he agreed that he can see the patient this morning and will arrange this through his staff once he gets to the clinic.  I offered the patient that she can stay here and go directly to the clinic but she would prefer to go home first change closed.  I have asked Dr. Tami Ribas to have his staff reach out to her directly on her cell phone to let her know what time to show up.  The patient has had no bleeding since coming to the emergency department and is stable and ready for discharge.   [CF]    Clinical Course User Index [CF] Hinda Kehr, MD    ____________________________________________  FINAL  CLINICAL IMPRESSION(S) / ED DIAGNOSES  Final diagnoses:  Right-sided epistaxis     MEDICATIONS GIVEN DURING THIS VISIT:  Medications  tranexamic  acid (CYKLOKAPRON) injection 500 mg (500 mg Topical Given 10/22/17 0455)  oxymetazoline (AFRIN) 0.05 % nasal spray 1 spray (1 spray Each Nare Given 10/22/17 0450)     ED Discharge Orders    None       Note:  This document was prepared using Dragon voice recognition software and may include unintentional dictation errors.    Hinda Kehr, MD 10/22/17 [phone]

## 2017-10-22 NOTE — Discharge Instructions (Signed)

## 2017-10-22 NOTE — ED Triage Notes (Signed)
EMS from home , nose bleed (right nare) , hx of same approx one year ago.    EMS reports a BP of 190/100. Bleeding controled on arrival to ED

## 2017-10-29 DIAGNOSIS — I10 Essential (primary) hypertension: Secondary | ICD-10-CM

## 2017-10-30 MED ORDER — LISINOPRIL 5 MG PO TABS: 5.0000 mg | ORAL_TABLET | Freq: Every day | ORAL | 1 refills | Status: DC

## 2017-11-01 ENCOUNTER — Ambulatory Visit (INDEPENDENT_AMBULATORY_CARE_PROVIDER_SITE_OTHER): Payer: Medicare Other

## 2017-11-01 DIAGNOSIS — I6522 Occlusion and stenosis of left carotid artery: Secondary | ICD-10-CM | POA: Diagnosis not present

## 2017-11-15 ENCOUNTER — Other Ambulatory Visit: Payer: Self-pay | Admitting: Primary Care

## 2017-11-15 DIAGNOSIS — E038 Other specified hypothyroidism: Secondary | ICD-10-CM

## 2017-11-15 NOTE — Telephone Encounter (Signed)
Pt seen for annual 10/17/17; last refill levothyroxine 75 mcg# 90 x 3 on 12/05/16. Refilled per protocol to [facility].

## 2017-11-18 ENCOUNTER — Other Ambulatory Visit: Payer: Self-pay

## 2017-11-21 ENCOUNTER — Ambulatory Visit
Admission: RE | Admit: 2017-11-21 | Discharge: 2017-11-21 | Disposition: A | Discharge status: Home / Self Care | Payer: Medicare Other | Source: Ambulatory Visit | Attending: Primary Care | Admitting: Primary Care

## 2017-11-21 DIAGNOSIS — E2839 Other primary ovarian failure: Secondary | ICD-10-CM | POA: Insufficient documentation

## 2017-12-16 ENCOUNTER — Ambulatory Visit (INDEPENDENT_AMBULATORY_CARE_PROVIDER_SITE_OTHER): Payer: Medicare Other | Admitting: Primary Care

## 2017-12-16 VITALS — BP 124/86 | HR 85 | Temp 98.2°F | Ht 59.0 in | Wt 182.5 lb

## 2017-12-16 DIAGNOSIS — M810 Age-related osteoporosis without current pathological fracture: Secondary | ICD-10-CM

## 2017-12-16 DIAGNOSIS — M545 Low back pain, unspecified: Secondary | ICD-10-CM

## 2017-12-16 DIAGNOSIS — I6522 Occlusion and stenosis of left carotid artery: Secondary | ICD-10-CM | POA: Diagnosis not present

## 2017-12-16 MED ORDER — TIZANIDINE HCL 4 MG PO TABS: 4.0000 mg | ORAL_TABLET | Freq: Three times a day (TID) | ORAL | 0 refills | Status: DC | PRN

## 2017-12-16 NOTE — Patient Instructions (Signed)
You may take the Tizanidine every 8 hours as needed for muscle tightness/pain. Be careful as this may cause drowsiness.   You may take Tylenol as needed, do not exceed 3000 mg in 24 hours.  Make sure to stretch the back to increase mobility.  It was a pleasure to see you today!

## 2017-12-16 NOTE — Assessment & Plan Note (Signed)
Slight improvement in recent bone density when compared to prior. Cannot take bisphosphonate therpay due to "allergy" years ago. Discussed to maximize calcium and vitamin D.

## 2017-12-16 NOTE — Progress Notes (Signed)
Subjective:    Patient ID: Teresa Jensen, female    DOB: June 23, 1945, 72 y.o.   MRN: [id number]  HPI  Ms. Owusu is a 72 year old female with a history of osteoarthritis who presents today with a chief complaint of back pain.   Her pain is located to her mid lumbar spine. She stepped onto a rock the size of a golf ball and twisted her back pulling a muscle. This happened about one week ago. She denies falling. She's taken Tylenol without much improvement. She's also been sleeping on a heating pad at night.   She denies radiation of pain, numbness/tingling.   Review of Systems  Musculoskeletal: Positive for back pain.  Neurological: Negative for weakness and numbness.       Past Medical History:  Diagnosis Date  . Anxiety   . COPD (chronic obstructive pulmonary disease) ([place])   . Epistaxis 01/09/2017  . GERD (gastroesophageal reflux disease)   . Hypertension   . New onset of headaches   . Osteopenia   . PMR (polymyalgia rheumatica) ([facility])   . Stasis dermatitis   . Strain of chest wall 01/20/2011  . Tobacco abuse      Social History   Socioeconomic History  . Marital status: Married    Spouse name: Not on file  . Number of children: 1  . Years of education: Not on file  . Highest education level: Not on file  Occupational History  . 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 Financial resource strain: Not on file  . Food insecurity:    Worry: Not on file    Inability: Not on file  . Transportation needs:    Medical: Not on file    Non-medical: Not on file  Tobacco Use  . Smoking status: Former Smoker    Packs/day: 1.50    Years: 40.00    Pack years: 60.00    Types: Cigarettes    Last attempt to quit: 02/11/2009    Years since quitting: 8.8  . Smokeless tobacco: Never Used  Substance and Sexual Activity  . Alcohol use: No    Alcohol/week: 0.0 standard drinks  . Drug use: No  . Sexual activity:  Never  Lifestyle  . Physical activity:    Days per week: Not on file    Minutes per session: Not on file  . Stress: Not on file  Relationships  . Social connections:    Talks on phone: Not on file    Gets together: Not on file    Attends religious service: Not on file    Active member of club or organization: Not on file    Attends meetings of clubs or organizations: Not on file    Relationship status: Not on file  . Intimate partner violence:    Fear of current or ex partner: Not on file    Emotionally abused: Not on file    Physically abused: Not on file    Forced sexual activity: Not on file  Other Topics Concern  . Not on file  Social History Narrative   Lives in [place] with her husband.  Takes care of her two grandchildren, ages 23 and 2, everyday in [place].  Has one son who is 79.   Desires CPR   Would not want prolonged life support if futile    Past Surgical History:  Procedure Laterality Date  .  ABDOMINAL HYSTERECTOMY    . APPENDECTOMY    . BACK SURGERY    . CARPAL TUNNEL RELEASE Left   . CERVICAL FUSION    . CHOLECYSTECTOMY    . HEMORRHOID SURGERY    . LUMBAR FUSION    . TUBAL LIGATION      Family History  Problem Relation Age of Onset  . Heart disease Mother   . Cancer Father        lung/chest wall   . Prostate cancer Father   . Diabetes Brother        siblings  . Hypertension Sister        x 2  . Heart disease Brother   . Breast cancer Paternal Grandmother   . Heart disease Sister   . Diabetes Sister   . Tuberculosis Sister   . Cirrhosis Brother     Allergies  Allergen Reactions  . Prednisone Anaphylaxis, Hives, Shortness Of Breath, Itching and Swelling    The face swells  . Bisphosphonates Other (See Comments)    Unsure  . Prevnar 13 [Pneumococcal 13-Val Conj Vacc] Other (See Comments)    Reaction not recalled by the patient  . Food Rash    MANGO = cause rashes around the mouth    Current Outpatient Medications on File Prior to  Visit  Medication Sig Dispense Refill  . ALPRAZolam (XANAX) 0.25 MG tablet Take 1 tablet by mouth daily as needed for anxiety. 20 tablet 0  . clobetasol cream (TEMOVATE) 1.47 % Apply 1 application topically 2 (two) times daily.    Marland Kitchen levothyroxine (SYNTHROID, LEVOTHROID) 75 MCG tablet TAKE 1 TABLET BY MOUTH EVERY MORNING ON EMPTY STOMACH WITH A FULL GLASS OF WATER. 90 tablet 3  . lisinopril (PRINIVIL,ZESTRIL) 5 MG tablet Take 1 tablet (5 mg total) by mouth daily. For blood pressure. 90 tablet 1  . oxymetazoline (AFRIN) 0.05 % nasal spray Place 1 spray into both nostrils 2 (two) times daily as needed for congestion (nose bleed). 30 mL 0  . pimecrolimus (ELIDEL) 1 % cream Apply topically 2 (two) times daily.     No current facility-administered medications on file prior to visit.     BP 124/86   Pulse 85   Temp 98.2 F (36.8 C) (Oral)   Ht 4\' 11"  (1.499 m)   Wt 182 lb 8 oz (82.8 kg)   SpO2 94%   BMI 36.86 kg/m    Objective:   Physical Exam  Constitutional: She appears well-nourished.  Neck: Neck supple.  Cardiovascular: Normal rate and regular rhythm.  Respiratory: Effort normal and breath sounds normal.  Musculoskeletal:     Lumbar back: She exhibits decreased range of motion and pain. She exhibits no tenderness and no bony tenderness.       Back:     Comments: Mild decrease in ROM with flexion and extension of lumbar spine. Ambulatory in office without difficulty.  Skin: Skin is warm and dry.           Assessment & Plan:  Acute Back Pain:  Acute x 1 week, overall improving. Exam today suggestive of lumbar strain, no alarm signs. Rx for Tizanidine provided, Tylenol PRN. Discussed stretching heat/ice. Consider PT if no improvement.  Pleas Koch, NP

## 2018-01-02 ENCOUNTER — Other Ambulatory Visit: Payer: Self-pay | Admitting: Dermatology

## 2018-01-02 DIAGNOSIS — M79604 Pain in right leg: Secondary | ICD-10-CM

## 2018-01-03 ENCOUNTER — Other Ambulatory Visit: Payer: Self-pay | Admitting: Dermatology

## 2018-01-03 ENCOUNTER — Ambulatory Visit
Admission: RE | Admit: 2018-01-03 | Discharge: 2018-01-03 | Disposition: A | Discharge status: Home / Self Care | Payer: Medicare Other | Source: Ambulatory Visit | Attending: Dermatology | Admitting: Dermatology

## 2018-01-03 DIAGNOSIS — M79604 Pain in right leg: Secondary | ICD-10-CM

## 2018-01-06 ENCOUNTER — Ambulatory Visit: Payer: Medicare Other

## 2018-01-16 ENCOUNTER — Telehealth: Payer: Self-pay | Admitting: Primary Care

## 2018-01-16 NOTE — Telephone Encounter (Signed)
Pt called back stating she was experiencing sore throat with cough/congestion. Scheduled pt for tomorrow 01/17/18 with Anda Kraft.

## 2018-01-16 NOTE — Telephone Encounter (Signed)
I left msg for pt regarding a team health msg that stated she needed to schedule an appt. No further info was given. If pt calls back please sch appt or have pt triaged if symptoms require.

## 2018-01-17 ENCOUNTER — Encounter: Payer: Self-pay | Admitting: Primary Care

## 2018-01-17 ENCOUNTER — Ambulatory Visit (INDEPENDENT_AMBULATORY_CARE_PROVIDER_SITE_OTHER)
Admission: RE | Admit: 2018-01-17 | Discharge: 2018-01-17 | Disposition: A | Discharge status: Home / Self Care | Payer: Medicare Other | Source: Ambulatory Visit | Attending: Primary Care | Admitting: Primary Care

## 2018-01-17 ENCOUNTER — Ambulatory Visit (INDEPENDENT_AMBULATORY_CARE_PROVIDER_SITE_OTHER): Payer: Medicare Other | Admitting: Primary Care

## 2018-01-17 VITALS — BP 126/82 | HR 78 | Temp 98.2°F | Ht 59.0 in | Wt 176.0 lb

## 2018-01-17 DIAGNOSIS — J069 Acute upper respiratory infection, unspecified: Secondary | ICD-10-CM | POA: Diagnosis not present

## 2018-01-17 MED ORDER — GUAIFENESIN-CODEINE 100-10 MG/5ML PO SYRP: 5.0000 mL | ORAL_SOLUTION | Freq: Three times a day (TID) | ORAL | 0 refills | Status: DC | PRN

## 2018-01-17 NOTE — Patient Instructions (Addendum)
Complete xray(s) prior to leaving today. I will notify you of your results once received.  Your symptoms are representative of a viral illness which will resolve on its own over time. Our goal is to treat your symptoms in order to aid your body in the healing process and to make you more comfortable.   You may take the cough suppressant every 8 hours as needed for cough and rest. Caution this medication contains codeine which may cause drowsiness.   Start an antihistamine such as claritin, allegra, zyrtec. This will help reduce the drainage and watery eyes.   Please notify me if you develop persistent fevers of 101, notice increased fatigue or weakness, and/or feel worse after 1 week of onset of symptoms.   Increase consumption of water intake and rest.  It was a pleasure to see you today!

## 2018-01-17 NOTE — Progress Notes (Signed)
Subjective:    Patient ID: Teresa Jensen, female    DOB: 1945/08/25, 73 y.o.   MRN: [id number]  HPI  Teresa Jensen is a 73 year old female with a history of COPD, hypertension, GERD who presents today with a chief complaint of cough.  She also reports sore throat, chest congestion, post nasal drip, voice hoarseness, watery eyes, mild headache. Her symptoms began yesterday. She denies fevers, sick contacts. She's tried taking Tylenol, OTC cough medication with some improvement.    Review of Systems  Constitutional: Negative for chills and fever.  HENT: Positive for congestion, postnasal drip and sore throat.   Respiratory: Positive for cough.   Gastrointestinal: Negative for nausea.       Past Medical History:  Diagnosis Date  . Anxiety   . COPD (chronic obstructive pulmonary disease) ([place])   . Epistaxis 01/09/2017  . GERD (gastroesophageal reflux disease)   . Hypertension   . New onset of headaches   . Osteopenia   . PMR (polymyalgia rheumatica) ([facility])   . Stasis dermatitis   . Strain of chest wall 01/20/2011  . Tobacco abuse      Social History   Socioeconomic History  . Marital status: Married    Spouse name: Not on file  . Number of children: 1  . Years of education: Not on file  . Highest education level: Not on file  Occupational History  . 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 Financial resource strain: Not on file  . Food insecurity:    Worry: Not on file    Inability: Not on file  . Transportation needs:    Medical: Not on file    Non-medical: Not on file  Tobacco Use  . Smoking status: Former Smoker    Packs/day: 1.50    Years: 40.00    Pack years: 60.00    Types: Cigarettes    Last attempt to quit: 02/11/2009    Years since quitting: 8.9  . Smokeless tobacco: Never Used  Substance and Sexual Activity  . Alcohol use: No    Alcohol/week: 0.0 standard drinks  . Drug use: No  .  Sexual activity: Never  Lifestyle  . Physical activity:    Days per week: Not on file    Minutes per session: Not on file  . Stress: Not on file  Relationships  . Social connections:    Talks on phone: Not on file    Gets together: Not on file    Attends religious service: Not on file    Active member of club or organization: Not on file    Attends meetings of clubs or organizations: Not on file    Relationship status: Not on file  . Intimate partner violence:    Fear of current or ex partner: Not on file    Emotionally abused: Not on file    Physically abused: Not on file    Forced sexual activity: Not on file  Other Topics Concern  . Not on file  Social History Narrative   Lives in [place] with her husband.  Takes care of her two grandchildren, ages 11 and 2, everyday in [place].  Has one son who is 37.   Desires CPR   Would not want prolonged life support if futile    Past Surgical History:  Procedure Laterality Date  . ABDOMINAL HYSTERECTOMY    . APPENDECTOMY    .  BACK SURGERY    . CARPAL TUNNEL RELEASE Left   . CERVICAL FUSION    . CHOLECYSTECTOMY    . HEMORRHOID SURGERY    . LUMBAR FUSION    . TUBAL LIGATION      Family History  Problem Relation Age of Onset  . Heart disease Mother   . Cancer Father        lung/chest wall   . Prostate cancer Father   . Diabetes Brother        siblings  . Hypertension Sister        x 2  . Heart disease Brother   . Breast cancer Paternal Grandmother   . Heart disease Sister   . Diabetes Sister   . Tuberculosis Sister   . Cirrhosis Brother     Allergies  Allergen Reactions  . Prednisone Anaphylaxis, Hives, Shortness Of Breath, Itching and Swelling    The face swells  . Bisphosphonates Other (See Comments)    Unsure  . Prevnar 13 [Pneumococcal 13-Val Conj Vacc] Other (See Comments)    Reaction not recalled by the patient  . Food Rash    MANGO = cause rashes around the mouth    Current Outpatient Medications  on File Prior to Visit  Medication Sig Dispense Refill  . clobetasol cream (TEMOVATE) 7.34 % Apply 1 application topically 2 (two) times daily.    Marland Kitchen levothyroxine (SYNTHROID, LEVOTHROID) 75 MCG tablet TAKE 1 TABLET BY MOUTH EVERY MORNING ON EMPTY STOMACH WITH A FULL GLASS OF WATER. 90 tablet 3  . lisinopril (PRINIVIL,ZESTRIL) 5 MG tablet Take 1 tablet (5 mg total) by mouth daily. For blood pressure. 90 tablet 1  . oxymetazoline (AFRIN) 0.05 % nasal spray Place 1 spray into both nostrils 2 (two) times daily as needed for congestion (nose bleed). 30 mL 0  . pimecrolimus (ELIDEL) 1 % cream Apply topically 2 (two) times daily.    Marland Kitchen tiZANidine (ZANAFLEX) 4 MG tablet Take 1 tablet (4 mg total) by mouth every 8 (eight) hours as needed for muscle spasms. 15 tablet 0  . ALPRAZolam (XANAX) 0.25 MG tablet Take 1 tablet by mouth daily as needed for anxiety. (Patient not taking: Reported on 01/17/2018) 20 tablet 0   No current facility-administered medications on file prior to visit.     BP 126/82   Pulse 78   Temp 98.2 F (36.8 C) (Oral)   Ht 4\' 11"  (1.499 m)   Wt 176 lb (79.8 kg)   SpO2 92%   BMI 35.55 kg/m    Objective:   Physical Exam  Constitutional: She appears well-nourished. She does not appear ill.  HENT:  Right Ear: Tympanic membrane and ear canal normal.  Left Ear: Tympanic membrane and ear canal normal.  Nose: No mucosal edema. Right sinus exhibits no maxillary sinus tenderness and no frontal sinus tenderness. Left sinus exhibits no maxillary sinus tenderness and no frontal sinus tenderness.  Mouth/Throat: Oropharynx is clear and moist.  Neck: Neck supple.  Cardiovascular: Normal rate and regular rhythm.  Respiratory: Effort normal. She has no decreased breath sounds.  Mild rhonchi to bilateral upper fields  Skin: Skin is warm and dry.           Assessment & Plan:  URI:  Cough, post nasal drip, watery eyes, sore throat x 1 day. Exam today overall stable, does have some  rales to bilateral bases which is likely chronic from COPD.  She does appear acutely ill but is stable. Check xray today  given lung sounds. Rx for Cheratussin provided. Discussed use of antihistamine.  Fluids, rest, return precautions provided.  Pleas Koch, NP

## 2018-01-17 NOTE — Telephone Encounter (Signed)
Patient calls in to triage with fever of 100.1.  K. Clark, Np notified and will be responding.   Thanks.

## 2018-01-17 NOTE — Telephone Encounter (Signed)
Spoke with patient by phone and gave her all of Kate's instructions in this my chart message.  Also gave her [facility] number and told her to please call between [phone] for an appointment.  If she is not seen and continues to run fevers, to please call us Monday and we will get her in for follow up.  Patient verbalizes understanding.

## 2018-01-21 ENCOUNTER — Encounter: Payer: Self-pay | Admitting: Primary Care

## 2018-01-21 ENCOUNTER — Ambulatory Visit (INDEPENDENT_AMBULATORY_CARE_PROVIDER_SITE_OTHER): Payer: Medicare Other | Admitting: Primary Care

## 2018-01-21 VITALS — BP 124/86 | HR 106 | Temp 99.6°F | Ht 59.0 in | Wt 182.0 lb

## 2018-01-21 DIAGNOSIS — J441 Chronic obstructive pulmonary disease with (acute) exacerbation: Secondary | ICD-10-CM | POA: Diagnosis not present

## 2018-01-21 DIAGNOSIS — R509 Fever, unspecified: Secondary | ICD-10-CM

## 2018-01-21 HISTORY — DX: Chronic obstructive pulmonary disease with (acute) exacerbation: J44.1

## 2018-01-21 LAB — POC INFLUENZA A&B (BINAX/QUICKVUE)
Influenza A, POC: NEGATIVE
Influenza B, POC: NEGATIVE

## 2018-01-21 MED ORDER — ALBUTEROL SULFATE HFA 108 (90 BASE) MCG/ACT IN AERS: 2.0000 | INHALATION_SPRAY | RESPIRATORY_TRACT | 0 refills | Status: DC | PRN

## 2018-01-21 MED ORDER — IPRATROPIUM-ALBUTEROL 0.5-2.5 (3) MG/3ML IN SOLN
3.0000 mL | Freq: Once | RESPIRATORY_TRACT | Status: AC
  Administered 2018-01-21: 3 mL via RESPIRATORY_TRACT

## 2018-01-21 MED ORDER — DOXYCYCLINE HYCLATE 100 MG PO TABS: 100.0000 mg | ORAL_TABLET | Freq: Two times a day (BID) | ORAL | 0 refills | Status: DC

## 2018-01-21 NOTE — Assessment & Plan Note (Signed)
Fevers, cough, SOB, increased sputum production. Appears sick, stable for outpatient treatment. Duoneb provided in the office today, symptoms improved post treatment. Rapid Flu is negative today. Chest xray last week negative for pneumonia.  Treat for presumed COPD exacerbation.  Rx for Doxycycline course and albuterol inhaler sent to pharmacy. Return precautions provided.

## 2018-01-21 NOTE — Addendum Note (Signed)
Addended by: Jacqualin Combes on: 01/21/2018 10:14 AM   Modules accepted: Orders

## 2018-01-21 NOTE — Progress Notes (Signed)
Subjective:    Patient ID: Teresa Jensen, female    DOB: June 05, 1945, 73 y.o.   MRN: [id number]  HPI  Teresa Jensen is a 73 year old female with a history of COPD who presents today with a chief complaint of cough.  She was last evaluated on 01/17/18 with a 24 hour history of post nasal drip, chest congestion, voice hoarseness, watery eyes. Exam, including vital signs were consistent for viral URI so she was treated with conservative measures. Later that day she phoned in reporting fever of 100.1 so we provided her with information for the [facility] Saturday clinic.   Since her last visit she's not been evaluated. Her fever dissipated Saturday morning, returned later that day and has been running fevers since. Fevers are running 99-101. She has noticed increased cough, shortness of breath, increased mucous production. She's taking Tylenol and Robitussin AC without much improvement.   Review of Systems  Constitutional: Positive for fatigue and fever.  HENT: Positive for congestion and postnasal drip. Negative for sore throat.   Respiratory: Positive for cough and shortness of breath.   Cardiovascular: Negative for chest pain.       Past Medical History:  Diagnosis Date  . Anxiety   . COPD (chronic obstructive pulmonary disease) ([place])   . Epistaxis 01/09/2017  . GERD (gastroesophageal reflux disease)   . Hypertension   . New onset of headaches   . Osteopenia   . PMR (polymyalgia rheumatica) ([facility])   . Stasis dermatitis   . Strain of chest wall 01/20/2011  . Tobacco abuse      Social History   Socioeconomic History  . Marital status: Married    Spouse name: Not on file  . Number of children: 1  . Years of education: Not on file  . Highest education level: Not on file  Occupational History  . 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 Financial resource strain: Not on file  . Food insecurity:    Worry: Not on file    Inability: Not on file  . Transportation needs:    Medical: Not on file    Non-medical: Not on file  Tobacco Use  . Smoking status: Former Smoker    Packs/day: 1.50    Years: 40.00    Pack years: 60.00    Types: Cigarettes    Last attempt to quit: 02/11/2009    Years since quitting: 8.9  . Smokeless tobacco: Never Used  Substance and Sexual Activity  . Alcohol use: No    Alcohol/week: 0.0 standard drinks  . Drug use: No  . Sexual activity: Never  Lifestyle  . Physical activity:    Days per week: Not on file    Minutes per session: Not on file  . Stress: Not on file  Relationships  . Social connections:    Talks on phone: Not on file    Gets together: Not on file    Attends religious service: Not on file    Active member of club or organization: Not on file    Attends meetings of clubs or organizations: Not on file    Relationship status: Not on file  . Intimate partner violence:    Fear of current or ex partner: Not on file    Emotionally abused: Not on file    Physically abused: Not on file    Forced sexual activity: Not on file  Other  Topics Concern  . Not on file  Social History Narrative   Lives in [place] with her husband.  Takes care of her two grandchildren, ages 37 and 2, everyday in [place].  Has one son who is 51.   Desires CPR   Would not want prolonged life support if futile    Past Surgical History:  Procedure Laterality Date  . ABDOMINAL HYSTERECTOMY    . APPENDECTOMY    . BACK SURGERY    . CARPAL TUNNEL RELEASE Left   . CERVICAL FUSION    . CHOLECYSTECTOMY    . HEMORRHOID SURGERY    . LUMBAR FUSION    . TUBAL LIGATION      Family History  Problem Relation Age of Onset  . Heart disease Mother   . Cancer Father        lung/chest wall   . Prostate cancer Father   . Diabetes Brother        siblings  . Hypertension Sister        x 2  . Heart disease Brother   . Breast cancer Paternal Grandmother   . Heart disease Sister   . Diabetes  Sister   . Tuberculosis Sister   . Cirrhosis Brother     Allergies  Allergen Reactions  . Prednisone Anaphylaxis, Hives, Shortness Of Breath, Itching and Swelling    The face swells  . Bisphosphonates Other (See Comments)    Unsure  . Prevnar 13 [Pneumococcal 13-Val Conj Vacc] Other (See Comments)    Reaction not recalled by the patient  . Food Rash    MANGO = cause rashes around the mouth    Current Outpatient Medications on File Prior to Visit  Medication Sig Dispense Refill  . ALPRAZolam (XANAX) 0.25 MG tablet Take 1 tablet by mouth daily as needed for anxiety. 20 tablet 0  . clobetasol cream (TEMOVATE) 0.45 % Apply 1 application topically 2 (two) times daily.    Marland Kitchen guaiFENesin-codeine (ROBITUSSIN AC) 100-10 MG/5ML syrup Take 5 mLs by mouth 3 (three) times daily as needed for cough. 75 mL 0  . levothyroxine (SYNTHROID, LEVOTHROID) 75 MCG tablet TAKE 1 TABLET BY MOUTH EVERY MORNING ON EMPTY STOMACH WITH A FULL GLASS OF WATER. 90 tablet 3  . lisinopril (PRINIVIL,ZESTRIL) 5 MG tablet Take 1 tablet (5 mg total) by mouth daily. For blood pressure. 90 tablet 1  . oxymetazoline (AFRIN) 0.05 % nasal spray Place 1 spray into both nostrils 2 (two) times daily as needed for congestion (nose bleed). 30 mL 0  . pimecrolimus (ELIDEL) 1 % cream Apply topically 2 (two) times daily.    Marland Kitchen tiZANidine (ZANAFLEX) 4 MG tablet Take 1 tablet (4 mg total) by mouth every 8 (eight) hours as needed for muscle spasms. 15 tablet 0   No current facility-administered medications on file prior to visit.     BP 124/86   Pulse (!) 106   Temp 99.6 F (37.6 C) (Oral)   Ht 4\' 11"  (1.499 m)   Wt 182 lb (82.6 kg)   SpO2 (!) 89%   BMI 36.76 kg/m    Objective:   Physical Exam  Constitutional: She appears well-nourished. She appears ill.  HENT:  Right Ear: Tympanic membrane and ear canal normal.  Left Ear: Tympanic membrane and ear canal normal.  Nose: No mucosal edema. Right sinus exhibits no maxillary sinus  tenderness and no frontal sinus tenderness. Left sinus exhibits no maxillary sinus tenderness and no frontal sinus tenderness.  Mouth/Throat:  Oropharynx is clear and moist.  Neck: Neck supple.  Cardiovascular: Normal rate and regular rhythm.  Respiratory: Effort normal. She has wheezes in the left upper field and the left lower field. She has rhonchi in the right lower field and the left lower field.  Skin: Skin is warm and dry.           Assessment & Plan:

## 2018-01-21 NOTE — Patient Instructions (Signed)
Start Doxycycline antibiotic for the infection. Take 1 tablet by mouth twice daily for 7 days.  Shortness of Breath/Wheezing/Cough: Use the albuterol inhaler. Inhale 2 puffs into the lungs every 4 to 6 hours as needed for wheezing, cough, and/or shortness of breath.   You can use the cough medication as needed.  It was a pleasure to see you today!

## 2018-02-05 ENCOUNTER — Ambulatory Visit (INDEPENDENT_AMBULATORY_CARE_PROVIDER_SITE_OTHER): Payer: Medicare Other | Admitting: Primary Care

## 2018-02-05 ENCOUNTER — Encounter: Payer: Self-pay | Admitting: Primary Care

## 2018-02-05 VITALS — BP 122/82 | HR 86 | Temp 98.3°F | Ht 59.0 in | Wt 182.0 lb

## 2018-02-05 DIAGNOSIS — R053 Chronic cough: Secondary | ICD-10-CM

## 2018-02-05 DIAGNOSIS — R05 Cough: Secondary | ICD-10-CM

## 2018-02-05 NOTE — Assessment & Plan Note (Signed)
Occurring for the last 2-3 months, worse over last month. Exam today with clear lungs, doesn't appear sickly. Differentials include post infectious cough, GERD, Ace induced cough, COPD. Will start by holding lisinopril x 2 weeks. She will monitor BP and symptoms, she will provide [place] with an update in 2 weeks. Consider switching to losartan if cough resolves with removal of ACE.

## 2018-02-05 NOTE — Patient Instructions (Signed)
Hold your lisinopril 5 mg tablets for blood pressure for two weeks.  Monitor your blood pressure and notify me if you see readings at or above 140/90.  Please send me an update via My Chart in 2 weeks as discussed.  It was a pleasure to see you today!

## 2018-02-05 NOTE — Progress Notes (Signed)
Subjective:    Patient ID: Teresa Jensen, female    DOB: [birth year], 73 y.o.   MRN: [id number]  HPI  Teresa Jensen is a 73 year old female with a history of COPD, hypothyroidism, hypertension managed on ACE who presents today with a chief complaint of cough.  She endorses a cough for which she notices mostly at night when laying flat and early in the morning, does have a cough during the day. Her cough is non productive, feels like there's something stuck in her throat vs tickle to the throat. Cough began 2-3 months ago, worse over the last one month. She was treated for COPD exacerbation on 01/21/18, feels recovered from that.   She's tried taking cough medication with codeine without improvement. She denies fevers, esophageal burning, epigastric pain, shortness of breath , belching, throat fullness, rhinorrhea, watery eyes.  She is not checking her BP at home. She is compliant to her lisinopril daily.   BP Readings from Last 3 Encounters:  02/05/18 122/82  01/21/18 124/86  01/17/18 126/82     Review of Systems  Constitutional: Negative for chills and fever.  HENT: Positive for rhinorrhea. Negative for congestion and sore throat.   Respiratory: Positive for cough. Negative for shortness of breath.   Cardiovascular: Negative for chest pain.       Past Medical History:  Diagnosis Date  . Anxiety   . COPD (chronic obstructive pulmonary disease) ([place])   . Epistaxis 01/09/2017  . GERD (gastroesophageal reflux disease)   . Hypertension   . New onset of headaches   . Osteopenia   . PMR (polymyalgia rheumatica) ([facility])   . Stasis dermatitis   . Strain of chest wall 01/20/2011  . Tobacco abuse      Social History   Socioeconomic History  . Marital status: Married    Spouse name: Not on file  . Number of children: 1  . Years of education: Not on file  . Highest education level: Not on file  Occupational History  . 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 Financial resource strain: Not on file  . Food insecurity:    Worry: Not on file    Inability: Not on file  . Transportation needs:    Medical: Not on file    Non-medical: Not on file  Tobacco Use  . Smoking status: Former Smoker    Packs/day: 1.50    Years: 40.00    Pack years: 60.00    Types: Cigarettes    Last attempt to quit: 02/11/2009    Years since quitting: 8.9  . Smokeless tobacco: Never Used  Substance and Sexual Activity  . Alcohol use: No    Alcohol/week: 0.0 standard drinks  . Drug use: No  . Sexual activity: Never  Lifestyle  . Physical activity:    Days per week: Not on file    Minutes per session: Not on file  . Stress: Not on file  Relationships  . Social connections:    Talks on phone: Not on file    Gets together: Not on file    Attends religious service: Not on file    Active member of club or organization: Not on file    Attends meetings of clubs or organizations: Not on file    Relationship status: Not on file  . Intimate partner violence:    Fear of current or ex partner: Not  on file    Emotionally abused: Not on file    Physically abused: Not on file    Forced sexual activity: Not on file  Other Topics Concern  . Not on file  Social History Narrative   Lives in [place] with her husband.  Takes care of her two grandchildren, ages 48 and 2, everyday in [place].  Has one son who is 109.   Desires CPR   Would not want prolonged life support if futile    Past Surgical History:  Procedure Laterality Date  . ABDOMINAL HYSTERECTOMY    . APPENDECTOMY    . BACK SURGERY    . CARPAL TUNNEL RELEASE Left   . CERVICAL FUSION    . CHOLECYSTECTOMY    . HEMORRHOID SURGERY    . LUMBAR FUSION    . TUBAL LIGATION      Family History  Problem Relation Age of Onset  . Heart disease Mother   . Cancer Father        lung/chest wall   . Prostate cancer Father   . Diabetes Brother        siblings  .  Hypertension Sister        x 2  . Heart disease Brother   . Breast cancer Paternal Grandmother   . Heart disease Sister   . Diabetes Sister   . Tuberculosis Sister   . Cirrhosis Brother     Allergies  Allergen Reactions  . Prednisone Anaphylaxis, Hives, Shortness Of Breath, Itching and Swelling    The face swells  . Bisphosphonates Other (See Comments)    Unsure  . Prevnar 13 [Pneumococcal 13-Val Conj Vacc] Other (See Comments)    Reaction not recalled by the patient  . Food Rash    MANGO = cause rashes around the mouth    Current Outpatient Medications on File Prior to Visit  Medication Sig Dispense Refill  . albuterol (PROVENTIL HFA;VENTOLIN HFA) 108 (90 Base) MCG/ACT inhaler Inhale 2 puffs into the lungs every 4 (four) hours as needed for shortness of breath. 1 Inhaler 0  . ALPRAZolam (XANAX) 0.25 MG tablet Take 1 tablet by mouth daily as needed for anxiety. 20 tablet 0  . clobetasol cream (TEMOVATE) 4.17 % Apply 1 application topically 2 (two) times daily.    Marland Kitchen levothyroxine (SYNTHROID, LEVOTHROID) 75 MCG tablet TAKE 1 TABLET BY MOUTH EVERY MORNING ON EMPTY STOMACH WITH A FULL GLASS OF WATER. 90 tablet 3  . lisinopril (PRINIVIL,ZESTRIL) 5 MG tablet Take 1 tablet (5 mg total) by mouth daily. For blood pressure. 90 tablet 1   No current facility-administered medications on file prior to visit.     BP 122/82   Pulse 86   Temp 98.3 F (36.8 C) (Oral)   Ht 4\' 11"  (1.499 m)   Wt 182 lb (82.6 kg)   SpO2 98%   BMI 36.76 kg/m    Objective:   Physical Exam  Constitutional: She appears well-nourished. She does not appear ill.  HENT:  Right Ear: Tympanic membrane and ear canal normal.  Left Ear: Tympanic membrane and ear canal normal.  Nose: No mucosal edema. Right sinus exhibits no maxillary sinus tenderness and no frontal sinus tenderness. Left sinus exhibits no maxillary sinus tenderness and no frontal sinus tenderness.  Mouth/Throat: Oropharynx is clear and moist.    Neck: Neck supple.  Cardiovascular: Normal rate and regular rhythm.  Respiratory: Effort normal and breath sounds normal. She has no wheezes.  Skin: Skin is warm  and dry.           Assessment & Plan:

## 2018-02-10 ENCOUNTER — Ambulatory Visit (INDEPENDENT_AMBULATORY_CARE_PROVIDER_SITE_OTHER): Payer: Medicare Other | Admitting: Primary Care

## 2018-02-10 ENCOUNTER — Ambulatory Visit (INDEPENDENT_AMBULATORY_CARE_PROVIDER_SITE_OTHER)
Admission: RE | Admit: 2018-02-10 | Discharge: 2018-02-10 | Disposition: A | Discharge status: Home / Self Care | Payer: Medicare Other | Source: Ambulatory Visit | Attending: Primary Care | Admitting: Primary Care

## 2018-02-10 ENCOUNTER — Encounter: Payer: Self-pay | Admitting: Primary Care

## 2018-02-10 VITALS — BP 122/80 | HR 124 | Temp 101.0°F | Ht 59.0 in | Wt 181.2 lb

## 2018-02-10 DIAGNOSIS — R05 Cough: Secondary | ICD-10-CM | POA: Diagnosis not present

## 2018-02-10 DIAGNOSIS — J181 Lobar pneumonia, unspecified organism: Secondary | ICD-10-CM | POA: Diagnosis not present

## 2018-02-10 DIAGNOSIS — R509 Fever, unspecified: Secondary | ICD-10-CM | POA: Diagnosis not present

## 2018-02-10 DIAGNOSIS — R059 Cough, unspecified: Secondary | ICD-10-CM

## 2018-02-10 DIAGNOSIS — J189 Pneumonia, unspecified organism: Secondary | ICD-10-CM

## 2018-02-10 LAB — POC INFLUENZA A&B (BINAX/QUICKVUE)
Influenza A, POC: NEGATIVE
Influenza B, POC: NEGATIVE

## 2018-02-10 MED ORDER — DOXYCYCLINE HYCLATE 100 MG PO TABS: 100.0000 mg | ORAL_TABLET | Freq: Two times a day (BID) | ORAL | 0 refills | Status: DC

## 2018-02-10 NOTE — Patient Instructions (Addendum)
Start Doxycycline antibiotic for the infection. Take 1 tablet by mouth twice daily for 10 days.  You can take Tylenol or Ibuprofen for fevers/body aches.   Go to the hospital if you feel worse, you have trouble breathing, you continue to run fevers.  It was a pleasure to see you today!

## 2018-02-10 NOTE — Progress Notes (Signed)
Subjective:    Patient ID: Teresa Jensen, female    DOB: [birth year], 73 y.o.   MRN: [id number]  HPI  Teresa Jensen is a 73 year old female with a history of COPD, hypertension, GERD who presents today with a chief complaint of fever.  She also reports body aches, nasal congestion, cough. Her symptoms began 2-3 days ago. Her temperature has been running 100-101.4. She's been taking Tylenol with her last dose being 2 hours ago. Her cough is productive with green/yellow sputum. She denies sick contacts, was in the grocery store late last week.   Review of Systems  Constitutional: Positive for appetite change, chills, fatigue and fever.  HENT: Positive for congestion.   Respiratory: Positive for cough. Negative for wheezing.   Cardiovascular: Negative for chest pain.       Past Medical History:  Diagnosis Date  . Anxiety   . COPD (chronic obstructive pulmonary disease) ([place])   . Epistaxis 01/09/2017  . GERD (gastroesophageal reflux disease)   . Hypertension   . New onset of headaches   . Osteopenia   . PMR (polymyalgia rheumatica) ([facility])   . Stasis dermatitis   . Strain of chest wall 01/20/2011  . Tobacco abuse      Social History   Socioeconomic History  . Marital status: Married    Spouse name: Not on file  . Number of children: 1  . Years of education: Not on file  . Highest education level: Not on file  Occupational History  . 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 Financial resource strain: Not on file  . Food insecurity:    Worry: Not on file    Inability: Not on file  . Transportation needs:    Medical: Not on file    Non-medical: Not on file  Tobacco Use  . Smoking status: Former Smoker    Packs/day: 1.50    Years: 40.00    Pack years: 60.00    Types: Cigarettes    Last attempt to quit: 02/11/2009    Years since quitting: 9.0  . Smokeless tobacco: Never Used  Substance and Sexual Activity   . Alcohol use: No    Alcohol/week: 0.0 standard drinks  . Drug use: No  . Sexual activity: Never  Lifestyle  . Physical activity:    Days per week: Not on file    Minutes per session: Not on file  . Stress: Not on file  Relationships  . Social connections:    Talks on phone: Not on file    Gets together: Not on file    Attends religious service: Not on file    Active member of club or organization: Not on file    Attends meetings of clubs or organizations: Not on file    Relationship status: Not on file  . Intimate partner violence:    Fear of current or ex partner: Not on file    Emotionally abused: Not on file    Physically abused: Not on file    Forced sexual activity: Not on file  Other Topics Concern  . Not on file  Social History Narrative   Lives in [place] with her husband.  Takes care of her two grandchildren, ages 55 and 2, everyday in [place].  Has one son who is 54.   Desires CPR   Would not want prolonged life support if futile  Past Surgical History:  Procedure Laterality Date  . ABDOMINAL HYSTERECTOMY    . APPENDECTOMY    . BACK SURGERY    . CARPAL TUNNEL RELEASE Left   . CERVICAL FUSION    . CHOLECYSTECTOMY    . HEMORRHOID SURGERY    . LUMBAR FUSION    . TUBAL LIGATION      Family History  Problem Relation Age of Onset  . Heart disease Mother   . Cancer Father        lung/chest wall   . Prostate cancer Father   . Diabetes Brother        siblings  . Hypertension Sister        x 2  . Heart disease Brother   . Breast cancer Paternal Grandmother   . Heart disease Sister   . Diabetes Sister   . Tuberculosis Sister   . Cirrhosis Brother     Allergies  Allergen Reactions  . Prednisone Anaphylaxis, Hives, Shortness Of Breath, Itching and Swelling    The face swells  . Bisphosphonates Other (See Comments)    Unsure  . Prevnar 13 [Pneumococcal 13-Val Conj Vacc] Other (See Comments)    Reaction not recalled by the patient  . Food Rash      MANGO = cause rashes around the mouth    Current Outpatient Medications on File Prior to Visit  Medication Sig Dispense Refill  . albuterol (PROVENTIL HFA;VENTOLIN HFA) 108 (90 Base) MCG/ACT inhaler Inhale 2 puffs into the lungs every 4 (four) hours as needed for shortness of breath. 1 Inhaler 0  . ALPRAZolam (XANAX) 0.25 MG tablet Take 1 tablet by mouth daily as needed for anxiety. 20 tablet 0  . clobetasol cream (TEMOVATE) 3.87 % Apply 1 application topically 2 (two) times daily.    Marland Kitchen levothyroxine (SYNTHROID, LEVOTHROID) 75 MCG tablet TAKE 1 TABLET BY MOUTH EVERY MORNING ON EMPTY STOMACH WITH A FULL GLASS OF WATER. 90 tablet 3  . lisinopril (PRINIVIL,ZESTRIL) 5 MG tablet Take 1 tablet (5 mg total) by mouth daily. For blood pressure. (Patient not taking: Reported on 02/10/2018) 90 tablet 1   No current facility-administered medications on file prior to visit.     BP 122/80   Pulse (!) 124   Temp (!) 101 F (38.3 C) (Oral)   Ht 4\' 11"  (1.499 m)   Wt 181 lb 4 oz (82.2 kg)   SpO2 98%   BMI 36.61 kg/m    Objective:   Physical Exam  Constitutional: She appears well-nourished. She appears ill.  HENT:  Right Ear: Tympanic membrane and ear canal normal.  Left Ear: Tympanic membrane and ear canal normal.  Nose: Mucosal edema present. Right sinus exhibits no maxillary sinus tenderness and no frontal sinus tenderness. Left sinus exhibits no maxillary sinus tenderness and no frontal sinus tenderness.  Mouth/Throat: Oropharynx is clear and moist.  Neck: Neck supple.  Cardiovascular: Regular rhythm.  Sinus tachycardia   Respiratory: Effort normal. She has no decreased breath sounds. She has no wheezes. She has rhonchi in the right upper field, the right lower field, the left upper field and the left lower field. She has rales in the right lower field.  Congested cough during visit, green/thick sputum noted on tissue   Skin: Skin is warm and dry.           Assessment & Plan:   CAP:  Also with fever, tachycardia.  Appears ill. Lung sounds questionable for pneumonia vs bronchitis. Rapid flu negative. Chest  xray today with right upper lobe pneumonia.  Rx for Doxycycline sent to pharmacy.  Discussed use of Tylenol or Ibuprofen, fluids, rest. Strict hospital precautions provided.  Pleas Koch, NP

## 2018-02-11 ENCOUNTER — Ambulatory Visit: Payer: Medicare Other | Admitting: Primary Care

## 2018-02-12 DIAGNOSIS — J181 Lobar pneumonia, unspecified organism: Principal | ICD-10-CM

## 2018-02-12 DIAGNOSIS — J189 Pneumonia, unspecified organism: Secondary | ICD-10-CM

## 2018-02-12 MED ORDER — AMOXICILLIN-POT CLAVULANATE 875-125 MG PO TABS: 1.0000 | ORAL_TABLET | Freq: Two times a day (BID) | ORAL | 0 refills | Status: DC

## 2018-02-13 ENCOUNTER — Encounter: Payer: Self-pay | Admitting: Emergency Medicine

## 2018-02-13 ENCOUNTER — Inpatient Hospital Stay
Admission: EM | Admit: 2018-02-13 | Discharge: 2018-02-17 | DRG: 193 | Disposition: A | Discharge status: Home / Self Care | Payer: Medicare Other | Attending: Internal Medicine | Admitting: Internal Medicine

## 2018-02-13 ENCOUNTER — Other Ambulatory Visit: Payer: Self-pay

## 2018-02-13 ENCOUNTER — Emergency Department: Payer: Medicare Other

## 2018-02-13 DIAGNOSIS — I110 Hypertensive heart disease with heart failure: Secondary | ICD-10-CM | POA: Diagnosis present

## 2018-02-13 DIAGNOSIS — I5033 Acute on chronic diastolic (congestive) heart failure: Secondary | ICD-10-CM | POA: Diagnosis present

## 2018-02-13 DIAGNOSIS — E876 Hypokalemia: Secondary | ICD-10-CM | POA: Diagnosis present

## 2018-02-13 DIAGNOSIS — Z833 Family history of diabetes mellitus: Secondary | ICD-10-CM | POA: Diagnosis not present

## 2018-02-13 DIAGNOSIS — Z887 Allergy status to serum and vaccine status: Secondary | ICD-10-CM

## 2018-02-13 DIAGNOSIS — Z888 Allergy status to other drugs, medicaments and biological substances status: Secondary | ICD-10-CM

## 2018-02-13 DIAGNOSIS — J189 Pneumonia, unspecified organism: Secondary | ICD-10-CM

## 2018-02-13 DIAGNOSIS — J9601 Acute respiratory failure with hypoxia: Secondary | ICD-10-CM | POA: Diagnosis present

## 2018-02-13 DIAGNOSIS — M810 Age-related osteoporosis without current pathological fracture: Secondary | ICD-10-CM | POA: Diagnosis present

## 2018-02-13 DIAGNOSIS — M353 Polymyalgia rheumatica: Secondary | ICD-10-CM | POA: Diagnosis present

## 2018-02-13 DIAGNOSIS — J441 Chronic obstructive pulmonary disease with (acute) exacerbation: Secondary | ICD-10-CM | POA: Diagnosis present

## 2018-02-13 DIAGNOSIS — Z831 Family history of other infectious and parasitic diseases: Secondary | ICD-10-CM

## 2018-02-13 DIAGNOSIS — E039 Hypothyroidism, unspecified: Secondary | ICD-10-CM | POA: Diagnosis present

## 2018-02-13 DIAGNOSIS — I340 Nonrheumatic mitral (valve) insufficiency: Secondary | ICD-10-CM | POA: Diagnosis not present

## 2018-02-13 DIAGNOSIS — K219 Gastro-esophageal reflux disease without esophagitis: Secondary | ICD-10-CM | POA: Diagnosis present

## 2018-02-13 DIAGNOSIS — Z79890 Hormone replacement therapy (postmenopausal): Secondary | ICD-10-CM | POA: Diagnosis not present

## 2018-02-13 DIAGNOSIS — M1990 Unspecified osteoarthritis, unspecified site: Secondary | ICD-10-CM | POA: Diagnosis present

## 2018-02-13 DIAGNOSIS — R7303 Prediabetes: Secondary | ICD-10-CM | POA: Diagnosis present

## 2018-02-13 DIAGNOSIS — F419 Anxiety disorder, unspecified: Secondary | ICD-10-CM | POA: Diagnosis present

## 2018-02-13 DIAGNOSIS — Z8042 Family history of malignant neoplasm of prostate: Secondary | ICD-10-CM | POA: Diagnosis not present

## 2018-02-13 DIAGNOSIS — M8580 Other specified disorders of bone density and structure, unspecified site: Secondary | ICD-10-CM | POA: Diagnosis present

## 2018-02-13 DIAGNOSIS — Z8249 Family history of ischemic heart disease and other diseases of the circulatory system: Secondary | ICD-10-CM | POA: Diagnosis not present

## 2018-02-13 DIAGNOSIS — R0601 Orthopnea: Secondary | ICD-10-CM

## 2018-02-13 DIAGNOSIS — Z87891 Personal history of nicotine dependence: Secondary | ICD-10-CM | POA: Diagnosis not present

## 2018-02-13 DIAGNOSIS — J440 Chronic obstructive pulmonary disease with acute lower respiratory infection: Secondary | ICD-10-CM | POA: Diagnosis present

## 2018-02-13 DIAGNOSIS — Z803 Family history of malignant neoplasm of breast: Secondary | ICD-10-CM

## 2018-02-13 DIAGNOSIS — Z91018 Allergy to other foods: Secondary | ICD-10-CM | POA: Diagnosis not present

## 2018-02-13 HISTORY — DX: Pneumonia, unspecified organism: J18.9

## 2018-02-13 LAB — URINALYSIS, ROUTINE W REFLEX MICROSCOPIC
Bilirubin Urine: NEGATIVE
Glucose, UA: NEGATIVE mg/dL
Hgb urine dipstick: NEGATIVE
Ketones, ur: NEGATIVE mg/dL
Leukocytes,Ua: NEGATIVE
Nitrite: NEGATIVE
Protein, ur: NEGATIVE mg/dL
Specific Gravity, Urine: 1.008 (ref 1.005–1.030)
pH: 7 (ref 5.0–8.0)

## 2018-02-13 LAB — COMPREHENSIVE METABOLIC PANEL
ALT: 28 U/L (ref 0–44)
AST: 42 U/L — ABNORMAL HIGH (ref 15–41)
Albumin: 2.7 g/dL — ABNORMAL LOW (ref 3.5–5.0)
Alkaline Phosphatase: 68 U/L (ref 38–126)
Anion gap: 8 (ref 5–15)
BUN: 8 mg/dL (ref 8–23)
CO2: 33 mmol/L — ABNORMAL HIGH (ref 22–32)
Calcium: 7.9 mg/dL — ABNORMAL LOW (ref 8.9–10.3)
Chloride: 96 mmol/L — ABNORMAL LOW (ref 98–111)
Creatinine, Ser: 0.48 mg/dL (ref 0.44–1.00)
GFR calc Af Amer: >60 mL/min (ref >60)
GFR calc non Af Amer: >60 mL/min (ref >60)
Glucose, Bld: 136 mg/dL — ABNORMAL HIGH (ref 70–99)
Potassium: 2.9 mmol/L — ABNORMAL LOW (ref 3.5–5.1)
Sodium: 137 mmol/L (ref 135–145)
Total Bilirubin: 0.8 mg/dL (ref 0.3–1.2)
Total Protein: 7.6 g/dL (ref 6.5–8.1)

## 2018-02-13 LAB — CBC WITH DIFFERENTIAL/PLATELET
Abs Immature Granulocytes: 0.11 10*3/uL — ABNORMAL HIGH (ref 0.00–0.07)
Basophils Absolute: 0.1 10*3/uL (ref 0.0–0.1)
Basophils Relative: 0 %
Eosinophils Absolute: 0 10*3/uL (ref 0.0–0.5)
Eosinophils Relative: 0 %
HCT: 40.5 % (ref 36.0–46.0)
Hemoglobin: 12.5 g/dL (ref 12.0–15.0)
Immature Granulocytes: 1 %
Lymphocytes Relative: 16 %
Lymphs Abs: 2 10*3/uL (ref 0.7–4.0)
MCH: 28.7 pg (ref 26.0–34.0)
MCHC: 30.9 g/dL (ref 30.0–36.0)
MCV: 92.9 fL (ref 80.0–100.0)
Monocytes Absolute: 1.6 10*3/uL — ABNORMAL HIGH (ref 0.1–1.0)
Monocytes Relative: 13 %
Neutro Abs: 8.5 10*3/uL — ABNORMAL HIGH (ref 1.7–7.7)
Neutrophils Relative %: 70 %
Platelets: 377 10*3/uL (ref 150–400)
RBC: 4.36 MIL/uL (ref 3.87–5.11)
RDW: 14.9 % (ref 11.5–15.5)
WBC: 12.3 10*3/uL — ABNORMAL HIGH (ref 4.0–10.5)
nRBC: 0.3 % — ABNORMAL HIGH (ref 0.0–0.2)

## 2018-02-13 LAB — LACTIC ACID, PLASMA: Lactic Acid, Venous: 1.2 mmol/L (ref 0.5–1.9)

## 2018-02-13 MED ORDER — POLYETHYLENE GLYCOL 3350 17 G PO PACK: 17.0000 g | PACK | Freq: Every day | ORAL | Status: DC | PRN

## 2018-02-13 MED ORDER — SODIUM CHLORIDE 0.9 % IV SOLN
1.0000 g | INTRAVENOUS | Status: DC
  Administered 2018-02-14 – 2018-02-16 (×3): 1 g via INTRAVENOUS
  Filled 2018-02-13: qty 10
  Filled 2018-02-13 (×3): qty 1

## 2018-02-13 MED ORDER — POTASSIUM CHLORIDE CRYS ER 20 MEQ PO TBCR
40.0000 meq | EXTENDED_RELEASE_TABLET | ORAL | Status: AC
  Administered 2018-02-13 (×2): 40 meq via ORAL
  Filled 2018-02-13 (×2): qty 2

## 2018-02-13 MED ORDER — ALPRAZOLAM 0.25 MG PO TABS: 0.2500 mg | ORAL_TABLET | Freq: Every day | ORAL | Status: DC | PRN

## 2018-02-13 MED ORDER — LEVOTHYROXINE SODIUM 50 MCG PO TABS
75.0000 ug | ORAL_TABLET | Freq: Every day | ORAL | Status: DC
  Administered 2018-02-14 – 2018-02-17 (×4): 75 ug via ORAL
  Filled 2018-02-13 (×4): qty 1

## 2018-02-13 MED ORDER — ONDANSETRON HCL 4 MG/2ML IJ SOLN: 4.0000 mg | Freq: Four times a day (QID) | INTRAMUSCULAR | Status: DC | PRN

## 2018-02-13 MED ORDER — ACETAMINOPHEN 325 MG PO TABS: 650.0000 mg | ORAL_TABLET | Freq: Four times a day (QID) | ORAL | Status: DC | PRN

## 2018-02-13 MED ORDER — SODIUM CHLORIDE 0.9 % IV SOLN
500.0000 mg | Freq: Once | INTRAVENOUS | Status: AC
  Administered 2018-02-13: 500 mg via INTRAVENOUS
  Filled 2018-02-13: qty 500

## 2018-02-13 MED ORDER — SODIUM CHLORIDE 0.9 % IV SOLN
250.0000 mL | INTRAVENOUS | Status: DC | PRN
  Administered 2018-02-14 – 2018-02-16 (×3): 250 mL via INTRAVENOUS

## 2018-02-13 MED ORDER — SODIUM CHLORIDE 0.9 % IV SOLN
1.0000 g | Freq: Once | INTRAVENOUS | Status: AC
  Administered 2018-02-13: 1 g via INTRAVENOUS
  Filled 2018-02-13: qty 10

## 2018-02-13 MED ORDER — SODIUM CHLORIDE 0.9% FLUSH: 3.0000 mL | INTRAVENOUS | Status: DC | PRN

## 2018-02-13 MED ORDER — SODIUM CHLORIDE 0.9% FLUSH
3.0000 mL | Freq: Two times a day (BID) | INTRAVENOUS | Status: DC
  Administered 2018-02-13 – 2018-02-17 (×7): 3 mL via INTRAVENOUS

## 2018-02-13 MED ORDER — IPRATROPIUM-ALBUTEROL 0.5-2.5 (3) MG/3ML IN SOLN
3.0000 mL | Freq: Four times a day (QID) | RESPIRATORY_TRACT | Status: DC
  Administered 2018-02-13 – 2018-02-15 (×7): 3 mL via RESPIRATORY_TRACT
  Filled 2018-02-13 (×9): qty 3

## 2018-02-13 MED ORDER — AZITHROMYCIN 500 MG PO TABS
500.0000 mg | ORAL_TABLET | Freq: Every day | ORAL | Status: DC
  Administered 2018-02-14 – 2018-02-16 (×3): 500 mg via ORAL
  Filled 2018-02-13 (×3): qty 1

## 2018-02-13 MED ORDER — IPRATROPIUM-ALBUTEROL 0.5-2.5 (3) MG/3ML IN SOLN: 3.0000 mL | Freq: Four times a day (QID) | RESPIRATORY_TRACT | Status: DC

## 2018-02-13 MED ORDER — ENOXAPARIN SODIUM 40 MG/0.4ML ~~LOC~~ SOLN
40.0000 mg | SUBCUTANEOUS | Status: DC
  Administered 2018-02-13 – 2018-02-16 (×4): 40 mg via SUBCUTANEOUS
  Filled 2018-02-13 (×4): qty 0.4

## 2018-02-13 MED ORDER — ALBUTEROL SULFATE (2.5 MG/3ML) 0.083% IN NEBU: 2.5000 mg | INHALATION_SOLUTION | RESPIRATORY_TRACT | Status: DC | PRN

## 2018-02-13 MED ORDER — METHYLPREDNISOLONE SODIUM SUCC 40 MG IJ SOLR
40.0000 mg | Freq: Every day | INTRAMUSCULAR | Status: DC
  Administered 2018-02-14: 09:00:00 40 mg via INTRAVENOUS
  Filled 2018-02-13: qty 1

## 2018-02-13 MED ORDER — ONDANSETRON HCL 4 MG PO TABS: 4.0000 mg | ORAL_TABLET | Freq: Four times a day (QID) | ORAL | Status: DC | PRN

## 2018-02-13 MED ORDER — ACETAMINOPHEN 650 MG RE SUPP: 650.0000 mg | Freq: Four times a day (QID) | RECTAL | Status: DC | PRN

## 2018-02-13 NOTE — ED Provider Notes (Signed)
[facility] Emergency Department Provider Note   ____________________________________________   I have reviewed the triage vital signs and the nursing notes.   HISTORY  Chief Complaint Pneumonia  History limited by: Not Limited   HPI Teresa Jensen is a 73 y.o. female who presents to the emergency department today because of concerns for pneumonia.  The patient states that she has been feeling sick for about a week.  She has had fevers and cough.  The cough has been productive of green and yellow phlegm.  She went to her primary care doctor who did diagnosed with pneumonia off of an x-ray.  Initially start doxycycline however patient could not tolerate GI side effects and was switched to amoxicillin.  She states she has been on this for 2 days without any.  She states she was told if things did not get better to present to the emergency department.  Patient has a history of COPD and has tried her inhaler with some minimal relief.   Per medical record review patient has a history of recent diagnosis of CAP.  Past Medical History:  Diagnosis Date  . Anxiety   . CAP (community acquired pneumonia)   . COPD (chronic obstructive pulmonary disease) ([place])   . Epistaxis 01/09/2017  . GERD (gastroesophageal reflux disease)   . Hypertension   . New onset of headaches   . Osteopenia   . PMR (polymyalgia rheumatica) ([facility])   . Stasis dermatitis   . Strain of chest wall 01/20/2011  . Tobacco abuse     Patient Active Problem List   Diagnosis Date Noted  . COPD exacerbation ([place]) 01/21/2018  . Osteoporosis 12/16/2017  . Back pain 06/06/2017  . Low vitamin B12 level 01/13/2017  . Iatrogenic hypotension 01/12/2017  . Acute kidney failure ([place]) 01/12/2017  . Anemia 01/12/2017  . Essential hypertension 01/08/2017  . Epistaxis 01/07/2017  . Prediabetes 10/17/2015  . Herpes zoster 07/08/2015  . Pain of right lower extremity 05/20/2015  . Medicare annual wellness  visit, subsequent 10/12/2014  . Occipital neuralgia 10/12/2014  . Vitamin D deficiency 10/12/2014  . Erythema of lower extremity 09/08/2013  . Persistent cough 06/14/2010  . COPD (chronic obstructive pulmonary disease) ([place]) 06/05/2010  . Hypothyroidism 09/29/2009  . Situational anxiety 10/01/2008  . GERD 10/01/2008  . Osteoarthritis 10/01/2008    Past Surgical History:  Procedure Laterality Date  . ABDOMINAL HYSTERECTOMY    . APPENDECTOMY    . BACK SURGERY    . CARPAL TUNNEL RELEASE Left   . CERVICAL FUSION    . CHOLECYSTECTOMY    . HEMORRHOID SURGERY    . LUMBAR FUSION    . TUBAL LIGATION      Prior to Admission medications   Medication Sig Start Date End Date Taking? Authorizing Provider  albuterol (PROVENTIL HFA;VENTOLIN HFA) 108 (90 Base) MCG/ACT inhaler Inhale 2 puffs into the lungs every 4 (four) hours as needed for shortness of breath. 01/21/18   Pleas Koch, NP  ALPRAZolam Duanne Moron) 0.25 MG tablet Take 1 tablet by mouth daily as needed for anxiety. 10/17/17   Pleas Koch, NP  amoxicillin-clavulanate (AUGMENTIN) 875-125 MG tablet Take 1 tablet by mouth 2 (two) times daily. 02/12/18   Pleas Koch, NP  clobetasol cream (TEMOVATE) 2.99 % Apply 1 application topically 2 (two) times daily.    [provider]  levothyroxine (SYNTHROID, LEVOTHROID) 75 MCG tablet TAKE 1 TABLET BY MOUTH EVERY MORNING ON EMPTY STOMACH WITH A FULL GLASS OF  WATER. 11/15/17   Pleas Koch, NP  lisinopril (PRINIVIL,ZESTRIL) 5 MG tablet Take 1 tablet (5 mg total) by mouth daily. For blood pressure. Patient not taking: Reported on 02/10/2018 10/30/17   Pleas Koch, NP    Allergies Prednisone; Bisphosphonates; Prevnar 13 [pneumococcal 13-val conj vacc]; and Food  Family History  Problem Relation Age of Onset  . Heart disease Mother   . Cancer Father        lung/chest wall   . Prostate cancer Father   . Diabetes Brother        siblings  . Hypertension Sister         x 2  . Heart disease Brother   . Breast cancer Paternal Grandmother   . Heart disease Sister   . Diabetes Sister   . Tuberculosis Sister   . Cirrhosis Brother     Social History Social History   Tobacco Use  . Smoking status: Former Smoker    Packs/day: 1.50    Years: 40.00    Pack years: 60.00    Types: Cigarettes    Last attempt to quit: 02/11/2009    Years since quitting: 9.0  . Smokeless tobacco: Never Used  Substance Use Topics  . Alcohol use: No    Alcohol/week: 0.0 standard drinks  . Drug use: No    Review of Systems Constitutional: Positive for fevers. Eyes: No visual changes. ENT: No sore throat. Cardiovascular: Denies chest pain. Respiratory: Positive for cough Gastrointestinal: No abdominal pain.  No nausea, no vomiting.  No diarrhea.   Genitourinary: Negative for dysuria. Musculoskeletal: Negative for back pain. Skin: Negative for rash. Neurological: Negative for headaches, focal weakness or numbness.  ____________________________________________   PHYSICAL EXAM:  VITAL SIGNS: ED Triage Vitals  Enc Vitals Group     BP 02/13/18 1509 (!) 147/66     Pulse Rate 02/13/18 1509 (!) 109     Resp 02/13/18 1509 16     Temp 02/13/18 1509 98.3 F (36.8 C)     Temp Source 02/13/18 1509 Oral     SpO2 02/13/18 1512 (!) 77 %     Weight 02/13/18 1510 181 lb 3.5 oz (82.2 kg)     Height 02/13/18 1510 4\' 11"  (1.499 m)     Head Circumference --      Peak Flow --      Pain Score 02/13/18 1510 7    Constitutional: Alert and oriented.  Eyes: Conjunctivae are normal.  ENT      Head: Normocephalic and atraumatic.      Nose: No congestion/rhinnorhea.      Mouth/Throat: Mucous membranes are moist.      Neck: No stridor. Hematological/Lymphatic/Immunilogical: No cervical lymphadenopathy. Cardiovascular: Normal rate, regular rhythm.  No murmurs, rubs, or gallops. Respiratory: Normal respiratory effort without tachypnea nor retractions. Slight expiratory  wheezing diffusely. Gastrointestinal: Soft and non tender. No rebound. No guarding.  Genitourinary: Deferred Musculoskeletal: Normal range of motion in all extremities. No lower extremity edema. Neurologic:  Normal speech and language. No gross focal neurologic deficits are appreciated.  Skin:  Skin is warm, dry and intact. No rash noted. Psychiatric: Mood and affect are normal. Speech and behavior are normal. Patient exhibits appropriate insight and judgment.  ____________________________________________    LABS (pertinent positives/negatives)  CBC wbc 12.3, hgb 12.5, plt 377 CMP na 137, k 2.9, glu 136, cr 0.48 Lactic acid 1.2 ____________________________________________   EKG  I, Nance Pear, attending physician, personally viewed and interpreted this EKG  EKG  Time: 1533 Rate: 99 Rhythm: sinus rhythm Axis: normal Intervals: qtc 502 QRS: narrow ST changes: no st elevation, st depression V5, V6 Impression: abnormal ekg   ____________________________________________    RADIOLOGY  CXR Partial clearing of pneumonia  ____________________________________________   PROCEDURES  Procedures  ____________________________________________   INITIAL IMPRESSION / ASSESSMENT AND PLAN / ED COURSE  Pertinent labs & imaging results that were available during my care of the patient were reviewed by me and considered in my medical decision making (see chart for details).   Patient presented to the emergency department today because of concerns for continued symptoms related to pneumonia that was diagnosed by primary care physician.  Patient was initially hypoxic here in the emergency department.  Mild leukocytosis in the serum and x-ray continues to show pneumonia. Concern for hypoxia in the setting of pneumonia.  Will plan on admission for IV antibiotics.  Discussed findings and plan with patient.  ____________________________________________   FINAL CLINICAL  IMPRESSION(S) / ED DIAGNOSES  Final diagnoses:  Pneumonia due to infectious organism, unspecified laterality, unspecified part of lung     Note: This dictation was prepared with Dragon dictation. Any transcriptional errors that result from this process are unintentional     Nance Pear, MD 02/14/18 [phone]

## 2018-02-13 NOTE — ED Triage Notes (Signed)
Seen Monday for cough/fever.  pcp put her on abt, but she could nnot tolerate.  So she changed to another abt yesterday.  Not getting better.

## 2018-02-13 NOTE — H&P (Signed)
[place] at [place] NAME: Teresa Jensen    MR#:  [id number]  DATE OF BIRTH:  03-07-1945  DATE OF ADMISSION:  02/13/2018  PRIMARY CARE PHYSICIAN: Pleas Koch, NP   REQUESTING/REFERRING PHYSICIAN: Dr. Archie Balboa  CHIEF COMPLAINT:   Chief Complaint  Patient presents with  . Cough  . Fever    HISTORY OF PRESENT ILLNESS:  Teresa Jensen  is a 73 y.o. female with a known history of COPD, polymyalgia rheumatica, hypertension presents to the emergency room due to worsening shortness of breath and weakness in spite of being on antibiotics for right upper lobe pneumonia.  Patient was started on doxycycline which she could not tolerate.  This was changed to Augmentin which she has taken for 2 days.  Due to worsening symptoms presents to the ED.  Saturations are 77% on room air and 90% on 2 L oxygen.  Chest x-ray continues to show right upper lobe pneumonia.  Elevated white count.  Has not been on steroids.  PAST MEDICAL HISTORY:   Past Medical History:  Diagnosis Date  . Anxiety   . CAP (community acquired pneumonia)   . COPD (chronic obstructive pulmonary disease) ([place])   . Epistaxis 01/09/2017  . GERD (gastroesophageal reflux disease)   . Hypertension   . New onset of headaches   . Osteopenia   . PMR (polymyalgia rheumatica) ([facility])   . Stasis dermatitis   . Strain of chest wall 01/20/2011  . Tobacco abuse     PAST SURGICAL HISTORY:   Past Surgical History:  Procedure Laterality Date  . ABDOMINAL HYSTERECTOMY    . APPENDECTOMY    . BACK SURGERY    . CARPAL TUNNEL RELEASE Left   . CERVICAL FUSION    . CHOLECYSTECTOMY    . HEMORRHOID SURGERY    . LUMBAR FUSION    . TUBAL LIGATION      SOCIAL HISTORY:   Social History   Tobacco Use  . Smoking status: Former Smoker    Packs/day: 1.50    Years: 40.00    Pack years: 60.00    Types: Cigarettes    Last attempt to quit: 02/11/2009    Years since quitting: 9.0  . Smokeless  tobacco: Never Used  Substance Use Topics  . Alcohol use: No    Alcohol/week: 0.0 standard drinks    FAMILY HISTORY:   Family History  Problem Relation Age of Onset  . Heart disease Mother   . Cancer Father        lung/chest wall   . Prostate cancer Father   . Diabetes Brother        siblings  . Hypertension Sister        x 2  . Heart disease Brother   . Breast cancer Paternal Grandmother   . Heart disease Sister   . Diabetes Sister   . Tuberculosis Sister   . Cirrhosis Brother     DRUG ALLERGIES:   Allergies  Allergen Reactions  . Prednisone Anaphylaxis, Hives, Shortness Of Breath, Itching and Swelling    The face swells  . Bisphosphonates Other (See Comments)    Unsure  . Prevnar 13 [Pneumococcal 13-Val Conj Vacc] Other (See Comments)    Reaction not recalled by the patient  . Food Rash    MANGO = cause rashes around the mouth    REVIEW OF SYSTEMS:   Review of Systems  Constitutional: Positive for malaise/fatigue. Negative for chills and fever.  HENT: Negative for sore throat.   Eyes: Negative for blurred vision, double vision and pain.  Respiratory: Positive for cough and shortness of breath. Negative for hemoptysis and wheezing.   Cardiovascular: Negative for chest pain, palpitations, orthopnea and leg swelling.  Gastrointestinal: Negative for abdominal pain, constipation, diarrhea, heartburn, nausea and vomiting.  Genitourinary: Negative for dysuria and hematuria.  Musculoskeletal: Negative for back pain and joint pain.  Skin: Negative for rash.  Neurological: Negative for sensory change, speech change, focal weakness and headaches.  Endo/Heme/Allergies: Does not bruise/bleed easily.  Psychiatric/Behavioral: Negative for depression. The patient is not nervous/anxious.     MEDICATIONS AT HOME:   Prior to Admission medications   Medication Sig Start Date End Date Taking? Authorizing Provider  albuterol (PROVENTIL HFA;VENTOLIN HFA) 108 (90 Base) MCG/ACT  inhaler Inhale 2 puffs into the lungs every 4 (four) hours as needed for shortness of breath. 01/21/18   Pleas Koch, NP  ALPRAZolam Duanne Moron) 0.25 MG tablet Take 1 tablet by mouth daily as needed for anxiety. 10/17/17   Pleas Koch, NP  amoxicillin-clavulanate (AUGMENTIN) 875-125 MG tablet Take 1 tablet by mouth 2 (two) times daily. 02/12/18   Pleas Koch, NP  clobetasol cream (TEMOVATE) 6.60 % Apply 1 application topically 2 (two) times daily.    [provider]  levothyroxine (SYNTHROID, LEVOTHROID) 75 MCG tablet TAKE 1 TABLET BY MOUTH EVERY MORNING ON EMPTY STOMACH WITH A FULL GLASS OF WATER. 11/15/17   Pleas Koch, NP  lisinopril (PRINIVIL,ZESTRIL) 5 MG tablet Take 1 tablet (5 mg total) by mouth daily. For blood pressure. Patient not taking: Reported on 02/10/2018 10/30/17   Pleas Koch, NP     VITAL SIGNS:  Blood pressure (!) 147/66, pulse (!) 109, temperature 98.3 F (36.8 C), temperature source Oral, resp. rate 16, height 4\' 11"  (1.499 m), weight 82.2 kg, SpO2 (!) 77 %.  PHYSICAL EXAMINATION:  Physical Exam  GENERAL:  73 y.o.-year-old patient lying in the bed with no acute distress.  Obese EYES: Pupils equal, round, reactive to light and accommodation. No scleral icterus. Extraocular muscles intact.  HEENT: Head atraumatic, normocephalic. Oropharynx and nasopharynx clear. No oropharyngeal erythema, moist oral mucosa  NECK:  Supple, no jugular venous distention. No thyroid enlargement, no tenderness.  LUNGS: Decreased air entry.  Mild expiratory wheezing CARDIOVASCULAR: S1, S2 normal. No murmurs, rubs, or gallops.  ABDOMEN: Soft, nontender, nondistended. Bowel sounds present. No organomegaly or mass.  EXTREMITIES: No pedal edema, cyanosis, or clubbing. + 2 pedal & radial pulses b/l.   NEUROLOGIC: Cranial nerves II through XII are intact. No focal Motor or sensory deficits appreciated b/l PSYCHIATRIC: The patient is alert and oriented x 3. Good  affect.  SKIN: No obvious rash, lesion, or ulcer.   LABORATORY PANEL:   CBC Recent Labs  Lab 02/13/18 1528  WBC 12.3*  HGB 12.5  HCT 40.5  PLT 377   ------------------------------------------------------------------------------------------------------------------  Chemistries  Recent Labs  Lab 02/13/18 1528  NA 137  K 2.9*  CL 96*  CO2 33*  GLUCOSE 136*  BUN 8  CREATININE 0.48  CALCIUM 7.9*  AST 42*  ALT 28  ALKPHOS 68  BILITOT 0.8   ------------------------------------------------------------------------------------------------------------------  Cardiac Enzymes No results for input(s): TROPONINI in the last 168 hours. ------------------------------------------------------------------------------------------------------------------  RADIOLOGY:  Dg Chest 2 View  Result Date: 02/13/2018 CLINICAL DATA:  Dyspnea EXAM: CHEST - 2 VIEW COMPARISON:  02/10/2018 CXR FINDINGS: The cardiopericardial silhouette is mildly enlarged. Aortic atherosclerosis is unchanged at the arch.  No aneurysm is identified. Coarsened interstitial lung markings are again redemonstrated with partial clearing of right upper lobe pneumonia now more atelectatic in appearance. Remote left-sided rib fracture involving the posterolateral seventh rib. No acute osseous appearing abnormality. Probable small posterior pleural effusions. IMPRESSION: 1. Partial clearing of right upper lobe pneumonia. 2. Small posterior pleural effusions. 3. Coarsened interstitial lung markings are again redemonstrated suspicious for chronic interstitial lung disease. Electronically Signed   By: Ashley Royalty M.D.   On: 02/13/2018 16:59   IMPRESSION AND PLAN:   *Right upper lobe pneumonia with acute hypoxic respiratory failure.  Influenza checked 2 days back and negative. Also has mild COPD exacerbation We will start IV steroids, ceftriaxone and azithromycin.  Nebulizers.  Continue home inhalers. Blood culture sent.  Pneumonia  order set used.  * HTN Home meds Monitor  *Hypokalemia.  Replace orally  *DVT prophylaxis with Lovenox  All the records are reviewed and case discussed with ED provider. Management plans discussed with the patient, family and they are in agreement.  CODE STATUS: FULL CODE  TOTAL TIME TAKING CARE OF THIS PATIENT: 40 minutes.   Leia Alf Macy Polio M.D on 02/13/2018 at 5:06 PM  Between 7am to 6pm - Pager - [phone]  After 6pm go to [email or web address] - password [email or web address] [facility]  Office  [phone]  CC: Primary care physician; Pleas Koch, NP  Note: This dictation was prepared with Dragon dictation along with smaller phrase technology. Any transcriptional errors that result from this process are unintentional.

## 2018-02-13 NOTE — Progress Notes (Signed)
Advance care planning  Purpose of Encounter Pneumonia  Parties in Attendance Patient  Patients Decisional capacity Alert and oriented.  Able to make medical decisions.  Her husband and son are joint healthcare power of attorney's.  Treatment plan for pneumonia discussed.  Prognosis discussed.  CODE STATUS discussed and patient wishes to be full code.  She has never had this conversation before and would like to have further conversation with family.  Encourage.  FULL CODE  Time spent - 17 minutes

## 2018-02-14 LAB — CBC
HCT: 40.6 % (ref 36.0–46.0)
Hemoglobin: 11.9 g/dL — ABNORMAL LOW (ref 12.0–15.0)
MCH: 28.5 pg (ref 26.0–34.0)
MCHC: 29.3 g/dL — ABNORMAL LOW (ref 30.0–36.0)
MCV: 97.1 fL (ref 80.0–100.0)
Platelets: 378 10*3/uL (ref 150–400)
RBC: 4.18 MIL/uL (ref 3.87–5.11)
RDW: 15 % (ref 11.5–15.5)
WBC: 11.7 10*3/uL — ABNORMAL HIGH (ref 4.0–10.5)
nRBC: 0.2 % (ref 0.0–0.2)

## 2018-02-14 LAB — BASIC METABOLIC PANEL
Anion gap: 6 (ref 5–15)
BUN: 7 mg/dL — ABNORMAL LOW (ref 8–23)
CO2: 34 mmol/L — ABNORMAL HIGH (ref 22–32)
Calcium: 8 mg/dL — ABNORMAL LOW (ref 8.9–10.3)
Chloride: 99 mmol/L (ref 98–111)
Creatinine, Ser: 0.48 mg/dL (ref 0.44–1.00)
GFR calc Af Amer: >60 mL/min (ref >60)
GFR calc non Af Amer: >60 mL/min (ref >60)
Glucose, Bld: 119 mg/dL — ABNORMAL HIGH (ref 70–99)
Potassium: 4.1 mmol/L (ref 3.5–5.1)
Sodium: 139 mmol/L (ref 135–145)

## 2018-02-14 LAB — STREP PNEUMONIAE URINARY ANTIGEN: Strep Pneumo Urinary Antigen: NEGATIVE

## 2018-02-14 MED ORDER — PREDNISONE 20 MG PO TABS: 20.0000 mg | ORAL_TABLET | Freq: Every day | ORAL | Status: DC

## 2018-02-14 MED ORDER — BOOST / RESOURCE BREEZE PO LIQD CUSTOM: 1.0000 | Freq: Three times a day (TID) | ORAL | Status: DC

## 2018-02-14 MED ORDER — GUAIFENESIN ER 600 MG PO TB12
600.0000 mg | ORAL_TABLET | Freq: Two times a day (BID) | ORAL | Status: DC
  Administered 2018-02-14 – 2018-02-17 (×7): 600 mg via ORAL
  Filled 2018-02-14 (×7): qty 1

## 2018-02-14 MED ORDER — PREDNISONE 20 MG PO TABS: 40.0000 mg | ORAL_TABLET | Freq: Every day | ORAL | Status: DC

## 2018-02-14 MED ORDER — ADULT MULTIVITAMIN W/MINERALS CH
1.0000 | ORAL_TABLET | Freq: Every day | ORAL | Status: DC
  Administered 2018-02-14 – 2018-02-17 (×4): 1 via ORAL
  Filled 2018-02-14 (×4): qty 1

## 2018-02-14 MED ORDER — PREDNISONE 10 MG PO TABS: 10.0000 mg | ORAL_TABLET | Freq: Every day | ORAL | Status: DC

## 2018-02-14 MED ORDER — PREDNISONE 10 MG PO TABS: 30.0000 mg | ORAL_TABLET | Freq: Every day | ORAL | Status: DC

## 2018-02-14 MED ORDER — PREDNISONE 50 MG PO TABS: 50.0000 mg | ORAL_TABLET | Freq: Every day | ORAL | Status: DC

## 2018-02-14 MED ORDER — GUAIFENESIN-DM 100-10 MG/5ML PO SYRP
5.0000 mL | ORAL_SOLUTION | ORAL | Status: DC | PRN
  Administered 2018-02-14 – 2018-02-15 (×2): 5 mL via ORAL
  Filled 2018-02-14 (×3): qty 5

## 2018-02-14 NOTE — Progress Notes (Signed)
   02/14/18 1000  Clinical Encounter Type  Visited With Patient and family together  Visit Type Initial  Referral From Nurse  Spiritual Encounters  Spiritual Needs Prayer  Stress Factors  Patient Stress Factors Health changes  Ch received an OR regarding an AD. Pt wanted a copy of an AD and preferred to go over it by herself rather than ch providing education. Pt stated that she has been sick for a while and requested a prayer. Son and Husband were at bedside. Ch prayed with the family and left an AD for the pt.

## 2018-02-14 NOTE — Progress Notes (Addendum)
[place] at [place] NAME: Teresa Jensen    MR#:  [id number]  DATE OF BIRTH:  01/03/1945  SUBJECTIVE:  CHIEF COMPLAINT:   Chief Complaint  Patient presents with  . Cough  . Fever   -Feels much better, still requiring 3 L oxygen which is acute -Cough is improving  REVIEW OF SYSTEMS:  Review of Systems  Constitutional: Positive for malaise/fatigue. Negative for chills and fever.  HENT: Negative for congestion, ear discharge, hearing loss and nosebleeds.   Eyes: Negative for blurred vision and double vision.  Respiratory: Positive for cough. Negative for shortness of breath and wheezing.   Cardiovascular: Negative for chest pain and palpitations.  Gastrointestinal: Negative for abdominal pain, constipation, diarrhea, nausea and vomiting.  Genitourinary: Negative for dysuria.  Musculoskeletal: Negative for myalgias.  Neurological: Negative for dizziness, focal weakness, seizures, weakness and headaches.  Psychiatric/Behavioral: Negative for depression.    DRUG ALLERGIES:   Allergies  Allergen Reactions  . Prednisone Anaphylaxis, Hives, Shortness Of Breath, Itching and Swelling    The face swells  . Bisphosphonates Other (See Comments)    Unsure  . Prevnar 13 [Pneumococcal 13-Val Conj Vacc] Other (See Comments)    Reaction not recalled by the patient  . Food Rash    MANGO = cause rashes around the mouth    VITALS:  Blood pressure (!) 141/61, pulse 88, temperature 97.8 F (36.6 C), temperature source Oral, resp. rate 20, height 4\' 11"  (1.499 m), weight 81.4 kg, SpO2 93 %.  PHYSICAL EXAMINATION:  Physical Exam   GENERAL:  73 y.o.-year-old very pleasant patient sitting in the bed with no acute distress.  EYES: Pupils equal, round, reactive to light and accommodation. No scleral icterus. Extraocular muscles intact.  HEENT: Head atraumatic, normocephalic. Oropharynx and nasopharynx clear.  NECK:  Supple, no jugular venous  distention. No thyroid enlargement, no tenderness.  LUNGS: Normal breath sounds bilaterally, no wheezing, rales,rhonchi or crepitation. diminished basilar breath sounds. No use of accessory muscles of respiration.  CARDIOVASCULAR: S1, S2 normal. No  rubs, or gallops. 2/6 systolic murmur present ABDOMEN: Soft, nontender, nondistended. Bowel sounds present. No organomegaly or mass.  EXTREMITIES: No pedal edema, cyanosis, or clubbing.  NEUROLOGIC: Cranial nerves II through XII are intact. Muscle strength 5/5 in all extremities. Sensation intact. Gait not checked.  PSYCHIATRIC: The patient is alert and oriented x 3.  SKIN: No obvious rash, lesion, or ulcer.    LABORATORY PANEL:   CBC Recent Labs  Lab 02/14/18 0538  WBC 11.7*  HGB 11.9*  HCT 40.6  PLT 378   ------------------------------------------------------------------------------------------------------------------  Chemistries  Recent Labs  Lab 02/13/18 1528 02/14/18 0538  NA 137 139  K 2.9* 4.1  CL 96* 99  CO2 33* 34*  GLUCOSE 136* 119*  BUN 8 7*  CREATININE 0.48 0.48  CALCIUM 7.9* 8.0*  AST 42*  --   ALT 28  --   ALKPHOS 68  --   BILITOT 0.8  --    ------------------------------------------------------------------------------------------------------------------  Cardiac Enzymes No results for input(s): TROPONINI in the last 168 hours. ------------------------------------------------------------------------------------------------------------------  RADIOLOGY:  Dg Chest 2 View  Result Date: 02/13/2018 CLINICAL DATA:  Dyspnea EXAM: CHEST - 2 VIEW COMPARISON:  02/10/2018 CXR FINDINGS: The cardiopericardial silhouette is mildly enlarged. Aortic atherosclerosis is unchanged at the arch. No aneurysm is identified. Coarsened interstitial lung markings are again redemonstrated with partial clearing of right upper lobe pneumonia now more atelectatic in appearance. Remote left-sided rib fracture involving the  posterolateral  seventh rib. No acute osseous appearing abnormality. Probable small posterior pleural effusions. IMPRESSION: 1. Partial clearing of right upper lobe pneumonia. 2. Small posterior pleural effusions. 3. Coarsened interstitial lung markings are again redemonstrated suspicious for chronic interstitial lung disease. Electronically Signed   By: Ashley Royalty M.D.   On: 02/13/2018 16:59    EKG:   Orders placed or performed during the hospital encounter of 02/13/18  . ED EKG 12-Lead  . ED EKG 12-Lead    ASSESSMENT AND PLAN:   73 year old female with past medical history significant for COPD, GERD, hypertension, polymyalgia rheumatica presents to hospital secondary to worsening shortness of breath  1.  Community-acquired pneumonia-was on outpatient doxycycline, chest x-ray with right lower lobe infiltrate which actually slightly better compared to outpatient chest x-ray -Still requiring 3 L oxygen which is acute.  Continue to wean off oxygen -On Rocephin and azithromycin -Nebulizers and inhalers for mild reactive airway disease. -Influenza test recently negative -Follow blood cultures  2.  Hypokalemia- replaced  3.  Hypothyroidism-continue Synthroid  4.  COPD-much improved breathing.  No wheezing noted.   Dc steroids.  5.  DVT prophylaxis-Lovenox  Patient is very independent at baseline.  Encourage ambulation   All the records are reviewed and case discussed with Care Management/Social Workerr. Management plans discussed with the patient, family and they are in agreement.  CODE STATUS: Full Code  TOTAL TIME TAKING CARE OF THIS PATIENT: 38 minutes.   POSSIBLE D/C IN 2 DAYS, DEPENDING ON CLINICAL CONDITION.   Gladstone Lighter M.D on 02/14/2018 at 3:09 PM  Between 7am to 6pm - Pager - [phone]  After 6pm go to [email or web address] - Proofreader  [facility]  Office  [phone]  CC: Primary care physician; Pleas Koch, NP

## 2018-02-14 NOTE — Progress Notes (Signed)
Initial Nutrition Assessment  DOCUMENTATION CODES:   Obesity unspecified  INTERVENTION:  Boost Breeze po BID, each supplement provides 250 kcal and 9 grams of protein (patient would like to try orange) MVI   NUTRITION DIAGNOSIS:   Increased nutrient needs related to acute illness as evidenced by energy intake < 75% for > or equal to 1 month.   GOAL:   Patient will meet greater than or equal to 90% of their needs   MONITOR:   PO intake, Supplement acceptance  REASON FOR ASSESSMENT:   Malnutrition Screening Tool    ASSESSMENT:  73 year old female presented to ED with increasing SOB and weakness who is currently on antibiotics for rt upper lobe pneumonia diagnosed 2/10. PMH includes COPD, polymyalgia rheumatica, Hypothyroidism,HTN, GERD, Osteopenia, cholecystectomy   Very pleasant patient sitting up in bed with husband in room at visit. Patient reports feeling much better today improvements to appetite and intake. Patient experienced poor PO lasting 2 weeks prior to admission d/t weakness and not feeling well.   Patient stated that she was a little disappointed that she was unable to order her breakfast; she would have ordered bacon and eggs instead of pancakes. Patient reports eating approximately 85% of breakfast.  At home, patient stated that she cooks 6 days/week and enjoys grilling chicken, pork chops, and steaks. Patient does not like fried foods and uses EVOO and herbs to season meat/vegeatables.  Patient denies recent changes in weight and recalls UBW in the 180s.   Patient open to ONS during stay and requested an alternate supplement that does not have a milky taste. Patient agreeable to [facility] in [place].  Medications reviewed and include: zithromax, methylprednisolone  No pertinent labs  NUTRITION - FOCUSED PHYSICAL EXAM:    Most Recent Value  Orbital Region  Mild depletion  Upper Arm Region  No depletion  Thoracic and Lumbar Region  No depletion   Buccal Region  Mild depletion  Temple Region  Mild depletion  Clavicle Bone Region  No depletion  Clavicle and Acromion Bone Region  No depletion  Scapular Bone Region  No depletion  Dorsal Hand  Mild depletion  Patellar Region  No depletion  Anterior Thigh Region  No depletion  Posterior Calf Region  Mild depletion  Edema (RD Assessment)  None  Hair  Reviewed  Eyes  Reviewed  Mouth  Reviewed  Skin  Reviewed  Nails  Reviewed [painted]       Diet Order:   Diet Order            Diet Heart Room service appropriate? Yes; Fluid consistency: Thin  Diet effective now              EDUCATION NEEDS:   No education needs have been identified at this time  Skin:  Skin Assessment: Reviewed RN Assessment  Last BM:  2/13  Height:   Ht Readings from Last 1 Encounters:  02/13/18 4\' 11"  (1.499 m)    Weight:   Wt Readings from Last 1 Encounters:  02/13/18 81.4 kg    Ideal Body Weight:  44.5 kg  BMI:  Body mass index is 36.25 kg/m.  Estimated Nutritional Needs:   Kcal:  1550-1750  Protein:  87-98 grams  Fluid:  1.8L/day    Lajuan Lines, RD, LDN  After Hours/Weekend Pager: [phone]

## 2018-02-14 NOTE — Progress Notes (Signed)
MEDICATION RELATED CONSULT NOTE - FOLLOW UP   Pharmacy Consult for Prednisone Taper  Allergies  Allergen Reactions  . Prednisone Anaphylaxis, Hives, Shortness Of Breath, Itching and Swelling    The face swells  . Bisphosphonates Other (See Comments)    Unsure  . Prevnar 13 [Pneumococcal 13-Val Conj Vacc] Other (See Comments)    Reaction not recalled by the patient  . Food Rash    MANGO = cause rashes around the mouth    Plan:  Discontinued Solumedrol and placed orders for Prednisone taper per MD requests. Begin with Prednisone 50mg  today and taper by 10mg  daily. Noted listed allergy to Prednisone but patient has been taking Solumedrol without incident.  Paulina Fusi, PharmD, BCPS 02/14/2018 3:30 PM

## 2018-02-15 LAB — CBC
HCT: 40.3 % (ref 36.0–46.0)
Hemoglobin: 11.7 g/dL — ABNORMAL LOW (ref 12.0–15.0)
MCH: 28.3 pg (ref 26.0–34.0)
MCHC: 29 g/dL — ABNORMAL LOW (ref 30.0–36.0)
MCV: 97.6 fL (ref 80.0–100.0)
Platelets: 388 10*3/uL (ref 150–400)
RBC: 4.13 MIL/uL (ref 3.87–5.11)
RDW: 14.9 % (ref 11.5–15.5)
WBC: 12 10*3/uL — ABNORMAL HIGH (ref 4.0–10.5)
nRBC: 0 % (ref 0.0–0.2)

## 2018-02-15 LAB — BASIC METABOLIC PANEL
Anion gap: 5 (ref 5–15)
BUN: 11 mg/dL (ref 8–23)
CO2: 37 mmol/L — ABNORMAL HIGH (ref 22–32)
Calcium: 8.8 mg/dL — ABNORMAL LOW (ref 8.9–10.3)
Chloride: 99 mmol/L (ref 98–111)
Creatinine, Ser: 0.58 mg/dL (ref 0.44–1.00)
GFR calc Af Amer: >60 mL/min (ref >60)
GFR calc non Af Amer: >60 mL/min (ref >60)
Glucose, Bld: 109 mg/dL — ABNORMAL HIGH (ref 70–99)
Potassium: 4.3 mmol/L (ref 3.5–5.1)
Sodium: 141 mmol/L (ref 135–145)

## 2018-02-15 LAB — BRAIN NATRIURETIC PEPTIDE: B Natriuretic Peptide: 248 pg/mL — ABNORMAL HIGH (ref 0.0–100.0)

## 2018-02-15 MED ORDER — FUROSEMIDE 40 MG PO TABS
60.0000 mg | ORAL_TABLET | Freq: Once | ORAL | Status: AC
  Administered 2018-02-15: 14:00:00 60 mg via ORAL
  Filled 2018-02-15: qty 1

## 2018-02-15 MED ORDER — POTASSIUM CHLORIDE CRYS ER 20 MEQ PO TBCR
40.0000 meq | EXTENDED_RELEASE_TABLET | Freq: Once | ORAL | Status: AC
  Administered 2018-02-15: 40 meq via ORAL
  Filled 2018-02-15: qty 2

## 2018-02-15 NOTE — Evaluation (Signed)
Physical Therapy Evaluation Patient Details Name: Teresa Jensen MRN: [id number] DOB: April 08, 1945 Today's Date: 02/15/2018   History of Present Illness  Patient is a pleasant 73 year old female who presents to hospital for shortness of breath secondary to pneumonia. Is currently on 2L 02 via nasal cannula however does not use oxygen at home. Pt. has a history of COPD, polymyalgia rheumatica, and HTN. Used to use a Hurrycane occasionally but does not have one anymore.   Clinical Impression  Patient is a pleasant 73 year old female presenting secondary to pneumonia. Patient ambulated with CGA requiring occasional rest breaks with verbal cueing for breathing in through nose and out through mouth to increase Sp02 levels to >90 while on 2L02 via nasal cannula. Patient requires occasional wall touches for stability indicating use of cane at home would be beneficial to decrease falls risk. Patient's stability appears to improve after verbal cueing for breathing to maintain functional Sp02 levels. At this time physical therapy would benefit patient to increase capacity for functional mobility while maintaining safe Sp02 levels.     Follow Up Recommendations No PT follow up    Equipment Recommendations  Cane    Recommendations for Other Services       Precautions / Restrictions Precautions Precautions: None Precaution Comments: using 2 L 02 via nasal cannula  Restrictions Weight Bearing Restrictions: No      Mobility  Bed Mobility Overal bed mobility: Independent             General bed mobility comments: Independent with bed mobility, requires verbal cueing for breathing in through nose for 02 saturation with nasal cannula.   Transfers Overall transfer level: Modified independent               General transfer comment: Patient performs STS transfer with CGA. Requires rest break upon standing to increase Sp02 to >90 on 2L02 via nasal cannula.    Ambulation/Gait Ambulation/Gait assistance: Supervision;Min guard Gait Distance (Feet): 175 Feet   Gait Pattern/deviations: WFL(Within Functional Limits);Narrow base of support Gait velocity: .14m/s with Sp02 at functional range.    General Gait Details: Patient ambulates at a functional pace however requires one standing rest break to return Sp02 to >90 on 2L02 via nasal cannula. Requires verbal cueing for breathing in through nose.   Stairs            Wheelchair Mobility    Modified Rankin (Stroke Patients Only)       Balance Overall balance assessment: Needs assistance;Modified Independent Sitting-balance support: Feet unsupported;No upper extremity supported Sitting balance-Leahy Scale: Normal Sitting balance - Comments: Ability to reach inside and outside BOS without LOB    Standing balance support: No upper extremity supported Standing balance-Leahy Scale: Fair Standing balance comment: Fair +. Able to ambulate with CGA however occasionally requires touch to walls for stability. Requires rest breaks for breathing to increase Sp02. Unable to reach across midline or outside BOS.                              Pertinent Vitals/Pain Pain Assessment: No/denies pain    Home Living Family/patient expects to be discharged to:: Private residence Living Arrangements: Spouse/significant other Available Help at Discharge: Family Type of Home: House(townhome) Home Access: Level entry     Home Layout: Able to live on main level with bedroom/bathroom(never goes upstairs per patient report) Home Equipment: Cane - single point(hurrycane) Additional Comments: Patient is an active 73  year old female who watches her grandchildren during the day. She used to use a hurrycane but has not had one for a while and would like to return to using one due to feeling a little more unsteady now that she has been ill.     Prior Function Level of Independence: Independent          Comments: Performs all tasks independently, watches her grandchildren.      Hand Dominance        Extremity/Trunk Assessment   Upper Extremity Assessment Upper Extremity Assessment: Overall WFL for tasks assessed    Lower Extremity Assessment Lower Extremity Assessment: Overall WFL for tasks assessed;Generalized weakness(Gross 4/5 bilaterally. Requires cueing for breathing through nose for Sp02 saturation)    Cervical / Trunk Assessment Cervical / Trunk Assessment: Normal  Communication   Communication: No difficulties  Cognition Arousal/Alertness: Awake/alert Behavior During Therapy: WFL for tasks assessed/performed Overall Cognitive Status: Within Functional Limits for tasks assessed                                 General Comments: No noticeable bruising, good responses and ability to follow complex directions and conversations.       General Comments General comments (skin integrity, edema, etc.): No noticeable bruising, good physical appearance from gross assessment.     Exercises Other Exercises Other Exercises: standing: static balance with verbal cueing for breathing in through nose, out through mouth to increase Sp02 levels to >90.  Other Exercises: Seated marches, LAQ, ankle pumps    Assessment/Plan    PT Assessment Patient needs continued PT services  PT Problem List Decreased strength;Decreased activity tolerance;Decreased mobility;Cardiopulmonary status limiting activity       PT Treatment Interventions Gait training;Stair training;Therapeutic exercise;Therapeutic activities;Balance training;Functional mobility training;Patient/family education    PT Goals (Current goals can be found in the Care Plan section)  Acute Rehab PT Goals Patient Stated Goal: Patient desires to return home to PLOF.  PT Goal Formulation: With patient Time For Goal Achievement: 03/01/18 Potential to Achieve Goals: Fair    Frequency Min 3X/week   Barriers to  discharge   Requires cueing for breathing in through nose rather than mouth to keep Sp02 levels functional via 2L o2 nasal cannula.     Co-evaluation               AM-PAC PT "6 Clicks" Mobility  Outcome Measure Help needed turning from your back to your side while in a flat bed without using bedrails?: None Help needed moving from lying on your back to sitting on the side of a flat bed without using bedrails?: None Help needed moving to and from a bed to a chair (including a wheelchair)?: None Help needed standing up from a chair using your arms (e.g., wheelchair or bedside chair)?: None Help needed to walk in hospital room?: A Little Help needed climbing 3-5 steps with a railing? : A Lot 6 Click Score: 21    End of Session Equipment Utilized During Treatment: Gait belt;Oxygen(2 L02 via nasal cannula) Activity Tolerance: Patient tolerated treatment well Patient left: in bed;with family/visitor present;with call bell/phone within reach Nurse Communication: Mobility status PT Visit Diagnosis: Muscle weakness (generalized) (M62.81);Other abnormalities of gait and mobility (R26.89)    Time: 6283-1517 PT Time Calculation (min) (ACUTE ONLY): 28 min   Charges:   PT Evaluation $PT Eval Low Complexity: 1 Low PT Treatments $Gait Training: 8-22 mins  Janna Arch, PT, DPT    Janna Arch 02/15/2018, 9:48 AM

## 2018-02-15 NOTE — Progress Notes (Signed)
Took over pt care at 1500, pt alert and oriented, up to chair in room with husband, no complaints noted

## 2018-02-15 NOTE — Progress Notes (Signed)
[facility] [place] NAME: Teresa Jensen    MR#:  [id number]  DATE OF BIRTH:  [birth year]  SUBJECTIVE:  CHIEF COMPLAINT:   Chief Complaint  Patient presents with  . Cough  . Fever   Continues to have shortness of breath.  Still on oxygen.  No chest pain.  Has orthopnea.  No lower extremity edema.  Poor appetite.  Afebrile.  REVIEW OF SYSTEMS:  Review of Systems  Constitutional: Positive for malaise/fatigue. Negative for chills and fever.  HENT: Negative for congestion, ear discharge, hearing loss and nosebleeds.   Eyes: Negative for blurred vision and double vision.  Respiratory: Positive for cough. Negative for shortness of breath and wheezing.   Cardiovascular: Negative for chest pain and palpitations.  Gastrointestinal: Negative for abdominal pain, constipation, diarrhea, nausea and vomiting.  Genitourinary: Negative for dysuria.  Musculoskeletal: Negative for myalgias.  Neurological: Negative for dizziness, focal weakness, seizures, weakness and headaches.  Psychiatric/Behavioral: Negative for depression.   DRUG ALLERGIES:   Allergies  Allergen Reactions  . Prednisone Anaphylaxis, Hives, Shortness Of Breath, Itching and Swelling    The face swells  . Bisphosphonates Other (See Comments)    Unsure  . Prevnar 13 [Pneumococcal 13-Val Conj Vacc] Other (See Comments)    Reaction not recalled by the patient  . Food Rash    MANGO = cause rashes around the mouth    VITALS:  Blood pressure (!) 145/68, pulse 81, temperature 98.2 F (36.8 C), resp. rate 18, height 4\' 11"  (1.499 m), weight 83.5 kg, SpO2 92 %.  PHYSICAL EXAMINATION:  Physical Exam   GENERAL:  73 y.o.-year-old very pleasant patient sitting in the bed with conversational dyspnea EYES: Pupils equal, round, reactive to light and accommodation. No scleral icterus. Extraocular muscles intact.  HEENT: Head atraumatic, normocephalic. Oropharynx and nasopharynx clear.    NECK:  Supple, no jugular venous distention. No thyroid enlargement, no tenderness.  LUNGS: Bibasilar crackles CARDIOVASCULAR: S1, S2 normal. No  rubs, or gallops. 2/6 systolic murmur present ABDOMEN: Soft, nontender, nondistended. Bowel sounds present. No organomegaly or mass.  EXTREMITIES: No pedal edema, cyanosis, or clubbing.  NEUROLOGIC: Cranial nerves II through XII are intact. Muscle strength 5/5 in all extremities. Sensation intact. Gait not checked.  PSYCHIATRIC: The patient is alert and oriented x 3.  SKIN: No obvious rash, lesion, or ulcer.    LABORATORY PANEL:   CBC Recent Labs  Lab 02/15/18 0539  WBC 12.0*  HGB 11.7*  HCT 40.3  PLT 388   ------------------------------------------------------------------------------------------------------------------  Chemistries  Recent Labs  Lab 02/13/18 1528  02/15/18 0539  NA 137   < > 141  K 2.9*   < > 4.3  CL 96*   < > 99  CO2 33*   < > 37*  GLUCOSE 136*   < > 109*  BUN 8   < > 11  CREATININE 0.48   < > 0.58  CALCIUM 7.9*   < > 8.8*  AST 42*  --   --   ALT 28  --   --   ALKPHOS 68  --   --   BILITOT 0.8  --   --    < > = values in this interval not displayed.   ------------------------------------------------------------------------------------------------------------------  Cardiac Enzymes No results for input(s): TROPONINI in the last 168 hours. ------------------------------------------------------------------------------------------------------------------  RADIOLOGY:  Dg Chest 2 View  Result Date: 02/13/2018 CLINICAL DATA:  Dyspnea EXAM: CHEST - 2 VIEW COMPARISON:  02/10/2018  CXR FINDINGS: The cardiopericardial silhouette is mildly enlarged. Aortic atherosclerosis is unchanged at the arch. No aneurysm is identified. Coarsened interstitial lung markings are again redemonstrated with partial clearing of right upper lobe pneumonia now more atelectatic in appearance. Remote left-sided rib fracture involving the  posterolateral seventh rib. No acute osseous appearing abnormality. Probable small posterior pleural effusions. IMPRESSION: 1. Partial clearing of right upper lobe pneumonia. 2. Small posterior pleural effusions. 3. Coarsened interstitial lung markings are again redemonstrated suspicious for chronic interstitial lung disease. Electronically Signed   By: Ashley Royalty M.D.   On: 02/13/2018 16:59    EKG:   Orders placed or performed during the hospital encounter of 02/13/18  . ED EKG 12-Lead  . ED EKG 12-Lead    ASSESSMENT AND PLAN:   73 year old female with past medical history significant for COPD, GERD, hypertension, polymyalgia rheumatica presents to hospital secondary to worsening shortness of breath  1.  Community-acquired pneumonia-was on outpatient doxycycline, chest x-ray with right lower lobe infiltrate which actually slightly better compared to outpatient chest x-ray -Still requiring 3 L oxygen which is acute.  Continue to wean off oxygen -On Rocephin and azithromycin -Nebulizers and inhalers for mild reactive airway disease. -Influenza test recently negative -Follow blood cultures  2.  Hypokalemia- replaced  3.  Hypothyroidism-continue Synthroid  4.  COPD-much improved breathing.  No wheezing noted.   Dc steroids.  5.  DVT prophylaxis-Lovenox  Some crackles on examination and chronic orthopnea.  Mild elevation in BNP.  Will check echocardiogram.  1 dose of Lasix today to see if patient diuresis.  Reassess patient after diuresis.  Patient is very independent at baseline.  Encouraged ambulation   All the records are reviewed and case discussed with Care Management/Social Workerr. Management plans discussed with the patient, family and they are in agreement.  CODE STATUS: Full Code  TOTAL TIME TAKING CARE OF THIS PATIENT: 35 minutes.   POSSIBLE D/C IN 1-2 DAYS, DEPENDING ON CLINICAL CONDITION.   Leia Alf Emmerson Shuffield M.D on 02/15/2018 at 11:19 AM  Between 7am to 6pm -  Pager - [phone]  After 6pm go to [email or web address] - Proofreader  [facility]  Office  [phone]  CC: Primary care physician; Pleas Koch, NP

## 2018-02-15 NOTE — Progress Notes (Signed)
PT Cancellation Note  Patient Details Name: Teresa Jensen MRN: [id number] DOB: 02/03/1945   Cancelled Treatment:    Reason Eval/Treat Not Completed: Other (comment) Patient eating breakfast. Requests PT return after finished eating and drinking her coffee. Will return at later time today.   Janna Arch, PT, DPT   02/15/2018, 8:18 AM

## 2018-02-16 ENCOUNTER — Inpatient Hospital Stay: Payer: Medicare Other

## 2018-02-16 ENCOUNTER — Inpatient Hospital Stay (HOSPITAL_COMMUNITY)
Admit: 2018-02-16 | Discharge: 2018-02-16 | Disposition: A | Discharge status: Home / Self Care | Payer: Medicare Other | Attending: Internal Medicine | Admitting: Internal Medicine

## 2018-02-16 DIAGNOSIS — I340 Nonrheumatic mitral (valve) insufficiency: Secondary | ICD-10-CM

## 2018-02-16 LAB — BASIC METABOLIC PANEL
Anion gap: 8 (ref 5–15)
BUN: 11 mg/dL (ref 8–23)
CO2: 37 mmol/L — ABNORMAL HIGH (ref 22–32)
Calcium: 8.5 mg/dL — ABNORMAL LOW (ref 8.9–10.3)
Chloride: 92 mmol/L — ABNORMAL LOW (ref 98–111)
Creatinine, Ser: 0.77 mg/dL (ref 0.44–1.00)
GFR calc Af Amer: >60 mL/min (ref >60)
GFR calc non Af Amer: >60 mL/min (ref >60)
Glucose, Bld: 189 mg/dL — ABNORMAL HIGH (ref 70–99)
Potassium: 4 mmol/L (ref 3.5–5.1)
Sodium: 137 mmol/L (ref 135–145)

## 2018-02-16 LAB — ECHOCARDIOGRAM COMPLETE
Height: 59 in
Weight: 2945.35 oz

## 2018-02-16 MED ORDER — ORAL CARE MOUTH RINSE
15.0000 mL | Freq: Two times a day (BID) | OROMUCOSAL | Status: DC
  Administered 2018-02-16 – 2018-02-17 (×2): 15 mL via OROMUCOSAL

## 2018-02-16 MED ORDER — IPRATROPIUM-ALBUTEROL 0.5-2.5 (3) MG/3ML IN SOLN
3.0000 mL | Freq: Three times a day (TID) | RESPIRATORY_TRACT | Status: DC
  Administered 2018-02-16 – 2018-02-17 (×5): 3 mL via RESPIRATORY_TRACT
  Filled 2018-02-16 (×5): qty 3

## 2018-02-16 MED ORDER — FUROSEMIDE 40 MG PO TABS
60.0000 mg | ORAL_TABLET | Freq: Once | ORAL | Status: AC
  Administered 2018-02-16: 12:00:00 60 mg via ORAL
  Filled 2018-02-16: qty 1

## 2018-02-16 NOTE — Progress Notes (Signed)
attempted to wean pt off of acute 2l O2. sats dropped to 88%. Reapplied 2 L O2 via [place] sats 88%  IS giving to pt education provided

## 2018-02-16 NOTE — Progress Notes (Signed)
[facility] at [place] NAME: Teresa Jensen    MR#:  [id number]  DATE OF BIRTH:  05-May-1945  SUBJECTIVE:  CHIEF COMPLAINT:   Chief Complaint  Patient presents with  . Cough  . Fever   Continues to have shortness of breath and orthopnea.  Saturations dropped into 80s on room air.  On 2 L oxygen.  REVIEW OF SYSTEMS:  Review of Systems  Constitutional: Positive for malaise/fatigue. Negative for chills and fever.  HENT: Negative for congestion, ear discharge, hearing loss and nosebleeds.   Eyes: Negative for blurred vision and double vision.  Respiratory: Positive for cough. Negative for shortness of breath and wheezing.   Cardiovascular: Negative for chest pain and palpitations.  Gastrointestinal: Negative for abdominal pain, constipation, diarrhea, nausea and vomiting.  Genitourinary: Negative for dysuria.  Musculoskeletal: Negative for myalgias.  Neurological: Negative for dizziness, focal weakness, seizures, weakness and headaches.  Psychiatric/Behavioral: Negative for depression.   DRUG ALLERGIES:   Allergies  Allergen Reactions  . Prednisone Anaphylaxis, Hives, Shortness Of Breath, Itching and Swelling    The face swells  . Bisphosphonates Other (See Comments)    Unsure  . Prevnar 13 [Pneumococcal 13-Val Conj Vacc] Other (See Comments)    Reaction not recalled by the patient  . Food Rash    MANGO = cause rashes around the mouth    VITALS:  Blood pressure (!) 133/47, pulse (!) 103, temperature 97.9 F (36.6 C), temperature source Oral, resp. rate 20, height 4\' 11"  (1.499 m), weight 83.5 kg, SpO2 92 %.  PHYSICAL EXAMINATION:  Physical Exam   GENERAL:  73 y.o.-year-old very pleasant patient sitting in the bed with conversational dyspnea EYES: Pupils equal, round, reactive to light and accommodation. No scleral icterus. Extraocular muscles intact.  HEENT: Head atraumatic, normocephalic. Oropharynx and nasopharynx clear.    NECK:  Supple, no jugular venous distention. No thyroid enlargement, no tenderness.  LUNGS: Bilateral crackles CARDIOVASCULAR: S1, S2 normal. No  rubs, or gallops. 2/6 systolic murmur present ABDOMEN: Soft, nontender, nondistended. Bowel sounds present. No organomegaly or mass.  EXTREMITIES: No pedal edema, cyanosis, or clubbing.  NEUROLOGIC: Cranial nerves II through XII are intact. Muscle strength 5/5 in all extremities. Sensation intact. Gait not checked.  PSYCHIATRIC: The patient is alert and oriented x 3.  SKIN: No obvious rash, lesion, or ulcer.    LABORATORY PANEL:   CBC Recent Labs  Lab 02/15/18 0539  WBC 12.0*  HGB 11.7*  HCT 40.3  PLT 388   ------------------------------------------------------------------------------------------------------------------  Chemistries  Recent Labs  Lab 02/13/18 1528  02/15/18 0539  NA 137   < > 141  K 2.9*   < > 4.3  CL 96*   < > 99  CO2 33*   < > 37*  GLUCOSE 136*   < > 109*  BUN 8   < > 11  CREATININE 0.48   < > 0.58  CALCIUM 7.9*   < > 8.8*  AST 42*  --   --   ALT 28  --   --   ALKPHOS 68  --   --   BILITOT 0.8  --   --    < > = values in this interval not displayed.   ------------------------------------------------------------------------------------------------------------------  Cardiac Enzymes No results for input(s): TROPONINI in the last 168 hours. ------------------------------------------------------------------------------------------------------------------  RADIOLOGY:  No results found.  EKG:   Orders placed or performed during the hospital encounter of 02/13/18  . ED EKG 12-Lead  .  ED EKG 12-Lead    ASSESSMENT AND PLAN:   73 year old female with past medical history significant for COPD, GERD, hypertension, polymyalgia rheumatica presents to hospital secondary to worsening shortness of breath  *  Community-acquired pneumonia-was on outpatient doxycycline, chest x-ray with right infiltrate -Still  requiring 2 L oxygen which is acute.  Continue to wean off oxygen.  Try today but desaturated into the 80s on room air -On Rocephin and azithromycin -Nebulizers and inhalers for mild reactive airway disease. -Influenza test recently negative -Follow blood cultures  *Elevated BNP with bilateral crackles Concern regarding CHF.  Ordered echocardiogram.  Will repeat chest x-ray. We will give 1 dose of Lasix.  *  Hypokalemia- replaced  *  Hypothyroidism-continue Synthroid  *  COPD-much improved breathing.  No wheezing noted.   Off steroids  *  DVT prophylaxis-Lovenox  Patient is very independent at baseline.    All the records are reviewed and case discussed with Care Management/Social Workerr. Management plans discussed with the patient, family and they are in agreement.  CODE STATUS: Full Code  TOTAL TIME TAKING CARE OF THIS PATIENT: 35 minutes.   POSSIBLE D/C IN 1-2 DAYS, DEPENDING ON CLINICAL CONDITION.  Leia Alf Sarah Zerby M.D on 02/16/2018 at 10:30 AM  Between 7am to 6pm - Pager - [phone]  After 6pm go to [email or web address] - Proofreader  [facility]  Office  [phone]  CC: Primary care physician; Pleas Koch, NP

## 2018-02-17 ENCOUNTER — Other Ambulatory Visit: Payer: Self-pay | Admitting: Primary Care

## 2018-02-17 DIAGNOSIS — J441 Chronic obstructive pulmonary disease with (acute) exacerbation: Secondary | ICD-10-CM

## 2018-02-17 MED ORDER — POTASSIUM CHLORIDE ER 10 MEQ PO TBCR: 10.0000 meq | EXTENDED_RELEASE_TABLET | Freq: Every day | ORAL | 0 refills | Status: DC

## 2018-02-17 MED ORDER — FUROSEMIDE 10 MG/ML IJ SOLN: 60.0000 mg | Freq: Once | INTRAMUSCULAR | Status: DC

## 2018-02-17 MED ORDER — FUROSEMIDE 10 MG/ML IJ SOLN
60.0000 mg | Freq: Once | INTRAMUSCULAR | Status: AC
  Administered 2018-02-17: 60 mg via INTRAVENOUS
  Filled 2018-02-17: qty 6

## 2018-02-17 MED ORDER — LEVOFLOXACIN 500 MG PO TABS: 500.0000 mg | ORAL_TABLET | Freq: Every day | ORAL | 0 refills | Status: DC

## 2018-02-17 MED ORDER — FUROSEMIDE 40 MG PO TABS: 40.0000 mg | ORAL_TABLET | Freq: Every day | ORAL | 0 refills | Status: DC

## 2018-02-17 MED ORDER — FUROSEMIDE 40 MG PO TABS
60.0000 mg | ORAL_TABLET | Freq: Once | ORAL | Status: AC
  Administered 2018-02-17: 60 mg via ORAL
  Filled 2018-02-17: qty 2

## 2018-02-17 NOTE — Care Management Important Message (Signed)
Important Message  Patient Details  Name: Teresa Jensen MRN: [id number] Date of Birth: September 23, 1945   Medicare Important Message Given:  Yes    [name] 02/17/2018, 12:38 PM

## 2018-02-17 NOTE — Discharge Instructions (Signed)
°-   Daily fluids < 2 liters. - Low salt diet - Check weight everyday and keep log. Take to your doctors appt. - Take extra dose of lasix if you gain more than 3 pounds weight.

## 2018-02-17 NOTE — Progress Notes (Signed)
SATURATION QUALIFICATIONS: (This note is used to comply with regulatory documentation for home oxygen)  Patient Saturations on Room Air at Rest = 88%  Patient Saturations on Room Air while Ambulating = down to 84%%  Patient Saturations on 2-3 Liters of oxygen while Ambulating = 94-96%  Please briefly explain why patient needs home oxygen

## 2018-02-17 NOTE — Care Management (Signed)
Patient has qualified for oxygen. Provided with list of DME agencies [place].  No preference.  Referral to [facility].  Discussed with patient that this does not necessarily mean that she is going to need long term home oxygen. No other needs.

## 2018-02-18 ENCOUNTER — Telehealth: Payer: Self-pay | Admitting: Behavioral Health

## 2018-02-18 LAB — CULTURE, BLOOD (ROUTINE X 2)
Culture: NO GROWTH
Culture: NO GROWTH
Special Requests: ADEQUATE
Special Requests: ADEQUATE

## 2018-02-18 NOTE — Telephone Encounter (Signed)
Transition Care Management Follow-up Telephone Call   Date discharged? 02/17/2018   How have you been since you were released from the hospital? Patient stated, "I'm hanging in there".   Do you understand why you were in the hospital? yes   Do you understand the discharge instructions? yes   Where were you discharged to? Home with husband.   Items Reviewed:  Medications reviewed: yes  Allergies reviewed: yes  Dietary changes reviewed: yes, consuming regular diet.  Referrals reviewed: yes, follow-up with PCP and Cardiologist.   Functional Questionnaire:   Activities of Daily Living (ADLs):   She states they are independent in the following: ambulation, bathing and hygiene, feeding, continence, grooming, toileting and dressing States they require assistance with the following: None   Any transportation issues/concerns?: no   Any patient concerns? yes, patient would like to know if she's suppose to continue taking Lisinopril or not.   Confirmed importance and date/time of follow-up visits scheduled yes, 02/24/2018 at 12 PM.  Provider Appointment booked with Alma Friendly, NP.  Confirmed with patient if condition begins to worsen call PCP or go to the ER.  Patient was given the office number and encouraged to call back with question or concerns.  : yes

## 2018-02-18 NOTE — Telephone Encounter (Signed)
Noted  

## 2018-02-24 ENCOUNTER — Encounter: Payer: Self-pay | Admitting: Primary Care

## 2018-02-24 ENCOUNTER — Telehealth: Payer: Self-pay | Admitting: Primary Care

## 2018-02-24 ENCOUNTER — Ambulatory Visit (INDEPENDENT_AMBULATORY_CARE_PROVIDER_SITE_OTHER): Payer: Medicare Other | Admitting: Primary Care

## 2018-02-24 ENCOUNTER — Ambulatory Visit (INDEPENDENT_AMBULATORY_CARE_PROVIDER_SITE_OTHER)
Admission: RE | Admit: 2018-02-24 | Discharge: 2018-02-24 | Disposition: A | Discharge status: Home / Self Care | Payer: Medicare Other | Source: Ambulatory Visit | Attending: Primary Care | Admitting: Primary Care

## 2018-02-24 ENCOUNTER — Ambulatory Visit: Payer: Medicare Other | Admitting: Primary Care

## 2018-02-24 VITALS — BP 130/80 | HR 87 | Temp 98.2°F | Ht 59.0 in | Wt 177.8 lb

## 2018-02-24 DIAGNOSIS — J181 Lobar pneumonia, unspecified organism: Secondary | ICD-10-CM

## 2018-02-24 DIAGNOSIS — R0602 Shortness of breath: Secondary | ICD-10-CM | POA: Diagnosis not present

## 2018-02-24 DIAGNOSIS — E876 Hypokalemia: Secondary | ICD-10-CM

## 2018-02-24 DIAGNOSIS — J441 Chronic obstructive pulmonary disease with (acute) exacerbation: Secondary | ICD-10-CM

## 2018-02-24 DIAGNOSIS — J189 Pneumonia, unspecified organism: Secondary | ICD-10-CM

## 2018-02-24 DIAGNOSIS — Z09 Encounter for follow-up examination after completed treatment for conditions other than malignant neoplasm: Secondary | ICD-10-CM

## 2018-02-24 LAB — BASIC METABOLIC PANEL
BUN: 12 mg/dL (ref 6–23)
CO2: 31 mEq/L (ref 19–32)
Calcium: 9 mg/dL (ref 8.4–10.5)
Chloride: 98 mEq/L (ref 96–112)
Creatinine, Ser: 0.58 mg/dL (ref 0.40–1.20)
GFR: 101.92 mL/min (ref >60.00)
Glucose, Bld: 106 mg/dL — ABNORMAL HIGH (ref 70–99)
Potassium: 4.1 mEq/L (ref 3.5–5.1)
Sodium: 137 mEq/L (ref 135–145)

## 2018-02-24 LAB — BRAIN NATRIURETIC PEPTIDE: Pro B Natriuretic peptide (BNP): 99 pg/mL (ref 0.0–100.0)

## 2018-02-24 NOTE — Telephone Encounter (Signed)
See MyChart message

## 2018-02-24 NOTE — Assessment & Plan Note (Signed)
Diagnosed on 02/10/18 in our office, treated.  Symptoms persisted, also with COPD exacerbation.  Doing better since hospital stay, compliant to prescribed regimen. Will have her hold furosemide and potassium for now, repeat BMP and BNP today. Continue off of home oxygen for now as she refuses, use PRN.  Repeat chest xray pending. No acute distress today, appears much better. Return precautions provided.

## 2018-02-24 NOTE — Telephone Encounter (Signed)
Pt called to request the oxygen order to be cancelled with [facility]- [phone]. She tried to cancel but was told it had to be cancelled by her doctor.

## 2018-02-24 NOTE — Telephone Encounter (Signed)
Teresa Jensen, we will need a printed order to discontinue the oxygen which I fax the order.

## 2018-02-24 NOTE — Patient Instructions (Addendum)
Stop by the lab and xray prior to leaving today. I will notify you of your results once received.   Hold the furosemide and potassium pills for now, I'll be in touch soon.  Please update me if your symptoms return. Follow up with cardiology as scheduled.  It was a pleasure to see you today!

## 2018-02-24 NOTE — Progress Notes (Signed)
Subjective:    Patient ID: Teresa Jensen, female    DOB: 02-Jul-1945, 73 y.o.   MRN: [id number]  HPI  Teresa Jensen is a 73 year old female who presents today for hospital follow up.  She presented to our office on 02/10/18 with complaints of cough, fevers, fatigue. Work up revealed right upper lobe pneumonia so she was treated with Doxycycline. She messaged a few times reporting intolerance to Doxycycline so her medication was switched to Augmentin. We also instructed her to go to the ED if her symptoms persisted.   She presented to [facility] ED on 02/13/18 with continued cough, shortness of breath, fever. Her oxygen saturation was 77% on room air, improved to 90% on 2 liters of supplemental oxygen. Labs with elevated WBC count. She was admitted for further treatment.  During her hospital stay she was treated with IV steroids, antibiotics, fluids, nebulized treatments. She was also treated with oral potassium due to hypokalemia. BNP with elevation, echocardiogram completed with LVEF of 60-65%, grossly unremarkable. Repeat chest xray with increased interstitial edema so she was initiated on furosemide 40 mg daily. She was discharged home on 02/17/18 with home oxygen at 2 liters. Also with prescriptions for Levaquin 500 mg, potassium 10 mEq, and furosemide 40 mg daily. It was recommended she follow up with PCP and cardiology.   Since her hospital stay she has completed Levaquin as prescribed. She is compliant to furosemide 40 mg and potassium 10 mEq daily, has not had any doses this monring. She has an appointment with cardiology on April 22nd. She is no longer wearing her oxygen, stopped this morning due to nasal irritation and dryness. She denies shortness of breath, chest pain. Energy level is improving slowly. She has not had to use her albuterol inhaler. Residual cough that is improving.  Review of Systems  Constitutional: Positive for fatigue. Negative for fever.  Respiratory: Positive for cough.  Negative for shortness of breath.   Cardiovascular: Negative for chest pain.  Neurological: Negative for dizziness, light-headedness and headaches.       Past Medical History:  Diagnosis Date  . Anxiety   . CAP (community acquired pneumonia)   . COPD (chronic obstructive pulmonary disease) ([place])   . Epistaxis 01/09/2017  . GERD (gastroesophageal reflux disease)   . Hypertension   . New onset of headaches   . Osteopenia   . PMR (polymyalgia rheumatica) ([facility])   . Stasis dermatitis   . Strain of chest wall 01/20/2011  . Tobacco abuse      Social History   Socioeconomic History  . Marital status: Married    Spouse name: Not on file  . Number of children: 1  . Years of education: Not on file  . Highest education level: Not on file  Occupational History  . 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 Financial resource strain: Not on file  . Food insecurity:    Worry: Not on file    Inability: Not on file  . Transportation needs:    Medical: Not on file    Non-medical: Not on file  Tobacco Use  . Smoking status: Former Smoker    Packs/day: 1.50    Years: 40.00    Pack years: 60.00    Types: Cigarettes    Last attempt to quit: 02/11/2009    Years since quitting: 9.0  . Smokeless tobacco: Never Used  Substance and Sexual Activity  .  Alcohol use: No    Alcohol/week: 0.0 standard drinks  . Drug use: No  . Sexual activity: Never  Lifestyle  . Physical activity:    Days per week: Not on file    Minutes per session: Not on file  . Stress: Not on file  Relationships  . Social connections:    Talks on phone: Not on file    Gets together: Not on file    Attends religious service: Not on file    Active member of club or organization: Not on file    Attends meetings of clubs or organizations: Not on file    Relationship status: Not on file  . Intimate partner violence:    Fear of current or ex partner: Not  on file    Emotionally abused: Not on file    Physically abused: Not on file    Forced sexual activity: Not on file  Other Topics Concern  . Not on file  Social History Narrative   Lives in [place] with her husband.  Takes care of her two grandchildren, ages 60 and 2, everyday in [place].  Has one son who is 22.   Desires CPR   Would not want prolonged life support if futile    Past Surgical History:  Procedure Laterality Date  . ABDOMINAL HYSTERECTOMY    . APPENDECTOMY    . BACK SURGERY    . CARPAL TUNNEL RELEASE Left   . CERVICAL FUSION    . CHOLECYSTECTOMY    . HEMORRHOID SURGERY    . LUMBAR FUSION    . TUBAL LIGATION      Family History  Problem Relation Age of Onset  . Heart disease Mother   . Cancer Father        lung/chest wall   . Prostate cancer Father   . Diabetes Brother        siblings  . Hypertension Sister        x 2  . Heart disease Brother   . Breast cancer Paternal Grandmother   . Heart disease Sister   . Diabetes Sister   . Tuberculosis Sister   . Cirrhosis Brother     Allergies  Allergen Reactions  . Prednisone Anaphylaxis, Hives, Shortness Of Breath, Itching and Swelling    The face swells  . Bisphosphonates Other (See Comments)    Unsure  . Prevnar 13 [Pneumococcal 13-Val Conj Vacc] Other (See Comments)    Reaction not recalled by the patient  . Food Rash    MANGO = cause rashes around the mouth    Current Outpatient Medications on File Prior to Visit  Medication Sig Dispense Refill  . clobetasol cream (TEMOVATE) 0.08 % Apply 1 application topically 2 (two) times daily.    . furosemide (LASIX) 40 MG tablet Take 1 tablet (40 mg total) by mouth daily for 30 days. 30 tablet 0  . levothyroxine (SYNTHROID, LEVOTHROID) 75 MCG tablet TAKE 1 TABLET BY MOUTH EVERY MORNING ON EMPTY STOMACH WITH A FULL GLASS OF WATER. 90 tablet 3  . lisinopril (PRINIVIL,ZESTRIL) 5 MG tablet Take 1 tablet (5 mg total) by mouth daily. For blood pressure. 90  tablet 1  . potassium chloride (K-DUR) 10 MEQ tablet Take 1 tablet (10 mEq total) by mouth daily. 30 tablet 0  . albuterol (PROVENTIL HFA;VENTOLIN HFA) 108 (90 Base) MCG/ACT inhaler INHALE 2 PUFFS INTO THE LUNGS EVERY 4 (FOUR) HOURS AS NEEDED FOR SHORTNESS OF BREATH. (Patient not taking: Reported on 02/18/2018) 6.7  Inhaler 0  . ALPRAZolam (XANAX) 0.25 MG tablet Take 1 tablet by mouth daily as needed for anxiety. (Patient not taking: Reported on 02/18/2018) 20 tablet 0   No current facility-administered medications on file prior to visit.     BP 130/80   Pulse 87   Temp 98.2 F (36.8 C) (Oral)   Ht 4\' 11"  (1.499 m)   Wt 177 lb 12 oz (80.6 kg)   SpO2 92%   BMI 35.90 kg/m    Objective:   Physical Exam  Constitutional: She appears well-nourished. She does not appear ill.  HENT:  Right Ear: Tympanic membrane and ear canal normal.  Left Ear: Tympanic membrane and ear canal normal.  Nose: No mucosal edema. Right sinus exhibits no maxillary sinus tenderness and no frontal sinus tenderness. Left sinus exhibits no maxillary sinus tenderness and no frontal sinus tenderness.  Mouth/Throat: Oropharynx is clear and moist.  Neck: Neck supple.  Cardiovascular: Normal rate and regular rhythm.  Respiratory: Effort normal. She has no wheezes.  Decrease in breath sounds to right lower lobe, moving air throughout. No distress. 92% on room air.  Skin: Skin is warm and dry.           Assessment & Plan:

## 2018-02-24 NOTE — Assessment & Plan Note (Signed)
Occurred during recent pneumonia flare. Doing well, no recent use of albuterol. Continue to monitor.

## 2018-03-02 DIAGNOSIS — I10 Essential (primary) hypertension: Secondary | ICD-10-CM

## 2018-03-03 MED ORDER — LOSARTAN POTASSIUM 25 MG PO TABS: 25.0000 mg | ORAL_TABLET | Freq: Every day | ORAL | 0 refills | Status: DC

## 2018-03-03 NOTE — Telephone Encounter (Signed)
Teresa Jensen, we need to call her home supply company so they can pick up her home oxygen.  They need permission from [place] first.

## 2018-03-03 NOTE — Telephone Encounter (Signed)
Faxed to [facility] to discontinue oxygen

## 2018-03-04 NOTE — Discharge Summary (Signed)
[place] [facility] [place] NAME: Teresa Jensen    MR#:  [id number]  DATE OF BIRTH:  09-05-1945  DATE OF ADMISSION:  02/13/2018 ADMITTING PHYSICIAN: Hillary Bow, MD  DATE OF DISCHARGE: 02/17/2018  6:55 PM  PRIMARY CARE PHYSICIAN: Pleas Koch, NP   ADMISSION DIAGNOSIS:  Pneumonia  DISCHARGE DIAGNOSIS:  Active Problems:   Pneumonia   SECONDARY DIAGNOSIS:   Past Medical History:  Diagnosis Date  . Anxiety   . CAP (community acquired pneumonia)   . COPD (chronic obstructive pulmonary disease) ([place])   . Epistaxis 01/09/2017  . GERD (gastroesophageal reflux disease)   . Hypertension   . New onset of headaches   . Osteopenia   . PMR (polymyalgia rheumatica) ([facility])   . Stasis dermatitis   . Strain of chest wall 01/20/2011  . Tobacco abuse      ADMITTING HISTORY  HISTORY OF PRESENT ILLNESS:  Teresa Jensen  is a 73 y.o. female with a known history of COPD, polymyalgia rheumatica, hypertension presents to the emergency room due to worsening shortness of breath and weakness in spite of being on antibiotics for right upper lobe pneumonia.  Patient was started on doxycycline which she could not tolerate.  This was changed to Augmentin which she has taken for 2 days.  Due to worsening symptoms presents to the ED.  Saturations are 77% on room air and 90% on 2 L oxygen.  Chest x-ray continues to show right upper lobe pneumonia.  Elevated white count.  Has not been on steroids.   HOSPITAL COURSE:   73 year old female with past medical history significant for COPD, GERD, hypertension, polymyalgia rheumatica presents to hospital secondary to worsening shortness of breath  *  Community-acquired pneumonia-was on outpatient doxycycline, chest x-ray with right infiltrate -Still requiring 2 L oxygen which is acute.  Continued to wean off oxygen.    Patient desats on ambulation and was set up with home oxygen at discharge. -On Rocephin and  azithromycin.  Changed to Levaquin at discharge. -Nebulizers and inhalers for mild reactive airway disease. -Influenza test recently negative Cultures negative  *Acute on chronic diastolic congestive heart failure.  Chest x-ray showed pulmonary edema, normal ejection fraction on echocardiogram.  Elevated BNP.  Treated with IV Lasix in the hospital and discharged home on oral Lasix.  Cardiology follow-up as outpatient.  *  Hypokalemia- replaced  *  Hypothyroidism-continue Synthroid  *  COPD-much improved breathing.  No wheezing noted.   Off steroids  *  DVT prophylaxis-Lovenox  Patient discharged home in stable condition with oral Levaquin, Lasix and PCP/cardiology follow-up. Home oxygen  CONSULTS OBTAINED:    DRUG ALLERGIES:   Allergies  Allergen Reactions  . Prednisone Anaphylaxis, Hives, Shortness Of Breath, Itching and Swelling    The face swells  . Bisphosphonates Other (See Comments)    Unsure  . Prevnar 13 [Pneumococcal 13-Val Conj Vacc] Other (See Comments)    Reaction not recalled by the patient  . Food Rash    MANGO = cause rashes around the mouth    DISCHARGE MEDICATIONS:   Allergies as of 02/17/2018      Reactions   Prednisone Anaphylaxis, Hives, Shortness Of Breath, Itching, Swelling   The face swells   Bisphosphonates Other (See Comments)   Unsure   Prevnar 13 [pneumococcal 13-val Conj Vacc] Other (See Comments)   Reaction not recalled by the patient   Food Rash   MANGO = cause rashes around the mouth  Medication List    STOP taking these medications   amoxicillin-clavulanate 875-125 MG tablet Commonly known as:  AUGMENTIN     TAKE these medications   albuterol 108 (90 Base) MCG/ACT inhaler Commonly known as:  PROVENTIL HFA;VENTOLIN HFA INHALE 2 PUFFS INTO THE LUNGS EVERY 4 (FOUR) HOURS AS NEEDED FOR SHORTNESS OF BREATH.   ALPRAZolam 0.25 MG tablet Commonly known as:  XANAX Take 1 tablet by mouth daily as needed for anxiety.    clobetasol cream 0.05 % Commonly known as:  TEMOVATE Apply 1 application topically 2 (two) times daily.   furosemide 40 MG tablet Commonly known as:  LASIX Take 1 tablet (40 mg total) by mouth daily for 30 days.   levothyroxine 75 MCG tablet Commonly known as:  SYNTHROID, LEVOTHROID TAKE 1 TABLET BY MOUTH EVERY MORNING ON EMPTY STOMACH WITH A FULL GLASS OF WATER.   potassium chloride 10 MEQ tablet Commonly known as:  K-DUR Take 1 tablet (10 mEq total) by mouth daily.       Today   VITAL SIGNS:  Blood pressure (!) 114/50, pulse 87, temperature 97.8 F (36.6 C), temperature source Oral, resp. rate 18, height 4\' 11"  (1.499 m), weight 82.8 kg, SpO2 96 %.  I/O:  No intake or output data in the 24 hours ending 03/04/18 1449  PHYSICAL EXAMINATION:  Physical Exam  GENERAL:  73 y.o.-year-old patient lying in the bed with no acute distress.  LUNGS: Normal breath sounds bilaterally, no wheezing, rales,rhonchi or crepitation. No use of accessory muscles of respiration.  CARDIOVASCULAR: S1, S2 normal. No murmurs, rubs, or gallops.  ABDOMEN: Soft, non-tender, non-distended. Bowel sounds present. No organomegaly or mass.  NEUROLOGIC: Moves all 4 extremities. PSYCHIATRIC: The patient is alert and oriented x 3.  SKIN: No obvious rash, lesion, or ulcer.   DATA REVIEW:   CBC No results for input(s): WBC, HGB, HCT, PLT in the last 168 hours.  Chemistries  No results for input(s): NA, K, CL, CO2, GLUCOSE, BUN, CREATININE, CALCIUM, MG, AST, ALT, ALKPHOS, BILITOT in the last 168 hours.  Invalid input(s): GFRCGP  Cardiac Enzymes No results for input(s): TROPONINI in the last 168 hours.  Microbiology Results  Results for orders placed or performed during the hospital encounter of 02/13/18  Blood Culture (routine x 2)     Status: None   Collection Time: 02/13/18  3:37 PM  Result Value Ref Range Status   Specimen Description BLOOD BLOOD LEFT FOREARM  Final   Special Requests   Final     BOTTLES DRAWN AEROBIC AND ANAEROBIC Blood Culture adequate volume   Culture   Final    NO GROWTH 5 DAYS Performed at [facility], [address], [place], [place] [address]    Report Status 02/18/2018 FINAL  Final  Blood Culture (routine x 2)     Status: None   Collection Time: 02/13/18  3:37 PM  Result Value Ref Range Status   Specimen Description BLOOD BLOOD RIGHT FOREARM  Final   Special Requests   Final    BOTTLES DRAWN AEROBIC AND ANAEROBIC Blood Culture adequate volume   Culture   Final    NO GROWTH 5 DAYS Performed at [facility], [address], [place],  [address]    Report Status 02/18/2018 FINAL  Final    RADIOLOGY:  No results found.  Follow up with PCP in 1 week.  Management plans discussed with the patient, family and they are in agreement.  CODE STATUS:  Code  Status History    Date Active Date Inactive Code Status Order ID Comments User Context   02/13/2018 1659 02/17/2018 2200 Full Code [id number]  Hillary Bow, MD ED   01/12/2017 0008 01/14/2017 1707 Full Code [id number]  Etta Quill, DO ED   01/08/2017 1935 01/10/2017 1628 Full Code [id number]  Elmarie Shiley, MD ED   01/07/2017 0043 01/07/2017 1458 Full Code [id number]  Rise Patience, MD ED      TOTAL TIME TAKING CARE OF THIS PATIENT ON DAY OF DISCHARGE: more than 30 minutes.   Leia Alf Tully Burgo M.D on 03/04/2018 at 2:49 PM  Between 7am to 6pm - Pager - [phone]  After 6pm go to [email or web address] - password [email or web address]  Office  [phone]  CC: Primary care physician; Pleas Koch, NP  Note: This dictation was prepared with Dragon dictation along with smaller phrase technology. Any transcriptional errors that result from this process are unintentional.

## 2018-03-14 ENCOUNTER — Encounter: Payer: Self-pay | Admitting: Primary Care

## 2018-03-14 ENCOUNTER — Other Ambulatory Visit: Payer: Self-pay

## 2018-03-14 ENCOUNTER — Ambulatory Visit (INDEPENDENT_AMBULATORY_CARE_PROVIDER_SITE_OTHER): Payer: Medicare Other | Admitting: Primary Care

## 2018-03-14 VITALS — BP 126/80 | HR 82 | Temp 98.2°F | Ht 59.0 in | Wt 179.5 lb

## 2018-03-14 DIAGNOSIS — J302 Other seasonal allergic rhinitis: Secondary | ICD-10-CM | POA: Diagnosis not present

## 2018-03-14 NOTE — Progress Notes (Signed)
Subjective:    Patient ID: Teresa Jensen, female    DOB: 10-Sep-1945, 73 y.o.   MRN: [id number]  HPI  Teresa Jensen is a 73 year old female with a history of COPD, pneumonia, hypertension who presents today with a chief complaint of cough, post nasal drip, and congestion.   She was last evaluated on 02/24/18 for hospital follow up as she was admitted for right upper lobe pneumonia. Since her last visit she's feeling better, some residual discomfort to the right lung but is much better. She denies fevers, sore throat, sick contacts, recent travel, shortness of breath, exposure to Covid-19. She's here today as she wants to make sure that her lungs are clear.  Review of Systems  Constitutional: Negative for fatigue and fever.  HENT: Positive for congestion.   Respiratory: Positive for cough. Negative for shortness of breath.   Cardiovascular: Negative for chest pain.       Past Medical History:  Diagnosis Date  . Anxiety   . CAP (community acquired pneumonia)   . COPD (chronic obstructive pulmonary disease) ([place])   . Epistaxis 01/09/2017  . GERD (gastroesophageal reflux disease)   . Hypertension   . New onset of headaches   . Osteopenia   . PMR (polymyalgia rheumatica) ([facility])   . Stasis dermatitis   . Strain of chest wall 01/20/2011  . Tobacco abuse      Social History   Socioeconomic History  . Marital status: Married    Spouse name: Not on file  . Number of children: 1  . Years of education: Not on file  . Highest education level: Not on file  Occupational History  . 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 Financial resource strain: Not on file  . Food insecurity:    Worry: Not on file    Inability: Not on file  . Transportation needs:    Medical: Not on file    Non-medical: Not on file  Tobacco Use  . Smoking status: Former Smoker    Packs/day: 1.50    Years: 40.00    Pack years: 60.00    Types:  Cigarettes    Last attempt to quit: 02/11/2009    Years since quitting: 9.0  . Smokeless tobacco: Never Used  Substance and Sexual Activity  . Alcohol use: No    Alcohol/week: 0.0 standard drinks  . Drug use: No  . Sexual activity: Never  Lifestyle  . Physical activity:    Days per week: Not on file    Minutes per session: Not on file  . Stress: Not on file  Relationships  . Social connections:    Talks on phone: Not on file    Gets together: Not on file    Attends religious service: Not on file    Active member of club or organization: Not on file    Attends meetings of clubs or organizations: Not on file    Relationship status: Not on file  . Intimate partner violence:    Fear of current or ex partner: Not on file    Emotionally abused: Not on file    Physically abused: Not on file    Forced sexual activity: Not on file  Other Topics Concern  . Not on file  Social History Narrative   Lives in [place] with her husband.  Takes care of her two grandchildren, ages 82 and 2, everyday in  [place].  Has one son who is 46.   Desires CPR   Would not want prolonged life support if futile    Past Surgical History:  Procedure Laterality Date  . ABDOMINAL HYSTERECTOMY    . APPENDECTOMY    . BACK SURGERY    . CARPAL TUNNEL RELEASE Left   . CERVICAL FUSION    . CHOLECYSTECTOMY    . HEMORRHOID SURGERY    . LUMBAR FUSION    . TUBAL LIGATION      Family History  Problem Relation Age of Onset  . Heart disease Mother   . Cancer Father        lung/chest wall   . Prostate cancer Father   . Diabetes Brother        siblings  . Hypertension Sister        x 2  . Heart disease Brother   . Breast cancer Paternal Grandmother   . Heart disease Sister   . Diabetes Sister   . Tuberculosis Sister   . Cirrhosis Brother     Allergies  Allergen Reactions  . Prednisone Anaphylaxis, Hives, Shortness Of Breath, Itching and Swelling    The face swells  . Bisphosphonates Other (See  Comments)    Unsure  . Prevnar 13 [Pneumococcal 13-Val Conj Vacc] Other (See Comments)    Reaction not recalled by the patient  . Food Rash    MANGO = cause rashes around the mouth    Current Outpatient Medications on File Prior to Visit  Medication Sig Dispense Refill  . clobetasol cream (TEMOVATE) 4.76 % Apply 1 application topically 2 (two) times daily.    . furosemide (LASIX) 40 MG tablet Take 1 tablet (40 mg total) by mouth daily for 30 days. 30 tablet 0  . levothyroxine (SYNTHROID, LEVOTHROID) 75 MCG tablet TAKE 1 TABLET BY MOUTH EVERY MORNING ON EMPTY STOMACH WITH A FULL GLASS OF WATER. 90 tablet 3  . losartan (COZAAR) 25 MG tablet Take 1 tablet (25 mg total) by mouth daily. For blood pressure. 90 tablet 0  . potassium chloride (K-DUR) 10 MEQ tablet Take 1 tablet (10 mEq total) by mouth daily. 30 tablet 0  . albuterol (PROVENTIL HFA;VENTOLIN HFA) 108 (90 Base) MCG/ACT inhaler INHALE 2 PUFFS INTO THE LUNGS EVERY 4 (FOUR) HOURS AS NEEDED FOR SHORTNESS OF BREATH. (Patient not taking: Reported on 03/14/2018) 6.7 Inhaler 0  . ALPRAZolam (XANAX) 0.25 MG tablet Take 1 tablet by mouth daily as needed for anxiety. (Patient not taking: Reported on 03/14/2018) 20 tablet 0   No current facility-administered medications on file prior to visit.     BP 126/80   Pulse 82   Temp 98.2 F (36.8 C) (Oral)   Ht 4\' 11"  (1.499 m)   Wt 179 lb 8 oz (81.4 kg)   SpO2 93%   BMI 36.25 kg/m    Objective:   Physical Exam  Constitutional: She appears well-nourished. She does not appear ill.  HENT:  Right Ear: Tympanic membrane and ear canal normal.  Left Ear: Tympanic membrane and ear canal normal.  Nose: No mucosal edema. Right sinus exhibits no maxillary sinus tenderness and no frontal sinus tenderness. Left sinus exhibits no maxillary sinus tenderness and no frontal sinus tenderness.  Mouth/Throat: Oropharynx is clear and moist.  Neck: Neck supple.  Cardiovascular: Normal rate and regular rhythm.   Respiratory: Effort normal and breath sounds normal. She has no wheezes.  Skin: Skin is warm and dry.  Assessment & Plan:  Post Nasal Drip:  Also with cough and congestion. Doesn't appear sickly. Low risk for Covid-19, lungs clear. Suspect more allergy involvement given exam and HPI. Discussed to notify if she developed SOB, fevers, increased cough. Discussed OTC treatment.  Pleas Koch, NP

## 2018-03-14 NOTE — Patient Instructions (Signed)
You can try Zyrtec at bedtime for throat drainage and congestion.  Please notify me if you develop persistent fevers, notice shortness of breath, and/or feel worse after 1 week of onset of symptoms.   Increase consumption of water intake and rest.  It was a pleasure to see you today!

## 2018-04-07 ENCOUNTER — Telehealth: Payer: Self-pay | Admitting: *Deleted

## 2018-04-07 NOTE — Telephone Encounter (Signed)
Patient calling back. States she is not having any cardiac symptoms as listed below at this time. She does not feel she needs the visit at this time during the Covid 19 crisis. However, she would like to be seen eventually in the office once we are seeing patients again. She was very understanding and did not want to come out in public unnecessarily. Appointment cancelled and transferred to [place] pool.      Cardiac Questionnaire:    Since your last visit or hospitalization:    1. Have you been having new or worsening chest pain? NO   2. Have you been having new or worsening shortness of breath? NO 3. Have you been having new or worsening leg swelling, wt gain, or increase in abdominal girth (pants fitting more tightly)? NO   4. Have you had any passing out spells? NO    *A YES to any of these questions would result in the appointment being kept. *If all the answers to these questions are NO, we should indicate that given the current situation regarding the worldwide coronarvirus pandemic, at the recommendation of the CDC, we are looking to limit gatherings in our waiting area, and thus will reschedule their appointment beyond four weeks from today.   _____________

## 2018-04-07 NOTE — Telephone Encounter (Signed)
New patient appointment 04/23/18  With Dr End. Called to assess symptoms and see if ok to defer. No answer. Left message to call back.

## 2018-04-11 NOTE — Telephone Encounter (Signed)
Referral in wq.  Will fu at a later time

## 2018-04-17 ENCOUNTER — Ambulatory Visit
Admission: RE | Admit: 2018-04-17 | Discharge: 2018-04-17 | Disposition: A | Discharge status: Home / Self Care | Payer: Medicare Other | Source: Ambulatory Visit | Attending: Family Medicine | Admitting: Family Medicine

## 2018-04-17 ENCOUNTER — Ambulatory Visit (INDEPENDENT_AMBULATORY_CARE_PROVIDER_SITE_OTHER): Payer: Medicare Other | Admitting: Family Medicine

## 2018-04-17 ENCOUNTER — Other Ambulatory Visit: Payer: Self-pay

## 2018-04-17 ENCOUNTER — Encounter: Payer: Self-pay | Admitting: Family Medicine

## 2018-04-17 VITALS — BP 160/80 | HR 91 | Temp 97.7°F | Ht 59.0 in | Wt 177.2 lb

## 2018-04-17 DIAGNOSIS — M7989 Other specified soft tissue disorders: Secondary | ICD-10-CM | POA: Insufficient documentation

## 2018-04-17 DIAGNOSIS — I10 Essential (primary) hypertension: Secondary | ICD-10-CM

## 2018-04-17 DIAGNOSIS — M79605 Pain in left leg: Secondary | ICD-10-CM | POA: Insufficient documentation

## 2018-04-17 NOTE — Progress Notes (Signed)
Subjective:     Teresa Jensen is a 73 y.o. female presenting for Spots on Leg     HPI  #spot on leg - just noticed it today - no pain - not itchy - not sure what it is and wanted it check out - is anxious about it - this may be why her bp is up -    Review of Systems  Constitutional: Negative for chills and fever.  Respiratory: Negative for cough and shortness of breath.   Cardiovascular: Positive for leg swelling. Negative for chest pain.  Gastrointestinal: Negative for abdominal pain, nausea and vomiting.  Neurological: Negative for weakness and numbness.     Social History   Tobacco Use  Smoking Status Former Smoker  . Packs/day: 1.50  . Years: 40.00  . Pack years: 60.00  . Types: Cigarettes  . Last attempt to quit: 02/11/2009  . Years since quitting: 9.1  Smokeless Tobacco Never Used        Objective:    BP Readings from Last 3 Encounters:  04/17/18 (!) 160/80  03/14/18 126/80  02/24/18 130/80   Wt Readings from Last 3 Encounters:  04/17/18 177 lb 4 oz (80.4 kg)  03/14/18 179 lb 8 oz (81.4 kg)  02/24/18 177 lb 12 oz (80.6 kg)    BP (!) 160/80   Pulse 91   Temp 97.7 F (36.5 C) (Oral)   Ht 4\' 11"  (1.499 m)   Wt 177 lb 4 oz (80.4 kg)   BMI 35.80 kg/m    Physical Exam Constitutional:      General: She is not in acute distress.    Appearance: She is well-developed. She is not diaphoretic.  HENT:     Right Ear: External ear normal.     Left Ear: External ear normal.     Nose: Nose normal.  Eyes:     Conjunctiva/sclera: Conjunctivae normal.  Neck:     Musculoskeletal: Neck supple.  Cardiovascular:     Rate and Rhythm: Normal rate and regular rhythm.     Pulses: Normal pulses.     Heart sounds: No murmur.  Pulmonary:     Effort: Pulmonary effort is normal. No respiratory distress.     Breath sounds: No wheezing or rhonchi.  Musculoskeletal:     Comments: Trace RLE edema. 1+ left LE edema. Left localized swelling on the medial to  posterior calf which is tender to palpation. Cool to touch w/o any erythema or skin changes.   Skin:    General: Skin is warm and dry.     Capillary Refill: Capillary refill takes less than 2 seconds.  Neurological:     Mental Status: She is alert. Mental status is at baseline.  Psychiatric:        Mood and Affect: Mood normal.        Behavior: Behavior normal.           Assessment & Plan:   Problem List Items Addressed This Visit      Cardiovascular and Mediastinum   Essential hypertension    BP elevated on repeat, though difficult to hear ~140/90. Recommended home monitoring and to call back if elevated next week.         Other   Left leg pain - Primary    No sign of infection, but there is localized swelling and TTP w/o trauma concerning for possible blood clot. Urgent US ordered.       Relevant Orders   US Venous  Img Lower Unilateral Left    Other Visit Diagnoses    Left leg swelling       Relevant Orders   US Venous Img Lower Unilateral Left       Return if symptoms worsen or fail to improve.  Lesleigh Noe, MD

## 2018-04-17 NOTE — Assessment & Plan Note (Signed)
No sign of infection, but there is localized swelling and TTP w/o trauma concerning for possible blood clot. Urgent US ordered.

## 2018-04-17 NOTE — Assessment & Plan Note (Signed)
BP elevated on repeat, though difficult to hear ~140/90. Recommended home monitoring and to call back if elevated next week.

## 2018-04-17 NOTE — Patient Instructions (Signed)
Meet with Rosaria Ferries to get schedule for the ultrasound.   Your blood pressure high.   High blood pressure increases your risk for heart attack and stroke.    Please check your blood pressure 2-4 times a week.   To check your blood pressure 1) Sit in a quiet and relaxed place for 5 minutes 2) Make sure your feet are flat on the ground 3) Consider checking first thing in the morning   Normal blood pressure is less than 140/90 Ideally you blood pressure should be around 120/80   CALL KATE next week if your blood pressure remains high at home  Other ways you can reduce your blood pressure:  1) Regular exercise -- Try to get 150 minutes (30 minutes, 5 days a week) of moderate to vigorous aerobic excercise -- Examples: brisk walking (2.5 miles per hour), water aerobics, dancing, gardening, tennis, biking slower than 10 miles per hour 2) DASH Diet - low fat meats, more fresh fruits and vegetables, whole grains, low salt 3) Quit smoking if you smoke 4) Loose 5-10% of your body weight

## 2018-04-18 ENCOUNTER — Ambulatory Visit: Payer: Medicare Other | Admitting: Primary Care

## 2018-04-23 ENCOUNTER — Ambulatory Visit: Payer: Medicare Other | Admitting: Internal Medicine

## 2018-05-28 DIAGNOSIS — Z1211 Encounter for screening for malignant neoplasm of colon: Secondary | ICD-10-CM

## 2018-05-29 ENCOUNTER — Other Ambulatory Visit: Payer: Self-pay | Admitting: Primary Care

## 2018-05-29 DIAGNOSIS — I10 Essential (primary) hypertension: Secondary | ICD-10-CM

## 2018-06-09 ENCOUNTER — Telehealth: Payer: Self-pay | Admitting: Gastroenterology

## 2018-06-09 NOTE — Telephone Encounter (Signed)
Patient returned called to schedule an appointment  For a colonoscopy.

## 2018-06-10 ENCOUNTER — Other Ambulatory Visit: Payer: Self-pay | Admitting: Primary Care

## 2018-06-10 ENCOUNTER — Other Ambulatory Visit: Payer: Self-pay

## 2018-06-10 DIAGNOSIS — Z1231 Encounter for screening mammogram for malignant neoplasm of breast: Secondary | ICD-10-CM

## 2018-06-10 DIAGNOSIS — Z1211 Encounter for screening for malignant neoplasm of colon: Secondary | ICD-10-CM

## 2018-06-10 DIAGNOSIS — Z1239 Encounter for other screening for malignant neoplasm of breast: Secondary | ICD-10-CM

## 2018-06-10 NOTE — Telephone Encounter (Signed)
Gastroenterology Pre-Procedure Review  Request Date: 06/25/18 Requesting Physician: Dr. Bonna Gains  PATIENT REVIEW QUESTIONS: The patient responded to the following health history questions as indicated:    1. Are you having any GI issues? No 2. Do you have a personal history of Polyps? No 3. Do you have a family history of Colon Cancer or Polyps?No 4. Diabetes Mellitus?No 5. Joint replacements in the past 12 months?no 6. Major health problems in the past 3 months?No 7. Any artificial heart valves, MVP, or defibrillator?No    MEDICATIONS & ALLERGIES:    Patient reports the following regarding taking any anticoagulation/antiplatelet therapy:   Plavix, Coumadin, Eliquis, Xarelto, Lovenox, Pradaxa, Brilinta, or Effient? No Aspirin? No  Patient confirms/reports the following medications:  Current Outpatient Medications  Medication Sig Dispense Refill  . albuterol (PROVENTIL HFA;VENTOLIN HFA) 108 (90 Base) MCG/ACT inhaler INHALE 2 PUFFS INTO THE LUNGS EVERY 4 (FOUR) HOURS AS NEEDED FOR SHORTNESS OF BREATH. 6.7 Inhaler 0  . ALPRAZolam (XANAX) 0.25 MG tablet Take 1 tablet by mouth daily as needed for anxiety. 20 tablet 0  . clobetasol cream (TEMOVATE) 3.43 % Apply 1 application topically 2 (two) times daily.    Marland Kitchen levothyroxine (SYNTHROID, LEVOTHROID) 75 MCG tablet TAKE 1 TABLET BY MOUTH EVERY MORNING ON EMPTY STOMACH WITH A FULL GLASS OF WATER. 90 tablet 3  . losartan (COZAAR) 25 MG tablet TAKE 1 TABLET BY MOUTH EVERY DAY FOR BLOOD PRESSURE 90 tablet 1   No current facility-administered medications for this visit.     Patient confirms/reports the following allergies:  Allergies  Allergen Reactions  . Prednisone Anaphylaxis, Hives, Shortness Of Breath, Itching and Swelling    The face swells  . Bisphosphonates Other (See Comments)    Unsure  . Prevnar 13 [Pneumococcal 13-Val Conj Vacc] Other (See Comments)    Reaction not recalled by the patient  . Food Rash    MANGO = cause rashes  around the mouth    No orders of the defined types were placed in this encounter.   AUTHORIZATION INFORMATION Primary Insurance: 1D#: Group #:  Secondary Insurance: 1D#: Group #:  SCHEDULE INFORMATION: Date: 06/25/18 Time: Location:[facility]

## 2018-06-11 ENCOUNTER — Telehealth: Payer: Self-pay | Admitting: Gastroenterology

## 2018-06-11 NOTE — Telephone Encounter (Signed)
Patient called & l/m on v/m  ask for Sharyn Lull to call her.

## 2018-06-12 NOTE — Telephone Encounter (Signed)
Teresa Jensen, can we also cancel the referral to GI for her colonoscopy? Both she and her husband will be seeing another GI provider.

## 2018-06-12 NOTE — Telephone Encounter (Signed)
Patient has contacted the office to request to cancel her Colonoscopy.  She said she is not due and plans to go back to [facility] when its due.  She was pleasant during the call and apologized for any inconvenience.  Thanks [employer]

## 2018-06-25 ENCOUNTER — Ambulatory Visit: Admit: 2018-06-25 | Payer: Medicare Other | Admitting: Gastroenterology

## 2018-06-25 SURGERY — COLONOSCOPY WITH PROPOFOL
Anesthesia: General

## 2018-06-30 ENCOUNTER — Ambulatory Visit
Admission: RE | Admit: 2018-06-30 | Discharge: 2018-06-30 | Disposition: A | Discharge status: Home / Self Care | Payer: Medicare Other | Source: Ambulatory Visit | Attending: Primary Care | Admitting: Primary Care

## 2018-06-30 ENCOUNTER — Other Ambulatory Visit: Payer: Self-pay

## 2018-06-30 DIAGNOSIS — Z1231 Encounter for screening mammogram for malignant neoplasm of breast: Secondary | ICD-10-CM | POA: Insufficient documentation

## 2018-10-01 ENCOUNTER — Ambulatory Visit: Payer: Medicare Other | Admitting: Internal Medicine

## 2018-10-17 ENCOUNTER — Telehealth: Payer: Self-pay | Admitting: Internal Medicine

## 2018-10-17 NOTE — Telephone Encounter (Signed)
Patient got a call from the office about a cancellation and an opening to fill an appointment sooner than the one she is currently scheduled for. She said that the message told her there were openings on 10-26 and 10-27, but Dr. Saunders Revel is not in the clinic that day. I offered her 10-29 but she declined.  Please call to set up appt.

## 2018-10-24 ENCOUNTER — Ambulatory Visit (INDEPENDENT_AMBULATORY_CARE_PROVIDER_SITE_OTHER): Payer: Medicare Other | Admitting: Primary Care

## 2018-10-24 ENCOUNTER — Other Ambulatory Visit: Payer: Self-pay

## 2018-10-24 ENCOUNTER — Encounter: Payer: Self-pay | Admitting: Primary Care

## 2018-10-24 VITALS — BP 138/84 | HR 100 | Temp 97.4°F | Ht 59.0 in | Wt 180.5 lb

## 2018-10-24 DIAGNOSIS — K219 Gastro-esophageal reflux disease without esophagitis: Secondary | ICD-10-CM

## 2018-10-24 DIAGNOSIS — Z1211 Encounter for screening for malignant neoplasm of colon: Secondary | ICD-10-CM

## 2018-10-24 DIAGNOSIS — Z23 Encounter for immunization: Secondary | ICD-10-CM | POA: Diagnosis not present

## 2018-10-24 DIAGNOSIS — I10 Essential (primary) hypertension: Secondary | ICD-10-CM

## 2018-10-24 DIAGNOSIS — E559 Vitamin D deficiency, unspecified: Secondary | ICD-10-CM | POA: Diagnosis not present

## 2018-10-24 DIAGNOSIS — L539 Erythematous condition, unspecified: Secondary | ICD-10-CM

## 2018-10-24 DIAGNOSIS — D649 Anemia, unspecified: Secondary | ICD-10-CM

## 2018-10-24 DIAGNOSIS — M1990 Unspecified osteoarthritis, unspecified site: Secondary | ICD-10-CM

## 2018-10-24 DIAGNOSIS — F418 Other specified anxiety disorders: Secondary | ICD-10-CM

## 2018-10-24 DIAGNOSIS — M810 Age-related osteoporosis without current pathological fracture: Secondary | ICD-10-CM

## 2018-10-24 DIAGNOSIS — J449 Chronic obstructive pulmonary disease, unspecified: Secondary | ICD-10-CM

## 2018-10-24 DIAGNOSIS — Z0000 Encounter for general adult medical examination without abnormal findings: Secondary | ICD-10-CM

## 2018-10-24 DIAGNOSIS — E538 Deficiency of other specified B group vitamins: Secondary | ICD-10-CM | POA: Diagnosis not present

## 2018-10-24 DIAGNOSIS — E038 Other specified hypothyroidism: Secondary | ICD-10-CM

## 2018-10-24 DIAGNOSIS — R7989 Other specified abnormal findings of blood chemistry: Secondary | ICD-10-CM

## 2018-10-24 DIAGNOSIS — R7303 Prediabetes: Secondary | ICD-10-CM | POA: Diagnosis not present

## 2018-10-24 LAB — CBC WITH DIFFERENTIAL/PLATELET
Basophils Absolute: 0 10*3/uL (ref 0.0–0.1)
Basophils Relative: 0.2 % (ref 0.0–3.0)
Eosinophils Absolute: 0.1 10*3/uL (ref 0.0–0.7)
Eosinophils Relative: 0.7 % (ref 0.0–5.0)
HCT: 44.3 % (ref 36.0–46.0)
Hemoglobin: 14.6 g/dL (ref 12.0–15.0)
Lymphocytes Relative: 29.3 % (ref 12.0–46.0)
Lymphs Abs: 3.1 10*3/uL (ref 0.7–4.0)
MCHC: 33 g/dL (ref 30.0–36.0)
MCV: 90 fl (ref 78.0–100.0)
Monocytes Absolute: 0.8 10*3/uL (ref 0.1–1.0)
Monocytes Relative: 7.3 % (ref 3.0–12.0)
Neutro Abs: 6.5 10*3/uL (ref 1.4–7.7)
Neutrophils Relative %: 62.5 % (ref 43.0–77.0)
Platelets: 306 10*3/uL (ref 150.0–400.0)
RBC: 4.93 Mil/uL (ref 3.87–5.11)
RDW: 15 % (ref 11.5–15.5)
WBC: 10.5 10*3/uL (ref 4.0–10.5)

## 2018-10-24 LAB — LIPID PANEL
Cholesterol: 167 mg/dL (ref 0–200)
HDL: 43.9 mg/dL (ref >39.00)
LDL Cholesterol: 95 mg/dL (ref 0–99)
NonHDL: 123.41
Total CHOL/HDL Ratio: 4
Triglycerides: 143 mg/dL (ref 0.0–149.0)
VLDL: 28.6 mg/dL (ref 0.0–40.0)

## 2018-10-24 LAB — COMPREHENSIVE METABOLIC PANEL
ALT: 25 U/L (ref 0–35)
AST: 23 U/L (ref 0–37)
Albumin: 4.1 g/dL (ref 3.5–5.2)
Alkaline Phosphatase: 74 U/L (ref 39–117)
BUN: 12 mg/dL (ref 6–23)
CO2: 30 mEq/L (ref 19–32)
Calcium: 9.2 mg/dL (ref 8.4–10.5)
Chloride: 99 mEq/L (ref 96–112)
Creatinine, Ser: 0.73 mg/dL (ref 0.40–1.20)
GFR: 78.02 mL/min (ref >60.00)
Glucose, Bld: 158 mg/dL — ABNORMAL HIGH (ref 70–99)
Potassium: 4 mEq/L (ref 3.5–5.1)
Sodium: 137 mEq/L (ref 135–145)
Total Bilirubin: 0.4 mg/dL (ref 0.2–1.2)
Total Protein: 7.6 g/dL (ref 6.0–8.3)

## 2018-10-24 LAB — VITAMIN D 25 HYDROXY (VIT D DEFICIENCY, FRACTURES): VITD: 16.4 ng/mL — ABNORMAL LOW (ref 30.00–100.00)

## 2018-10-24 LAB — HEMOGLOBIN A1C: Hgb A1c MFr Bld: 6.1 % (ref 4.6–6.5)

## 2018-10-24 LAB — TSH: TSH: 1.72 u[IU]/mL (ref 0.35–4.50)

## 2018-10-24 LAB — VITAMIN B12: Vitamin B-12: 213 pg/mL (ref 211–911)

## 2018-10-24 LAB — SEDIMENTATION RATE: Sed Rate: 82 mm/hr — ABNORMAL HIGH (ref 0–30)

## 2018-10-24 LAB — C-REACTIVE PROTEIN: CRP: 1.1 mg/dL (ref 0.5–20.0)

## 2018-10-24 NOTE — Assessment & Plan Note (Signed)
Immunizations UTD per patient, insurance will not cover Shingrix. Colonoscopy due, patient declines and opts for Cologuard which was sent. Bone density and mammogram UTD. Recommended to start exercising, continue to work on diet. Exam today stable. Labs pending. All recommendations provided at end of visit.   I have personally reviewed and have noted: 1. The patient's medical and social history 2. Their use of alcohol, tobacco or illicit drugs 3. Their current medications and supplements 4. The patient's functional ability including ADL's, fall risks, home  safety risks and hearing or visual impairment. 5. Diet and physical activities 6. Evidence for depression or mood disorder

## 2018-10-24 NOTE — Addendum Note (Signed)
Addended by: Jacqualin Combes on: 10/24/2018 09:18 AM   Modules accepted: Orders

## 2018-10-24 NOTE — Assessment & Plan Note (Signed)
Stable in the office today, continue losartan 25 mg.  Home BP readings "normal" per patient.  BMP pending.

## 2018-10-24 NOTE — Assessment & Plan Note (Signed)
Overall stable, infrequent use of OTC meds.

## 2018-10-24 NOTE — Patient Instructions (Signed)
Stop by the lab prior to leaving today. I will notify you of your results once received.   Start exercising. You should be getting 150 minutes of moderate exercise weekly.  Continue to work on a healthy diet. Ensure you are consuming 64 ounces of water daily.  Complete the Cologuard kit once received.   It was a pleasure to see you today!   Preventive Care 73 Years and Older, Female Preventive care refers to lifestyle choices and visits with your health care provider that can promote health and wellness. This includes:  A yearly physical exam. This is also called an annual well check.  Regular dental and eye exams.  Immunizations.  Screening for certain conditions.  Healthy lifestyle choices, such as diet and exercise. What can I expect for my preventive care visit? Physical exam Your health care provider will check:  Height and weight. These may be used to calculate body mass index (BMI), which is a measurement that tells if you are at a healthy weight.  Heart rate and blood pressure.  Your skin for abnormal spots. Counseling Your health care provider may ask you questions about:  Alcohol, tobacco, and drug use.  Emotional well-being.  Home and relationship well-being.  Sexual activity.  Eating habits.  History of falls.  Memory and ability to understand (cognition).  Work and work Statistician.  Pregnancy and menstrual history. What immunizations do I need?  Influenza (flu) vaccine  This is recommended every year. Tetanus, diphtheria, and pertussis (Tdap) vaccine  You may need a Td booster every 10 years. Varicella (chickenpox) vaccine  You may need this vaccine if you have not already been vaccinated. Zoster (shingles) vaccine  You may need this after age 18. Pneumococcal conjugate (PCV13) vaccine  One dose is recommended after age 56. Pneumococcal polysaccharide (PPSV23) vaccine  One dose is recommended after age 71. Measles, mumps, and  rubella (MMR) vaccine  You may need at least one dose of MMR if you were born in 1957 or later. You may also need a second dose. Meningococcal conjugate (MenACWY) vaccine  You may need this if you have certain conditions. Hepatitis A vaccine  You may need this if you have certain conditions or if you travel or work in places where you may be exposed to hepatitis A. Hepatitis B vaccine  You may need this if you have certain conditions or if you travel or work in places where you may be exposed to hepatitis B. Haemophilus influenzae type b (Hib) vaccine  You may need this if you have certain conditions. You may receive vaccines as individual doses or as more than one vaccine together in one shot (combination vaccines). Talk with your health care provider about the risks and benefits of combination vaccines. What tests do I need? Blood tests  Lipid and cholesterol levels. These may be checked every 5 years, or more frequently depending on your overall health.  Hepatitis C test.  Hepatitis B test. Screening  Lung cancer screening. You may have this screening every year starting at age 90 if you have a 30-pack-year history of smoking and currently smoke or have quit within the past 15 years.  Colorectal cancer screening. All adults should have this screening starting at age 54 and continuing until age 45. Your health care provider may recommend screening at age 32 if you are at increased risk. You will have tests every 1-10 years, depending on your results and the type of screening test.  Diabetes screening. This is done  by checking your blood sugar (glucose) after you have not eaten for a while (fasting). You may have this done every 1-3 years.  Mammogram. This may be done every 1-2 years. Talk with your health care provider about how often you should have regular mammograms.  BRCA-related cancer screening. This may be done if you have a family history of breast, ovarian, tubal, or  peritoneal cancers. Other tests  Sexually transmitted disease (STD) testing.  Bone density scan. This is done to screen for osteoporosis. You may have this done starting at age 32. Follow these instructions at home: Eating and drinking  Eat a diet that includes fresh fruits and vegetables, whole grains, lean protein, and low-fat dairy products. Limit your intake of foods with high amounts of sugar, saturated fats, and salt.  Take vitamin and mineral supplements as recommended by your health care provider.  Do not drink alcohol if your health care provider tells you not to drink.  If you drink alcohol: ? Limit how much you have to 0-1 drink a day. ? Be aware of how much alcohol is in your drink. In the [place] one drink equals one 12 oz bottle of beer (355 mL), one 5 oz glass of wine (148 mL), or one 1 oz glass of hard liquor (44 mL). Lifestyle  Take daily care of your teeth and gums.  Stay active. Exercise for at least 30 minutes on 5 or more days each week.  Do not use any products that contain nicotine or tobacco, such as cigarettes, e-cigarettes, and chewing tobacco. If you need help quitting, ask your health care provider.  If you are sexually active, practice safe sex. Use a condom or other form of protection in order to prevent STIs (sexually transmitted infections).  Talk with your health care provider about taking a low-dose aspirin or statin. What's next?  Go to your health care provider once a year for a well check visit.  Ask your health care provider how often you should have your eyes and teeth checked.  Stay up to date on all vaccines. This information is not intended to replace advice given to you by your health care provider. Make sure you discuss any questions you have with your health care provider. Document Released: 01/14/2015 Document Revised: 12/12/2017 Document Reviewed: 12/12/2017 Elsevier Patient Education  2020 [employer].

## 2018-10-24 NOTE — Assessment & Plan Note (Signed)
Chronic, infrequent use of Xanax for which she's taken PRN for years. Continue same.

## 2018-10-24 NOTE — Assessment & Plan Note (Signed)
Repeat CBC pending. 

## 2018-10-24 NOTE — Assessment & Plan Note (Signed)
Compliant to levothyroxine and taking correctly. Repeat TSH pending.

## 2018-10-24 NOTE — Progress Notes (Signed)
Patient ID: Teresa Jensen, female   DOB: [birth year], 73 y.o.   MRN: [id number]  Ms. Teresa Jensen is a 73 year old female who presents today for [place].  HPI:  Past Medical History:  Diagnosis Date  . Anxiety   . CAP (community acquired pneumonia)   . COPD (chronic obstructive pulmonary disease) ([place])   . Epistaxis 01/09/2017  . GERD (gastroesophageal reflux disease)   . Hypertension   . New onset of headaches   . Osteopenia   . PMR (polymyalgia rheumatica) ([facility])   . Stasis dermatitis   . Strain of chest wall 01/20/2011  . Tobacco abuse     Current Outpatient Medications  Medication Sig Dispense Refill  . albuterol (PROVENTIL HFA;VENTOLIN HFA) 108 (90 Base) MCG/ACT inhaler INHALE 2 PUFFS INTO THE LUNGS EVERY 4 (FOUR) HOURS AS NEEDED FOR SHORTNESS OF BREATH. 6.7 Inhaler 0  . ALPRAZolam (XANAX) 0.25 MG tablet Take 1 tablet by mouth daily as needed for anxiety. 20 tablet 0  . clobetasol cream (TEMOVATE) 8.75 % Apply 1 application topically 2 (two) times daily.    Marland Kitchen levothyroxine (SYNTHROID, LEVOTHROID) 75 MCG tablet TAKE 1 TABLET BY MOUTH EVERY MORNING ON EMPTY STOMACH WITH A FULL GLASS OF WATER. 90 tablet 3  . losartan (COZAAR) 25 MG tablet TAKE 1 TABLET BY MOUTH EVERY DAY FOR BLOOD PRESSURE 90 tablet 1   No current facility-administered medications for this visit.     Allergies  Allergen Reactions  . Prednisone Anaphylaxis, Hives, Shortness Of Breath, Itching and Swelling    The face swells  . Bisphosphonates Other (See Comments)    Unsure  . Prevnar 13 [Pneumococcal 13-Val Conj Vacc] Other (See Comments)    Reaction not recalled by the patient  . Food Rash    MANGO = cause rashes around the mouth    Family History  Problem Relation Age of Onset  . Heart disease Mother   . Cancer Father        lung/chest wall   . Prostate cancer Father   . Diabetes Brother        siblings  . Hypertension Sister        x 2  . Heart disease Brother   . Breast cancer Paternal Grandmother   .  Heart disease Sister   . Diabetes Sister   . Tuberculosis Sister   . Cirrhosis Brother     Social History   Socioeconomic History  . Marital status: Married    Spouse name: Not on file  . Number of children: 1  . Years of education: Not on file  . Highest education level: Not on file  Occupational History  . 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 Financial resource strain: Not on file  . Food insecurity    Worry: Not on file    Inability: Not on file  . Transportation needs    Medical: Not on file    Non-medical: Not on file  Tobacco Use  . Smoking status: Former Smoker    Packs/day: 1.50    Years: 40.00    Pack years: 60.00    Types: Cigarettes    Quit date: 02/11/2009    Years since quitting: 9.7  . Smokeless tobacco: Never Used  Substance and Sexual Activity  . Alcohol use: No    Alcohol/week: 0.0 standard drinks  . Drug use: No  . Sexual activity: Never  Lifestyle  .  Physical activity    Days per week: Not on file    Minutes per session: Not on file  . Stress: Not on file  Relationships  . Social Herbalist on phone: Not on file    Gets together: Not on file    Attends religious service: Not on file    Active member of club or organization: Not on file    Attends meetings of clubs or organizations: Not on file    Relationship status: Not on file  . Intimate partner violence    Fear of current or ex partner: Not on file    Emotionally abused: Not on file    Physically abused: Not on file    Forced sexual activity: Not on file  Other Topics Concern  . Not on file  Social History Narrative   Lives in [place] with her husband.  Takes care of her two grandchildren, ages 26 and 2, everyday in [place].  Has one son who is 59.   Desires CPR   Would not want prolonged life support if futile    Hospitiliaztions: February 2020, pneumonia   Health Maintenance:    Flu: Due  today  Tetanus: Completed in 2010  Pneumovax: Completed per patient  Prevnar: Completed in 2016  Zostavax: Never completed. Physicist, medical.  Bone Density: Completed in 2019.  Colonoscopy: Completed in 2013, due in 2020 and patient  Cancelled. Patient opts for Cologuard  Eye Doctor: Completes annually   Dental Exam: Completes annually   Mammogram: Completed in June 2020  Hep C Screening:    Providers: Alma Friendly, PCP; Dr. Saunders Revel, cardiology; Dr. Augustin Coupe, Derm   I have personally reviewed and have noted: 1. The patient's medical and social history 2. Their use of alcohol, tobacco or illicit drugs 3. Their current medications and supplements 4. The patient's functional ability including ADL's, fall risks, home  safety risks and hearing or visual impairment. 5. Diet and physical activities 6. Evidence for depression or mood disorder  Subjective:   Review of Systems:   Constitutional: Denies fever, malaise, fatigue, headache or abrupt weight changes.  HEENT: Denies eye pain, eye redness, ear pain, ringing in the ears, wax buildup, runny nose, nasal congestion, bloody nose, or sore throat. Respiratory: Denies cough or sputum production.   Cardiovascular: Denies chest pain, chest tightness, palpitations or swelling in the hands or feet.  Gastrointestinal: Denies abdominal pain, bloating, constipation, diarrhea or blood in the stool.  GU: Denies urgency, frequency, pain with urination, burning sensation, blood in urine, odor or discharge. Musculoskeletal: Denies decrease in range of motion, difficulty with gait, muscle pain or joint pain and swelling.  Skin: Denies redness, rashes, lesions or ulcercations.  Neurological: Denies dizziness, difficulty with memory, difficulty with speech or problems with balance and coordination.  Psychiatric: Denies concerns for anxiety or depression.   No other specific complaints in a complete review of systems (except as listed in HPI  above).  Objective:  PE:   There were no vitals taken for this visit. Wt Readings from Last 3 Encounters:  04/17/18 177 lb 4 oz (80.4 kg)  03/14/18 179 lb 8 oz (81.4 kg)  02/24/18 177 lb 12 oz (80.6 kg)    General: Appears their stated age, well developed, well nourished in NAD. Skin: Warm, dry and intact. No rashes, lesions or ulcerations noted. HEENT: Head: normal shape and size; Eyes: sclera white, no icterus, conjunctiva pink, PERRLA and EOMs intact; Ears: Tm's gray and  intact, normal light reflex; Nose: mucosa pink and moist, septum midline; Throat/Mouth: Teeth present, mucosa pink and moist, no exudate, lesions or ulcerations noted.  Neck: Normal range of motion. Neck supple, trachea midline. No massses, lumps or thyromegaly present.  Cardiovascular: Normal rate and rhythm. S1,S2 noted.  No murmur, rubs or gallops noted. No JVD or BLE edema. No carotid bruits noted. Pulmonary/Chest: Normal effort and positive vesicular breath sounds. No respiratory distress. No wheezes, rales or ronchi noted.  Abdomen: Soft and nontender. Normal bowel sounds, no bruits noted. No distention or masses noted. Liver, spleen and kidneys non palpable. Musculoskeletal: Normal range of motion. No signs of joint swelling. No difficulty with gait.  Neurological: Alert and oriented. Cranial nerves II-XII intact. Coordination normal. +DTRs bilaterally. Psychiatric: Mood and affect normal. Behavior is normal. Judgment and thought content normal.   BP Readings from Last 3 Encounters:  10/24/18 138/84  04/17/18 (!) 160/80  03/14/18 126/80     EKG:  BMET    Component Value Date/Time   NA 137 02/24/2018 1008   K 4.1 02/24/2018 1008   CL 98 02/24/2018 1008   CO2 31 02/24/2018 1008   GLUCOSE 106 (H) 02/24/2018 1008   BUN 12 02/24/2018 1008   CREATININE 0.58 02/24/2018 1008   CALCIUM 9.0 02/24/2018 1008   GFRNONAA >60 02/16/2018 1016   GFRAA >60 02/16/2018 1016    Lipid Panel     Component Value  Date/Time   CHOL 145 10/17/2017 0800   TRIG 104.0 10/17/2017 0800   HDL 40.80 10/17/2017 0800   CHOLHDL 4 10/17/2017 0800   VLDL 20.8 10/17/2017 0800   LDLCALC 84 10/17/2017 0800    CBC    Component Value Date/Time   WBC 12.0 (H) 02/15/2018 0539   RBC 4.13 02/15/2018 0539   HGB 11.7 (L) 02/15/2018 0539   HCT 40.3 02/15/2018 0539   PLT 388 02/15/2018 0539   MCV 97.6 02/15/2018 0539   MCH 28.3 02/15/2018 0539   MCHC 29.0 (L) 02/15/2018 0539   RDW 14.9 02/15/2018 0539   LYMPHSABS 2.0 02/13/2018 1528   MONOABS 1.6 (H) 02/13/2018 1528   EOSABS 0.0 02/13/2018 1528   BASOSABS 0.1 02/13/2018 1528    Hgb A1C Lab Results  Component Value Date   HGBA1C 6.4 10/17/2017      Assessment and Plan:   Medicare Annual Wellness Visit:  Diet: She endorses a healthy diet.  Physical activity: Sedentary, sometimes active Depression/mood screen: Negative Hearing: Intact to whispered voice Visual acuity: Grossly normal, performs annual eye exam  ADLs: Capable Fall risk: None Home safety: Good Cognitive evaluation: Intact to orientation, naming, recall and repetition EOL planning: Adv directives, full code/ I agree  Preventative Medicine: Immunizations UTD per patient, insurance will not cover Shingrix. Colonoscopy due, patient declines and opts for Cologuard which was sent. Bone density and mammogram UTD. Recommended to start exercising, continue to work on diet. Exam today stable. Labs pending. All recommendations provided at end of visit.  Next appointment: One year

## 2018-10-24 NOTE — Assessment & Plan Note (Signed)
Stable, infrequent use of albuterol inhaler. Lungs clear during exam today.

## 2018-10-24 NOTE — Assessment & Plan Note (Signed)
Repeat A1C pending.  Discussed the importance of a healthy diet and regular exercise in order for weight loss, and to reduce the risk of any potential medical problems.  

## 2018-10-24 NOTE — Assessment & Plan Note (Signed)
Stable. No current use of medications.

## 2018-10-24 NOTE — Assessment & Plan Note (Signed)
Not taking calcium and vitamin D, encouraged to start.  Repeat bone density due 2021.

## 2018-10-24 NOTE — Assessment & Plan Note (Signed)
Following with dermatology who is requesting autoimmune work up. Labs pending and will need to be faxed to Dr. Laurence Ferrari at [phone]

## 2018-10-27 LAB — ANA: Anti Nuclear Antibody (ANA): NEGATIVE

## 2018-10-28 ENCOUNTER — Encounter: Payer: Self-pay | Admitting: *Deleted

## 2018-10-28 ENCOUNTER — Other Ambulatory Visit: Payer: Self-pay | Admitting: Primary Care

## 2018-10-28 DIAGNOSIS — E038 Other specified hypothyroidism: Secondary | ICD-10-CM

## 2018-10-28 DIAGNOSIS — E559 Vitamin D deficiency, unspecified: Secondary | ICD-10-CM

## 2018-10-29 MED ORDER — VITAMIN D (ERGOCALCIFEROL) 1.25 MG (50000 UNIT) PO CAPS: ORAL_CAPSULE | ORAL | 0 refills | Status: DC

## 2018-11-10 ENCOUNTER — Encounter: Payer: Self-pay | Admitting: Primary Care

## 2018-11-10 NOTE — Progress Notes (Signed)
New Outpatient Visit Date: 11/12/2018  Referring Provider: Hillary Bow, MD [place],   [id number]  Chief Complaint: Shortness of breath  HPI:  Teresa Jensen is a 73 y.o. female who is being seen today for the evaluation of diastolic heart failiure at the request of Sudini, Alveta Heimlich, MD. She has a history of hypertension, COPD, polymyalgia rheumatica, and GERD.  She was hospitalized in 02/2018 for shortness of breath and weakness.  Chest radiograph during the admission suggested pulmonary edema.  Echo showed normal LVEF with grade 1 diastolic dysfunction.  She was treated with antibiotics and furosemide with improvement in her symptoms.  She was not seen by cardiology during her hospitalization.  Outpatient consultation has thus far been deferred due to COVID-19.  Today, Teresa Jensen reports that she has been doing relatively well.  She recovered from her hospitalization in February and states that her breathing is back to baseline.  She has chronic exertional dyspnea that she attributes to COPD.  This has been stable for years.  She denies chest pain.  She reports occasional palpitations if she "gets a little upset."  There are no associated symptoms.  She has not had any orthopnea or PND.  She notes occasional leg swelling with stasis dermatitis, managed by her dermatologist Dr. Laurence Ferrari.  She is currently being treated with pentoxifylline and compression stockings for this.  She reports having undergone a stress test 10 to 20 years ago with Dr. Ubaldo Glassing; she believes it was normal.  --------------------------------------------------------------------------------------------------  Cardiovascular History & Procedures: Cardiovascular Problems:  HFpEF  Risk Factors:  Hypertension, age >54  Cath/PCI:  None  CV Surgery:  None  EP Procedures and Devices:  None  Non-Invasive Evaluation(s):  TTE (02/16/2018): Normal LV size and wall thickness.  LVEF 60-65% with grade 1  diastolic dysfunction   Normal RV size and function.  Mild mitral regurgitation.  Recent CV Pertinent Labs: Lab Results  Component Value Date   CHOL 167 10/24/2018   HDL 43.90 10/24/2018   LDLCALC 95 10/24/2018   TRIG 143.0 10/24/2018   CHOLHDL 4 10/24/2018   INR 1.16 01/07/2017   BNP 248.0 (H) 02/15/2018   K 4.0 10/24/2018   MG 2.3 01/13/2017   BUN 12 10/24/2018   CREATININE 0.73 10/24/2018    --------------------------------------------------------------------------------------------------  Past Medical History:  Diagnosis Date   Anxiety    CAP (community acquired pneumonia)    COPD (chronic obstructive pulmonary disease) ([place])    Epistaxis 01/09/2017   GERD (gastroesophageal reflux disease)    Hypertension    New onset of headaches    Osteopenia    PMR (polymyalgia rheumatica) ([facility])    Pneumonia 02/13/2018   Stasis dermatitis    Strain of chest wall 01/20/2011   Tobacco abuse     Past Surgical History:  Procedure Laterality Date   ABDOMINAL HYSTERECTOMY     APPENDECTOMY     BACK SURGERY     CARPAL TUNNEL RELEASE Left    CERVICAL FUSION     CHOLECYSTECTOMY     HEMORRHOID SURGERY     LUMBAR FUSION     TUBAL LIGATION      Current Meds  Medication Sig   albuterol (PROVENTIL HFA;VENTOLIN HFA) 108 (90 Base) MCG/ACT inhaler INHALE 2 PUFFS INTO THE LUNGS EVERY 4 (FOUR) HOURS AS NEEDED FOR SHORTNESS OF BREATH.   ALPRAZolam (XANAX) 0.25 MG tablet Take 1 tablet by mouth daily as needed for anxiety.   levothyroxine (SYNTHROID) 75 MCG tablet Take 1  tablet by mouth every morning on empty stomach with a  glass of water only.   losartan (COZAAR) 25 MG tablet TAKE 1 TABLET BY MOUTH EVERY DAY FOR BLOOD PRESSURE   pentoxifylline (TRENTAL) 400 MG CR tablet Take 1 tablet by mouth 3 (three) times daily.   tacrolimus (PROTOPIC) 0.1 % ointment Apply  as directed to affected area twice a day   Vitamin D, Ergocalciferol, (DRISDOL) 1.25 MG (50000 UT) CAPS  capsule Take 1 capsule by mouth once weekly for 12 weeks.    Allergies: Prednisone, Bisphosphonates, Prevnar 13 [pneumococcal 13-val conj vacc], and Food  Social History   Tobacco Use   Smoking status: Former Smoker    Packs/day: 1.50    Years: 20.00    Pack years: 30.00    Types: Cigarettes    Quit date: 02/11/2009    Years since quitting: 9.7   Smokeless tobacco: Never Used  Substance Use Topics   Alcohol use: No    Alcohol/week: 0.0 standard drinks   Drug use: No    Family History  Problem Relation Age of Onset   Heart disease Mother    Cancer Father        lung/chest wall    Prostate cancer Father    Diabetes Brother        siblings   Hypertension Sister        x 2   Heart disease Brother    Breast cancer Paternal Grandmother    Heart disease Sister    Diabetes Sister    Tuberculosis Sister    Cirrhosis Brother     Review of Systems: A 12-system review of systems was performed and was negative except as noted in the HPI.  --------------------------------------------------------------------------------------------------  Physical Exam: BP (!) 143/76 (BP Location: Right Arm, Patient Position: Sitting, Cuff Size: Large)    Pulse 95    Ht 4\' 9"  (1.448 m)    Wt 184 lb 12 oz (83.8 kg)    SpO2 94%    BMI 39.98 kg/m   General: NAD. HEENT: No conjunctival pallor or scleral icterus.  Facemask in place. Neck: Supple without lymphadenopathy, thyromegaly, JVD, or HJR. No carotid bruit. Lungs: Normal work of breathing. Clear to auscultation bilaterally without wheezes or crackles. Heart: Regular rate and rhythm without murmurs, rubs, or gallops. Non-displaced PMI. Abd: Bowel sounds present. Soft, NT/ND without hepatosplenomegaly Ext: No lower extremity edema with compression stockings in place. Radial, PT, and DP pulses are 2+ bilaterally Skin: Warm and dry without rash. Neuro: CNIII-XII intact. Strength and fine-touch sensation intact in upper and lower  extremities bilaterally. Psych: Normal mood and affect.  EKG: Normal sinus rhythm with nonspecific ST changes.  Lab Results  Component Value Date   WBC 10.5 10/24/2018   HGB 14.6 10/24/2018   HCT 44.3 10/24/2018   MCV 90.0 10/24/2018   PLT 306.0 10/24/2018    Lab Results  Component Value Date   NA 137 10/24/2018   K 4.0 10/24/2018   CL 99 10/24/2018   CO2 30 10/24/2018   BUN 12 10/24/2018   CREATININE 0.73 10/24/2018   GLUCOSE 158 (H) 10/24/2018   ALT 25 10/24/2018    Lab Results  Component Value Date   CHOL 167 10/24/2018   HDL 43.90 10/24/2018   LDLCALC 95 10/24/2018   TRIG 143.0 10/24/2018   CHOLHDL 4 10/24/2018     --------------------------------------------------------------------------------------------------  ASSESSMENT AND PLAN: Chronic HFpEF: Teresa Jensen appears euvolemic and well compensated with NYHA class room and improved to  symptoms consisting primarily of exertional dyspnea that may also be related to underlying COPD.  Blood pressure is mildly elevated today.  We will work on blood pressure control.  I have encouraged sodium restriction.  Dyspnea on exertion and abnormal EKG: Dyspnea on exertion is most likely due to COPD, though Teresa Jensen has several risk factors for coronary artery disease.  Nonspecific ST segment changes are also noted on EKG today.  We have agreed to perform a pharmacologic myocardial perfusion stress test.  If stress test is low risk, no further cardiac work-up is recommended.  Hypertension: Blood pressure suboptimally controlled today.  We will increase losartan to 50 mg daily with repeat BMP in about 2 weeks when Teresa Jensen returns for her stress test.  Follow-up: Return to clinic in 6 months, sooner if stress test shows significant abnormality or new symptoms develop.  Nelva Bush, MD 11/12/2018 8:41 AM

## 2018-11-12 ENCOUNTER — Ambulatory Visit (INDEPENDENT_AMBULATORY_CARE_PROVIDER_SITE_OTHER): Payer: Medicare Other | Admitting: Internal Medicine

## 2018-11-12 ENCOUNTER — Encounter: Payer: Self-pay | Admitting: Internal Medicine

## 2018-11-12 ENCOUNTER — Telehealth: Payer: Self-pay | Admitting: Internal Medicine

## 2018-11-12 ENCOUNTER — Other Ambulatory Visit: Payer: Self-pay

## 2018-11-12 VITALS — BP 143/76 | HR 95 | Ht <= 58 in | Wt 184.8 lb

## 2018-11-12 DIAGNOSIS — I10 Essential (primary) hypertension: Secondary | ICD-10-CM

## 2018-11-12 DIAGNOSIS — I5032 Chronic diastolic (congestive) heart failure: Secondary | ICD-10-CM | POA: Diagnosis not present

## 2018-11-12 DIAGNOSIS — R0609 Other forms of dyspnea: Secondary | ICD-10-CM | POA: Insufficient documentation

## 2018-11-12 DIAGNOSIS — R9431 Abnormal electrocardiogram [ECG] [EKG]: Secondary | ICD-10-CM

## 2018-11-12 DIAGNOSIS — R0600 Dyspnea, unspecified: Secondary | ICD-10-CM

## 2018-11-12 HISTORY — DX: Abnormal electrocardiogram (ECG) (EKG): R94.31

## 2018-11-12 MED ORDER — LOSARTAN POTASSIUM 50 MG PO TABS: 50.0000 mg | ORAL_TABLET | Freq: Every day | ORAL | 3 refills | Status: DC

## 2018-11-12 NOTE — Telephone Encounter (Signed)
Patient doesn't have schedule at checkout and wants to call when she gets home. Patient needs lexi and fu 6 m with End

## 2018-11-12 NOTE — Patient Instructions (Addendum)
Medication Instructions:  Your physician has recommended you make the following change in your medication:  1- INCREASE Losartan to 50 mg by mouth once a day.  *If you need a refill on your cardiac medications before your next appointment, please call your pharmacy*  Lab Work: Your physician recommends that you return for lab work in: about 2 weeks on the same morning as your stress test at the [facility] ([facility]). - Please go to the [facility]. You will check in at the front desk to the right as you walk into the atrium. Valet Parking is offered if needed. - No appointment needed. You may go any day between 7 am and 6 pm.   If you have labs (blood work) drawn today and your tests are completely normal, you will receive your results only by: Marland Kitchen MyChart Message (if you have MyChart) OR . A paper copy in the mail If you have any lab test that is abnormal or we need to change your treatment, we will call you to review the results.  Testing/Procedures: 1) In about 2 weeks - Your physician has requested that you have a lexiscan myoview. For further information please visit [email or web address]. Please follow instruction sheet, as given.  [place]  Your caregiver has ordered a Stress Test with nuclear imaging. The purpose of this test is to evaluate the blood supply to your heart muscle. This procedure is referred to as a "Non-Invasive Stress Test." This is because other than having an IV started in your vein, nothing is inserted or "invades" your body. Cardiac stress tests are done to find areas of poor blood flow to the heart by determining the extent of coronary artery disease (CAD). Some patients exercise on a treadmill, which naturally increases the blood flow to your heart, while others who are  unable to walk on a treadmill due to physical limitations have a pharmacologic/chemical stress agent called Lexiscan . This medicine will mimic walking on a treadmill by temporarily increasing  your coronary blood flow.   Please note: these test may take anywhere between 2-4 hours to complete  PLEASE REPORT TO [place] AT THE FIRST DESK WILL DIRECT YOU WHERE TO GO  Date of Procedure:_____________________________________  Arrival Time for Procedure:______________________________   PLEASE NOTIFY THE OFFICE AT LEAST 24 HOURS IN ADVANCE IF YOU ARE UNABLE TO KEEP YOUR APPOINTMENT.  [phone] AND  PLEASE NOTIFY NUCLEAR MEDICINE AT [facility] AT LEAST 24 HOURS IN ADVANCE IF YOU ARE UNABLE TO KEEP YOUR APPOINTMENT. [phone]  How to prepare for your Myoview test:  1. Do not eat or drink after midnight 2. No caffeine for 24 hours prior to test 3. No smoking 24 hours prior to test. 4. Your medication may be taken with water.  If your doctor stopped a medication because of this test, do not take that medication. 5. Ladies, please do not wear dresses.  Skirts or pants are appropriate. Please wear a short sleeve shirt. 6. No perfume, cologne or lotion. 7. Wear comfortable walking shoes. No heels.  Follow-Up: At [facility], you and your health needs are our priority.  As part of our continuing mission to provide you with exceptional heart care, we have created designated Provider Care Teams.  These Care Teams include your primary Cardiologist (physician) and Advanced Practice Providers (APPs -  Physician Assistants and Nurse Practitioners) who all work together to provide you with the care you need, when you need it.  Your  next appointment:   6 months  The format for your next appointment:   In Person  Provider:    You may see DR Harrell Gave END or one of the following Advanced Practice Providers on your designated Care Team:    Murray Hodgkins, NP  Christell Faith, PA-C  Marrianne Mood, PA-C   Cardiac Nuclear Scan A cardiac nuclear scan is a test that measures blood flow to the heart when a person is resting and when he or she is  exercising. The test looks for problems such as:  Not enough blood reaching a portion of the heart.  The heart muscle not working normally. You may need this test if:  You have heart disease.  You have had abnormal lab results.  You have had heart surgery or a balloon procedure to open up blocked arteries (angioplasty).  You have chest pain.  You have shortness of breath. In this test, a radioactive dye (tracer) is injected into your bloodstream. After the tracer has traveled to your heart, an imaging device is used to measure how much of the tracer is absorbed by or distributed to various areas of your heart. This procedure is usually done at a hospital and takes 2-4 hours. Tell a health care provider about:  Any allergies you have.  All medicines you are taking, including vitamins, herbs, eye drops, creams, and over-the-counter medicines.  Any problems you or family members have had with anesthetic medicines.  Any blood disorders you have.  Any surgeries you have had.  Any medical conditions you have.  Whether you are pregnant or may be pregnant. What are the risks? Generally, this is a safe procedure. However, problems may occur, including:  Serious chest pain and heart attack. This is only a risk if the stress portion of the test is done.  Rapid heartbeat.  Sensation of warmth in your chest. This usually passes quickly.  Allergic reaction to the tracer. What happens before the procedure?  Ask your health care provider about changing or stopping your regular medicines. This is especially important if you are taking diabetes medicines or blood thinners.  Follow instructions from your health care provider about eating or drinking restrictions.  Remove your jewelry on the day of the procedure. What happens during the procedure?  An IV will be inserted into one of your veins.  Your health care provider will inject a small amount of radioactive tracer through the  IV.  You will wait for 20-40 minutes while the tracer travels through your bloodstream.  Your heart activity will be monitored with an electrocardiogram (ECG).  You will lie down on an exam table.  Images of your heart will be taken for about 15-20 minutes.  You may also have a stress test. For this test, one of the following may be done: ? You will exercise on a treadmill or stationary bike. While you exercise, your heart's activity will be monitored with an ECG, and your blood pressure will be checked. ? You will be given medicines that will increase blood flow to parts of your heart. This is done if you are unable to exercise.  When blood flow to your heart has peaked, a tracer will again be injected through the IV.  After 20-40 minutes, you will get back on the exam table and have more images taken of your heart.  Depending on the type of tracer used, scans may need to be repeated 3-4 hours later.  Your IV line will be removed when  the procedure is over. The procedure may vary among health care providers and hospitals. What happens after the procedure?  Unless your health care provider tells you otherwise, you may return to your normal schedule, including diet, activities, and medicines.  Unless your health care provider tells you otherwise, you may increase your fluid intake. This will help to flush the contrast dye from your body. Drink enough fluid to keep your urine pale yellow.  Ask your health care provider, or the department that is doing the test: ? When will my results be ready? ? How will I get my results? Summary  A cardiac nuclear scan measures the blood flow to the heart when a person is resting and when he or she is exercising.  Tell your health care provider if you are pregnant.  Before the procedure, ask your health care provider about changing or stopping your regular medicines. This is especially important if you are taking diabetes medicines or blood  thinners.  After the procedure, unless your health care provider tells you otherwise, increase your fluid intake. This will help flush the contrast dye from your body.  After the procedure, unless your health care provider tells you otherwise, you may return to your normal schedule, including diet, activities, and medicines. This information is not intended to replace advice given to you by your health care provider. Make sure you discuss any questions you have with your health care provider. Document Released: 01/13/2004 Document Revised: 06/03/2017 Document Reviewed: 06/03/2017 Elsevier Patient Education  2020 [employer].

## 2018-11-13 ENCOUNTER — Other Ambulatory Visit: Payer: Self-pay | Admitting: Primary Care

## 2018-11-13 DIAGNOSIS — I10 Essential (primary) hypertension: Secondary | ICD-10-CM

## 2018-11-15 LAB — COLOGUARD

## 2018-11-26 ENCOUNTER — Ambulatory Visit
Admission: RE | Admit: 2018-11-26 | Discharge: 2018-11-26 | Disposition: A | Discharge status: Home / Self Care | Payer: Medicare Other | Source: Ambulatory Visit | Attending: Internal Medicine | Admitting: Internal Medicine

## 2018-11-26 ENCOUNTER — Other Ambulatory Visit: Payer: Self-pay

## 2018-11-26 ENCOUNTER — Other Ambulatory Visit
Admission: RE | Admit: 2018-11-26 | Discharge: 2018-11-26 | Disposition: A | Discharge status: Home / Self Care | Payer: Medicare Other | Source: Home / Self Care | Attending: Internal Medicine | Admitting: Internal Medicine

## 2018-11-26 DIAGNOSIS — R0609 Other forms of dyspnea: Secondary | ICD-10-CM

## 2018-11-26 DIAGNOSIS — R9431 Abnormal electrocardiogram [ECG] [EKG]: Secondary | ICD-10-CM | POA: Insufficient documentation

## 2018-11-26 DIAGNOSIS — R0600 Dyspnea, unspecified: Secondary | ICD-10-CM | POA: Diagnosis present

## 2018-11-26 LAB — BASIC METABOLIC PANEL
Anion gap: 11 (ref 5–15)
BUN: 11 mg/dL (ref 8–23)
CO2: 26 mmol/L (ref 22–32)
Calcium: 9 mg/dL (ref 8.9–10.3)
Chloride: 101 mmol/L (ref 98–111)
Creatinine, Ser: 0.58 mg/dL (ref 0.44–1.00)
GFR calc Af Amer: >60 mL/min (ref >60)
GFR calc non Af Amer: >60 mL/min (ref >60)
Glucose, Bld: 120 mg/dL — ABNORMAL HIGH (ref 70–99)
Potassium: 4.2 mmol/L (ref 3.5–5.1)
Sodium: 138 mmol/L (ref 135–145)

## 2018-11-26 LAB — NM MYOCAR MULTI W/SPECT W/WALL MOTION / EF
LV dias vol: 83 mL (ref 46–106)
LV sys vol: 37 mL
Peak HR: 123 {beats}/min
Percent HR: 83 %
Rest HR: 83 {beats}/min
TID: 1.06

## 2018-11-26 MED ORDER — TECHNETIUM TC 99M TETROFOSMIN IV KIT
30.0000 | PACK | Freq: Once | INTRAVENOUS | Status: AC | PRN
  Administered 2018-11-26: 31.1 via INTRAVENOUS

## 2018-11-26 MED ORDER — REGADENOSON 0.4 MG/5ML IV SOLN
0.4000 mg | Freq: Once | INTRAVENOUS | Status: AC
  Administered 2018-11-26: 0.4 mg via INTRAVENOUS

## 2018-11-26 MED ORDER — TECHNETIUM TC 99M TETROFOSMIN IV KIT
10.0000 | PACK | Freq: Once | INTRAVENOUS | Status: AC | PRN
  Administered 2018-11-26: 10.65 via INTRAVENOUS

## 2019-01-14 ENCOUNTER — Other Ambulatory Visit: Payer: Self-pay | Admitting: Primary Care

## 2019-01-14 DIAGNOSIS — E559 Vitamin D deficiency, unspecified: Secondary | ICD-10-CM

## 2019-01-14 NOTE — Telephone Encounter (Signed)
Last prescribed on 10/29/2018 . Last appointment on 10/24/2018 . Next lab appointment on 01/30/2019

## 2019-01-15 NOTE — Telephone Encounter (Signed)
Refill sent to pharmacy. Recheck vitamin D at upcoming visit.

## 2019-01-19 ENCOUNTER — Other Ambulatory Visit: Payer: Self-pay | Admitting: Primary Care

## 2019-01-19 DIAGNOSIS — E538 Deficiency of other specified B group vitamins: Secondary | ICD-10-CM

## 2019-01-19 DIAGNOSIS — E559 Vitamin D deficiency, unspecified: Secondary | ICD-10-CM

## 2019-01-29 ENCOUNTER — Other Ambulatory Visit (INDEPENDENT_AMBULATORY_CARE_PROVIDER_SITE_OTHER): Payer: Medicare Other

## 2019-01-29 DIAGNOSIS — E538 Deficiency of other specified B group vitamins: Secondary | ICD-10-CM

## 2019-01-29 DIAGNOSIS — E559 Vitamin D deficiency, unspecified: Secondary | ICD-10-CM

## 2019-01-29 LAB — VITAMIN B12: Vitamin B-12: 228 pg/mL (ref 211–911)

## 2019-01-29 LAB — VITAMIN D 25 HYDROXY (VIT D DEFICIENCY, FRACTURES): VITD: 27 ng/mL — ABNORMAL LOW (ref 30.00–100.00)

## 2019-01-30 ENCOUNTER — Other Ambulatory Visit: Payer: Medicare Other

## 2019-04-17 IMAGING — DX DG FOOT COMPLETE 3+V*L*
3 series · 3 of 3 positions shown · non-contrast
Comparison: Radiographs May 31, 2015.

CLINICAL DATA: Acute left foot pain after fall yesterday.

EXAM:
LEFT FOOT - COMPLETE 3+ VIEW

[foot ap]
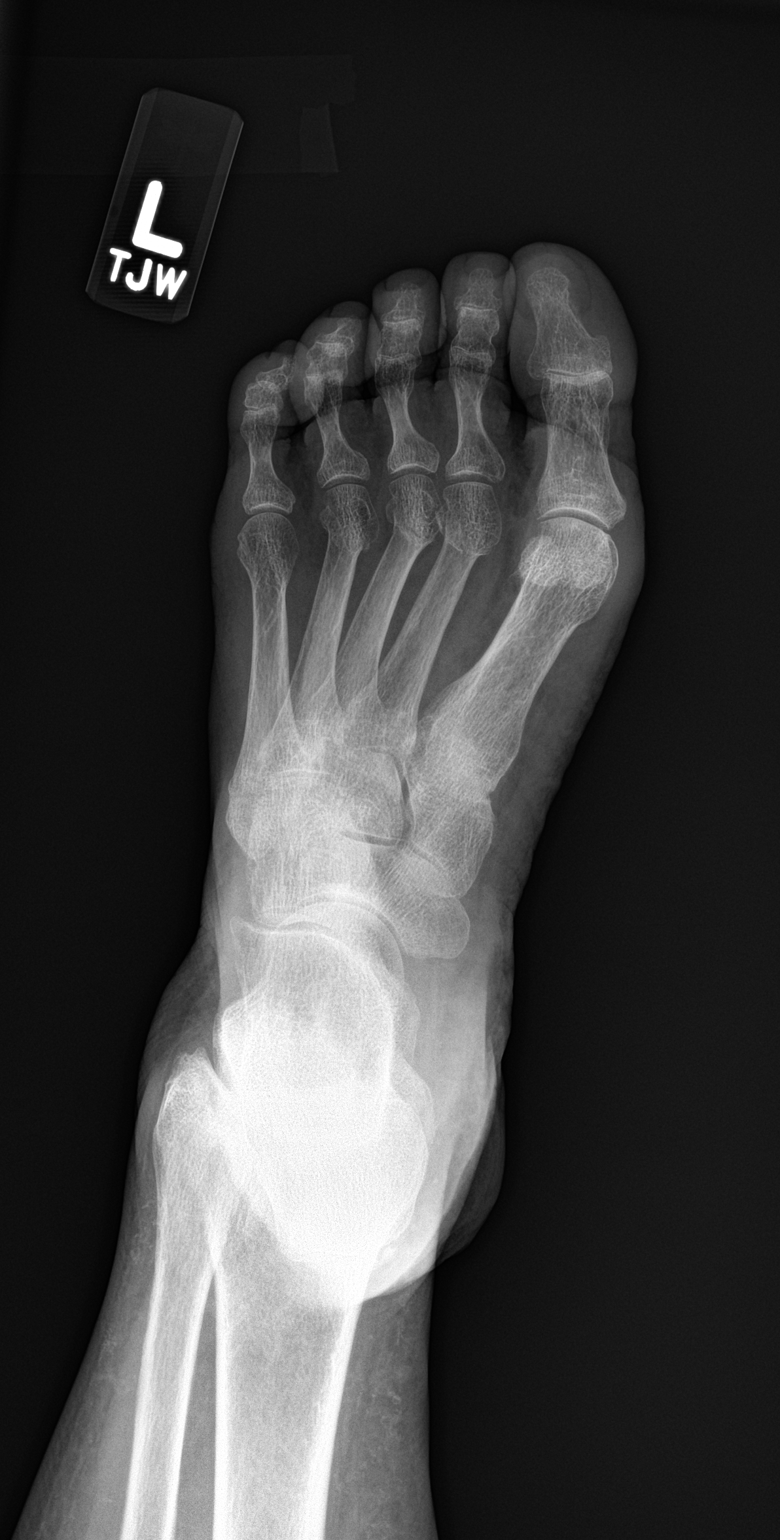

[foot obl]
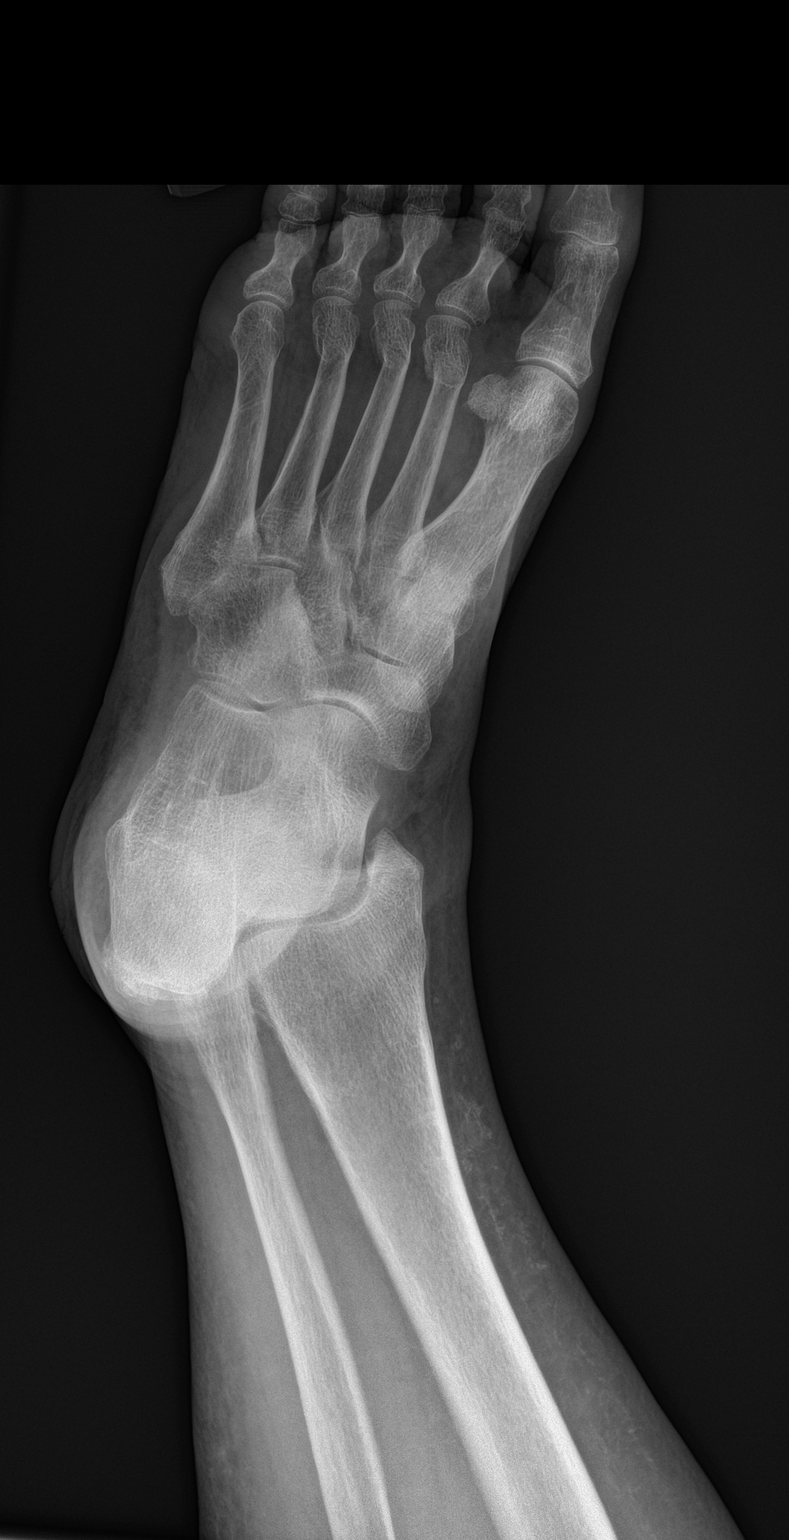

[foot lat]
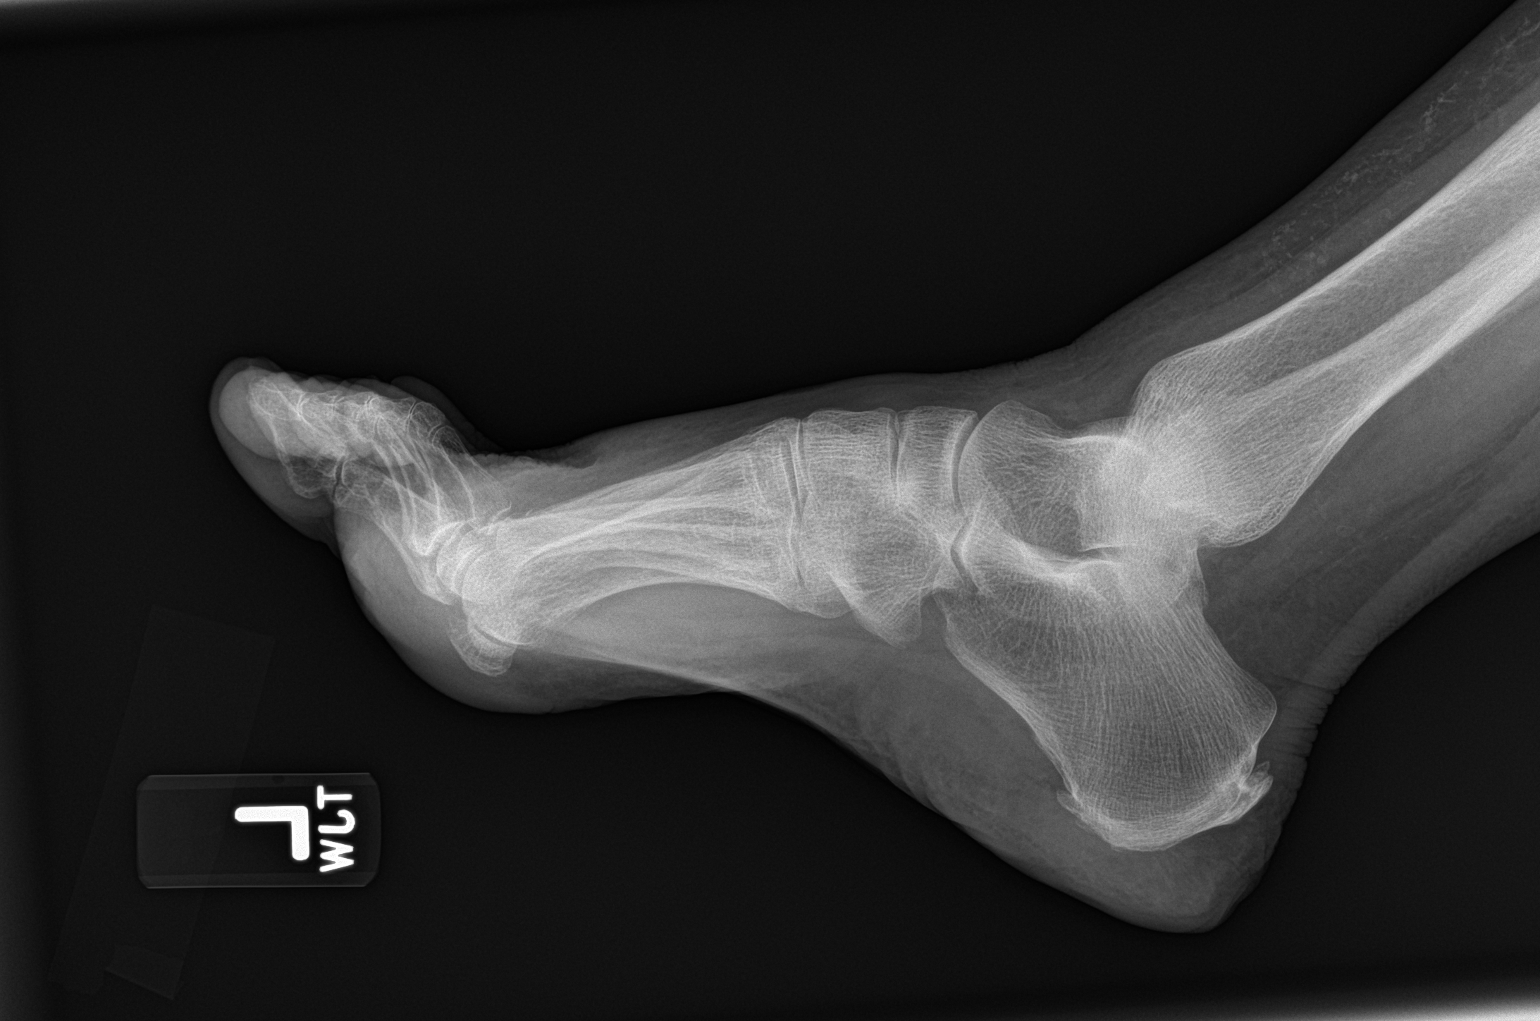

[3 of 3 positions shown; findings below may reference images not displayed]

FINDINGS: Probable mildly angulated fractures are seen involving the distal
portions of the second, third and fourth metatarsals. Mild spurring
of posterior calcaneus is noted. Joint spaces are intact. No
significant soft tissue abnormality is noted.
IMPRESSION: Probable mildly angulated fractures are seen involving the distal
portions of the second, third and fourth metatarsals.

## 2019-04-18 ENCOUNTER — Other Ambulatory Visit: Payer: Self-pay | Admitting: Primary Care

## 2019-04-18 DIAGNOSIS — E559 Vitamin D deficiency, unspecified: Secondary | ICD-10-CM

## 2019-04-29 ENCOUNTER — Ambulatory Visit (INDEPENDENT_AMBULATORY_CARE_PROVIDER_SITE_OTHER): Payer: Medicare Other | Admitting: Dermatology

## 2019-04-29 ENCOUNTER — Other Ambulatory Visit: Payer: Self-pay

## 2019-04-29 DIAGNOSIS — I8312 Varicose veins of left lower extremity with inflammation: Secondary | ICD-10-CM

## 2019-04-29 DIAGNOSIS — I8311 Varicose veins of right lower extremity with inflammation: Secondary | ICD-10-CM

## 2019-04-29 DIAGNOSIS — I872 Venous insufficiency (chronic) (peripheral): Secondary | ICD-10-CM

## 2019-04-29 DIAGNOSIS — R208 Other disturbances of skin sensation: Secondary | ICD-10-CM

## 2019-04-29 DIAGNOSIS — L989 Disorder of the skin and subcutaneous tissue, unspecified: Secondary | ICD-10-CM

## 2019-04-29 MED ORDER — CLOBETASOL PROPIONATE 0.05 % EX OINT: 1.0000 "application " | TOPICAL_OINTMENT | Freq: Two times a day (BID) | CUTANEOUS | 1 refills | Status: DC

## 2019-04-29 MED ORDER — TACROLIMUS 0.1 % EX OINT: TOPICAL_OINTMENT | CUTANEOUS | 1 refills | Status: DC

## 2019-04-29 NOTE — Patient Instructions (Addendum)
Cont clobetasol 0.05% switching to twice daily weekdays x 1 month to more severe areas.  Cont tacrolimus twice daily weekends x 1 month. After 1 month, or sooner if improved, switch back to clobetasol on weekends and tacrolimus on weekdays.  Continue compression socks daily.  Topical steroids (such as triamcinolone, fluocinolone, fluocinonide, mometasone, clobetasol, halobetasol, betamethasone, hydrocortisone) can cause thinning and lightening of the skin if they are used for too long in the same area. Your physician has selected the right strength medicine for your problem and area affected on the body. Please use your medication only as directed by your physician to prevent side effects.

## 2019-04-29 NOTE — Progress Notes (Signed)
   Follow-Up Visit   Subjective  Teresa Jensen is a 74 y.o. female who presents for the following: Follow-up.  Patient here for 2 month follow up for lipodermatosclerosis at B/L medial ankles. Patient wearing compression hose daily, using clobetasol 0.05% twice daily on weekends, tacrolimus twice daily on weekdays. Patient could not tolerate pantoxifylline. Patient think area on left ankle has gotten a little bit larger. It is still painful.  She never bought the Voltaren gel. She uses SARNA lotion prn.  The following portions of the chart were reviewed this encounter and updated as appropriate:     Review of Systems:  No other skin or systemic complaints except as noted in HPI or Assessment and Plan.  Objective  Well appearing patient in no apparent distress; mood and affect are within normal limits.  A focused examination was performed including legs. Relevant physical exam findings are noted in the Assessment and Plan.  Objective  B/L medial ankles: Hyperpigmented indurated plaques B/L ankles. Left medial ankle with pink tender patch. Stasis changes at lower legs with mild yellow brown erythema.  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  Cont tacrolimus 0.1% ointment BID weekends x 1 month. After 1 month switch back to clobetasol on weekends and tacrolimus on weekdays.   Continue compression socks daily.  OTC Ibuprofen 2 tabs PO q6 hrs prn pain, potential risks reviewed (renal effects)   Ordered Medications: clobetasol ointment (TEMOVATE) 0.05 %  Reordered Medications tacrolimus (PROTOPIC) 0.1 % ointment  Painful skin lesion  Skin pain  Stasis dermatitis of both legs  Return in about 3 months (around 07/29/2019) for lipodermatosclerosis.  Graciella Belton,  RMA, am acting as scribe for Brendolyn Patty, MD .  Documentation: I have reviewed the above documentation for accuracy and completeness, and I agree with the above.  Brendolyn Patty, MD

## 2019-04-30 ENCOUNTER — Ambulatory Visit (INDEPENDENT_AMBULATORY_CARE_PROVIDER_SITE_OTHER): Payer: Medicare Other | Admitting: Primary Care

## 2019-04-30 ENCOUNTER — Encounter: Payer: Self-pay | Admitting: Primary Care

## 2019-04-30 VITALS — BP 134/82 | HR 81 | Temp 96.5°F | Ht 59.0 in | Wt 179.5 lb

## 2019-04-30 DIAGNOSIS — R229 Localized swelling, mass and lump, unspecified: Secondary | ICD-10-CM

## 2019-04-30 DIAGNOSIS — F418 Other specified anxiety disorders: Secondary | ICD-10-CM

## 2019-04-30 MED ORDER — ALPRAZOLAM 0.25 MG PO TABS: ORAL_TABLET | ORAL | 0 refills | Status: DC

## 2019-04-30 NOTE — Patient Instructions (Signed)
You will be contacted regarding your ultrasound.  Please let us know if you have not been contacted within two weeks.   Use the alprazolam sparingly as needed.  It was a pleasure to see you today!

## 2019-04-30 NOTE — Progress Notes (Signed)
Subjective:    Patient ID: Teresa Jensen, female    DOB: [birth year], 74 y.o.   MRN: [id number]  HPI  This visit occurred during the SARS-CoV-2 public health emergency.  Safety protocols were in place, including screening questions prior to the visit, additional usage of staff PPE, and extensive cleaning of exam room while observing appropriate contact time as indicated for disinfecting solutions.   Teresa Jensen is a 74 year old female with a history of hypertension, COPD, CHF, hypothyroidism, situational anxiety who presents today with a chief complaint of skin mass.  She would also like a refill of her Xanax for which she uses sparingly. Last refill was in 2019.  The mass is located to the right lateral trunk for which she noticed one week ago. She does have soreness with palpation. The mass is soft. She denies color changes, growth over the last week, shortness of breath.   Review of Systems  Respiratory: Negative for shortness of breath.   Skin: Negative for color change.       Skin mass       Past Medical History:  Diagnosis Date  . Anxiety   . CAP (community acquired pneumonia)   . COPD (chronic obstructive pulmonary disease) ([place])   . Epistaxis 01/09/2017  . GERD (gastroesophageal reflux disease)   . Hypertension   . New onset of headaches   . Osteopenia   . PMR (polymyalgia rheumatica) ([facility])   . Pneumonia 02/13/2018  . Stasis dermatitis   . Strain of chest wall 01/20/2011  . Tobacco abuse      Social History   Socioeconomic History  . Marital status: Married    Spouse name: Not on file  . Number of children: 1  . Years of education: Not on file  . Highest education level: Not on file  Occupational History  . Occupation: Therapist, art as a Therapist, art: RETIRED    Comment: retired  Tobacco Use  . Smoking status: Former Smoker    Packs/day: 1.50    Years: 20.00    Pack years: 30.00    Types: Cigarettes    Quit date: 02/11/2009    Years since  quitting: 10.2  . Smokeless tobacco: Never Used  Substance and Sexual Activity  . Alcohol use: No    Alcohol/week: 0.0 standard drinks  . Drug use: No  . Sexual activity: Never  Other Topics Concern  . Not on file  Social History Narrative   Lives in [place] with her husband.  Takes care of her two grandchildren, ages 53 and 2, everyday in [place].  Has one son who is 40.   Desires CPR   Would not want prolonged life support if futile   Social Determinants of Health   Financial Resource Strain:   . Difficulty of Paying Living Expenses:   Food Insecurity:   . Worried About Charity fundraiser in the Last Year:   . Arboriculturist in the Last Year:   Transportation Needs:   . Film/video editor (Medical):   Marland Kitchen Lack of Transportation (Non-Medical):   Physical Activity:   . Days of Exercise per Week:   . Minutes of Exercise per Session:   Stress:   . Feeling of Stress :   Social Connections:   . Frequency of Communication with Friends and Family:   . Frequency of Social Gatherings with Friends and Family:   . Attends Religious Services:   . Active  Member of Clubs or Organizations:   . Attends Archivist Meetings:   Marland Kitchen Marital Status:   Intimate Partner Violence:   . Fear of Current or Ex-Partner:   . Emotionally Abused:   Marland Kitchen Physically Abused:   . Sexually Abused:     Past Surgical History:  Procedure Laterality Date  . ABDOMINAL HYSTERECTOMY    . APPENDECTOMY    . BACK SURGERY    . CARPAL TUNNEL RELEASE Left   . CERVICAL FUSION    . CHOLECYSTECTOMY    . HEMORRHOID SURGERY    . LUMBAR FUSION    . TUBAL LIGATION      Family History  Problem Relation Age of Onset  . Heart attack Mother 45  . Cancer Father        lung/chest wall   . Prostate cancer Father   . Diabetes Brother        siblings  . Hypertension Sister        x 2  . Heart disease Brother   . Breast cancer Paternal Grandmother   . Heart disease Sister   . Diabetes Sister   .  Tuberculosis Sister   . Cirrhosis Brother     Allergies  Allergen Reactions  . Prednisone Anaphylaxis, Hives, Shortness Of Breath, Itching and Swelling    The face swells  . Bisphosphonates Other (See Comments)    Unsure  . Prevnar 13 [Pneumococcal 13-Val Conj Vacc] Other (See Comments)    Reaction not recalled by the patient  . Food Rash    MANGO = cause rashes around the mouth    Current Outpatient Medications on File Prior to Visit  Medication Sig Dispense Refill  . albuterol (PROVENTIL HFA;VENTOLIN HFA) 108 (90 Base) MCG/ACT inhaler INHALE 2 PUFFS INTO THE LUNGS EVERY 4 (FOUR) HOURS AS NEEDED FOR SHORTNESS OF BREATH. 6.7 Inhaler 0  . ALPRAZolam (XANAX) 0.25 MG tablet Take 1 tablet by mouth daily as needed for anxiety. 20 tablet 0  . clobetasol ointment (TEMOVATE) 6.37 % Apply 1 application topically 2 (two) times daily. 30 g 1  . levothyroxine (SYNTHROID) 75 MCG tablet Take 1 tablet by mouth every morning on empty stomach with a  glass of water only. 90 tablet 3  . losartan (COZAAR) 50 MG tablet Take 1 tablet (50 mg total) by mouth daily. 90 tablet 3  . pentoxifylline (TRENTAL) 400 MG CR tablet Take 1 tablet by mouth 3 (three) times daily.    . tacrolimus (PROTOPIC) 0.1 % ointment Apply  as directed to affected area twice a day 100 g 1  . Vitamin D, Ergocalciferol, (DRISDOL) 1.25 MG (50000 UNIT) CAPS capsule TAKE 1 CAPSULE BY MOUTH ONCE WEEKLY FOR 12 WEEKS. 12 capsule 0   No current facility-administered medications on file prior to visit.    BP 134/82   Pulse 81   Temp (!) 96.5 F (35.8 C) (Temporal)   Ht 4\' 11"  (1.499 m)   Wt 179 lb 8 oz (81.4 kg)   SpO2 95%   BMI 36.25 kg/m    Objective:   Physical Exam  Constitutional: She appears well-nourished.  Skin: No erythema.  Soft area of subcutaneous mass to right lateral trunk under bra line. Non tender. Soft. Immobile.            Assessment & Plan:

## 2019-04-30 NOTE — Assessment & Plan Note (Signed)
Using alprazolam sparingly, refill provided today. Discussed that we cannot refill for one year. Next refill could be in April 2022.

## 2019-04-30 NOTE — Assessment & Plan Note (Signed)
Appears to be collection of fatty tissue. Will obtain ultrasound for definite diagnosis. Orders placed for soft tissue US.

## 2019-05-06 ENCOUNTER — Other Ambulatory Visit: Payer: Self-pay

## 2019-05-06 ENCOUNTER — Ambulatory Visit
Admission: RE | Admit: 2019-05-06 | Discharge: 2019-05-06 | Disposition: A | Discharge status: Home / Self Care | Payer: Medicare Other | Source: Ambulatory Visit | Attending: Primary Care | Admitting: Primary Care

## 2019-05-06 DIAGNOSIS — R229 Localized swelling, mass and lump, unspecified: Secondary | ICD-10-CM | POA: Diagnosis not present

## 2019-05-13 ENCOUNTER — Ambulatory Visit: Payer: Medicare Other | Admitting: Internal Medicine

## 2019-05-15 ENCOUNTER — Ambulatory Visit: Payer: Medicare Other | Admitting: Internal Medicine

## 2019-05-19 ENCOUNTER — Other Ambulatory Visit: Payer: Self-pay | Admitting: Primary Care

## 2019-05-19 DIAGNOSIS — Z1231 Encounter for screening mammogram for malignant neoplasm of breast: Secondary | ICD-10-CM

## 2019-05-22 ENCOUNTER — Ambulatory Visit (INDEPENDENT_AMBULATORY_CARE_PROVIDER_SITE_OTHER): Payer: Medicare Other | Admitting: Internal Medicine

## 2019-05-22 ENCOUNTER — Encounter: Payer: Self-pay | Admitting: Internal Medicine

## 2019-05-22 ENCOUNTER — Other Ambulatory Visit: Payer: Self-pay

## 2019-05-22 VITALS — BP 160/70 | HR 90 | Ht <= 58 in | Wt 181.1 lb

## 2019-05-22 DIAGNOSIS — I5032 Chronic diastolic (congestive) heart failure: Secondary | ICD-10-CM

## 2019-05-22 DIAGNOSIS — I10 Essential (primary) hypertension: Secondary | ICD-10-CM | POA: Diagnosis not present

## 2019-05-22 DIAGNOSIS — J449 Chronic obstructive pulmonary disease, unspecified: Secondary | ICD-10-CM

## 2019-05-22 MED ORDER — LOSARTAN POTASSIUM 100 MG PO TABS: 100.0000 mg | ORAL_TABLET | Freq: Every day | ORAL | 3 refills | Status: DC

## 2019-05-22 NOTE — Progress Notes (Signed)
Follow-up Outpatient Visit Date: 05/22/2019  Primary Care Provider: Pleas Koch, NP [place] [id number]  Chief Complaint: Dyspnea on exertion  HPI:  Teresa Jensen is a 74 y.o. female with history of chronic HFpEF, hypertension, COPD, polymyalgia rheumatica, and GERD, who presents for follow-up of shortness of breath.  I met her in 11/2018 following hospitalization for shortness of breath and weakness.  Chest radiograph suggested pulmonary edema.  Echo showed preserved LVEF with grade 1 diastolic dysfunction.  At the time of our visit in November, she was back to baseline with chronic exertional dyspnea that she has attributed to his COPD.  Subsequent myocardial perfusion stress test was low risk without evidence of ischemia.  Today, Teresa Jensen reports that she is doing well.  She has stable exertional dyspnea, especially when outside in the heat, which has been unchanged for years.  She denies chest pain, palpitations, lightheadedness, and edema.  She wears compression stockings regularly due to dermatitis on both legs.  --------------------------------------------------------------------------------------------------  Cardiovascular History & Procedures: Cardiovascular Problems:  HFpEF  Risk Factors:  Hypertension, age >1  Cath/PCI:  None  CV Surgery:  None  EP Procedures and Devices:  None  Non-Invasive Evaluation(s):  Pharmacologic MPI (11/26/2018): Low risk, probably normal myocardial perfusion stress test.  There is small in size, mild severity, fixed apical anterior and apical defect most consistent with artifact but cannot rule out subtle scar.  No ischemia.  LVEF 56%.  No significant coronary artery calcification.  TTE (02/16/2018): Normal LV size and wall thickness.  LVEF 60-65% with grade 1 diastolic dysfunction   Normal RV size and function.  Mild mitral regurgitation.  Recent CV Pertinent Labs: Lab Results  Component Value Date   CHOL 167 10/24/2018   HDL 43.90 10/24/2018   LDLCALC 95 10/24/2018   TRIG 143.0 10/24/2018   CHOLHDL 4 10/24/2018   INR 1.16 01/07/2017   BNP 248.0 (H) 02/15/2018   K 4.2 11/26/2018   MG 2.3 01/13/2017   BUN 11 11/26/2018   CREATININE 0.58 11/26/2018    Past medical and surgical history were reviewed and updated in EPIC.  Current Meds  Medication Sig  . albuterol (PROVENTIL HFA;VENTOLIN HFA) 108 (90 Base) MCG/ACT inhaler INHALE 2 PUFFS INTO THE LUNGS EVERY 4 (FOUR) HOURS AS NEEDED FOR SHORTNESS OF BREATH.  Marland Kitchen ALPRAZolam (XANAX) 0.25 MG tablet Take 1 tablet by mouth daily as needed for anxiety.  . clobetasol ointment (TEMOVATE) 7.62 % Apply 1 application topically 2 (two) times daily.  Marland Kitchen levothyroxine (SYNTHROID) 75 MCG tablet Take 1 tablet by mouth every morning on empty stomach with a  glass of water only.  Marland Kitchen losartan (COZAAR) 50 MG tablet Take 1 tablet (50 mg total) by mouth daily.  . pentoxifylline (TRENTAL) 400 MG CR tablet Take 1 tablet by mouth 3 (three) times daily.  . tacrolimus (PROTOPIC) 0.1 % ointment Apply  as directed to affected area twice a day  . Vitamin D, Ergocalciferol, (DRISDOL) 1.25 MG (50000 UNIT) CAPS capsule TAKE 1 CAPSULE BY MOUTH ONCE WEEKLY FOR 12 WEEKS.    Allergies: Prednisone, Bisphosphonates, Prevnar 13 [pneumococcal 13-val conj vacc], and Food  Social History   Tobacco Use  . Smoking status: Former Smoker    Packs/day: 1.50    Years: 20.00    Pack years: 30.00    Types: Cigarettes    Quit date: 02/11/2009    Years since quitting: 10.2  . Smokeless tobacco: Never Used  Substance Use Topics  .  Alcohol use: No    Alcohol/week: 0.0 standard drinks  . Drug use: No    Family History  Problem Relation Age of Onset  . Heart attack Mother 38  . Cancer Father        lung/chest wall   . Prostate cancer Father   . Diabetes Brother        siblings  . Hypertension Sister        x 2  . Heart disease Brother   . Breast cancer Paternal Grandmother    . Heart disease Sister   . Diabetes Sister   . Tuberculosis Sister   . Cirrhosis Brother     Review of Systems: A 12-system review of systems was performed and was negative except as noted in the HPI.  --------------------------------------------------------------------------------------------------  Physical Exam: BP (!) 160/70 (BP Location: Left Arm, Patient Position: Sitting, Cuff Size: Large)   Pulse 90   Ht 4' 9"  (1.448 m)   Wt 181 lb 2 oz (82.2 kg)   SpO2 96%   BMI 39.20 kg/m   General: NAD. Neck: No JVD or HJR. Lungs: Mildly diminished breath sounds throughout without wheezes or crackles. Heart: Regular rate and rhythm with S4 noted.  No murmurs. Abdomen: Soft, nontender, nondistended. Extremities: Compression stockings in place without significant edema.  EKG: Normal sinus rhythm without abnormality.  Lab Results  Component Value Date   WBC 10.5 10/24/2018   HGB 14.6 10/24/2018   HCT 44.3 10/24/2018   MCV 90.0 10/24/2018   PLT 306.0 10/24/2018    Lab Results  Component Value Date   NA 138 11/26/2018   K 4.2 11/26/2018   CL 101 11/26/2018   CO2 26 11/26/2018   BUN 11 11/26/2018   CREATININE 0.58 11/26/2018   GLUCOSE 120 (H) 11/26/2018   ALT 25 10/24/2018    Lab Results  Component Value Date   CHOL 167 10/24/2018   HDL 43.90 10/24/2018   [place] 95 10/24/2018   TRIG 143.0 10/24/2018   CHOLHDL 4 10/24/2018    --------------------------------------------------------------------------------------------------  ASSESSMENT AND PLAN: Chronic HFpEF: Teresa Jensen appears euvolemic with stable exertional dyspnea driven predominantly by her underlying obstructive lung disease.  Suboptimally controlled hypertension may be contributing to some of her symptoms, given grade 1 diastolic dysfunction noted on prior echo.  I have stressed the importance of blood pressure control as well as sodium restriction.  We will increase losartan to 100 mg daily.  No further  testing or intervention at this time.  Hypertension: Blood pressure suboptimally controlled.  Will increase losartan to 100 mg daily and plan for follow-up in the office in 1 month with an APP.  BMP should be checked at that time to ensure stable renal function and electrolytes.  COPD: Likely driving most of Teresa Jensen is dyspnea.  Continue current medications and ongoing follow-up with pulmonary and PCP.  Follow-up: Return to clinic in 1 month for labs and blood pressure check with APP.  Nelva Bush, MD 05/22/2019 9:08 AM

## 2019-05-22 NOTE — Patient Instructions (Signed)
Medication Instructions:  Your physician has recommended you make the following change in your medication:  1- INCREASE Losartan to 100 mg by mouth once a day.  *If you need a refill on your cardiac medications before your next appointment, please call your pharmacy*   Follow-Up: At [facility], you and your health needs are our priority.  As part of our continuing mission to provide you with exceptional heart care, we have created designated Provider Care Teams.  These Care Teams include your primary Cardiologist (physician) and Advanced Practice Providers (APPs -  Physician Assistants and Nurse Practitioners) who all work together to provide you with the care you need, when you need it.  We recommend signing up for the patient portal called "MyChart".  Sign up information is provided on this After Visit Summary.  MyChart is used to connect with patients for Virtual Visits (Telemedicine).  Patients are able to view lab/test results, encounter notes, upcoming appointments, etc.  Non-urgent messages can be sent to your provider as well.   To learn more about what you can do with MyChart, go to [email or web address].    Your next appointment:   1 month(s)  The format for your next appointment:   In Person  Provider:    You may see one of the following Advanced Practice Providers on your designated Care Team:    Murray Hodgkins, NP  Christell Faith, PA-C  Marrianne Mood, PA-C

## 2019-06-15 NOTE — Progress Notes (Deleted)
Office Visit    Patient Name: Teresa Jensen Date of Encounter: 06/15/2019  Primary Care Provider:  Pleas Koch, NP Primary Cardiologist:  Nelva Bush, MD  Chief Complaint    No chief complaint on file.  74 year old female with history of HFpEF, hypertension, mild MR, b/l mild carotid artery dz (2019 study below), COPD, polymyalgia rheumatica, and GERD, and who presents for follow-up of hypertension.  Past Medical History    Past Medical History:  Diagnosis Date  . Anxiety   . CAP (community acquired pneumonia)   . COPD (chronic obstructive pulmonary disease) ([place])   . Epistaxis 01/09/2017  . GERD (gastroesophageal reflux disease)   . Hypertension   . New onset of headaches   . Osteopenia   . PMR (polymyalgia rheumatica) ([facility])   . Pneumonia 02/13/2018  . Stasis dermatitis   . Strain of chest wall 01/20/2011  . Tobacco abuse    Past Surgical History:  Procedure Laterality Date  . ABDOMINAL HYSTERECTOMY    . APPENDECTOMY    . BACK SURGERY    . CARPAL TUNNEL RELEASE Left   . CERVICAL FUSION    . CHOLECYSTECTOMY    . HEMORRHOID SURGERY    . LUMBAR FUSION    . TUBAL LIGATION      Allergies  Allergies  Allergen Reactions  . Prednisone Anaphylaxis, Hives, Shortness Of Breath, Itching and Swelling    The face swells  . Bisphosphonates Other (See Comments)    Unsure  . Prevnar 13 [Pneumococcal 13-Val Conj Vacc] Other (See Comments)    Reaction not recalled by the patient  . Food Rash    MANGO = cause rashes around the mouth    History of Present Illness    Teresa Jensen is a 74 y.o. female with PMH as above.  She was admitted 11/2018 for shortness of breath and weakness.  Chest x-ray showed pulmonary edema.  Echo showed normal LVEF with G1DD.  Subsequent stress testing was ruled low risk without evidence of ischemia.  At her last visit on 05/22/2019 with her primary cardiologist, she reported stable exertional dyspnea thought most likely driven  by her underlying COPD.  Her DOE was especially noted when outside in the heat but unchanged for years. It was thought that her suboptimal BP may also be contributing to her symptoms with ARB increase to losartan 100 mg daily. She was wearing compression stockings regularly due to dermatitis.  --Recheck BMET following ARB  increase  Home Medications    Prior to Admission medications   Medication Sig Start Date End Date Taking? Authorizing Provider  albuterol (PROVENTIL HFA;VENTOLIN HFA) 108 (90 Base) MCG/ACT inhaler INHALE 2 PUFFS INTO THE LUNGS EVERY 4 (FOUR) HOURS AS NEEDED FOR SHORTNESS OF BREATH. 02/17/18   Pleas Koch, NP  ALPRAZolam Duanne Moron) 0.25 MG tablet Take 1 tablet by mouth daily as needed for anxiety. 04/30/19   Pleas Koch, NP  clobetasol ointment (TEMOVATE) 5.64 % Apply 1 application topically 2 (two) times daily. 04/29/19   Brendolyn Patty, MD  levothyroxine (SYNTHROID) 75 MCG tablet Take 1 tablet by mouth every morning on empty stomach with a  glass of water only. 10/28/18   Pleas Koch, NP  losartan (COZAAR) 100 MG tablet Take 1 tablet (100 mg total) by mouth daily. 05/22/19 08/20/19  End, Harrell Gave, MD  pentoxifylline (TRENTAL) 400 MG CR tablet Take 1 tablet by mouth 3 (three) times daily. 10/30/18   [provider]  tacrolimus ([place])  0.1 % ointment Apply  as directed to affected area twice a day 04/29/19   Brendolyn Patty, MD  Vitamin D, Ergocalciferol, (DRISDOL) 1.25 MG (50000 UNIT) CAPS capsule TAKE 1 CAPSULE BY MOUTH ONCE WEEKLY FOR 12 WEEKS. 04/18/19   Pleas Koch, NP    Review of Systems    ***.   All other systems reviewed and are otherwise negative except as noted above.  Physical Exam    VS:  There were no vitals taken for this visit. , BMI There is no height or weight on file to calculate BMI. GEN: Well nourished, well developed, in no acute distress. HEENT: normal. Neck: Supple, no JVD, carotid bruits, or masses. Cardiac: RRR, no  murmurs, rubs, or gallops. No clubbing, cyanosis, edema.  Radials/DP/PT 2+ and equal bilaterally.  Respiratory:  Respirations regular and unlabored, clear to auscultation bilaterally. GI: Soft, nontender, nondistended, BS + x 4. MS: no deformity or atrophy. Skin: warm and dry, no rash. Neuro:  Strength and sensation are intact. Psych: Normal affect.  Accessory Clinical Findings    ECG personally reviewed by me today - *** - no acute changes.  VITALS Reviewed today   Temp Readings from Last 3 Encounters:  04/30/19 (!) 96.5 F (35.8 C) (Temporal)  10/24/18 (!) 97.4 F (36.3 C) (Temporal)  04/17/18 97.7 F (36.5 C) (Oral)   BP Readings from Last 3 Encounters:  05/22/19 (!) 160/70  04/30/19 134/82  11/12/18 (!) 143/76   Pulse Readings from Last 3 Encounters:  05/22/19 90  04/30/19 81  11/12/18 95    Wt Readings from Last 3 Encounters:  05/22/19 181 lb 2 oz (82.2 kg)  04/30/19 179 lb 8 oz (81.4 kg)  11/12/18 184 lb 12 oz (83.8 kg)     LABS  reviewed today   Lab Results  Component Value Date   WBC 10.5 10/24/2018   HGB 14.6 10/24/2018   HCT 44.3 10/24/2018   MCV 90.0 10/24/2018   PLT 306.0 10/24/2018   Lab Results  Component Value Date   CREATININE 0.58 11/26/2018   BUN 11 11/26/2018   NA 138 11/26/2018   K 4.2 11/26/2018   CL 101 11/26/2018   CO2 26 11/26/2018   Lab Results  Component Value Date   ALT 25 10/24/2018   AST 23 10/24/2018   ALKPHOS 74 10/24/2018   BILITOT 0.4 10/24/2018   Lab Results  Component Value Date   CHOL 167 10/24/2018   HDL 43.90 10/24/2018   LDLCALC 95 10/24/2018   TRIG 143.0 10/24/2018   CHOLHDL 4 10/24/2018    Lab Results  Component Value Date   HGBA1C 6.1 10/24/2018   Lab Results  Component Value Date   TSH 1.72 10/24/2018     STUDIES/PROCEDURES reviewed today   ***  Echo 02/16/2018 1. The left ventricle has normal systolic function with an ejection  fraction of 60-65%. The cavity size was normal. Left  ventricular diastolic  Doppler parameters are consistent with impaired relaxation.  2. The right ventricle has normal systolic function. The cavity was  normal. There is no increase in right ventricular wall thickness.  3. Right atrial pressure is estimated at 10 mmHg.  4. Unable to estimate RVSP   US carotid bilateral Summary:  Right Carotid: Velocities in the right ICA are consistent with a 1-39%  stenosis. Non-hemodynamically significant plaque <50% noted in the  CCA. The  ECA appears <50% stenosed. Stable velocities compared to  previous exam.  Left Carotid: Velocities in the  left ICA are consistent with a 1-39%  stenosis. Non-hemodynamically significant plaque noted in the CCA. The  ECA appears <50% stenosed. Stable velocities compared to  previousexam.  Vertebrals: Bilateral vertebral arteries demonstrate antegrade flow.  Subclavians: Normal flow hemodynamics were seen in bilateral subclavian        arteries.  ---------------------  Cardiovascular History & Procedures: Cardiovascular Problems:  HFpEF  Risk Factors:  Hypertension, age >44  Cath/PCI:  None  CV Surgery:  None  EP Procedures and Devices:  None  Non-Invasive Evaluation(s):  Pharmacologic MPI (11/26/2018): Low risk, probably normal myocardial perfusion stress test.  There is small in size, mild severity, fixed apical anterior and apical defect most consistent with artifact but cannot rule out subtle scar.  No ischemia.  LVEF 56%.  No significant coronary artery calcification.  TTE (02/16/2018): Normal LV size and wall thickness. LVEF 60-65% with grade 1 diastolic dysfunction Normal RV size and function. Mild mitral regurgitation.  Assessment & Plan    ***  Medication changes: *** Labs ordered: *** Studies / Imaging ordered: *** Future considerations: *** Disposition: ***  Total time spent with patient today *** minutes. This includes reviewing records, evaluating the  patient, and coordinating care. Face-to-face time >50%.    Arvil Chaco, PA-C 06/15/2019

## 2019-06-22 ENCOUNTER — Ambulatory Visit: Payer: Medicare Other | Admitting: Physician Assistant

## 2019-06-29 ENCOUNTER — Other Ambulatory Visit: Payer: Self-pay

## 2019-06-29 ENCOUNTER — Encounter: Payer: Self-pay | Admitting: Family

## 2019-06-29 ENCOUNTER — Ambulatory Visit (INDEPENDENT_AMBULATORY_CARE_PROVIDER_SITE_OTHER): Payer: Medicare Other | Admitting: Family

## 2019-06-29 VITALS — BP 136/70 | HR 84 | Ht <= 58 in | Wt 184.1 lb

## 2019-06-29 DIAGNOSIS — J449 Chronic obstructive pulmonary disease, unspecified: Secondary | ICD-10-CM | POA: Diagnosis not present

## 2019-06-29 DIAGNOSIS — I5032 Chronic diastolic (congestive) heart failure: Secondary | ICD-10-CM | POA: Diagnosis not present

## 2019-06-29 DIAGNOSIS — I10 Essential (primary) hypertension: Secondary | ICD-10-CM

## 2019-06-29 MED ORDER — LOSARTAN POTASSIUM 100 MG PO TABS: 100.0000 mg | ORAL_TABLET | Freq: Every day | ORAL | 1 refills | Status: DC

## 2019-06-29 NOTE — Progress Notes (Signed)
Office Visit    Patient Name: Teresa Jensen Date of Encounter: 06/29/2019  Primary Care Provider:  Pleas Koch, NP Primary Cardiologist:  Nelva Bush, MD Electrophysiologist:  None   Chief Complaint    Teresa Jensen is a 74 y.o. female with a hx of HFpEF, HTN, COPD, polymyalgia rheumatica, GERD presents today for one month follow up of BP after increased Losartan.   Past Medical History    Past Medical History:  Diagnosis Date  . Anxiety   . CAP (community acquired pneumonia)   . COPD (chronic obstructive pulmonary disease) ([place])   . Epistaxis 01/09/2017  . GERD (gastroesophageal reflux disease)   . Hypertension   . New onset of headaches   . Osteopenia   . PMR (polymyalgia rheumatica) ([facility])   . Pneumonia 02/13/2018  . Stasis dermatitis   . Strain of chest wall 01/20/2011  . Tobacco abuse    Past Surgical History:  Procedure Laterality Date  . ABDOMINAL HYSTERECTOMY    . APPENDECTOMY    . BACK SURGERY    . CARPAL TUNNEL RELEASE Left   . CERVICAL FUSION    . CHOLECYSTECTOMY    . HEMORRHOID SURGERY    . LUMBAR FUSION    . TUBAL LIGATION      Allergies  Allergies  Allergen Reactions  . Prednisone Anaphylaxis, Hives, Shortness Of Breath, Itching and Swelling    The face swells  . Bisphosphonates Other (See Comments)    Unsure  . Prevnar 13 [Pneumococcal 13-Val Conj Vacc] Other (See Comments)    Reaction not recalled by the patient  . Food Rash    MANGO = cause rashes around the mouth    History of Present Illness    Teresa Jensen is a 74 y.o. female with a hx of HFpEF, COPD, HTN, polymyalgia rheumatica, GERD last seen 05/22/2019 by Dr. Saunders Revel.  Initially seen 11/2018 by Dr. Saunders Revel following hospitalization for shortness of breath and weakness.  Chest x-ray suggestive of pulmonary edema.  Echocardiogram showed preserved LVEF with grade 1 diastolic dysfunction.  She did subsequent myocardial perfusion stress test which was low risk without  evidence of ischemia.  At last clinic visit 05/22/2019 she reported stable exertional dyspnea.  Her losartan was increased to 100 mg daily due to suboptimal control of her blood pressure.  Reports feeling overall well since last visit. Enjoys spending time with her grandkids and helps take care of them.   BP at home 124-136/70s with arm blood pressure cuff when checked at home.  Reports some stomach upset with the first 2 weeks of increased dose of losartan but this is since resolved.  Reports no episodes of hypotension at home, no lightheadedness, dizziness, near-syncope, syncope.  Reports no shortness of breath at rest.  Reports her dyspnea on exertion is stable. Reports no chest pain, pressure, or tightness. No edema, orthopnea, PND. Reports no palpitations.    EKGs/Labs/Other Studies Reviewed:   The following studies were reviewed today:  EKG:  EKG is  ordered today.  The ekg ordered today demonstrates NSR 84 bpm with no acute ST/T wave changes.   Recent Labs: 10/24/2018: ALT 25; Hemoglobin 14.6; Platelets 306.0; TSH 1.72 11/26/2018: BUN 11; Creatinine, Ser 0.58; Potassium 4.2; Sodium 138  Recent Lipid Panel    Component Value Date/Time   CHOL 167 10/24/2018 0822   TRIG 143.0 10/24/2018 0822   HDL 43.90 10/24/2018 0822   CHOLHDL 4 10/24/2018 0822   VLDL 28.6 10/24/2018 6659  [place] 95 10/24/2018 0822   Home Medications   Current Meds  Medication Sig  . albuterol (PROVENTIL HFA;VENTOLIN HFA) 108 (90 Base) MCG/ACT inhaler INHALE 2 PUFFS INTO THE LUNGS EVERY 4 (FOUR) HOURS AS NEEDED FOR SHORTNESS OF BREATH.  Marland Kitchen ALPRAZolam (XANAX) 0.25 MG tablet Take 1 tablet by mouth daily as needed for anxiety.  . clobetasol ointment (TEMOVATE) 6.83 % Apply 1 application topically 2 (two) times daily.  Marland Kitchen levothyroxine (SYNTHROID) 75 MCG tablet Take 1 tablet by mouth every morning on empty stomach with a  glass of water only.  Marland Kitchen losartan (COZAAR) 100 MG tablet Take 1 tablet (100 mg total) by  mouth daily.  . tacrolimus (PROTOPIC) 0.1 % ointment Apply  as directed to affected area twice a day   Review of Systems   Review of Systems  Constitutional: Negative for chills, fever and malaise/fatigue.  Cardiovascular: Positive for dyspnea on exertion. Negative for chest pain, leg swelling, near-syncope, orthopnea, palpitations and syncope.  Respiratory: Negative for cough, shortness of breath and wheezing.   Gastrointestinal: Negative for nausea and vomiting.  Neurological: Negative for dizziness, light-headedness and weakness.   All other systems reviewed and are otherwise negative except as noted above.  Physical Exam    VS:  BP (!) 162/66 (BP Location: Left Arm, Patient Position: Sitting, Cuff Size: Normal)   Pulse 84   Ht 4\' 9"  (1.448 m)   Wt 184 lb 2 oz (83.5 kg)   SpO2 94%   BMI 39.84 kg/m  , BMI Body mass index is 39.84 kg/m. GEN: Well nourished, overweight, well developed, in no acute distress. HEENT: normal. Neck: Supple, no JVD, carotid bruits, or masses. Cardiac: RRR, no murmurs, rubs, or gallops. No clubbing, cyanosis, edema.  Radials/DP/PT 2+ and equal bilaterally.  Respiratory:  Respirations regular and unlabored, clear to auscultation bilaterally. GI: Soft, nontender, nondistended, BS + x 4. MS: No deformity or atrophy. Skin: Warm and dry, no rash. Neuro:  Strength and sensation are intact. Psych: Normal affect.  Assessment & Plan    1. HFpEF - Euvolemic and well compensated on exam. DOE is stable at her baseline. GDMT includes ARB. No indication for loop diuretic at this time. Continues to wear her compression stockings.  Continue low-sodium, healthy diet.  Continue regular cardiovascular exercise.  2. HTN - BP improved since Losartan increased to 100mg  daily.  BP routinely at goal on home cuff.  She will continue to monitor at home.  BMET today.   3. COPD -likely etiology of most of her dyspnea.  Continue to follow with pulmonology and  PCP.  Disposition: Follow up in 3 month(s) with Dr. Saunders Revel or APP   Loel Dubonnet, NP 06/29/2019, 8:43 AM

## 2019-06-29 NOTE — Patient Instructions (Addendum)
Medication Instructions:  No medication changes today.   *If you need a refill on your cardiac medications before your next appointment, please call your pharmacy*  Lab Work: Your physician recommends that you have lab work today: BMET  If you have labs (blood work) drawn today and your tests are completely normal, you will receive your results only by: Marland Kitchen MyChart Message (if you have MyChart) OR . A paper copy in the mail If you have any lab test that is abnormal or we need to change your treatment, we will call you to review the results.   Testing/Procedures: Your EKG today shows normal sinus rhythm.   Follow-Up: At [facility], you and your health needs are our priority.  As part of our continuing mission to provide you with exceptional heart care, we have created designated Provider Care Teams.  These Care Teams include your primary Cardiologist (physician) and Advanced Practice Providers (APPs -  Physician Assistants and Nurse Practitioners) who all work together to provide you with the care you need, when you need it.  We recommend signing up for the patient portal called "MyChart".  Sign up information is provided on this After Visit Summary.  MyChart is used to connect with patients for Virtual Visits (Telemedicine).  Patients are able to view lab/test results, encounter notes, upcoming appointments, etc.  Non-urgent messages can be sent to your provider as well.   To learn more about what you can do with MyChart, go to [email or web address].    Your next appointment:   3-4 month(s)  The format for your next appointment:   In Person  Provider:   You may see Nelva Bush, MD or one of the following Advanced Practice Providers on your designated Care Team:    Murray Hodgkins, NP  Christell Faith, PA-C  Marrianne Mood, PA-C  Laurann Montana, NP  Other Instructions  Our goal is for your blood pressure to be 130/80 or below.  If your blood pressure is consistently  more than 130/80, please call our office and let us know.

## 2019-06-30 LAB — BASIC METABOLIC PANEL
BUN/Creatinine Ratio: 22 (ref 12–28)
BUN: 17 mg/dL (ref 8–27)
CO2: 24 mmol/L (ref 20–29)
Calcium: 9.3 mg/dL (ref 8.7–10.3)
Chloride: 98 mmol/L (ref 96–106)
Creatinine, Ser: 0.76 mg/dL (ref 0.57–1.00)
GFR calc Af Amer: 89 mL/min/{1.73_m2} (ref >59)
GFR calc non Af Amer: 78 mL/min/{1.73_m2} (ref >59)
Glucose: 155 mg/dL — ABNORMAL HIGH (ref 65–99)
Potassium: 4.4 mmol/L (ref 3.5–5.2)
Sodium: 138 mmol/L (ref 134–144)

## 2019-07-02 ENCOUNTER — Ambulatory Visit
Admission: RE | Admit: 2019-07-02 | Discharge: 2019-07-02 | Disposition: A | Discharge status: Home / Self Care | Payer: Medicare Other | Source: Ambulatory Visit | Attending: Primary Care | Admitting: Primary Care

## 2019-07-02 DIAGNOSIS — Z1231 Encounter for screening mammogram for malignant neoplasm of breast: Secondary | ICD-10-CM | POA: Diagnosis not present

## 2019-08-16 DIAGNOSIS — G5603 Carpal tunnel syndrome, bilateral upper limbs: Secondary | ICD-10-CM | POA: Insufficient documentation

## 2019-08-24 ENCOUNTER — Encounter: Payer: Self-pay | Admitting: Dermatology

## 2019-08-24 NOTE — Telephone Encounter (Signed)
Allergies

## 2019-08-25 ENCOUNTER — Encounter
Admission: RE | Admit: 2019-08-25 | Discharge: 2019-08-25 | Disposition: A | Discharge status: Home / Self Care | Payer: Medicare Other | Source: Ambulatory Visit | Attending: Orthopedic Surgery | Admitting: Orthopedic Surgery

## 2019-08-25 ENCOUNTER — Other Ambulatory Visit: Payer: Self-pay

## 2019-08-25 DIAGNOSIS — Z01818 Encounter for other preprocedural examination: Secondary | ICD-10-CM | POA: Insufficient documentation

## 2019-08-25 HISTORY — DX: Dyspnea, unspecified: R06.00

## 2019-08-25 HISTORY — DX: Hypothyroidism, unspecified: E03.9

## 2019-08-25 HISTORY — DX: Nausea with vomiting, unspecified: R11.2

## 2019-08-25 HISTORY — DX: Other specified postprocedural states: Z98.890

## 2019-08-25 NOTE — Pre-Procedure Instructions (Signed)
Loel Dubonnet, NP  Nurse Practitioner  Cardiology  Progress Notes    Signed  Encounter Date:  06/29/2019          Signed      Expand All Collapse All  Show:Clear all [x] Manual[x] Template[] Copied  Added by: [x] Loel Dubonnet, NP  [] Hover for details    Office Visit    Patient Name: Teresa Jensen Date of Encounter: 06/29/2019  Primary Care Provider:  Pleas Koch, NP Primary Cardiologist:  Nelva Bush, MD Electrophysiologist:  None   Chief Complaint    Teresa Jensen is a 74 y.o. female with a hx of HFpEF, HTN, COPD, polymyalgia rheumatica, GERD presents today for one month follow up of BP after increased Losartan.   Past Medical History        Past Medical History:  Diagnosis Date   Anxiety    CAP (community acquired pneumonia)    COPD (chronic obstructive pulmonary disease) ([place])    Epistaxis 01/09/2017   GERD (gastroesophageal reflux disease)    Hypertension    New onset of headaches    Osteopenia    PMR (polymyalgia rheumatica) ([facility])    Pneumonia 02/13/2018   Stasis dermatitis    Strain of chest wall 01/20/2011   Tobacco abuse         Past Surgical History:  Procedure Laterality Date   ABDOMINAL HYSTERECTOMY     APPENDECTOMY     BACK SURGERY     CARPAL TUNNEL RELEASE Left    CERVICAL FUSION     CHOLECYSTECTOMY     HEMORRHOID SURGERY     LUMBAR FUSION     TUBAL LIGATION      Allergies       Allergies  Allergen Reactions   Prednisone Anaphylaxis, Hives, Shortness Of Breath, Itching and Swelling    The face swells   Bisphosphonates Other (See Comments)    Unsure   Prevnar 13 [Pneumococcal 13-Val Conj Vacc] Other (See Comments)    Reaction not recalled by the patient   Food Rash    MANGO = cause rashes around the mouth    History of Present Illness    Teresa Jensen is a 74 y.o. female with a hx of HFpEF, COPD, HTN, polymyalgia rheumatica,  GERD last seen 05/22/2019 by Dr. Saunders Revel.  Initially seen 11/2018 by Dr. Saunders Revel following hospitalization for shortness of breath and weakness.  Chest x-ray suggestive of pulmonary edema.  Echocardiogram showed preserved LVEF with grade 1 diastolic dysfunction.  She did subsequent myocardial perfusion stress test which was low risk without evidence of ischemia.  At last clinic visit 05/22/2019 she reported stable exertional dyspnea.  Her losartan was increased to 100 mg daily due to suboptimal control of her blood pressure.  Reports feeling overall well since last visit. Enjoys spending time with her grandkids and helps take care of them.   BP at home 124-136/70s with arm blood pressure cuff when checked at home.  Reports some stomach upset with the first 2 weeks of increased dose of losartan but this is since resolved.  Reports no episodes of hypotension at home, no lightheadedness, dizziness, near-syncope, syncope.  Reports no shortness of breath at rest.  Reports her dyspnea on exertion is stable. Reports no chest pain, pressure, or tightness. No edema, orthopnea, PND. Reports no palpitations.    EKGs/Labs/Other Studies Reviewed:   The following studies were reviewed today:  EKG:  EKG is  ordered today.  The ekg ordered today demonstrates NSR 84  bpm with no acute ST/T wave changes.   Recent Labs: 10/24/2018: ALT 25; Hemoglobin 14.6; Platelets 306.0; TSH 1.72 11/26/2018: BUN 11; Creatinine, Ser 0.58; Potassium 4.2; Sodium 138  Recent Lipid Panel Labs (Brief)          Component Value Date/Time   CHOL 167 10/24/2018 0822   TRIG 143.0 10/24/2018 0822   HDL 43.90 10/24/2018 0822   CHOLHDL 4 10/24/2018 0822   VLDL 28.6 10/24/2018 0822   LDLCALC 95 10/24/2018 0822     Home Medications   Active Medications      Current Meds  Medication Sig   albuterol (PROVENTIL HFA;VENTOLIN HFA) 108 (90 Base) MCG/ACT inhaler INHALE 2 PUFFS INTO THE LUNGS EVERY 4 (FOUR) HOURS AS NEEDED  FOR SHORTNESS OF BREATH.   ALPRAZolam (XANAX) 0.25 MG tablet Take 1 tablet by mouth daily as needed for anxiety.   clobetasol ointment (TEMOVATE) 1.49 % Apply 1 application topically 2 (two) times daily.   levothyroxine (SYNTHROID) 75 MCG tablet Take 1 tablet by mouth every morning on empty stomach with a  glass of water only.   losartan (COZAAR) 100 MG tablet Take 1 tablet (100 mg total) by mouth daily.   tacrolimus (PROTOPIC) 0.1 % ointment Apply  as directed to affected area twice a day     Review of Systems   Review of Systems  Constitutional: Negative for chills, fever and malaise/fatigue.  Cardiovascular: Positive for dyspnea on exertion. Negative for chest pain, leg swelling, near-syncope, orthopnea, palpitations and syncope.  Respiratory: Negative for cough, shortness of breath and wheezing.   Gastrointestinal: Negative for nausea and vomiting.  Neurological: Negative for dizziness, light-headedness and weakness.   All other systems reviewed and are otherwise negative except as noted above.  Physical Exam    VS:  BP (!) 162/66 (BP Location: Left Arm, Patient Position: Sitting, Cuff Size: Normal)    Pulse 84    Ht 4\' 9"  (1.448 m)    Wt 184 lb 2 oz (83.5 kg)    SpO2 94%    BMI 39.84 kg/m  , BMI Body mass index is 39.84 kg/m. GEN: Well nourished, overweight, well developed, in no acute distress. HEENT: normal. Neck: Supple, no JVD, carotid bruits, or masses. Cardiac: RRR, no murmurs, rubs, or gallops. No clubbing, cyanosis, edema.  Radials/DP/PT 2+ and equal bilaterally.  Respiratory:  Respirations regular and unlabored, clear to auscultation bilaterally. GI: Soft, nontender, nondistended, BS + x 4. MS: No deformity or atrophy. Skin: Warm and dry, no rash. Neuro:  Strength and sensation are intact. Psych: Normal affect.  Assessment & Plan    1. HFpEF - Euvolemic and well compensated on exam. DOE is stable at her baseline. GDMT includes ARB. No indication for loop  diuretic at this time. Continues to wear her compression stockings.  Continue low-sodium, healthy diet.  Continue regular cardiovascular exercise.  2. HTN - BP improved since Losartan increased to 100mg  daily.  BP routinely at goal on home cuff.  She will continue to monitor at home.  BMET today.   3. COPD -likely etiology of most of her dyspnea.  Continue to follow with pulmonology and PCP.  Disposition: Follow up in 3 month(s) with Dr. Saunders Revel or APP   Loel Dubonnet, NP 06/29/2019, 8:43 AM        Electronically signed by Loel Dubonnet, NP at 06/29/2019 7:47 PM  Office Visit on 06/29/2019   Office Visit on 06/29/2019     Detailed Report    Note  viewed by patient

## 2019-08-25 NOTE — Telephone Encounter (Signed)
Spoke to Teresa Jensen and advised Dr. Nicole Kindred wrote a note with her condition and the limitations.  Advised Teresa Jensen we could mail her the note or she could come by and pick up.  Teresa Jensen advised she will come by tomorrow and get note from the front des,.  I made a copy of note to be scanned in pts chart./sh

## 2019-08-25 NOTE — Patient Instructions (Signed)
Your procedure is scheduled on: 08-31-19 MONDAY Report to Same Day Surgery 2nd floor medical mall [facility] Entrance-take elevator on left to 2nd floor.  Check in with surgery information desk.) To find out your arrival time please call [phone] between 1PM - 3PM on 08-28-19 FRIDAY  Remember: Instructions that are not followed completely may result in serious medical risk, up to and including death, or upon the discretion of your surgeon and anesthesiologist your surgery may need to be rescheduled.    _x___ 1. Do not eat food after midnight the night before your procedure. NO GUM OR CANDY AFTER MIDNIGHT. You may drink clear liquids up to 2 hours before you are scheduled to arrive at the hospital for your procedure.  Do not drink clear liquids within 2 hours of your scheduled arrival to the hospital.  Clear liquids include  --Water or Apple juice without pulp  --Gatorade  --Black Coffee or Clear Tea (No milk, no creamers, do not add anything to the coffee or Tea-OK TO ADD SUGAR)  _X___Ensure clear carbohydrate drink-FINISH DRINK 2 HOURS PRIOR TO ARRIVAL TIME TO HOSPITAL THE DAY OF YOUR SURGERY    __x__ 2. No Alcohol for 24 hours before or after surgery.   __x__3. No Smoking or e-cigarettes for 24 prior to surgery.  Do not use any chewable tobacco products for at least 6 hour prior to surgery   ____  4. Bring all medications with you on the day of surgery if instructed.    __x__ 5. Notify your doctor if there is any change in your medical condition     (cold, fever, infections).    x___6. On the morning of surgery brush your teeth with toothpaste and water.  You may rinse your mouth with mouth wash if you wish.  Do not swallow any toothpaste or mouthwash.   Do not wear jewelry, make-up, hairpins, clips or nail polish.  Do not wear lotions, powders, or perfumes.  Do not shave 48 hours prior to surgery. Men may shave face and neck.  Do not bring valuables to the hospital.    [facility] is not responsible for any belongings or valuables.               Contacts, dentures or bridgework may not be worn into surgery.  Leave your suitcase in the car. After surgery it may be brought to your room.  For patients admitted to the hospital, discharge time is determined by your treatment team.  _  Patients discharged the day of surgery will not be allowed to drive home.  You will need someone to drive you home and stay with you the night of your procedure.    Please read over the following fact sheets that you were given:   [facility] Preparing for Surgery  _x___ TAKE THE FOLLOWING MEDICATION THE MORNING OF SURGERY WITH A SMALL SIP OF WATER. These include:  1. SYNTHROID (LEVOTHYROXINE)  2. YOU MAY TAKE YOUR XANAX (ALPRAZOLAM) THE MORNING OF SURGERY IF NEEDED  3.  4.  5.  6.  ____Fleets enema or Magnesium Citrate as directed.   _x___ Use CHG Soap as directed on instruction sheet   ____ Use inhalers on the day of surgery and bring to hospital day of surgery  ____ Stop Metformin and Janumet 2 days prior to surgery.    ____ Take 1/2 of usual insulin dose the night before surgery and none on the morning surgery.   ____ Follow recommendations from  Cardiologist, Pulmonologist or PCP regarding stopping Aspirin, Coumadin, Plavix ,Eliquis, Effient, or Pradaxa, and Pletal.  X____Stop Anti-inflammatories such as Advil, Aleve, Ibuprofen, Motrin, Naproxen, Naprosyn, Goodies powders or aspirin products NOW-OK to take Tylenol   _x___ Stop supplements until after surgery-STOP YOUR PRESERVISION NOW-YOU MAY RESUME AFTER SURGERY   ____ Bring C-Pap to the hospital.

## 2019-08-26 ENCOUNTER — Ambulatory Visit: Payer: Medicare Other | Admitting: Dermatology

## 2019-08-27 ENCOUNTER — Other Ambulatory Visit
Admission: RE | Admit: 2019-08-27 | Discharge: 2019-08-27 | Disposition: A | Discharge status: Home / Self Care | Payer: Medicare Other | Source: Ambulatory Visit | Attending: Orthopedic Surgery | Admitting: Orthopedic Surgery

## 2019-08-27 ENCOUNTER — Other Ambulatory Visit: Payer: Self-pay

## 2019-08-27 DIAGNOSIS — Z20822 Contact with and (suspected) exposure to covid-19: Secondary | ICD-10-CM | POA: Insufficient documentation

## 2019-08-27 DIAGNOSIS — Z01812 Encounter for preprocedural laboratory examination: Secondary | ICD-10-CM | POA: Diagnosis present

## 2019-08-27 LAB — CBC
HCT: 42.8 % (ref 36.0–46.0)
Hemoglobin: 13.4 g/dL (ref 12.0–15.0)
MCH: 30 pg (ref 26.0–34.0)
MCHC: 31.3 g/dL (ref 30.0–36.0)
MCV: 95.7 fL (ref 80.0–100.0)
Platelets: 304 10*3/uL (ref 150–400)
RBC: 4.47 MIL/uL (ref 3.87–5.11)
RDW: 14.4 % (ref 11.5–15.5)
WBC: 9.6 10*3/uL (ref 4.0–10.5)
nRBC: 0 % (ref 0.0–0.2)

## 2019-08-27 LAB — SARS CORONAVIRUS 2 (TAT 6-24 HRS): SARS Coronavirus 2: NEGATIVE

## 2019-08-30 ENCOUNTER — Encounter: Payer: Self-pay | Admitting: Orthopedic Surgery

## 2019-08-30 NOTE — H&P (Signed)
ORTHOPAEDIC HISTORY & PHYSICAL  Progress Notes Teresa Jensen, [name]., MD - 08/11/2019 10:15 AM EDT Formatting of this note is different from the original. Images from the original note were not included. Chief Complaint: Chief Complaint  Patient presents with  . Carpal Tunnel  Bilateral carpal tunnel syndrome, h/o left CTR   Reason for Visit: The patient is a 74 y.o. female who presents today for reevaluation of both hands. She has a 3-year history of numbness and tingling to both hands, with the left hand more symptomatic than the right. She does have some radiation into both arms. She denies any neck pain. She has remote history of left carpal tunnel release performed in [place], but states that the symptoms have recurred. She has not appreciated any significant improvement despite use of NSAIDs, splinting, and corticosteroid injections.  Medications: Current Outpatient Medications  Medication Sig Dispense Refill  . ALPRAZolam (XANAX) 0.25 MG tablet 0.25 mg nightly as needed  . clobetasol (OLUX) 0.05 % topical foam APPLY 1 APPLICATION TO SKIN TWICE DAILY AS NEEDED. AVOID FACE, GROIN, AND UNDERARMS 1  . ergocalciferol, vitamin D2, 1,250 mcg (50,000 unit) capsule TAKE 1 CAPSULE BY MOUTH ONCE WEEKLY FOR 12 WEEKS.  Marland Kitchen gentamicin (GARAMYCIN) 0.1 % ointment APPLY TO NOSE THREE TIMES DAILY AS DIRECTED  . levothyroxine (SYNTHROID, LEVOTHROID) 75 MCG tablet Take 75 mcg by mouth once daily 3  . losartan (COZAAR) 100 MG tablet Take 100 mg by mouth once daily  . meloxicam (MOBIC) 15 MG tablet Take 15 mg by mouth once daily  . tacrolimus (PROTOPIC) 0.1 % ointment Apply as directed to affected area twice a day  . triamcinolone 0.1 % ointment Apply 1 Application topically as directed  . VITAMIN B-12 1,000 mcg TbER Take 1 tablet by mouth once daily 0   No current facility-administered medications for this visit.   Allergies: Allergies  Allergen Reactions  . Prednisone Anaphylaxis, Hives,  Shortness Of Breath, Itching and Swelling    . Mango Rash  . Pneumococcal 13-Valent Conjugate To Diphtheria Crm Itching   Past Medical History: Past Medical History:  Diagnosis Date  . Chicken pox   Past Surgical History: Past Surgical History:  Procedure Laterality Date  . CARPAL TUNNEL RELEASE Left  approximately 1980  . HYSTERECTOMY  . LAPAROSCOPIC CHOLECYSTECTOMY  . SPINE SURGERY   Social History: Social History   Socioeconomic History  . Marital status: Married  Spouse name: [name]  . Number of children: 1  . Years of education: 94  . Highest education level: High school graduate  Occupational History  . Occupation: Retired- Customer service manager  Tobacco Use  . Smoking status: Former Smoker  Packs/day: 1.00  Years: 30.00  Pack years: 30.00  Quit date: 2014  Years since quitting: 7.6  . Smokeless tobacco: Former Network engineer  . Vaping Use: Never used  Substance and Sexual Activity  . Alcohol use: Not Currently  . Drug use: Never  . Sexual activity: Defer  Partners: Male  Other Topics Concern  . Not on file  Social History Narrative  . Not on file   Social Determinants of Health   Financial Resource Strain:  . Difficulty of Paying Living Expenses:  Food Insecurity:  . Worried About Charity fundraiser in the Last Year:  . Arboriculturist in the Last Year:  Transportation Needs:  . Film/video editor (Medical):  Marland Kitchen Lack of Transportation (Non-Medical):  Physical Activity:  . Days of Exercise per Week:  .  Minutes of Exercise per Session:  Stress:  . Feeling of Stress :  Social Connections:  . Frequency of Communication with Friends and Family:  . Frequency of Social Gatherings with Friends and Family:  . Attends Religious Services:  . Active Member of Clubs or Organizations:  . Attends Archivist Meetings:  Marland Kitchen Marital Status:   Family History: Family History  Problem Relation Age of Onset  . No Known Problems Mother  . No  Known Problems Father  . No Known Problems Sister  . No Known Problems Brother   Review of Systems: A comprehensive 14 point ROS was performed, reviewed, and the pertinent orthopaedic findings are documented in the HPI.  Exam BP 128/76  Temp 36.6 C (97.9 F)  Ht 146.1 cm (4' 9.5")  Wt 84 kg (185 lb 3.2 oz)  BMI 39.38 kg/m   General:  Well-developed, well-nourished female seen in no acute distress.   HEENT:  Atraumatic, normocephalic. Pupils are equal and reactive to light. Extraocular motion is intact. Sclera are clear. Oropharynx is clear with moist mucosa.  Neck: Good range of motion. No tenderness to palpation. Spurling`s test is negative.  Lungs:  Clear to auscultation bilaterally.  Cardiovascular:  Regular rate and rhythm. Normal S1, S2. No murmur . No appreciable gallops or rubs. Peripheral pulses are palpable.   Extremities:  Normal shoulder contour.  Good range of motion and stability of the shoulders, elbows, and wrists. Tinel`s test at the elbow is negative.  Left hand:  Tenderness: Negative Erythema: negative Swelling: negative Capillary Refill: normal Thenar atrophy: Mild Intrinsic wasting: negative Grip strength: fair to good grip strength Pincer strength: fair to good pincer strength Tinel`s test: positive Phalen`s test: positive Triggering: No gross triggering or locking of the digits Finkelstein`s test: negative Range of motion: Good range of motion of the digits  Right hand:  Tenderness: Negative Erythema: Negative Swelling: Negative Capillary Refill: Normal Thenar atrophy: Mild Intrinsic wasting: negative Grip strength: fair to good grip strength Pincer strength: fair to good pincer strength Tinel`s test: positive Phalen`s test: positive Triggering: No gross triggering or locking of the digits Finkelstein`s test: Negative Range of motion: Good range of motion of the digits  Neurologic:  Awake, alert , and oriented.  Sensory  function is intact except for decreased discrimination to pinprick and light touch in a median nerve distribution. Motor strength is judged to be 5/5 except as noted above. No clonus or tremor.  Motor coordination is within normal limits. Deep tendon reflexes are symmetric.   Nerve conduction studies: EMG/nerve conduction studies performed by Dr. Gurney Maxin on 05/16/2019 were reviewed. There is electrical evidence of median sensorimotor peripheral neuropathy characteristic of carpal tunnel syndrome. Findings are consistent with chronic, moderate degree of involvement bilaterally, left more than right. There is no evidence of focal peripheral neuropathy involving either ulnar nerve.   Impression: Bilateral carpal tunnel syndrome, left more symptomatic than right  Plan:  The findings were discussed in detail with the patient. The patient was given informational material on carpal tunnel release. Conservative treatment options were reviewed with the patient. We discussed the risks and benefits of surgical intervention. The usual perioperative course was also discussed in detail. The patient expressed understanding of the risks and benefits of surgical intervention and would like to proceed with plans for left carpal tunnel release.  MEDICAL CLEARANCE: Per anesthesiology ACTIVITIES: As tolerated. WORK STATUS: Not applicable. THERAPY: None MEDICATIONS: Requested Prescriptions   No prescriptions requested or ordered in this encounter  FOLLOW-UP: Return for preop History & Physical pending surgery date.  Yetta Marceaux P. Holley Bouche., M.D.   Electronically signed by Lamar Benes., MD at 08/16/2019 3:06 PM EDT

## 2019-08-31 ENCOUNTER — Ambulatory Visit: Payer: Medicare Other | Admitting: Anesthesiology

## 2019-08-31 ENCOUNTER — Ambulatory Visit
Admission: RE | Admit: 2019-08-31 | Discharge: 2019-08-31 | Disposition: A | Discharge status: Home / Self Care | Payer: Medicare Other | Attending: Orthopedic Surgery | Admitting: Orthopedic Surgery

## 2019-08-31 ENCOUNTER — Encounter
Admission: RE | Disposition: A | Discharge status: Home / Self Care | Payer: Self-pay | Source: Home / Self Care | Attending: Orthopedic Surgery

## 2019-08-31 ENCOUNTER — Encounter: Payer: Self-pay | Admitting: Orthopedic Surgery

## 2019-08-31 ENCOUNTER — Other Ambulatory Visit: Payer: Self-pay

## 2019-08-31 DIAGNOSIS — Z888 Allergy status to other drugs, medicaments and biological substances status: Secondary | ICD-10-CM | POA: Insufficient documentation

## 2019-08-31 DIAGNOSIS — Z791 Long term (current) use of non-steroidal anti-inflammatories (NSAID): Secondary | ICD-10-CM | POA: Diagnosis not present

## 2019-08-31 DIAGNOSIS — F419 Anxiety disorder, unspecified: Secondary | ICD-10-CM | POA: Diagnosis not present

## 2019-08-31 DIAGNOSIS — I509 Heart failure, unspecified: Secondary | ICD-10-CM | POA: Diagnosis not present

## 2019-08-31 DIAGNOSIS — Z9889 Other specified postprocedural states: Secondary | ICD-10-CM

## 2019-08-31 DIAGNOSIS — I110 Hypertensive heart disease with heart failure: Secondary | ICD-10-CM | POA: Insufficient documentation

## 2019-08-31 DIAGNOSIS — G5603 Carpal tunnel syndrome, bilateral upper limbs: Secondary | ICD-10-CM | POA: Insufficient documentation

## 2019-08-31 DIAGNOSIS — Z87891 Personal history of nicotine dependence: Secondary | ICD-10-CM | POA: Insufficient documentation

## 2019-08-31 DIAGNOSIS — Z6839 Body mass index (BMI) 39.0-39.9, adult: Secondary | ICD-10-CM | POA: Insufficient documentation

## 2019-08-31 DIAGNOSIS — Z79890 Hormone replacement therapy (postmenopausal): Secondary | ICD-10-CM | POA: Diagnosis not present

## 2019-08-31 DIAGNOSIS — E039 Hypothyroidism, unspecified: Secondary | ICD-10-CM | POA: Insufficient documentation

## 2019-08-31 DIAGNOSIS — Z79899 Other long term (current) drug therapy: Secondary | ICD-10-CM | POA: Insufficient documentation

## 2019-08-31 DIAGNOSIS — J449 Chronic obstructive pulmonary disease, unspecified: Secondary | ICD-10-CM | POA: Diagnosis not present

## 2019-08-31 HISTORY — PX: CARPAL TUNNEL RELEASE: SHX101

## 2019-08-31 SURGERY — CARPAL TUNNEL RELEASE
Anesthesia: General | Site: Wrist | Laterality: Left

## 2019-08-31 MED ORDER — FAMOTIDINE 20 MG PO TABS
ORAL_TABLET | ORAL | Status: AC
  Administered 2019-08-31: 20 mg via ORAL
  Filled 2019-08-31: qty 1

## 2019-08-31 MED ORDER — FAMOTIDINE 20 MG PO TABS: 20.0000 mg | ORAL_TABLET | Freq: Once | ORAL | Status: AC

## 2019-08-31 MED ORDER — ONDANSETRON HCL 4 MG/2ML IJ SOLN
INTRAMUSCULAR | Status: AC
  Administered 2019-08-31: 4 mg via INTRAVENOUS
  Filled 2019-08-31: qty 2

## 2019-08-31 MED ORDER — PROPOFOL 10 MG/ML IV BOLUS
INTRAVENOUS | Status: DC | PRN
  Administered 2019-08-31: 50 mg via INTRAVENOUS
  Administered 2019-08-31: 100 mg via INTRAVENOUS

## 2019-08-31 MED ORDER — ACETAMINOPHEN 10 MG/ML IV SOLN
INTRAVENOUS | Status: DC | PRN
  Administered 2019-08-31: 1000 mg via INTRAVENOUS

## 2019-08-31 MED ORDER — ONDANSETRON HCL 4 MG/2ML IJ SOLN
INTRAMUSCULAR | Status: DC | PRN
  Administered 2019-08-31: 4 mg via INTRAVENOUS

## 2019-08-31 MED ORDER — LACTATED RINGERS IV SOLN: INTRAVENOUS | Status: DC

## 2019-08-31 MED ORDER — CHLORHEXIDINE GLUCONATE 0.12 % MT SOLN: 15.0000 mL | Freq: Once | OROMUCOSAL | Status: AC

## 2019-08-31 MED ORDER — FENTANYL CITRATE (PF) 100 MCG/2ML IJ SOLN
INTRAMUSCULAR | Status: AC
  Filled 2019-08-31: qty 2

## 2019-08-31 MED ORDER — NEOMYCIN-POLYMYXIN B GU 40-200000 IR SOLN
Status: DC | PRN
  Administered 2019-08-31: 2 mL

## 2019-08-31 MED ORDER — OXYCODONE HCL 5 MG PO TABS: 5.0000 mg | ORAL_TABLET | Freq: Once | ORAL | Status: DC | PRN

## 2019-08-31 MED ORDER — FENTANYL CITRATE (PF) 100 MCG/2ML IJ SOLN
INTRAMUSCULAR | Status: DC | PRN
Start: 2019-08-31 — End: 2019-08-31
  Administered 2019-08-31: 50 ug via INTRAVENOUS

## 2019-08-31 MED ORDER — FENTANYL CITRATE (PF) 100 MCG/2ML IJ SOLN: 25.0000 ug | INTRAMUSCULAR | Status: DC | PRN

## 2019-08-31 MED ORDER — CELECOXIB 200 MG PO CAPS
ORAL_CAPSULE | ORAL | Status: AC
  Administered 2019-08-31: 400 mg via ORAL
  Filled 2019-08-31: qty 2

## 2019-08-31 MED ORDER — CHLORHEXIDINE GLUCONATE 0.12 % MT SOLN
OROMUCOSAL | Status: AC
  Administered 2019-08-31: 15 mL via OROMUCOSAL
  Filled 2019-08-31: qty 15

## 2019-08-31 MED ORDER — ACETAMINOPHEN 10 MG/ML IV SOLN
INTRAVENOUS | Status: AC
  Filled 2019-08-31: qty 100

## 2019-08-31 MED ORDER — HYDROCODONE-ACETAMINOPHEN 5-325 MG PO TABS: 1.0000 | ORAL_TABLET | ORAL | 0 refills | Status: DC | PRN

## 2019-08-31 MED ORDER — LIDOCAINE HCL (CARDIAC) PF 100 MG/5ML IV SOSY
PREFILLED_SYRINGE | INTRAVENOUS | Status: DC | PRN
  Administered 2019-08-31: 80 mg via INTRAVENOUS

## 2019-08-31 MED ORDER — CELECOXIB 200 MG PO CAPS: 400.0000 mg | ORAL_CAPSULE | Freq: Once | ORAL | Status: AC

## 2019-08-31 MED ORDER — BUPIVACAINE HCL (PF) 0.25 % IJ SOLN
INTRAMUSCULAR | Status: DC | PRN
  Administered 2019-08-31: 10 mL

## 2019-08-31 MED ORDER — ORAL CARE MOUTH RINSE: 15.0000 mL | Freq: Once | OROMUCOSAL | Status: AC

## 2019-08-31 MED ORDER — DEXAMETHASONE SODIUM PHOSPHATE 10 MG/ML IJ SOLN
INTRAMUSCULAR | Status: DC | PRN
  Administered 2019-08-31: 4 mg via INTRAVENOUS

## 2019-08-31 MED ORDER — OXYCODONE HCL 5 MG/5ML PO SOLN: 5.0000 mg | Freq: Once | ORAL | Status: DC | PRN

## 2019-08-31 MED ORDER — PHENYLEPHRINE HCL (PRESSORS) 10 MG/ML IV SOLN
INTRAVENOUS | Status: DC | PRN
  Administered 2019-08-31: 100 ug via INTRAVENOUS

## 2019-08-31 MED ORDER — ACETAMINOPHEN 10 MG/ML IV SOLN: 1000.0000 mg | Freq: Once | INTRAVENOUS | Status: DC | PRN

## 2019-08-31 MED ORDER — ONDANSETRON HCL 4 MG/2ML IJ SOLN: 4.0000 mg | Freq: Once | INTRAMUSCULAR | Status: AC | PRN

## 2019-08-31 SURGICAL SUPPLY — 29 items
BNDG CMPR STD VLCR NS LF 5.8X3 (GAUZE/BANDAGES/DRESSINGS) ×1
BNDG ELASTIC 3X5.8 VLCR NS LF (GAUZE/BANDAGES/DRESSINGS) ×2 IMPLANT
BNDG ESMARK 4X12 TAN STRL LF (GAUZE/BANDAGES/DRESSINGS) ×2 IMPLANT
CANISTER SUCT 1200ML W/VALVE (MISCELLANEOUS) ×2 IMPLANT
CAST PADDING 3X4FT ST 30246 (SOFTGOODS) ×1
COVER WAND RF STERILE (DRAPES) ×2 IMPLANT
CUFF TOURN SGL QUICK 18X4 (TOURNIQUET CUFF) ×2 IMPLANT
DRSG DERMACEA 8X12 NADH (GAUZE/BANDAGES/DRESSINGS) ×2 IMPLANT
DURAPREP 26ML APPLICATOR (WOUND CARE) ×2 IMPLANT
ELECT CAUTERY BLADE 6.4 (BLADE) ×2 IMPLANT
ELECT REM PT RETURN 9FT ADLT (ELECTROSURGICAL) ×2
ELECTRODE REM PT RTRN 9FT ADLT (ELECTROSURGICAL) ×1 IMPLANT
GAUZE SPONGE 4X4 12PLY STRL (GAUZE/BANDAGES/DRESSINGS) ×2 IMPLANT
GLOVE BIOGEL M STRL SZ7.5 (GLOVE) ×2 IMPLANT
GLOVE INDICATOR 8.0 STRL GRN (GLOVE) ×2 IMPLANT
GOWN STRL REUS W/ TWL LRG LVL3 (GOWN DISPOSABLE) ×2 IMPLANT
GOWN STRL REUS W/TWL LRG LVL3 (GOWN DISPOSABLE) ×4
KIT TURNOVER KIT A (KITS) ×2 IMPLANT
NS IRRIG 500ML POUR BTL (IV SOLUTION) ×2 IMPLANT
PACK EXTREMITY (MISCELLANEOUS) ×2 IMPLANT
PAD CAST CTTN 3X4 STRL (SOFTGOODS) ×1 IMPLANT
PADDING CAST COTTON 3X4 STRL (SOFTGOODS) ×1
SOL PREP PVP 2OZ (MISCELLANEOUS) ×2
SOLUTION PREP PVP 2OZ (MISCELLANEOUS) ×1 IMPLANT
SPLINT CAST 1 STEP 3X12 (MISCELLANEOUS) ×2 IMPLANT
STOCKINETTE 48X4 2 PLY STRL (GAUZE/BANDAGES/DRESSINGS) ×1 IMPLANT
STOCKINETTE STRL 4IN 9604848 (GAUZE/BANDAGES/DRESSINGS) ×2 IMPLANT
SUT ETHILON 5-0 FS-2 18 BLK (SUTURE) ×2 IMPLANT
SYR BULB EAR ULCER 3OZ GRN STR (SYRINGE) ×1 IMPLANT

## 2019-08-31 NOTE — Transfer of Care (Signed)
Immediate Anesthesia Transfer of Care Note  Patient: Teresa Jensen  Procedure(s) Performed: CARPAL TUNNEL RELEASE (Left Wrist)  Patient Location: PACU  Anesthesia Type:General  Level of Consciousness: awake, alert  and oriented  Airway & Oxygen Therapy: Patient Spontanous Breathing and Patient connected to face mask oxygen  Post-op Assessment: Report given to RN and Post -op Vital signs reviewed and stable  Post vital signs: Reviewed and stable  Last Vitals:  Vitals Value Taken Time  BP 153/68 08/31/19 1252  Temp 36.8 C 08/31/19 1251  Pulse 82 08/31/19 1254  Resp 16 08/31/19 1254  SpO2 95 % 08/31/19 1254  Vitals shown include unvalidated device data.  Last Pain:  Vitals:   08/31/19 1251  TempSrc:   PainSc: 0-No pain         Complications: No complications documented.

## 2019-08-31 NOTE — Op Note (Signed)
OPERATIVE NOTE  DATE OF SURGERY:  08/31/2019  PATIENT NAME:  Teresa Jensen   DOB: Oct 31, 1945  MRN: [id number]  PRE-OPERATIVE DIAGNOSIS: Left carpal tunnel syndrome  POST-OPERATIVE DIAGNOSIS:  Same  PROCEDURE:  Left carpal tunnel release  SURGEON:  Marciano Sequin. M.D.  ANESTHESIA: general  ESTIMATED BLOOD LOSS: Minimal  FLUIDS REPLACED: 600 mL of crystalloid  TOURNIQUET TIME: 26 minutes  DRAINS: None  INDICATIONS FOR SURGERY: [name] is a 74 y.o. year old female with a long history of numbness and paresthesias to the left hand. EMG/nerve conduction studies demonstrated findings consistent with carpal tunnel syndrome.The patient had not seen any significant improvement despite conservative nonsurgical intervention. After discussion of the risks and benefits of surgical intervention, the patient expressed understanding of the risks benefits and agree with plans for carpal tunnel release.   PROCEDURE IN DETAIL: The patient was brought into the operating room and after adequate general anesthesia, a tourniquet was placed on the patient's left upper arm.The left hand and arm were prepped with alcohol and Duraprep and draped in the usual sterile fashion. A "time-out" was performed as per usual protocol. The hand and forearm were exsanguinated using an Esmarch and the tourniquet was inflated to 250 mmHg. Loupe magnification was used throughout the procedure. An incision was made just ulnar to the thenar palmar crease. Dissection was carried down through the palmar fascia to the transverse carpal ligament. The transverse carpal ligament was sharply incised, taking care to protect the underlying structures with the carpal tunnel. Complete release of the transverse carpal ligament as well as fibrotic tissue was achieved. There was no evidence of ganglion cyst or lipoma within the carpal tunnel. The wound was irrigated with copious amounts of normal saline with antibiotic solution. The  skin was then re-approximated with interrupted sutures of #5-0 nylon. A sterile dressing was applied followed by application of a volar splint. The tourniquet was deflated with a total tourniquet time of 26 minutes.  The patient tolerated the procedure well and was transported to the PACU in stable condition.  Derius Ghosh P. Holley Bouche., M.D.

## 2019-08-31 NOTE — Anesthesia Postprocedure Evaluation (Signed)
Anesthesia Post Note  Patient: Teresa Jensen  Procedure(s) Performed: CARPAL TUNNEL RELEASE (Left Wrist)  Patient location during evaluation: PACU Anesthesia Type: General Level of consciousness: awake and alert Pain management: pain level controlled Vital Signs Assessment: post-procedure vital signs reviewed and stable Respiratory status: spontaneous breathing, nonlabored ventilation, respiratory function stable and patient connected to nasal cannula oxygen Cardiovascular status: blood pressure returned to baseline and stable Postop Assessment: no apparent nausea or vomiting Anesthetic complications: no   No complications documented.   Last Vitals:  Vitals:   08/31/19 1321 08/31/19 1334  BP: (!) 152/65 129/82  Pulse: 71 79  Resp: 13 14  Temp: 36.4 C (!) 36.2 C  SpO2: 96% 96%    Last Pain:  Vitals:   08/31/19 1334  TempSrc:   PainSc: 0-No pain                 Arita Miss

## 2019-08-31 NOTE — Discharge Instructions (Signed)

## 2019-08-31 NOTE — Anesthesia Procedure Notes (Signed)
Procedure Name: LMA Insertion Date/Time: 08/31/2019 11:56 AM Performed by: Nelda Marseille, CRNA Pre-anesthesia Checklist: Patient identified, Emergency Drugs available, Suction available and Patient being monitored Patient Re-evaluated:Patient Re-evaluated prior to induction Oxygen Delivery Method: Circle system utilized Preoxygenation: Pre-oxygenation with 100% oxygen Induction Type: IV induction Ventilation: Mask ventilation without difficulty LMA: LMA inserted LMA Size: 3.0 Number of attempts: 3 Placement Confirmation: positive ETCO2 and breath sounds checked- equal and bilateral Tube secured with: Tape Dental Injury: Teeth and Oropharynx as per pre-operative assessment

## 2019-08-31 NOTE — Anesthesia Preprocedure Evaluation (Signed)
Anesthesia Evaluation  Patient identified by MRN, date of birth, ID band Patient awake    Reviewed: Allergy & Precautions, NPO status , Patient's Chart, lab work & pertinent test results  History of Anesthesia Complications (+) PONV and history of anesthetic complications  Airway Mallampati: I  TM Distance: >3 FB Neck ROM: Full    Dental no notable dental hx. (+) Teeth Intact, Dental Advisory Given   Pulmonary shortness of breath and with exertion, neg sleep apnea, COPD, Patient abstained from smoking.Not current smoker, former smoker,  Has not needed inhalers in 6 months, never hospitalized for COPD, not on home O2   Pulmonary exam normal breath sounds clear to auscultation       Cardiovascular Exercise Tolerance: Good METShypertension, +CHF and + DOE  (-) CAD and (-) Past MI (-) dysrhythmias  Rhythm:Regular Rate:Normal - Systolic murmurs TTE 6213: 1. The left ventricle has normal systolic function with an ejection  fraction of 60-65%. The cavity size was normal. Left ventricular diastolic  Doppler parameters are consistent with impaired relaxation.  2. The right ventricle has normal systolic function. The cavity was  normal. There is no increase in right ventricular wall thickness.  3. Right atrial pressure is estimated at 10 mmHg.  4. Unable to estimate RVSP    Neuro/Psych  Headaches, PSYCHIATRIC DISORDERS    GI/Hepatic GERD  Controlled,(+)     (-) substance abuse  ,   Endo/Other  neg diabetesMorbid obesity  Renal/GU negative Renal ROS     Musculoskeletal   Abdominal (+) + obese,   Peds  Hematology   Anesthesia Other Findings Past Medical History: No date: Anxiety No date: CAP (community acquired pneumonia) No date: COPD (chronic obstructive pulmonary disease) (HCC) No date: Dyspnea     Comment:  DUE TO HEAT WITH COPD 01/09/2017: Epistaxis No date: GERD (gastroesophageal reflux disease)     Comment:   OCC-NO MEDS No date: Hypertension No date: Hypothyroidism No date: New onset of headaches No date: Osteopenia No date: PMR (polymyalgia rheumatica) ([place]) 02/13/2018: Pneumonia No date: PONV (postoperative nausea and vomiting)     Comment:  VERY NAUSEATED AFTER SURGERY No date: Stasis dermatitis 01/20/2011: Strain of chest wall No date: Tobacco abuse  Reproductive/Obstetrics                            Anesthesia Physical Anesthesia Plan  ASA: III  Anesthesia Plan: General   Post-op Pain Management:    Induction: Intravenous  PONV Risk Score and Plan: 4 or greater and Ondansetron, Dexamethasone and Treatment may vary due to age or medical condition  Airway Management Planned: LMA  Additional Equipment: None  Intra-op Plan:   Post-operative Plan: Extubation in OR  Informed Consent: I have reviewed the patients History and Physical, chart, labs and discussed the procedure including the risks, benefits and alternatives for the proposed anesthesia with the patient or authorized representative who has indicated his/her understanding and acceptance.     Dental advisory given  Plan Discussed with: CRNA and Surgeon  Anesthesia Plan Comments: (Discussed risks of anesthesia with patient, including PONV, sore throat, lip/dental damage. Rare risks discussed as well, such as cardiorespiratory and neurological sequelae. Patient understands.)        Anesthesia Quick Evaluation

## 2019-08-31 NOTE — H&P (Signed)
The patient has been re-examined, and the chart reviewed, and there have been no interval changes to the documented history and physical.    The risks, benefits, and alternatives have been discussed at length. The patient expressed understanding of the risks benefits and agreed with plans for surgical intervention.  Devon Pretty P. Caree Wolpert, Jr. M.D.    

## 2019-09-09 ENCOUNTER — Other Ambulatory Visit: Payer: Self-pay

## 2019-09-09 ENCOUNTER — Ambulatory Visit (INDEPENDENT_AMBULATORY_CARE_PROVIDER_SITE_OTHER): Payer: Medicare Other | Admitting: Dermatology

## 2019-09-09 ENCOUNTER — Ambulatory Visit: Payer: Medicare Other | Admitting: Dermatology

## 2019-09-09 DIAGNOSIS — I8312 Varicose veins of left lower extremity with inflammation: Secondary | ICD-10-CM | POA: Diagnosis not present

## 2019-09-09 DIAGNOSIS — I8311 Varicose veins of right lower extremity with inflammation: Secondary | ICD-10-CM | POA: Diagnosis not present

## 2019-09-09 MED ORDER — PENTOXIFYLLINE ER 400 MG PO TBCR: 400.0000 mg | EXTENDED_RELEASE_TABLET | Freq: Three times a day (TID) | ORAL | 5 refills | Status: DC

## 2019-09-09 NOTE — Progress Notes (Signed)
   Follow-Up Visit   Subjective  Teresa Jensen is a 74 y.o. female who presents for the following: Follow-up.  Patient here to follow up on lipodermatosclerosis of the lower legs. She is using tacrolimus twice daily Monday thru Friday and clobetasol on the weekends. She normally wears compression stockings daily. Patient does complain of some persistent pain in the legs.   The following portions of the chart were reviewed this encounter and updated as appropriate:  Tobacco  Allergies  Meds  Problems  Med Hx  Surg Hx  Fam Hx      Review of Systems:  No other skin or systemic complaints except as noted in HPI or Assessment and Plan.  Objective  Well appearing patient in no apparent distress; mood and affect are within normal limits.  A focused examination was performed including lower legs. Relevant physical exam findings are noted in the Assessment and Plan.  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 Area previously treated with [place] doing well today.   Continue clobetasol ointment - Increase to twice a day for 2 weeks then weekends only and restart tacrolimus twice daily during the week. Restart pentoxifylline 400mg  three times daily. She tolerated this well previously. Cont compression stockings daily.   Topical steroids (such as triamcinolone, fluocinolone, fluocinonide, mometasone, clobetasol, halobetasol, betamethasone, hydrocortisone) can cause thinning and lightening of the skin if they are used for too long in the same area. Your physician has selected the right strength medicine for your problem and area affected on the body. Please use your medication only as directed by your physician to prevent side effects.     pentoxifylline (TRENTAL) 400 MG CR tablet - Lower Legs  Other Related Medications clobetasol  ointment (TEMOVATE) 0.05 % tacrolimus (PROTOPIC) 0.1 % ointment  Return 5-6 weeks.  Teresa Jensen, RMA, am acting as scribe for Forest Gleason, MD .  Documentation: I have reviewed the above documentation for accuracy and completeness, and I agree with the above.  Forest Gleason, MD

## 2019-09-09 NOTE — Patient Instructions (Addendum)
Continue clobetasol ointment twice a day for 2 weeks then clobetasol on weekends only and restart tacrolimus twice daily during the week. Restart pentoxifylline 400mg  three times daily. Continue compression socks daily.   Topical steroids (such as triamcinolone, fluocinolone, fluocinonide, mometasone, clobetasol, halobetasol, betamethasone, hydrocortisone) can cause thinning and lightening of the skin if they are used for too long in the same area. Your physician has selected the right strength medicine for your problem and area affected on the body. Please use your medication only as directed by your physician to prevent side effects.

## 2019-09-12 ENCOUNTER — Other Ambulatory Visit: Payer: Self-pay | Admitting: Primary Care

## 2019-09-12 DIAGNOSIS — E038 Other specified hypothyroidism: Secondary | ICD-10-CM

## 2019-09-28 ENCOUNTER — Encounter: Payer: Self-pay | Admitting: Dermatology

## 2019-09-30 ENCOUNTER — Ambulatory Visit: Payer: Medicare Other | Admitting: Internal Medicine

## 2019-10-08 ENCOUNTER — Other Ambulatory Visit: Payer: Self-pay

## 2019-10-08 ENCOUNTER — Ambulatory Visit (INDEPENDENT_AMBULATORY_CARE_PROVIDER_SITE_OTHER): Payer: Medicare Other | Admitting: Family

## 2019-10-08 ENCOUNTER — Encounter: Payer: Self-pay | Admitting: Family

## 2019-10-08 VITALS — BP 146/70 | HR 100 | Ht <= 58 in | Wt 184.0 lb

## 2019-10-08 DIAGNOSIS — I5032 Chronic diastolic (congestive) heart failure: Secondary | ICD-10-CM | POA: Diagnosis not present

## 2019-10-08 DIAGNOSIS — J449 Chronic obstructive pulmonary disease, unspecified: Secondary | ICD-10-CM

## 2019-10-08 DIAGNOSIS — I10 Essential (primary) hypertension: Secondary | ICD-10-CM | POA: Diagnosis not present

## 2019-10-08 NOTE — Patient Instructions (Addendum)
Medication Instructions:  No medication changes today.  *If you need a refill on your cardiac medications before your next appointment, please call your pharmacy*  Lab Work: No lab work today.   Testing/Procedures: None ordered today.   Follow-Up: At [facility], you and your health needs are our priority.  As part of our continuing mission to provide you with exceptional heart care, we have created designated Provider Care Teams.  These Care Teams include your primary Cardiologist (physician) and Advanced Practice Providers (APPs -  Physician Assistants and Nurse Practitioners) who all work together to provide you with the care you need, when you need it.  We recommend signing up for the patient portal called "MyChart".  Sign up information is provided on this After Visit Summary.  MyChart is used to connect with patients for Virtual Visits (Telemedicine).  Patients are able to view lab/test results, encounter notes, upcoming appointments, etc.  Non-urgent messages can be sent to your provider as well.   To learn more about what you can do with MyChart, go to [email or web address].    Your next appointment:   4 month(s)  The format for your next appointment:   In Person  Provider:   You may see Nelva Bush, MD or one of the following Advanced Practice Providers on your designated Care Team:   Murray Hodgkins, NP  Christell Faith, PA-C  Marrianne Mood, PA-C  Laurann Montana, NP  Cadence Kathlen Mody, [place]  Other Instructions  Please check your blood pressure once per day at least 2 hours after your medications. Please send [place] a MyChart message in one week with the report of your blood pressure readings.   Blood Pressure Log   Date   Time  Blood Pressure  Position  Example: Nov 1 9 AM 124/78 sitting

## 2019-10-08 NOTE — Progress Notes (Signed)
Office Visit    Patient Name: Teresa Jensen Date of Encounter: 10/08/2019  Primary Care Provider:  Pleas Koch, NP Primary Cardiologist:  Nelva Bush, MD Electrophysiologist:  None   Chief Complaint    Teresa Jensen is a 74 y.o. female with a hx of HFpEF, HTN, COPD, polymyalgia rheumatica, GERD presents today for follow up of HFpEF, HTN.  Past Medical History    Past Medical History:  Diagnosis Date  . Anxiety   . CAP (community acquired pneumonia)   . COPD (chronic obstructive pulmonary disease) ([place])   . Dyspnea    DUE TO HEAT WITH COPD  . Epistaxis 01/09/2017  . GERD (gastroesophageal reflux disease)    OCC-NO MEDS  . Hypertension   . Hypothyroidism   . New onset of headaches   . Osteopenia   . PMR (polymyalgia rheumatica) (HCC)   . Pneumonia 02/13/2018  . PONV (postoperative nausea and vomiting)    VERY NAUSEATED AFTER SURGERY  . Stasis dermatitis   . Strain of chest wall 01/20/2011  . Tobacco abuse    Past Surgical History:  Procedure Laterality Date  . ABDOMINAL HYSTERECTOMY    . APPENDECTOMY    . BACK SURGERY    . CARPAL TUNNEL RELEASE Left   . CARPAL TUNNEL RELEASE Left 08/31/2019   Procedure: CARPAL TUNNEL RELEASE;  Surgeon: Dereck Leep, MD;  Location: [facility] ORS;  Service: Orthopedics;  Laterality: Left;  . CERVICAL FUSION    . CHOLECYSTECTOMY    . HEMORRHOID SURGERY    . LUMBAR FUSION    . TUBAL LIGATION      Allergies  Allergies  Allergen Reactions  . Prednisone Anaphylaxis, Hives, Shortness Of Breath, Itching and Swelling    The face swells HAS TAKEN CORTISONE SHOTS WITHOUT ISSUE BEFORE  . Bisphosphonates Other (See Comments)    Unsure  . Epinephrine   . Milk-Related Compounds Diarrhea  . Prevnar 13 [Pneumococcal 13-Val Conj Vacc] Other (See Comments)    Reaction not recalled by the patient  . Food Rash    MANGO = cause rashes around the mouth  . Mangifera Indica Rash    History of Present Illness    Teresa Jensen is a 74 y.o. female with a hx of HFpEF, COPD, HTN, polymyalgia rheumatica, GERD last seen 06/29/19.  Initially seen 11/2018 by Dr. Saunders Revel following hospitalization for shortness of breath and weakness.  Chest x-ray suggestive of pulmonary edema.  Echocardiogram showed preserved LVEF with grade 1 diastolic dysfunction.  She did subsequent myocardial perfusion stress test which was low risk without evidence of ischemia.  At last clinic visit 05/22/2019 she reported stable exertional dyspnea.  Her losartan was increased to 100 mg daily due to suboptimal control of her blood pressure. Seen in clinic 06/29/19 she was euvolemic and her BP was much improved. Her medications were continued.   Since last seen she has had carpal tunnel release.   Presents today for follow up. Reports no shortness of breath. Her dyspnea on exertion has been stable. Reports no chest pain, pressure, or tightness. No edema, orthopnea, PND. Reports no palpitations. Tells me her blood pressure at home has been "fine". Tells me her "face stays red" does not itch.    Enjoys spending time with her grandkids and helps take care of them. Her sister is getting ready to be placed in a nursing facility. This has been provided some stress.    EKGs/Labs/Other Studies Reviewed:   The following  studies were reviewed today:  EKG:  No EKG today.  Recent Labs: 10/24/2018: ALT 25; TSH 1.72 06/29/2019: BUN 17; Creatinine, Ser 0.76; Potassium 4.4; Sodium 138 08/27/2019: Hemoglobin 13.4; Platelets 304  Recent Lipid Panel    Component Value Date/Time   CHOL 167 10/24/2018 0822   TRIG 143.0 10/24/2018 0822   HDL 43.90 10/24/2018 0822   CHOLHDL 4 10/24/2018 0822   VLDL 28.6 10/24/2018 0822   LDLCALC 95 10/24/2018 0822   Home Medications   Current Meds  Medication Sig  . albuterol (PROVENTIL HFA;VENTOLIN HFA) 108 (90 Base) MCG/ACT inhaler INHALE 2 PUFFS INTO THE LUNGS EVERY 4 (FOUR) HOURS AS NEEDED FOR SHORTNESS OF BREATH.  Marland Kitchen ALPRAZolam  (XANAX) 0.25 MG tablet Take 1 tablet by mouth daily as needed for anxiety.  . clobetasol ointment (TEMOVATE) 1.02 % Apply 1 application topically 2 (two) times daily. (Patient taking differently: Apply 1 application topically 2 (two) times a week. ON Saturday AND SUNDAYS TO LEGS)  . levothyroxine (SYNTHROID) 75 MCG tablet TAKE 1 TABLET BY MOUTH EVERY MORNING ON EMPTY STOMACH WITH A GLASS OF WATER ONLY.  Marland Kitchen losartan (COZAAR) 100 MG tablet Take 1 tablet (100 mg total) by mouth daily. (Patient taking differently: Take 100 mg by mouth every morning. )  . Multiple Vitamins-Minerals (PRESERVISION AREDS 2 PO) Take 1 tablet by mouth in the morning and at bedtime.  . pentoxifylline (TRENTAL) 400 MG CR tablet Take 1 tablet (400 mg total) by mouth 3 (three) times daily with meals.  . tacrolimus (PROTOPIC) 0.1 % ointment Apply  as directed to affected area twice a day (Patient taking differently: Apply 1 application topically 2 (two) times daily. Apply  as directed to affected area twice a day)   Review of Systems   Review of Systems  Constitutional: Negative for chills, fever and malaise/fatigue.  Cardiovascular: Positive for dyspnea on exertion. Negative for chest pain, leg swelling, near-syncope, orthopnea, palpitations and syncope.  Respiratory: Negative for cough, shortness of breath and wheezing.   Gastrointestinal: Negative for nausea and vomiting.  Neurological: Negative for dizziness, light-headedness and weakness.   All other systems reviewed and are otherwise negative except as noted above.  Physical Exam    VS:  BP (!) 160/68 (BP Location: Left Arm, Patient Position: Sitting, Cuff Size: Normal)   Pulse 100   Ht 4\' 9"  (1.448 m)   Wt 184 lb (83.5 kg)   SpO2 90%   BMI 39.82 kg/m  , BMI Body mass index is 39.82 kg/m. GEN: Well nourished, overweight, well developed, in no acute distress. HEENT: normal. Neck: Supple, no JVD, carotid bruits, or masses. Cardiac: RRR, no murmurs, rubs, or  gallops. No clubbing, cyanosis. Trace pedal edema bilaterally.  Radials/DP/PT 2+ and equal bilaterally.  Respiratory:  Respirations regular and unlabored, clear to auscultation bilaterally. GI: Soft, nontender, nondistended, BS + x 4. MS: No deformity or atrophy. Skin: Warm and dry, no rash. Neuro:  Strength and sensation are intact. Psych: Normal affect.  Assessment & Plan    1. HFpEF - Euvolemic and well compensated on exam. DOE is stable at her baseline. GDMT includes ARB. No indication for loop diuretic at this time. Continues to wear her compression stockings.  Continue low-sodium, healthy diet.  Continue regular cardiovascular exercise.   2. HTN -BP elevated in clinic today.  She is not monitoring routinely at home.  Likely exacerbated by recent stress regarding family situations.  She will continue losartan 100 mg daily.  We discussed adding an additional  agent such as  HCTZ or Amlodipine, but she is very hesitant.  She was agreeable to monitor her BP daily for 1 week and report to [place].  If BP is elevated consistently  >130/80 at home we will plan to add additional antihypertensive agent.   3. COPD - Likely etiology of most of her dyspnea.  No signs of acute exacerbation.  Continue to follow with pulmonology and PCP.  Disposition: Report home BP readings in 1 week for consideration of additional antihypertensive agent.  Follow up in 4 month(s) with Dr. Saunders Revel or APP   Loel Dubonnet, NP 10/08/2019, 9:45 AM

## 2019-10-19 ENCOUNTER — Encounter: Payer: Self-pay | Admitting: Family

## 2019-10-20 ENCOUNTER — Ambulatory Visit (INDEPENDENT_AMBULATORY_CARE_PROVIDER_SITE_OTHER): Payer: Medicare Other | Admitting: Dermatology

## 2019-10-20 ENCOUNTER — Other Ambulatory Visit: Payer: Self-pay

## 2019-10-20 DIAGNOSIS — I8312 Varicose veins of left lower extremity with inflammation: Secondary | ICD-10-CM

## 2019-10-20 DIAGNOSIS — I8311 Varicose veins of right lower extremity with inflammation: Secondary | ICD-10-CM

## 2019-10-20 NOTE — Progress Notes (Signed)
   Follow-Up Visit   Subjective  Teresa Jensen is a 74 y.o. female who presents for the following: lipodermatosclerosis (Patient using clobetasol ointment twice daily on weekends, tacrolimus twice daily and taking pentoxifylline. ).  Patient is having some diarrhea and is wondering if it could be a side effect of the pentoxifylline.   The following portions of the chart were reviewed this encounter and updated as appropriate:  Tobacco  Allergies  Meds  Problems  Med Hx  Surg Hx  Fam Hx      Review of Systems:  No other skin or systemic complaints except as noted in HPI or Assessment and Plan.  Objective  Well appearing patient in no apparent distress; mood and affect are within normal limits.  A focused examination was performed including legs. Relevant physical exam findings are noted in the Assessment and Plan.  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  Graciella Belton, RMA, am acting as scribe for Forest Gleason, MD .  Documentation: I have reviewed the above documentation for accuracy and completeness, and I agree with the above.  Forest Gleason, MD

## 2019-10-25 MED ORDER — AMLODIPINE BESYLATE 5 MG PO TABS: 5.0000 mg | ORAL_TABLET | Freq: Every day | ORAL | 2 refills | Status: DC

## 2019-10-27 ENCOUNTER — Encounter: Payer: Medicare Other | Admitting: Primary Care

## 2019-10-27 ENCOUNTER — Other Ambulatory Visit: Payer: Self-pay

## 2019-10-27 ENCOUNTER — Encounter: Payer: Self-pay | Admitting: Primary Care

## 2019-10-27 ENCOUNTER — Ambulatory Visit (INDEPENDENT_AMBULATORY_CARE_PROVIDER_SITE_OTHER): Payer: Medicare Other | Admitting: Primary Care

## 2019-10-27 ENCOUNTER — Encounter: Payer: Self-pay | Admitting: Dermatology

## 2019-10-27 VITALS — BP 146/76 | HR 74 | Temp 96.9°F | Ht <= 58 in | Wt 183.0 lb

## 2019-10-27 DIAGNOSIS — M810 Age-related osteoporosis without current pathological fracture: Secondary | ICD-10-CM

## 2019-10-27 DIAGNOSIS — G5603 Carpal tunnel syndrome, bilateral upper limbs: Secondary | ICD-10-CM

## 2019-10-27 DIAGNOSIS — E559 Vitamin D deficiency, unspecified: Secondary | ICD-10-CM | POA: Diagnosis not present

## 2019-10-27 DIAGNOSIS — R7303 Prediabetes: Secondary | ICD-10-CM | POA: Diagnosis not present

## 2019-10-27 DIAGNOSIS — E038 Other specified hypothyroidism: Secondary | ICD-10-CM | POA: Diagnosis not present

## 2019-10-27 DIAGNOSIS — I10 Essential (primary) hypertension: Secondary | ICD-10-CM

## 2019-10-27 DIAGNOSIS — R7989 Other specified abnormal findings of blood chemistry: Secondary | ICD-10-CM

## 2019-10-27 DIAGNOSIS — I5032 Chronic diastolic (congestive) heart failure: Secondary | ICD-10-CM

## 2019-10-27 DIAGNOSIS — K219 Gastro-esophageal reflux disease without esophagitis: Secondary | ICD-10-CM | POA: Diagnosis not present

## 2019-10-27 DIAGNOSIS — Z23 Encounter for immunization: Secondary | ICD-10-CM

## 2019-10-27 DIAGNOSIS — D649 Anemia, unspecified: Secondary | ICD-10-CM

## 2019-10-27 DIAGNOSIS — J449 Chronic obstructive pulmonary disease, unspecified: Secondary | ICD-10-CM

## 2019-10-27 DIAGNOSIS — Z0000 Encounter for general adult medical examination without abnormal findings: Secondary | ICD-10-CM

## 2019-10-27 DIAGNOSIS — E538 Deficiency of other specified B group vitamins: Secondary | ICD-10-CM

## 2019-10-27 DIAGNOSIS — F418 Other specified anxiety disorders: Secondary | ICD-10-CM

## 2019-10-27 DIAGNOSIS — E2839 Other primary ovarian failure: Secondary | ICD-10-CM

## 2019-10-27 LAB — COMPREHENSIVE METABOLIC PANEL
ALT: 12 U/L (ref 0–35)
AST: 18 U/L (ref 0–37)
Albumin: 3.8 g/dL (ref 3.5–5.2)
Alkaline Phosphatase: 74 U/L (ref 39–117)
BUN: 8 mg/dL (ref 6–23)
CO2: 30 mEq/L (ref 19–32)
Calcium: 8.7 mg/dL (ref 8.4–10.5)
Chloride: 100 mEq/L (ref 96–112)
Creatinine, Ser: 0.62 mg/dL (ref 0.40–1.20)
GFR: 87.61 mL/min (ref >60.00)
Glucose, Bld: 78 mg/dL (ref 70–99)
Potassium: 3.8 mEq/L (ref 3.5–5.1)
Sodium: 139 mEq/L (ref 135–145)
Total Bilirubin: 0.5 mg/dL (ref 0.2–1.2)
Total Protein: 7 g/dL (ref 6.0–8.3)

## 2019-10-27 LAB — LIPID PANEL
Cholesterol: 142 mg/dL (ref 0–200)
HDL: 40.7 mg/dL (ref >39.00)
LDL Cholesterol: 82 mg/dL (ref 0–99)
NonHDL: 101.53
Total CHOL/HDL Ratio: 3
Triglycerides: 99 mg/dL (ref 0.0–149.0)
VLDL: 19.8 mg/dL (ref 0.0–40.0)

## 2019-10-27 LAB — HEMOGLOBIN A1C: Hgb A1c MFr Bld: 6.4 % (ref 4.6–6.5)

## 2019-10-27 LAB — CBC
HCT: 41.1 % (ref 36.0–46.0)
Hemoglobin: 13.4 g/dL (ref 12.0–15.0)
MCHC: 32.6 g/dL (ref 30.0–36.0)
MCV: 90.6 fl (ref 78.0–100.0)
Platelets: 366 10*3/uL (ref 150.0–400.0)
RBC: 4.53 Mil/uL (ref 3.87–5.11)
RDW: 14.5 % (ref 11.5–15.5)
WBC: 10.7 10*3/uL — ABNORMAL HIGH (ref 4.0–10.5)

## 2019-10-27 LAB — VITAMIN B12: Vitamin B-12: 227 pg/mL (ref 211–911)

## 2019-10-27 LAB — TSH: TSH: 1.88 u[IU]/mL (ref 0.35–4.50)

## 2019-10-27 LAB — VITAMIN D 25 HYDROXY (VIT D DEFICIENCY, FRACTURES): VITD: 14.03 ng/mL — ABNORMAL LOW (ref 30.00–100.00)

## 2019-10-27 MED ORDER — FAMOTIDINE 20 MG PO TABS: 20.0000 mg | ORAL_TABLET | Freq: Every day | ORAL | 0 refills | Status: DC

## 2019-10-27 NOTE — Assessment & Plan Note (Signed)
Influenza vaccination provided today.  Other vaccines up-to-date.  Medicare will not cover Shingrix or tetanus vaccines.  Colonoscopy up-to-date.  Mammogram up-to-date, bone density scan due and ordered.  Encouraged healthy diet and regular exercise.  Advanced directive packet is provided today.  All recommendations provided at end of visit.  I have personally reviewed and have noted: 1. The patient's medical and social history 2. Their use of alcohol, tobacco or illicit drugs 3. Their current medications and supplements 4. The patient's functional ability including ADL's, fall risks, home  safety risks and hearing or visual impairment. 5. Diet and physical activities 6. Evidence for depression or mood disorder

## 2019-10-27 NOTE — Assessment & Plan Note (Signed)
Bone density scan due, ordered and pending. Compliant to calcium and vitamin D.  Encouraged to increase weightbearing exercise.

## 2019-10-27 NOTE — Assessment & Plan Note (Signed)
Overall stable. She has alprazolam remaining from her refill in April 2021.  Discussed that this should last her an entire year.

## 2019-10-27 NOTE — Assessment & Plan Note (Signed)
Compliant to levothyroxine 75 mcg, is taking correctly.  Repeat TSH pending.

## 2019-10-27 NOTE — Assessment & Plan Note (Signed)
Above goal in the office today, also during recent cardiology visit.  Recommended she proceed with recommendations for amlodipine 5 mg along with losartan 100 mg.  She will monitor blood pressure and update.

## 2019-10-27 NOTE — Progress Notes (Signed)
Patient ID: Teresa Jensen, female   DOB: [birth year], 74 y.o.   MRN: [id number]  Teresa Jensen is a 74 year old female who presents today for [facility].  HPI:  Past Medical History:  Diagnosis Date  . Anxiety   . CAP (community acquired pneumonia)   . COPD (chronic obstructive pulmonary disease) ([place])   . Dyspnea    DUE TO HEAT WITH COPD  . Epistaxis 01/09/2017  . GERD (gastroesophageal reflux disease)    OCC-NO MEDS  . Hypertension   . Hypothyroidism   . New onset of headaches   . Osteopenia   . PMR (polymyalgia rheumatica) ([facility])   . Pneumonia 02/13/2018  . PONV (postoperative nausea and vomiting)    VERY NAUSEATED AFTER SURGERY  . Stasis dermatitis   . Strain of chest wall 01/20/2011  . Tobacco abuse     Current Outpatient Medications  Medication Sig Dispense Refill  . albuterol (PROVENTIL HFA;VENTOLIN HFA) 108 (90 Base) MCG/ACT inhaler INHALE 2 PUFFS INTO THE LUNGS EVERY 4 (FOUR) HOURS AS NEEDED FOR SHORTNESS OF BREATH. 6.7 Inhaler 0  . ALPRAZolam (XANAX) 0.25 MG tablet Take 1 tablet by mouth daily as needed for anxiety. 20 tablet 0  . amLODipine (NORVASC) 5 MG tablet Take 1 tablet (5 mg total) by mouth daily. 30 tablet 2  . clobetasol ointment (TEMOVATE) 8.29 % Apply 1 application topically 2 (two) times daily. (Patient taking differently: Apply 1 application topically 2 (two) times a week. ON Saturday AND SUNDAYS TO LEGS) 30 g 1  . levothyroxine (SYNTHROID) 75 MCG tablet TAKE 1 TABLET BY MOUTH EVERY MORNING ON EMPTY STOMACH WITH A GLASS OF WATER ONLY. 90 tablet 0  . losartan (COZAAR) 100 MG tablet Take 1 tablet (100 mg total) by mouth daily. (Patient taking differently: Take 100 mg by mouth every morning. ) 90 tablet 1  . Multiple Vitamins-Minerals (PRESERVISION AREDS 2 PO) Take 1 tablet by mouth in the morning and at bedtime.    . pentoxifylline (TRENTAL) 400 MG CR tablet Take 1 tablet (400 mg total) by mouth 3 (three) times daily with meals. (Patient taking differently: Take 400 mg by  mouth in the morning and at bedtime. ) 90 tablet 5  . tacrolimus (PROTOPIC) 0.1 % ointment Apply  as directed to affected area twice a day (Patient taking differently: Apply 1 application topically 2 (two) times daily. Apply  as directed to affected area twice a day) 100 g 1   No current facility-administered medications for this visit.    Allergies  Allergen Reactions  . Prednisone Anaphylaxis, Hives, Shortness Of Breath, Itching and Swelling    The face swells HAS TAKEN CORTISONE SHOTS WITHOUT ISSUE BEFORE  . Bisphosphonates Other (See Comments)    Unsure  . Epinephrine   . Milk-Related Compounds Diarrhea  . Prevnar 13 [Pneumococcal 13-Val Conj Vacc] Other (See Comments)    Reaction not recalled by the patient  . Food Rash    MANGO = cause rashes around the mouth  . Mangifera Indica Rash    Family History  Problem Relation Age of Onset  . Heart attack Mother 68  . Cancer Father        lung/chest wall   . Prostate cancer Father   . Diabetes Brother        siblings  . Hypertension Sister        x 2  . Heart disease Brother   . Breast cancer Paternal Grandmother   . Heart disease Sister   .  Diabetes Sister   . Tuberculosis Sister   . Cirrhosis Brother     Social History   Socioeconomic History  . Marital status: Married    Spouse name: Not on file  . Number of children: 1  . Years of education: Not on file  . Highest education level: Not on file  Occupational History  . Occupation: Therapist, art as a Therapist, art: RETIRED    Comment: retired  Tobacco Use  . Smoking status: Former Smoker    Packs/day: 1.50    Years: 20.00    Pack years: 30.00    Types: Cigarettes    Quit date: 02/11/2009    Years since quitting: 10.7  . Smokeless tobacco: Never Used  Vaping Use  . Vaping Use: Never used  Substance and Sexual Activity  . Alcohol use: No    Alcohol/week: 0.0 standard drinks  . Drug use: No  . Sexual activity: Never  Other Topics Concern   . Not on file  Social History Narrative   Lives in [place] with her husband.  Takes care of her two grandchildren, ages 29 and 2, everyday in [place].  Has one son who is 67.   Desires CPR   Would not want prolonged life support if futile   Social Determinants of Health   Financial Resource Strain:   . Difficulty of Paying Living Expenses: Not on file  Food Insecurity:   . Worried About Charity fundraiser in the Last Year: Not on file  . Ran Out of Food in the Last Year: Not on file  Transportation Needs:   . Lack of Transportation (Medical): Not on file  . Lack of Transportation (Non-Medical): Not on file  Physical Activity:   . Days of Exercise per Week: Not on file  . Minutes of Exercise per Session: Not on file  Stress:   . Feeling of Stress : Not on file  Social Connections:   . Frequency of Communication with Friends and Family: Not on file  . Frequency of Social Gatherings with Friends and Family: Not on file  . Attends Religious Services: Not on file  . Active Member of Clubs or Organizations: Not on file  . Attends Archivist Meetings: Not on file  . Marital Status: Not on file  Intimate Partner Violence:   . Fear of Current or Ex-Partner: Not on file  . Emotionally Abused: Not on file  . Physically Abused: Not on file  . Sexually Abused: Not on file    Hospitiliaztions: Carpal Tunnel Release in August 2021  Health Maintenance:    Flu: Completed today  Tetanus: Completed in 2010  Pneumovax: Completed in 2021  Prevnar: Completed in 2016  Zostavax/Shingrix: Insurance will not cover.   Bone Density: Completed in 2019  Colonoscopy: Completed in 2013  Eye Doctor: Completes annually   Dental Exam: Completes annually   Mammogram: Completed in 2021  Hep C Screening: Negative    Providers: Alma Friendly, PCP; Dr. Laurence Ferrari, Dermatology; Laurann Montana, Cardiology; Dr. Marry Guan, Othopedics   I have personally reviewed and have noted: 1. The patient's  medical and social history 2. Their use of alcohol, tobacco or illicit drugs 3. Their current medications and supplements 4. The patient's functional ability including ADL's, fall risks, home  safety risks and hearing or visual impairment. 5. Diet and physical activities 6. Evidence for depression or mood disorder  Subjective:   Review of Systems:   Constitutional: Denies fever, malaise, fatigue,  headache or abrupt weight changes.  HEENT: Denies eye pain, eye redness, ear pain, ringing in the ears, wax buildup, runny nose, nasal congestion, bloody nose, or sore throat. Respiratory: Mild shortness of breath, No cough or sputum production.   Cardiovascular: Denies chest pain, chest tightness, palpitations or swelling in the hands or feet.  Gastrointestinal: Denies abdominal pain, bloating, constipation, diarrhea or blood in the stool.  GU: Denies urgency, frequency, pain with urination, burning sensation, blood in urine, odor or discharge. Musculoskeletal: Denies decrease in range of motion, difficulty with gait, muscle pain or joint pain and swelling.  Skin: Denies redness, rashes, lesions or ulcercations. Follows with dermatology.  Neurological: Denies dizziness, difficulty with speech or problems with balance and coordination.  Psychiatric: She's under a lot of personal stress.    No other specific complaints in a complete review of systems (except as listed in HPI above).  Objective:  PE:   BP (!) 146/76   Pulse 74   Temp (!) 96.9 F (36.1 C) (Temporal)   Ht 4\' 9"  (1.448 m)   Wt 183 lb (83 kg)   SpO2 93%   BMI 39.60 kg/m  Wt Readings from Last 3 Encounters:  10/27/19 183 lb (83 kg)  10/08/19 184 lb (83.5 kg)  08/31/19 185 lb (83.9 kg)    General: Appears their stated age, well developed, well nourished in NAD. Skin: Warm, dry and intact. No rashes, lesions or ulcerations noted. HEENT: Head: normal shape and size; Eyes: sclera white, no icterus, conjunctiva pink, PERRLA  and EOMs intact; Ears: Tm's gray and intact, normal light reflex; Nose: mucosa pink and moist, septum midline; Throat/Mouth: Teeth present, mucosa pink and moist, no exudate, lesions or ulcerations noted.  Neck: Normal range of motion. Neck supple, trachea midline. No massses, lumps or thyromegaly present.  Cardiovascular: Normal rate and rhythm. S1,S2 noted.  No murmur, rubs or gallops noted. No JVD or BLE edema. No carotid bruits noted. Pulmonary/Chest: Normal effort and positive vesicular breath sounds. No respiratory distress. No wheezes, rales or ronchi noted.  Abdomen: Soft and nontender. Normal bowel sounds, no bruits noted. No distention or masses noted. Liver, spleen and kidneys non palpable. Musculoskeletal: Normal range of motion. No signs of joint swelling. No difficulty with gait.  Neurological: Alert and oriented. Cranial nerves II-XII intact. Coordination normal. +DTRs bilaterally. Psychiatric: Mood and affect normal. Behavior is normal. Judgment and thought content normal.    BMET    Component Value Date/Time   NA 138 06/29/2019 0909   K 4.4 06/29/2019 0909   CL 98 06/29/2019 0909   CO2 24 06/29/2019 0909   GLUCOSE 155 (H) 06/29/2019 0909   GLUCOSE 120 (H) 11/26/2018 1048   BUN 17 06/29/2019 0909   CREATININE 0.76 06/29/2019 0909   CALCIUM 9.3 06/29/2019 0909   GFRNONAA 78 06/29/2019 0909   GFRAA 89 06/29/2019 0909    Lipid Panel     Component Value Date/Time   CHOL 167 10/24/2018 0822   TRIG 143.0 10/24/2018 0822   HDL 43.90 10/24/2018 0822   CHOLHDL 4 10/24/2018 0822   VLDL 28.6 10/24/2018 0822   LDLCALC 95 10/24/2018 0822    CBC    Component Value Date/Time   WBC 9.6 08/27/2019 0929   RBC 4.47 08/27/2019 0929   HGB 13.4 08/27/2019 0929   HCT 42.8 08/27/2019 0929   PLT 304 08/27/2019 0929   MCV 95.7 08/27/2019 0929   MCH 30.0 08/27/2019 0929   MCHC 31.3 08/27/2019 0929   RDW  14.4 08/27/2019 0929   LYMPHSABS 3.1 10/24/2018 0822   MONOABS 0.8  10/24/2018 0822   EOSABS 0.1 10/24/2018 0822   BASOSABS 0.0 10/24/2018 0822    Hgb A1C Lab Results  Component Value Date   HGBA1C 6.1 10/24/2018      Assessment and Plan:   Medicare Annual Wellness Visit:  Diet: She endorses a fair diet, someday's doesn't feel like eating. Physical activity: Sedentary, some activity at home.  Depression/mood screen: Negative Hearing: Intact to whispered voice Visual acuity: Grossly normal, performs annual eye exam  ADLs: Capable Fall risk: None Home safety: Good Cognitive evaluation: Intact to orientation, naming, recall and repetition EOL planning: Adv directives needs to be completed.   Preventative Medicine: Influenza vaccination provided today.  Other vaccines up-to-date.  Medicare will not cover Shingrix or tetanus vaccines.  Colonoscopy up-to-date.  Mammogram up-to-date, bone density scan due and ordered.  Encouraged healthy diet and regular exercise.  Advanced directive packet is provided today.  All recommendations provided at end of visit.  Next appointment: 1 year

## 2019-10-27 NOTE — Assessment & Plan Note (Signed)
Endorses compliance to vitamin D orally. Repeat labs pending.

## 2019-10-27 NOTE — Patient Instructions (Addendum)
Stop by the lab prior to leaving today. I will notify you of your results once received.   Call the breast center to schedule your bone density scan.  Start exercising. You should be getting 150 minutes of exercise weekly.  It's important to improve your diet by reducing consumption of fast food, fried food, processed snack foods, sugary drinks. Increase consumption of fresh vegetables and fruits, whole grains, water.  Ensure you are drinking 64 ounces of water daily.  It was a pleasure to see you today!   Influenza (Flu) Vaccine (Inactivated or Recombinant): What You Need to Know 1. Why get vaccinated? Influenza vaccine can prevent influenza (flu). Flu is a contagious disease that spreads around the [place] every year, usually between October and May. Anyone can get the flu, but it is more dangerous for some people. Infants and young children, people 50 years of age and older, pregnant women, and people with certain health conditions or a weakened immune system are at greatest risk of flu complications. Pneumonia, bronchitis, sinus infections and ear infections are examples of flu-related complications. If you have a medical condition, such as heart disease, cancer or diabetes, flu can make it worse. Flu can cause fever and chills, sore throat, muscle aches, fatigue, cough, headache, and runny or stuffy nose. Some people may have vomiting and diarrhea, though this is more common in children than adults. Each year thousands of people in the [place] die from flu, and many more are hospitalized. Flu vaccine prevents millions of illnesses and flu-related visits to the doctor each year. 2. Influenza vaccine CDC recommends everyone 18 months of age and older get vaccinated every flu season. Children 6 months through 67 years of age may need 2 doses during a single flu season. Everyone else needs only 1 dose each flu season. It takes about 2 weeks for protection to develop after  vaccination. There are many flu viruses, and they are always changing. Each year a new flu vaccine is made to protect against three or four viruses that are likely to cause disease in the upcoming flu season. Even when the vaccine doesn't exactly match these viruses, it may still provide some protection. Influenza vaccine does not cause flu. Influenza vaccine may be given at the same time as other vaccines. 3. Talk with your health care provider Tell your vaccine provider if the person getting the vaccine:  Has had an allergic reaction after a previous dose of influenza vaccine, or has any severe, life-threatening allergies.  Has ever had Guillain-Barr Syndrome (also called GBS). In some cases, your health care provider may decide to postpone influenza vaccination to a future visit. People with minor illnesses, such as a cold, may be vaccinated. People who are moderately or severely ill should usually wait until they recover before getting influenza vaccine. Your health care provider can give you more information. 4. Risks of a vaccine reaction  Soreness, redness, and swelling where shot is given, fever, muscle aches, and headache can happen after influenza vaccine.  There may be a very small increased risk of Guillain-Barr Syndrome (GBS) after inactivated influenza vaccine (the flu shot). Young children who get the flu shot along with pneumococcal vaccine (PCV13), and/or DTaP vaccine at the same time might be slightly more likely to have a seizure caused by fever. Tell your health care provider if a child who is getting flu vaccine has ever had a seizure. People sometimes faint after medical procedures, including vaccination. Tell your provider if you  feel dizzy or have vision changes or ringing in the ears. As with any medicine, there is a very remote chance of a vaccine causing a severe allergic reaction, other serious injury, or death. 5. What if there is a serious problem? An allergic  reaction could occur after the vaccinated person leaves the clinic. If you see signs of a severe allergic reaction (hives, swelling of the face and throat, difficulty breathing, a fast heartbeat, dizziness, or weakness), call 9-1-1 and get the person to the nearest hospital. For other signs that concern you, call your health care provider. Adverse reactions should be reported to the Vaccine Adverse Event Reporting System (VAERS). Your health care provider will usually file this report, or you can do it yourself. Visit the VAERS website at www.vaers.SamedayNews.es or call (719)419-3965.VAERS is only for reporting reactions, and VAERS staff do not give medical advice. 6. The National Vaccine Injury Compensation Program The [employer] Vaccine Injury Compensation Program (VICP) is a federal program that was created to compensate people who may have been injured by certain vaccines. Visit the VICP website at GoldCloset.com.ee or call 757-743-3510 to learn about the program and about filing a claim. There is a time limit to file a claim for compensation. 7. How can I learn more?  Ask your healthcare provider.  Call your local or state health department.  Contact the Centers for Disease Control and Prevention (CDC): ? Call 2397624777 (1-800-CDC-INFO) or ? Visit CDC's https://gibson.com/ Vaccine Information Statement (Interim) Inactivated Influenza Vaccine (08/15/2017) This information is not intended to replace advice given to you by your health care provider. Make sure you discuss any questions you have with your health care provider. Document Revised: 04/08/2018 Document Reviewed: 08/19/2017 Elsevier Patient Education  [employer].

## 2019-10-27 NOTE — Assessment & Plan Note (Signed)
Discussed the importance of a healthy diet and regular exercise in order for weight loss, and to reduce the risk of any potential medical problems.  Repeat A1c pending.

## 2019-10-27 NOTE — Assessment & Plan Note (Signed)
Status post carpal tunnel release. Following with the orthopedics.

## 2019-10-27 NOTE — Assessment & Plan Note (Signed)
Appears euvolemic today. Follows with cardiology. Continue compression stockings.

## 2019-10-27 NOTE — Assessment & Plan Note (Signed)
Denies frequent dyspnea, uses albuterol inhaler infrequently. Continue to monitor.

## 2019-10-27 NOTE — Assessment & Plan Note (Signed)
Morning symptoms of phlegm and "slime", has been taking Pepcid recently with improvement. Prescription provided for famotidine 20 mg. She will update.

## 2019-10-28 ENCOUNTER — Other Ambulatory Visit: Payer: Self-pay | Admitting: Primary Care

## 2019-10-28 DIAGNOSIS — E038 Other specified hypothyroidism: Secondary | ICD-10-CM

## 2019-10-28 DIAGNOSIS — E559 Vitamin D deficiency, unspecified: Secondary | ICD-10-CM

## 2019-10-28 MED ORDER — LEVOTHYROXINE SODIUM 75 MCG PO TABS: ORAL_TABLET | ORAL | 3 refills | Status: DC

## 2019-10-28 MED ORDER — VITAMIN D (ERGOCALCIFEROL) 1.25 MG (50000 UNIT) PO CAPS: ORAL_CAPSULE | ORAL | 1 refills | Status: DC

## 2019-11-01 DIAGNOSIS — K5730 Diverticulosis of large intestine without perforation or abscess without bleeding: Secondary | ICD-10-CM | POA: Insufficient documentation

## 2019-11-01 DIAGNOSIS — Z860100 Personal history of colon polyps, unspecified: Secondary | ICD-10-CM | POA: Insufficient documentation

## 2019-11-01 HISTORY — DX: Diverticulosis of large intestine without perforation or abscess without bleeding: K57.30

## 2019-11-09 ENCOUNTER — Encounter: Payer: Self-pay | Admitting: Family

## 2019-11-18 ENCOUNTER — Other Ambulatory Visit: Payer: Self-pay | Admitting: Family

## 2019-11-20 ENCOUNTER — Telehealth: Payer: Self-pay | Admitting: Family

## 2019-11-20 NOTE — Telephone Encounter (Signed)
Called to check on BP after starting amlodipine.  She tells me she has not been checking her blood pressure at home.  Encouraged her to check so that we have readings to review at her upcoming visit in February.  BP goal less than 130/80.  Tells me she is tolerating the amlodipine well without issues with lightheadedness, dizziness, edema.  Loel Dubonnet, NP

## 2019-12-02 ENCOUNTER — Ambulatory Visit: Payer: Medicare Other | Admitting: Dermatology

## 2019-12-05 ENCOUNTER — Other Ambulatory Visit: Payer: Self-pay | Admitting: Primary Care

## 2019-12-05 DIAGNOSIS — K219 Gastro-esophageal reflux disease without esophagitis: Secondary | ICD-10-CM

## 2019-12-17 ENCOUNTER — Ambulatory Visit (INDEPENDENT_AMBULATORY_CARE_PROVIDER_SITE_OTHER): Payer: Medicare Other | Admitting: Dermatology

## 2019-12-17 ENCOUNTER — Other Ambulatory Visit: Payer: Self-pay

## 2019-12-17 DIAGNOSIS — I8312 Varicose veins of left lower extremity with inflammation: Secondary | ICD-10-CM

## 2019-12-17 DIAGNOSIS — I8311 Varicose veins of right lower extremity with inflammation: Secondary | ICD-10-CM | POA: Diagnosis not present

## 2019-12-17 NOTE — Progress Notes (Signed)
   Follow-Up Visit   Subjective  Teresa Jensen is a 74 y.o. female who presents for the following: Follow-up (6 wk f/u for lipodermatosclerosisof legs. Pt treating with clobetasol BID on weekends, compression, tacrolimus BID daily, and pentoxifylline 400 mg BID).    The following portions of the chart were reviewed this encounter and updated as appropriate:  Tobacco  Allergies  Meds  Problems  Med Hx  Surg Hx  Fam Hx      Review of Systems: No other skin or systemic complaints except as noted in HPI or Assessment and Plan.   Objective  Well appearing patient in no apparent distress; mood and affect are within normal limits.  A focused examination was performed including legs. Relevant physical exam findings are noted in the Assessment and Plan.  Objective  Right Lower Leg - Anterior: Hyperpigmented and erythematous patches and inverted bottle shape of lower legs. Erythematous patches at anterior shins tender to palpation.  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 Condition is bothersome to patient. No cure, only control. Currently flared.  D/c tacrolimus currently. Use prescription halobetasol foam BID x 2 weeks. Sample given today. Then restart tacrolimus ointment.  Continue pentoxyfilline  Consider stopping amlodipine and substituting with alterantive medication due to worsened edema at lower extremities which has likely contributed to worsening of lipodermatosclerosis and stasis dermatitis in the recent months.  Will discuss with colleagues and call patient with other recommended next steps in treatment since this is not responding well to conventional therapies.  Other Related Medications clobetasol ointment (TEMOVATE) 0.05 % tacrolimus (PROTOPIC) 0.1 %  ointment pentoxifylline (TRENTAL) 400 MG CR tablet  Return in about 1 month (around 01/17/2020).   I, Harriett Sine, CMA, am acting as scribe for Forest Gleason, MD.  Documentation: I have reviewed the above documentation for accuracy and completeness, and I agree with the above.  Forest Gleason, MD

## 2019-12-23 ENCOUNTER — Encounter: Payer: Self-pay | Admitting: Dermatology

## 2019-12-24 ENCOUNTER — Other Ambulatory Visit: Payer: Medicare Other

## 2019-12-28 ENCOUNTER — Other Ambulatory Visit: Payer: Self-pay | Admitting: Family

## 2019-12-28 NOTE — Telephone Encounter (Signed)
Rx request sent to pharmacy.  

## 2019-12-30 ENCOUNTER — Encounter: Payer: Self-pay | Admitting: Family

## 2019-12-31 ENCOUNTER — Encounter: Payer: Self-pay | Admitting: Family

## 2020-02-11 ENCOUNTER — Other Ambulatory Visit: Payer: Medicare Other

## 2020-02-24 ENCOUNTER — Ambulatory Visit (INDEPENDENT_AMBULATORY_CARE_PROVIDER_SITE_OTHER): Payer: Medicare Other | Admitting: Dermatology

## 2020-02-24 ENCOUNTER — Encounter: Payer: Self-pay | Admitting: Dermatology

## 2020-02-24 ENCOUNTER — Other Ambulatory Visit: Payer: Self-pay

## 2020-02-24 DIAGNOSIS — I8312 Varicose veins of left lower extremity with inflammation: Secondary | ICD-10-CM

## 2020-02-24 DIAGNOSIS — I8311 Varicose veins of right lower extremity with inflammation: Secondary | ICD-10-CM

## 2020-02-24 DIAGNOSIS — R600 Localized edema: Secondary | ICD-10-CM

## 2020-02-24 NOTE — Progress Notes (Signed)
Office Visit    Patient Name: Teresa Jensen Date of Encounter: 02/25/2020  Primary Care Provider:  Pleas Koch, NP Primary Cardiologist:  Nelva Bush, MD Electrophysiologist:  None   Chief Complaint    Teresa Jensen is a 75 y.o. female with a hx of HFpEF, HTN, COPD, polymyalgia rheumatica, GERD presents today for follow up of HFpEF, HTN.  Past Medical History    Past Medical History:  Diagnosis Date  . Anxiety   . CAP (community acquired pneumonia)   . COPD (chronic obstructive pulmonary disease) ([place])   . Dyspnea    DUE TO HEAT WITH COPD  . Epistaxis 01/09/2017  . GERD (gastroesophageal reflux disease)    OCC-NO MEDS  . Hypertension   . Hypothyroidism   . New onset of headaches   . Osteopenia   . PMR (polymyalgia rheumatica) ([facility])   . Pneumonia 02/13/2018  . PONV (postoperative nausea and vomiting)    VERY NAUSEATED AFTER SURGERY  . Stasis dermatitis   . Strain of chest wall 01/20/2011  . Tobacco abuse    Past Surgical History:  Procedure Laterality Date  . ABDOMINAL HYSTERECTOMY    . APPENDECTOMY    . BACK SURGERY    . CARPAL TUNNEL RELEASE Left   . CARPAL TUNNEL RELEASE Left 08/31/2019   Procedure: CARPAL TUNNEL RELEASE;  Surgeon: Dereck Leep, MD;  Location: [facility] ORS;  Service: Orthopedics;  Laterality: Left;  . CERVICAL FUSION    . CHOLECYSTECTOMY    . HEMORRHOID SURGERY    . LUMBAR FUSION    . TUBAL LIGATION      Allergies  Allergies  Allergen Reactions  . Prednisone Anaphylaxis, Hives, Shortness Of Breath, Itching and Swelling    The face swells HAS TAKEN CORTISONE SHOTS WITHOUT ISSUE BEFORE  . Bisphosphonates Other (See Comments)    Unsure  . Epinephrine   . Milk-Related Compounds Diarrhea  . Prevnar 13 [Pneumococcal 13-Val Conj Vacc] Other (See Comments)    Reaction not recalled by the patient  . Food Rash    MANGO = cause rashes around the mouth  . Mangifera Indica Rash    History of Present Illness    Teresa Jensen is a 75 y.o. female with a hx of HFpEF, COPD, HTN, polymyalgia rheumatica, GERD last seen 10/2019.  Initially seen 11/2018 by Dr. Saunders Revel following hospitalization for shortness of breath and weakness.  Chest x-ray suggestive of pulmonary edema.  Echocardiogram showed preserved LVEF with grade 1 diastolic dysfunction.  She did subsequent myocardial perfusion stress test which was low risk without evidence of ischemia.  At last clinic visit 05/22/2019 she reported stable exertional dyspnea.  Her losartan was increased to 100 mg daily due to suboptimal control of her blood pressure. Seen in clinic 06/29/19 she was euvolemic and her BP was much improved. Her medications were continued.   Seen in follow-up October 2021.  Her blood pressure was noted to be elevated though she was hesitant regarding medication changes. Through follow up MyChart message, Amlodipine 5mg  QD was started though subsequently discontinued due to lower extremity edema.  Presents today for follow up. Reports no shortness of breath. Her dyspnea on exertion has been stable. Reports no chest pain, pressure, or tightness. No edema, orthopnea, PND. Reports no palpitations. Enjoys spending time with her grandkids who are 56 and 44 years old and helps take care of them. She has lost 7 pounds over the last four months through watching her  diet carefully.   EKGs/Labs/Other Studies Reviewed:   The following studies were reviewed today:  EKG:  EKG performed today. The EKG performed today demonstrates NSR 87 bpm with nonspecific T wave abnormality and no acute ST/T wave changes.   Recent Labs: 10/27/2019: TSH 1.88 02/24/2020: ALT 21; BUN 8; Creatinine, Ser 0.61; Hemoglobin 14.0; Platelets 323; Potassium 3.9; Sodium 145  Recent Lipid Panel    Component Value Date/Time   CHOL 142 10/27/2019 1100   TRIG 99.0 10/27/2019 1100   HDL 40.70 10/27/2019 1100   CHOLHDL 3 10/27/2019 1100   VLDL 19.8 10/27/2019 1100   LDLCALC 82 10/27/2019 1100    Home Medications   Current Meds  Medication Sig  . albuterol (PROVENTIL HFA;VENTOLIN HFA) 108 (90 Base) MCG/ACT inhaler INHALE 2 PUFFS INTO THE LUNGS EVERY 4 (FOUR) HOURS AS NEEDED FOR SHORTNESS OF BREATH.  Marland Kitchen ALPRAZolam (XANAX) 0.25 MG tablet Take 1 tablet by mouth daily as needed for anxiety.  . famotidine (PEPCID) 20 MG tablet TAKE 1 TABLET (20 MG TOTAL) BY MOUTH AT BEDTIME. FOR HEARTBURN.  Marland Kitchen levothyroxine (SYNTHROID) 75 MCG tablet Take 1 tablet by mouth every morning on an empty stomach with water only.  No food or other medications for 30 minutes.  Marland Kitchen losartan (COZAAR) 100 MG tablet Take 1 tablet (100 mg total) by mouth daily.  . Multiple Vitamins-Minerals (PRESERVISION AREDS 2 PO) Take 1 tablet by mouth in the morning and at bedtime.  . tacrolimus (PROTOPIC) 0.1 % ointment Apply topically 2 (two) times daily.  . Vitamin D, Ergocalciferol, (DRISDOL) 1.25 MG (50000 UNIT) CAPS capsule Take 1 capsule by mouth once weekly for vitamin D.  . [DISCONTINUED] amLODipine (NORVASC) 5 MG tablet TAKE 1 TABLET BY MOUTH EVERY DAY   Review of Systems   Review of Systems  Constitutional: Negative for chills, fever and malaise/fatigue.  Cardiovascular: Positive for dyspnea on exertion. Negative for chest pain, leg swelling, near-syncope, orthopnea, palpitations and syncope.  Respiratory: Negative for cough, shortness of breath and wheezing.   Gastrointestinal: Negative for nausea and vomiting.  Neurological: Negative for dizziness, light-headedness and weakness.   All other systems reviewed and are otherwise negative except as noted above.  Physical Exam    VS:  BP 132/70 (BP Location: Left Arm, Patient Position: Sitting, Cuff Size: Normal)   Pulse 87   Ht 4\' 9"  (1.448 m)   Wt 176 lb (79.8 kg)   SpO2 92%   BMI 38.09 kg/m  , BMI Body mass index is 38.09 kg/m. GEN: Well nourished, overweight, well developed, in no acute distress. HEENT: normal. Neck: Supple, no JVD or masses. Cardiac: RRR,  no murmurs, rubs, or gallops. No clubbing, cyanosis. Trace pedal edema bilaterally.  Radials/DP/PT 2+ and equal bilaterally.  Respiratory:  Respirations regular and unlabored, clear to auscultation bilaterally. GI: Soft, nontender, nondistended, BS + x 4. MS: No deformity or atrophy. Skin: Warm and dry, no rash. Neuro:  Strength and sensation are intact. Psych: Normal affect.  Assessment & Plan    1. HFpEF - Euvolemic and well compensated on exam. DOE is stable at her baseline. GDMT includes ARB. No indication for loop diuretic at this time. Continues to wear her compression stockings.  Continue low-sodium, healthy diet.  Continue regular cardiovascular exercise.   2. HTN - BP well controlled. Continue current antihypertensive regimen including Losartan 100mg  daily. She was trialed on Amlodipine though noted some LE edema and it was discontinued, will defer resumption at this time as her BP is well  controlled. If additional agent needed in the future, could retrial vs utilize HCTZ.  3. COPD - Likely etiology of most of her dyspnea.  No signs of acute exacerbation.  Continue to follow with pulmonology and PCP.   Disposition:  Follow up  in 4 month(s) with Dr. Saunders Revel or APP   Loel Dubonnet, NP 02/25/2020, 9:06 AM

## 2020-02-24 NOTE — Progress Notes (Signed)
   Follow-Up Visit   Subjective  Teresa Jensen is a 75 y.o. female who presents for the following: Follow-up (Patient here today for follow up for lipodermatosclerosis. She is taking pentoxifylline and using tacrolimus topically. ).  Patient does not feel like she is having much improvement on pentoxifylline. She has some discomfort of her legs particularly at night.  Patient is seeing cardiologist tomorrow.  The following portions of the chart were reviewed this encounter and updated as appropriate:   Tobacco  Allergies  Meds  Problems  Med Hx  Surg Hx  Fam Hx      Review of Systems:  No other skin or systemic complaints except as noted in HPI or Assessment and Plan.  Objective  Well appearing patient in no apparent distress; mood and affect are within normal limits.  A focused examination was performed including lower legs. Relevant physical exam findings are noted in the Assessment and Plan.  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 Condition is bothersome to patient. Not currently at goal despite current therapy.  D/c pentoxifylline due to lack of improvement  Continue tacrolimus ointment twice daily to affected areas  Continue compression socks daily.   Will start hydroxychloroquine pending lab results (CBC and CMP), confirmation of no long QT syndrome (pt seeing cardiology tomorrow), and confirmation ok by ophthalmology (Dr. George Ina). Advised patient she may have to have another ophthalmology visit for special retinal exam if this hasn't been done.  Hydroxychloroquine (plaquenil) can rarely cause irreversible damage to the retina of the eye if used for a long period of time. This damage can be  identified and the medication stopped before vision changes are noticed by going for a yearly ophthalmology exam. Rarely, this medicine can cause low blood counts, liver inflammation, and/or a color change of the skin.  The use of Plaquenil requires long term medication management, including periodic office visits and monitoring of blood work.   Other Related Procedures CMP CBC with Differential/Platelets  Other Related Medications clobetasol ointment (TEMOVATE) 0.05 % tacrolimus (PROTOPIC) 0.1 % ointment  Localized edema Left Lower Leg - Anterior  With stasis dermatitis and lipodermatosclerosis  At last visit she had some increased swelling which she had noticed for a couple of months since increasing dose of amlodipine. We discussed at that time speaking with her team to see if there was an alternative option to see if edema improved off of amlodipine. She is unsure whether she has had a decrease in lower extremity swelling since stopping the amlodipine but she has less on exam today compared to last visit. May be reasonable to try restarting amlodipine and monitor for swelling.   Bilateral unna boots placed today  Return in about 1 week (around 03/02/2020) for unna boot.  Graciella Belton, RMA, am acting as scribe for Forest Gleason, MD .  Documentation: I have reviewed the above documentation for accuracy and completeness, and I agree with the above.  Forest Gleason, MD

## 2020-02-25 ENCOUNTER — Encounter: Payer: Self-pay | Admitting: Family

## 2020-02-25 ENCOUNTER — Ambulatory Visit (INDEPENDENT_AMBULATORY_CARE_PROVIDER_SITE_OTHER): Payer: Medicare Other | Admitting: Family

## 2020-02-25 VITALS — BP 132/70 | HR 87 | Ht <= 58 in | Wt 176.0 lb

## 2020-02-25 DIAGNOSIS — I10 Essential (primary) hypertension: Secondary | ICD-10-CM

## 2020-02-25 DIAGNOSIS — J449 Chronic obstructive pulmonary disease, unspecified: Secondary | ICD-10-CM | POA: Diagnosis not present

## 2020-02-25 DIAGNOSIS — I5032 Chronic diastolic (congestive) heart failure: Secondary | ICD-10-CM | POA: Diagnosis not present

## 2020-02-25 LAB — CBC WITH DIFFERENTIAL/PLATELET
Basophils Absolute: 0.1 10*3/uL (ref 0.0–0.2)
Basos: 1 %
EOS (ABSOLUTE): 0.1 10*3/uL (ref 0.0–0.4)
Eos: 1 %
Hematocrit: 43.9 % (ref 34.0–46.6)
Hemoglobin: 14 g/dL (ref 11.1–15.9)
Immature Grans (Abs): 0 10*3/uL (ref 0.0–0.1)
Immature Granulocytes: 0 %
Lymphocytes Absolute: 2.8 10*3/uL (ref 0.7–3.1)
Lymphs: 28 %
MCH: 28.5 pg (ref 26.6–33.0)
MCHC: 31.9 g/dL (ref 31.5–35.7)
MCV: 89 fL (ref 79–97)
Monocytes Absolute: 1 10*3/uL — ABNORMAL HIGH (ref 0.1–0.9)
Monocytes: 10 %
Neutrophils Absolute: 6.1 10*3/uL (ref 1.4–7.0)
Neutrophils: 60 %
Platelets: 323 10*3/uL (ref 150–450)
RBC: 4.92 x10E6/uL (ref 3.77–5.28)
RDW: 14.5 % (ref 11.7–15.4)
WBC: 10.1 10*3/uL (ref 3.4–10.8)

## 2020-02-25 LAB — COMPREHENSIVE METABOLIC PANEL
ALT: 21 IU/L (ref 0–32)
AST: 20 IU/L (ref 0–40)
Albumin/Globulin Ratio: 1.3 (ref 1.2–2.2)
Albumin: 4.1 g/dL (ref 3.7–4.7)
Alkaline Phosphatase: 85 IU/L (ref 44–121)
BUN/Creatinine Ratio: 13 (ref 12–28)
BUN: 8 mg/dL (ref 8–27)
Bilirubin Total: 0.2 mg/dL (ref 0.0–1.2)
CO2: 23 mmol/L (ref 20–29)
Calcium: 9.2 mg/dL (ref 8.7–10.3)
Chloride: 105 mmol/L (ref 96–106)
Creatinine, Ser: 0.61 mg/dL (ref 0.57–1.00)
GFR calc Af Amer: 103 mL/min/{1.73_m2} (ref >59)
GFR calc non Af Amer: 90 mL/min/{1.73_m2} (ref >59)
Globulin, Total: 3.2 g/dL (ref 1.5–4.5)
Glucose: 114 mg/dL — ABNORMAL HIGH (ref 65–99)
Potassium: 3.9 mmol/L (ref 3.5–5.2)
Sodium: 145 mmol/L — ABNORMAL HIGH (ref 134–144)
Total Protein: 7.3 g/dL (ref 6.0–8.5)

## 2020-02-25 NOTE — Patient Instructions (Addendum)
Medication Instructions:  No medication changes today.   We will defer adding back Amlodipine since your blood pressure is well controlled today. If you notice your blood pressure is remaining elevated, please call our office.   *If you need a refill on your cardiac medications before your next appointment, please call your pharmacy*  Lab Work: None ordered today.   Testing/Procedures: Your EKG today showed normal sinus rhythm.   Follow-Up: At [facility], you and your health needs are our priority.  As part of our continuing mission to provide you with exceptional heart care, we have created designated Provider Care Teams.  These Care Teams include your primary Cardiologist (physician) and Advanced Practice Providers (APPs -  Physician Assistants and Nurse Practitioners) who all work together to provide you with the care you need, when you need it.  We recommend signing up for the patient portal called "MyChart".  Sign up information is provided on this After Visit Summary.  MyChart is used to connect with patients for Virtual Visits (Telemedicine).  Patients are able to view lab/test results, encounter notes, upcoming appointments, etc.  Non-urgent messages can be sent to your provider as well.   To learn more about what you can do with MyChart, go to [email or web address].    Your next appointment:   4 month(s)  The format for your next appointment:   In Person  Provider:   You may see Nelva Bush, MD or one of the following Advanced Practice Providers on your designated Care Team:    Murray Hodgkins, NP  Christell Faith, PA-C  Marrianne Mood, PA-C  [place], [place]  Laurann Montana, NP   Other Instructions  Congratulations on the weight loss!

## 2020-02-29 ENCOUNTER — Telehealth: Payer: Self-pay | Admitting: Dermatology

## 2020-02-29 ENCOUNTER — Other Ambulatory Visit: Payer: Self-pay

## 2020-02-29 ENCOUNTER — Encounter: Payer: Self-pay | Admitting: Family

## 2020-02-29 ENCOUNTER — Encounter: Payer: Self-pay | Admitting: Dermatology

## 2020-02-29 DIAGNOSIS — I8311 Varicose veins of right lower extremity with inflammation: Secondary | ICD-10-CM

## 2020-02-29 MED ORDER — HYDROXYCHLOROQUINE SULFATE 200 MG PO TABS: 200.0000 mg | ORAL_TABLET | Freq: Two times a day (BID) | ORAL | 1 refills | Status: DC

## 2020-02-29 NOTE — Telephone Encounter (Signed)
Received call back from [facility]  They do not have any concerns about Teresa Jensen starting Plaquenil.  We will plan to have her follow-up with Teresa Jensen within 1 year after starting. They are in agreement.

## 2020-03-02 ENCOUNTER — Ambulatory Visit: Payer: Medicare Other | Admitting: Dermatology

## 2020-03-08 ENCOUNTER — Encounter: Payer: Self-pay | Admitting: Dermatology

## 2020-03-08 ENCOUNTER — Ambulatory Visit (INDEPENDENT_AMBULATORY_CARE_PROVIDER_SITE_OTHER): Payer: Medicare Other | Admitting: Dermatology

## 2020-03-08 ENCOUNTER — Other Ambulatory Visit: Payer: Self-pay

## 2020-03-08 DIAGNOSIS — I872 Venous insufficiency (chronic) (peripheral): Secondary | ICD-10-CM

## 2020-03-08 DIAGNOSIS — I8311 Varicose veins of right lower extremity with inflammation: Secondary | ICD-10-CM

## 2020-03-08 DIAGNOSIS — I8312 Varicose veins of left lower extremity with inflammation: Secondary | ICD-10-CM | POA: Diagnosis not present

## 2020-03-08 MED ORDER — HYDROXYCHLOROQUINE SULFATE 200 MG PO TABS: 400.0000 mg | ORAL_TABLET | Freq: Every day | ORAL | 1 refills | Status: DC

## 2020-03-08 NOTE — Progress Notes (Signed)
   Follow-Up Visit   Subjective  Teresa Jensen is a 75 y.o. female who presents for the following: Follow-up (Follow-up (Patient here today for follow up for lipodermatosclerosis. She is taking Plaquenil tablets and using tacrolimus topically. Pt took her Unna boot off after 2 days because of her shoes made the [employer] fold //Patient does not feel like she is having much improvement on pentoxifylline. She has some discomfort of her legs particularly at night. ).   The following portions of the chart were reviewed this encounter and updated as appropriate:   Tobacco  Allergies  Meds  Problems  Med Hx  Surg Hx  Fam Hx      Review of Systems:  No other skin or systemic complaints except as noted in HPI or Assessment and Plan.  Objective  Well appearing patient in no apparent distress; mood and affect are within normal limits.  A focused examination was performed including lower legs . Relevant physical exam findings are noted in the Assessment and Plan.  Objective  Right Lower Leg - Anterior: Pretibial and medial calf erythema, inverted bottle shape  Objective  Left Lower Leg - Anterior: Pitting edema bilaterally with erythema   Assessment & Plan  Lipodermatosclerosis of both lower extremities Right Lower Leg - Anterior  Chronic condition with duration over one year. Condition is bothersome to patient. Currently not at goal.  Tolerating plaquenil well. Ok to take two 200 mg tablets daily instead of one tablet twice a day  Will send in 3 month supply  Advised can take 2-3 months to see benefit from plaquenil  Will recheck labs in 6 months.  Stasis dermatitis of both legs Left Lower Leg - Anterior  Vaseline applied to feet and legs and Unna boots placed bilaterally  Will recheck in 1 week   Return in about 1 week (around 03/15/2020) for [employer] .   I, Marye Round, CMA, am acting as scribe for Forest Gleason, MD .  Documentation: I have reviewed the above  documentation for accuracy and completeness, and I agree with the above.  Forest Gleason, MD

## 2020-03-08 NOTE — Progress Notes (Deleted)
   Follow-Up Visit   Subjective  Teresa Jensen is a 75 y.o. female who presents for the following: Follow-up (Follow-up (Patient here today for follow up for lipodermatosclerosis. She is taking Plaquenil tablets and using tacrolimus topically. Pt took her Unna boot off after 2 days because of her shoes made the [employer] fold //Patient does not feel like she is having much improvement on pentoxifylline. She has some discomfort of her legs particularly at night. ).   The following portions of the chart were reviewed this encounter and updated as appropriate:   Tobacco  Allergies  Meds  Problems  Med Hx  Surg Hx  Fam Hx      Review of Systems:  No other skin or systemic complaints except as noted in HPI or Assessment and Plan.  Objective  Well appearing patient in no apparent distress; mood and affect are within normal limits.  A focused examination was performed including lower legs . Relevant physical exam findings are noted in the Assessment and Plan.  Objective  Right Lower Leg - Anterior: Pretibial and medial calf erythema, inverted bottle shape  Objective  Left Lower Leg - Anterior: Pitting edema bilaterally with erythema   Assessment & Plan  Lipodermatosclerosis of both lower extremities Right Lower Leg - Anterior  Chronic condition with duration over one year. Condition is bothersome to patient. Currently not at goal.  Tolerating plaquenil well. Ok to take two 200 mg tablets daily instead of one tablet twice a day  Will send in 3 month supply  Advised can take 2-3 months to see benefit from plaquenil  Will recheck labs in 6 months.  Stasis dermatitis of both legs Left Lower Leg - Anterior  Unna boots placed bilaterally  Will recheck in 1 week   Return in about 1 week (around 03/15/2020) for [employer] .   I, Marye Round, CMA, am acting as scribe for Forest Gleason, MD .

## 2020-03-10 ENCOUNTER — Ambulatory Visit
Admission: RE | Admit: 2020-03-10 | Discharge: 2020-03-10 | Disposition: A | Discharge status: Home / Self Care | Payer: Medicare Other | Source: Ambulatory Visit | Attending: Primary Care | Admitting: Primary Care

## 2020-03-10 ENCOUNTER — Other Ambulatory Visit: Payer: Self-pay

## 2020-03-10 DIAGNOSIS — E2839 Other primary ovarian failure: Secondary | ICD-10-CM | POA: Insufficient documentation

## 2020-03-13 DIAGNOSIS — M810 Age-related osteoporosis without current pathological fracture: Secondary | ICD-10-CM

## 2020-03-16 ENCOUNTER — Ambulatory Visit (INDEPENDENT_AMBULATORY_CARE_PROVIDER_SITE_OTHER): Payer: Medicare Other | Admitting: Dermatology

## 2020-03-16 ENCOUNTER — Encounter: Payer: Self-pay | Admitting: Dermatology

## 2020-03-16 ENCOUNTER — Other Ambulatory Visit: Payer: Self-pay

## 2020-03-16 DIAGNOSIS — I872 Venous insufficiency (chronic) (peripheral): Secondary | ICD-10-CM | POA: Diagnosis not present

## 2020-03-16 NOTE — Progress Notes (Signed)
   Follow-Up Visit   Subjective  Teresa Jensen is a 75 y.o. female who presents for the following: Follow-up (1 week f/u Lipodermatosclerosis on bilateral lower legs, treating with Unna boots, hydroxychloroquine). Doing well.  The following portions of the chart were reviewed this encounter and updated as appropriate:  Tobacco  Allergies  Meds  Problems  Med Hx  Surg Hx  Fam Hx      Review of Systems: No other skin or systemic complaints except as noted in HPI or Assessment and Plan.   Objective  Well appearing patient in no apparent distress; mood and affect are within normal limits.  A focused examination was performed including legs. Relevant physical exam findings are noted in the Assessment and Plan.  Objective  bilateral legs: Erythematous patches at bilateral pretibia   Assessment & Plan  Stasis dermatitis of both legs bilateral legs  With lipodermatosclerosis  Chronic condition with duration or expected duration over one year. Currently well-controlled.  Now without pain s/p week in [employer]. Currently asymptomatic and well-controlled.  D/c tacrolimus for now. If legs start to get itchy or sensitive, may restart tacrolimus prn.   Continue use of support hose.   Continue hydroxychloroquine 200 mg twice a day.   Pt to call if she develops pain again and we will consider Unna boot again  Return in about 3 months (around 06/16/2020) for statis derm b/l legs.   I, Harriett Sine, CMA, am acting as scribe for Forest Gleason, MD.  Documentation: I have reviewed the above documentation for accuracy and completeness, and I agree with the above.  Forest Gleason, MD

## 2020-03-18 ENCOUNTER — Other Ambulatory Visit: Payer: Self-pay

## 2020-03-21 ENCOUNTER — Telehealth: Payer: Self-pay | Admitting: Primary Care

## 2020-03-21 NOTE — Telephone Encounter (Signed)
Patient's husband came in office and dropped off handicap placard form. Placed form in script tower.

## 2020-03-21 NOTE — Telephone Encounter (Signed)
Sent my chart message with request to [place].

## 2020-03-22 NOTE — Telephone Encounter (Signed)
Form complete and placed in Joellen's inbox.

## 2020-03-22 NOTE — Telephone Encounter (Signed)
Copy sent to scan and original placed at reception

## 2020-03-23 ENCOUNTER — Other Ambulatory Visit: Payer: Self-pay | Admitting: Family

## 2020-03-23 DIAGNOSIS — I10 Essential (primary) hypertension: Secondary | ICD-10-CM

## 2020-03-23 MED ORDER — ALENDRONATE SODIUM 70 MG PO TABS: 70.0000 mg | ORAL_TABLET | ORAL | 0 refills | Status: DC

## 2020-03-27 ENCOUNTER — Encounter: Payer: Self-pay | Admitting: Dermatology

## 2020-03-30 ENCOUNTER — Encounter: Payer: Self-pay | Admitting: Family

## 2020-04-29 ENCOUNTER — Other Ambulatory Visit: Payer: Self-pay | Admitting: Primary Care

## 2020-04-29 DIAGNOSIS — K219 Gastro-esophageal reflux disease without esophagitis: Secondary | ICD-10-CM

## 2020-06-08 ENCOUNTER — Other Ambulatory Visit: Payer: Self-pay | Admitting: Primary Care

## 2020-06-08 DIAGNOSIS — Z1231 Encounter for screening mammogram for malignant neoplasm of breast: Secondary | ICD-10-CM

## 2020-06-09 DIAGNOSIS — E559 Vitamin D deficiency, unspecified: Secondary | ICD-10-CM

## 2020-06-14 ENCOUNTER — Other Ambulatory Visit: Payer: Self-pay | Admitting: Primary Care

## 2020-06-14 DIAGNOSIS — M810 Age-related osteoporosis without current pathological fracture: Secondary | ICD-10-CM

## 2020-06-16 ENCOUNTER — Ambulatory Visit (INDEPENDENT_AMBULATORY_CARE_PROVIDER_SITE_OTHER): Payer: Medicare Other | Admitting: Dermatology

## 2020-06-16 ENCOUNTER — Other Ambulatory Visit: Payer: Self-pay

## 2020-06-16 ENCOUNTER — Encounter: Payer: Self-pay | Admitting: Dermatology

## 2020-06-16 DIAGNOSIS — I872 Venous insufficiency (chronic) (peripheral): Secondary | ICD-10-CM

## 2020-06-16 NOTE — Progress Notes (Signed)
   Follow-Up Visit   Subjective  Teresa Jensen is a 75 y.o. female who presents for the following: 3 month follow up (Patient here today for 3 month follow up on stasis dermatitis of both legs.  Patient reports legs are a little sore but feels that are getting better there. ).  Last eye exam early 2022  The following portions of the chart were reviewed this encounter and updated as appropriate:  Tobacco  Allergies  Meds  Problems  Med Hx  Surg Hx  Fam Hx       Objective  Well appearing patient in no apparent distress; mood and affect are within normal limits.  A focused examination was performed including bilateral legs. Relevant physical exam findings are noted in the Assessment and Plan.  bilateral legs Mild pretibial erythema, some scarring and associated lower leg edema.   Assessment & Plan  Stasis dermatitis of both legs bilateral legs  With lipodermatosclerosis  Chronic condition with duration over one year. Currently well-controlled.  If legs start to get itchy or sensitive, may restart tacrolimus twice a day as needed   Continue use of compression stockings  Continue hydroxychloroquine 200 mg twice a day. Tolerating well.  Will review labs next week as they are already ordered before her carpal tunnel surgery.  Last eye exam early 2022; due next year.   Recheck labs at follow up  Return in about 6 months (around 12/16/2020) for stasis derm follow up.  Call for any worsening before then.  I, Ruthell Rummage, CMA, am acting as scribe for Forest Gleason, MD.   Documentation: I have reviewed the above documentation for accuracy and completeness, and I agree with the above.  Forest Gleason, MD

## 2020-06-16 NOTE — Patient Instructions (Addendum)
Hydroxychloroquine (plaquenil) can rarely cause irreversible damage to the retina of the eye if used for a long period of time. This damage can be identified and the medication stopped before vision changes are noticed by going for a yearly ophthalmology exam. Rarely, this medicine can cause low blood counts, liver inflammation, and/or a color change of the skin.  The use of Plaquenil requires long term medication management, including periodic office visits and monitoring of blood work.       If you have any questions or concerns for your doctor, please call our main line at [phone] and press option 4 to reach your doctor's medical assistant. If no one answers, please leave a voicemail as directed and we will return your call as soon as possible. Messages left after 4 pm will be answered the following business day.   You may also send [place] a message via [place]. We typically respond to MyChart messages within 1-2 business days.  For prescription refills, please ask your pharmacy to contact our office. Our fax number is [phone].  If you have an urgent issue when the clinic is closed that cannot wait until the next business day, you can [place] your doctor at the number below.    Please note that while we do our best to be available for urgent issues outside of office hours, we are not available 24/7.   If you have an urgent issue and are unable to reach [place], you may choose to seek medical care at your doctor's office, retail clinic, urgent care center, or emergency room.  If you have a medical emergency, please immediately call 911 or go to the emergency department.  Pager Numbers  - Dr. Nehemiah Massed: [phone]  - Dr. Laurence Ferrari: [phone]  - Dr. Nicole Kindred: [phone]  In the event of inclement weather, please call our main line at [phone] for an update on the status of any delays or closures.  Dermatology Medication Tips: Please keep the boxes that topical medications come in in  order to help keep track of the instructions about where and how to use these. Pharmacies typically print the medication instructions only on the boxes and not directly on the medication tubes.   If your medication is too expensive, please contact our office at [phone] option 4 or send [place] a message through [place].   We are unable to tell what your co-pay for medications will be in advance as this is different depending on your insurance coverage. However, we may be able to find a substitute medication at lower cost or fill out paperwork to get insurance to cover a needed medication.   If a prior authorization is required to get your medication covered by your insurance company, please allow [place] 1-2 business days to complete this process.  Drug prices often vary depending on where the prescription is filled and some pharmacies may offer cheaper prices.  The website www.goodrx.com contains coupons for medications through different pharmacies. The prices here do not account for what the cost may be with help from insurance (it may be cheaper with your insurance), but the website can give you the price if you did not use any insurance.  - You can print the associated coupon and take it with your prescription to the pharmacy.  - You may also stop by our office during regular business hours and pick up a GoodRx coupon card.  - If you need your prescription sent electronically to a different pharmacy, notify our office through [facility]  MyChart or by phone at [phone] option 4.

## 2020-06-17 ENCOUNTER — Other Ambulatory Visit: Payer: Self-pay

## 2020-06-17 ENCOUNTER — Telehealth: Payer: Self-pay | Admitting: *Deleted

## 2020-06-17 ENCOUNTER — Other Ambulatory Visit
Admission: RE | Admit: 2020-06-17 | Discharge: 2020-06-17 | Disposition: A | Discharge status: Home / Self Care | Payer: Medicare Other | Source: Ambulatory Visit | Attending: Orthopedic Surgery | Admitting: Orthopedic Surgery

## 2020-06-17 ENCOUNTER — Encounter: Payer: Self-pay | Admitting: Orthopedic Surgery

## 2020-06-17 DIAGNOSIS — Z01818 Encounter for other preprocedural examination: Secondary | ICD-10-CM | POA: Insufficient documentation

## 2020-06-17 DIAGNOSIS — I10 Essential (primary) hypertension: Secondary | ICD-10-CM | POA: Insufficient documentation

## 2020-06-17 HISTORY — DX: Zoster without complications: B02.9

## 2020-06-17 LAB — BASIC METABOLIC PANEL
Anion gap: 7 (ref 5–15)
BUN: 20 mg/dL (ref 8–23)
CO2: 28 mmol/L (ref 22–32)
Calcium: 8.9 mg/dL (ref 8.9–10.3)
Chloride: 104 mmol/L (ref 98–111)
Creatinine, Ser: 1.07 mg/dL — ABNORMAL HIGH (ref 0.44–1.00)
GFR, Estimated: 54 mL/min — ABNORMAL LOW (ref >60)
Glucose, Bld: 134 mg/dL — ABNORMAL HIGH (ref 70–99)
Potassium: 3.9 mmol/L (ref 3.5–5.1)
Sodium: 139 mmol/L (ref 135–145)

## 2020-06-17 LAB — CBC
HCT: 41.8 % (ref 36.0–46.0)
Hemoglobin: 13.6 g/dL (ref 12.0–15.0)
MCH: 30.4 pg (ref 26.0–34.0)
MCHC: 32.5 g/dL (ref 30.0–36.0)
MCV: 93.3 fL (ref 80.0–100.0)
Platelets: 271 10*3/uL (ref 150–400)
RBC: 4.48 MIL/uL (ref 3.87–5.11)
RDW: 13.5 % (ref 11.5–15.5)
WBC: 10 10*3/uL (ref 4.0–10.5)
nRBC: 0 % (ref 0.0–0.2)

## 2020-06-17 NOTE — Telephone Encounter (Signed)
   Name: Teresa Jensen  DOB: 08/09/1945  MRN: [id number]   Primary Cardiologist: Nelva Bush, MD  Chart reviewed as part of pre-operative protocol coverage. Patient was contacted 06/17/2020 in reference to pre-operative risk assessment for pending surgery as outlined below.  Teresa Jensen was last seen on 02/25/20 by Laurann Montana, NP.  Since that day, Teresa Jensen has done well.   Therefore, based on ACC/AHA guidelines, the patient would be at acceptable risk for the planned procedure without further cardiovascular testing.   The patient was advised that if she develops new symptoms prior to surgery to contact our office to arrange for a follow-up visit, and she verbalized understanding.  I will route this recommendation to the requesting party via Epic fax function and remove from pre-op pool. Please call with questions.  [place], [place] 06/17/2020, 4:14 PM

## 2020-06-17 NOTE — Patient Instructions (Signed)
INSTRUCTIONS FOR SURGERY     Your surgery is scheduled for:   Wednesday,June 22ND     To find out your arrival time for the day of surgery,          please call [phone] between 1 pm and 3 pm on : Tuesday, June 21ST     When you arrive for surgery, report to the [facility]. ONCE THEY HAVE COMPLETED THEIR PROCESS,  PROCEED TO THE SECOND  FLOOR AND SIGN IN AT THE SURGERY DESK.    REMEMBER: Instructions that are not followed completely may result in serious medical risk,  up to and including death, or upon the discretion of your surgeon and anesthesiologist,            your surgery may need to be rescheduled.  __X__ 1. Do not eat food after midnight the night before your procedure.                    No gum, candy, lozenger, tic tacs, tums or hard candies.                  ABSOLUTELY NOTHING SOLID IN YOUR MOUTH AFTER MIDNIGHT                    You may drink unlimited clear liquids up to 2 hours before you are scheduled to arrive for surgery.                   Do not drink anything within those 2 hours unless you need to take medicine, then take the                   smallest amount you need.  Clear liquids include:  water, apple juice without pulp,                   any flavor Gatorade, Black coffee, black tea.  Sugar may be added but no dairy/ honey /lemon.                        Broth and jello is not considered a clear liquid.  __x__  2. On the morning of surgery, please brush your teeth with toothpaste and water. You may rinse with                  mouthwash if you wish but DO NOT SWALLOW TOOTHPASTE OR MOUTHWASH  __X___3. NO alcohol for 24 hours before or after surgery.  __x___ 4.  Do NOT smoke or use e-cigarettes for 24 HOURS PRIOR TO SURGERY.                      DO NOT  Use any chewable tobacco products for at least 6 hours prior to surgery.  __x___ 5. If you start any new  medication after this appointment and prior to surgery, please                   Bring it with you on the day of surgery.  ___x__ 6. Notify  your doctor if there is any change in your medical condition, such as fever,                   infection, vomitting, diarrhea or any open sores.  __x___ 7.  USE the CHG SOAP as instructed, the night before surgery and the day of surgery.                   Once you have washed with this soap, do NOT use any of the following: Powders, perfumes                    or lotions. Please do not wear make up, hairpins, clips or nail polish. You may wear deodorant.                                                Women need to shave 48 hours prior to surgery.                   DO NOT wear ANY jewelry on the day of surgery. If there are rings that are too tight to                    remove easily, please address this prior to the surgery day. Piercings need to be removed.                                                                     NO METAL ON YOUR BODY.                    Do NOT bring any valuables.  If you came to Pre-Admit testing then you will not need license,                     insurance card or credit card.  If you will be staying overnight, please either leave your things in                     the car or have your family be responsible for these items.                     [facility] IS NOT RESPONSIBLE FOR BELONGINGS OR VALUABLES.  ___X__ 8. DO NOT wear contact lenses on surgery day.  You may not have dentures,                     Hearing aides, contacts or glasses in the operating room. These items can be                    Placed in the Recovery Room to receive immediately after surgery.  __x___ 9. IF YOU ARE SCHEDULED TO GO HOME ON THE SAME DAY, YOU MUST                   Have someone to drive you home and to stay with you  for the first 24 hours.  Have an arrangement prior to arriving on surgery day.  ___x__ 10. Take the  following medications on the morning of surgery with a sip of water:                              1.famotadine(take an extra dose the night before surgery)                     2.plaquenil                     3.synthroid                     4.xanax, if needed                     5.                     6.  ____x_ 11.  Follow any instructions provided to you by your surgeon.                        Such as enema, clear liquid bowel prep                      PLEASE COMPLETE THE SURGICAL CARBOHYDRATE DRINK BY THE                      TIME YOU LEAVE YOUR HOUSE IN THE MORNING.  __X__  12. STOP ALL ASPIRIN PRODUCTS AS OF TODAY (06/17/20)                       THIS INCLUDES BC POWDERS / GOODIES POWDER  __x___ 13. STOP Anti-inflammatories as of TODAY (06/17/20)                      This includes IBUPROFEN / MOTRIN / ADVIL / [place] TO SURGERY.  ___X__ 14.  Stop supplements until after surgery.                     This includes: CALCIUM - VITAMIN D // MULTIVITAMINS               __X____17.  Continue to take the following medications but do not take on the morning of surgery:                              LOSARTAN  ______18.  Wear clean and comfortable clothing to the hospital.        BRING PHONE [place]

## 2020-06-17 NOTE — Telephone Encounter (Signed)
    Teresa Jensen DOB:  Aug 05, 1945  MRN:  [id number]   Primary Cardiologist: Nelva Bush, MD  Chart reviewed as part of pre-operative protocol coverage.  Left voice mail to call back.   [place], [place] 06/17/2020, 3:46 PM

## 2020-06-17 NOTE — Telephone Encounter (Signed)
Request for pre-operative cardiac clearance Received: Today Karen Kitchens, NP  P Cv Div Preop Callback Request for pre-operative cardiac clearance:     1. What type of surgery is being performed?  RIGHT CARPAL TUNNEL RELEASE   2. When is this surgery scheduled?  06/22/2020     3. Are there any medications that need to be held prior to surgery?  NONE   4. Practice name and name of physician performing surgery?  Performing surgeon: Dr. Skip Estimable, MD  Requesting clearance: Honor Loh, FNP-C       5. Anesthesia type (none, local, MAC, general)? General   6. What is the office phone and fax number?    Phone: [phone]  Fax: [phone]   ATTENTION: Unable to create telephone message as per your standard workflow. Directed by [facility] providers to send requests for cardiac clearance to this pool for appropriate distribution to provider covering pre-operative clearances.   Honor Loh, MSN, APRN, FNP-C, CEN  [facility]  Peri-operative Services Nurse Practitioner  Phone: [phone]  06/17/20 3:29 PM

## 2020-06-17 NOTE — Discharge Instructions (Addendum)
Instructions after Hand / Wrist Surgery   James P. Holley Bouche., M.D.  Dept. of [facility]  [place], [place]  [address]   Phone: [phone]   Fax: [phone]   DIET: Drink plenty of non-alcoholic fluids & begin a light diet. Resume your normal diet the day after surgery.  ACTIVITY:  Keep the hand elevated above the level of the elbow. Begin gently moving the fingers on a regular basis to avoid stiffness. Avoid any heavy lifting, pushing, or pulling with the operative hand. Do not drive or operate any equipment until instructed.  WOUND CARE:  Keep the splint/bandage clean and dry.  The splint and stitches will be removed in the office. Continue to use the ice packs periodically to reduce pain and swelling. You may bathe or shower after the stitches are removed at the first office visit following surgery.  MEDICATIONS: You may resume your regular medications. Please take the pain medication as prescribed. Do not take pain medication on an empty stomach. Do not drive or drink alcoholic beverages when taking pain medications.  CALL THE OFFICE FOR: Temperature above 101 degrees Excessive bleeding or drainage on the dressing. Excessive swelling, coldness, or paleness of the fingers. Persistent nausea and vomiting.  FOLLOW-UP:  You should have an appointment to return to the office in 7-10 days after surgery.   REMEMBER: R.I.C.E. = Rest, Ice, Compression, Elevation !     [facility] Department Directory         [email or web address]       [email or web address]          Cardiology  Appointments: [place] [phone]  Endocrinology  Appointments: [place] [phone] [place] [phone]  Gastroenterology  Appointments: [place] [phone] [place] [phone]        General Surgery   Appointments: [facility]  Internal Medicine/Family Medicine  Appointments: [facility] [place] - [phone] [place] [phone]  Metabolic and [place] Loss Surgery  Appointments: [facility]        Neurology  Appointments: [place] [phone] [place] - [phone]  Neurosurgery  Appointments: [place]  Obstetrics & Gynecology  Appointments: [place] [phone] [place] - [phone]        Pediatrics  Appointments: [place] [phone] [place] - [phone]  Physiatry  Appointments: [place] [phone]  Physical Therapy  Appointments: [place] [phone]        Podiatry  Appointments: [place] [phone] [place] - [phone]  Pulmonology  Appointments: [place]  Rheumatology  Appointments: [place] [phone]        [place] Location: [facility]  [address] [place], [place]  [address]  [place] Location: [facility] [address], [place]  [address]  [place] Location: [facility] [address] [place], [place]  [address]   AMBULATORY SURGERY  DISCHARGE INSTRUCTIONS   The drugs that you were given will stay in your system until tomorrow so for the next 24 hours you should not:  Drive an automobile Make any legal decisions Drink any alcoholic beverage   You may resume regular meals tomorrow.  Today it is better to start with liquids and gradually work up to solid foods.  You may eat anything you prefer, but it is better to start with liquids, then soup and crackers, and gradually work up to solid foods.   Please notify your doctor immediately if you have any  unusual bleeding, trouble breathing, redness and pain at the surgery site, drainage, fever, or pain not relieved by medication.    Your post-operative visit with Dr.                                       is: Date:                        Time:    Please call  to schedule your post-operative visit.  Additional Instructions:

## 2020-06-17 NOTE — Progress Notes (Signed)
Perioperative Services  Pre-Admission/Anesthesia Testing Clinical Review  Date: 06/17/20  Patient Demographics:  Name: Teresa Jensen DOB:   10-26-1945 MRN:   [id number]  Planned Surgical Procedure(s):    Case: [id number] Date/Time: 06/22/20 1115   Procedure: CARPAL TUNNEL RELEASE (Right: Wrist)   Anesthesia type: Choice   Pre-op diagnosis: CARPAL TUNNEL SYNDROME, RIGHT.   Location: [facility] OR ROOM 01 / [facility] ORS FOR ANESTHESIA GROUP   Surgeons: Dereck Leep, MD     NOTE: Available PAT nursing documentation and vital signs have been reviewed. Clinical nursing staff has updated patient's PMH/PSHx, current medication list, and drug allergies/intolerances to ensure comprehensive history available to assist in medical decision making as it pertains to the aforementioned surgical procedure and anticipated anesthetic course. Extensive review of available clinical information performed. [place] PMH and PSHx updated with any diagnoses/procedures that  may have been inadvertently omitted during her intake with the pre-admission testing department's nursing staff.  Clinical Discussion:  Teresa Jensen is a 75 y.o. female who is submitted for pre-surgical anesthesia review and clearance prior to her undergoing the above procedure. Patient is a Former Smoker (30 pack years; quit 02/2009). Pertinent PMH includes: HFpEF (G1DD), aortic atherosclerosis, bilateral common iliac artery stenosis, HTN, hypothyroidism, COPD, GERD (daily H2 blocker), polymyalgia rheumatica.  Patient is followed by cardiology (End, MD). She was last seen in the cardiology clinic on 02/25/2020; notes reviewed.  At the time of her clinic visit, patient doing well overall from a cardiovascular perspective.  She denied any episodes of chest pain.  Patient with chronic exertional dyspnea at baseline due to her underlying COPD diagnosis.  No PND, orthopnea, palpitations, significant peripheral edema, vertiginous symptoms, or  presyncope/syncope.  PMH significant for HFpEF.    Last TTE performed on 02/16/2018 revealed normal left ventricular systolic function with an EF of 60 to 65%.  Myocardial perfusion imaging study performed on 11/26/2018 revealed no evidence of significant ischemia.  LVEF normal at 56%.  There was no significant coronary artery calcification, however aortic atherosclerosis noted on the attenuation correction CT.  There was a small perfusion abnormality likely consistent with artifact, however subtle scar cannot be ruled out (see full interpretation of cardiovascular test below).  Patient on GDMT for her HTN diagnosis.  Blood pressure well controlled at 132/70 on currently prescribed ARB monotherapy. Functional capacity, as defined by DASI, is documented as being >/= 4 METS.  No changes were made to patient's medication regimen.  Patient to follow-up with outpatient cardiology in 4 months or sooner if needed.  Patient is scheduled for an elective carpal tunnel release on 06/22/2020 with Dr. Skip Estimable.  Given patient's past medical history significant for cardiovascular diagnoses, presurgical cardiac clearance was sought by the performing surgeon's office and PAT team. Per cardiology, "based on patient's past medical history and time since his last clinic visit, patient would be at an overall ACCEPTABLE risk for the planned procedure without further cardiovascular testing or intervention at this time". This patient is on daily anticoagulation/antiplatelet therapy.   Patient reports previous perioperative complications with anesthesia in the past. Patient has a PMH (+) for PONV.  Order placed by PAT for patient to receive a prophylactic preoperative dose of aprepitant. In review of the available records, it is noted that patient underwent a general anesthetic course here (ASA III) in 08/2019 without documented complications.   Vitals with BMI 06/17/2020 02/25/2020 10/27/2019  Height 4\' 9"  4\' 9"  4\' 9"    Weight 177 lbs 176 lbs 183  lbs  BMI 38.29 74.25 95.63  Systolic 875 643 329  Diastolic 55 70 76  Pulse 77 87 74    Providers/Specialists:   NOTE: Primary physician provider listed below. Patient may have been seen by APP or partner within same practice.   PROVIDER ROLE / SPECIALTY LAST OV  Hooten, Laurice Record, MD  Orthopedics (Surgeon) 06/17/2020  Pleas Koch, NP  Primary Care Provider 10/27/2019  Nelva Bush, MD  Cardiology 02/25/2020   Allergies:  Epinephrine, Food, Mangifera indica, Prednisone, Milk-related compounds, and Prevnar 13 [pneumococcal 13-val conj vacc]  Current Home Medications:   No current facility-administered medications for this encounter.    albuterol (PROVENTIL HFA;VENTOLIN HFA) 108 (90 Base) MCG/ACT inhaler   alendronate (FOSAMAX) 70 MG tablet   ALPRAZolam (XANAX) 0.25 MG tablet   Calcium Carb-Cholecalciferol (CALCIUM + VITAMIN D3 PO)   famotidine (PEPCID) 20 MG tablet   hydroxychloroquine (PLAQUENIL) 200 MG tablet   levothyroxine (SYNTHROID) 75 MCG tablet   losartan (COZAAR) 100 MG tablet   Multiple Vitamins-Minerals (PRESERVISION AREDS 2 PO)   famciclovir (FAMVIR) 125 MG tablet   famotidine (PEPCID) 20 MG tablet   Vitamin D, Ergocalciferol, (DRISDOL) 1.25 MG (50000 UNIT) CAPS capsule   History:   Past Medical History:  Diagnosis Date   (HFpEF) heart failure with preserved ejection fraction (HCC)    Anxiety    Aortic atherosclerosis (HCC)    CAP (community acquired pneumonia)    COPD (chronic obstructive pulmonary disease) (HCC)    Dyspnea    DUE TO HEAT WITH COPD   Epistaxis 01/09/2017   GERD (gastroesophageal reflux disease)    OCC-NO MEDS   Grade I diastolic dysfunction    Hypertension    Hypothyroidism    Iliac artery stenosis, bilateral (HCC)    New onset of headaches    Osteopenia    PMR (polymyalgia rheumatica) (HCC)    Pneumonia 02/13/2018   PONV (postoperative nausea and vomiting)    VERY NAUSEATED AFTER SURGERY    Shingles    Stasis dermatitis    Strain of chest wall 01/20/2011   Tobacco abuse    Past Surgical History:  Procedure Laterality Date   ABDOMINAL HYSTERECTOMY     APPENDECTOMY     BACK SURGERY     LUMBAR FUSION   CARPAL TUNNEL RELEASE Left    CARPAL TUNNEL RELEASE Left 08/31/2019   Procedure: CARPAL TUNNEL RELEASE;  Surgeon: Dereck Leep, MD;  Location: [facility] ORS;  Service: Orthopedics;  Laterality: Left;   CERVICAL FUSION     CHOLECYSTECTOMY     HEMORRHOID SURGERY     LUMBAR FUSION     TUBAL LIGATION     Family History  Problem Relation Age of Onset   Heart attack Mother 46   Cancer Father        lung/chest wall    Prostate cancer Father    Diabetes Brother        siblings   Hypertension Sister        x 2   Heart disease Brother    Breast cancer Paternal Grandmother    Heart disease Sister    Diabetes Sister    Tuberculosis Sister    Cirrhosis Brother    Social History   Tobacco Use   Smoking status: Former    Packs/day: 1.50    Years: 20.00    Pack years: 30.00    Types: Cigarettes    Quit date: 02/11/2009    Years since quitting: 42.3  Smokeless tobacco: Never  Vaping Use   Vaping Use: Never used  Substance Use Topics   Alcohol use: No    Alcohol/week: 0.0 standard drinks   Drug use: No    Pertinent Clinical Results:  LABS: Labs reviewed: Acceptable for surgery.  Hospital Outpatient Visit on 06/17/2020  Component Date Value Ref Range Status   WBC 06/17/2020 10.0  4.0 - 10.5 K/uL Final   RBC 06/17/2020 4.48  3.87 - 5.11 MIL/uL Final   Hemoglobin 06/17/2020 13.6  12.0 - 15.0 g/dL Final   HCT 06/17/2020 41.8  36.0 - 46.0 % Final   MCV 06/17/2020 93.3  80.0 - 100.0 fL Final   MCH 06/17/2020 30.4  26.0 - 34.0 pg Final   MCHC 06/17/2020 32.5  30.0 - 36.0 g/dL Final   RDW 06/17/2020 13.5  11.5 - 15.5 % Final   Platelets 06/17/2020 271  150 - 400 K/uL Final   nRBC 06/17/2020 0.0  0.0 - 0.2 % Final   Performed at [facility], [place], [place] [address]   Sodium 06/17/2020 139  135 - 145 mmol/L Final   Potassium 06/17/2020 3.9  3.5 - 5.1 mmol/L Final   Chloride 06/17/2020 104  98 - 111 mmol/L Final   CO2 06/17/2020 28  22 - 32 mmol/L Final   Glucose, Bld 06/17/2020 134 (A) 70 - 99 mg/dL Final   Glucose reference range applies only to samples taken after fasting for at least 8 hours.   BUN 06/17/2020 20  8 - 23 mg/dL Final   Creatinine, Ser 06/17/2020 1.07 (A) 0.44 - 1.00 mg/dL Final   Calcium 06/17/2020 8.9  8.9 - 10.3 mg/dL Final   GFR, Estimated 06/17/2020 54 (A) >60 mL/min Final   Comment: (NOTE) Calculated using the CKD-EPI Creatinine Equation (2021)    Anion gap 06/17/2020 7  5 - 15 Final   Performed at [facility], [address] [place], [place] [address]    ECG: Date: 06/17/2020 Time ECG obtained: 0909 AM Rate: 94 bpm Rhythm: normal sinus Axis (leads I and aVF): Normal Intervals: PR 168 ms. QRS 90 ms. QTc 462 ms. ST segment and T wave changes: Nonspecific ST and T wave abnormalities  Comparison: Similar to previous tracing obtained on 02/25/2020   IMAGING / PROCEDURES: LEXISCAN performed 11/26/2018 Small in size, mild in severity, fixed defect involving the apical anterior and apical segments.  This is most consistent with artifact (attenuation and apical thinning), but cannot rule out subtle scar. There is no significant ischemia The left ventricular ejection fraction is normal at 56% There is no significant coronary artery calcification Aortic atherosclerosis is noted on the attenuation correction CT This is a low risk study  TRANSTHORACIC ECHOCARDIOGRAM performed on 02/16/2018 Left ventricular systolic function normal with an ejection fraction of 60 to 65% The left ventricular cavity size is normal Left ventricular Doppler parameters consistent with impaired relaxation right ventricular systolic function normal right ventricular cavity size normal with no increased wall  thickness Right atrial pressure is estimated at 10 mmHg Unable to estimate RVSP Mild mitral valve regurgitation No evidence of valvular stenosis  CAROTID DOPPLER performed on 11/01/2017 Right Carotid:  Velocities in the right ICA are consistent with a 1-39% stenosis.  Non-hemodynamically significant plaque <50% noted in the CCA.  The  ECA appears <50% stenosed.  Stable velocities compared to previous exam.  Left Carotid:  Velocities in the left ICA are consistent with a 1-39% stenosis.  Non-hemodynamically significant  plaque noted in the CCA.  The ECA appears <50% stenosed.  Stable velocities compared to previous exam.  Vertebrals:   Bilateral vertebral arteries demonstrate antegrade flow.  Subclavians:  Normal flow hemodynamics were seen in bilateral subclavian arteries.   Impression and Plan:  Teresa Jensen has been referred for pre-anesthesia review and clearance prior to her undergoing the planned anesthetic and procedural courses. Available labs, pertinent testing, and imaging results were personally reviewed by me. This patient has been appropriately cleared by cardiology with an overall ACCEPTABLE risk of significant perioperative cardiovascular complications.  Based on clinical review performed today (06/17/20), barring any significant acute changes in the patient's overall condition, it is anticipated that she will be able to proceed with the planned surgical intervention. Any acute changes in clinical condition may necessitate her procedure being postponed and/or cancelled. Patient will meet with anesthesia team (MD and/or CRNA) on the day of her procedure for preoperative evaluation/assessment. Questions regarding anesthetic course will be fielded at that time.   Pre-surgical instructions were reviewed with the patient during her PAT appointment and questions were fielded by PAT clinical staff. Patient was advised that if any questions or concerns arise prior to her procedure  then she should return a call to PAT and/or her surgeon's office to discuss.  Honor Loh, MSN, APRN, FNP-C, CEN [facility]  Peri-operative Services Nurse Practitioner Phone: [phone] Fax: [phone] 06/17/20 4:21 PM  NOTE: This note has been prepared using Dragon dictation software. Despite my best ability to proofread, there is always the potential that unintentional transcriptional errors may still occur from this process.

## 2020-06-19 NOTE — H&P (Signed)
ORTHOPAEDIC HISTORY & PHYSICAL [name], [place] - 06/17/2020 8:15 AM EDT Formatting of this note is different from the original. [facility] Chief Complaint:   Chief Complaint  Patient presents with   Wrist Pain  H & P RIGHT WRIST   History of Present Illness:   Teresa Jensen is a 75 y.o. female that presents to clinic today for her preoperative history and evaluation. Patient presents unaccompanied. The patient is scheduled to undergo a right carpal tunnel release on 06/22/20 by Dr. Marry Guan. The patient reports a long history of numbness and tingling in the right hand. Patient denies any history of injury to the hand.   The patient's symptoms have progressed to the point that they decrease her quality of life. The patient has previously undergone conservative treatment including NSAIDS, splinting, and corticosteroid without adequate control of her symptoms.  Denies significant drug allergies.   Past Medical, Surgical, Family, Social History, Allergies, Medications:   Past Medical History:  Past Medical History:  Diagnosis Date   Chicken pox   Past Surgical History:  Past Surgical History:  Procedure Laterality Date   CARPAL TUNNEL RELEASE Left  approximately 1980   HYSTERECTOMY   LAPAROSCOPIC CHOLECYSTECTOMY   Left carpal tunnel release 08/31/2019  Dr Marry Guan   SPINE SURGERY   Current Medications:  Current Outpatient Medications  Medication Sig Dispense Refill   alendronate (FOSAMAX) 70 MG tablet Take 70 mg by mouth every 7 (seven) days   ALPRAZolam (XANAX) 0.25 MG tablet 0.25 mg nightly as needed   CALCIUM CARBONATE-VITAMIN D3 ORAL Take 1 tablet by mouth once daily   carboxymethylcellulose-glycerin (REFRESH RELIEVA) 0.5-0.9 % ophthalmic solution Place 1 drop into both eyes as needed for Dry Eyes   famotidine (PEPCID) 20 MG tablet TAKE 1 TABLET (20 MG TOTAL) BY MOUTH AT BEDTIME. FOR HEARTBURN.   gentamicin (GARAMYCIN)  0.1 % ointment APPLY TO NOSE THREE TIMES DAILY AS DIRECTED   hydrOXYchloroQUINE (PLAQUENIL) 200 mg tablet Take 200 mg by mouth 2 (two) times daily   levothyroxine (SYNTHROID, LEVOTHROID) 75 MCG tablet Take 75 mcg by mouth once daily 3   triamcinolone 0.1 % ointment Apply 1 Application topically as directed   vit C/E/Zn/coppr/lutein/zeaxan (PRESERVISION AREDS-2 ORAL) Take 1 tablet by mouth 2 (two) times daily   losartan (COZAAR) 100 MG tablet Take 100 mg by mouth once daily   No current facility-administered medications for this visit.   Allergies:  Allergies  Allergen Reactions   Epinephrine Anaphylaxis and Shortness Of Breath   Prednisone Anaphylaxis, Hives, Shortness Of Breath, Itching and Swelling    The face swells HAS TAKEN CORTISONE SHOTS WITHOUT ISSUE BEFORE   Milk Containing Products Diarrhea   Mango Rash   Pneumococcal 13-Valent Conjugate To Diphtheria Crm Itching   Social History:  Social History   Socioeconomic History   Marital status: Married  Spouse name: [name]   Number of children: 1   Years of education: 12   Highest education level: High school graduate  Occupational History   Occupation: Retired- Customer service manager  Tobacco Use   Smoking status: Former Smoker  Packs/day: 1.00  Years: 30.00  Pack years: 30.00  Quit date: 2014  Years since quitting: 8.4   Smokeless tobacco: Former Counsellor Use: Never used  Substance and Sexual Activity   Alcohol use: Not Currently   Drug use: Never   Sexual activity: Defer  Partners: Male   Family History:  Family  History  Problem Relation Age of Onset   No Known Problems Mother   No Known Problems Father   No Known Problems Sister   No Known Problems Brother   Review of Systems:   A 10+ ROS was performed, reviewed, and the pertinent orthopaedic findings are documented in the HPI.   Physical Examination:   BP (!) 150/80 (BP Location: Left upper arm, Patient Position: Sitting, BP Cuff  Size: Large Adult)  Ht 146.1 cm (4' 9.5")  Wt 80.4 kg (177 lb 3.2 oz)  BMI 37.68 kg/m   Patient is a well-developed, well-nourished female in no acute distress. Patient has normal mood and affect. Patient is alert and oriented to person, place, and time.   HEENT: Atraumatic, normocephalic. Pupils equal and reactive to light. Extraocular motion intact. Noninjected sclera.  Cardiovascular: Regular rate and rhythm, with no murmurs, rubs, or gallops. Distal pulses palpable.  Respiratory: Minimal faint crackles heard in left lower lobe, otherwise clear to auscultation bilaterally.   Right hand:  Tenderness: Negative Erythema: Negative Swelling: Negative Capillary Refill: Normal Thenar atrophy: Mild Intrinsic wasting: negative Grip strength: fair to good grip strength Pincer strength: fair to good pincer strength Tinel`s test: Negative Phalen`s test: positive Durkan's test: positive Triggering: No gross triggering or locking of the digits Finkelstein`s test: Negative Range of motion: Good range of motion of the digits  Tests Performed/Reviewed:  X-rays  No new radiographs were obtained today.   Impression:   ICD-10-CM  1. Right carpal tunnel syndrome G56.01  2. Severe obesity (BMI 35.0-39.9) with comorbidity (CMS-HCC) E66.01   Plan:   -right carpal tunnel syndrome  The patient will benefit from a right carpal tunnel release. The risks of surgery, including infection and blood clots, were discussed with the patient. Having failed conservative treatment, the patient has elected to proceed with surgery. The risks of surgery, including blood clots and infection, were discussed with the patient. Measures taken to reduce these risks were also discussed with the patient. The postoperative course was discussed with the patient. Patient was instructed to stop all blood thinners prior to surgery. Patient understands and elects to proceed with surgery.  Contact our office with any  questions or concerns. Follow up as indicated, or sooner should any new problems arise, if conditions worsen, or if they are otherwise concerned.   [name], PA-C [facility] [address], [place] [address] Phone: [phone]  This note was generated in part with voice recognition software and I apologize for any typographical errors that were not detected and corrected.  Electronically signed by [name], PA at 06/17/2020 9:00 AM EDT

## 2020-06-20 ENCOUNTER — Other Ambulatory Visit: Payer: Self-pay | Admitting: Family

## 2020-06-20 DIAGNOSIS — I10 Essential (primary) hypertension: Secondary | ICD-10-CM

## 2020-06-21 ENCOUNTER — Telehealth: Payer: Self-pay | Admitting: Dermatology

## 2020-06-21 MED ORDER — HYDROXYCHLOROQUINE SULFATE 200 MG PO TABS: 200.0000 mg | ORAL_TABLET | Freq: Two times a day (BID) | ORAL | 1 refills | Status: DC

## 2020-06-21 MED ORDER — APREPITANT 40 MG PO CAPS
40.0000 mg | ORAL_CAPSULE | Freq: Once | ORAL | Status: AC
  Administered 2020-06-22: 40 mg via ORAL

## 2020-06-21 MED ORDER — CELECOXIB 200 MG PO CAPS: 400.0000 mg | ORAL_CAPSULE | Freq: Once | ORAL | Status: AC

## 2020-06-21 MED ORDER — LACTATED RINGERS IV SOLN: INTRAVENOUS | Status: DC

## 2020-06-21 MED ORDER — ORAL CARE MOUTH RINSE
15.0000 mL | Freq: Once | OROMUCOSAL | Status: AC
Start: 2020-06-21 — End: 2020-06-22

## 2020-06-21 MED ORDER — CHLORHEXIDINE GLUCONATE 0.12 % MT SOLN: 15.0000 mL | Freq: Once | OROMUCOSAL | Status: AC

## 2020-06-21 NOTE — Telephone Encounter (Signed)
Reviewed patient's CBC with diff and BMP, Labs ok. Please send 6 months refills of hydroxychloroquine. Thank you!

## 2020-06-21 NOTE — Addendum Note (Signed)
Addended by: Ruthell Rummage A on: 06/21/2020 05:55 PM   Modules accepted: Orders

## 2020-06-21 NOTE — Telephone Encounter (Signed)
Patient notified of lab results and 6 month supply of medication to patient's pharmacy.

## 2020-06-22 ENCOUNTER — Ambulatory Visit: Payer: Medicare Other | Admitting: Urgent Care

## 2020-06-22 ENCOUNTER — Encounter
Admission: RE | Disposition: A | Discharge status: Home / Self Care | Payer: Self-pay | Source: Home / Self Care | Attending: Orthopedic Surgery

## 2020-06-22 ENCOUNTER — Encounter: Payer: Self-pay | Admitting: Orthopedic Surgery

## 2020-06-22 ENCOUNTER — Ambulatory Visit
Admission: RE | Admit: 2020-06-22 | Discharge: 2020-06-22 | Disposition: A | Discharge status: Home / Self Care | Payer: Medicare Other | Attending: Orthopedic Surgery | Admitting: Orthopedic Surgery

## 2020-06-22 DIAGNOSIS — Z79890 Hormone replacement therapy (postmenopausal): Secondary | ICD-10-CM | POA: Insufficient documentation

## 2020-06-22 DIAGNOSIS — I10 Essential (primary) hypertension: Secondary | ICD-10-CM

## 2020-06-22 DIAGNOSIS — Z87891 Personal history of nicotine dependence: Secondary | ICD-10-CM | POA: Insufficient documentation

## 2020-06-22 DIAGNOSIS — I110 Hypertensive heart disease with heart failure: Secondary | ICD-10-CM | POA: Diagnosis not present

## 2020-06-22 DIAGNOSIS — J449 Chronic obstructive pulmonary disease, unspecified: Secondary | ICD-10-CM | POA: Diagnosis not present

## 2020-06-22 DIAGNOSIS — I5032 Chronic diastolic (congestive) heart failure: Secondary | ICD-10-CM | POA: Diagnosis not present

## 2020-06-22 DIAGNOSIS — Z7983 Long term (current) use of bisphosphonates: Secondary | ICD-10-CM | POA: Insufficient documentation

## 2020-06-22 DIAGNOSIS — E039 Hypothyroidism, unspecified: Secondary | ICD-10-CM | POA: Insufficient documentation

## 2020-06-22 DIAGNOSIS — Z888 Allergy status to other drugs, medicaments and biological substances status: Secondary | ICD-10-CM | POA: Diagnosis not present

## 2020-06-22 DIAGNOSIS — M353 Polymyalgia rheumatica: Secondary | ICD-10-CM | POA: Insufficient documentation

## 2020-06-22 DIAGNOSIS — E038 Other specified hypothyroidism: Secondary | ICD-10-CM

## 2020-06-22 DIAGNOSIS — M810 Age-related osteoporosis without current pathological fracture: Secondary | ICD-10-CM

## 2020-06-22 DIAGNOSIS — Z6838 Body mass index (BMI) 38.0-38.9, adult: Secondary | ICD-10-CM | POA: Insufficient documentation

## 2020-06-22 DIAGNOSIS — I700 Atherosclerosis of aorta: Secondary | ICD-10-CM | POA: Diagnosis not present

## 2020-06-22 DIAGNOSIS — Z79899 Other long term (current) drug therapy: Secondary | ICD-10-CM | POA: Insufficient documentation

## 2020-06-22 DIAGNOSIS — G5601 Carpal tunnel syndrome, right upper limb: Secondary | ICD-10-CM | POA: Diagnosis not present

## 2020-06-22 DIAGNOSIS — F418 Other specified anxiety disorders: Secondary | ICD-10-CM

## 2020-06-22 DIAGNOSIS — Z9889 Other specified postprocedural states: Secondary | ICD-10-CM

## 2020-06-22 DIAGNOSIS — I708 Atherosclerosis of other arteries: Secondary | ICD-10-CM | POA: Insufficient documentation

## 2020-06-22 DIAGNOSIS — K219 Gastro-esophageal reflux disease without esophagitis: Secondary | ICD-10-CM | POA: Diagnosis not present

## 2020-06-22 HISTORY — DX: Atherosclerosis of aorta: I70.0

## 2020-06-22 HISTORY — DX: Stricture of artery: I77.1

## 2020-06-22 HISTORY — DX: Other ill-defined heart diseases: I51.89

## 2020-06-22 HISTORY — DX: Unspecified diastolic (congestive) heart failure: I50.30

## 2020-06-22 HISTORY — PX: CARPAL TUNNEL RELEASE: SHX101

## 2020-06-22 SURGERY — CARPAL TUNNEL RELEASE
Anesthesia: General | Site: Wrist | Laterality: Right

## 2020-06-22 MED ORDER — ONDANSETRON HCL 4 MG/2ML IJ SOLN
INTRAMUSCULAR | Status: AC
  Filled 2020-06-22: qty 2

## 2020-06-22 MED ORDER — ONDANSETRON HCL 4 MG/2ML IJ SOLN
INTRAMUSCULAR | Status: DC | PRN
  Administered 2020-06-22: 4 mg via INTRAVENOUS

## 2020-06-22 MED ORDER — FENTANYL CITRATE (PF) 100 MCG/2ML IJ SOLN
INTRAMUSCULAR | Status: DC | PRN
  Administered 2020-06-22: 50 ug via INTRAVENOUS

## 2020-06-22 MED ORDER — CELECOXIB 200 MG PO CAPS
ORAL_CAPSULE | ORAL | Status: AC
  Administered 2020-06-22: 400 mg via ORAL
  Filled 2020-06-22: qty 2

## 2020-06-22 MED ORDER — ONDANSETRON HCL 4 MG PO TABS
4.0000 mg | ORAL_TABLET | Freq: Four times a day (QID) | ORAL | Status: DC | PRN
  Administered 2020-06-22: 4 mg via ORAL

## 2020-06-22 MED ORDER — NEOMYCIN-POLYMYXIN B GU 40-200000 IR SOLN
Status: DC | PRN
  Administered 2020-06-22: 2 mL

## 2020-06-22 MED ORDER — BUPIVACAINE HCL (PF) 0.25 % IJ SOLN
INTRAMUSCULAR | Status: AC
  Filled 2020-06-22: qty 30

## 2020-06-22 MED ORDER — DEXAMETHASONE SODIUM PHOSPHATE 10 MG/ML IJ SOLN
INTRAMUSCULAR | Status: AC
  Filled 2020-06-22: qty 1

## 2020-06-22 MED ORDER — ACETAMINOPHEN 10 MG/ML IV SOLN
INTRAVENOUS | Status: DC | PRN
  Administered 2020-06-22: 1000 mg via INTRAVENOUS

## 2020-06-22 MED ORDER — METOCLOPRAMIDE HCL 10 MG PO TABS
5.0000 mg | ORAL_TABLET | Freq: Three times a day (TID) | ORAL | Status: DC | PRN
Start: 2020-06-22 — End: 2020-06-22

## 2020-06-22 MED ORDER — BUPIVACAINE HCL (PF) 0.25 % IJ SOLN
INTRAMUSCULAR | Status: DC | PRN
  Administered 2020-06-22: 10 mL

## 2020-06-22 MED ORDER — LIDOCAINE HCL (CARDIAC) PF 100 MG/5ML IV SOSY
PREFILLED_SYRINGE | INTRAVENOUS | Status: DC | PRN
  Administered 2020-06-22: 100 mg via INTRAVENOUS

## 2020-06-22 MED ORDER — FENTANYL CITRATE (PF) 100 MCG/2ML IJ SOLN: 25.0000 ug | INTRAMUSCULAR | Status: DC | PRN

## 2020-06-22 MED ORDER — NEOMYCIN-POLYMYXIN B GU 40-200000 IR SOLN
Status: AC
  Filled 2020-06-22: qty 2

## 2020-06-22 MED ORDER — FENTANYL CITRATE (PF) 100 MCG/2ML IJ SOLN
INTRAMUSCULAR | Status: AC
  Filled 2020-06-22: qty 2

## 2020-06-22 MED ORDER — PROPOFOL 10 MG/ML IV BOLUS
INTRAVENOUS | Status: DC | PRN
  Administered 2020-06-22: 120 mg via INTRAVENOUS

## 2020-06-22 MED ORDER — ALENDRONATE SODIUM 70 MG PO TABS: 70.0000 mg | ORAL_TABLET | ORAL | 2 refills | Status: DC

## 2020-06-22 MED ORDER — GLYCOPYRROLATE 0.2 MG/ML IJ SOLN
INTRAMUSCULAR | Status: DC | PRN
  Administered 2020-06-22: .2 mg via INTRAVENOUS

## 2020-06-22 MED ORDER — PHENYLEPHRINE HCL (PRESSORS) 10 MG/ML IV SOLN
INTRAVENOUS | Status: DC | PRN
  Administered 2020-06-22 (×2): 100 ug via INTRAVENOUS

## 2020-06-22 MED ORDER — METOCLOPRAMIDE HCL 5 MG/ML IJ SOLN: 5.0000 mg | Freq: Three times a day (TID) | INTRAMUSCULAR | Status: DC | PRN

## 2020-06-22 MED ORDER — ACETAMINOPHEN 10 MG/ML IV SOLN
INTRAVENOUS | Status: AC
  Filled 2020-06-22: qty 100

## 2020-06-22 MED ORDER — GLYCOPYRROLATE 0.2 MG/ML IJ SOLN
INTRAMUSCULAR | Status: AC
  Filled 2020-06-22: qty 1

## 2020-06-22 MED ORDER — ONDANSETRON HCL 4 MG/2ML IJ SOLN: 4.0000 mg | Freq: Four times a day (QID) | INTRAMUSCULAR | Status: DC | PRN

## 2020-06-22 MED ORDER — APREPITANT 40 MG PO CAPS
ORAL_CAPSULE | ORAL | Status: AC
  Filled 2020-06-22: qty 1

## 2020-06-22 MED ORDER — DEXAMETHASONE SODIUM PHOSPHATE 10 MG/ML IJ SOLN
INTRAMUSCULAR | Status: DC | PRN
  Administered 2020-06-22: 10 mg via INTRAVENOUS

## 2020-06-22 MED ORDER — HYDROCODONE-ACETAMINOPHEN 5-325 MG PO TABS: 1.0000 | ORAL_TABLET | ORAL | 0 refills | Status: DC | PRN

## 2020-06-22 MED ORDER — METOCLOPRAMIDE HCL 5 MG/ML IJ SOLN
INTRAMUSCULAR | Status: AC
  Administered 2020-06-22: 10 mg via INTRAVENOUS
  Filled 2020-06-22: qty 2

## 2020-06-22 MED ORDER — LEVOTHYROXINE SODIUM 75 MCG PO TABS: 75.0000 ug | ORAL_TABLET | Freq: Every day | ORAL | Status: DC

## 2020-06-22 MED ORDER — ALPRAZOLAM 0.25 MG PO TABS: 0.1250 mg | ORAL_TABLET | Freq: Every day | ORAL | Status: DC | PRN

## 2020-06-22 MED ORDER — LIDOCAINE HCL (PF) 2 % IJ SOLN
INTRAMUSCULAR | Status: AC
  Filled 2020-06-22: qty 5

## 2020-06-22 MED ORDER — CHLORHEXIDINE GLUCONATE 0.12 % MT SOLN
OROMUCOSAL | Status: AC
  Administered 2020-06-22: 15 mL via OROMUCOSAL
  Filled 2020-06-22: qty 15

## 2020-06-22 SURGICAL SUPPLY — 29 items
BNDG CMPR STD VLCR NS LF 5.8X3 (GAUZE/BANDAGES/DRESSINGS) ×1
BNDG ELASTIC 3X5.8 VLCR NS LF (GAUZE/BANDAGES/DRESSINGS) ×2 IMPLANT
BNDG ESMARK 4X12 TAN STRL LF (GAUZE/BANDAGES/DRESSINGS) ×2 IMPLANT
CANISTER SUCT 1200ML W/VALVE (MISCELLANEOUS) ×2 IMPLANT
CAST PADDING 3X4FT ST 30246 (SOFTGOODS) ×1
COVER WAND RF STERILE (DRAPES) ×2 IMPLANT
CUFF TOURN SGL QUICK 18X4 (TOURNIQUET CUFF) ×2 IMPLANT
DRSG DERMACEA 8X12 NADH (GAUZE/BANDAGES/DRESSINGS) ×2 IMPLANT
DURAPREP 26ML APPLICATOR (WOUND CARE) ×2 IMPLANT
ELECT CAUTERY BLADE 6.4 (BLADE) ×2 IMPLANT
ELECT REM PT RETURN 9FT ADLT (ELECTROSURGICAL) ×2
ELECTRODE REM PT RTRN 9FT ADLT (ELECTROSURGICAL) ×1 IMPLANT
GAUZE SPONGE 4X4 12PLY STRL (GAUZE/BANDAGES/DRESSINGS) ×2 IMPLANT
GLOVE SURG ENC TEXT LTX SZ7.5 (GLOVE) ×2 IMPLANT
GLOVE SURG UNDER LTX SZ8 (GLOVE) ×2 IMPLANT
GOWN STRL REUS W/ TWL LRG LVL3 (GOWN DISPOSABLE) ×2 IMPLANT
GOWN STRL REUS W/TWL LRG LVL3 (GOWN DISPOSABLE) ×4
KIT TURNOVER KIT A (KITS) ×2 IMPLANT
MANIFOLD NEPTUNE II (INSTRUMENTS) ×2 IMPLANT
NS IRRIG 500ML POUR BTL (IV SOLUTION) ×2 IMPLANT
PACK EXTREMITY ARMC (MISCELLANEOUS) ×2 IMPLANT
PAD CAST CTTN 3X4 STRL (SOFTGOODS) ×1 IMPLANT
PADDING CAST COTTON 3X4 STRL (SOFTGOODS) ×1
SOL PREP PVP 2OZ (MISCELLANEOUS) ×2
SOLUTION PREP PVP 2OZ (MISCELLANEOUS) ×1 IMPLANT
SPLINT CAST 1 STEP 3X12 (MISCELLANEOUS) ×2 IMPLANT
STOCKINETTE 48X4 2 PLY STRL (GAUZE/BANDAGES/DRESSINGS) ×1 IMPLANT
STOCKINETTE STRL 4IN 9604848 (GAUZE/BANDAGES/DRESSINGS) ×2 IMPLANT
SUT ETHILON 5-0 FS-2 18 BLK (SUTURE) ×2 IMPLANT

## 2020-06-22 NOTE — Transfer of Care (Signed)
Immediate Anesthesia Transfer of Care Note  Patient: Teresa Jensen  Procedure(s) Performed: CARPAL TUNNEL RELEASE (Right: Wrist)  Patient Location: PACU  Anesthesia Type:General  Level of Consciousness: awake, alert  and oriented  Airway & Oxygen Therapy: Patient Spontanous Breathing  Post-op Assessment: Report given to RN and Post -op Vital signs reviewed and stable  Post vital signs: Reviewed and stable  Last Vitals:  Vitals Value Taken Time  BP 107/64   Temp    Pulse 89 06/22/20 1335  Resp 13 06/22/20 1335  SpO2 100 % 06/22/20 1335  Vitals shown include unvalidated device data.  Last Pain:  Vitals:   06/22/20 0948  PainSc: 0-No pain         Complications: No notable events documented.

## 2020-06-22 NOTE — Anesthesia Procedure Notes (Signed)
Procedure Name: LMA Insertion Date/Time: 06/22/2020 12:18 PM Performed by: Aline Brochure, CRNA Pre-anesthesia Checklist: Patient identified, Patient being monitored, Timeout performed, Emergency Drugs available and Suction available Patient Re-evaluated:Patient Re-evaluated prior to induction Oxygen Delivery Method: Circle system utilized Preoxygenation: Pre-oxygenation with 100% oxygen Induction Type: IV induction Ventilation: Mask ventilation without difficulty LMA: LMA inserted LMA Size: 3.0 Tube type: Oral Number of attempts: 1 Placement Confirmation: positive ETCO2 and breath sounds checked- equal and bilateral Tube secured with: Tape Dental Injury: Teeth and Oropharynx as per pre-operative assessment

## 2020-06-22 NOTE — Anesthesia Preprocedure Evaluation (Signed)
Anesthesia Evaluation  Patient identified by MRN, date of birth, ID band Patient awake    Reviewed: Allergy & Precautions, NPO status , Patient's Chart, lab work & pertinent test results  History of Anesthesia Complications (+) PONV and history of anesthetic complications  Airway Mallampati: I  TM Distance: >3 FB Neck ROM: Full    Dental no notable dental hx. (+) Teeth Intact, Dental Advisory Given   Pulmonary shortness of breath and with exertion, neg sleep apnea, COPD, neg recent URI, Patient abstained from smoking.Not current smoker, former smoker,  Has not needed inhalers in 6 months, never hospitalized for COPD, not on home O2   Pulmonary exam normal breath sounds clear to auscultation       Cardiovascular Exercise Tolerance: Good METShypertension, (-) angina+ Peripheral Vascular Disease, +CHF and + DOE  (-) CAD and (-) Past MI (-) dysrhythmias  Rhythm:Regular Rate:Normal - Systolic murmurs TTE 6144: 1. The left ventricle has normal systolic function with an ejection  fraction of 60-65%. The cavity size was normal. Left ventricular diastolic  Doppler parameters are consistent with impaired relaxation.  2. The right ventricle has normal systolic function. The cavity was  normal. There is no increase in right ventricular wall thickness.  3. Right atrial pressure is estimated at 10 mmHg.  4. Unable to estimate RVSP    Neuro/Psych  Headaches, PSYCHIATRIC DISORDERS Anxiety    GI/Hepatic GERD  Controlled,(+)     (-) substance abuse  ,   Endo/Other  neg diabetesMorbid obesity  Renal/GU negative Renal ROS     Musculoskeletal   Abdominal (+) + obese,   Peds  Hematology   Anesthesia Other Findings Past Medical History: No date: Anxiety No date: CAP (community acquired pneumonia) No date: COPD (chronic obstructive pulmonary disease) (HCC) No date: Dyspnea     Comment:  DUE TO HEAT WITH COPD 01/09/2017:  Epistaxis No date: GERD (gastroesophageal reflux disease)     Comment:  OCC-NO MEDS No date: Hypertension No date: Hypothyroidism No date: New onset of headaches No date: Osteopenia No date: PMR (polymyalgia rheumatica) ([place]) 02/13/2018: Pneumonia No date: PONV (postoperative nausea and vomiting)     Comment:  VERY NAUSEATED AFTER SURGERY No date: Stasis dermatitis 01/20/2011: Strain of chest wall No date: Tobacco abuse  Reproductive/Obstetrics                             Anesthesia Physical  Anesthesia Plan  ASA: 3  Anesthesia Plan: General   Post-op Pain Management:    Induction: Intravenous  PONV Risk Score and Plan: 4 or greater and Ondansetron, Dexamethasone and Treatment may vary due to age or medical condition  Airway Management Planned: LMA  Additional Equipment: None  Intra-op Plan:   Post-operative Plan: Extubation in OR  Informed Consent: I have reviewed the patients History and Physical, chart, labs and discussed the procedure including the risks, benefits and alternatives for the proposed anesthesia with the patient or authorized representative who has indicated his/her understanding and acceptance.     Dental advisory given  Plan Discussed with: CRNA and Surgeon  Anesthesia Plan Comments: (Discussed risks of anesthesia with patient, including PONV, sore throat, lip/dental damage. Rare risks discussed as well, such as cardiorespiratory and neurological sequelae. Patient understands.)        Anesthesia Quick Evaluation

## 2020-06-22 NOTE — Op Note (Signed)
OPERATIVE NOTE  DATE OF SURGERY:  06/22/2020  PATIENT NAME:  Teresa Jensen   DOB: 03-16-1945  MRN: [id number]  PRE-OPERATIVE DIAGNOSIS: Right carpal tunnel syndrome  POST-OPERATIVE DIAGNOSIS:  Same  PROCEDURE:  Right carpal tunnel release  SURGEON:  Marciano Sequin. M.D.  ANESTHESIA: general  ESTIMATED BLOOD LOSS: Minimal  FLUIDS REPLACED: 400 mL of crystalloid  TOURNIQUET TIME: 21 minutes  DRAINS: None  INDICATIONS FOR SURGERY: [name] is a 75 y.o. year old female with a long history of numbness and paresthesias to the right hand. EMG/nerve conduction studies demonstrated findings consistent with carpal tunnel syndrome.The patient had not seen any significant improvement despite conservative nonsurgical intervention. After discussion of the risks and benefits of surgical intervention, the patient expressed understanding of the risks benefits and agree with plans for carpal tunnel release.   PROCEDURE IN DETAIL: The patient was brought into the operating room and after adequate general anesthesia, a tourniquet was placed on the patient's right upper arm.The right hand and arm were prepped with alcohol and Duraprep and draped in the usual sterile fashion. A "time-out" was performed as per usual protocol. The hand and forearm were exsanguinated using an Esmarch and the tourniquet was inflated to 250 mmHg. Loupe magnification was used throughout the procedure. An incision was made just ulnar to the thenar palmar crease. Dissection was carried down through the palmar fascia to the transverse carpal ligament. The transverse carpal ligament was sharply incised, taking care to protect the underlying structures with the carpal tunnel. Complete release of the transverse carpal ligament was achieved. There was no evidence of ganglion cyst or lipoma within the carpal tunnel. The wound was irrigated with copious amounts of normal saline with antibiotic solution. The skin was then  re-approximated with interrupted sutures of #5-0 nylon. A sterile dressing was applied followed by application of a volar splint. The tourniquet was deflated with a total tourniquet time of 21 minutes.  The patient tolerated the procedure well and was transported to the PACU in stable condition.  Malone Vanblarcom P. Holley Bouche., M.D.

## 2020-06-22 NOTE — H&P (Signed)
The patient has been re-examined, and the chart reviewed, and there have been no interval changes to the documented history and physical.    The risks, benefits, and alternatives have been discussed at length. The patient expressed understanding of the risks benefits and agreed with plans for surgical intervention.  Teresa Jensen, Jr. M.D.    

## 2020-06-23 ENCOUNTER — Encounter: Payer: Self-pay | Admitting: Orthopedic Surgery

## 2020-06-23 NOTE — Anesthesia Postprocedure Evaluation (Signed)
Anesthesia Post Note  Patient: Teresa Jensen  Procedure(s) Performed: CARPAL TUNNEL RELEASE (Right: Wrist)  Patient location during evaluation: PACU Anesthesia Type: General Level of consciousness: awake and alert Pain management: pain level controlled Vital Signs Assessment: post-procedure vital signs reviewed and stable Respiratory status: spontaneous breathing, nonlabored ventilation, respiratory function stable and patient connected to nasal cannula oxygen Cardiovascular status: blood pressure returned to baseline and stable Postop Assessment: no apparent nausea or vomiting Anesthetic complications: no   No notable events documented.   Last Vitals:  Vitals:   06/22/20 1400 06/22/20 1420  BP: (!) 147/53 (!) 155/58  Pulse: 85 80  Resp: 15 14  Temp: (!) 36.4 C   SpO2: 95% 94%    Last Pain:  Vitals:   06/22/20 1420  PainSc: 0-No pain                 Martha Clan

## 2020-06-26 ENCOUNTER — Other Ambulatory Visit: Payer: Self-pay | Admitting: Family

## 2020-06-26 DIAGNOSIS — I10 Essential (primary) hypertension: Secondary | ICD-10-CM

## 2020-06-28 NOTE — Telephone Encounter (Signed)
Rx(s) sent to pharmacy electronically.  

## 2020-06-29 ENCOUNTER — Encounter: Payer: Self-pay | Admitting: Dermatology

## 2020-07-04 ENCOUNTER — Ambulatory Visit
Admission: RE | Admit: 2020-07-04 | Discharge: 2020-07-04 | Disposition: A | Discharge status: Home / Self Care | Payer: Medicare Other | Source: Ambulatory Visit | Attending: Emergency Medicine | Admitting: Emergency Medicine

## 2020-07-04 ENCOUNTER — Other Ambulatory Visit: Payer: Self-pay

## 2020-07-04 VITALS — BP 141/53 | HR 75 | Temp 97.8°F | Resp 16 | Ht <= 58 in | Wt 177.0 lb

## 2020-07-04 DIAGNOSIS — R21 Rash and other nonspecific skin eruption: Secondary | ICD-10-CM

## 2020-07-04 MED ORDER — TRIAMCINOLONE ACETONIDE 0.1 % EX CREA: 1.0000 "application " | TOPICAL_CREAM | Freq: Two times a day (BID) | CUTANEOUS | 0 refills | Status: DC

## 2020-07-04 NOTE — Discharge Instructions (Addendum)
Use the triamcinolone cream as directed.  Follow up with your primary care provider if your symptoms are not improving.

## 2020-07-04 NOTE — ED Provider Notes (Signed)
Roderic Palau    CSN: [id number] Arrival date & time: 07/04/20  1129      History   Chief Complaint Chief Complaint  Patient presents with   Rash    HPI Teresa Jensen is a 75 y.o. female.  Patient presents with 5 day history of pruritic rash on her right forearm.  She also has itching of her left forearm but no apparent rash.  She had right carpal tunnel release on 06/22/2020 and had a bandage up to her mid forearm which was removed 4 days ago.  She denies fever, chills, numbness, weakness, or other symptoms.  Treatment attempted at home with OTC anti-itch cream.  Her medical history also includes COPD, tobacco abuse, heart failure, atherosclerosis, diastolic dysfunction, hypertension.  The history is provided by the patient and medical records.   Past Medical History:  Diagnosis Date   (HFpEF) heart failure with preserved ejection fraction ([place])    Anxiety    Aortic atherosclerosis ([place])    CAP (community acquired pneumonia)    COPD (chronic obstructive pulmonary disease) ([place])    Dyspnea    DUE TO HEAT WITH COPD   Epistaxis 01/09/2017   GERD (gastroesophageal reflux disease)    OCC-NO MEDS   Grade I diastolic dysfunction    Hypertension    Hypothyroidism    Iliac artery stenosis, bilateral ([place])    New onset of headaches    Osteopenia    PMR (polymyalgia rheumatica) ([place])    Pneumonia 02/13/2018   PONV (postoperative nausea and vomiting)    VERY NAUSEATED AFTER SURGERY   Shingles    Stasis dermatitis    Strain of chest wall 01/20/2011   Tobacco abuse     Patient Active Problem List   Diagnosis Date Noted   Bilateral carpal tunnel syndrome 08/16/2019   Skin mass 04/30/2019   Chronic heart failure with preserved ejection fraction (HFpEF) ([place]) 11/12/2018   DOE (dyspnea on exertion) 11/12/2018   Abnormal electrocardiogram 11/12/2018   Left leg pain 04/17/2018   COPD exacerbation ([place]) 01/21/2018   Osteoporosis 12/16/2017   Back pain 06/06/2017   Low  vitamin B12 level 01/13/2017   Iatrogenic hypotension 01/12/2017   Acute kidney failure ([place]) 01/12/2017   Anemia 01/12/2017   Essential hypertension 01/08/2017   Epistaxis 01/07/2017   Hand numbness 10/22/2016   Right hand weakness 10/22/2016   Closed fracture of metatarsal bone 09/28/2016   Prediabetes 10/17/2015   Herpes zoster 07/08/2015   Pain of right lower extremity 05/20/2015   Medicare annual wellness visit, subsequent 10/12/2014   Occipital neuralgia 10/12/2014   Vitamin D deficiency 10/12/2014   Erythema of lower extremity 09/08/2013   Persistent cough 06/14/2010   COPD (chronic obstructive pulmonary disease) ([place]) 06/05/2010   Hypothyroidism 09/29/2009   Situational anxiety 10/01/2008   GERD 10/01/2008   Osteoarthritis 10/01/2008    Past Surgical History:  Procedure Laterality Date   ABDOMINAL HYSTERECTOMY     APPENDECTOMY     BACK SURGERY     LUMBAR FUSION   CARPAL TUNNEL RELEASE Left    CARPAL TUNNEL RELEASE Left 08/31/2019   Procedure: CARPAL TUNNEL RELEASE;  Surgeon: Dereck Leep, MD;  Location: [facility] ORS;  Service: Orthopedics;  Laterality: Left;   CARPAL TUNNEL RELEASE Right 06/22/2020   Procedure: CARPAL TUNNEL RELEASE;  Surgeon: Dereck Leep, MD;  Location: [facility] ORS;  Service: Orthopedics;  Laterality: Right;   CERVICAL FUSION     CHOLECYSTECTOMY     HEMORRHOID SURGERY  LUMBAR FUSION     TUBAL LIGATION      OB History   No obstetric history on file.      Home Medications    Prior to Admission medications   Medication Sig Start Date End Date Taking? Authorizing Provider  triamcinolone cream (KENALOG) 0.1 % Apply 1 application topically 2 (two) times daily. 07/04/20  Yes Sharion Balloon, NP  albuterol (PROVENTIL HFA;VENTOLIN HFA) 108 (90 Base) MCG/ACT inhaler INHALE 2 PUFFS INTO THE LUNGS EVERY 4 (FOUR) HOURS AS NEEDED FOR SHORTNESS OF BREATH. 02/17/18   Pleas Koch, NP  alendronate (FOSAMAX) 70 MG tablet Take 1 tablet (70 mg total) by  mouth every 7 (seven) days. Take with a full glass of water on an empty stomach. 06/22/20   Hooten, Laurice Record, MD  ALPRAZolam Duanne Moron) 0.25 MG tablet Take 0.5 tablets (0.125 mg total) by mouth daily as needed for anxiety. 06/22/20   Hooten, Laurice Record, MD  Calcium Carb-Cholecalciferol (CALCIUM + VITAMIN D3 PO) Take 1,200 mg by mouth daily.    [provider]  famciclovir (FAMVIR) 125 MG tablet Take 125 mg by mouth daily.    [provider]  famotidine (PEPCID) 20 MG tablet TAKE 1 TABLET (20 MG TOTAL) BY MOUTH AT BEDTIME. FOR HEARTBURN. 04/29/20   Pleas Koch, NP  famotidine (PEPCID) 20 MG tablet Take 20 mg by mouth at bedtime.    [provider]  HYDROcodone-acetaminophen (NORCO) 5-325 MG tablet Take 1-2 tablets by mouth every 4 (four) hours as needed for moderate pain. 06/22/20   Hooten, Laurice Record, MD  hydroxychloroquine (PLAQUENIL) 200 MG tablet Take 1 tablet (200 mg total) by mouth 2 (two) times daily. 06/21/20 12/18/20  Moye, [place], MD  levothyroxine (SYNTHROID) 75 MCG tablet Take 1 tablet (75 mcg total) by mouth daily before breakfast. on an empty stomach with water only.  No food or other medications for 30 minutes. 06/22/20   Hooten, Laurice Record, MD  losartan (COZAAR) 50 MG tablet Take 2 tablets (100 mg total) by mouth daily. 06/28/20   End, Harrell Gave, MD  Multiple Vitamins-Minerals (PRESERVISION AREDS 2 PO) Take 1 tablet by mouth in the morning and at bedtime.    [provider]    Family History Family History  Problem Relation Age of Onset   Heart attack Mother 55   Cancer Father        lung/chest wall    Prostate cancer Father    Diabetes Brother        siblings   Hypertension Sister        x 2   Heart disease Brother    Breast cancer Paternal Grandmother    Heart disease Sister    Diabetes Sister    Tuberculosis Sister    Cirrhosis Brother     Social History Social History   Tobacco Use   Smoking status: Former    Packs/day: 1.50     Years: 20.00    Pack years: 30.00    Types: Cigarettes    Quit date: 02/11/2009    Years since quitting: 11.4   Smokeless tobacco: Never  Vaping Use   Vaping Use: Never used  Substance Use Topics   Alcohol use: No    Alcohol/week: 0.0 standard drinks   Drug use: No     Allergies   Epinephrine, Food, Mangifera indica, Prednisone, Milk-related compounds, and Prevnar 13 [pneumococcal 13-val conj vacc]   Review of Systems Review of Systems  Constitutional:  Negative for  chills and fever.  HENT:  Negative for sore throat.   Respiratory:  Negative for cough and shortness of breath.   Cardiovascular:  Negative for chest pain and palpitations.  Skin:  Positive for rash. Negative for color change.  All other systems reviewed and are negative.   Physical Exam Triage Vital Signs ED Triage Vitals  Enc Vitals Group     BP 07/04/20 1139 (!) 141/53     Pulse Rate 07/04/20 1139 75     Resp 07/04/20 1139 16     Temp 07/04/20 1139 97.8 F (36.6 C)     Temp Source 07/04/20 1139 Oral     SpO2 07/04/20 1139 95 %     Weight 07/04/20 1140 177 lb 0.5 oz (80.3 kg)     Height 07/04/20 1140 4\' 9"  (1.448 m)     Head Circumference --      [place] --      Pain Score 07/04/20 1139 0     Pain Loc --      Pain Edu? --      Excl. in [place]? --    No data found.  Updated Vital Signs BP (!) 141/53   Pulse 75   Temp 97.8 F (36.6 C) (Oral)   Resp 16   Ht 4\' 9"  (1.448 m)   Wt 177 lb 0.5 oz (80.3 kg)   SpO2 95%   BMI 38.31 kg/m   Visual Acuity Right Eye Distance:   Left Eye Distance:   Bilateral Distance:    Right Eye Near:   Left Eye Near:    Bilateral Near:     Physical Exam Vitals and nursing note reviewed.  Constitutional:      General: She is not in acute distress.    Appearance: She is well-developed. She is not ill-appearing.  HENT:     Head: Normocephalic and atraumatic.     Mouth/Throat:     Mouth: Mucous membranes are moist.  Eyes:     Conjunctiva/sclera:  Conjunctivae normal.  Cardiovascular:     Rate and Rhythm: Normal rate and regular rhythm.     Heart sounds: Normal heart sounds.  Pulmonary:     Effort: Pulmonary effort is normal. No respiratory distress.     Breath sounds: Normal breath sounds.  Abdominal:     Palpations: Abdomen is soft.     Tenderness: There is no abdominal tenderness.  Musculoskeletal:     Cervical back: Neck supple.  Skin:    General: Skin is warm and dry.     Capillary Refill: Capillary refill takes less than 2 seconds.     Findings: Rash present.     Comments: Light pink maculopapular rash on right forearm.  No open wounds or drainage.  Neurological:     General: No focal deficit present.     Mental Status: She is alert and oriented to person, place, and time.     Sensory: No sensory deficit.     Motor: No weakness.     Gait: Gait normal.  Psychiatric:        Mood and Affect: Mood normal.        Behavior: Behavior normal.     UC Treatments / Results  Labs (all labs ordered are listed, but only abnormal results are displayed) Labs Reviewed - No data to display  EKG   Radiology No results found.  Procedures Procedures (including critical care time)  Medications Ordered in UC Medications - No data to display  Initial Impression / Assessment and Plan / UC Course  I have reviewed the triage vital signs and the nursing notes.  Pertinent labs & imaging results that were available during my care of the patient were reviewed by me and considered in my medical decision making (see chart for details).  Rash.  Patient is allergic to oral prednisone.  Treating with triamcinolone cream.  Instructed patient to follow-up with her PCP if her symptoms are not improving.  She agrees to plan of care.   Final Clinical Impressions(s) / UC Diagnoses   Final diagnoses:  Rash     Discharge Instructions      Use the triamcinolone cream as directed.  Follow up with your primary care provider if your  symptoms are not improving.         ED Prescriptions     Medication Sig Dispense Auth. Provider   triamcinolone cream (KENALOG) 0.1 % Apply 1 application topically 2 (two) times daily. 30 g Sharion Balloon, NP      PDMP not reviewed this encounter.   Sharion Balloon, NP 07/04/20 1204

## 2020-07-04 NOTE — ED Triage Notes (Signed)
Pt reports having a rash on bila arms since Thursday. Sts she had surgery on R hand on 6/22 and bandage was removed on Friday.

## 2020-07-06 ENCOUNTER — Telehealth: Payer: Self-pay

## 2020-07-06 NOTE — Telephone Encounter (Signed)
I spoke with pt; no difficulty breathing and no swelling in neck, throat,mouth. Ointment has not helped; no available appts at [facility] today and pt said she would keep appt for 07/07/20. UC & ED precautions given and pt voiced understanding. FYI to Gentry Fitz NP.

## 2020-07-06 NOTE — Telephone Encounter (Signed)
[place] Night - Client Nonclinical Telephone Record AccessNurse Client [place] Night - Client Client Site [place] Physician Alma Friendly - NP Contact Type Call Who Is Calling Patient / Member / Family / Caregiver Caller Name Ambriella Kitt Phone Number [phone] Patient Name Teresa Jensen Patient DOB 1945/07/01 Call Type Message Only Information Provided Reason for Call Request to Reschedule Office Appointment Initial Comment Caller is wanting to reschedule her appointment. She would like to come in today to be seen. Patient request to speak to RN No Disp. Time Disposition Final User 07/06/2020 8:11:05 AM General Information Provided Yes Sonny Dandy Call Closed By: Sonny Dandy Transaction Date/Time: 07/06/2020 8:03:07 AM (ET)

## 2020-07-07 ENCOUNTER — Ambulatory Visit (INDEPENDENT_AMBULATORY_CARE_PROVIDER_SITE_OTHER): Payer: Medicare Other | Admitting: Primary Care

## 2020-07-07 ENCOUNTER — Other Ambulatory Visit: Payer: Self-pay

## 2020-07-07 DIAGNOSIS — R21 Rash and other nonspecific skin eruption: Secondary | ICD-10-CM | POA: Insufficient documentation

## 2020-07-07 MED ORDER — CETIRIZINE HCL 10 MG PO TABS: 10.0000 mg | ORAL_TABLET | Freq: Every day | ORAL | 0 refills | Status: DC

## 2020-07-07 NOTE — Patient Instructions (Signed)
Start cetirizine (Zyrtec) 10 mg at bedtime for rash/itching. Take this with your famotidine 20 mg at bedtime.  Please call me with an update next Wednesday.  It was a pleasure to see you today!

## 2020-07-07 NOTE — Assessment & Plan Note (Signed)
Acute for the last week, no improvement with topical steroids.   Unclear etiology, doesn't seem to be secondary to surgery/anesthesia/arm wrap.   Does not appear to be poison ivy, shingles, cellulitis.   Continue famotidine 20 mg HS, add Zyrtec 10 mg HS. She will update in 1-2 weeks.

## 2020-07-07 NOTE — Progress Notes (Signed)
Subjective:    Patient ID: Teresa Jensen, female    DOB: October 12, 1945, 75 y.o.   MRN: [id number]  HPI  Teresa Jensen is a very pleasant 75 y.o. female with a history of hypothyroidism, COPD, CHF, hypertension, prediabetes who presents today to discuss rash.   Her rash is located to the bilateral upper extremities, and left side of her face. She first noticed the rash about one week ago.   She is S/P carpal tunnel release surgery on 06/22/20, was placed in a wrap, rash and itching began around 06/29/20, arm wrap was removed on 07/01/20.  She was treated at urgent care on 07/04/20, prescribed triamcinolone cream and has had no relief. She cannot take prednisone as it causes swelling. She does not take a daily antihistamine, she does take famotidine at night.   No new lotions, detergents, soaps or shampoos. No new medicines, vitamins, supplements. No new pets. No recent outdoor exposure or poison ivy exposure. No bonfire or smoke exposure.  No recent motel or hotel stay or new beds.   No fevers/chills, oral lesions, new joint pains, tick bites, abdominal pain, nausea.      Review of Systems  HENT:  Negative for trouble swallowing.   Respiratory:  Negative for shortness of breath and wheezing.   Skin:  Positive for rash.        Past Medical History:  Diagnosis Date   (HFpEF) heart failure with preserved ejection fraction (H[facility])    Anxiety    Aortic atherosclerosis (H[facility])    CAP (community acquired pneumonia)    COPD (chronic obstructive pulmonary disease) ([facility])    Dyspnea    DUE TO HEAT WITH COPD   Epistaxis 01/09/2017   GERD (gastroesophageal reflux disease)    OCC-NO MEDS   Grade I diastolic dysfunction    Hypertension    Hypothyroidism    Iliac artery stenosis, bilateral (H[facility])    New onset of headaches    Osteopenia    PMR (polymyalgia rheumatica) (HCC)    Pneumonia 02/13/2018   PONV (postoperative nausea and vomiting)    VERY NAUSEATED AFTER SURGERY    Shingles    Stasis dermatitis    Strain of chest wall 01/20/2011   Tobacco abuse     Social History   Socioeconomic History   Marital status: Married    Spouse name: [name]   Number of children: 1   Years of education: Not on file   Highest education level: Not on file  Occupational History   Occupation: Therapist, art as a Therapist, art: RETIRED    Comment: retired  Tobacco Use   Smoking status: Former    Packs/day: 1.50    Years: 20.00    Pack years: 30.00    Types: Cigarettes    Quit date: 02/11/2009    Years since quitting: 11.4   Smokeless tobacco: Never  Vaping Use   Vaping Use: Never used  Substance and Sexual Activity   Alcohol use: No    Alcohol/week: 0.0 standard drinks   Drug use: No   Sexual activity: Not Currently  Other Topics Concern   Not on file  Social History Narrative   Lives in [place] with her husband.  Takes care of her two grandchildren, ages 80 and 2, everyday in [place].  Has one son who is 34.   Desires CPR   Would not want prolonged life support if futile   Social Determinants of Radio broadcast assistant  Strain: Not on file  Food Insecurity: Not on file  Transportation Needs: Not on file  Physical Activity: Not on file  Stress: Not on file  Social Connections: Not on file  Intimate Partner Violence: Not on file    Past Surgical History:  Procedure Laterality Date   [place] Left 08/31/2019   Procedure: CARPAL TUNNEL RELEASE;  Surgeon: Dereck Leep, MD;  Location: [facility] ORS;  Service: Orthopedics;  Laterality: Left;   CARPAL TUNNEL RELEASE Right 06/22/2020   Procedure: CARPAL TUNNEL RELEASE;  Surgeon: Dereck Leep, MD;  Location: [facility] ORS;  Service: Orthopedics;  Laterality: Right;   CERVICAL FUSION     CHOLECYSTECTOMY     HEMORRHOID SURGERY     LUMBAR FUSION     TUBAL LIGATION       Family History  Problem Relation Age of Onset   Heart attack Mother 65   Cancer Father        lung/chest wall    Prostate cancer Father    Diabetes Brother        siblings   Hypertension Sister        x 2   Heart disease Brother    Breast cancer Paternal Grandmother    Heart disease Sister    Diabetes Sister    Tuberculosis Sister    Cirrhosis Brother     Allergies  Allergen Reactions   Epinephrine Anaphylaxis and Shortness Of Breath   Food Rash    MANGO = cause rashes around the mouth LIPS GO NUMB   Mangifera Indica Rash    THIS IS MANGO LIPS GO NUMB   Other Rash    MANGO = cause rashes around the mouth MANGO = cause rashes around the mouth LIPS GO NUMB   Prednisone Anaphylaxis, Hives, Shortness Of Breath, Itching and Swelling    The face swells HAS TAKEN CORTISONE SHOTS WITHOUT ISSUE BEFORE   Milk-Related Compounds Diarrhea    DAIRY PRODUCTS ARE A PROBLEM   Pneumococcal 13-Val Conj Vacc Itching and Other (See Comments)    Arm temp elevated at site of injection, red, itching   Prevnar 13 [Pneumococcal 13-Val Conj Vacc] Other (See Comments)    Arm temp elevated at site of injection, red, itching    Current Outpatient Medications on File Prior to Visit  Medication Sig Dispense Refill   albuterol (PROVENTIL HFA;VENTOLIN HFA) 108 (90 Base) MCG/ACT inhaler INHALE 2 PUFFS INTO THE LUNGS EVERY 4 (FOUR) HOURS AS NEEDED FOR SHORTNESS OF BREATH. 6.7 Inhaler 0   alendronate (FOSAMAX) 70 MG tablet Take 1 tablet (70 mg total) by mouth every 7 (seven) days. Take with a full glass of water on an empty stomach. 12 tablet 2   ALPRAZolam (XANAX) 0.25 MG tablet Take 0.5 tablets (0.125 mg total) by mouth daily as needed for anxiety.     Calcium Carb-Cholecalciferol (CALCIUM + VITAMIN D3 PO) Take 1,200 mg by mouth daily.     famciclovir (FAMVIR) 125 MG tablet Take 125 mg by mouth daily.     famciclovir (FAMVIR) 125 MG tablet Take by mouth.     famotidine (PEPCID) 20 MG tablet TAKE  1 TABLET (20 MG TOTAL) BY MOUTH AT BEDTIME. FOR HEARTBURN. 90 tablet 1   famotidine (PEPCID) 20 MG tablet Take 20 mg by mouth at bedtime.  HYDROcodone-acetaminophen (NORCO) 5-325 MG tablet Take 1-2 tablets by mouth every 4 (four) hours as needed for moderate pain. 8 tablet 0   hydroxychloroquine (PLAQUENIL) 200 MG tablet Take 1 tablet (200 mg total) by mouth 2 (two) times daily. 180 tablet 1   hydroxychloroquine (PLAQUENIL) 200 MG tablet Take by mouth.     levothyroxine (SYNTHROID) 75 MCG tablet Take 1 tablet (75 mcg total) by mouth daily before breakfast. on an empty stomach with water only.  No food or other medications for 30 minutes.     losartan (COZAAR) 50 MG tablet Take 2 tablets (100 mg total) by mouth daily. 180 tablet 3   Multiple Vitamins-Minerals (PRESERVISION AREDS 2 PO) Take 1 tablet by mouth in the morning and at bedtime.     triamcinolone cream (KENALOG) 0.1 % Apply 1 application topically 2 (two) times daily. 30 g 0   No current facility-administered medications on file prior to visit.    BP 138/74   Pulse 72   Temp 97.6 F (36.4 C) (Temporal)   Ht 4\' 9"  (1.448 m)   Wt 177 lb (80.3 kg)   SpO2 95%   BMI 38.30 kg/m  Objective:   Physical Exam Pulmonary:     Effort: Pulmonary effort is normal.  Skin:    Findings: Rash present.     Comments: Light red rash to bilateral upper extremities, pruritic appearing. No vesicles or hives.   Neurological:     Mental Status: She is alert.          Assessment & Plan:      This visit occurred during the SARS-CoV-2 public health emergency.  Safety protocols were in place, including screening questions prior to the visit, additional usage of staff PPE, and extensive cleaning of exam room while observing appropriate contact time as indicated for disinfecting solutions.

## 2020-07-08 ENCOUNTER — Ambulatory Visit: Payer: Medicare Other | Admitting: Primary Care

## 2020-07-11 ENCOUNTER — Ambulatory Visit
Admission: RE | Admit: 2020-07-11 | Discharge: 2020-07-11 | Disposition: A | Discharge status: Home / Self Care | Payer: Medicare Other | Source: Ambulatory Visit | Attending: Primary Care | Admitting: Primary Care

## 2020-07-11 ENCOUNTER — Other Ambulatory Visit: Payer: Self-pay

## 2020-07-11 DIAGNOSIS — Z1231 Encounter for screening mammogram for malignant neoplasm of breast: Secondary | ICD-10-CM | POA: Diagnosis not present

## 2020-07-20 ENCOUNTER — Ambulatory Visit: Payer: Medicare Other | Admitting: Internal Medicine

## 2020-07-29 ENCOUNTER — Other Ambulatory Visit: Payer: Self-pay | Admitting: Primary Care

## 2020-07-29 DIAGNOSIS — R21 Rash and other nonspecific skin eruption: Secondary | ICD-10-CM

## 2020-08-01 ENCOUNTER — Other Ambulatory Visit: Payer: Self-pay | Admitting: Primary Care

## 2020-08-01 DIAGNOSIS — R21 Rash and other nonspecific skin eruption: Secondary | ICD-10-CM

## 2020-08-03 NOTE — Progress Notes (Signed)
Follow-up Outpatient Visit Date: 08/04/2020  Primary Care Provider: Pleas Koch, NP [place] [id number]  Chief Complaint: Follow-up HFpEF and hypertension  HPI:  Teresa Jensen is a 75 y.o. female with history of chronic HFpEF, hypertension, COPD, polymyalgia rheumatica, and GERD, who presents for follow-up of shortness of breath and HFpEF.  She was last seen in our office by Teresa Montana, NP, in February.  At that time, she reported stable exertional dyspnea and happily noted a 7 pound weight loss through dietary changes.  No medication changes or additional testing were pursued.  Today, Teresa Jensen reports feeling fairly well.  She has recovered from carpal tunnel surgery.  She was surprised to hear about her diagnosis of heart failure at the time of her surgery.  She has chronic exertional dyspnea that has been stable for more than a year.  She denies edema, chest pain, palpitations, and lightheadedness.  She has not be walking/exercising much due to the heat outside, which worsens her breathing.  She does not check her BP regularly at home, but notes that it was around 140/60 the last time she measured it.  --------------------------------------------------------------------------------------------------  Cardiovascular History & Procedures: Cardiovascular Problems: HFpEF   Risk Factors: Hypertension, age >75   Cath/PCI: None   CV Surgery: None   EP Procedures and Devices: None   Non-Invasive Evaluation(s): Pharmacologic MPI (11/26/2018): Low risk, probably normal myocardial perfusion stress test.  There is small in size, mild severity, fixed apical anterior and apical defect most consistent with artifact but cannot rule out subtle scar.  No ischemia.  LVEF 56%.  No significant coronary artery calcification. TTE (02/16/2018): Normal LV size and wall thickness.  LVEF 60-65% with grade 1 diastolic dysfunction   Normal RV size and function.  Mild mitral  regurgitation.  Recent CV Pertinent Labs: Lab Results  Component Value Date   CHOL 142 10/27/2019   HDL 40.70 10/27/2019   LDLCALC 82 10/27/2019   TRIG 99.0 10/27/2019   CHOLHDL 3 10/27/2019   INR 1.16 01/07/2017   BNP 248.0 (H) 02/15/2018   K 3.9 06/17/2020   MG 2.3 01/13/2017   BUN 20 06/17/2020   BUN 8 02/24/2020   CREATININE 1.07 (H) 06/17/2020    Past medical and surgical history were reviewed and updated in EPIC.  Current Meds  Medication Sig   alendronate (FOSAMAX) 70 MG tablet Take 1 tablet (70 mg total) by mouth every 7 (seven) days. Take with a full glass of water on an empty stomach.   ALPRAZolam (XANAX) 0.25 MG tablet Take 0.5 tablets (0.125 mg total) by mouth daily as needed for anxiety.   Calcium Carb-Cholecalciferol (CALCIUM + VITAMIN D3 PO) Take 1,200 mg by mouth daily.   famotidine (PEPCID) 20 MG tablet TAKE 1 TABLET (20 MG TOTAL) BY MOUTH AT BEDTIME. FOR HEARTBURN.   hydroxychloroquine (PLAQUENIL) 200 MG tablet Take by mouth.   levothyroxine (SYNTHROID) 75 MCG tablet Take 1 tablet (75 mcg total) by mouth daily before breakfast. on an empty stomach with water only.  No food or other medications for 30 minutes.   losartan (COZAAR) 50 MG tablet Take 2 tablets (100 mg total) by mouth daily.   Multiple Vitamins-Minerals (PRESERVISION AREDS 2 PO) Take 1 tablet by mouth in the morning and at bedtime.   triamcinolone cream (KENALOG) 0.1 % Apply 1 application topically 2 (two) times daily.   triamterene-hydrochlorothiazide (MAXZIDE-25) 37.5-25 MG tablet Take 0.5 tablets by mouth in the morning.  Allergies: Epinephrine, Food, Mangifera indica, Other, Prednisone, Milk-related compounds, Pneumococcal 13-val conj vacc, and Prevnar 13 [pneumococcal 13-val conj vacc]  Social History   Tobacco Use   Smoking status: Former    Packs/day: 1.50    Years: 20.00    Pack years: 30.00    Types: Cigarettes    Quit date: 02/11/2009    Years since quitting: 11.4   Smokeless  tobacco: Never  Vaping Use   Vaping Use: Never used  Substance Use Topics   Alcohol use: No    Alcohol/week: 0.0 standard drinks   Drug use: No    Family History  Problem Relation Age of Onset   Heart attack Mother 64   Cancer Father        lung/chest wall    Prostate cancer Father    Diabetes Brother        siblings   Hypertension Sister        x 2   Heart disease Brother    Breast cancer Paternal Grandmother    Heart disease Sister    Diabetes Sister    Tuberculosis Sister    Cirrhosis Brother     Review of Systems: A 12-system review of systems was performed and was negative except as noted in the HPI.  --------------------------------------------------------------------------------------------------  Physical Exam: BP (!) 150/70 (BP Location: Left Arm, Patient Position: Sitting, Cuff Size: Large)   Pulse 81   Ht 4\' 9"  (1.448 m)   Wt 180 lb (81.6 kg)   SpO2 93%   BMI 38.95 kg/m   General:  NAD. Neck: No JVD or HJR. Lungs: Clear to auscultation bilaterally without wheezes or crackles. Heart: Regular rate and rhythm without murmurs, rubs, or gallops. Abdomen: Soft, nontender, nondistended. Extremities: No lower extremity edema.  EKG:  NSR without abnormality.  Lab Results  Component Value Date   WBC 10.0 06/17/2020   HGB 13.6 06/17/2020   HCT 41.8 06/17/2020   MCV 93.3 06/17/2020   PLT 271 06/17/2020    Lab Results  Component Value Date   NA 139 06/17/2020   K 3.9 06/17/2020   CL 104 06/17/2020   CO2 28 06/17/2020   BUN 20 06/17/2020   CREATININE 1.07 (H) 06/17/2020   GLUCOSE 134 (H) 06/17/2020   ALT 21 02/24/2020    Lab Results  Component Value Date   CHOL 142 10/27/2019   HDL 40.70 10/27/2019   LDLCALC 82 10/27/2019   TRIG 99.0 10/27/2019   CHOLHDL 3 10/27/2019    --------------------------------------------------------------------------------------------------  ASSESSMENT AND PLAN: Chronic HFpEF: Teresa Jensen appears euvolemic with  stable NYHA class II symptoms.  We discussed the nature of her heart failure with preserved ejection fraction and importance of blood pressure control.  We have agreed to add Maxzide, as outlined below.  Hypertension: Blood pressure mildly elevated today.  We will start Maxzide 37.5/25 mg 1/2 tablet daily with BMP when Ms. Nokes returns for follow-up in 1 month.  She should continue losartan 100 mg daily.  COPD: Likely contributing to chronic exertional dyspnea.  Continue current medications and pulmonary follow-up.  Follow-up: Return to clinic in 1 month with APP for blood pressure follow-up; BMP should be checked at that time.  Nelva Bush, MD 08/04/2020 9:04 AM

## 2020-08-04 ENCOUNTER — Ambulatory Visit (INDEPENDENT_AMBULATORY_CARE_PROVIDER_SITE_OTHER): Payer: Medicare Other | Admitting: Internal Medicine

## 2020-08-04 ENCOUNTER — Other Ambulatory Visit: Payer: Self-pay

## 2020-08-04 ENCOUNTER — Encounter: Payer: Self-pay | Admitting: Internal Medicine

## 2020-08-04 VITALS — BP 150/70 | HR 81 | Ht <= 58 in | Wt 180.0 lb

## 2020-08-04 DIAGNOSIS — I5032 Chronic diastolic (congestive) heart failure: Secondary | ICD-10-CM | POA: Diagnosis not present

## 2020-08-04 DIAGNOSIS — I10 Essential (primary) hypertension: Secondary | ICD-10-CM | POA: Diagnosis not present

## 2020-08-04 DIAGNOSIS — Z9889 Other specified postprocedural states: Secondary | ICD-10-CM | POA: Insufficient documentation

## 2020-08-04 DIAGNOSIS — J449 Chronic obstructive pulmonary disease, unspecified: Secondary | ICD-10-CM

## 2020-08-04 HISTORY — DX: Other specified postprocedural states: Z98.890

## 2020-08-04 MED ORDER — TRIAMTERENE-HCTZ 37.5-25 MG PO TABS: 0.5000 | ORAL_TABLET | Freq: Every morning | ORAL | 1 refills | Status: DC

## 2020-08-04 NOTE — Patient Instructions (Signed)
Medication Instructions:   Your physician has recommended you make the following change in your medication:   START Triamterene-Hydrochlorothiazide (Maxzide) 37.5mg /25mg  - Take HALF tablet every morning   *If you need a refill on your cardiac medications before your next appointment, please call your pharmacy*   Lab Work:  None ordered  Testing/Procedures:  None ordered   Follow-Up: At [facility], you and your health needs are our priority.  As part of our continuing mission to provide you with exceptional heart care, we have created designated Provider Care Teams.  These Care Teams include your primary Cardiologist (physician) and Advanced Practice Providers (APPs -  Physician Assistants and Nurse Practitioners) who all work together to provide you with the care you need, when you need it.  We recommend signing up for the patient portal called "MyChart".  Sign up information is provided on this After Visit Summary.  MyChart is used to connect with patients for Virtual Visits (Telemedicine).  Patients are able to view lab/test results, encounter notes, upcoming appointments, etc.  Non-urgent messages can be sent to your provider as well.   To learn more about what you can do with MyChart, go to [email or web address].    Your next appointment:   1 month(s)  The format for your next appointment:   In Person  Provider:   You will see one of the following Advanced Practice Providers on your designated Care Team:   Murray Hodgkins, NP Christell Faith, PA-C Marrianne Mood, PA-C [place], [place]

## 2020-08-06 IMAGING — DX DG CHEST 2V
2 series · 2 of 2 positions shown · non-contrast
Comparison: 01/12/2017

CLINICAL DATA: Cough, shortness of breath, former smoker,
hypertension, COPD, GERD, polymyalgia rheumatica

EXAM:
CHEST - 2 VIEW

[chest pa]
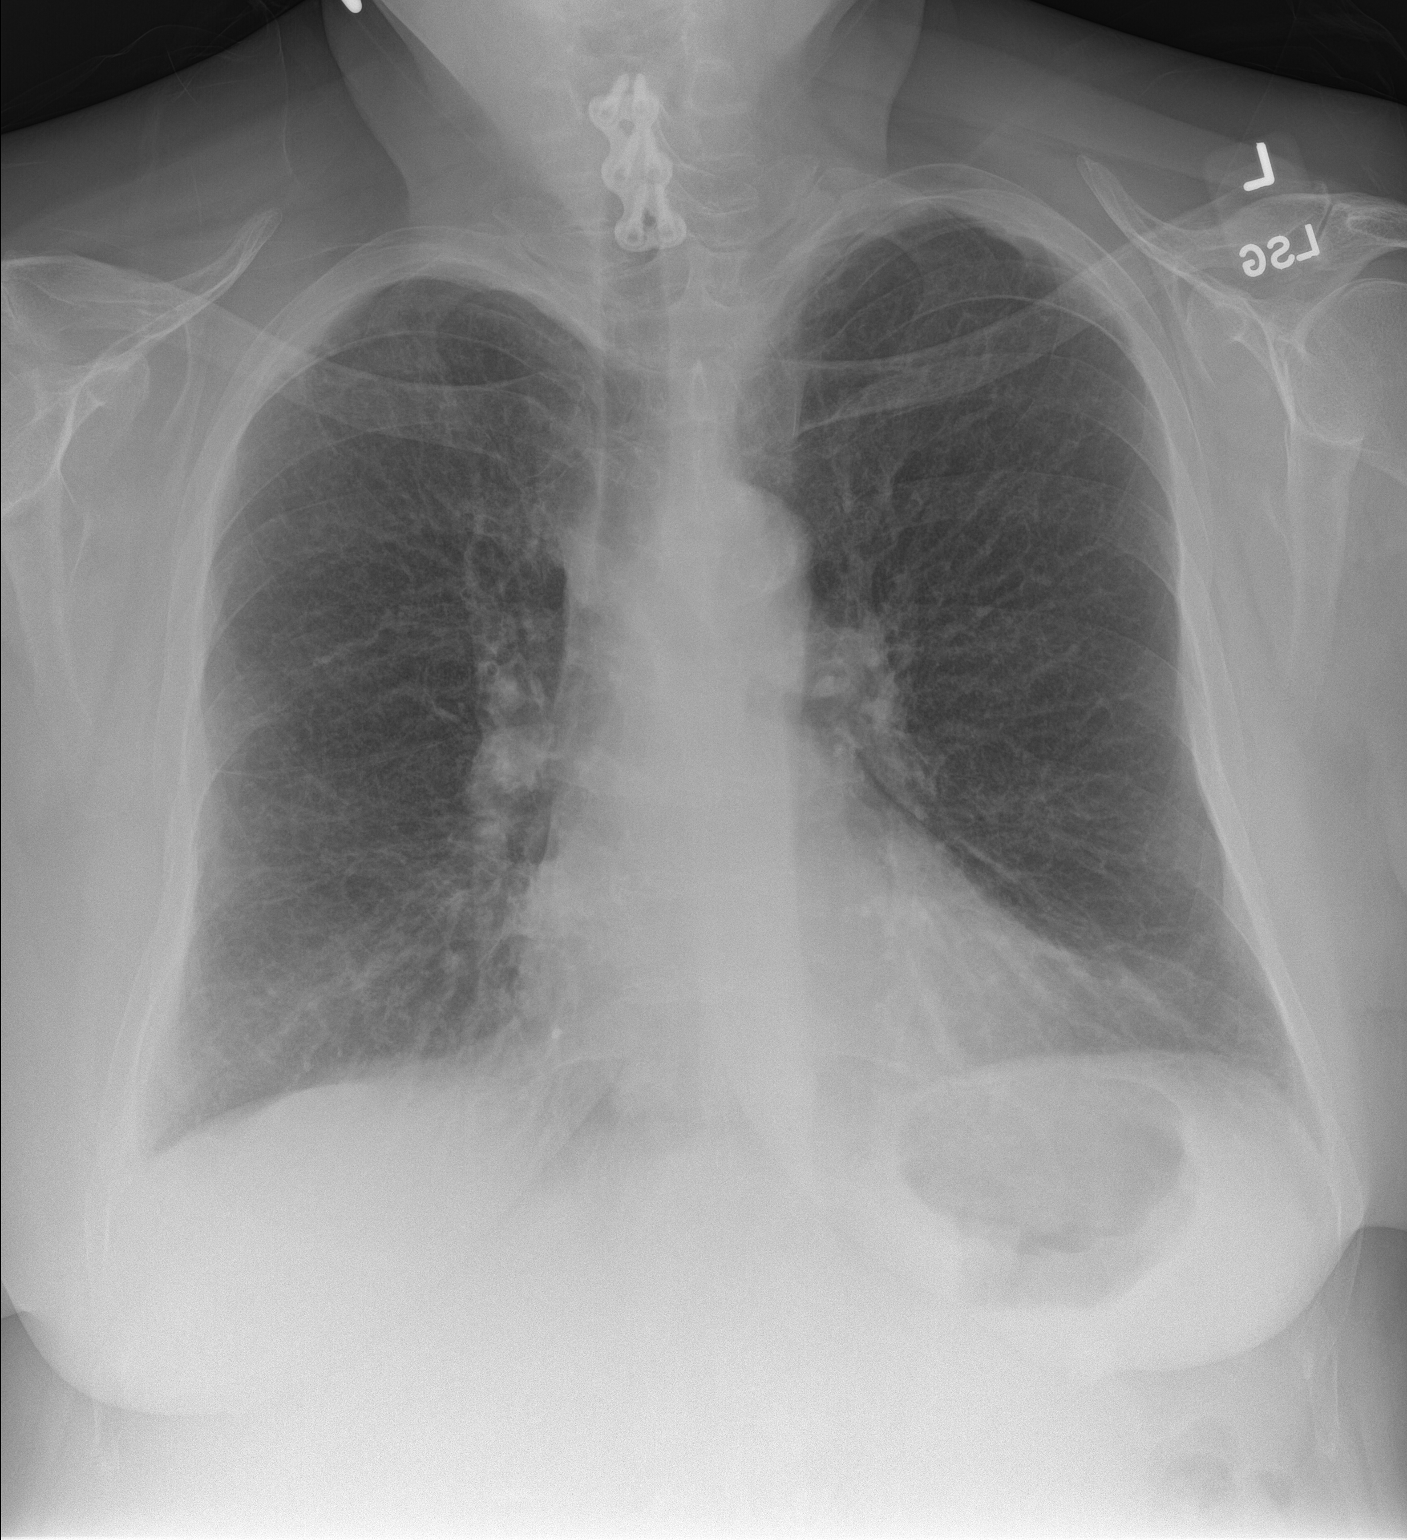

[chest lat]
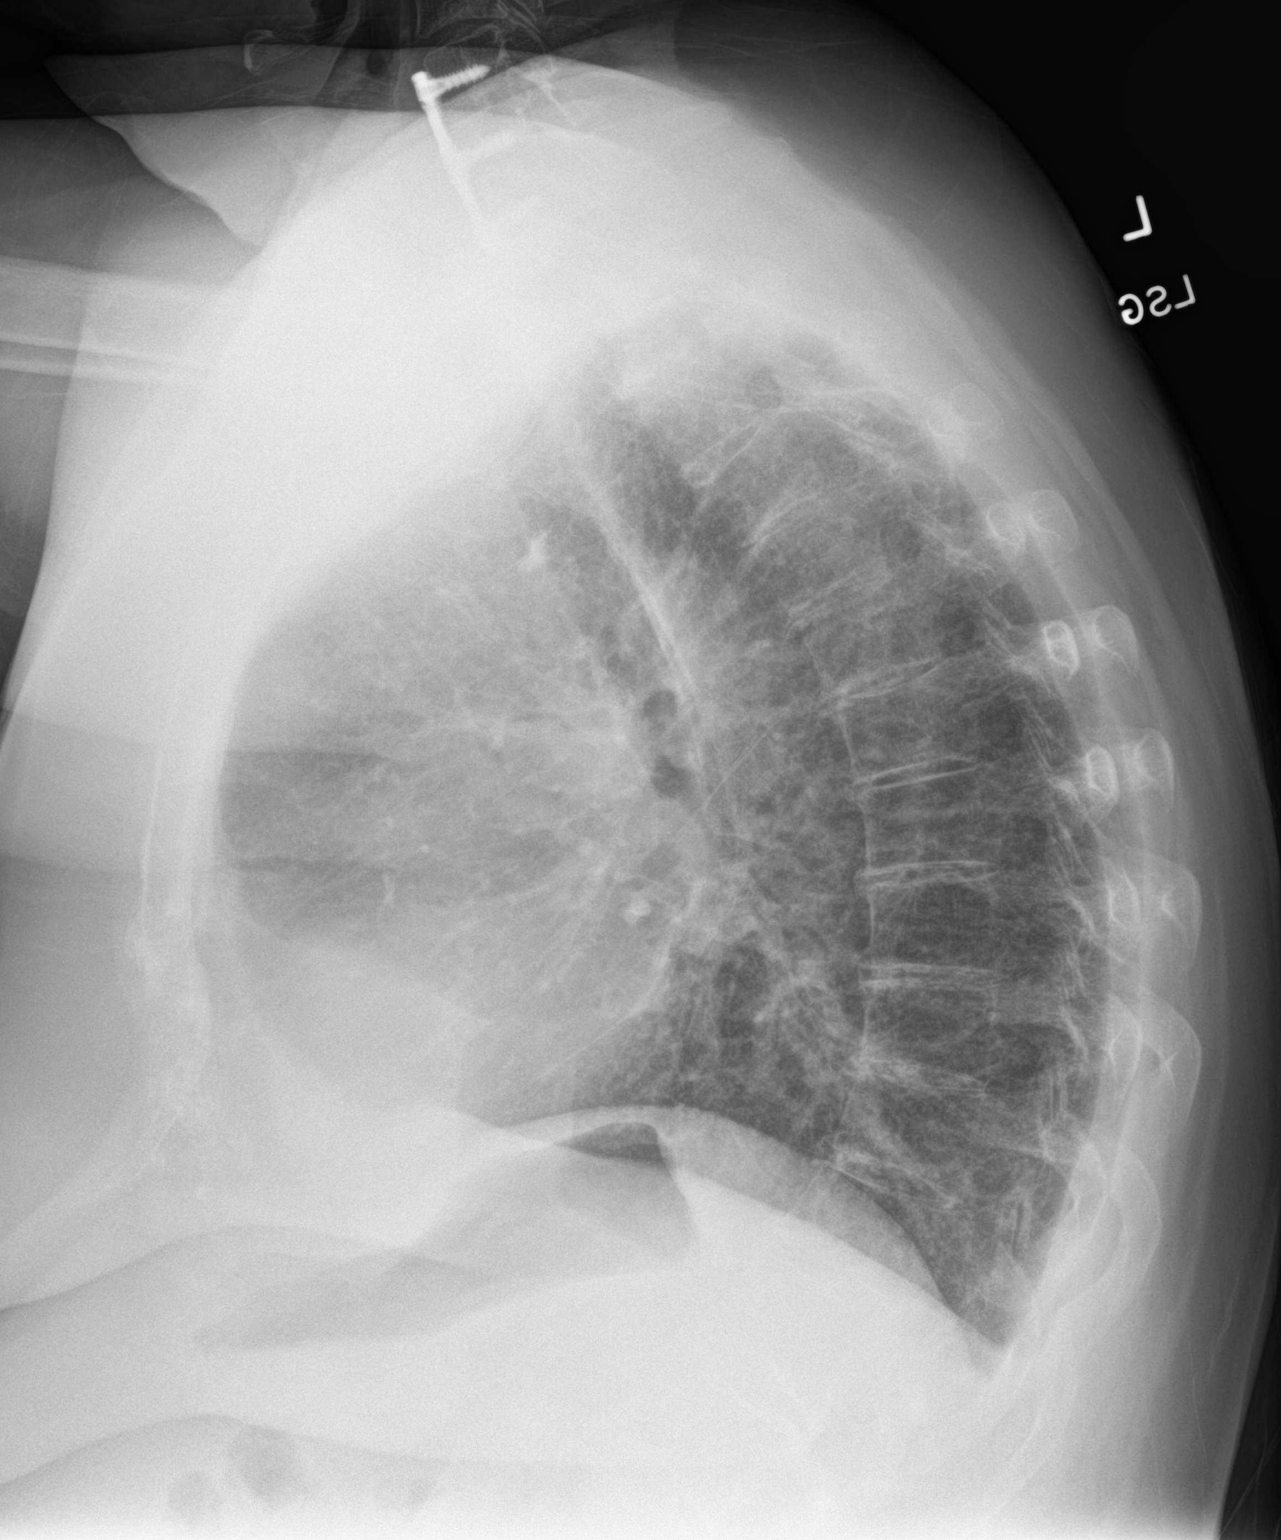

[2 of 2 positions shown; findings below may reference images not displayed]

FINDINGS: Upper normal size of cardiac silhouette.

Atherosclerotic calcification aorta.

Mediastinal contours and pulmonary vascularity normal.

Emphysematous and bronchitic changes consistent with COPD.

Chronic interstitial prominence, stable.

No acute infiltrate, pleural effusion, or pneumothorax.

Bones demineralized.

Prior cervical spine fusion.
IMPRESSION: COPD changes without acute infiltrate.

## 2020-08-27 ENCOUNTER — Other Ambulatory Visit: Payer: Self-pay | Admitting: Internal Medicine

## 2020-09-01 ENCOUNTER — Ambulatory Visit (INDEPENDENT_AMBULATORY_CARE_PROVIDER_SITE_OTHER): Payer: Medicare Other | Admitting: Primary Care

## 2020-09-01 ENCOUNTER — Encounter: Payer: Self-pay | Admitting: Primary Care

## 2020-09-01 ENCOUNTER — Other Ambulatory Visit: Payer: Self-pay

## 2020-09-01 VITALS — BP 134/80 | HR 72 | Temp 98.6°F | Ht <= 58 in | Wt 180.0 lb

## 2020-09-01 DIAGNOSIS — Z23 Encounter for immunization: Secondary | ICD-10-CM

## 2020-09-01 DIAGNOSIS — L84 Corns and callosities: Secondary | ICD-10-CM

## 2020-09-01 NOTE — Patient Instructions (Addendum)
Protect the callus with bandaids or rubber barriers that you can find at the drug store.  Please call me if you develop increased redness, any swelling to the site, skin breakdown.   It was a pleasure to see you today!  Corns and Calluses Corns are small areas of thickened skin that form on the top, sides, or tip of a toe. Corns have a cone-shaped core with a point that can press on a nerve below. This causes pain. Calluses are areas of thickened skin that can form anywhere on the body, including the hands, fingers, palms, soles of the feet, and heels. Calluses are usually larger than corns. What are the causes? Corns and calluses are caused by rubbing (friction) or pressure, such as from shoes that are too tight or do not fit properly. What increases the risk? Corns are more likely to develop in people who have misshapen toes (toe deformities), such as hammer toes. Calluses can form with friction to any area of the skin. They are more likely to develop in people who: Work with their hands. Wear shoes that fit poorly, are too tight, or are high-heeled. Have toe deformities. What are the signs or symptoms? Symptoms of a corn or callus include: A hard growth on the skin. Pain or tenderness under the skin. Redness and swelling. Increased discomfort while wearing tight-fitting shoes, if your feet are affected. If a corn or callus becomes infected, symptoms may include: Redness and swelling that gets worse. Pain. Fluid, blood, or pus draining from the corn or callus. How is this diagnosed? Corns and calluses may be diagnosed based on your symptoms, your medical history, and a physical exam. How is this treated? Treatment for corns and calluses may include: Removing the cause of the friction or pressure. This may involve: Changing your shoes. Wearing shoe inserts (orthotics) or other protective layers in your shoes, such as a corn pad. Wearing gloves. Applying medicine to the skin  (topical medicine) to help soften skin in the hardened, thickened areas. Removing layers of dead skin with a file to reduce the size of the corn or callus. Removing the corn or callus with a scalpel or laser. Taking antibiotic medicines, if your corn or callus is infected. Having surgery, if a toe deformity is the cause. Follow these instructions at home:  Take over-the-counter and prescription medicines only as told by your health care provider. If you were prescribed an antibiotic medicine, take it as told by your health care provider. Do not stop taking it even if your condition improves. Wear shoes that fit well. Avoid wearing high-heeled shoes and shoes that are too tight or too loose. Wear any padding, protective layers, gloves, or orthotics as told by your health care provider. Soak your hands or feet. Then use a file or pumice stone to soften your corn or callus. Do this as told by your health care provider. Check your corn or callus every day for signs of infection. Contact a health care provider if: Your symptoms do not improve with treatment. You have redness or swelling that gets worse. Your corn or callus becomes painful. You have fluid, blood, or pus coming from your corn or callus. You have new symptoms. Get help right away if: You develop severe pain with redness. Summary Corns are small areas of thickened skin that form on the top, sides, or tip of a toe. These can be painful. Calluses are areas of thickened skin that can form anywhere on the body, including the  hands, fingers, palms, and soles of the feet. Calluses are usually larger than corns. Corns and calluses are caused by rubbing (friction) or pressure, such as from shoes that are too tight or do not fit properly. Treatment may include wearing padding, protective layers, gloves, or orthotics as told by your health care provider. This information is not intended to replace advice given to you by your health care  provider. Make sure you discuss any questions you have with your health care provider. Document Revised: 04/16/2019 Document Reviewed: 04/16/2019 Elsevier Patient Education  [place].          Influenza (Flu) Vaccine (Inactivated or Recombinant): What You Need to Know 1. Why get vaccinated? Influenza vaccine can prevent influenza (flu). Flu is a contagious disease that spreads around the [place] every year, usually between October and May. Anyone can get the flu, but it is more dangerous for some people. Infants and young children, people 58 years and older, pregnant people, and people with certain health conditions or a weakened immune system are at greatest risk of flu complications. Pneumonia, bronchitis, sinus infections, and ear infections are examples of flu-related complications. If you have a medical condition, such as heart disease, cancer, or diabetes, flu can make it worse. Flu can cause fever and chills, sore throat, muscle aches, fatigue, cough, headache, and runny or stuffy nose. Some people may have vomiting and diarrhea, though this is more common in children than adults. In an average year, thousands of people in the [place] die from flu, and many more are hospitalized. Flu vaccine prevents millions of illnesses and flu-related visits to the doctor each year. 2. Influenza vaccines CDC recommends everyone 6 months and older get vaccinated every flu season. Children 6 months through 67 years of age may need 2 doses during a single flu season. Everyone else needs only 1 dose each flu season. It takes about 2 weeks for protection to develop after vaccination. There are many flu viruses, and they are always changing. Each year a new flu vaccine is made to protect against the influenza viruses believed to be likely to cause disease in the upcoming flu season. Even when the vaccine doesn't exactly match these viruses, it may still provide some  protection. Influenza vaccine does not cause flu. Influenza vaccine may be given at the same time as other vaccines. 3. Talk with your health care provider Tell your vaccination provider if the person getting the vaccine: Has had an allergic reaction after a previous dose of influenza vaccine, or has any severe, life-threatening allergies Has ever had Guillain-Barr Syndrome (also called "GBS") In some cases, your health care provider may decide to postpone influenza vaccination until a future visit. Influenza vaccine can be administered at any time during pregnancy. People who are or will be pregnant during influenza season should receive inactivated influenza vaccine. People with minor illnesses, such as a cold, may be vaccinated. People who are moderately or severely ill should usually wait until they recover before getting influenza vaccine. Your health care provider can give you more information. 4. Risks of a vaccine reaction Soreness, redness, and swelling where the shot is given, fever, muscle aches, and headache can happen after influenza vaccination. There may be a very small increased risk of Guillain-Barr Syndrome (GBS) after inactivated influenza vaccine (the flu shot). Young children who get the flu shot along with pneumococcal vaccine (PCV13) and/or DTaP vaccine at the same time might be slightly more likely to have a seizure  caused by fever. Tell your health care provider if a child who is getting flu vaccine has ever had a seizure. People sometimes faint after medical procedures, including vaccination. Tell your provider if you feel dizzy or have vision changes or ringing in the ears. As with any medicine, there is a very remote chance of a vaccine causing a severe allergic reaction, other serious injury, or death. 5. What if there is a serious problem? An allergic reaction could occur after the vaccinated person leaves the clinic. If you see signs of a severe allergic reaction  (hives, swelling of the face and throat, difficulty breathing, a fast heartbeat, dizziness, or weakness), call 9-1-1 and get the person to the nearest hospital. For other signs that concern you, call your health care provider. Adverse reactions should be reported to the Vaccine Adverse Event Reporting System (VAERS). Your health care provider will usually file this report, or you can do it yourself. Visit the VAERS website at www.vaers.SamedayNews.es or call 731-574-3413. VAERS is only for reporting reactions, and VAERS staff members do not give medical advice. 6. The National Vaccine Injury Compensation Program The [employer] Vaccine Injury Compensation Program (VICP) is a federal program that was created to compensate people who may have been injured by certain vaccines. Claims regarding alleged injury or death due to vaccination have a time limit for filing, which may be as short as two years. Visit the VICP website at GoldCloset.com.ee or call (503) 224-2117 to learn about the program and about filing a claim. 7. How can I learn more? Ask your health care provider. Call your local or state health department. Visit the website of the Food and Drug Administration (FDA) for vaccine package inserts and additional information at TraderRating.uy. Contact the Centers for Disease Control and Prevention (CDC): Call 249-309-8587 (1-800-CDC-INFO) or Visit CDC's website at https://gibson.com/. Vaccine Information Statement Inactivated Influenza Vaccine (08/07/2019) This information is not intended to replace advice given to you by your health care provider. Make sure you discuss any questions you have with your health care provider. Document Revised: 09/24/2019 Document Reviewed: 09/24/2019 Elsevier Patient Education  2022 [employer].

## 2020-09-01 NOTE — Assessment & Plan Note (Signed)
Chronic, acute on chronic flare.  Today there is no evidence of skin breakdown or infection. We discussed to protect the site of the callus with various forms of treatment OTC.  We also discussed to monitor for s/s of infection.  Follow up PRN.

## 2020-09-01 NOTE — Progress Notes (Signed)
Subjective:    Patient ID: Teresa Jensen, female    DOB: Oct 08, 1945, 75 y.o.   MRN: [id number]  HPI  Teresa Jensen is a very pleasant 75 y.o. female with a history of hypertension, CHF, COPD, hypothyroidism, osteoarthritis, osteoporosis who presents today to discuss foot pain.  Her pain is located to the right and left medial aspect of her foot, left worse than right. Chronic calloses for years, worse two days ago. She's filed down the calluses and applied neosporin.   She denies fevers, joint swelling to the toes. Today her pain is less than yesterday. Yesterday the site of her left foot calloses were "throbbing".   BP Readings from Last 3 Encounters:  09/01/20 134/80  08/04/20 (!) 150/70  07/07/20 138/74      Review of Systems  Constitutional:  Negative for fever.  Skin:  Negative for color change.       Calloses         Past Medical History:  Diagnosis Date  . (HFpEF) heart failure with preserved ejection fraction ([place])   . Anxiety   . Aortic atherosclerosis ([place])   . CAP (community acquired pneumonia)   . COPD (chronic obstructive pulmonary disease) ([place])   . Dyspnea    DUE TO HEAT WITH COPD  . Epistaxis 01/09/2017  . GERD (gastroesophageal reflux disease)    OCC-NO MEDS  . Grade I diastolic dysfunction   . Hypertension   . Hypothyroidism   . Iliac artery stenosis, bilateral ([place])   . New onset of headaches   . Osteopenia   . PMR (polymyalgia rheumatica) (HCC)   . Pneumonia 02/13/2018  . PONV (postoperative nausea and vomiting)    VERY NAUSEATED AFTER SURGERY  . Shingles   . Stasis dermatitis   . Strain of chest wall 01/20/2011  . Tobacco abuse     Social History   Socioeconomic History  . Marital status: Married    Spouse name: [name]  . Number of children: 1  . Years of education: Not on file  . Highest education level: Not on file  Occupational History  . Occupation: Therapist, art as a Therapist, art: RETIRED    Comment:  retired  Tobacco Use  . Smoking status: Former    Packs/day: 1.50    Years: 20.00    Pack years: 30.00    Types: Cigarettes    Quit date: 02/11/2009    Years since quitting: 11.5  . Smokeless tobacco: Never  Vaping Use  . Vaping Use: Never used  Substance and Sexual Activity  . Alcohol use: No    Alcohol/week: 0.0 standard drinks  . Drug use: No  . Sexual activity: Not Currently  Other Topics Concern  . Not on file  Social History Narrative   Lives in [place] with her husband.  Takes care of her two grandchildren, ages 39 and 2, everyday in [place].  Has one son who is 40.   Desires CPR   Would not want prolonged life support if futile   Social Determinants of Health   Financial Resource Strain: Not on file  Food Insecurity: Not on file  Transportation Needs: Not on file  Physical Activity: Not on file  Stress: Not on file  Social Connections: Not on file  Intimate Partner Violence: Not on file    Past Surgical History:  Procedure Laterality Date  . ABDOMINAL HYSTERECTOMY    . APPENDECTOMY    . BACK SURGERY  LUMBAR FUSION  . CARPAL TUNNEL RELEASE Left   . CARPAL TUNNEL RELEASE Left 08/31/2019   Procedure: CARPAL TUNNEL RELEASE;  Surgeon: Dereck Leep, MD;  Location: [facility] ORS;  Service: Orthopedics;  Laterality: Left;  . CARPAL TUNNEL RELEASE Right 06/22/2020   Procedure: CARPAL TUNNEL RELEASE;  Surgeon: Dereck Leep, MD;  Location: [facility] ORS;  Service: Orthopedics;  Laterality: Right;  . CERVICAL FUSION    . CHOLECYSTECTOMY    . HEMORRHOID SURGERY    . LUMBAR FUSION    . TUBAL LIGATION      Family History  Problem Relation Age of Onset  . Heart attack Mother 40  . Cancer Father        lung/chest wall   . Prostate cancer Father   . Diabetes Brother        siblings  . Hypertension Sister        x 2  . Heart disease Brother   . Breast cancer Paternal Grandmother   . Heart disease Sister   . Diabetes Sister   . Tuberculosis Sister   . Cirrhosis  Brother     Allergies  Allergen Reactions  . Epinephrine Anaphylaxis and Shortness Of Breath  . Food Rash    MANGO = cause rashes around the mouth LIPS GO NUMB  . Mangifera Indica Rash    THIS IS MANGO LIPS GO NUMB  . Other Rash    MANGO = cause rashes around the mouth MANGO = cause rashes around the mouth LIPS GO NUMB  . Prednisone Anaphylaxis, Hives, Shortness Of Breath, Itching and Swelling    The face swells HAS TAKEN CORTISONE SHOTS WITHOUT ISSUE BEFORE  . Milk-Related Compounds Diarrhea    DAIRY PRODUCTS ARE A PROBLEM  . Pneumococcal 13-Val Conj Vacc Itching and Other (See Comments)    Arm temp elevated at site of injection, red, itching  . Prevnar 13 [Pneumococcal 13-Val Conj Vacc] Other (See Comments)    Arm temp elevated at site of injection, red, itching    Current Outpatient Medications on File Prior to Visit  Medication Sig Dispense Refill  . alendronate (FOSAMAX) 70 MG tablet Take 1 tablet (70 mg total) by mouth every 7 (seven) days. Take with a full glass of water on an empty stomach. 12 tablet 2  . ALPRAZolam (XANAX) 0.25 MG tablet Take 0.5 tablets (0.125 mg total) by mouth daily as needed for anxiety.    . Calcium Carb-Cholecalciferol (CALCIUM + VITAMIN D3 PO) Take 1,200 mg by mouth daily.    . famotidine (PEPCID) 20 MG tablet TAKE 1 TABLET (20 MG TOTAL) BY MOUTH AT BEDTIME. FOR HEARTBURN. 90 tablet 1  . hydroxychloroquine (PLAQUENIL) 200 MG tablet Take by mouth.    . levothyroxine (SYNTHROID) 75 MCG tablet Take 1 tablet (75 mcg total) by mouth daily before breakfast. on an empty stomach with water only.  No food or other medications for 30 minutes.    [place] Kitchen losartan (COZAAR) 50 MG tablet Take 2 tablets (100 mg total) by mouth daily. 180 tablet 3  . Multiple Vitamins-Minerals (PRESERVISION AREDS 2 PO) Take 1 tablet by mouth in the morning and at bedtime.    . triamcinolone cream (KENALOG) 0.1 % Apply 1 application topically 2 (two) times daily. 30 g 0  .  triamterene-hydrochlorothiazide (MAXZIDE-25) 37.5-25 MG tablet TAKE 1/2 TABLET BY MOUTH IN THE MORNING 15 tablet 1   No current facility-administered medications on file prior to visit.    BP 134/80   Pulse  72   Temp 98.6 F (37 C) (Temporal)   Ht 4\' 9"  (1.448 m)   Wt 180 lb (81.6 kg)   SpO2 95%   BMI 38.95 kg/m  Objective:   Physical Exam Musculoskeletal:       Feet:  Feet:     Comments: Calluses noted to bilateral feet as indicated in picture. No warmth or skin breakdown noted. Mild erythema to left callous at first metatarsal joint.           Assessment & Plan:      This visit occurred during the SARS-CoV-2 public health emergency.  Safety protocols were in place, including screening questions prior to the visit, additional usage of staff PPE, and extensive cleaning of exam room while observing appropriate contact time as indicated for disinfecting solutions.

## 2020-09-02 DIAGNOSIS — Z23 Encounter for immunization: Secondary | ICD-10-CM | POA: Diagnosis not present

## 2020-09-09 ENCOUNTER — Encounter: Payer: Self-pay | Admitting: Medical

## 2020-09-09 ENCOUNTER — Ambulatory Visit (INDEPENDENT_AMBULATORY_CARE_PROVIDER_SITE_OTHER): Payer: Medicare Other | Admitting: Medical

## 2020-09-09 ENCOUNTER — Other Ambulatory Visit: Payer: Self-pay

## 2020-09-09 VITALS — BP 122/62 | HR 83 | Ht <= 58 in | Wt 180.0 lb

## 2020-09-09 DIAGNOSIS — I5032 Chronic diastolic (congestive) heart failure: Secondary | ICD-10-CM | POA: Diagnosis not present

## 2020-09-09 DIAGNOSIS — I10 Essential (primary) hypertension: Secondary | ICD-10-CM

## 2020-09-09 DIAGNOSIS — E782 Mixed hyperlipidemia: Secondary | ICD-10-CM | POA: Diagnosis not present

## 2020-09-09 NOTE — Patient Instructions (Signed)
Medication Instructions:  Your physician recommends that you continue on your current medications as directed. Please refer to the Current Medication list given to you today.  *If you need a refill on your cardiac medications before your next appointment, please call your pharmacy*   Lab Work: Bmp today If you have labs (blood work) drawn today and your tests are completely normal, you will receive your results only by: [place] (if you have MyChart) OR A paper copy in the mail If you have any lab test that is abnormal or we need to change your treatment, we will call you to review the results.   Testing/Procedures: None ordered   Follow-Up: At [facility], you and your health needs are our priority.  As part of our continuing mission to provide you with exceptional heart care, we have created designated Provider Care Teams.  These Care Teams include your primary Cardiologist (physician) and Advanced Practice Providers (APPs -  Physician Assistants and Nurse Practitioners) who all work together to provide you with the care you need, when you need it.  We recommend signing up for the patient portal called "MyChart".  Sign up information is provided on this After Visit Summary.  MyChart is used to connect with patients for Virtual Visits (Telemedicine).  Patients are able to view lab/test results, encounter notes, upcoming appointments, etc.  Non-urgent messages can be sent to your provider as well.   To learn more about what you can do with MyChart, go to [email or web address].    Your next appointment:   4 week(s)  The format for your next appointment:   In Person  Provider:   You may see Nelva Bush, MD or one of the following Advanced Practice Providers on your designated Care Team:   Murray Hodgkins, NP Christell Faith, PA-C Marrianne Mood, PA-C Cadence Kathlen Mody, [place]   Other Instructions N/A

## 2020-09-09 NOTE — Progress Notes (Signed)
Cardiology Office Note:    Date:  09/09/2020   ID:  Teresa Jensen, DOB 11/22/1945, MRN [id number]  PCP:  Pleas Koch, NP  [facility] Cardiologist:  Nelva Bush, MD  [facility] Electrophysiologist:  None   Referring MD: Pleas Koch, NP   Chief Complaint: 1 month follow-up  History of Present Illness:    Teresa Jensen is a 75 y.o. female with a hx of HFpEF, HTN, COPD, polymyalgia rheumatica, GERD who presents for follow-up.   Initially seen 11/2018 by Dr. Saunders Revel following hospitalization for shortness of breath and weakness.  Chest x-ray suggestive of pulmonary edema.  Echocardiogram showed preserved LVEF with grade 1 diastolic dysfunction.  She did subsequent myocardial perfusion stress test which was low risk without evidence of ischemia.   At last clinic visit 05/22/2019 she reported stable exertional dyspnea.  Her losartan was increased to 100 mg daily due to suboptimal control of her blood pressure. Seen in clinic 06/29/19 she was euvolemic and her BP was much improved. Her medications were continued.    Seen in follow-up October 2021.  Her blood pressure was noted to be elevated though she was hesitant regarding medication changes. Through follow up MyChart message, Amlodipine 5mg  QD was started though subsequently discontinued due to lower extremity edema.  Last seen 08/04/20 and was feeling fairly well. Maxide was added.   Today, the patient has been doing well since the last visit. She has been tolerating the medication well. She is going to the bathroom a lot at night, maybe 3-4 times. She takes the [facility] in the [place], however doesn't feel nightly trips to the bathroom are intolerable. BP is good today. She doesn't take bp at home. BMP today   Past Medical History:  Diagnosis Date   (HFpEF) heart failure with preserved ejection fraction (HCC)    Anxiety    Aortic atherosclerosis (HCC)    CAP (community acquired pneumonia)    COPD (chronic  obstructive pulmonary disease) ([place])    Dyspnea    DUE TO HEAT WITH COPD   Epistaxis 01/09/2017   GERD (gastroesophageal reflux disease)    OCC-NO MEDS   Grade I diastolic dysfunction    Hypertension    Hypothyroidism    Iliac artery stenosis, bilateral ([place])    New onset of headaches    Osteopenia    PMR (polymyalgia rheumatica) (HCC)    Pneumonia 02/13/2018   PONV (postoperative nausea and vomiting)    VERY NAUSEATED AFTER SURGERY   Shingles    Stasis dermatitis    Strain of chest wall 01/20/2011   Tobacco abuse     Past Surgical History:  Procedure Laterality Date   ABDOMINAL HYSTERECTOMY     APPENDECTOMY     BACK SURGERY     LUMBAR FUSION   CARPAL TUNNEL RELEASE Left    CARPAL TUNNEL RELEASE Left 08/31/2019   Procedure: CARPAL TUNNEL RELEASE;  Surgeon: Dereck Leep, MD;  Location: [facility] ORS;  Service: Orthopedics;  Laterality: Left;   CARPAL TUNNEL RELEASE Right 06/22/2020   Procedure: CARPAL TUNNEL RELEASE;  Surgeon: Dereck Leep, MD;  Location: [facility] ORS;  Service: Orthopedics;  Laterality: Right;   CERVICAL FUSION     CHOLECYSTECTOMY     HEMORRHOID SURGERY     LUMBAR FUSION     TUBAL LIGATION      Current Medications: Current Meds  Medication Sig   alendronate (FOSAMAX) 70 MG tablet Take 1 tablet (70 mg total) by mouth every  7 (seven) days. Take with a full glass of water on an empty stomach.   ALPRAZolam (XANAX) 0.25 MG tablet Take 0.5 tablets (0.125 mg total) by mouth daily as needed for anxiety.   Calcium Carb-Cholecalciferol (CALCIUM + VITAMIN D3 PO) Take 1,200 mg by mouth daily.   famotidine (PEPCID) 20 MG tablet TAKE 1 TABLET (20 MG TOTAL) BY MOUTH AT BEDTIME. FOR HEARTBURN.   hydroxychloroquine (PLAQUENIL) 200 MG tablet Take by mouth.   levothyroxine (SYNTHROID) 75 MCG tablet Take 1 tablet (75 mcg total) by mouth daily before breakfast. on an empty stomach with water only.  No food or other medications for 30 minutes.   losartan (COZAAR) 50 MG tablet  Take 2 tablets (100 mg total) by mouth daily.   Multiple Vitamins-Minerals (PRESERVISION AREDS 2 PO) Take 1 tablet by mouth in the morning and at bedtime.   triamcinolone cream (KENALOG) 0.1 % Apply 1 application topically 2 (two) times daily.   triamterene-hydrochlorothiazide (MAXZIDE-25) 37.5-25 MG tablet TAKE 1/2 TABLET BY MOUTH IN THE MORNING     Allergies:   Epinephrine, Food, Mangifera indica, Other, Prednisone, Milk-related compounds, Pneumococcal 13-val conj vacc, and Prevnar 13 [pneumococcal 13-val conj vacc]   Social History   Socioeconomic History   Marital status: Married    Spouse name: [name]   Number of children: 1   Years of education: Not on file   Highest education level: Not on file  Occupational History   Occupation: Therapist, art as a Therapist, art: RETIRED    Comment: retired  Tobacco Use   Smoking status: Former    Packs/day: 1.50    Years: 20.00    Pack years: 30.00    Types: Cigarettes    Quit date: 02/11/2009    Years since quitting: 11.5   Smokeless tobacco: Never  Vaping Use   Vaping Use: Never used  Substance and Sexual Activity   Alcohol use: No    Alcohol/week: 0.0 standard drinks   Drug use: No   Sexual activity: Not Currently  Other Topics Concern   Not on file  Social History Narrative   Lives in [place] with her husband.  Takes care of her two grandchildren, ages 74 and 2, everyday in [place].  Has one son who is 67.   Desires CPR   Would not want prolonged life support if futile   Social Determinants of Health   Financial Resource Strain: Not on file  Food Insecurity: Not on file  Transportation Needs: Not on file  Physical Activity: Not on file  Stress: Not on file  Social Connections: Not on file     Family History: The patient's family history includes Breast cancer in her paternal grandmother; Cancer in her father; Cirrhosis in her brother; Diabetes in her brother and sister; Heart attack (age of onset: 60)  in her mother; Heart disease in her brother and sister; Hypertension in her sister; Prostate cancer in her father; Tuberculosis in her sister.  ROS:   Please see the history of present illness.     All other systems reviewed and are negative.  EKGs/Labs/Other Studies Reviewed:    The following studies were reviewed today:  Echo 02/2018  1. The left ventricle has normal systolic function with an ejection  fraction of 60-65%. The cavity size was normal. Left ventricular diastolic  Doppler parameters are consistent with impaired relaxation.   2. The right ventricle has normal systolic function. The cavity was  normal. There is no increase  in right ventricular wall thickness.   3. Right atrial pressure is estimated at 10 mmHg.   4. Unable to estimate RVSP   Myoview Lexiscan 11/2018 Narrative & Impression  Low risk, probably normal pharmacologic myocardial perfusion stress test. There is a small in size, mild in severity, fixed defect involving the apical anterior and apical segments. This is most consistent with artifact (attenuation and apical thinning) but cannot rule out subtle scar. There is no significant ischemia. The left ventricular ejection fraction is normal (56%). There is no significant coronary artery calcification. Aortic atherosclerosis is noted on the attenuation correction CT. This is a low risk study.    EKG:  EKG is not ordered today.    Recent Labs: 10/27/2019: TSH 1.88 02/24/2020: ALT 21 06/17/2020: BUN 20; Creatinine, Ser 1.07; Hemoglobin 13.6; Platelets 271; Potassium 3.9; Sodium 139  Recent Lipid Panel    Component Value Date/Time   CHOL 142 10/27/2019 1100   TRIG 99.0 10/27/2019 1100   HDL 40.70 10/27/2019 1100   CHOLHDL 3 10/27/2019 1100   VLDL 19.8 10/27/2019 1100   LDLCALC 82 10/27/2019 1100   Physical Exam:    VS:  BP 122/62 (BP Location: Left Arm, Patient Position: Sitting, Cuff Size: Normal)   Pulse 83   Ht 4\' 9"  (1.448 m)   Wt 180 lb (81.6  kg)   SpO2 95%   BMI 38.95 kg/m     Wt Readings from Last 3 Encounters:  09/09/20 180 lb (81.6 kg)  09/01/20 180 lb (81.6 kg)  08/04/20 180 lb (81.6 kg)     GEN:  Well nourished, well developed in no acute distress HEENT: Normal NECK: No JVD; No carotid bruits LYMPHATICS: No lymphadenopathy CARDIAC: RRR, no murmurs, rubs, gallops RESPIRATORY:  Clear to auscultation without rales, wheezing or rhonchi  ABDOMEN: Soft, non-tender, non-distended MUSCULOSKELETAL:  No edema; No deformity  SKIN: Warm and dry NEUROLOGIC:  Alert and oriented x 3 PSYCHIATRIC:  Normal affect   ASSESSMENT:    1. Chronic heart failure with preserved ejection fraction (HFpEF) ([place])   2. Hyperlipidemia, mixed   3. Chronic diastolic heart failure ([place])   4. Essential hypertension    PLAN:    In order of problems listed above:  HTN Patient has been taking Maxide 1/2 pill daily. Even though she takes it in the morning she is going to the bathroom 3-4 times a night. Discussed changing her medication, however patient feels this lifestyle is tolerable, also she just got a 30 day supply and is wanting to use the pills. She would like to continue medication for now and we will see her back in a month to discuss further medication changes.She may better tolerate HCTZ, BMET today. Continue Losartan 100mg  daily.   HFpEF Echo from 02/2018 showed LVEF 60-65%, impaired relaxation. She is euvolemic on exam. Continue Maxide and Losartan. Continue low salt diet and compression socks as needed.   COPD Stable with no exacerbation.   Disposition: Follow up in 1 month(s) with MD/APP    Signed, Racer Quam Ninfa Meeker, PA-C  09/09/2020 10:32 AM    [facility]

## 2020-09-10 LAB — BASIC METABOLIC PANEL
BUN/Creatinine Ratio: 22 (ref 12–28)
BUN: 27 mg/dL (ref 8–27)
CO2: 22 mmol/L (ref 20–29)
Calcium: 9.7 mg/dL (ref 8.7–10.3)
Chloride: 102 mmol/L (ref 96–106)
Creatinine, Ser: 1.22 mg/dL — ABNORMAL HIGH (ref 0.57–1.00)
Glucose: 100 mg/dL — ABNORMAL HIGH (ref 65–99)
Potassium: 4.6 mmol/L (ref 3.5–5.2)
Sodium: 140 mmol/L (ref 134–144)
eGFR: 46 mL/min/{1.73_m2} — ABNORMAL LOW (ref >59)

## 2020-09-12 ENCOUNTER — Telehealth: Payer: Self-pay

## 2020-09-12 DIAGNOSIS — Z79899 Other long term (current) drug therapy: Secondary | ICD-10-CM

## 2020-09-12 MED ORDER — HYDROCHLOROTHIAZIDE 12.5 MG PO CAPS: 12.5000 mg | ORAL_CAPSULE | Freq: Every day | ORAL | 3 refills | Status: DC

## 2020-09-12 NOTE — Telephone Encounter (Signed)
Able to reach pt regarding her recent lab work, [employer], PA-C had a chance to review their results and advised   "Labs show mildly increased kidney function/BUN. I would like to stop the maxide and start HCTZ 12.5mg  daily. BMET in a week. Continue with scheduled follow-up "  Teresa Jensen verbalized understanding, will get her new [place] within the next 2 days, will stop Maxzide. Labs on 9/23 at 09:15 am in office. All questions or concerns were address and no additional concerns at this time. Agreeable to plan, will call back for anything further.

## 2020-09-23 ENCOUNTER — Other Ambulatory Visit: Payer: Self-pay

## 2020-09-23 ENCOUNTER — Other Ambulatory Visit (INDEPENDENT_AMBULATORY_CARE_PROVIDER_SITE_OTHER): Payer: Medicare Other

## 2020-09-23 DIAGNOSIS — Z79899 Other long term (current) drug therapy: Secondary | ICD-10-CM | POA: Diagnosis not present

## 2020-09-24 LAB — BASIC METABOLIC PANEL
BUN/Creatinine Ratio: 20 (ref 12–28)
BUN: 21 mg/dL (ref 8–27)
CO2: 23 mmol/L (ref 20–29)
Calcium: 9.5 mg/dL (ref 8.7–10.3)
Chloride: 103 mmol/L (ref 96–106)
Creatinine, Ser: 1.05 mg/dL — ABNORMAL HIGH (ref 0.57–1.00)
Glucose: 108 mg/dL — ABNORMAL HIGH (ref 65–99)
Potassium: 5 mmol/L (ref 3.5–5.2)
Sodium: 142 mmol/L (ref 134–144)
eGFR: 55 mL/min/{1.73_m2} — ABNORMAL LOW (ref >59)

## 2020-10-04 ENCOUNTER — Emergency Department
Admission: EM | Admit: 2020-10-04 | Discharge: 2020-10-04 | Disposition: A | Discharge status: Home / Self Care | Payer: Medicare Other | Attending: Emergency Medicine | Admitting: Emergency Medicine

## 2020-10-04 ENCOUNTER — Other Ambulatory Visit: Payer: Self-pay

## 2020-10-04 DIAGNOSIS — Z87891 Personal history of nicotine dependence: Secondary | ICD-10-CM | POA: Diagnosis not present

## 2020-10-04 DIAGNOSIS — Z79899 Other long term (current) drug therapy: Secondary | ICD-10-CM | POA: Diagnosis not present

## 2020-10-04 DIAGNOSIS — I509 Heart failure, unspecified: Secondary | ICD-10-CM | POA: Insufficient documentation

## 2020-10-04 DIAGNOSIS — J449 Chronic obstructive pulmonary disease, unspecified: Secondary | ICD-10-CM | POA: Diagnosis not present

## 2020-10-04 DIAGNOSIS — R531 Weakness: Secondary | ICD-10-CM | POA: Diagnosis present

## 2020-10-04 DIAGNOSIS — N3000 Acute cystitis without hematuria: Secondary | ICD-10-CM | POA: Diagnosis not present

## 2020-10-04 DIAGNOSIS — E039 Hypothyroidism, unspecified: Secondary | ICD-10-CM | POA: Diagnosis not present

## 2020-10-04 DIAGNOSIS — I110 Hypertensive heart disease with heart failure: Secondary | ICD-10-CM | POA: Insufficient documentation

## 2020-10-04 LAB — BASIC METABOLIC PANEL
Anion gap: 12 (ref 5–15)
BUN: 17 mg/dL (ref 8–23)
CO2: 27 mmol/L (ref 22–32)
Calcium: 9.1 mg/dL (ref 8.9–10.3)
Chloride: 100 mmol/L (ref 98–111)
Creatinine, Ser: 1 mg/dL (ref 0.44–1.00)
GFR, Estimated: 59 mL/min — ABNORMAL LOW (ref >60)
Glucose, Bld: 131 mg/dL — ABNORMAL HIGH (ref 70–99)
Potassium: 4 mmol/L (ref 3.5–5.1)
Sodium: 139 mmol/L (ref 135–145)

## 2020-10-04 LAB — URINALYSIS, COMPLETE (UACMP) WITH MICROSCOPIC
Bilirubin Urine: NEGATIVE
Glucose, UA: NEGATIVE mg/dL
Hgb urine dipstick: NEGATIVE
Ketones, ur: NEGATIVE mg/dL
Nitrite: NEGATIVE
Protein, ur: NEGATIVE mg/dL
Specific Gravity, Urine: 1.015 (ref 1.005–1.030)
pH: 5 (ref 5.0–8.0)

## 2020-10-04 LAB — CBC
HCT: 41.4 % (ref 36.0–46.0)
Hemoglobin: 13.8 g/dL (ref 12.0–15.0)
MCH: 31.6 pg (ref 26.0–34.0)
MCHC: 33.3 g/dL (ref 30.0–36.0)
MCV: 94.7 fL (ref 80.0–100.0)
Platelets: 261 10*3/uL (ref 150–400)
RBC: 4.37 MIL/uL (ref 3.87–5.11)
RDW: 13.7 % (ref 11.5–15.5)
WBC: 10 10*3/uL (ref 4.0–10.5)
nRBC: 0 % (ref 0.0–0.2)

## 2020-10-04 LAB — TSH: TSH: 3.363 u[IU]/mL (ref 0.350–4.500)

## 2020-10-04 MED ORDER — CEPHALEXIN 500 MG PO CAPS: 500.0000 mg | ORAL_CAPSULE | Freq: Two times a day (BID) | ORAL | 0 refills | Status: AC

## 2020-10-04 NOTE — ED Triage Notes (Signed)
Pt here with weakness that started Sunday. Pt states that she recently had a death in the family and that may be related to her symptoms. Pt denies N/V/D.

## 2020-10-04 NOTE — ED Notes (Signed)
Pt given warm blankets.

## 2020-10-04 NOTE — ED Provider Notes (Signed)
[facility] Emergency Department Provider Note   ____________________________________________   Event Date/Time   First MD Initiated Contact with Patient 10/04/20 1019     (approximate)  I have reviewed the triage vital signs and the nursing notes.   HISTORY  Chief Complaint Weakness    HPI Teresa Jensen is a 75 y.o. female with past medical history of hypertension, COPD, CHF, hypothyroidism, and anxiety who presents to the ED complaining of generalized weakness.  Patient reports that she has been feeling intermittently weak for the past 2 days and states the weakness affects her entire body diffusely.  She denies any vision changes, speech changes, focal numbness or weakness.  She has not had any fevers, headache, chest pain, shortness of breath, vomiting, diarrhea, or dysuria.  She has not had any recent changes to her medications.  She does state that she has been under significant stress lately due to the passing of her sister 6 days ago.  She reports continuing to feel weak at this time but states symptoms are improving from earlier today.        Past Medical History:  Diagnosis Date   (HFpEF) heart failure with preserved ejection fraction ([place])    Anxiety    Aortic atherosclerosis ([place])    CAP (community acquired pneumonia)    COPD (chronic obstructive pulmonary disease) ([place])    Dyspnea    DUE TO HEAT WITH COPD   Epistaxis 01/09/2017   GERD (gastroesophageal reflux disease)    OCC-NO MEDS   Grade I diastolic dysfunction    Hypertension    Hypothyroidism    Iliac artery stenosis, bilateral ([place])    New onset of headaches    Osteopenia    PMR (polymyalgia rheumatica) ([place])    Pneumonia 02/13/2018   PONV (postoperative nausea and vomiting)    VERY NAUSEATED AFTER SURGERY   Shingles    Stasis dermatitis    Strain of chest wall 01/20/2011   Tobacco abuse     Patient Active Problem List   Diagnosis Date Noted   Foot callus  09/01/2020   Rash and nonspecific skin eruption 07/07/2020   Bilateral carpal tunnel syndrome 08/16/2019   Skin mass 04/30/2019   Chronic heart failure with preserved ejection fraction (HFpEF) ([place]) 11/12/2018   DOE (dyspnea on exertion) 11/12/2018   Abnormal electrocardiogram 11/12/2018   Left leg pain 04/17/2018   COPD exacerbation ([place]) 01/21/2018   Osteoporosis 12/16/2017   Back pain 06/06/2017   Low vitamin B12 level 01/13/2017   Iatrogenic hypotension 01/12/2017   Acute kidney failure ([place]) 01/12/2017   Anemia 01/12/2017   Essential hypertension 01/08/2017   Epistaxis 01/07/2017   Hand numbness 10/22/2016   Right hand weakness 10/22/2016   Closed fracture of metatarsal bone 09/28/2016   Prediabetes 10/17/2015   Herpes zoster 07/08/2015   Pain of right lower extremity 05/20/2015   Medicare annual wellness visit, subsequent 10/12/2014   Occipital neuralgia 10/12/2014   Vitamin D deficiency 10/12/2014   Erythema of lower extremity 09/08/2013   Persistent cough 06/14/2010   Chronic obstructive pulmonary disease ([place]) 06/05/2010   Hypothyroidism 09/29/2009   Situational anxiety 10/01/2008   GERD 10/01/2008   Osteoarthritis 10/01/2008    Past Surgical History:  Procedure Laterality Date   ABDOMINAL HYSTERECTOMY     APPENDECTOMY     BACK SURGERY     LUMBAR FUSION   CARPAL TUNNEL RELEASE Left    CARPAL TUNNEL RELEASE Left 08/31/2019   Procedure: CARPAL TUNNEL RELEASE;  Surgeon: Dereck Leep, MD;  Location: [facility] ORS;  Service: Orthopedics;  Laterality: Left;   CARPAL TUNNEL RELEASE Right 06/22/2020   Procedure: CARPAL TUNNEL RELEASE;  Surgeon: Dereck Leep, MD;  Location: [facility] ORS;  Service: Orthopedics;  Laterality: Right;   CERVICAL FUSION     CHOLECYSTECTOMY     HEMORRHOID SURGERY     LUMBAR FUSION     TUBAL LIGATION      Prior to Admission medications   Medication Sig Start Date End Date Taking? Authorizing Provider  cephALEXin (KEFLEX) 500 MG capsule Take  1 capsule (500 mg total) by mouth 2 (two) times daily for 7 days. 10/04/20 10/11/20 Yes Blake Divine, MD  alendronate (FOSAMAX) 70 MG tablet Take 1 tablet (70 mg total) by mouth every 7 (seven) days. Take with a full glass of water on an empty stomach. 06/22/20   Hooten, Laurice Record, MD  ALPRAZolam Duanne Moron) 0.25 MG tablet Take 0.5 tablets (0.125 mg total) by mouth daily as needed for anxiety. 06/22/20   Hooten, Laurice Record, MD  Calcium Carb-Cholecalciferol (CALCIUM + VITAMIN D3 PO) Take 1,200 mg by mouth daily.    [provider]  famotidine (PEPCID) 20 MG tablet TAKE 1 TABLET (20 MG TOTAL) BY MOUTH AT BEDTIME. FOR HEARTBURN. 04/29/20   Pleas Koch, NP  hydrochlorothiazide (MICROZIDE) 12.5 MG capsule Take 1 capsule (12.5 mg total) by mouth daily. 09/12/20 12/11/20  Furth, Cadence H, PA-C  hydroxychloroquine (PLAQUENIL) 200 MG tablet Take by mouth. 06/01/20   [provider]  levothyroxine (SYNTHROID) 75 MCG tablet Take 1 tablet (75 mcg total) by mouth daily before breakfast. on an empty stomach with water only.  No food or other medications for 30 minutes. 06/22/20   Hooten, Laurice Record, MD  losartan (COZAAR) 50 MG tablet Take 2 tablets (100 mg total) by mouth daily. 06/28/20   End, Harrell Gave, MD  Multiple Vitamins-Minerals (PRESERVISION AREDS 2 PO) Take 1 tablet by mouth in the morning and at bedtime.    [provider]  triamcinolone cream (KENALOG) 0.1 % Apply 1 application topically 2 (two) times daily. 07/04/20   Sharion Balloon, NP    Allergies Epinephrine, Food, Mangifera indica, Other, Prednisone, Milk-related compounds, Pneumococcal 13-val conj vacc, and Prevnar 13 [pneumococcal 13-val conj vacc]  Family History  Problem Relation Age of Onset   Heart attack Mother 72   Cancer Father        lung/chest wall    Prostate cancer Father    Diabetes Brother        siblings   Hypertension Sister        x 2   Heart disease Brother    Breast cancer Paternal Grandmother     Heart disease Sister    Diabetes Sister    Tuberculosis Sister    Cirrhosis Brother     Social History Social History   Tobacco Use   Smoking status: Former    Packs/day: 1.50    Years: 20.00    Pack years: 30.00    Types: Cigarettes    Quit date: 02/11/2009    Years since quitting: 11.6   Smokeless tobacco: Never  Vaping Use   Vaping Use: Never used  Substance Use Topics   Alcohol use: No    Alcohol/week: 0.0 standard drinks   Drug use: No    Review of Systems  Constitutional: No fever/chills.  Positive for generalized weakness. Eyes: No visual changes. ENT: No sore throat. Cardiovascular: Denies chest pain. Respiratory:  Denies shortness of breath. Gastrointestinal: No abdominal pain.  No nausea, no vomiting.  No diarrhea.  No constipation. Genitourinary: Negative for dysuria. Musculoskeletal: Negative for back pain. Skin: Negative for rash. Neurological: Negative for headaches, focal weakness or numbness.  ____________________________________________   PHYSICAL EXAM:  VITAL SIGNS: ED Triage Vitals [10/04/20 0852]  Enc Vitals Group     BP (!) 148/55     Pulse Rate 91     Resp 18     Temp 97.9 F (36.6 C)     Temp Source Oral     SpO2 95 %     Weight 179 lb (81.2 kg)     Height 4\' 9"  (1.448 m)     Head Circumference      Peak Flow      Pain Score 0     Pain Loc      Pain Edu?      Excl. in [place]?     Constitutional: Alert and oriented. Eyes: Conjunctivae are normal. Head: Atraumatic. Nose: No congestion/rhinnorhea. Mouth/Throat: Mucous membranes are moist. Neck: Normal ROM Cardiovascular: Normal rate, regular rhythm. Grossly normal heart sounds.  2+ radial pulses bilaterally. Respiratory: Normal respiratory effort.  No retractions. Lungs CTAB. Gastrointestinal: Soft and nontender. No distention. Genitourinary: deferred Musculoskeletal: No lower extremity tenderness nor edema. Neurologic:  Normal speech and language. No gross focal neurologic  deficits are appreciated.  Ambulates with a steady gait. Skin:  Skin is warm, dry and intact. No rash noted. Psychiatric: Mood and affect are normal. Speech and behavior are normal.  ____________________________________________   LABS (all labs ordered are listed, but only abnormal results are displayed)  Labs Reviewed  BASIC METABOLIC PANEL - Abnormal; Notable for the following components:      Result Value   Glucose, Bld 131 (*)    GFR, Estimated 59 (*)    All other components within normal limits  URINALYSIS, COMPLETE (UACMP) WITH MICROSCOPIC - Abnormal; Notable for the following components:   Color, Urine YELLOW (*)    APPearance HAZY (*)    Leukocytes,Ua TRACE (*)    Bacteria, UA MANY (*)    All other components within normal limits  URINE CULTURE  CBC  TSH  CBG MONITORING, ED   ____________________________________________  EKG  ED ECG REPORT I, Blake Divine, the attending physician, personally viewed and interpreted this ECG.   Date: 10/04/2020  EKG Time: 9:00  Rate: 79  Rhythm: normal sinus rhythm, occasional PVC noted, unifocal  Axis: Normal  Intervals:none  ST&T Change: None   PROCEDURES  Procedure(s) performed (including Critical Care):  Procedures   ____________________________________________   INITIAL IMPRESSION / ASSESSMENT AND PLAN / ED COURSE      75 year old female with past medical history of hypertension, COPD, CHF, hypothyroidism, and anxiety who presents to the ED complaining of intermittent generalized weakness over the past couple of days.  She denies any focal neurologic symptoms and does not have any focal deficits on my exam.  Vital signs are reassuring, patient denies any symptoms of infectious process.  EKG shows no evidence of arrhythmia or ischemia and basic labs are unremarkable.  We will add on TSH and check UA, but if these are unremarkable she would be appropriate for discharge home with PCP follow-up.  TSH within normal  limits, UA appears concerning for infection.  Patient has been able to ambulate without difficulty and vital signs remain reassuring.  She is appropriate for discharge home with PCP follow-up, was counseled to return to the  ED for new worsening symptoms, patient agrees with plan.      ____________________________________________   FINAL CLINICAL IMPRESSION(S) / ED DIAGNOSES  Final diagnoses:  Generalized weakness  Acute cystitis without hematuria     ED Discharge Orders          Ordered    cephALEXin (KEFLEX) 500 MG capsule  2 times daily        10/04/20 1227             Note:  This document was prepared using Dragon voice recognition software and may include unintentional dictation errors.    Blake Divine, MD 10/04/20 1228

## 2020-10-04 NOTE — ED Notes (Signed)
Pt stating she has been feeling weak for the past couple of days. Pt denies CP or SOB. Pt in NAD. VSS.

## 2020-10-06 LAB — URINE CULTURE: Culture: >=100000 — AB

## 2020-10-10 ENCOUNTER — Ambulatory Visit: Payer: Medicare Other | Admitting: Medical

## 2020-10-12 ENCOUNTER — Other Ambulatory Visit: Payer: Self-pay

## 2020-10-12 ENCOUNTER — Telehealth (INDEPENDENT_AMBULATORY_CARE_PROVIDER_SITE_OTHER): Payer: Medicare Other | Admitting: Primary Care

## 2020-10-12 ENCOUNTER — Encounter: Payer: Self-pay | Admitting: Primary Care

## 2020-10-12 DIAGNOSIS — F4321 Adjustment disorder with depressed mood: Secondary | ICD-10-CM

## 2020-10-12 NOTE — Patient Instructions (Signed)
Please call us if you need anything!  It was a pleasure to see you today!

## 2020-10-12 NOTE — Assessment & Plan Note (Addendum)
Moderate grief over the recent loss of her sister. No suicidal ideation.   Discussed therapy referral, she kindly declines as she has support at home and through her church.   Advised her to call if she reconsiders therapy referral or develops symptoms of anxiety. She thanked [place] for the call.  I evaluated patient, was consulted regarding treatment, and agree with assessment and plan per Budd Palmer, RN, DNP student.   Allie Bossier, NP-C

## 2020-10-12 NOTE — Progress Notes (Signed)
   [name] - 75 y.o. female  MRN [id number]  Date of Birth: 11-21-1945  PCP: Pleas Koch, NP  This service was provided via telemedicine. Phone Visit performed on 10/12/2020    Rationale for phone visit along with limitations reviewed. I discussed the limitations, risks, security and privacy concerns of performing a phone visit and the availability of in person appointments. I also discussed with the patient that there may be a patient responsible charge related to this service. Patient consented to telephone encounter.    Location of patient: Home Location of provider: Budd Palmer, NP Student and Allie Bossier, NP; [facility] [place] Name of referring provider: N/A   Names of persons and role in encounter: Provider: Ninfa Meeker, RN & Allie Bossier, NP Patient: Teresa Jensen  Other: N/A   Time on call: 7 min, 0 seconds   Subjective: Chief Complaint  Patient presents with   Anxiety    Increased after sister passed. Tightness of chest at times "sadness comes over me".      HPI:  Teresa Jensen is a 75 year old female with a past medical history of situational anxiety who is presenting today to discuss her mood. Her sister whom she was very close with passed away one week ago and she has had overwhelming and constant sadness since her loss. She denies any suicidal ideation or feelings of anxiety. She has not had any changes in her sleep or appetite related to her mood. She denies palpitations, chest pain, shortness of breath or any other physical symptoms. She has taken 0.25 mg of Xanax in the past for anxiety and thinks this might be helpful to help her get through this tough time. She has a good support system and prefers talking to her pastor from church over utilizing a therapist. She prefers not to take a daily medication for her symptoms.     Flowsheet Row Video Visit from 10/12/2020 in [facility] [place]  PHQ-9 Total Score 1       GAD 7  : Generalized Anxiety Score 10/12/2020  Nervous, Anxious, on Edge 1  Control/stop worrying 0  Worry too much - different things 0  Trouble relaxing 0  Restless 0  Easily annoyed or irritable 0  Afraid - awful might happen 0  Total GAD 7 Score 1  Anxiety Difficulty Not difficult at all      Objective/Observations:  No physical exam or vital signs collected unless specifically identified below.   Ht 4\' 9"  (1.448 m)   Wt 179 lb (81.2 kg)   BMI 38.74 kg/m    Respiratory status: speaks in complete sentences without evident shortness of breath.   Assessment/Plan:  No problem-specific Assessment & Plan notes found for this encounter.   I discussed the assessment and treatment plan with the patient. The patient was provided an opportunity to ask questions and all were answered. The patient agreed with the plan and demonstrated an understanding of the instructions.  Lab Orders  No laboratory test(s) ordered today    No orders of the defined types were placed in this encounter.   The patient was advised to call back or seek an in-person evaluation if the symptoms worsen or if the condition fails to improve as anticipated.  Ninfa Meeker, RN

## 2020-10-12 NOTE — Progress Notes (Signed)
   Teresa Jensen - 75 y.o. female  MRN [id number]  Date of Birth: 06/27/1945  PCP: Pleas Koch, NP  This service was provided via telemedicine. Phone Visit performed on 10/12/2020    Rationale for phone visit along with limitations reviewed. I discussed the limitations, risks, security and privacy concerns of performing a phone visit and the availability of in person appointments. I also discussed with the patient that there may be a patient responsible charge related to this service. Patient consented to telephone encounter.    Location of patient: Home Location of provider: [facility] [place] Name of referring provider: N/A   Names of persons and role in encounter: Provider: Pleas Koch, NP  Patient: Teresa Jensen  Other: N/A   Time on call: 7  Min - 0 seconds    Subjective: Chief Complaint  Patient presents with   Anxiety    Increased after sister passed. Tightness of chest at times "sadness comes over me".      HPI:  Ms. Arpin is a 75 year old female with a history of situational anxiety who presents today to discuss anxiety.   Symptoms began about one week ago after the passing of her sister for whom she was very close with. She feels very sad and down. She denies SI/HI, palpitations, panic, anxiety. She plans on speaking with her pastor given her symptoms.   She's not sure what she needs, mostly just needed to talk. She has plenty of support from her friends, neighbors, and husband.    PHQ9 SCORE ONLY 10/12/2020 09/01/2020 10/27/2019  PHQ-9 Total Score 1 0 0   GAD 7 : Generalized Anxiety Score 10/12/2020  Nervous, Anxious, on Edge 1  Control/stop worrying 0  Worry too much - different things 0  Trouble relaxing 0  Restless 0  Easily annoyed or irritable 0  Afraid - awful might happen 0  Total GAD 7 Score 1  Anxiety Difficulty Not difficult at all       Objective/Observations:  No physical exam or vital signs collected unless  specifically identified below.   Ht 4\' 9"  (1.448 m)   Wt 179 lb (81.2 kg)   BMI 38.74 kg/m    Respiratory status: speaks in complete sentences without evident shortness of breath.   Psych: Mood appears down. Appropriate behavior given the circumstances.   Assessment/Plan:  See under problem list.  No problem-specific Assessment & Plan notes found for this encounter.   I discussed the assessment and treatment plan with the patient. The patient was provided an opportunity to ask questions and all were answered. The patient agreed with the plan and demonstrated an understanding of the instructions.  Lab Orders  No laboratory test(s) ordered today    No orders of the defined types were placed in this encounter.   The patient was advised to call back or seek an in-person evaluation if the symptoms worsen or if the condition fails to improve as anticipated.  Pleas Koch, NP

## 2020-10-18 ENCOUNTER — Telehealth: Payer: Self-pay | Admitting: Dermatology

## 2020-10-18 ENCOUNTER — Other Ambulatory Visit: Payer: Self-pay | Admitting: Dermatology

## 2020-10-20 NOTE — Progress Notes (Signed)
Cardiology Office Note:    Date:  10/21/2020   ID:  MERL GUARDINO, DOB 07/06/1945, MRN [id number]  PCP:  Pleas Koch, NP  [facility] Cardiologist:  Nelva Bush, MD  [facility] Electrophysiologist:  None   Referring MD: Pleas Koch, NP   Chief Complaint: 4 week follow-up  History of Present Illness:    Teresa Jensen is a 75 y.o. female with a hx of with a hx of HFpEF, HTN, COPD, polymyalgia rheumatica, GERD who presents for follow-up.    Initially seen 11/2018 by Dr. Saunders Revel following hospitalization for shortness of breath and weakness.  Chest x-ray suggestive of pulmonary edema.  Echocardiogram showed preserved LVEF with grade 1 diastolic dysfunction.  She did subsequent myocardial perfusion stress test which was low risk without evidence of ischemia.   At last clinic visit 05/22/2019 she reported stable exertional dyspnea.  Her losartan was increased to 100 mg daily due to suboptimal control of her blood pressure. Seen in clinic 06/29/19 she was euvolemic and her BP was much improved. Her medications were continued.    Seen in follow-up October 2021.  Her blood pressure was noted to be elevated though she was hesitant regarding medication changes. Through follow up MyChart message, Amlodipine 5mg  QD was started though subsequently discontinued due to lower extremity edema.   seen 08/04/20 and was feeling fairly well. Maxide was added.   Last seen 09/2020 and was overall doing well, Maxide was causing nocturnal urination. No changes at that time  Ed visit early October 2022 for weakness. Work-up showed UTI and she was discharged.   Today, the patient reports she has been doing good since the last visit. She is just on HCTZ, still gets up at times to urinate, but is also drinking a good amount. No chest pain, sob, LLE, orthopnea, pnd. BP good. No other changes since the last visit.    Past Medical History:  Diagnosis Date   (HFpEF) heart failure with  preserved ejection fraction (HCC)    Anxiety    Aortic atherosclerosis (HCC)    CAP (community acquired pneumonia)    COPD (chronic obstructive pulmonary disease) ([place])    Dyspnea    DUE TO HEAT WITH COPD   Epistaxis 01/09/2017   GERD (gastroesophageal reflux disease)    OCC-NO MEDS   Grade I diastolic dysfunction    Hypertension    Hypothyroidism    Iliac artery stenosis, bilateral ([place])    New onset of headaches    Osteopenia    PMR (polymyalgia rheumatica) (HCC)    Pneumonia 02/13/2018   PONV (postoperative nausea and vomiting)    VERY NAUSEATED AFTER SURGERY   Shingles    Stasis dermatitis    Strain of chest wall 01/20/2011   Tobacco abuse     Past Surgical History:  Procedure Laterality Date   ABDOMINAL HYSTERECTOMY     APPENDECTOMY     BACK SURGERY     LUMBAR FUSION   CARPAL TUNNEL RELEASE Left    CARPAL TUNNEL RELEASE Left 08/31/2019   Procedure: CARPAL TUNNEL RELEASE;  Surgeon: Dereck Leep, MD;  Location: [facility] ORS;  Service: Orthopedics;  Laterality: Left;   CARPAL TUNNEL RELEASE Right 06/22/2020   Procedure: CARPAL TUNNEL RELEASE;  Surgeon: Dereck Leep, MD;  Location: [facility] ORS;  Service: Orthopedics;  Laterality: Right;   CERVICAL FUSION     CHOLECYSTECTOMY     HEMORRHOID SURGERY     LUMBAR FUSION     TUBAL  LIGATION      Current Medications: Current Meds  Medication Sig   alendronate (FOSAMAX) 70 MG tablet Take 1 tablet (70 mg total) by mouth every 7 (seven) days. Take with a full glass of water on an empty stomach.   Calcium Carb-Cholecalciferol (CALCIUM + VITAMIN D3 PO) Take 1,200 mg by mouth daily.   hydrochlorothiazide (MICROZIDE) 12.5 MG capsule Take 1 capsule (12.5 mg total) by mouth daily.   hydroxychloroquine (PLAQUENIL) 200 MG tablet Take by mouth.   levothyroxine (SYNTHROID) 75 MCG tablet Take 1 tablet (75 mcg total) by mouth daily before breakfast. on an empty stomach with water only.  No food or other medications for 30 minutes.    losartan (COZAAR) 50 MG tablet Take 2 tablets (100 mg total) by mouth daily.   Multiple Vitamins-Minerals (PRESERVISION AREDS 2 PO) Take 1 tablet by mouth in the morning and at bedtime.   triamcinolone cream (KENALOG) 0.1 % Apply 1 application topically 2 (two) times daily.   [DISCONTINUED] famotidine (PEPCID) 20 MG tablet TAKE 1 TABLET (20 MG TOTAL) BY MOUTH AT BEDTIME. FOR HEARTBURN.     Allergies:   Epinephrine, Food, Mangifera indica, Other, Prednisone, Milk-related compounds, Pneumococcal 13-val conj vacc, and Prevnar 13 [pneumococcal 13-val conj vacc]   Social History   Socioeconomic History   Marital status: Married    Spouse name: [name]   Number of children: 1   Years of education: Not on file   Highest education level: Not on file  Occupational History   Occupation: Therapist, art as a Therapist, art: RETIRED    Comment: retired  Tobacco Use   Smoking status: Former    Packs/day: 1.50    Years: 20.00    Pack years: 30.00    Types: Cigarettes    Quit date: 02/11/2009    Years since quitting: 11.6   Smokeless tobacco: Never  Vaping Use   Vaping Use: Never used  Substance and Sexual Activity   Alcohol use: No    Alcohol/week: 0.0 standard drinks   Drug use: No   Sexual activity: Not Currently  Other Topics Concern   Not on file  Social History Narrative   Lives in [place] with her husband.  Takes care of her two grandchildren, ages 67 and 2, everyday in [place].  Has one son who is 81.   Desires CPR   Would not want prolonged life support if futile   Social Determinants of Health   Financial Resource Strain: Not on file  Food Insecurity: Not on file  Transportation Needs: Not on file  Physical Activity: Not on file  Stress: Not on file  Social Connections: Not on file     Family History: The patient's family history includes Breast cancer in her paternal grandmother; Cancer in her father; Cirrhosis in her brother; Diabetes in her brother and  sister; Heart attack (age of onset: 11) in her mother; Heart disease in her brother and sister; Hypertension in her sister; Prostate cancer in her father; Tuberculosis in her sister.  ROS:   Please see the history of present illness.     All other systems reviewed and are negative.  EKGs/Labs/Other Studies Reviewed:    The following studies were reviewed today:  Echo 02/2018  1. The left ventricle has normal systolic function with an ejection  fraction of 60-65%. The cavity size was normal. Left ventricular diastolic  Doppler parameters are consistent with impaired relaxation.   2. The right ventricle has normal  systolic function. The cavity was  normal. There is no increase in right ventricular wall thickness.   3. Right atrial pressure is estimated at 10 mmHg.   4. Unable to estimate RVSP    Myoview Lexiscan 11/2018 Narrative & Impression  Low risk, probably normal pharmacologic myocardial perfusion stress test. There is a small in size, mild in severity, fixed defect involving the apical anterior and apical segments. This is most consistent with artifact (attenuation and apical thinning) but cannot rule out subtle scar. There is no significant ischemia. The left ventricular ejection fraction is normal (56%). There is no significant coronary artery calcification. Aortic atherosclerosis is noted on the attenuation correction CT. This is a low risk study.    EKG:  EKG is not ordered today.    Recent Labs: 02/24/2020: ALT 21 10/04/2020: BUN 17; Creatinine, Ser 1.00; Hemoglobin 13.8; Platelets 261; Potassium 4.0; Sodium 139; TSH 3.363  Recent Lipid Panel    Component Value Date/Time   CHOL 142 10/27/2019 1100   TRIG 99.0 10/27/2019 1100   HDL 40.70 10/27/2019 1100   CHOLHDL 3 10/27/2019 1100   VLDL 19.8 10/27/2019 1100   LDLCALC 82 10/27/2019 1100      Physical Exam:    VS:  BP 130/70 (BP Location: Left Arm, Patient Position: Sitting, Cuff Size: Normal)   Pulse 82   Ht  4\' 9"  (1.448 m)   Wt 180 lb (81.6 kg)   SpO2 97%   BMI 38.95 kg/m     Wt Readings from Last 3 Encounters:  10/21/20 180 lb (81.6 kg)  10/12/20 179 lb (81.2 kg)  10/04/20 179 lb (81.2 kg)     GEN:  Well nourished, well developed in no acute distress HEENT: Normal NECK: No JVD; No carotid bruits LYMPHATICS: No lymphadenopathy CARDIAC: RRR, no murmurs, rubs, gallops RESPIRATORY:  Clear to auscultation without rales, wheezing or rhonchi  ABDOMEN: Soft, non-tender, non-distended MUSCULOSKELETAL:  No edema; No deformity  SKIN: Warm and dry NEUROLOGIC:  Alert and oriented x 3 PSYCHIATRIC:  Normal affect   ASSESSMENT:    1. Essential hypertension   2. Chronic diastolic heart failure ([employer])   3. Chronic obstructive pulmonary disease, unspecified COPD type ([place])    PLAN:    In order of problems listed above:  HTN Patient is doing better since changing from [facility] to HCTZ. BP good today. Continue Losartan and HCTZ.   HFpEF Patient is euvolemic on exam. Echo from 02/2018 showed LVEF 60-65%, impaired relaxation. Continue Losartan. Continue low salt diet and compression socks as needed.   COPD Breathing is stable with no exacerbation.   Disposition: Follow up in 1 year(s) with MD/APP    Signed, Louvina Cleary Ninfa Meeker, PA-C  10/21/2020 4:00 PM    [facility]

## 2020-10-21 ENCOUNTER — Other Ambulatory Visit: Payer: Self-pay

## 2020-10-21 ENCOUNTER — Encounter: Payer: Self-pay | Admitting: Medical

## 2020-10-21 ENCOUNTER — Ambulatory Visit (INDEPENDENT_AMBULATORY_CARE_PROVIDER_SITE_OTHER): Payer: Medicare Other | Admitting: Medical

## 2020-10-21 ENCOUNTER — Other Ambulatory Visit: Payer: Self-pay | Admitting: Primary Care

## 2020-10-21 VITALS — BP 130/70 | HR 82 | Ht <= 58 in | Wt 180.0 lb

## 2020-10-21 DIAGNOSIS — K219 Gastro-esophageal reflux disease without esophagitis: Secondary | ICD-10-CM

## 2020-10-21 DIAGNOSIS — I10 Essential (primary) hypertension: Secondary | ICD-10-CM

## 2020-10-21 DIAGNOSIS — J449 Chronic obstructive pulmonary disease, unspecified: Secondary | ICD-10-CM

## 2020-10-21 DIAGNOSIS — I5032 Chronic diastolic (congestive) heart failure: Secondary | ICD-10-CM | POA: Diagnosis not present

## 2020-10-21 NOTE — Patient Instructions (Signed)
Medication Instructions:  No changes at this time.  *If you need a refill on your cardiac medications before your next appointment, please call your pharmacy*   Lab Work: None  If you have labs (blood work) drawn today and your tests are completely normal, you will receive your results only by: [place] (if you have MyChart) OR A paper copy in the mail If you have any lab test that is abnormal or we need to change your treatment, we will call you to review the results.   Testing/Procedures: None   Follow-Up: At [facility], you and your health needs are our priority.  As part of our continuing mission to provide you with exceptional heart care, we have created designated Provider Care Teams.  These Care Teams include your primary Cardiologist (physician) and Advanced Practice Providers (APPs -  Physician Assistants and Nurse Practitioners) who all work together to provide you with the care you need, when you need it.  Your next appointment:   1 year(s)  The format for your next appointment:   In Person  Provider:   You may see Nelva Bush, MD or one of the following Advanced Practice Providers on your designated Care Team:   Murray Hodgkins, NP Christell Faith, PA-C Marrianne Mood, PA-C Cadence [place], [place]

## 2020-10-25 ENCOUNTER — Ambulatory Visit: Payer: Medicare Other

## 2020-10-28 ENCOUNTER — Ambulatory Visit: Payer: Medicare Other

## 2020-10-31 ENCOUNTER — Other Ambulatory Visit: Payer: Medicare Other

## 2020-11-04 ENCOUNTER — Encounter: Payer: Medicare Other | Admitting: Primary Care

## 2020-11-14 ENCOUNTER — Other Ambulatory Visit: Payer: Self-pay

## 2020-11-14 MED ORDER — TRIAMCINOLONE ACETONIDE 0.1 % EX CREA: 1.0000 "application " | TOPICAL_CREAM | Freq: Two times a day (BID) | CUTANEOUS | 0 refills | Status: DC

## 2020-11-14 NOTE — Telephone Encounter (Signed)
Pt called for refill of Triamcinolone cream for her legs, enough until her appt here next month.  Ok Triamcinolone 0.1% cream erx'd to [place]

## 2020-11-15 NOTE — Telephone Encounter (Signed)
Provided note for patient to not wait extended periods to pick up her granddaughter from school

## 2020-11-30 ENCOUNTER — Other Ambulatory Visit: Payer: Self-pay | Admitting: Primary Care

## 2020-11-30 DIAGNOSIS — E038 Other specified hypothyroidism: Secondary | ICD-10-CM

## 2020-12-01 ENCOUNTER — Ambulatory Visit (INDEPENDENT_AMBULATORY_CARE_PROVIDER_SITE_OTHER): Payer: Medicare Other | Admitting: Dermatology

## 2020-12-01 ENCOUNTER — Other Ambulatory Visit: Payer: Self-pay

## 2020-12-01 DIAGNOSIS — I8311 Varicose veins of right lower extremity with inflammation: Secondary | ICD-10-CM | POA: Diagnosis not present

## 2020-12-01 DIAGNOSIS — I8312 Varicose veins of left lower extremity with inflammation: Secondary | ICD-10-CM

## 2020-12-01 DIAGNOSIS — I872 Venous insufficiency (chronic) (peripheral): Secondary | ICD-10-CM

## 2020-12-01 DIAGNOSIS — I781 Nevus, non-neoplastic: Secondary | ICD-10-CM | POA: Diagnosis not present

## 2020-12-01 MED ORDER — HYDROXYCHLOROQUINE SULFATE 200 MG PO TABS: 200.0000 mg | ORAL_TABLET | Freq: Two times a day (BID) | ORAL | 5 refills | Status: DC

## 2020-12-01 MED ORDER — TACROLIMUS 0.1 % EX OINT: TOPICAL_OINTMENT | CUTANEOUS | 3 refills | Status: DC

## 2020-12-01 NOTE — Patient Instructions (Addendum)
Stop the Triamcinolone 0.1% cream and start Tacrolimus to affected area of lower legs twice daily as needed for irritation and redness.   If You Need Anything After Your Visit  If you have any questions or concerns for your doctor, please call our main line at [phone] and press option 4 to reach your doctor's medical assistant. If no one answers, please leave a voicemail as directed and we will return your call as soon as possible. Messages left after 4 pm will be answered the following business day.   You may also send [place] a message via [place]. We typically respond to MyChart messages within 1-2 business days.  For prescription refills, please ask your pharmacy to contact our office. Our fax number is [phone].  If you have an urgent issue when the clinic is closed that cannot wait until the next business day, you can [place] your doctor at the number below.    Please note that while we do our best to be available for urgent issues outside of office hours, we are not available 24/7.   If you have an urgent issue and are unable to reach [place], you may choose to seek medical care at your doctor's office, retail clinic, urgent care center, or emergency room.  If you have a medical emergency, please immediately call 911 or go to the emergency department.  Pager Numbers  - Dr. Nehemiah Massed: [phone]  - Dr. Laurence Ferrari: [phone]  - Dr. Nicole Kindred: [phone]  In the event of inclement weather, please call our main line at [phone] for an update on the status of any delays or closures.  Dermatology Medication Tips: Please keep the boxes that topical medications come in in order to help keep track of the instructions about where and how to use these. Pharmacies typically print the medication instructions only on the boxes and not directly on the medication tubes.   If your medication is too expensive, please contact our office at [phone] option 4 or send [place] a message through [place].    We are unable to tell what your co-pay for medications will be in advance as this is different depending on your insurance coverage. However, we may be able to find a substitute medication at lower cost or fill out paperwork to get insurance to cover a needed medication.   If a prior authorization is required to get your medication covered by your insurance company, please allow [place] 1-2 business days to complete this process.  Drug prices often vary depending on where the prescription is filled and some pharmacies may offer cheaper prices.  The website www.goodrx.com contains coupons for medications through different pharmacies. The prices here do not account for what the cost may be with help from insurance (it may be cheaper with your insurance), but the website can give you the price if you did not use any insurance.  - You can print the associated coupon and take it with your prescription to the pharmacy.  - You may also stop by our office during regular business hours and pick up a GoodRx coupon card.  - If you need your prescription sent electronically to a different pharmacy, notify our office through [facility] or by phone at [phone] option 4.     Si Usted Necesita Algo Despus de Su Visita  Tambin puede enviarnos un mensaje a travs de Pharmacist, community. Por lo general respondemos a los mensajes de MyChart en el transcurso de 1 a 2 das hbiles.  Para renovar recetas,  por favor pida a su farmacia que se ponga en contacto con nuestra oficina. Harland Dingwall de fax es [place] [phone].  Si tiene un asunto urgente cuando la clnica est cerrada y que no puede esperar hasta el siguiente da hbil, puede llamar/localizar a su doctor(a) al nmero que aparece a continuacin.   Por favor, tenga en cuenta que aunque hacemos todo lo posible para estar disponibles para asuntos urgentes fuera del horario de [place], no estamos disponibles las 24 horas del da, los 7 das de la [place].    Si tiene un problema urgente y no puede comunicarse con nosotros, puede optar por buscar atencin mdica  en el consultorio de su doctor(a), en una clnica privada, en un centro de atencin urgente o en una sala de emergencias.  Si tiene Engineering geologist, por favor llame inmediatamente al 911 o vaya a la sala de emergencias.  Nmeros de bper  - Dr. Nehemiah Massed: [phone]  - Dra. Moye: [phone]  - Dra. Nicole Kindred: [phone]  En caso de inclemencias del [place], por favor llame a Johnsie Kindred principal al [phone] para una actualizacin sobre el [place] de cualquier retraso o cierre.  Consejos para la medicacin en dermatologa: Por favor, guarde las cajas en las que vienen los medicamentos de uso tpico para ayudarle a seguir las instrucciones sobre dnde y cmo usarlos. Las farmacias generalmente imprimen las instrucciones del medicamento slo en las cajas y no directamente en los tubos del [place].   Si su medicamento es muy caro, por favor, pngase en contacto con Zigmund Daniel llamando al [phone] y presione la opcin 4 o envenos un mensaje a travs de Pharmacist, community.   No podemos decirle cul ser su copago por los medicamentos por adelantado ya que esto es diferente dependiendo de la cobertura de su seguro. Sin embargo, es posible que podamos encontrar un medicamento sustituto a Electrical engineer un formulario para que el seguro cubra el medicamento que se considera necesario.   Si se requiere una autorizacin previa para que su compaa de seguros [place] su medicamento, por favor permtanos de 1 a 2 das hbiles para completar este proceso.  Los precios de los medicamentos varan con frecuencia dependiendo del Environmental consultant de dnde se surte la receta y alguna farmacias pueden ofrecer precios ms baratos.  El sitio web www.goodrx.com tiene cupones para medicamentos de Airline pilot. Los precios aqu no tienen en cuenta lo que podra costar con la ayuda del seguro  (puede ser ms barato con su seguro), pero el sitio web puede darle el precio si no utiliz Research scientist (physical sciences).  - Puede imprimir el cupn correspondiente y llevarlo con su receta a la farmacia.  - Tambin puede pasar por nuestra oficina durante el horario de atencin regular y Charity fundraiser una tarjeta de cupones de GoodRx.  - Si necesita que su receta se enve electrnicamente a una farmacia diferente, informe a nuestra oficina a travs de MyChart de [place] o por telfono llamando al [phone] y presione la opcin 4.

## 2020-12-01 NOTE — Progress Notes (Signed)
   Follow-Up Visit   Subjective  Teresa Jensen is a 75 y.o. female who presents for the following: Stasis dermatitis  (Of the B/L lower legs - with lipodermatosclerosis, patient currently taking Plaquenil 200mg  po BID and using TMC 0.1% cream BID PRN. Patient states that legs are occasionally painful and itchy. ).  The following portions of the chart were reviewed this encounter and updated as appropriate:   Tobacco  Allergies  Meds  Problems  Med Hx  Surg Hx  Fam Hx      Review of Systems:  No other skin or systemic complaints except as noted in HPI or Assessment and Plan.  Objective  Well appearing patient in no apparent distress; mood and affect are within normal limits.  A focused examination was performed including the legs. Relevant physical exam findings are noted in the Assessment and Plan.  B/L leg Mild pretibial erythema, some scarring and associated lower leg edema.   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 Currently well-controlled.  reviewed most recent labs CBC and BMP. Continue hydroxychloroquine 200 mg twice a day. Tolerating well.    Pt scheduled for eye exam early next year    If legs start to get itchy or sensitive, restart tacrolimus twice a day as needed. D/C TMC due to risk of skin atrophy.    Continue use of compression stockings   Recommend [place] Rapid Relief anti-itch cream TID PRN.    tacrolimus (PROTOPIC) 0.1 % ointment - B/L leg Apply to aa's lower legs BID PRN.  Related Medications hydroxychloroquine (PLAQUENIL) 200 MG tablet Take 1 tablet (200 mg total) by mouth 2 (two) times daily.  Return in about 6 months (around 06/01/2021) for stasis dermatitis with lipodermatosclerosis .  Varicose Veins/Spider Veins - Dilated blue, purple or red veins at the lower extremities - Reassured - Smaller vessels can be treated by  sclerotherapy (a procedure to inject a medicine into the veins to make them disappear) if desired, but the treatment is not covered by insurance. Larger vessels may be covered if symptomatic and we would refer to vascular surgeon if treatment desired.  Luther Redo, CMA, am acting as scribe for Forest Gleason, MD .  Documentation: I have reviewed the above documentation for accuracy and completeness, and I agree with the above.  Forest Gleason, MD

## 2020-12-05 ENCOUNTER — Encounter: Payer: Self-pay | Admitting: Dermatology

## 2020-12-05 NOTE — Telephone Encounter (Signed)
Is it possible to do a prior auth? We could also try [place] to see if better price? Can also decrease her to a 60 gram tube so it's not quit as much at once. Unfortunately [place] is more expensive.

## 2020-12-06 ENCOUNTER — Other Ambulatory Visit: Payer: Self-pay

## 2020-12-06 DIAGNOSIS — I872 Venous insufficiency (chronic) (peripheral): Secondary | ICD-10-CM

## 2020-12-06 MED ORDER — TACROLIMUS 0.1 % EX OINT
TOPICAL_OINTMENT | CUTANEOUS | 3 refills | Status: DC
Start: 2020-12-06 — End: 2022-05-17

## 2020-12-06 NOTE — Telephone Encounter (Signed)
Could you check Aleatha Borer price too and just let her know the cheapest of those 2 options? Thank you!

## 2020-12-07 ENCOUNTER — Encounter: Payer: Self-pay | Admitting: Dermatology

## 2020-12-20 ENCOUNTER — Encounter: Payer: Medicare Other | Admitting: Primary Care

## 2021-01-17 ENCOUNTER — Other Ambulatory Visit: Payer: Self-pay | Admitting: Primary Care

## 2021-01-17 DIAGNOSIS — K219 Gastro-esophageal reflux disease without esophagitis: Secondary | ICD-10-CM

## 2021-01-25 ENCOUNTER — Telehealth: Payer: Self-pay | Admitting: Dermatology

## 2021-01-25 NOTE — Telephone Encounter (Signed)
Provided medical note for jury duty as the long car rides and longer days sitting would be detrimental to her lipodermatosclerosis and stasis dermatitis.

## 2021-02-25 ENCOUNTER — Other Ambulatory Visit: Payer: Self-pay | Admitting: Primary Care

## 2021-02-25 DIAGNOSIS — E038 Other specified hypothyroidism: Secondary | ICD-10-CM

## 2021-02-27 NOTE — Telephone Encounter (Signed)
With Family Medicine Pleas Koch, NP)02/28/2021 at 10:20 AM

## 2021-02-28 ENCOUNTER — Other Ambulatory Visit: Payer: Self-pay

## 2021-02-28 ENCOUNTER — Encounter: Payer: Self-pay | Admitting: Primary Care

## 2021-02-28 ENCOUNTER — Ambulatory Visit (INDEPENDENT_AMBULATORY_CARE_PROVIDER_SITE_OTHER): Payer: Medicare Other | Admitting: Primary Care

## 2021-02-28 VITALS — BP 132/78 | HR 79 | Temp 98.6°F | Ht <= 58 in | Wt 182.0 lb

## 2021-02-28 DIAGNOSIS — E538 Deficiency of other specified B group vitamins: Secondary | ICD-10-CM

## 2021-02-28 DIAGNOSIS — M810 Age-related osteoporosis without current pathological fracture: Secondary | ICD-10-CM

## 2021-02-28 DIAGNOSIS — K219 Gastro-esophageal reflux disease without esophagitis: Secondary | ICD-10-CM

## 2021-02-28 DIAGNOSIS — I10 Essential (primary) hypertension: Secondary | ICD-10-CM | POA: Diagnosis not present

## 2021-02-28 DIAGNOSIS — R7303 Prediabetes: Secondary | ICD-10-CM

## 2021-02-28 DIAGNOSIS — F4321 Adjustment disorder with depressed mood: Secondary | ICD-10-CM

## 2021-02-28 DIAGNOSIS — F418 Other specified anxiety disorders: Secondary | ICD-10-CM

## 2021-02-28 DIAGNOSIS — J449 Chronic obstructive pulmonary disease, unspecified: Secondary | ICD-10-CM | POA: Diagnosis not present

## 2021-02-28 DIAGNOSIS — E038 Other specified hypothyroidism: Secondary | ICD-10-CM | POA: Diagnosis not present

## 2021-02-28 DIAGNOSIS — L539 Erythematous condition, unspecified: Secondary | ICD-10-CM

## 2021-02-28 LAB — LIPID PANEL
Cholesterol: 141 mg/dL (ref 0–200)
HDL: 52.4 mg/dL
LDL Cholesterol: 66 mg/dL (ref 0–99)
NonHDL: 88.58
Total CHOL/HDL Ratio: 3
Triglycerides: 114 mg/dL (ref 0.0–149.0)
VLDL: 22.8 mg/dL (ref 0.0–40.0)

## 2021-02-28 LAB — COMPREHENSIVE METABOLIC PANEL
ALT: 17 U/L (ref 0–35)
AST: 19 U/L (ref 0–37)
Albumin: 4 g/dL (ref 3.5–5.2)
Alkaline Phosphatase: 66 U/L (ref 39–117)
BUN: 25 mg/dL — ABNORMAL HIGH (ref 6–23)
CO2: 27 mEq/L (ref 19–32)
Calcium: 10.2 mg/dL (ref 8.4–10.5)
Chloride: 97 mEq/L (ref 96–112)
Creatinine, Ser: 1.19 mg/dL (ref 0.40–1.20)
GFR: 44.59 mL/min — ABNORMAL LOW (ref >60.00)
Glucose, Bld: 70 mg/dL (ref 70–99)
Potassium: 4.1 mEq/L (ref 3.5–5.1)
Sodium: 136 mEq/L (ref 135–145)
Total Bilirubin: 0.4 mg/dL (ref 0.2–1.2)
Total Protein: 7.5 g/dL (ref 6.0–8.3)

## 2021-02-28 LAB — CBC
HCT: 37.4 % (ref 36.0–46.0)
Hemoglobin: 12.4 g/dL (ref 12.0–15.0)
MCHC: 33.1 g/dL (ref 30.0–36.0)
MCV: 90.1 fl (ref 78.0–100.0)
Platelets: 320 K/uL (ref 150.0–400.0)
RBC: 4.15 Mil/uL (ref 3.87–5.11)
RDW: 13.9 % (ref 11.5–15.5)
WBC: 10.5 K/uL (ref 4.0–10.5)

## 2021-02-28 LAB — TSH: TSH: 4.21 u[IU]/mL (ref 0.35–5.50)

## 2021-02-28 LAB — HEMOGLOBIN A1C: Hgb A1c MFr Bld: 5.3 % (ref 4.6–6.5)

## 2021-02-28 LAB — VITAMIN B12: Vitamin B-12: 204 pg/mL — ABNORMAL LOW (ref 211–911)

## 2021-02-28 MED ORDER — BUDESONIDE-FORMOTEROL FUMARATE 160-4.5 MCG/ACT IN AERO
2.0000 | INHALATION_SPRAY | Freq: Two times a day (BID) | RESPIRATORY_TRACT | 11 refills | Status: DC
Start: 2021-02-28 — End: 2021-03-05

## 2021-02-28 MED ORDER — ALBUTEROL SULFATE HFA 108 (90 BASE) MCG/ACT IN AERS: 2.0000 | INHALATION_SPRAY | RESPIRATORY_TRACT | 0 refills | Status: DC | PRN

## 2021-02-28 NOTE — Assessment & Plan Note (Addendum)
Infrequent symptoms.   Pepcid ineffective, discussed to try omeprazole 20 mg OTC. No concerns today.

## 2021-02-28 NOTE — Assessment & Plan Note (Signed)
Frequent symptoms with exertion around her home. History of COPD diagnosed per pulmonology.   Treat with Rx for Symbicort 160-4.5 mcg, 2 puffs BID sent to pharmacy.  Rx for albuterol inhaler sent to pharmacy.   She will update if no improvement.

## 2021-02-28 NOTE — Assessment & Plan Note (Addendum)
Condolences provided given the loss of her sister.  I offered her treatment for ongoing depression/anxiety for which she kindly declines.  Continue alprazolam 0.25 mg as needed.  Unclear why she was summoned for jury duty as she exceeds the age limit.  Agreed to provide her excuse, she will message me on MyChart with her juror number.

## 2021-02-28 NOTE — Assessment & Plan Note (Signed)
Following with dermatology.  Continue hydroxychloroquine 200 mg 2 times daily. Continue tacrolimus 0.1% ointment as needed.

## 2021-02-28 NOTE — Assessment & Plan Note (Addendum)
She would like to resume alendronate to see if she can tolerate.  She has about 2 months worth at home and will start  Resume alendronate 70 mg weekly. She will notify if she tolerates well and needs a new prescription.  Bone density scan up-to-date. Continue calcium and vitamin D, encourage weightbearing exercise.

## 2021-02-28 NOTE — Progress Notes (Signed)
Subjective:    Patient ID: Teresa Jensen, female    DOB: [birth year], 76 y.o.   MRN: [id number]  HPI  Teresa Jensen is a very pleasant 75 y.o. female with a history of hypertension, CHF, COPD, hypothyroidism, osteoarthritis, osteoporosis, situational anxiety who presents today for follow-up of chronic conditions  1) Situational Anxiety: Chronic. Managed on alprazolam 0.25 mg for which she uses very sparingly.  Anxiety and depression has increased as she's lost several family members over the last year. Her brother is now under hospice care.   She is very nervous about serving [employer] duty, she was summoned to serve March 27th in [place]. She cannot drive to [facility], she cannot leave her husband alone for very long as she is his primary caregiver. She is also grieving the loss of her sister.   2) Hypertension: Currently managed on hydrochlorothiazide 12.5 mg daily, losartan 100 mg daily.  She denies chest pain, SOB, dizziness, headaches.   BP Readings from Last 3 Encounters:  02/28/21 132/78  10/21/20 130/70  10/04/20 (!) 148/55   3) Hypothyroidism: Currently managed on levothyroxine 75 mcg daily.  She is taking levothyroxine every morning on empty stomach with water only.  No food or other medicines for 30 minutes.  4) Osteoporosis: Currently managed on alendronate 70 mg weekly.  Last bone density scan was in March 2022.  She is not taking alendronate as it caused myalgias. She would like to try again.   5) COPD: Evaluated by pulmonology about 10 years ago, diagnosed with COPD. Chronic dyspnea that occurs with exertion around the house doing chores. Some days worse than others. She has never been prescribed daily treatment. She does not have an albuterol inhaler.    Immunizations: -Tetanus: 2010 -Influenza: Completed this season  -Covid-19: 2 vaccines  -Pneumonia: Prevnar 13 in 2016 - had allergic reaction  Mammogram: Completed in July 2022 Dexa: Completed in March  2022 Colonoscopy: Completed in 2013, due now. Declines given age.   BP Readings from Last 3 Encounters:  02/28/21 132/78  10/21/20 130/70  10/04/20 (!) 148/55      Review of Systems  Eyes:  Negative for visual disturbance.  Respiratory:  Positive for shortness of breath.   Cardiovascular:  Negative for chest pain.  Gastrointestinal:  Negative for constipation and diarrhea.  Neurological:  Negative for dizziness and headaches.  Psychiatric/Behavioral:  The patient is nervous/anxious.         Past Medical History:  Diagnosis Date   (HFpEF) heart failure with preserved ejection fraction (HCC)    Anxiety    Aortic atherosclerosis (HCC)    CAP (community acquired pneumonia)    COPD (chronic obstructive pulmonary disease) (HCC)    Dyspnea    DUE TO HEAT WITH COPD   Epistaxis 01/09/2017   GERD (gastroesophageal reflux disease)    OCC-NO MEDS   Grade I diastolic dysfunction    Hypertension    Hypothyroidism    Iliac artery stenosis, bilateral ([place])    New onset of headaches    Osteopenia    PMR (polymyalgia rheumatica) (HCC)    Pneumonia 02/13/2018   PONV (postoperative nausea and vomiting)    VERY NAUSEATED AFTER SURGERY   Shingles    Stasis dermatitis    Strain of chest wall 01/20/2011   Tobacco abuse     Social History   Socioeconomic History   Marital status: Married    Spouse name: [name]   Number of children: 1   Years  of education: Not on file   Highest education level: Not on file  Occupational History   Occupation: Therapist, art as a Therapist, art: RETIRED    Comment: retired  Tobacco Use   Smoking status: Former    Packs/day: 1.50    Years: 20.00    Pack years: 30.00    Types: Cigarettes    Quit date: 02/11/2009    Years since quitting: 12.0   Smokeless tobacco: Never  Vaping Use   Vaping Use: Never used  Substance and Sexual Activity   Alcohol use: No    Alcohol/week: 0.0 standard drinks   Drug use: No   Sexual activity: Not  Currently  Other Topics Concern   Not on file  Social History Narrative   Lives in [place] with her husband.  Takes care of her two grandchildren, ages 31 and 2, everyday in [place].  Has one son who is 36.   Desires CPR   Would not want prolonged life support if futile   Social Determinants of Health   Financial Resource Strain: Not on file  Food Insecurity: Not on file  Transportation Needs: Not on file  Physical Activity: Not on file  Stress: Not on file  Social Connections: Not on file  Intimate Partner Violence: Not on file    Past Surgical History:  Procedure Laterality Date   [place] Left    CARPAL TUNNEL RELEASE Left 08/31/2019   Procedure: CARPAL TUNNEL RELEASE;  Surgeon: Dereck Leep, MD;  Location: [facility] ORS;  Service: Orthopedics;  Laterality: Left;   CARPAL TUNNEL RELEASE Right 06/22/2020   Procedure: CARPAL TUNNEL RELEASE;  Surgeon: Dereck Leep, MD;  Location: [facility] OR[facility];  Service: Orthopedics;  Laterality: Right;   CERVICAL FUSION     CHOLECYSTECTOMY     HEMORRHOID SURGERY     LUMBAR FUSION     TUBAL LIGATION      Family History  Problem Relation Age of Onset   Heart attack Mother 22   Cancer Father        lung/chest wall    Prostate cancer Father    Diabetes Brother        siblings   Hypertension Sister        x 2   Heart disease Brother    Breast cancer Paternal Grandmother    Heart disease Sister    Diabetes Sister    Tuberculosis Sister    Cirrhosis Brother     Allergies  Allergen Reactions   Epinephrine Anaphylaxis and Shortness Of Breath   Food Rash    MANGO = cause rashes around the mouth LIPS GO NUMB   Mangifera [employer] Rash    THIS IS MANGO LIPS GO NUMB   Other Rash    MANGO = cause rashes around the mouth MANGO = cause rashes around the mouth LIPS GO NUMB   Prednisone Anaphylaxis, Hives, Shortness Of Breath, Itching and Swelling     The face swells HAS TAKEN CORTISONE SHOTS WITHOUT ISSUE BEFORE   Milk-Related Compounds Diarrhea    DAIRY PRODUCTS ARE A PROBLEM   Pneumococcal 13-Val Conj Vacc Itching and Other (See Comments)    Arm temp elevated at site of injection, red, itching   Prevnar 13 [Pneumococcal 13-Val Conj Vacc] Other (See Comments)    Arm temp elevated at site of injection, red,  itching    Current Outpatient Medications on File Prior to Visit  Medication Sig Dispense Refill   Calcium Carb-Cholecalciferol (CALCIUM + VITAMIN D3 PO) Take 1,200 mg by mouth daily.     hydrochlorothiazide (MICROZIDE) 12.5 MG capsule Take 1 capsule (12.5 mg total) by mouth daily. 90 capsule 3   hydroxychloroquine (PLAQUENIL) 200 MG tablet Take 1 tablet (200 mg total) by mouth 2 (two) times daily. 60 tablet 5   levothyroxine (SYNTHROID) 75 MCG tablet TAKE 1 TABLET BY MOUTH EVERY MORNING ON AN EMPTY STOMACH WITH WATER ONLY. NO FOOD OR OTHER MEDICATIONS FOR 30 MINUTES. 90 tablet 0   losartan (COZAAR) 50 MG tablet Take 2 tablets (100 mg total) by mouth daily. 180 tablet 3   Multiple Vitamins-Minerals (PRESERVISION AREDS 2 PO) Take 1 tablet by mouth in the morning and at bedtime.     tacrolimus (PROTOPIC) 0.1 % ointment Apply to aa's lower legs BID PRN. 60 g 3   alendronate (FOSAMAX) 70 MG tablet Take 1 tablet (70 mg total) by mouth every 7 (seven) days. Take with a full glass of water on an empty stomach. (Patient not taking: Reported on 02/28/2021) 12 tablet 2   ALPRAZolam (XANAX) 0.25 MG tablet Take 0.5 tablets (0.125 mg total) by mouth daily as needed for anxiety. (Patient not taking: Reported on 10/21/2020)     No current facility-administered medications on file prior to visit.    BP 132/78    Pulse 79    Temp 98.6 F (37 C) (Oral)    Ht 4\' 9"  (1.448 m)    Wt 182 lb (82.6 kg)    SpO2 95%    BMI 39.38 kg/m  Objective:   Physical Exam Cardiovascular:     Rate and Rhythm: Normal rate and regular rhythm.  Pulmonary:      Effort: Pulmonary effort is normal.     Breath sounds: Normal breath sounds.  Musculoskeletal:     Cervical back: Neck supple.  Skin:    General: Skin is warm and dry.  Psychiatric:        Mood and Affect: Mood normal.          Assessment & Plan:      This visit occurred during the SARS-CoV-2 public health emergency.  Safety protocols were in place, including screening questions prior to the visit, additional usage of staff PPE, and extensive cleaning of exam room while observing appropriate contact time as indicated for disinfecting solutions.

## 2021-02-28 NOTE — Assessment & Plan Note (Signed)
Controlled.  Continue hydrochlorothiazide 12.5 mg daily, losartan 100 mg daily.  CMP pending.

## 2021-02-28 NOTE — Assessment & Plan Note (Signed)
Condolences provided given the loss of her sister. She declines any therapy or resources.

## 2021-02-28 NOTE — Assessment & Plan Note (Signed)
She is taking levothyroxine correctly.  Continue levothyroxine 75 mcg. Repeat TSH pending.

## 2021-02-28 NOTE — Patient Instructions (Signed)
Start budesonide-formoterol (Symbicort) inhaler for shortness of breath.  Inhale 2 puffs into the lungs twice daily, every day.  Use the albuterol inhaler only if needed for increased shortness of breath.  Resume the alendronate (Fosamax) bone density medicine once weekly.  Notify me if you can tolerate and would like for me to renew the prescription.  Find out if Medicare will cover the Cologuard test.  Please message me your juror number so that I can provide you with an excuse.  It was a pleasure to see you today!

## 2021-02-28 NOTE — Assessment & Plan Note (Signed)
Repeat A1c pending. 

## 2021-03-03 DIAGNOSIS — I10 Essential (primary) hypertension: Secondary | ICD-10-CM

## 2021-03-03 DIAGNOSIS — J449 Chronic obstructive pulmonary disease, unspecified: Secondary | ICD-10-CM

## 2021-03-05 MED ORDER — FLUTICASONE-SALMETEROL 250-50 MCG/ACT IN AEPB: 1.0000 | INHALATION_SPRAY | Freq: Two times a day (BID) | RESPIRATORY_TRACT | 0 refills | Status: DC

## 2021-03-09 MED ORDER — AMLODIPINE BESYLATE 5 MG PO TABS: 5.0000 mg | ORAL_TABLET | Freq: Every day | ORAL | 0 refills | Status: DC

## 2021-03-14 ENCOUNTER — Encounter: Payer: Medicare Other | Admitting: Primary Care

## 2021-04-06 ENCOUNTER — Encounter: Payer: Self-pay | Admitting: Primary Care

## 2021-04-06 ENCOUNTER — Ambulatory Visit (INDEPENDENT_AMBULATORY_CARE_PROVIDER_SITE_OTHER): Payer: Medicare Other | Admitting: Primary Care

## 2021-04-06 VITALS — BP 126/74 | HR 92 | Ht <= 58 in | Wt 182.2 lb

## 2021-04-06 DIAGNOSIS — I10 Essential (primary) hypertension: Secondary | ICD-10-CM

## 2021-04-06 LAB — BASIC METABOLIC PANEL
BUN: 15 mg/dL (ref 6–23)
CO2: 30 mEq/L (ref 19–32)
Calcium: 9.1 mg/dL (ref 8.4–10.5)
Chloride: 101 mEq/L (ref 96–112)
Creatinine, Ser: 0.92 mg/dL (ref 0.40–1.20)
GFR: 60.68 mL/min (ref >60.00)
Glucose, Bld: 66 mg/dL — ABNORMAL LOW (ref 70–99)
Potassium: 4.4 mEq/L (ref 3.5–5.1)
Sodium: 139 mEq/L (ref 135–145)

## 2021-04-06 NOTE — Patient Instructions (Signed)
Continue amlodipine 5 mg daily and losartan 100 mg daily for blood pressure. ? ?Stop by the lab prior to leaving today. I will notify you of your results once received.  ? ?It was a pleasure to see you today! ? ?

## 2021-04-06 NOTE — Assessment & Plan Note (Signed)
Controlled. ? ?Continue amlodipine 5 mg daily, losartan 100 mg daily. ?Remain off of hydrochlorothiazide. ? ?BMP pending. ?

## 2021-04-06 NOTE — Progress Notes (Signed)
? ?Subjective:  ? ? Patient ID: Teresa Jensen, female    DOB: 1945-11-06, 76 y.o.   MRN: [id number] ? ?HPI ? ?Teresa Jensen is a very pleasant 76 y.o. female with a history of hypertension, CHF, COPD, hypothyroidism, situational anxiety who presents today for follow-up of hypertension. ? ?She was last evaluated on 02/28/2021 for her routine follow-up care.  After her visit her labs continue to reveal CKD stage III so we decided to discontinue her hydrochlorothiazide and initiate amlodipine 5 mg daily.  We continued her losartan 100 mg daily.  She was asked to follow-up today. ? ?Since her last visit she's compliant to her amlodipine 5 mg daily and losartan 100 mg daily. She denies ankle edema, dizziness, headaches.  ? ?BP Readings from Last 3 Encounters:  ?04/06/21 126/74  ?02/28/21 132/78  ?10/21/20 130/70  ? ? ? ? ? ?Review of Systems  ?Respiratory:  Negative for shortness of breath.   ?Cardiovascular:  Negative for chest pain.  ?Neurological:  Negative for dizziness and headaches.  ? ?   ? ? ?Past Medical History:  ?Diagnosis Date  ? (HFpEF) heart failure with preserved ejection fraction ([place])   ? Anxiety   ? Aortic atherosclerosis ([place])   ? CAP (community acquired pneumonia)   ? COPD (chronic obstructive pulmonary disease) ([place])   ? Dyspnea   ? DUE TO HEAT WITH COPD  ? Epistaxis 01/09/2017  ? GERD (gastroesophageal reflux disease)   ? OCC-NO MEDS  ? Grade I diastolic dysfunction   ? Hypertension   ? Hypothyroidism   ? Iliac artery stenosis, bilateral (HCC)   ? New onset of headaches   ? Osteopenia   ? PMR (polymyalgia rheumatica) (HCC)   ? Pneumonia 02/13/2018  ? PONV (postoperative nausea and vomiting)   ? VERY NAUSEATED AFTER SURGERY  ? Shingles   ? Stasis dermatitis   ? Strain of chest wall 01/20/2011  ? Tobacco abuse   ? ? ?Social History  ? ?Socioeconomic History  ? Marital status: Married  ?  Spouse name: [name]  ? Number of children: 1  ? Years of education: Not on file  ? Highest education level: Not  on file  ?Occupational History  ? Occupation: Therapist, art as a Forensic psychologist  ?  Employer: RETIRED  ?  Comment: retired  ?Tobacco Use  ? Smoking status: Former  ?  Packs/day: 1.50  ?  Years: 20.00  ?  Pack years: 30.00  ?  Types: Cigarettes  ?  Quit date: 02/11/2009  ?  Years since quitting: 12.1  ? Smokeless tobacco: Never  ?Vaping Use  ? Vaping Use: Never used  ?Substance and Sexual Activity  ? Alcohol use: No  ?  Alcohol/week: 0.0 standard drinks  ? Drug use: No  ? Sexual activity: Not Currently  ?Other Topics Concern  ? Not on file  ?Social History Narrative  ? Lives in [place] with her husband.  Takes care of her two grandchildren, ages 4 and 2, everyday in [place].  Has one son who is 25.  ? Desires CPR  ? Would not want prolonged life support if futile  ? ?Social Determinants of Health  ? ?Financial Resource Strain: Not on file  ?Food Insecurity: Not on file  ?Transportation Needs: Not on file  ?Physical Activity: Not on file  ?Stress: Not on file  ?Social Connections: Not on file  ?Intimate Partner Violence: Not on file  ? ? ?Past Surgical History:  ?Procedure Laterality Date  ?  ABDOMINAL HYSTERECTOMY    ? APPENDECTOMY    ? BACK SURGERY    ? LUMBAR FUSION  ? CARPAL TUNNEL RELEASE Left   ? CARPAL TUNNEL RELEASE Left 08/31/2019  ? Procedure: CARPAL TUNNEL RELEASE;  Surgeon: Dereck Leep, MD;  Location: [facility] OR[facility];  Service: Orthopedics;  Laterality: Left;  ? CARPAL TUNNEL RELEASE Right 06/22/2020  ? Procedure: CARPAL TUNNEL RELEASE;  Surgeon: Dereck Leep, MD;  Location: [facility];  Service: Orthopedics;  Laterality: Right;  ? CERVICAL FUSION    ? CHOLECYSTECTOMY    ? HEMORRHOID SURGERY    ? LUMBAR FUSION    ? TUBAL LIGATION    ? ? ?Family History  ?Problem Relation Age of Onset  ? Heart attack Mother 31  ? Cancer Father   ?     lung/chest wall   ? Prostate cancer Father   ? Diabetes Brother   ?     siblings  ? Hypertension Sister   ?     x 2  ? Heart disease Brother   ? Breast cancer Paternal  Grandmother   ? Heart disease Sister   ? Diabetes Sister   ? Tuberculosis Sister   ? Cirrhosis Brother   ? ? ?Allergies  ?Allergen Reactions  ? Epinephrine Anaphylaxis and Shortness Of Breath  ? Food Rash  ?  MANGO = cause rashes around the mouth ?LIPS GO NUMB  ? Mangifera Indica Rash  ?  THIS IS MANGO ?LIPS GO NUMB  ? Other Rash  ?  MANGO = cause rashes around the mouth ?MANGO = cause rashes around the mouth ?LIPS GO NUMB  ? Prednisone Anaphylaxis, Hives, Shortness Of Breath, Itching and Swelling  ?  The face swells ?HAS TAKEN CORTISONE SHOTS WITHOUT ISSUE BEFORE  ? Milk-Related Compounds Diarrhea  ?  DAIRY PRODUCTS ARE A PROBLEM  ? Pneumococcal 13-Val Conj Vacc Itching and Other (See Comments)  ?  Arm temp elevated at site of injection, red, itching  ? Prevnar 13 [Pneumococcal 13-Val Conj Vacc] Other (See Comments)  ?  Arm temp elevated at site of injection, red, itching  ? ? ?Current Outpatient Medications on File Prior to Visit  ?Medication Sig Dispense Refill  ? albuterol (VENTOLIN HFA) 108 (90 Base) MCG/ACT inhaler Inhale 2 puffs into the lungs every 4 (four) hours as needed for shortness of breath. 1 each 0  ? alendronate (FOSAMAX) 70 MG tablet Take 1 tablet (70 mg total) by mouth every 7 (seven) days. Take with a full glass of water on an empty stomach. 12 tablet 2  ? ALPRAZolam (XANAX) 0.25 MG tablet Take 0.5 tablets (0.125 mg total) by mouth daily as needed for anxiety.    ? amLODipine (NORVASC) 5 MG tablet Take 1 tablet (5 mg total) by mouth daily. For blood pressure. 90 tablet 0  ? Calcium Carb-Cholecalciferol (CALCIUM + VITAMIN D3 PO) Take 1,200 mg by mouth daily.    ? fluticasone-salmeterol (ADVAIR DISKUS) 250-50 MCG/ACT AEPB Inhale 1 puff into the lungs in the morning and at bedtime. 1 each 0  ? hydroxychloroquine (PLAQUENIL) 200 MG tablet Take 1 tablet (200 mg total) by mouth 2 (two) times daily. 60 tablet 5  ? levothyroxine (SYNTHROID) 75 MCG tablet TAKE 1 TABLET BY MOUTH EVERY MORNING ON AN EMPTY  STOMACH WITH WATER ONLY. NO FOOD OR OTHER MEDICATIONS FOR 30 MINUTES. 90 tablet 3  ? losartan (COZAAR) 50 MG tablet Take 2 tablets (100 mg total) by mouth daily. 180 tablet 3  ?  Multiple Vitamins-Minerals (PRESERVISION AREDS 2 PO) Take 1 tablet by mouth in the morning and at bedtime.    ? tacrolimus (PROTOPIC) 0.1 % ointment Apply to aa's lower legs BID PRN. 60 g 3  ? ?No current facility-administered medications on file prior to visit.  ? ? ?BP 126/74   Pulse 92   Ht 4\' 9"  (1.448 m)   Wt 182 lb 3.2 oz (82.6 kg)   SpO2 93%   BMI 39.43 kg/m?  ?Objective:  ? Physical Exam ?Cardiovascular:  ?   Rate and Rhythm: Normal rate and regular rhythm.  ?Pulmonary:  ?   Effort: Pulmonary effort is normal.  ?   Breath sounds: Normal breath sounds.  ?Musculoskeletal:  ?   Cervical back: Neck supple.  ?Skin: ?   General: Skin is warm and dry.  ? ? ? ? ? ?   ?Assessment & Plan:  ? ? ? ? ?This visit occurred during the SARS-CoV-2 public health emergency.  Safety protocols were in place, including screening questions prior to the visit, additional usage of staff PPE, and extensive cleaning of exam room while observing appropriate contact time as indicated for disinfecting solutions.  ?

## 2021-04-10 ENCOUNTER — Other Ambulatory Visit: Payer: Self-pay | Admitting: Primary Care

## 2021-04-10 DIAGNOSIS — K219 Gastro-esophageal reflux disease without esophagitis: Secondary | ICD-10-CM

## 2021-05-01 ENCOUNTER — Ambulatory Visit (INDEPENDENT_AMBULATORY_CARE_PROVIDER_SITE_OTHER): Payer: Medicare Other

## 2021-05-01 VITALS — Ht <= 58 in | Wt 182.0 lb

## 2021-05-01 DIAGNOSIS — K2900 Acute gastritis without bleeding: Secondary | ICD-10-CM | POA: Insufficient documentation

## 2021-05-01 DIAGNOSIS — Z0000 Encounter for general adult medical examination without abnormal findings: Secondary | ICD-10-CM | POA: Diagnosis not present

## 2021-05-01 DIAGNOSIS — E66812 Obesity, class 2: Secondary | ICD-10-CM | POA: Insufficient documentation

## 2021-05-01 NOTE — Patient Instructions (Signed)
Teresa Jensen , ?Thank you for taking time to come for your Medicare Wellness Visit. I appreciate your ongoing commitment to your health goals. Please review the following plan we discussed and let me know if I can assist you in the future.  ? ?Screening recommendations/referrals: ?Colonoscopy: Done 06/29/2011. No longer required.  ?Mammogram: Done 07/11/2020. Repeat annually ? ?Bone Density: Done 03/10/2020. Repeat every 2 years ? ?Recommended yearly ophthalmology/optometry visit for glaucoma screening and checkup ?Recommended yearly dental visit for hygiene and checkup ? ?Vaccinations: ?Influenza vaccine: Done 09/02/2020 Repeat annually ? ?Pneumococcal vaccine: Done 10/12/2014. Second dose due at your convenience.  ?Tdap vaccine: Done 10/01/2008 Repeat in 10 years ? ?Shingles vaccine: Discussed. ?   ?Covid-19:Done 03/11/2019 and 04/01/2019 ? ?Advanced directives: Please bring a copy of your health care power of attorney and living will to the office to be added to your chart at your convenience. ? ? ?Conditions/risks identified: KEEP UP THE GOOD WORK!! ? ?Next appointment: Follow up in one year for your annual wellness visit 05/07/2022 @ 2 PM. ? ? ?Preventive Care 97 Years and Older, Female ?Preventive care refers to lifestyle choices and visits with your health care provider that can promote health and wellness. ?What does preventive care include? ?A yearly physical exam. This is also called an annual well check. ?Dental exams once or twice a year. ?Routine eye exams. Ask your health care provider how often you should have your eyes checked. ?Personal lifestyle choices, including: ?Daily care of your teeth and gums. ?Regular physical activity. ?Eating a healthy diet. ?Avoiding tobacco and drug use. ?Limiting alcohol use. ?Practicing safe sex. ?Taking low-dose aspirin every day. ?Taking vitamin and mineral supplements as recommended by your health care provider. ?What happens during an annual well check? ?The services and  screenings done by your health care provider during your annual well check will depend on your age, overall health, lifestyle risk factors, and family history of disease. ?Counseling  ?Your health care provider may ask you questions about your: ?Alcohol use. ?Tobacco use. ?Drug use. ?Emotional well-being. ?Home and relationship well-being. ?Sexual activity. ?Eating habits. ?History of falls. ?Memory and ability to understand (cognition). ?Work and work Statistician. ?Reproductive health. ?Screening  ?You may have the following tests or measurements: ?Height, weight, and BMI. ?Blood pressure. ?Lipid and cholesterol levels. These may be checked every 5 years, or more frequently if you are over 68 years old. ?Skin check. ?Lung cancer screening. You may have this screening every year starting at age 49 if you have a 30-pack-year history of smoking and currently smoke or have quit within the past 15 years. ?Fecal occult blood test (FOBT) of the stool. You may have this test every year starting at age 40. ?Flexible sigmoidoscopy or colonoscopy. You may have a sigmoidoscopy every 5 years or a colonoscopy every 10 years starting at age 1. ?Hepatitis C blood test. ?Hepatitis B blood test. ?Sexually transmitted disease (STD) testing. ?Diabetes screening. This is done by checking your blood sugar (glucose) after you have not eaten for a while (fasting). You may have this done every 1-3 years. ?Bone density scan. This is done to screen for osteoporosis. You may have this done starting at age 31. ?Mammogram. This may be done every 1-2 years. Talk to your health care provider about how often you should have regular mammograms. ?Talk with your health care provider about your test results, treatment options, and if necessary, the need for more tests. ?Vaccines  ?Your health care provider may recommend certain vaccines,  such as: ?Influenza vaccine. This is recommended every year. ?Tetanus, diphtheria, and acellular pertussis (Tdap,  Td) vaccine. You may need a Td booster every 10 years. ?Zoster vaccine. You may need this after age 24. ?Pneumococcal 13-valent conjugate (PCV13) vaccine. One dose is recommended after age 28. ?Pneumococcal polysaccharide (PPSV23) vaccine. One dose is recommended after age 9. ?Talk to your health care provider about which screenings and vaccines you need and how often you need them. ?This information is not intended to replace advice given to you by your health care provider. Make sure you discuss any questions you have with your health care provider. ?Document Released: 01/14/2015 Document Revised: 09/07/2015 Document Reviewed: 10/19/2014 ?Elsevier Interactive Patient Education ? 2017 [place]. ? ?Fall Prevention in the Home ?Falls can cause injuries. They can happen to people of all ages. There are many things you can do to make your home safe and to help prevent falls. ?What can I do on the outside of my home? ?Regularly fix the edges of walkways and driveways and fix any cracks. ?Remove anything that might make you trip as you walk through a door, such as a raised step or threshold. ?Trim any bushes or trees on the path to your home. ?Use bright outdoor lighting. ?Clear any walking paths of anything that might make someone trip, such as rocks or tools. ?Regularly check to see if handrails are loose or broken. Make sure that both sides of any steps have handrails. ?Any raised decks and porches should have guardrails on the edges. ?Have any leaves, snow, or ice cleared regularly. ?Use sand or salt on walking paths during winter. ?Clean up any spills in your garage right away. This includes oil or grease spills. ?What can I do in the bathroom? ?Use night lights. ?Install grab bars by the toilet and in the tub and shower. Do not use towel bars as grab bars. ?Use non-skid mats or decals in the tub or shower. ?If you need to sit down in the shower, use a plastic, non-slip stool. ?Keep the floor dry. Clean up any  water that spills on the floor as soon as it happens. ?Remove soap buildup in the tub or shower regularly. ?Attach bath mats securely with double-sided non-slip rug tape. ?Do not have throw rugs and other things on the floor that can make you trip. ?What can I do in the bedroom? ?Use night lights. ?Make sure that you have a light by your bed that is easy to reach. ?Do not use any sheets or blankets that are too big for your bed. They should not hang down onto the floor. ?Have a firm chair that has side arms. You can use this for support while you get dressed. ?Do not have throw rugs and other things on the floor that can make you trip. ?What can I do in the kitchen? ?Clean up any spills right away. ?Avoid walking on wet floors. ?Keep items that you use a lot in easy-to-reach places. ?If you need to reach something above you, use a strong step stool that has a grab bar. ?Keep electrical cords out of the way. ?Do not use floor polish or wax that makes floors slippery. If you must use wax, use non-skid floor wax. ?Do not have throw rugs and other things on the floor that can make you trip. ?What can I do with my stairs? ?Do not leave any items on the stairs. ?Make sure that there are handrails on both sides of the stairs and  use them. Fix handrails that are broken or loose. Make sure that handrails are as long as the stairways. ?Check any carpeting to make sure that it is firmly attached to the stairs. Fix any carpet that is loose or worn. ?Avoid having throw rugs at the top or bottom of the stairs. If you do have throw rugs, attach them to the floor with carpet tape. ?Make sure that you have a light switch at the top of the stairs and the bottom of the stairs. If you do not have them, ask someone to add them for you. ?What else can I do to help prevent falls? ?Wear shoes that: ?Do not have high heels. ?Have rubber bottoms. ?Are comfortable and fit you well. ?Are closed at the toe. Do not wear sandals. ?If you use a  stepladder: ?Make sure that it is fully opened. Do not climb a closed stepladder. ?Make sure that both sides of the stepladder are locked into place. ?Ask someone to hold it for you, if possible. ?Clearly mar

## 2021-05-01 NOTE — Progress Notes (Signed)
? ?Subjective:  ? Teresa Jensen is a 76 y.o. female who presents for Medicare Annual (Subsequent) preventive examination. ?Virtual Visit via Telephone Note ? ?I connected with  Teresa Jensen on 05/01/21 at  2:00 PM EDT by telephone and verified that I am speaking with the correct person using two identifiers. ? ?Location: ?Patient: HOME ?Provider: [facility] ?Persons participating in the virtual visit: patient/Nurse Health Advisor ?  ?I discussed the limitations, risks, security and privacy concerns of performing an evaluation and management service by telephone and the availability of in person appointments. The patient expressed understanding and agreed to proceed. ? ?Interactive audio and video telecommunications were attempted between this nurse and patient, however failed, due to patient having technical difficulties OR patient did not have access to video capability.  We continued and completed visit with audio only. ? ?Some vital signs may be absent or patient reported.  ? ?Chriss Driver, LPN ? ?Review of Systems    ? ?Cardiac Risk Factors include: advanced age (>70men, >42 women);hypertension;dyslipidemia;sedentary lifestyle;obesity (BMI >30kg/m2) ? ?   ?Objective:  ?  ?Today's Vitals  ? 05/01/21 1405  ?Weight: 182 lb (82.6 kg)  ?Height: 4\' 9"  (1.448 m)  ? ?Body mass index is 39.38 kg/m?. ? ? ?  05/01/2021  ?  2:24 PM 10/04/2020  ?  8:53 AM 06/22/2020  ?  9:47 AM 06/17/2020  ? 10:18 AM 08/25/2019  ? 11:21 AM 02/13/2018  ?  8:00 PM 02/13/2018  ?  3:11 PM  ?Advanced Directives  ?Does Patient Have a Medical Advance Directive? No No No No No No No  ?Would patient like information on creating a medical advance directive? No - Patient declined No - Patient declined No - Patient declined No - Patient declined  Yes (Inpatient - patient requests chaplain consult to create a medical advance directive)   ? ? ?Current Medications (verified) ?Outpatient Encounter Medications as of 05/01/2021  ?Medication Sig  ? albuterol  (VENTOLIN HFA) 108 (90 Base) MCG/ACT inhaler Inhale 2 puffs into the lungs every 4 (four) hours as needed for shortness of breath.  ? alendronate (FOSAMAX) 70 MG tablet Take 1 tablet (70 mg total) by mouth every 7 (seven) days. Take with a full glass of water on an empty stomach.  ? ALPRAZolam (XANAX) 0.25 MG tablet Take 0.5 tablets (0.125 mg total) by mouth daily as needed for anxiety.  ? amLODipine (NORVASC) 5 MG tablet Take 1 tablet (5 mg total) by mouth daily. For blood pressure.  ? Calcium Carb-Cholecalciferol (CALCIUM + VITAMIN D3 PO) Take 1,200 mg by mouth daily.  ? fluticasone-salmeterol (ADVAIR DISKUS) 250-50 MCG/ACT AEPB Inhale 1 puff into the lungs in the morning and at bedtime.  ? hydroxychloroquine (PLAQUENIL) 200 MG tablet Take 1 tablet (200 mg total) by mouth 2 (two) times daily.  ? levothyroxine (SYNTHROID) 75 MCG tablet TAKE 1 TABLET BY MOUTH EVERY MORNING ON AN EMPTY STOMACH WITH WATER ONLY. NO FOOD OR OTHER MEDICATIONS FOR 30 MINUTES.  ? losartan (COZAAR) 50 MG tablet Take 2 tablets (100 mg total) by mouth daily.  ? Multiple Vitamins-Minerals (PRESERVISION AREDS 2 PO) Take 1 tablet by mouth in the morning and at bedtime.  ? tacrolimus (PROTOPIC) 0.1 % ointment Apply to aa's lower legs BID PRN.  ? ?No facility-administered encounter medications on file as of 05/01/2021.  ? ? ?Allergies (verified) ?Epinephrine, Food, Mangifera indica, Other, Prednisone, Milk-related compounds, Pneumococcal 13-val conj vacc, and Prevnar 13 [pneumococcal 13-val conj vacc]  ? ?History: ?  Past Medical History:  ?Diagnosis Date  ? (HFpEF) heart failure with preserved ejection fraction ([place])   ? Anxiety   ? Aortic atherosclerosis ([place])   ? CAP (community acquired pneumonia)   ? COPD (chronic obstructive pulmonary disease) ([place])   ? Dyspnea   ? DUE TO HEAT WITH COPD  ? Epistaxis 01/09/2017  ? GERD (gastroesophageal reflux disease)   ? OCC-NO MEDS  ? Grade I diastolic dysfunction   ? Hypertension   ? Hypothyroidism   ? 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 New onset of headaches   ? Osteopenia   ? PMR (polymyalgia rheumatica) ([facility])   ? Pneumonia 02/13/2018  ? PONV (postoperative nausea and vomiting)   ? VERY NAUSEATED AFTER SURGERY  ? Shingles   ? Stasis dermatitis   ? Strain of chest wall 01/20/2011  ? Tobacco abuse   ? ?Past Surgical History:  ?Procedure Laterality Date  ? ABDOMINAL HYSTERECTOMY    ? APPENDECTOMY    ? BACK SURGERY    ? LUMBAR FUSION  ? CARPAL TUNNEL RELEASE Left   ? CARPAL TUNNEL RELEASE Left 08/31/2019  ? Procedure: CARPAL TUNNEL RELEASE;  Surgeon: Dereck Leep, MD;  Location: [facility] ORS;  Service: Orthopedics;  Laterality: Left;  ? CARPAL TUNNEL RELEASE Right 06/22/2020  ? Procedure: CARPAL TUNNEL RELEASE;  Surgeon: Dereck Leep, MD;  Location: [facility] OR[facility];  Service: Orthopedics;  Laterality: Right;  ? CERVICAL FUSION    ? CHOLECYSTECTOMY    ? HEMORRHOID SURGERY    ? LUMBAR FUSION    ? TUBAL LIGATION    ? ?Family History  ?Problem Relation Age of Onset  ? Heart attack Mother 79  ? Cancer Father   ?     lung/chest wall   ? Prostate cancer Father   ? Diabetes Brother   ?     siblings  ? Hypertension Sister   ?     x 2  ? Heart disease Brother   ? Breast cancer Paternal Grandmother   ? Heart disease Sister   ? Diabetes Sister   ? Tuberculosis Sister   ? Cirrhosis Brother   ? ?Social History  ? ?Socioeconomic History  ? Marital status: Married  ?  Spouse name: [name]  ? Number of children: 1  ? Years of education: Not on file  ? Highest education level: Not on file  ?Occupational History  ? Occupation: Therapist, art as a Forensic psychologist  ?  Employer: RETIRED  ?  Comment: retired  ?Tobacco Use  ? Smoking status: Former  ?  Packs/day: 1.50  ?  Years: 20.00  ?  Pack years: 30.00  ?  Types: Cigarettes  ?  Quit date: 02/11/2009  ?  Years since quitting: 12.2  ? Smokeless tobacco: Never  ?Vaping Use  ? Vaping Use: Never used  ?Substance and Sexual Activity  ? Alcohol use: No  ?  Alcohol/week: 0.0 standard drinks  ? Drug  use: No  ? Sexual activity: Not Currently  ?Other Topics Concern  ? Not on file  ?Social History Narrative  ? Lives in [place] with her husband.  Takes care of her two grandchildren, ages 55 and 2, everyday in [place].  Has one son who is 49.  ? Desires CPR  ? Would not want prolonged life support if futile  ? ?Social Determinants of Health  ? ?Financial Resource Strain: Low Risk   ? Difficulty of Paying Living Expenses: Not hard at all  ?Food  Insecurity: No Food Insecurity  ? Worried About Charity fundraiser in the Last Year: Never true  ? Ran Out of Food in the Last Year: Never true  ?Transportation Needs: No Transportation Needs  ? Lack of Transportation (Medical): No  ? Lack of Transportation (Non-Medical): No  ?Physical Activity: Sufficiently Active  ? Days of Exercise per Week: 5 days  ? Minutes of Exercise per Session: 30 min  ?Stress: No Stress Concern Present  ? Feeling of Stress : Not at all  ?Social Connections: Socially Integrated  ? Frequency of Communication with Friends and Family: More than three times a week  ? Frequency of Social Gatherings with Friends and Family: More than three times a week  ? Attends Religious Services: More than 4 times per year  ? Active Member of Clubs or Organizations: Yes  ? Attends Archivist Meetings: More than 4 times per year  ? Marital Status: Married  ? ? ?Tobacco Counseling ?Counseling given: Not Answered ? ? ?Clinical Intake: ? ?Pre-visit preparation completed: Yes ? ?Pain : No/denies pain ? ?  ? ?BMI - recorded: 39.38 ?Nutritional Status: BMI > 30  Obese ?Nutritional Risks: None ?Diabetes: No ? ?How often do you need to have someone help you when you read instructions, pamphlets, or other written materials from your doctor or pharmacy?: 1 - Never ? ?Diabetic?NO ? ?Interpreter Needed?: No ? ?Information entered by :: mj Shuntavia Yerby, lpn ? ? ?Activities of Daily Living ? ?  05/01/2021  ?  2:29 PM 02/28/2021  ? 10:19 AM  ?In your present state of health, do you  have any difficulty performing the following activities:  ?Hearing? 0 1  ?Comment  followed by ENT  ?Vision? 0 0  ?Difficulty concentrating or making decisions? 0 0  ?Walking or climbing stairs? 0 0  ?

## 2021-05-05 ENCOUNTER — Encounter: Payer: Self-pay | Admitting: Primary Care

## 2021-05-05 ENCOUNTER — Other Ambulatory Visit: Payer: Self-pay | Admitting: Primary Care

## 2021-05-05 ENCOUNTER — Ambulatory Visit (INDEPENDENT_AMBULATORY_CARE_PROVIDER_SITE_OTHER)
Admission: RE | Admit: 2021-05-05 | Discharge: 2021-05-05 | Disposition: A | Discharge status: Home / Self Care | Payer: Medicare Other | Source: Ambulatory Visit | Attending: Primary Care | Admitting: Primary Care

## 2021-05-05 ENCOUNTER — Ambulatory Visit (INDEPENDENT_AMBULATORY_CARE_PROVIDER_SITE_OTHER): Payer: Medicare Other | Admitting: Primary Care

## 2021-05-05 VITALS — BP 140/70 | HR 98 | Temp 98.2°F | Ht <= 58 in | Wt 182.4 lb

## 2021-05-05 DIAGNOSIS — R051 Acute cough: Secondary | ICD-10-CM | POA: Diagnosis not present

## 2021-05-05 DIAGNOSIS — J849 Interstitial pulmonary disease, unspecified: Secondary | ICD-10-CM

## 2021-05-05 DIAGNOSIS — J449 Chronic obstructive pulmonary disease, unspecified: Secondary | ICD-10-CM | POA: Diagnosis not present

## 2021-05-05 LAB — POC COVID19 BINAXNOW: SARS Coronavirus 2 Ag: NEGATIVE

## 2021-05-05 MED ORDER — GUAIFENESIN-CODEINE 100-10 MG/5ML PO SYRP: 5.0000 mL | ORAL_SOLUTION | Freq: Three times a day (TID) | ORAL | 0 refills | Status: DC | PRN

## 2021-05-05 MED ORDER — AZITHROMYCIN 250 MG PO TABS
ORAL_TABLET | ORAL | 0 refills | Status: DC
Start: 2021-05-05 — End: 2021-07-27

## 2021-05-05 MED ORDER — FLUTICASONE FUROATE-VILANTEROL 100-25 MCG/ACT IN AEPB
1.0000 | INHALATION_SPRAY | Freq: Every day | RESPIRATORY_TRACT | 0 refills | Status: DC
Start: 2021-05-05 — End: 2021-07-27

## 2021-05-05 NOTE — Progress Notes (Signed)
? ?Subjective:  ? ? Patient ID: Teresa Jensen, female    DOB: [birth year], 76 y.o.   MRN: [id number] ? ?HPI ? ?Valetta Mulroy Wedel is a very pleasant 76 y.o. female with a history of hypertension, CHF, COPD, hypothyroidism who presents today to discuss cough. ? ?Symptom onset 2 days ago with chest congestion and sore throat. She then developed ear pain and cough, chills. Her cough is productive, thick mucous, feels like the mucous is stuck in her mid chest.  ? ?She denies chest tightness, increased shortness of breath, fevers. She's been taking Robitussin, Tylenol, and Mucinex, and albuterol inhaler without much improvement.  ? ?She's not using Advair as she could not afford the price.  Same for her Symbicort.  She has not tested for Covid-19 infection.  ? ? ?Review of Systems  ?Constitutional:  Positive for chills. Negative for fever.  ?HENT:  Positive for congestion and sore throat.   ?Respiratory:  Positive for cough. Negative for chest tightness and shortness of breath.   ?Neurological:  Negative for headaches.  ? ?   ? ? ?Past Medical History:  ?Diagnosis Date  ? (HFpEF) heart failure with preserved ejection fraction ([place])   ? Anxiety   ? Aortic atherosclerosis ([place])   ? CAP (community acquired pneumonia)   ? COPD (chronic obstructive pulmonary disease) ([place])   ? Dyspnea   ? DUE TO HEAT WITH COPD  ? Epistaxis 01/09/2017  ? GERD (gastroesophageal reflux disease)   ? OCC-NO MEDS  ? Grade I diastolic dysfunction   ? Hypertension   ? Hypothyroidism   ? Iliac artery stenosis, bilateral (HCC)   ? New onset of headaches   ? Osteopenia   ? PMR (polymyalgia rheumatica) (HCC)   ? Pneumonia 02/13/2018  ? PONV (postoperative nausea and vomiting)   ? VERY NAUSEATED AFTER SURGERY  ? Shingles   ? Stasis dermatitis   ? Strain of chest wall 01/20/2011  ? Tobacco abuse   ? ? ?Social History  ? ?Socioeconomic History  ? Marital status: Married  ?  Spouse name: [name]  ? Number of children: 1  ? Years of education: Not on file  ?  Highest education level: Not on file  ?Occupational History  ? Occupation: Therapist, art as a Forensic psychologist  ?  Employer: RETIRED  ?  Comment: retired  ?Tobacco Use  ? Smoking status: Former  ?  Packs/day: 1.50  ?  Years: 20.00  ?  Pack years: 30.00  ?  Types: Cigarettes  ?  Quit date: 02/11/2009  ?  Years since quitting: 12.2  ? Smokeless tobacco: Never  ?Vaping Use  ? Vaping Use: Never used  ?Substance and Sexual Activity  ? Alcohol use: No  ?  Alcohol/week: 0.0 standard drinks  ? Drug use: No  ? Sexual activity: Not Currently  ?Other Topics Concern  ? Not on file  ?Social History Narrative  ? Lives in [place] with her husband.  Takes care of her two grandchildren, ages 74 and 2, everyday in [place].  Has one son who is 28.  ? Desires CPR  ? Would not want prolonged life support if futile  ? ?Social Determinants of Health  ? ?Financial Resource Strain: Low Risk   ? Difficulty of Paying Living Expenses: Not hard at all  ?Food Insecurity: No Food Insecurity  ? Worried About Charity fundraiser in the Last Year: Never true  ? Ran Out of Food in the Last Year: Never true  ?  Transportation Needs: No Transportation Needs  ? Lack of Transportation (Medical): No  ? Lack of Transportation (Non-Medical): No  ?Physical Activity: Sufficiently Active  ? Days of Exercise per Week: 5 days  ? Minutes of Exercise per Session: 30 min  ?Stress: No Stress Concern Present  ? Feeling of Stress : Not at all  ?Social Connections: Socially Integrated  ? Frequency of Communication with Friends and Family: More than three times a week  ? Frequency of Social Gatherings with Friends and Family: More than three times a week  ? Attends Religious Services: More than 4 times per year  ? Active Member of Clubs or Organizations: Yes  ? Attends Archivist Meetings: More than 4 times per year  ? Marital Status: Married  ?Intimate Partner Violence: Not At Risk  ? Fear of Current or Ex-Partner: No  ? Emotionally Abused: No  ? Physically  Abused: No  ? Sexually Abused: No  ? ? ?Past Surgical History:  ?Procedure Laterality Date  ? ABDOMINAL HYSTERECTOMY    ? APPENDECTOMY    ? BACK SURGERY    ? LUMBAR FUSION  ? CARPAL TUNNEL RELEASE Left   ? CARPAL TUNNEL RELEASE Left 08/31/2019  ? Procedure: CARPAL TUNNEL RELEASE;  Surgeon: Dereck Leep, MD;  Location: [facility] ORS;  Service: Orthopedics;  Laterality: Left;  ? CARPAL TUNNEL RELEASE Right 06/22/2020  ? Procedure: CARPAL TUNNEL RELEASE;  Surgeon: Dereck Leep, MD;  Location: [facility] OR[facility];  Service: Orthopedics;  Laterality: Right;  ? CERVICAL FUSION    ? CHOLECYSTECTOMY    ? HEMORRHOID SURGERY    ? LUMBAR FUSION    ? TUBAL LIGATION    ? ? ?Family History  ?Problem Relation Age of Onset  ? Heart attack Mother 62  ? Cancer Father   ?     lung/chest wall   ? Prostate cancer Father   ? Diabetes Brother   ?     siblings  ? Hypertension Sister   ?     x 2  ? Heart disease Brother   ? Breast cancer Paternal Grandmother   ? Heart disease Sister   ? Diabetes Sister   ? Tuberculosis Sister   ? Cirrhosis Brother   ? ? ?Allergies  ?Allergen Reactions  ? Epinephrine Anaphylaxis and Shortness Of Breath  ? Food Rash  ?  MANGO = cause rashes around the mouth ?LIPS GO NUMB  ? Mangifera Indica Rash  ?  THIS IS MANGO ?LIPS GO NUMB  ? Other Rash  ?  MANGO = cause rashes around the mouth ?MANGO = cause rashes around the mouth ?LIPS GO NUMB  ? Prednisone Anaphylaxis, Hives, Shortness Of Breath, Itching and Swelling  ?  The face swells ?HAS TAKEN CORTISONE SHOTS WITHOUT ISSUE BEFORE  ? Milk-Related Compounds Diarrhea  ?  DAIRY PRODUCTS ARE A PROBLEM  ? Pneumococcal 13-Val Conj Vacc Itching and Other (See Comments)  ?  Arm temp elevated at site of injection, red, itching  ? Prevnar 13 [Pneumococcal 13-Val Conj Vacc] Other (See Comments)  ?  Arm temp elevated at site of injection, red, itching  ? ? ?Current Outpatient Medications on File Prior to Visit  ?Medication Sig Dispense Refill  ? albuterol (VENTOLIN HFA) 108 (90 Base)  MCG/ACT inhaler Inhale 2 puffs into the lungs every 4 (four) hours as needed for shortness of breath. 1 each 0  ? alendronate (FOSAMAX) 70 MG tablet Take 1 tablet (70 mg total) by mouth every 7 (seven)  days. Take with a full glass of water on an empty stomach. 12 tablet 2  ? ALPRAZolam (XANAX) 0.25 MG tablet Take 0.5 tablets (0.125 mg total) by mouth daily as needed for anxiety.    ? amLODipine (NORVASC) 5 MG tablet Take 1 tablet (5 mg total) by mouth daily. For blood pressure. 90 tablet 0  ? Calcium Carb-Cholecalciferol (CALCIUM + VITAMIN D3 PO) Take 1,200 mg by mouth daily.    ? fluticasone-salmeterol (ADVAIR DISKUS) 250-50 MCG/ACT AEPB Inhale 1 puff into the lungs in the morning and at bedtime. 1 each 0  ? hydroxychloroquine (PLAQUENIL) 200 MG tablet Take 1 tablet (200 mg total) by mouth 2 (two) times daily. 60 tablet 5  ? levothyroxine (SYNTHROID) 75 MCG tablet TAKE 1 TABLET BY MOUTH EVERY MORNING ON AN EMPTY STOMACH WITH WATER ONLY. NO FOOD OR OTHER MEDICATIONS FOR 30 MINUTES. 90 tablet 3  ? losartan (COZAAR) 50 MG tablet Take 2 tablets (100 mg total) by mouth daily. 180 tablet 3  ? Multiple Vitamins-Minerals (PRESERVISION AREDS 2 PO) Take 1 tablet by mouth in the morning and at bedtime.    ? tacrolimus (PROTOPIC) 0.1 % ointment Apply to aa's lower legs BID PRN. 60 g 3  ? ?No current facility-administered medications on file prior to visit.  ? ? ?BP 140/70   Pulse 98   Temp 98.2 ?F (36.8 ?C)   Ht 4\' 9"  (1.448 m)   Wt 182 lb 6.4 oz (82.7 kg)   SpO2 93%   BMI 39.47 kg/m?  ?Objective:  ? Physical Exam ?Constitutional:   ?   General: She is not in acute distress. ?   Appearance: She is ill-appearing.  ?HENT:  ?   Right Ear: Tympanic membrane and ear canal normal.  ?   Left Ear: Tympanic membrane and ear canal normal.  ?   Nose:  ?   Right Sinus: No maxillary sinus tenderness or frontal sinus tenderness.  ?   Left Sinus: No maxillary sinus tenderness or frontal sinus tenderness.  ?Eyes:  ?    Conjunctiva/sclera: Conjunctivae normal.  ?Cardiovascular:  ?   Rate and Rhythm: Normal rate and regular rhythm.  ?Pulmonary:  ?   Effort: Pulmonary effort is normal.  ?   Breath sounds: Normal breath sounds. No wheezing or rales.

## 2021-05-05 NOTE — Assessment & Plan Note (Signed)
Advair and Symbicort both cost prohibitive. ? ?Prescription for Breo 100-25 mcg once daily sent to pharmacy.  ? ?She will update if [facility] is cost prohibitive.  At that point we will ask our pharmacist to look into patient assistance. ?

## 2021-05-05 NOTE — Addendum Note (Signed)
Addended by: Tyrone Apple on: 05/05/2021 11:15 AM ? ? Modules accepted: Orders ? ?

## 2021-05-05 NOTE — Patient Instructions (Addendum)
Complete xray(s) prior to leaving today. I will notify you of your results once received. ? ?You may take the cough suppressant every 8 hours as needed for cough and rest. Caution this medication contains codeine which may cause drowsiness.  ? ?Continue Mucinex.  ? ?Use the albuterol inhaler for shortness of breath.  ? ?Start Breo inhaler once daily for your COPD.  Please notify me if this is too expensive. ? ?It was a pleasure to see you today! ? ?

## 2021-05-05 NOTE — Assessment & Plan Note (Signed)
Appears viral, do need to exclude pneumonia and given her history.  She does not seem to be experiencing a COPD exacerbation at this point. ? ?COVID-19 test today negative. ?Checking chest x-ray today. ? ?Discussed conservative treatment, continue Mucinex as needed, albuterol inhaler as needed. ? ?Prescription for Cheratussin suspension sent to pharmacy.  She has taken this previously and has done well.  Drowsiness precautions provided. ?

## 2021-06-04 ENCOUNTER — Other Ambulatory Visit: Payer: Self-pay | Admitting: Primary Care

## 2021-06-04 DIAGNOSIS — I10 Essential (primary) hypertension: Secondary | ICD-10-CM

## 2021-06-05 ENCOUNTER — Other Ambulatory Visit: Payer: Self-pay | Admitting: Primary Care

## 2021-06-05 ENCOUNTER — Encounter: Payer: Self-pay | Admitting: Cardiovascular Disease

## 2021-06-05 ENCOUNTER — Telehealth: Payer: Self-pay | Admitting: Internal Medicine

## 2021-06-05 DIAGNOSIS — Z1231 Encounter for screening mammogram for malignant neoplasm of breast: Secondary | ICD-10-CM

## 2021-06-05 MED ORDER — LOSARTAN POTASSIUM 100 MG PO TABS: 100.0000 mg | ORAL_TABLET | Freq: Every day | ORAL | 0 refills | Status: DC

## 2021-06-05 NOTE — Telephone Encounter (Signed)
*  STAT* If patient is at the pharmacy, call can be transferred to refill team.   1. Which medications need to be refilled? (please list name of each medication and dose if known) losartan 100 mg po q d   2. Which pharmacy/location (including street and city if local pharmacy) is medication to be sent to? [facility] [address] [place]   3. Do they need a 30 day or 90 day supply? [place]

## 2021-06-05 NOTE — Telephone Encounter (Signed)
Requested Prescriptions   Signed Prescriptions Disp Refills   losartan (COZAAR) 100 MG tablet 90 tablet 0    Sig: Take 1 tablet (100 mg total) by mouth daily.    Authorizing Provider: END, CHRISTOPHER    Ordering User: Raelene Bott, Hannie Shoe L

## 2021-06-05 NOTE — Telephone Encounter (Signed)
ERROR

## 2021-06-12 ENCOUNTER — Ambulatory Visit (INDEPENDENT_AMBULATORY_CARE_PROVIDER_SITE_OTHER): Payer: Medicare Other | Admitting: Dermatology

## 2021-06-12 ENCOUNTER — Encounter: Payer: Self-pay | Admitting: Dermatology

## 2021-06-12 DIAGNOSIS — I8312 Varicose veins of left lower extremity with inflammation: Secondary | ICD-10-CM

## 2021-06-12 DIAGNOSIS — I8311 Varicose veins of right lower extremity with inflammation: Secondary | ICD-10-CM | POA: Diagnosis not present

## 2021-06-12 NOTE — Patient Instructions (Signed)
Due to recent changes in healthcare laws, you may see results of your pathology and/or laboratory studies on MyChart before the doctors have had a chance to review them. We understand that in some cases there may be results that are confusing or concerning to you. Please understand that not all results are received at the same time and often the doctors may need to interpret multiple results in order to provide you with the best plan of care or course of treatment. Therefore, we ask that you please give us 2 business days to thoroughly review all your results before contacting the office for clarification. Should we see a critical lab result, you will be contacted sooner.   If You Need Anything After Your Visit  If you have any questions or concerns for your doctor, please call our main line at [phone] and press option 4 to reach your doctor's medical assistant. If no one answers, please leave a voicemail as directed and we will return your call as soon as possible. Messages left after 4 pm will be answered the following business day.   You may also send us a message via MyChart. We typically respond to MyChart messages within 1-2 business days.  For prescription refills, please ask your pharmacy to contact our office. Our fax number is [phone].  If you have an urgent issue when the clinic is closed that cannot wait until the next business day, you can [place] your doctor at the number below.    Please note that while we do our best to be available for urgent issues outside of office hours, we are not available 24/7.   If you have an urgent issue and are unable to reach us, you may choose to seek medical care at your doctor's office, retail clinic, urgent care center, or emergency room.  If you have a medical emergency, please immediately call 911 or go to the emergency department.  Pager Numbers  - Dr. Kowalski: [phone]  - Dr. Moye: [phone]  - Dr. Stewart:  [phone]  In the event of inclement weather, please call our main line at [phone] for an update on the status of any delays or closures.  Dermatology Medication Tips: Please keep the boxes that topical medications come in in order to help keep track of the instructions about where and how to use these. Pharmacies typically print the medication instructions only on the boxes and not directly on the medication tubes.   If your medication is too expensive, please contact our office at [phone] option 4 or send us a message through MyChart.   We are unable to tell what your co-pay for medications will be in advance as this is different depending on your insurance coverage. However, we may be able to find a substitute medication at lower cost or fill out paperwork to get insurance to cover a needed medication.   If a prior authorization is required to get your medication covered by your insurance company, please allow us 1-2 business days to complete this process.  Drug prices often vary depending on where the prescription is filled and some pharmacies may offer cheaper prices.  The website www.goodrx.com contains coupons for medications through different pharmacies. The prices here do not account for what the cost may be with help from insurance (it may be cheaper with your insurance), but the website can give you the price if you did not use any insurance.  - You can print the associated coupon and take it with   your prescription to the pharmacy.  - You may also stop by our office during regular business hours and pick up a GoodRx coupon card.  - If you need your prescription sent electronically to a different pharmacy, notify our office through [place] MyChart or by phone at [phone] option 4.     Si Usted Necesita Algo Despus de Su Visita  Tambin puede enviarnos un mensaje a travs de MyChart. Por lo general respondemos a los mensajes de MyChart en el transcurso de 1 a 2  das hbiles.  Para renovar recetas, por favor pida a su farmacia que se ponga en contacto con nuestra oficina. Nuestro nmero de fax es el [phone].  Si tiene un asunto urgente cuando la clnica est cerrada y que no puede esperar hasta el siguiente da hbil, puede llamar/localizar a su doctor(a) al nmero que aparece a continuacin.   Por favor, tenga en cuenta que aunque hacemos todo lo posible para estar disponibles para asuntos urgentes fuera del horario de oficina, no estamos disponibles las 24 horas del da, los 7 das de la semana.   Si tiene un problema urgente y no puede comunicarse con nosotros, puede optar por buscar atencin mdica  en el consultorio de su doctor(a), en una clnica privada, en un centro de atencin urgente o en una sala de emergencias.  Si tiene una emergencia mdica, por favor llame inmediatamente al 911 o vaya a la sala de emergencias.  Nmeros de bper  - Dr. Kowalski: [phone]  - Dra. Moye: [phone]  - Dra. Stewart: [phone]  En caso de inclemencias del tiempo, por favor llame a nuestra lnea principal al [phone] para una actualizacin sobre el estado de cualquier retraso o cierre.  Consejos para la medicacin en dermatologa: Por favor, guarde las cajas en las que vienen los medicamentos de uso tpico para ayudarle a seguir las instrucciones sobre dnde y cmo usarlos. Las farmacias generalmente imprimen las instrucciones del medicamento slo en las cajas y no directamente en los tubos del medicamento.   Si su medicamento es muy caro, por favor, pngase en contacto con nuestra oficina llamando al [phone] y presione la opcin 4 o envenos un mensaje a travs de MyChart.   No podemos decirle cul ser su copago por los medicamentos por adelantado ya que esto es diferente dependiendo de la cobertura de su seguro. Sin embargo, es posible que podamos encontrar un medicamento sustituto a menor costo o llenar un formulario para que el  seguro cubra el medicamento que se considera necesario.   Si se requiere una autorizacin previa para que su compaa de seguros cubra su medicamento, por favor permtanos de 1 a 2 das hbiles para completar este proceso.  Los precios de los medicamentos varan con frecuencia dependiendo del lugar de dnde se surte la receta y alguna farmacias pueden ofrecer precios ms baratos.  El sitio web www.goodrx.com tiene cupones para medicamentos de diferentes farmacias. Los precios aqu no tienen en cuenta lo que podra costar con la ayuda del seguro (puede ser ms barato con su seguro), pero el sitio web puede darle el precio si no utiliz ningn seguro.  - Puede imprimir el cupn correspondiente y llevarlo con su receta a la farmacia.  - Tambin puede pasar por nuestra oficina durante el horario de atencin regular y recoger una tarjeta de cupones de GoodRx.  - Si necesita que su receta se enve electrnicamente a una farmacia diferente, informe a nuestra oficina a travs de MyChart de [place]   o por telfono llamando al [phone] y presione la opcin 4.  

## 2021-06-12 NOTE — Progress Notes (Signed)
   Follow-Up Visit   Subjective  Teresa Jensen is a 76 y.o. female who presents for the following: Dermatitis (6 months f/u Lipodermatoscleriosis of the lower legs treating with Tacrolimus ointment and taking Plaquenil 200 mg twice a day with a fair response. ).  She had an eye exam in Jan and was diagnosed with macular degeneration in March.   The following portions of the chart were reviewed this encounter and updated as appropriate:       Review of Systems:  No other skin or systemic complaints except as noted in HPI or Assessment and Plan.  Objective  Well appearing patient in no apparent distress; mood and affect are within normal limits.  A focused examination was performed including bilateral pretibial. Relevant physical exam findings are noted in the Assessment and Plan.  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 Currently well-controlled.  Lipodermatosclerosis is a chronic inflammatory condition of unknown cause of the subcutaneous fat causing tenderness, discoloration and hardening of the involved skin, most commonly on the lower legs. Discussed that it may progress and gradually worsen over time, especially in the setting of chronic leg swelling. Daily compression stockings/hose is recommended.   Labs reviewed CMP, CBC 02/28/21 WNL  We will request a release of records from Dr. Malachy Chamber. [place] ophthalmologist due to new diagnosis of macular degeneration.     Continue hydroxychloroquine 200 mg twice a day. Tolerating well.   Cont Tacrolimus ointment twice a day Keep scheduled yearly eye exams  Continue use of compression stockings daily Continue Gold  Bond Rapid Relief anti-itch cream TID PRN.   Stasis in the legs causes chronic leg swelling, which may result in itchy or painful rashes, skin discoloration, skin texture changes, and sometimes ulceration.  Recommend daily graduated compression hose/stockings- easiest to put on first thing in morning, remove at bedtime.  Elevate legs as much as possible. Avoid salt/sodium rich foods.     Related Medications hydroxychloroquine (PLAQUENIL) 200 MG tablet Take 1 tablet (200 mg total) by mouth 2 (two) times daily.   Return in about 6 months (around 12/12/2021) for lipodermatosclerosis .  I, Marye Round, CMA, am acting as scribe for Brendolyn Patty, MD .   Documentation: I have reviewed the above documentation for accuracy and completeness, and I agree with the above.  Brendolyn Patty MD

## 2021-06-14 ENCOUNTER — Other Ambulatory Visit: Payer: Self-pay | Admitting: Internal Medicine

## 2021-06-14 DIAGNOSIS — I10 Essential (primary) hypertension: Secondary | ICD-10-CM

## 2021-07-13 ENCOUNTER — Ambulatory Visit
Admission: RE | Admit: 2021-07-13 | Discharge: 2021-07-13 | Disposition: A | Discharge status: Home / Self Care | Payer: Medicare Other | Source: Ambulatory Visit | Attending: Primary Care | Admitting: Primary Care

## 2021-07-13 DIAGNOSIS — Z1231 Encounter for screening mammogram for malignant neoplasm of breast: Secondary | ICD-10-CM | POA: Insufficient documentation

## 2021-07-27 ENCOUNTER — Other Ambulatory Visit
Admission: RE | Admit: 2021-07-27 | Discharge: 2021-07-27 | Disposition: A | Discharge status: Home / Self Care | Payer: Medicare Other | Attending: Internal Medicine | Admitting: Internal Medicine

## 2021-07-27 ENCOUNTER — Encounter: Payer: Self-pay | Admitting: Internal Medicine

## 2021-07-27 ENCOUNTER — Ambulatory Visit (INDEPENDENT_AMBULATORY_CARE_PROVIDER_SITE_OTHER): Payer: Medicare Other | Admitting: Internal Medicine

## 2021-07-27 DIAGNOSIS — J8410 Pulmonary fibrosis, unspecified: Secondary | ICD-10-CM | POA: Diagnosis present

## 2021-07-27 DIAGNOSIS — J449 Chronic obstructive pulmonary disease, unspecified: Secondary | ICD-10-CM | POA: Diagnosis present

## 2021-07-27 LAB — CBC WITH DIFFERENTIAL/PLATELET
Abs Immature Granulocytes: 0.04 10*3/uL (ref 0.00–0.07)
Basophils Absolute: 0.1 10*3/uL (ref 0.0–0.1)
Basophils Relative: 1 %
Eosinophils Absolute: 0.1 10*3/uL (ref 0.0–0.5)
Eosinophils Relative: 2 %
HCT: 40.3 % (ref 36.0–46.0)
Hemoglobin: 12.3 g/dL (ref 12.0–15.0)
Immature Granulocytes: 0 %
Lymphocytes Relative: 23 %
Lymphs Abs: 2.2 10*3/uL (ref 0.7–4.0)
MCH: 28 pg (ref 26.0–34.0)
MCHC: 30.5 g/dL (ref 30.0–36.0)
MCV: 91.6 fL (ref 80.0–100.0)
Monocytes Absolute: 1 10*3/uL (ref 0.1–1.0)
Monocytes Relative: 10 %
Neutro Abs: 6 10*3/uL (ref 1.7–7.7)
Neutrophils Relative %: 64 %
Platelets: 315 10*3/uL (ref 150–400)
RBC: 4.4 MIL/uL (ref 3.87–5.11)
RDW: 14.8 % (ref 11.5–15.5)
WBC: 9.5 10*3/uL (ref 4.0–10.5)
nRBC: 0 % (ref 0.0–0.2)

## 2021-07-27 LAB — SEDIMENTATION RATE: Sed Rate: 65 mm/hr — ABNORMAL HIGH (ref 0–30)

## 2021-07-27 NOTE — Progress Notes (Signed)
Teresa Jensen, female    DOB: Aug 02, 1945   MRN: [id number]   Brief patient profile:  89  yowf quit smoking  2011  referred to pulmonary clinic in [facility]  07/27/2021 by NP clark   for copd/abn cxr         PFT's 06/2010: FEV1 1.20 (56%), ratio 68, significant airtrapping, DLCO 46% but nearly corrects with Alv vol   History of Present Illness  07/27/2021  Pulmonary/ 1st office eval/ Pasha Gadison / [employer] / on plaquenil for "lipodermatosclerosis" per derm Dyspnea:  MMRC2 = can't walk a nl pace on a flat grade s sob but does fine slow and flat  Cough: better but several am's  week wakes up with clear mucus < 1 tsp  Sleep: bed flat lots of pillows under head 30 degrees due to gerd  SABA use: none  Bad noct gerd since taking fosamax x ? One year/ rx per pcp ? Pepcid did not help  No obvious day to day or daytime pattern/variability or assoc purulent sputum or mucus plugs or hemoptysis or cp or chest tightness, subjective wheeze or overt sinus   symptoms.    Also denies any obvious fluctuation of symptoms with weather or environmental changes or other aggravating or alleviating factors except as outlined above   No unusual exposure hx or h/o childhood pna/ asthma or knowledge of premature birth.  Current Allergies, Complete Past Medical History, Past Surgical History, Family History, and Social History were reviewed in [employer] record.  ROS  The following are not active complaints unless bolded Hoarseness, sore throat, dysphagia, dental problems, itching, sneezing,  nasal congestion or discharge of excess mucus or purulent secretions, ear ache,   fever, chills, sweats, unintended wt loss or wt gain, classically pleuritic or exertional cp,  orthopnea pnd or arm/hand swelling  or leg swelling, presyncope, palpitations, abdominal pain, anorexia, nausea, vomiting, diarrhea  or change in bowel habits or change in bladder habits, change in stools or change in urine,  dysuria, hematuria,  rash, arthralgias, visual complaints, headache, numbness, weakness or ataxia or problems with walking or coordination,  change in mood or  memory.           Past Medical History:  Diagnosis Date   (HFpEF) heart failure with preserved ejection fraction (HCC)    Anxiety    Aortic atherosclerosis (HCC)    CAP (community acquired pneumonia)    COPD (chronic obstructive pulmonary disease) ([place])    Dyspnea    DUE TO HEAT WITH COPD   Epistaxis 01/09/2017   GERD (gastroesophageal reflux disease)    OCC-NO MEDS   Grade I diastolic dysfunction    Hypertension    Hypothyroidism    Iliac artery stenosis, bilateral ([place])    New onset of headaches    Osteopenia    PMR (polymyalgia rheumatica) (HCC)    Pneumonia 02/13/2018   PONV (postoperative nausea and vomiting)    VERY NAUSEATED AFTER SURGERY   Shingles    Stasis dermatitis    Strain of chest wall 01/20/2011   Tobacco abuse     Outpatient Medications Prior to Visit  Medication Sig Dispense Refill      0   alendronate (FOSAMAX) 70 MG tablet Take 1 tablet (70 mg total) by mouth every 7 (seven) days. Take with a full glass of water on an empty stomach. 12 tablet 2   amLODipine (NORVASC) 5 MG tablet TAKE 1 TABLET BY MOUTH EVERY DAY FOR BLOOD  PRESSURE 90 tablet 2   Calcium Carb-Cholecalciferol (CALCIUM + VITAMIN D3 PO) Take 1,200 mg by mouth daily.     hydroxychloroquine (PLAQUENIL) 200 MG tablet Take 1 tablet (200 mg total) by mouth 2 (two) times daily. 60 tablet 5   levothyroxine (SYNTHROID) 75 MCG tablet TAKE 1 TABLET BY MOUTH EVERY MORNING ON AN EMPTY STOMACH WITH WATER ONLY. NO FOOD OR OTHER MEDICATIONS FOR 30 MINUTES. 90 tablet 3   losartan (COZAAR) 100 MG tablet Take 1 tablet (100 mg total) by mouth daily. 90 tablet 0   Multiple Vitamins-Minerals (PRESERVISION AREDS 2 PO) Take 1 tablet by mouth in the morning and at bedtime.     tacrolimus (PROTOPIC) 0.1 % ointment Apply to aa's lower legs BID PRN. 60 g 3    ALPRAZolam (XANAX) 0.25 MG tablet Take 0.5 tablets (0.125 mg total) by mouth daily as needed for anxiety. (Patient not taking: Reported on 07/27/2021)         0       0       0   No facility-administered medications prior to visit.     Objective:     BP 120/60 (BP Location: Left Arm, Patient Position: Sitting, Cuff Size: Large)   Pulse 98   Temp 98 F (36.7 C) (Oral)   Ht 4\' 9"  (1.448 m)   Wt 184 lb 12.8 oz (83.8 kg)   SpO2 97%   BMI 39.99 kg/m   SpO2: 97 %  Amb pleasant obese (by BMI) wf nad    HEENT : Oropharynx  cler   Nasal turbinates nl    NECK :  without  apparent JVD/ palpable Nodes/TM    LUNGS: no acc muscle use,  Min barrel  contour chest wall with bilateral  slightly decreased bs insp crackles in bases and  without cough on insp or exp maneuvers and min  Hyperresonant  to  percussion bilaterally    CV:  RRR  no s3 or murmur or increase in P2, and no edema   ABD:  Obese soft and nontender with pos end  insp Hoover's  in the supine position.  No bruits or organomegaly appreciated   MS:  Nl gait/ ext warm without deformities Or obvious joint restrictions  calf tenderness, cyanosis or clubbing     SKIN: warm and dry without lesions    NEURO:  alert, approp, nl sensorium with  no motor or cerebellar deficits apparent.           I personally reviewed images and agree with radiology impression as follows:  CXR:  05/05/21 pa and lateral Stable diffuse interstitial densities are noted bilaterally most consistent with chronic scarring or interstitial lung disease, although superimposed acute atypical inflammation or edema cannot be excluded. My review : mod kyphosis/ mild copd/ min ild     Assessment   COPD GOLD 2  Quit smoking in 2012  - PFT's 06/2010:  FEV1 1.20 (56%), ratio 68, significant airtrapping, DLCO 46% but nearly corrects with Alv volume  - Labs ordered 07/27/2021  :  allergy screen   alpha one AT phenotype - 07/27/2021   Walked on RA  x  2  lap(s)  =  approx 350  ft  @ slow to mod pace, stopped due to "tired" at end  with lowest 02 sats 90%    When respiratory symptoms begin or become refractory well after a patient reports complete smoking cessation,  Especially when this wasn't the case while they were smoking,  a red flag is raised based on the work of Dr Kris Mouton which states:  if you quit smoking when your best day FEV1 is still well preserved it is highly unlikely you will progress to severe disease.  That is to say, once the smoking stops,  the symptoms should not suddenly erupt or markedly worsen.  If so, the differential diagnosis should include  obesity/deconditioning,  LPR/Reflux/Aspiration syndromes,  occult CHF, or  especially side effect of medications commonly used in this population.    She is now bothered by over gerd refractory to ? pepcid while on biphosphenates so reasonable to first see if the symptoms resolve on gerd rx and off fosamax and if not add ppi ac once or twice daily and consider prolia or reclast options.    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   In active  smokers Langerhan's Cell  Histiocyctosis (eosinophilic granuomatosis),  DIP,  and Respiratory Bronchiolitis ILD also need to be considered, but dont' apply here     Most likely one of the more benign forms of PF so will start with w/u as above and  conservative rx for gerd (see above) and get pfts/ results of the above before considering for HRCT  with serial walking sats for now best way to see if there is progression or not  Discussed in detail all the  indications, usual  risks and alternatives  relative to the benefits with patient who agrees to proceed with conservative f/u as outlined    Each maintenance medication was reviewed in detail including emphasizing most importantly the difference between maintenance and prns and under what circumstances the prns are to be triggered using an action plan format where appropriate.  Total time for H and P, chart review, counseling,  directly observing portions of ambulatory 02 saturation study/ and generating customized AVS unique to this office visit / same day charting = 48 min with pt new to me                   Christinia Gully, MD 07/27/2021

## 2021-07-27 NOTE — Addendum Note (Signed)
Addended by: Vanessa Barbara on: 07/27/2021 04:12 PM   Modules accepted: Orders

## 2021-07-27 NOTE — Assessment & Plan Note (Addendum)
Onset ? 2020 p pneumonia in pt on plaquenil for  "lipodermatosclerosis"   -  07/27/2021 slow to mod pace walk x 350 ft with lowest sats 90% - 07/27/2021 collagen vasc dz w/u: -07/27/2021  HSP serology >>>   DDx for pulmonary fibrosis  includes idiopathic pulmonary fibrosis, pulmonary fibrosis associated with rheumatologic diseases (which have a relatively benign course in most cases) , adverse effect from  drugs such as chemotherapy or amiodarone/ macrodantin exposure.   In active  smokers Langerhan's Cell  Histiocyctosis (eosinophilic granuomatosis),  DIP,  and Respiratory Bronchiolitis ILD also need to be considered, but dont' apply here     Most likely one of the more benign forms of PF so will start with w/u as above and  conservative rx for gerd (see above) and get pfts/ results of the above before considering for HRCT with serial walking sats for now best way to see if there is progression or not  Discussed in detail all the  indications, usual  risks and alternatives  relative to the benefits with patient who agrees to proceed with conservative f/u as outlined    Each maintenance medication was reviewed in detail including emphasizing most importantly the difference between maintenance and prns and under what circumstances the prns are to be triggered using an action plan format where appropriate.  Total time for H and P, chart review, counseling,  directly observing portions of ambulatory 02 saturation study/ and generating customized AVS unique to this office visit / same day charting = 48 min with pt new to me

## 2021-07-27 NOTE — Assessment & Plan Note (Signed)
Quit smoking in 2012  - PFT's 06/2010:  FEV1 1.20 (56%), ratio 68, significant airtrapping, DLCO 46% but nearly corrects with Alv volume  - Labs ordered 07/27/2021  :  allergy screen   alpha one AT phenotype - 07/27/2021   Walked on RA  x  2  lap(s) =  approx 350  ft  @ slow to mod pace, stopped due to "tired" at end  with lowest 02 sats 90%    When respiratory symptoms begin or become refractory well after a patient reports complete smoking cessation,  Especially when this wasn't the case while they were smoking, a red flag is raised based on the work of Dr Kris Mouton which states:  if you quit smoking when your best day FEV1 is still well preserved it is highly unlikely you will progress to severe disease.  That is to say, once the smoking stops,  the symptoms should not suddenly erupt or markedly worsen.  If so, the differential diagnosis should include  obesity/deconditioning,  LPR/Reflux/Aspiration syndromes,  occult CHF, or  especially side effect of medications commonly used in this population.    She is now bothered by over gerd refractory to ? pepcid while on biphosphenates so reasonable to first see if the symptoms resolve on gerd rx and off fosamax and if not add ppi ac once or twice daily and consider prolia or reclast options.    >>> f/u in 6 weeks with pfts on return

## 2021-07-27 NOTE — Patient Instructions (Signed)
Try off fosamax for the next 4 weeks   GERD (REFLUX)  is an extremely common cause of respiratory symptoms just like yours , many times with no obvious heartburn at all.    It can be treated with medication, but also with lifestyle changes including elevation of the head of your bed (ideally with 6 -8inch blocks under the headboard of your bed),  Smoking cessation, avoidance of late meals, excessive alcohol, and avoid fatty foods, chocolate, peppermint, colas, red wine, and acidic juices such as orange juice.  NO MINT OR MENTHOL PRODUCTS SO NO COUGH DROPS  USE SUGARLESS CANDY INSTEAD (Jolley ranchers or Stover's or Life Savers) or even ice chips will also do - the key is to swallow to prevent all throat clearing. NO OIL BASED VITAMINS - use powdered substitutes.  Avoid fish oil when coughing.   Please remember to go to the lab department   for your tests - we will call you with the results when they are available.      Please schedule a follow up office visit in 6 weeks, call sooner if neededw with PFTs on return

## 2021-07-28 LAB — CYCLIC CITRUL PEPTIDE ANTIBODY, IGG/IGA: CCP Antibodies IgG/IgA: 6 units (ref 0–19)

## 2021-07-28 LAB — RHEUMATOID FACTOR: Rheumatoid fact SerPl-aCnc: 11.2 IU/mL (ref <14.0)

## 2021-07-28 LAB — ANA: Anti Nuclear Antibody (ANA): NEGATIVE

## 2021-07-31 LAB — ALPHA-1-ANTITRYPSIN PHENOTYP: A-1 Antitrypsin, Ser: 168 mg/dL (ref 101–187)

## 2021-07-31 LAB — IGE: IgE (Immunoglobulin E), Serum: 3 IU/mL — ABNORMAL LOW (ref 6–495)

## 2021-08-02 ENCOUNTER — Encounter: Payer: Self-pay | Admitting: Internal Medicine

## 2021-08-02 NOTE — Telephone Encounter (Signed)
Patient is requesting lab results and recommendations.   Dr. Melvyn Novas, please advise. Thanks

## 2021-08-04 ENCOUNTER — Ambulatory Visit (INDEPENDENT_AMBULATORY_CARE_PROVIDER_SITE_OTHER): Payer: Medicare Other | Admitting: Primary Care

## 2021-08-04 ENCOUNTER — Encounter: Payer: Self-pay | Admitting: Primary Care

## 2021-08-04 DIAGNOSIS — M79604 Pain in right leg: Secondary | ICD-10-CM

## 2021-08-04 DIAGNOSIS — M79601 Pain in right arm: Secondary | ICD-10-CM | POA: Insufficient documentation

## 2021-08-04 DIAGNOSIS — G8929 Other chronic pain: Secondary | ICD-10-CM | POA: Insufficient documentation

## 2021-08-04 DIAGNOSIS — M79606 Pain in leg, unspecified: Secondary | ICD-10-CM | POA: Insufficient documentation

## 2021-08-04 MED ORDER — TIZANIDINE HCL 4 MG PO TABS: 4.0000 mg | ORAL_TABLET | Freq: Every evening | ORAL | 0 refills | Status: DC | PRN

## 2021-08-04 NOTE — Assessment & Plan Note (Signed)
Unclear etiology, does seem to be MSK related, although there was no strain or injury. Doesn't appear to be herpes zoster, low suspicion for CVA. She has normal ROM to lumbar spine, no pain to lumbar spine.  Trial of tizanidine 4 mg HS provided. Discussed use of Tylenol PRN.   Sports medicine follow up if no improvement.

## 2021-08-04 NOTE — Patient Instructions (Signed)
You may take Tizanidine 4 mg at bedtime for muscle spasms.  You may take Tylenol during the day for pain.  Schedule an appointment with Dr. Lorelei Pont if no improvement.  It was a pleasure to see you today!

## 2021-08-04 NOTE — Assessment & Plan Note (Signed)
Unclear etiology, does seem to be MSK related, although there was no strain or injury. Doesn't appear to be herpes zoster, low suspicion for CVA. She has normal ROM to shoulder and no pain to shoulder or neck.  Trial of tizanidine 4 mg HS provided. Discussed use of Tylenol PRN.   Sports medicine follow up if no improvement.

## 2021-08-04 NOTE — Progress Notes (Signed)
Subjective:    Patient ID: Teresa Jensen, female    DOB: 1945/07/24, 76 y.o.   MRN: [id number]  Arm Pain  Pertinent negatives include no numbness.    Teresa Jensen is a very pleasant 76 y.o. female with a history of hypertension, COPD, hypothyroidism, CHF, prediabetes, AKI, carpal tunnel syndrome who presents today to discuss extremity pain.   Her pain is located to the right anterior humeral region of the upper extremity and her right later thigh. She experiences her pain with movement and rest. She describes her pain as "an achy muscle". Her symptoms began 2 weeks ago. Overall she's about the same.   She denies injury, trauma, lifting heavy objects, increased activity, rash, weakness, numbness, radiation of pain, shoulder pain, neck pain. History of bilateral carpal tunnel release surgery, wears braces at night.   She's taken Tylenol a few times with some improvement.    Review of Systems  Musculoskeletal:  Positive for myalgias.  Skin:  Negative for color change and rash.  Neurological:  Negative for weakness and numbness.         Past Medical History:  Diagnosis Date   (HFpEF) heart failure with preserved ejection fraction (HCC)    Anxiety    Aortic atherosclerosis (HCC)    CAP (community acquired pneumonia)    COPD (chronic obstructive pulmonary disease) (HCC)    Dyspnea    DUE TO HEAT WITH COPD   Epistaxis 01/09/2017   GERD (gastroesophageal reflux disease)    OCC-NO MEDS   Grade I diastolic dysfunction    Hypertension    Hypothyroidism    Iliac artery stenosis, bilateral (HCC)    New onset of headaches    Osteopenia    PMR (polymyalgia rheumatica) (HCC)    Pneumonia 02/13/2018   PONV (postoperative nausea and vomiting)    VERY NAUSEATED AFTER SURGERY   Shingles    Stasis dermatitis    Strain of chest wall 01/20/2011   Tobacco abuse     Social History   Socioeconomic History   Marital status: Married    Spouse name: [name]   Number of children:  1   Years of education: Not on file   Highest education level: Not on file  Occupational History   Occupation: Therapist, art as a Therapist, art: RETIRED    Comment: retired  Tobacco Use   Smoking status: Former    Packs/day: 1.50    Years: 20.00    Total pack years: 30.00    Types: Cigarettes    Quit date: 02/11/2009    Years since quitting: 12.4   Smokeless tobacco: Never  Vaping Use   Vaping Use: Never used  Substance and Sexual Activity   Alcohol use: No    Alcohol/week: 0.0 standard drinks of alcohol   Drug use: No   Sexual activity: Not Currently  Other Topics Concern   Not on file  Social History Narrative   Lives in [place] with her husband.  Takes care of her two grandchildren, ages 2 and 2, everyday in [place].  Has one son who is 74.   Desires CPR   Would not want prolonged life support if futile   Social Determinants of Health   Financial Resource Strain: Low Risk  (05/01/2021)   Overall Financial Resource Strain (CARDIA)    Difficulty of Paying Living Expenses: Not hard at all  Food Insecurity: No Food Insecurity (05/01/2021)   Hunger Vital Sign    Worried About  Running Out of Food in the Last Year: Never true    [place] in the Last Year: Never true  Transportation Needs: No Transportation Needs (05/01/2021)   PRAPARE - Hydrologist (Medical): No    Lack of Transportation (Non-Medical): No  Physical Activity: Sufficiently Active (05/01/2021)   Exercise Vital Sign    Days of Exercise per Week: 5 days    Minutes of Exercise per Session: 30 min  Stress: No Stress Concern Present (05/01/2021)   [place]    Feeling of Stress : Not at all  Social Connections: [place] (05/01/2021)   Social Connection and Isolation Panel [NHANES]    Frequency of Communication with Friends and Family: More than three times a week    Frequency of Social  Gatherings with Friends and Family: More than three times a week    Attends Religious Services: More than 4 times per year    Active Member of [employer] or Organizations: Yes    Attends Music therapist: More than 4 times per year    Marital Status: Married  Human resources officer Violence: Not At Risk (05/01/2021)   Humiliation, Afraid, Rape, and Kick questionnaire    Fear of Current or Ex-Partner: No    Emotionally Abused: No    Physically Abused: No    Sexually Abused: No    Past Surgical History:  Procedure Laterality Date   ABDOMINAL HYSTERECTOMY     APPENDECTOMY     BACK SURGERY     LUMBAR FUSION   CARPAL TUNNEL RELEASE Left    CARPAL TUNNEL RELEASE Left 08/31/2019   Procedure: CARPAL TUNNEL RELEASE;  Surgeon: Dereck Leep, MD;  Location: [facility] ORS;  Service: Orthopedics;  Laterality: Left;   CARPAL TUNNEL RELEASE Right 06/22/2020   Procedure: CARPAL TUNNEL RELEASE;  Surgeon: Dereck Leep, MD;  Location: [facility] ORS;  Service: Orthopedics;  Laterality: Right;   CERVICAL FUSION     CHOLECYSTECTOMY     HEMORRHOID SURGERY     LUMBAR FUSION     TUBAL LIGATION      Family History  Problem Relation Age of Onset   Heart attack Mother 27   Cancer Father        lung/chest wall    Prostate cancer Father    Diabetes Brother        siblings   Hypertension Sister        x 2   Heart disease Brother    Breast cancer Paternal Grandmother    Heart disease Sister    Diabetes Sister    Tuberculosis Sister    Cirrhosis Brother     Allergies  Allergen Reactions   Epinephrine Anaphylaxis and Shortness Of Breath   Food Rash    MANGO = cause rashes around the mouth LIPS GO NUMB   [employer] Rash    THIS IS MANGO LIPS GO NUMB   Other Rash    MANGO = cause rashes around the mouth MANGO = cause rashes around the mouth LIPS GO NUMB   Prednisone Anaphylaxis, Hives, Shortness Of Breath, Itching and Swelling    The face swells HAS TAKEN CORTISONE SHOTS WITHOUT ISSUE  BEFORE   Milk-Related Compounds Diarrhea    DAIRY PRODUCTS ARE A PROBLEM   Pneumococcal 13-Val Conj Vacc Itching and Other (See Comments)    Arm temp elevated at site of injection, red, itching   Prevnar 13 [Pneumococcal  13-Val Conj Vacc] Other (See Comments)    Arm temp elevated at site of injection, red, itching    Current Outpatient Medications on File Prior to Visit  Medication Sig Dispense Refill   amLODipine (NORVASC) 5 MG tablet TAKE 1 TABLET BY MOUTH EVERY DAY FOR BLOOD PRESSURE 90 tablet 2   Calcium Carb-Cholecalciferol (CALCIUM + VITAMIN D3 PO) Take 1,200 mg by mouth daily.     hydroxychloroquine (PLAQUENIL) 200 MG tablet Take 1 tablet (200 mg total) by mouth 2 (two) times daily. 60 tablet 5   levothyroxine (SYNTHROID) 75 MCG tablet TAKE 1 TABLET BY MOUTH EVERY MORNING ON AN EMPTY STOMACH WITH WATER ONLY. NO FOOD OR OTHER MEDICATIONS FOR 30 MINUTES. 90 tablet 3   losartan (COZAAR) 100 MG tablet Take 1 tablet (100 mg total) by mouth daily. 90 tablet 0   Multiple Vitamins-Minerals (PRESERVISION AREDS 2 PO) Take 1 tablet by mouth in the morning and at bedtime.     tacrolimus (PROTOPIC) 0.1 % ointment Apply to aa's lower legs BID PRN. 60 g 3   albuterol (VENTOLIN HFA) 108 (90 Base) MCG/ACT inhaler Inhale 2 puffs into the lungs every 4 (four) hours as needed for shortness of breath. (Patient not taking: Reported on 08/04/2021) 1 each 0   alendronate (FOSAMAX) 70 MG tablet Take 1 tablet (70 mg total) by mouth every 7 (seven) days. Take with a full glass of water on an empty stomach. (Patient not taking: Reported on 08/04/2021) 12 tablet 2   No current facility-administered medications on file prior to visit.    BP 126/64   Pulse 76   Temp 98.6 F (37 C) (Oral)   Ht 4\' 9"  (1.448 m)   Wt 187 lb (84.8 kg)   SpO2 96%   BMI 40.47 kg/m  Objective:   Physical Exam Pulmonary:     Effort: Pulmonary effort is normal.  Musculoskeletal:     Right shoulder: Normal range of motion. Normal  strength.     Left shoulder: Normal range of motion. Normal strength.     Right upper arm: No tenderness or bony tenderness.       Arms:     Right upper leg: No swelling or tenderness.       Legs:  Skin:    General: Skin is warm and dry.     Findings: No erythema or rash.  Neurological:     Mental Status: She is alert.           Assessment & Plan:   Problem List Items Addressed This Visit       Other   Upper extremity pain, anterior, right    Unclear etiology, does seem to be MSK related, although there was no strain or injury. Doesn't appear to be herpes zoster, low suspicion for CVA. She has normal ROM to shoulder and no pain to shoulder or neck.  Trial of tizanidine 4 mg HS provided. Discussed use of Tylenol PRN.   Sports medicine follow up if no improvement.       Relevant Medications   tiZANidine (ZANAFLEX) 4 MG tablet   Lower extremity pain    Unclear etiology, does seem to be MSK related, although there was no strain or injury. Doesn't appear to be herpes zoster, low suspicion for CVA. She has normal ROM to lumbar spine, no pain to lumbar spine.  Trial of tizanidine 4 mg HS provided. Discussed use of Tylenol PRN.   Sports medicine follow up if no improvement.  Relevant Medications   tiZANidine (ZANAFLEX) 4 MG tablet       Pleas Koch, NP

## 2021-08-22 ENCOUNTER — Ambulatory Visit: Payer: Medicare Other | Admitting: Dermatology

## 2021-08-24 ENCOUNTER — Ambulatory Visit (INDEPENDENT_AMBULATORY_CARE_PROVIDER_SITE_OTHER): Payer: Medicare Other | Admitting: Dermatology

## 2021-08-24 DIAGNOSIS — L719 Rosacea, unspecified: Secondary | ICD-10-CM

## 2021-08-24 NOTE — Patient Instructions (Signed)
Due to recent changes in healthcare laws, you may see results of your pathology and/or laboratory studies on MyChart before the doctors have had a chance to review them. We understand that in some cases there may be results that are confusing or concerning to you. Please understand that not all results are received at the same time and often the doctors may need to interpret multiple results in order to provide you with the best plan of care or course of treatment. Therefore, we ask that you please give us 2 business days to thoroughly review all your results before contacting the office for clarification. Should we see a critical lab result, you will be contacted sooner.   If You Need Anything After Your Visit  If you have any questions or concerns for your doctor, please call our main line at [phone] and press option 4 to reach your doctor's medical assistant. If no one answers, please leave a voicemail as directed and we will return your call as soon as possible. Messages left after 4 pm will be answered the following business day.   You may also send us a message via MyChart. We typically respond to MyChart messages within 1-2 business days.  For prescription refills, please ask your pharmacy to contact our office. Our fax number is [phone].  If you have an urgent issue when the clinic is closed that cannot wait until the next business day, you can [place] your doctor at the number below.    Please note that while we do our best to be available for urgent issues outside of office hours, we are not available 24/7.   If you have an urgent issue and are unable to reach us, you may choose to seek medical care at your doctor's office, retail clinic, urgent care center, or emergency room.  If you have a medical emergency, please immediately call 911 or go to the emergency department.  Pager Numbers  - Dr. Kowalski: [phone]  - Dr. Moye: [phone]  - Dr. Stewart:  [phone]  In the event of inclement weather, please call our main line at [phone] for an update on the status of any delays or closures.  Dermatology Medication Tips: Please keep the boxes that topical medications come in in order to help keep track of the instructions about where and how to use these. Pharmacies typically print the medication instructions only on the boxes and not directly on the medication tubes.   If your medication is too expensive, please contact our office at [phone] option 4 or send us a message through MyChart.   We are unable to tell what your co-pay for medications will be in advance as this is different depending on your insurance coverage. However, we may be able to find a substitute medication at lower cost or fill out paperwork to get insurance to cover a needed medication.   If a prior authorization is required to get your medication covered by your insurance company, please allow us 1-2 business days to complete this process.  Drug prices often vary depending on where the prescription is filled and some pharmacies may offer cheaper prices.  The website www.goodrx.com contains coupons for medications through different pharmacies. The prices here do not account for what the cost may be with help from insurance (it may be cheaper with your insurance), but the website can give you the price if you did not use any insurance.  - You can print the associated coupon and take it with   your prescription to the pharmacy.  - You may also stop by our office during regular business hours and pick up a GoodRx coupon card.  - If you need your prescription sent electronically to a different pharmacy, notify our office through [facility] MyChart or by phone at [phone] option 4.     Si Usted Necesita Algo Despus de Su Visita  Tambin puede enviarnos un mensaje a travs de MyChart. Por lo general respondemos a los mensajes de MyChart en el transcurso de 1 a 2  das hbiles.  Para renovar recetas, por favor pida a su farmacia que se ponga en contacto con nuestra oficina. Nuestro nmero de fax es el [phone].  Si tiene un asunto urgente cuando la clnica est cerrada y que no puede esperar hasta el siguiente da hbil, puede llamar/localizar a su doctor(a) al nmero que aparece a continuacin.   Por favor, tenga en cuenta que aunque hacemos todo lo posible para estar disponibles para asuntos urgentes fuera del horario de oficina, no estamos disponibles las 24 horas del da, los 7 das de la semana.   Si tiene un problema urgente y no puede comunicarse con nosotros, puede optar por buscar atencin mdica  en el consultorio de su doctor(a), en una clnica privada, en un centro de atencin urgente o en una sala de emergencias.  Si tiene una emergencia mdica, por favor llame inmediatamente al 911 o vaya a la sala de emergencias.  Nmeros de bper  - Dr. Kowalski: [phone]  - Dra. Moye: [phone]  - Dra. Stewart: [phone]  En caso de inclemencias del tiempo, por favor llame a nuestra lnea principal al [phone] para una actualizacin sobre el estado de cualquier retraso o cierre.  Consejos para la medicacin en dermatologa: Por favor, guarde las cajas en las que vienen los medicamentos de uso tpico para ayudarle a seguir las instrucciones sobre dnde y cmo usarlos. Las farmacias generalmente imprimen las instrucciones del medicamento slo en las cajas y no directamente en los tubos del medicamento.   Si su medicamento es muy caro, por favor, pngase en contacto con nuestra oficina llamando al [phone] y presione la opcin 4 o envenos un mensaje a travs de MyChart.   No podemos decirle cul ser su copago por los medicamentos por adelantado ya que esto es diferente dependiendo de la cobertura de su seguro. Sin embargo, es posible que podamos encontrar un medicamento sustituto a menor costo o llenar un formulario para que el  seguro cubra el medicamento que se considera necesario.   Si se requiere una autorizacin previa para que su compaa de seguros cubra su medicamento, por favor permtanos de 1 a 2 das hbiles para completar este proceso.  Los precios de los medicamentos varan con frecuencia dependiendo del lugar de dnde se surte la receta y alguna farmacias pueden ofrecer precios ms baratos.  El sitio web www.goodrx.com tiene cupones para medicamentos de diferentes farmacias. Los precios aqu no tienen en cuenta lo que podra costar con la ayuda del seguro (puede ser ms barato con su seguro), pero el sitio web puede darle el precio si no utiliz ningn seguro.  - Puede imprimir el cupn correspondiente y llevarlo con su receta a la farmacia.  - Tambin puede pasar por nuestra oficina durante el horario de atencin regular y recoger una tarjeta de cupones de GoodRx.  - Si necesita que su receta se enve electrnicamente a una farmacia diferente, informe a nuestra oficina a travs de MyChart de [place]   o por telfono llamando al [phone] y presione la opcin 4.  

## 2021-08-24 NOTE — Progress Notes (Signed)
   Follow-Up Visit   Subjective  Teresa Jensen is a 75 y.o. female who presents for the following: Skin Problem (Patient here for a red spot at nose. No hx of skin cancer or precancers. ).  Patient accompanied by husband.   The following portions of the chart were reviewed this encounter and updated as appropriate:   Tobacco  Allergies  Meds  Problems  Med Hx  Surg Hx  Fam Hx      Review of Systems:  No other skin or systemic complaints except as noted in HPI or Assessment and Plan.  Objective  Well appearing patient in no apparent distress; mood and affect are within normal limits.  A focused examination was performed including face. Relevant physical exam findings are noted in the Assessment and Plan.  nose Dilated blood vessels without features suspicious for malignancy on dermoscopy     Assessment & Plan  Rosacea nose  Benign.  Patient deferred treatment at this time.  Rosacea is a chronic progressive skin condition usually affecting the face of adults, causing redness and/or acne bumps. It is treatable but not curable. It sometimes affects the eyes (ocular rosacea) as well. It may respond to topical and/or systemic medication and can flare with stress, sun exposure, alcohol, exercise and some foods.  Daily application of broad spectrum spf 30+ sunscreen to face is recommended to reduce flares.   Return for TBSE, as scheduled.  Graciella Belton, RMA, am acting as scribe for Forest Gleason, MD .  Documentation: I have reviewed the above documentation for accuracy and completeness, and I agree with the above.  Forest Gleason, MD

## 2021-08-28 ENCOUNTER — Encounter: Payer: Self-pay | Admitting: Dermatology

## 2021-08-29 ENCOUNTER — Telehealth: Payer: Self-pay

## 2021-08-29 NOTE — Telephone Encounter (Signed)
-----   Message from Alfonso Patten, MD sent at 08/25/2021 10:39 AM EDT ----- Regarding: update Dr. George Ina Can you please call Dr. Inda Coke office and let them know Mrs. Baucom has been on hydroxychloroquine since February 2022? Please also send them a copy of my note from the February 2022 visit., Thank you!

## 2021-08-29 NOTE — Telephone Encounter (Signed)
[employer]. Advised of Dr. Robbi Garter message regarding hydroxychloroquine. Faxed OV notes from 02/2020 visit. JP

## 2021-08-30 ENCOUNTER — Ambulatory Visit: Payer: Medicare Other | Attending: Internal Medicine

## 2021-08-30 DIAGNOSIS — J8410 Pulmonary fibrosis, unspecified: Secondary | ICD-10-CM | POA: Diagnosis not present

## 2021-08-30 DIAGNOSIS — Z87891 Personal history of nicotine dependence: Secondary | ICD-10-CM | POA: Insufficient documentation

## 2021-08-30 DIAGNOSIS — J449 Chronic obstructive pulmonary disease, unspecified: Secondary | ICD-10-CM | POA: Diagnosis not present

## 2021-08-30 LAB — PULMONARY FUNCTION TEST ARMC ONLY
DL/VA % pred: 56 %
DL/VA: 2.46 ml/min/mmHg/L
DLCO unc % pred: 51 %
DLCO unc: 7.69 ml/min/mmHg
FEF 25-75 Post: 0.6 L/s
FEF 25-75 Pre: 0.46 L/s
FEF2575-%Change-Post: 31 %
FEF2575-%Pred-Post: 48 %
FEF2575-%Pred-Pre: 37 %
FEV1-%Change-Post: 4 %
FEV1-%Pred-Post: 68 %
FEV1-%Pred-Pre: 65 %
FEV1-Post: 0.99 L
FEV1-Pre: 0.95 L
FEV1FVC-%Change-Post: -5 %
FEV1FVC-%Pred-Pre: 89 %
FEV6-%Change-Post: 9 %
FEV6-%Pred-Post: 83 %
FEV6-%Pred-Pre: 76 %
FEV6-Post: 1.55 L
FEV6-Pre: 1.41 L
FEV6FVC-%Change-Post: 0 %
FEV6FVC-%Pred-Post: 106 %
FEV6FVC-%Pred-Pre: 106 %
FVC-%Change-Post: 9 %
FVC-%Pred-Post: 79 %
FVC-%Pred-Pre: 71 %
FVC-Post: 1.55 L
FVC-Pre: 1.41 L
Post FEV1/FVC ratio: 63 %
Post FEV6/FVC ratio: 100 %
Pre FEV1/FVC ratio: 67 %
Pre FEV6/FVC Ratio: 100 %
RV % pred: 139 %
RV: 2.74 L
TLC % pred: 108 %
TLC: 4.34 L

## 2021-08-30 MED ORDER — ALBUTEROL SULFATE (2.5 MG/3ML) 0.083% IN NEBU
2.5000 mg | INHALATION_SOLUTION | Freq: Once | RESPIRATORY_TRACT | Status: AC
  Administered 2021-08-30: 2.5 mg via RESPIRATORY_TRACT

## 2021-08-31 ENCOUNTER — Other Ambulatory Visit: Payer: Self-pay | Admitting: Internal Medicine

## 2021-09-03 ENCOUNTER — Other Ambulatory Visit: Payer: Self-pay | Admitting: Internal Medicine

## 2021-09-07 ENCOUNTER — Ambulatory Visit (INDEPENDENT_AMBULATORY_CARE_PROVIDER_SITE_OTHER): Payer: Medicare Other | Admitting: Internal Medicine

## 2021-09-07 ENCOUNTER — Encounter: Payer: Self-pay | Admitting: Internal Medicine

## 2021-09-07 DIAGNOSIS — J8410 Pulmonary fibrosis, unspecified: Secondary | ICD-10-CM | POA: Diagnosis not present

## 2021-09-07 DIAGNOSIS — J449 Chronic obstructive pulmonary disease, unspecified: Secondary | ICD-10-CM

## 2021-09-07 MED ORDER — PANTOPRAZOLE SODIUM 40 MG PO TBEC: 40.0000 mg | DELAYED_RELEASE_TABLET | Freq: Every day | ORAL | 2 refills | Status: DC

## 2021-09-07 MED ORDER — STIOLTO RESPIMAT 2.5-2.5 MCG/ACT IN AERS: 2.0000 | INHALATION_SPRAY | Freq: Every day | RESPIRATORY_TRACT | 0 refills | Status: DC

## 2021-09-07 MED ORDER — STIOLTO RESPIMAT 2.5-2.5 MCG/ACT IN AERS: INHALATION_SPRAY | RESPIRATORY_TRACT | 11 refills | Status: DC

## 2021-09-07 MED ORDER — FAMOTIDINE 20 MG PO TABS: ORAL_TABLET | ORAL | 11 refills | Status: DC

## 2021-09-07 NOTE — Assessment & Plan Note (Signed)
Onset ? 2020 p pneumonia in pt on plaquenil for  "lipodermatosclerosis"   -  07/27/2021 slow to mod pace walk x 350 ft with lowest sats 90% with ESR  65  - 07/27/2021 collagen vasc dz w/u: neg -07/27/2021  HSP serology not done   See comments re copd vs ild > best way to judge latter is serial sats at peak ex/ rec she monitor  In meantime: Use of PPI is associated with improved survival time and with decreased radiologic fibrosis per King's study published in AJRCCM vol 184 p1390.  Dec 2011 and also may have other beneficial effects as per the latest review in [employer] vol 193 Q2229 Jun 20016.  This may not always be cause and effect, but given how universally underwhelming  (at least in terms of short term benefit)   and expensive  all the other  Drugs developed to day  have been for pf,   rec start  rx ppi / diet/ lifestyle modification and f/u with serial walking sats and lung volumes for now to put more points on the curve / establish firm baseline before considering additional measures.         Each maintenance medication was reviewed in detail including emphasizing most importantly the difference between maintenance and prns and under what circumstances the prns are to be triggered using an action plan format where appropriate.  Total time for H and P, chart review, counseling, reviewing smi device(s) and generating customized AVS unique to this office visit / same day charting > 30 min for multiple  refractory respiratory  symptoms of uncertain etiology

## 2021-09-07 NOTE — Assessment & Plan Note (Addendum)
Quit smoking in 2012  - PFT's 06/2010:  FEV1 1.20 (56%), ratio 68, significant airtrapping, DLCO 46% but nearly corrects with Alv volume  - Labs ordered 07/27/2021  :  allergy screen Eos 0.1  IgE 3  alpha one AT phenotype  MM 168  - 07/27/2021   Walked on RA  x  2  lap(s) =  approx 350  ft  @ slow to mod pace, stopped due to "tired" at end  with lowest 02 sats 90%  - PFT's  08/30/21  FEV1 0.99 (68 % ) ratio 0.63  p 4 % improvement from saba p 0 prior to study with DLCO  7.69 (51%)  and FV curve concave  - 09/07/2021  After extensive coaching inhaler device,  effectiveness =    75% with smi > try stiolto x 2 week sample   There is no convincing evidence of progressive ILD with main finding c/w copd but ideally would need HRCT to sort this out -  The elevated ESR is 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  F/u in 6 weeks, call sooner if needed

## 2021-09-07 NOTE — Progress Notes (Signed)
Teresa Jensen, female    DOB: [birth year]   MRN: [id number]   Brief patient profile:  73  yowf quit smoking  2011 @ wt 173  with cough / doe x decades which did improve  referred to pulmonary clinic in [facility]  07/27/2021 by NP Gentry Fitz   for copd/abn cxr         PFT's 06/2010: FEV1 1.20 (56%), ratio 68, significant airtrapping, DLCO 46% but nearly corrects with Alv vol     History of Present Illness  07/27/2021  Pulmonary/ 1st office eval/ Midge Momon / [employer] / on plaquenil for "lipodermatosclerosis" per derm Dyspnea:  MMRC2 = can't walk a nl pace on a flat grade s sob but does fine slow and flat  Cough: better but several am's  week wakes up with clear mucus < 1 tsp  Sleep: bed flat lots of pillows under head 30 degrees due to gerd  SABA use: none  Bad noct gerd since taking fosamax x ? One year/ rx per pcp ? Pepcid did not help Rec Try off fosamax for the next 4 weeks  GERD  diet/ lifestyle Please remember to go to the lab department   ESR 65 .       09/07/2021  f/u ov/[facility]/ [facility] re: GOLD 2 copd with ? PF   maint on no rx   Chief Complaint  Patient presents with   Follow-up  Dyspnea:  MMRC2 = can't walk a nl pace on a flat grade s sob but does fine slow and flat  Sob and tired about the same / [employer] does ok pushing cart  = MMRC2 = can't walk a nl pace on a flat grade s sob but does fine slow and flat   Cough: clear mucus / p supper  Sleeping: 30 degrees does fine  SABA use: none  02: none  Covid status:   vax x 3/ twice infected one year prior   No obvious day to day or daytime variability or assoc excess/ purulent sputum or mucus plugs or hemoptysis or cp or chest tightness, subjective wheeze or overt sinus or hb symptoms.   Sleeping as above without nocturnal  or early am exacerbation  of respiratory  c/o's or need for noct saba. Also denies any obvious fluctuation of symptoms with weather or environmental changes or other aggravating or alleviating  factors except as outlined above   No unusual exposure hx or h/o childhood pna/ asthma or knowledge of premature birth.  Current Allergies, Complete Past Medical History, Past Surgical History, Family History, and Social History were reviewed in [employer] record.  ROS  The following are not active complaints unless bolded Hoarseness, sore throat, dysphagia, dental problems, itching, sneezing,  nasal congestion or discharge of excess mucus or purulent secretions, ear ache,   fever, chills, sweats, unintended wt loss or wt gain, classically pleuritic or exertional cp,  orthopnea pnd or arm/hand swelling  or leg swelling, presyncope, palpitations, abdominal pain, anorexia, nausea, vomiting, diarrhea  or change in bowel habits or change in bladder habits, change in stools or change in urine, dysuria, hematuria,  rash, arthralgias, visual complaints, headache, numbness, weakness or ataxia or problems with walking or coordination,  change in mood or  memory.        Current Meds  Medication Sig   amLODipine (NORVASC) 5 MG tablet TAKE 1 TABLET BY MOUTH EVERY DAY FOR BLOOD PRESSURE   Calcium Carb-Cholecalciferol (CALCIUM + VITAMIN  D3 PO) Take 1,200 mg by mouth daily.   hydroxychloroquine (PLAQUENIL) 200 MG tablet Take 1 tablet (200 mg total) by mouth 2 (two) times daily.   levothyroxine (SYNTHROID) 75 MCG tablet TAKE 1 TABLET BY MOUTH EVERY MORNING ON AN EMPTY STOMACH WITH WATER ONLY. NO FOOD OR OTHER MEDICATIONS FOR 30 MINUTES.   losartan (COZAAR) 100 MG tablet TAKE 1 TABLET BY MOUTH EVERY DAY   Multiple Vitamins-Minerals (PRESERVISION AREDS 2 PO) Take 1 tablet by mouth in the morning and at bedtime.   tacrolimus (PROTOPIC) 0.1 % ointment Apply to aa's lower legs BID PRN.                   Past Medical History:  Diagnosis Date   (HFpEF) heart failure with preserved ejection fraction (HCC)    Anxiety    Aortic atherosclerosis (HCC)    CAP (community acquired pneumonia)     COPD (chronic obstructive pulmonary disease) ([place])    Dyspnea    DUE TO HEAT WITH COPD   Epistaxis 01/09/2017   GERD (gastroesophageal reflux disease)    OCC-NO MEDS   Grade I diastolic dysfunction    Hypertension    Hypothyroidism    Iliac artery stenosis, bilateral ([place])    New onset of headaches    Osteopenia    PMR (polymyalgia rheumatica) (HCC)    Pneumonia 02/13/2018   PONV (postoperative nausea and vomiting)    VERY NAUSEATED AFTER SURGERY   Shingles    Stasis dermatitis    Strain of chest wall 01/20/2011   Tobacco abuse         Objective:     Wt Readings from Last 3 Encounters:  09/07/21 190 lb (86.2 kg)  08/04/21 187 lb (84.8 kg)  07/27/21 184 lb 12.8 oz (83.8 kg)      Vital signs reviewed  09/07/2021  - Note at rest 02 sats  93% on RA   General appearance:    hoarse amb wf nad      HEENT : Oropharynx  clear   Nasal turbinates nl    NECK :  without  apparent JVD/ palpable Nodes/TM    LUNGS: no acc muscle use,  Mild barrel  contour chest wall with bilateral  Distant bs s audible wheeze and  without cough on insp or exp maneuvers  and mild  Hyperresonant  to  percussion bilaterally     CV:  RRR  no s3 or murmur or increase in P2, and elastic both legs   ABD:  soft and nontender with pos end  insp Hoover's  in the supine position.  No bruits or organomegaly appreciated   MS:  Nl gait/ ext warm without deformities Or obvious joint restrictions  calf tenderness, cyanosis or clubbing     SKIN: warm and dry without lesions    NEURO:  alert, approp, nl sensorium with  no motor or cerebellar deficits apparent.                Assessment

## 2021-09-07 NOTE — Patient Instructions (Addendum)
Talk to your dermatologist and Alma Friendly NP about your skin condition   Stiolto 2 puffs each am and you should notice better wind when you walk  Work on inhaler technique:  relax and gently blow all the way out then take a nice smooth full deep breath back in, triggering the inhaler at same time you start breathing in.  Hold breath in for at least  5 seconds if you can.   Rinse and gargle with water when done.  If mouth or throat bother you at all,  try brushing teeth/gums/tongue with arm and hammer toothpaste/ make a slurry and gargle and spit out.     To get the most out of exercise, you need to be continuously aware that you are short of breath, but never out of breath, for at least 30 minutes daily. As you improve, it will actually be easier for you to do the same amount of exercise  in  30 minutes so always push to the level where you are short of breath.     Make sure you check your oxygen saturations at highest level of activity   Pantoprazole (protonix) 40 mg   Take  30-60 min before first meal of the day and Pepcid (famotidine)  20 mg after supper until return to office - this is the best way to tell whether stomach acid is contributing to your problem.    Please schedule a follow up office visit in 6 weeks, call sooner if needed - bring inhaler  Late add: Needs walking sats next ov and consideration for HRCT

## 2021-09-21 ENCOUNTER — Other Ambulatory Visit (HOSPITAL_COMMUNITY): Payer: Self-pay

## 2021-10-03 ENCOUNTER — Telehealth: Payer: Self-pay | Admitting: *Deleted

## 2021-10-03 DIAGNOSIS — J441 Chronic obstructive pulmonary disease with (acute) exacerbation: Secondary | ICD-10-CM

## 2021-10-03 NOTE — Patient Outreach (Signed)
  Care Coordination   10/03/2021 Name: Teresa Jensen MRN: [id number] DOB: 1945/06/19   Care Coordination Outreach Attempts:  An unsuccessful telephone outreach was attempted today to offer the patient information about available care coordination services as a benefit of their health plan.   Follow Up Plan:  Additional outreach attempts will be made to offer the patient care coordination information and services.   Encounter Outcome:  No Answer  Care Coordination Interventions Activated:  Yes   Care Coordination Interventions:  No, not indicated    [place] Management [phone]

## 2021-10-03 NOTE — Patient Outreach (Signed)
  Care Coordination   Initial Visit Note   10/03/2021 Name: Teresa Jensen MRN: [id number] DOB: [birth year]  Teresa Jensen is a 76 y.o. year old female who sees Pleas Koch, NP for primary care. I spoke with  Teresa Jensen by phone today.  What matters to the patients health and wellness today?  My Dr ordered my respiratory inhale and it was denied by my insurance. I cannot afford this.    Goals Addressed             This Visit's Progress    Patient advised to follow up with AWV and Vaccines            SDOH assessments and interventions completed:  Yes     Care Coordination Interventions Activated:  Yes  Care Coordination Interventions:  Yes, provided   Follow up plan: Referral made to Pharmacy    Encounter Outcome:  Pt. Visit Completed  [place] Management [phone]

## 2021-10-10 ENCOUNTER — Telehealth: Payer: Self-pay

## 2021-10-10 DIAGNOSIS — Z596 Low income: Secondary | ICD-10-CM

## 2021-10-10 NOTE — Progress Notes (Signed)
[facility]) Care Management  [place]   10/10/2021  Teresa Jensen 1945-11-15 [id number]  Reason for referral: Medication Assistance  Referral source: [place] Management RN Referral medication(s): Stiolto Respimat  Current insurance:[employer] Part D Prescription Coverage    Medication Review Findings:  Per discussion with Teresa Jensen, she does not qualify for Stiolto Respimat medication assistance due to her combined income with spouse. Additionally, I called [employer]'s COPD Inhaler Program, with patient on the phone and she does not qualify for the program. Of note, patient may qualify for Bevespi and patient consented to switching to therapy if pulmonologist approve therapeutic equivalent changed.   Medication Assistance Findings:  Medication assistance needs identified: Bevespi      Additional medication assistance options reviewed with patient as warranted:  No other options identified  Plan: I will route patient assistance letter to [place] technician who will coordinate patient assistance program application process for medications listed above.  [facility] pharmacy technician will assist with obtaining all required documents from both patient and provider(s) and submit application(s) once completed.    Kristeen Miss, PharmD Clinical Pharmacist [place] Cell: [phone]

## 2021-10-12 ENCOUNTER — Ambulatory Visit (INDEPENDENT_AMBULATORY_CARE_PROVIDER_SITE_OTHER): Payer: Medicare Other | Admitting: Primary Care

## 2021-10-12 VITALS — BP 132/60 | HR 82 | Temp 98.1°F | Ht <= 58 in | Wt 192.0 lb

## 2021-10-12 DIAGNOSIS — M79604 Pain in right leg: Secondary | ICD-10-CM

## 2021-10-12 DIAGNOSIS — Z23 Encounter for immunization: Secondary | ICD-10-CM

## 2021-10-12 MED ORDER — TIZANIDINE HCL 4 MG PO TABS: 4.0000 mg | ORAL_TABLET | Freq: Every evening | ORAL | 0 refills | Status: DC | PRN

## 2021-10-12 NOTE — Patient Instructions (Signed)
You may take the Tizanidine at bedtime as needed for muscle tightness.  You will be contacted regarding your referral to physical therapy.  Please let us know if you have not been contacted within two weeks.   It was a pleasure to see you today!

## 2021-10-12 NOTE — Progress Notes (Signed)
Established Patient Office Visit  Subjective   Patient ID: Teresa Jensen, female    DOB: Aug 27, 1945  Age: 76 y.o. MRN: [id number]  Chief Complaint  Patient presents with   Muscle Pain    Right leg muscle tightness for a couple of days when standing or walking    Muscle Pain    Teresa Jensen is a 76 year old female with past medical history of hypertension, chronic heart failure with preserved ejection fraction, COPD, GERD, Hypothyroidism, osteopororisis, anemia, prediabetes presents today for lower extremity pain.   She has been having right leg muscle tightness that started two weeks ago. States that it does not hurt but is causes a lot of discomfort. She takes tylenol and did not get any relief. Pain does not radiate. It is affecting her physical activity. She has tried heat and did help some. She denies any trauma, swelling, rash, weakness, numbness or radiation of pain. She has constant discomfort even if she has been walking for long periods of time. She does not use any walker, cane or wheelchair.   She was seen in August for a similar pain and was prescribed Tizanidine. She reports that it did help her symptoms.   Patient Active Problem List   Diagnosis Date Noted   Upper extremity pain, anterior, right 08/04/2021   Lower extremity pain 08/04/2021   Postinflammatory pulmonary fibrosis ([place]) 07/27/2021   Acute cough 05/05/2021   Acute gastritis 05/01/2021   Morbid obesity ([place]) 05/01/2021   Grief 10/12/2020   Foot callus 09/01/2020   S/P carpal tunnel release 08/04/2020   Rash and nonspecific skin eruption 07/07/2020   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   Personal history of colonic polyps 11/01/2019   Bilateral carpal tunnel syndrome 08/16/2019   Skin mass 04/30/2019   Chronic heart failure with preserved ejection fraction (HFpEF) ([place]) 11/12/2018   DOE (dyspnea on exertion) 11/12/2018   Abnormal electrocardiogram 11/12/2018   Left leg pain 04/17/2018    COPD exacerbation ([place]) 01/21/2018   Osteoporosis 12/16/2017   Back pain 06/06/2017   Low vitamin B12 level 01/13/2017   Iatrogenic hypotension 01/12/2017   Acute kidney failure ([place]) 01/12/2017   Anemia 01/12/2017   Essential hypertension 01/08/2017   Epistaxis 01/07/2017   Hand numbness 10/22/2016   Right hand weakness 10/22/2016   Closed fracture of metatarsal bone 09/28/2016   Prediabetes 10/17/2015   Herpes zoster 07/08/2015   Pain of right lower extremity 05/20/2015   Medicare annual wellness visit, subsequent 10/12/2014   Occipital neuralgia 10/12/2014   Vitamin D deficiency 10/12/2014   Erythema of lower extremity 09/08/2013   Persistent cough 06/14/2010   COPD GOLD 2  06/05/2010   Hypothyroidism 09/29/2009   Situational anxiety 10/01/2008   COPD 10/01/2008   GERD 10/01/2008   Osteoarthritis 10/01/2008   Past Medical History:  Diagnosis Date   (HFpEF) heart failure with preserved ejection fraction (HCC)    Anxiety    Aortic atherosclerosis ([place])    CAP (community acquired pneumonia)    COPD (chronic obstructive pulmonary disease) ([place])    Dyspnea    DUE TO HEAT WITH COPD   Epistaxis 01/09/2017   GERD (gastroesophageal reflux disease)    OCC-NO MEDS   Grade I diastolic dysfunction    Hypertension    Hypothyroidism    Iliac artery stenosis, bilateral ([place])    New onset of headaches    Osteopenia    PMR (polymyalgia rheumatica) ([place])    Pneumonia 02/13/2018   PONV (  postoperative nausea and vomiting)    VERY NAUSEATED AFTER SURGERY   Shingles    Stasis dermatitis    Strain of chest wall 01/20/2011   Tobacco abuse    Past Surgical History:  Procedure Laterality Date   ABDOMINAL HYSTERECTOMY     APPENDECTOMY     BACK SURGERY     LUMBAR FUSION   CARPAL TUNNEL RELEASE Left    CARPAL TUNNEL RELEASE Left 08/31/2019   Procedure: CARPAL TUNNEL RELEASE;  Surgeon: Dereck Leep, MD;  Location: [facility] ORS;  Service: Orthopedics;  Laterality: Left;   CARPAL  TUNNEL RELEASE Right 06/22/2020   Procedure: CARPAL TUNNEL RELEASE;  Surgeon: Dereck Leep, MD;  Location: [facility] ORS;  Service: Orthopedics;  Laterality: Right;   CERVICAL FUSION     CHOLECYSTECTOMY     HEMORRHOID SURGERY     LUMBAR FUSION     TUBAL LIGATION     Social History   Tobacco Use   Smoking status: Former    Packs/day: 1.50    Years: 20.00    Total pack years: 30.00    Types: Cigarettes    Quit date: 02/11/2009    Years since quitting: 12.6   Smokeless tobacco: Never  Vaping Use   Vaping Use: Never used  Substance Use Topics   Alcohol use: No    Alcohol/week: 0.0 standard drinks of alcohol   Drug use: No   Family History  Problem Relation Age of Onset   Heart attack Mother 55   Cancer Father        lung/chest wall    Prostate cancer Father    Diabetes Brother        siblings   Hypertension Sister        x 2   Heart disease Brother    Breast cancer Paternal Grandmother    Heart disease Sister    Diabetes Sister    Tuberculosis Sister    Cirrhosis Brother    Allergies  Allergen Reactions   Epinephrine Anaphylaxis and Shortness Of Breath   Food Rash    MANGO = cause rashes around the mouth LIPS GO NUMB   Mangifera Indica Rash    THIS IS MANGO LIPS GO NUMB   Other Rash    MANGO = cause rashes around the mouth MANGO = cause rashes around the mouth LIPS GO NUMB   Prednisone Anaphylaxis, Hives, Shortness Of Breath, Itching and Swelling    The face swells HAS TAKEN CORTISONE SHOTS WITHOUT ISSUE BEFORE   Milk-Related Compounds Diarrhea    DAIRY PRODUCTS ARE A PROBLEM   Pneumococcal 13-Val Conj Vacc Itching and Other (See Comments)    Arm temp elevated at site of injection, red, itching   Prevnar 13 [Pneumococcal 13-Val Conj Vacc] Other (See Comments)    Arm temp elevated at site of injection, red, itching      Review of Systems  Musculoskeletal:  Positive for myalgias. Negative for back pain, falls and joint pain.  Neurological:  Negative for  tingling.      Objective:     BP 132/60   Pulse 82   Temp 98.1 F (36.7 C) (Temporal)   Ht 4\' 9"  (1.448 m)   Wt 192 lb (87.1 kg)   SpO2 98%   BMI 41.55 kg/m  BP Readings from Last 3 Encounters:  10/12/21 132/60  09/07/21 (!) 140/60  08/04/21 126/64   Wt Readings from Last 3 Encounters:  10/12/21 192 lb (87.1 kg)  09/07/21 190 lb (  86.2 kg)  08/04/21 187 lb (84.8 kg)      Physical Exam Cardiovascular:     Rate and Rhythm: Normal rate and regular rhythm.     Pulses: Normal pulses.          Popliteal pulses are 2+ on the right side and 2+ on the left side.       Dorsalis pedis pulses are 2+ on the right side and 2+ on the left side.       Posterior tibial pulses are 2+ on the right side and 2+ on the left side.     Heart sounds: Normal heart sounds.  Musculoskeletal:        General: Tenderness present. No swelling.     Right lower leg: No edema.     Left lower leg: No edema.      No results found for any visits on 10/12/21.     The 10-year ASCVD risk score (Arnett DK, et al., 2019) is: 24.1%    Assessment & Plan:   Problem List Items Addressed This Visit       Other   Lower extremity pain    Unclear etiology. Does seem musculoskeletal related.   Normal range of motion on exam.   Will refill Tizanidine as it did provide her relief before. Agreeable to start physical therapy.        Relevant Medications   tiZANidine (ZANAFLEX) 4 MG tablet   Other Visit Diagnoses     Need for immunization against influenza    -  Primary   Relevant Orders   Flu Vaccine QUAD High Dose(Fluad) (Completed)       No follow-ups on file.    Tinnie Gens, BSN-RN, DNP STUDENT

## 2021-10-12 NOTE — Assessment & Plan Note (Addendum)
Seems to be musculoskeletal related.  No obvious lumbar or hip cause, although her pain could be referred from hip/back.  Exam today without alarm signs.  Will refill Tizanidine as it did provide her relief before.  Recommended PT for further evaluation. She is agreeable to start physical therapy, referral placed.   I evaluated patient, was consulted regarding treatment, and agree with assessment and plan per Tinnie Gens, RN, DNP student.   Allie Bossier, NP-C

## 2021-10-12 NOTE — Progress Notes (Signed)
Subjective:    Patient ID: Teresa Jensen, female    DOB: [birth year], 76 y.o.   MRN: [id number]  Muscle Pain    Teresa Jensen is a very pleasant 76 y.o. female with a history of CHF, hypertension, hypothyroidism, carpal tunnel syndrome, osteoarthritis, lower extremity pain, shoulder pain who presents today to discuss lower extremity pain.   Her pain is located to the right lateral and anterior upper portion of lower extremity to thigh area which began 2-3 months ago. She describes her pain as a discomfort and muscle tightness that is intermittent. Sometimes she will notice her muscle tightness with walking.   Her muscle tightness is improved with Tizanidine and rest. She denies lower back pain, hip pain, trauma, overuse of right lower extremity, weakness, numbness.    Review of Systems  Musculoskeletal:  Positive for myalgias.  Skin:  Negative for color change.         Past Medical History:  Diagnosis Date   (HFpEF) heart failure with preserved ejection fraction (HCC)    Anxiety    Aortic atherosclerosis (HCC)    CAP (community acquired pneumonia)    COPD (chronic obstructive pulmonary disease) (HCC)    Dyspnea    DUE TO HEAT WITH COPD   Epistaxis 01/09/2017   GERD (gastroesophageal reflux disease)    OCC-NO MEDS   Grade I diastolic dysfunction    Hypertension    Hypothyroidism    Iliac artery stenosis, bilateral (HCC)    New onset of headaches    Osteopenia    PMR (polymyalgia rheumatica) (HCC)    Pneumonia 02/13/2018   PONV (postoperative nausea and vomiting)    VERY NAUSEATED AFTER SURGERY   Shingles    Stasis dermatitis    Strain of chest wall 01/20/2011   Tobacco abuse     Social History   Socioeconomic History   Marital status: Married    Spouse name: [name]   Number of children: 1   Years of education: Not on file   Highest education level: Not on file  Occupational History   Occupation: Therapist, art as a Therapist, art: RETIRED     Comment: retired  Tobacco Use   Smoking status: Former    Packs/day: 1.50    Years: 20.00    Total pack years: 30.00    Types: Cigarettes    Quit date: 02/11/2009    Years since quitting: 12.6   Smokeless tobacco: Never  Vaping Use   Vaping Use: Never used  Substance and Sexual Activity   Alcohol use: No    Alcohol/week: 0.0 standard drinks of alcohol   Drug use: No   Sexual activity: Not Currently  Other Topics Concern   Not on file  Social History Narrative   Lives in [place] with her husband.  Takes care of her two grandchildren, ages 71 and 2, everyday in [place].  Has one son who is 64.   Desires CPR   Would not want prolonged life support if futile   Social Determinants of Health   Financial Resource Strain: Low Risk  (05/01/2021)   Overall Financial Resource Strain (CARDIA)    Difficulty of Paying Living Expenses: Not hard at all  Food Insecurity: No Food Insecurity (05/01/2021)   Hunger Vital Sign    Worried About Running Out of Food in the Last Year: Never true    Ran Out of Food in the Last Year: Never true  Transportation Needs: No Transportation Needs (05/01/2021)  PRAPARE - Hydrologist (Medical): No    Lack of Transportation (Non-Medical): No  Physical Activity: Sufficiently Active (05/01/2021)   Exercise Vital Sign    Days of Exercise per Week: 5 days    Minutes of Exercise per Session: 30 min  Stress: No Stress Concern Present (05/01/2021)   [place]    Feeling of Stress : Not at all  Social Connections: Media (05/01/2021)   Social Connection and Isolation Panel [NHANES]    Frequency of Communication with Friends and Family: More than three times a week    Frequency of Social Gatherings with Friends and Family: More than three times a week    Attends Religious Services: More than 4 times per year    Active Member of [employer] or Organizations: Yes     Attends Music therapist: More than 4 times per year    Marital Status: Married  Human resources officer Violence: Not At Risk (05/01/2021)   Humiliation, Afraid, Rape, and Kick questionnaire    Fear of Current or Ex-Partner: No    Emotionally Abused: No    Physically Abused: No    Sexually Abused: No    Past Surgical History:  Procedure Laterality Date   ABDOMINAL HYSTERECTOMY     APPENDECTOMY     BACK SURGERY     LUMBAR FUSION   CARPAL TUNNEL RELEASE Left    CARPAL TUNNEL RELEASE Left 08/31/2019   Procedure: CARPAL TUNNEL RELEASE;  Surgeon: Dereck Leep, MD;  Location: [facility] OR[facility];  Service: Orthopedics;  Laterality: Left;   CARPAL TUNNEL RELEASE Right 06/22/2020   Procedure: CARPAL TUNNEL RELEASE;  Surgeon: Dereck Leep, MD;  Location: [facility] OR[facility];  Service: Orthopedics;  Laterality: Right;   CERVICAL FUSION     CHOLECYSTECTOMY     HEMORRHOID SURGERY     LUMBAR FUSION     TUBAL LIGATION      Family History  Problem Relation Age of Onset   Heart attack Mother 67   Cancer Father        lung/chest wall    Prostate cancer Father    Diabetes Brother        siblings   Hypertension Sister        x 2   Heart disease Brother    Breast cancer Paternal Grandmother    Heart disease Sister    Diabetes Sister    Tuberculosis Sister    Cirrhosis Brother     Allergies  Allergen Reactions   Epinephrine Anaphylaxis and Shortness Of Breath   Food Rash    MANGO = cause rashes around the mouth LIPS GO NUMB   Mangifera [employer] Rash    THIS IS MANGO LIPS GO NUMB   Other Rash    MANGO = cause rashes around the mouth MANGO = cause rashes around the mouth LIPS GO NUMB   Prednisone Anaphylaxis, Hives, Shortness Of Breath, Itching and Swelling    The face swells HAS TAKEN CORTISONE SHOTS WITHOUT ISSUE BEFORE   Milk-Related Compounds Diarrhea    DAIRY PRODUCTS ARE A PROBLEM   Pneumococcal 13-Val Conj Vacc Itching and Other (See Comments)    Arm temp elevated at site of  injection, red, itching   Prevnar 13 [Pneumococcal 13-Val Conj Vacc] Other (See Comments)    Arm temp elevated at site of injection, red, itching    Current Outpatient Medications on File Prior to Visit  Medication Sig  Dispense Refill   amLODipine (NORVASC) 5 MG tablet TAKE 1 TABLET BY MOUTH EVERY DAY FOR BLOOD PRESSURE 90 tablet 2   Calcium Carb-Cholecalciferol (CALCIUM + VITAMIN D3 PO) Take 1,200 mg by mouth daily.     famotidine (PEPCID) 20 MG tablet One after supper 30 tablet 11   hydroxychloroquine (PLAQUENIL) 200 MG tablet Take 1 tablet (200 mg total) by mouth 2 (two) times daily. 60 tablet 5   levothyroxine (SYNTHROID) 75 MCG tablet TAKE 1 TABLET BY MOUTH EVERY MORNING ON AN EMPTY STOMACH WITH WATER ONLY. NO FOOD OR OTHER MEDICATIONS FOR 30 MINUTES. 90 tablet 3   losartan (COZAAR) 100 MG tablet TAKE 1 TABLET BY MOUTH EVERY DAY 90 tablet 0   Multiple Vitamins-Minerals (PRESERVISION AREDS 2 PO) Take 1 tablet by mouth in the morning and at bedtime.     pantoprazole (PROTONIX) 40 MG tablet Take 1 tablet (40 mg total) by mouth daily. Take 30-60 min before first meal of the day 30 tablet 2   tacrolimus (PROTOPIC) 0.1 % ointment Apply to aa's lower legs BID PRN. 60 g 3   Tiotropium Bromide-Olodaterol (STIOLTO RESPIMAT) 2.5-2.5 MCG/ACT AERS 2  puffs each am (Patient not taking: Reported on 10/12/2021) 4 g 11   No current facility-administered medications on file prior to visit.    BP 132/60   Pulse 82   Temp 98.1 F (36.7 C) (Temporal)   Ht 4\' 9"  (1.448 m)   Wt 192 lb (87.1 kg)   SpO2 98%   BMI 41.55 kg/m  Objective:   Physical Exam Constitutional:      General: She is not in acute distress. Pulmonary:     Effort: Pulmonary effort is normal.  Musculoskeletal:     Lumbar back: Normal range of motion. Negative right straight leg raise test and negative left straight leg raise test.       Legs:     Comments: 5/5 strength to right and left lower extremities. Able to get up and  down from exam table without difficulty.             Assessment & Plan:   Problem List Items Addressed This Visit       Other   Lower extremity pain - Primary    Seems to be musculoskeletal related.  No obvious lumbar or hip cause, although her pain could be referred from hip/back.  Exam today without alarm signs.  Will refill Tizanidine as it did provide her relief before.  Recommended PT for further evaluation. She is agreeable to start physical therapy, referral placed.   I evaluated patient, was consulted regarding treatment, and agree with assessment and plan per Tinnie Gens, RN, DNP student.   Allie Bossier, NP-C       Relevant Medications   tiZANidine (ZANAFLEX) 4 MG tablet   Other Relevant Orders   Ambulatory referral to Physical Therapy   Other Visit Diagnoses     Need for immunization against influenza       Relevant Orders   Flu Vaccine QUAD High Dose(Fluad) (Completed)          Pleas Koch, NP

## 2021-10-15 ENCOUNTER — Other Ambulatory Visit: Payer: Self-pay | Admitting: Primary Care

## 2021-10-15 DIAGNOSIS — M79604 Pain in right leg: Secondary | ICD-10-CM

## 2021-10-19 ENCOUNTER — Ambulatory Visit: Payer: Medicare Other | Admitting: Medical

## 2021-10-19 ENCOUNTER — Telehealth: Payer: Self-pay

## 2021-10-19 ENCOUNTER — Telehealth: Payer: Self-pay | Admitting: *Deleted

## 2021-10-19 ENCOUNTER — Ambulatory Visit (INDEPENDENT_AMBULATORY_CARE_PROVIDER_SITE_OTHER): Payer: Medicare Other | Admitting: Internal Medicine

## 2021-10-19 ENCOUNTER — Encounter: Payer: Self-pay | Admitting: Internal Medicine

## 2021-10-19 ENCOUNTER — Other Ambulatory Visit (HOSPITAL_COMMUNITY): Payer: Self-pay

## 2021-10-19 VITALS — BP 104/70 | HR 101 | Temp 97.8°F | Ht <= 58 in | Wt 191.6 lb

## 2021-10-19 DIAGNOSIS — J8410 Pulmonary fibrosis, unspecified: Secondary | ICD-10-CM

## 2021-10-19 DIAGNOSIS — J449 Chronic obstructive pulmonary disease, unspecified: Secondary | ICD-10-CM | POA: Diagnosis not present

## 2021-10-19 MED ORDER — BREZTRI AEROSPHERE 160-9-4.8 MCG/ACT IN AERO: 2.0000 | INHALATION_SPRAY | Freq: Two times a day (BID) | RESPIRATORY_TRACT | 0 refills | Status: DC

## 2021-10-19 MED ORDER — BEVESPI AEROSPHERE 9-4.8 MCG/ACT IN AERO: 2.0000 | INHALATION_SPRAY | Freq: Two times a day (BID) | RESPIRATORY_TRACT | 11 refills | Status: DC

## 2021-10-19 NOTE — Telephone Encounter (Signed)
Patient called Left on the voicemail for [facility] and Dr Laurence Ferrari, please disregard her last message, she will speak to Dr Laurence Ferrari during her next appointment, she doesn't need a call back, she will wait until her appt to talk to Dr Laurence Ferrari

## 2021-10-19 NOTE — Telephone Encounter (Signed)
Ok thank you 

## 2021-10-19 NOTE — Telephone Encounter (Signed)
Looking into this it seems that the patient is still needing to meet her $505.00 deductible. Per test claims the Stiolto has a co-pay of $261.24 with $189.10 being attributed to the deductible. The other LABA/LAMA of Bevespi has a co-pay of $292.99 with $189.10 being applied to the remaining deductible.

## 2021-10-19 NOTE — Assessment & Plan Note (Signed)
Onset ? 2020 p pneumonia in pt on plaquenil for  "lipodermatosclerosis"   -  07/27/2021 slow to mod pace walk x 350 ft with lowest sats 90% with ESR  65  - 07/27/2021 collagen vasc dz w/u: neg -07/27/2021  HSP serology not done  - HRCT 10/19/2021 >>> - PFT's 10/19/2021 >>>   Literature review neg for assoc between her skin problems which are related to chronic venous stasis and her lung dz so may be developing IPF rather than NSIP   Next steps are pfts/hrct > f/u in 3 months with referral to PF clinic likely in meantime  Discussed in detail all the  indications, usual  risks and alternatives  relative to the benefits with patient who agrees to proceed with w/u as outlined.     Each maintenance medication was reviewed in detail including emphasizing most importantly the difference between maintenance and prns and under what circumstances the prns are to be triggered using an action plan format where appropriate.  Total time for H and P, chart review, counseling, reviewing hfa device(s) , directly observing portions of ambulatory 02 saturation study/ and generating customized AVS unique to this office visit / same day charting  >  40 min for chronic refractory resp symptoms of multiple origin

## 2021-10-19 NOTE — Telephone Encounter (Signed)
Patient called with questions on plaquenil. She saw her lung dr yesterday and he told her that taking the plaquenil can affect your lungs. 02/29/2020 starting date for plaquenil 200 mg BID.  Lurlean Horns., RMA

## 2021-10-19 NOTE — Patient Instructions (Signed)
Try breztri (Bevespi similar and may be covered) Take 2 puffs first thing in am and then another 2 puffs about 12 hours later.   Work on inhaler technique:  relax and gently blow all the way out then take a nice smooth full deep breath back in, triggering the inhaler at same time you start breathing in.  Hold breath in for at least  5 seconds if you can. Rinse and gargle with water when done.  If mouth or throat bother you at all,  try brushing teeth/gums/tongue with arm and hammer toothpaste/ make a slurry and gargle and spit out.  - take practice breaths with empty device before using the loaded ones  Make sure you check your oxygen saturation at your highest level of activity to be sure it stays over 90% and keep track of it at least once a week, more often if breathing getting worse, and let me know if losing ground.   My office will be contacting you by phone for referral to High resolution CT  of chest and PFTs   - if you don't hear back from my office within one week please call us back or notify us thru MyChart and we'll address it right away.   Please schedule a follow up visit in 3 months but call sooner if needed

## 2021-10-19 NOTE — Telephone Encounter (Signed)
Please advise that I am not aware of any issues regarding plaquenil being harmful for the lungs (and did not find anything on my literature review) and it can be used to help with some inflammatory conditions of the lungs. However, her lung doctor is the lung expert, and if he has any concerns that it could be negatively affecting her breathing, we can stop it. Thank you

## 2021-10-19 NOTE — Progress Notes (Signed)
Teresa Jensen, female    DOB: 1945/11/05   MRN: [id number]   Brief patient profile:  65  yowf quit smoking  2011 @ wt 173  with cough / doe x decades which did improve  referred to pulmonary clinic in [facility]  07/27/2021 by NP Gentry Fitz for copd/abn cxr         PFT's 06/2010: FEV1 1.20 (56%), ratio 68, significant airtrapping, DLCO 46% but nearly corrects with Alv vol    History of Present Illness  07/27/2021  Pulmonary/ 1st office eval/ Torion Hulgan / [employer] / on plaquenil for "lipodermatosclerosis" per derm Dyspnea:  MMRC2 = can't walk a nl pace on a flat grade s sob but does fine slow and flat  Cough: better but several am's  week wakes up with clear mucus < 1 tsp  Sleep: bed flat lots of pillows under head 30 degrees due to gerd  SABA use: none  Bad noct gerd since taking fosamax x ? One year/ rx per pcp ? Pepcid did not help Rec Try off fosamax for the next 4 weeks  GERD  diet/ lifestyle Please remember to go to the lab department   ESR 65 .       09/07/2021  f/u ov/Caetano Oberhaus/ [facility] re: GOLD 2 copd with ? PF maint on no rx   Chief Complaint  Patient presents with   Follow-up  Dyspnea:  MMRC2 = can't walk a nl pace on a flat grade s sob but does fine slow and flat  Sob and tired about the same / [employer] does ok pushing cart  = MMRC2 = can't walk a nl pace on a flat grade s sob but does fine slow and flat   Cough: clear mucus / p supper  Sleeping: 30 degrees does fine  SABA use: none  02: none  Covid status:   vax x 3  / twice infected one year prior  Rec Talk to your dermatologist and Alma Friendly NP about your skin condition  Stiolto 2 puffs each am and you should notice better wind when you walk Work on inhaler technique:  To get the most out of exercise, you need to be continuously aware that you are short of breath, Make sure you check your oxygen saturations at highest level of activity  Pantoprazole (protonix) 40 mg   Take  30-60 min before first  meal of the day and Pepcid (famotidine)  20 mg after supper until return to office  Please schedule a follow up office visit in 6 weeks, call sooner if needed - bring inhaler  Late add: Needs walking sats next ov and consideration for HRCT    10/19/2021  f/u ov/Mishell Donalson/ [facility] re: GOLD 2    maint on none   Chief Complaint  Patient presents with   Follow-up  Dyspnea:  walks grocery store [employer] whole [employer] easier to do on stiolto tahn off  Cough: none  Sleeping: 30 degrees due to choking  SABA use: none  02: none  No change rash on legs  ?  3 years  (says no better on plaquenil   No obvious day to day or daytime variability or assoc excess/ purulent sputum or mucus plugs or hemoptysis or cp or chest tightness, subjective wheeze or overt sinus or hb symptoms.   Sleeping  without nocturnal  or early am exacerbation  of respiratory  c/o's or need for noct saba. Also denies any obvious fluctuation of symptoms  with weather or environmental changes or other aggravating or alleviating factors except as outlined above   No unusual exposure hx or h/o childhood pna/ asthma or knowledge of premature birth.  Current Allergies, Complete Past Medical History, Past Surgical History, Family History, and Social History were reviewed in [employer] record.  ROS  The following are not active complaints unless bolded Hoarseness, sore throat, dysphagia, dental problems, itching, sneezing,  nasal congestion or discharge of excess mucus or purulent secretions, ear ache,   fever, chills, sweats, unintended wt loss or wt gain, classically pleuritic or exertional cp,  orthopnea pnd or arm/hand swelling  or leg swelling, presyncope, palpitations, abdominal pain, anorexia, nausea, vomiting, diarrhea  or change in bowel habits or change in bladder habits, change in stools or change in urine, dysuria, hematuria,  rash, arthralgias, visual complaints, headache, numbness, weakness or ataxia  or problems with walking or coordination,  change in mood or  memory.        Current Meds  Medication Sig   amLODipine (NORVASC) 5 MG tablet TAKE 1 TABLET BY MOUTH EVERY DAY FOR BLOOD PRESSURE   Calcium Carb-Cholecalciferol (CALCIUM + VITAMIN D3 PO) Take 1,200 mg by mouth daily.   famotidine (PEPCID) 20 MG tablet One after supper   hydroxychloroquine (PLAQUENIL) 200 MG tablet Take 1 tablet (200 mg total) by mouth 2 (two) times daily.   levothyroxine (SYNTHROID) 75 MCG tablet TAKE 1 TABLET BY MOUTH EVERY MORNING ON AN EMPTY STOMACH WITH WATER ONLY. NO FOOD OR OTHER MEDICATIONS FOR 30 MINUTES.   losartan (COZAAR) 100 MG tablet TAKE 1 TABLET BY MOUTH EVERY DAY   Multiple Vitamins-Minerals (PRESERVISION AREDS 2 PO) Take 1 tablet by mouth in the morning and at bedtime.   pantoprazole (PROTONIX) 40 MG tablet Take 1 tablet (40 mg total) by mouth daily. Take 30-60 min before first meal of the day   tacrolimus (PROTOPIC) 0.1 % ointment Apply to aa's lower legs BID PRN.   tiZANidine (ZANAFLEX) 4 MG tablet Take 1 tablet (4 mg total) by mouth at bedtime as needed for muscle spasms.              Past Medical History:  Diagnosis Date   (HFpEF) heart failure with preserved ejection fraction (HCC)    Anxiety    Aortic atherosclerosis (HCC)    CAP (community acquired pneumonia)    COPD (chronic obstructive pulmonary disease) (HCC)    Dyspnea    DUE TO HEAT WITH COPD   Epistaxis 01/09/2017   GERD (gastroesophageal reflux disease)    OCC-NO MEDS   Grade I diastolic dysfunction    Hypertension    Hypothyroidism    Iliac artery stenosis, bilateral ([place])    New onset of headaches    Osteopenia    PMR (polymyalgia rheumatica) (HCC)    Pneumonia 02/13/2018   PONV (postoperative nausea and vomiting)    VERY NAUSEATED AFTER SURGERY   Shingles    Stasis dermatitis    Strain of chest wall 01/20/2011   Tobacco abuse         Objective:      10/19/2021    191    09/07/21 190 lb (86.2 kg)   08/04/21 187 lb (84.8 kg)  07/27/21 184 lb 12.8 oz (83.8 kg)    Vital signs reviewed  10/19/2021  - Note at rest 02 sats  RA% on 96   General appearance:    somber amb mod obese wf nad  HEENT : Oropharynx  clear   Nasal turbinates nl    NECK :  without  apparent JVD/ palpable Nodes/TM    LUNGS: no acc muscle use,  Mild barrel  contour chest wall with bilateral  Distant bs s audible wheeze and bilateral insp crackles in bases  without cough on insp or exp maneuvers  and mild  Hyperresonant  to  percussion bilaterally     CV:  RRR  no s3 or murmur or increase in P2, and no edema   ABD:  soft and nontender with pos end  insp Hoover's  in the supine position.  No bruits or organomegaly appreciated   MS:  Nl gait/ ext warm without deformities Or obvious joint restrictions  calf tenderness, cyanosis or clubbing     SKIN: warm and dry with  elastic hose in place, diffuse mild macular erythematous rash  NEURO:  alert, approp, nl sensorium with  no motor or cerebellar deficits apparent.                Lab Results  Component Value Date   ESRSEDRATE 65 (H) 07/27/2021   ESRSEDRATE 82 (H) 10/24/2018   ESRSEDRATE 66 (H) 12/06/2010       Assessment

## 2021-10-19 NOTE — Telephone Encounter (Signed)
Patient unable to afford Stiolto, $300, not covered by her insurance.  She received a sample from office and benefited from the inhaler.  pharmacy team, please advise if there is another inhaler that is covered at a lower cost for patient.  Thank you.

## 2021-10-19 NOTE — Addendum Note (Signed)
Addended by: Vanessa Barbara on: 10/19/2021 12:31 PM   Modules accepted: Orders

## 2021-10-19 NOTE — Telephone Encounter (Signed)
Called and spoke with patient, provided clarification regarding Plaquenil per Dr. Melvyn Novas.  She verbalized understanding.  Nothing further needed.

## 2021-10-19 NOTE — Telephone Encounter (Signed)
Dr. Melvyn Novas, The cost of the patient's inhalers seems to be related to her having not met her deductible.

## 2021-10-19 NOTE — Telephone Encounter (Signed)
aware

## 2021-10-19 NOTE — Assessment & Plan Note (Signed)
Quit smoking in 2012  - PFT's 06/2010:  FEV1 1.20 (56%), ratio 68, significant airtrapping, DLCO 46% but nearly corrects with Alv volume  - Labs ordered 07/27/2021  :  allergy screen Eos 0.1  IgE 3  alpha one AT phenotype  MM 168  - 07/27/2021   Walked on RA  x  2  lap(s) =  approx 350  ft  @ slow to mod pace, stopped due to "tired" at end  with lowest 02 sats 90%  - PFT's  08/30/21  FEV1 0.99 (68 % ) ratio 0.63  p 4 % improvement from saba p 0 prior to study with DLCO  7.69 (51%)  and FV curve concave  - 09/07/2021    try stiolto x 2 week sample  - 10/19/2021  After extensive coaching inhaler device,  effectiveness =    50% with hfa try bevespi  -  10/19/2021   Walked on RA  approx 350  ft  @ mod pace,   lowest 02 sats 88% but rebounded to low 90s s stopping   Pt is Group B in terms of symptom/risk and laba/lama therefore appropriate rx at this point >>>  bevespi  Take 2 puffs first thing in am and then another 2 puffs about 12 hours later.

## 2021-10-19 NOTE — Telephone Encounter (Signed)
Dr. Melvyn Novas,  Mrs. Jenson asked that I forward you this message for your review regarding her taking plaquenil. Thank you, Lurlean Horns., RMA

## 2021-10-19 NOTE — Telephone Encounter (Signed)
What I said was that collagen vasc dz for which plaquenil is intended, can sometimes affect the lung eg RA or lupus - which do not apply to her.  The plaquenil is fine to take and does not hurt the lungs

## 2021-10-25 ENCOUNTER — Telehealth: Payer: Self-pay | Admitting: Pharmacy Technician

## 2021-10-25 DIAGNOSIS — Z596 Low income: Secondary | ICD-10-CM

## 2021-10-25 NOTE — Progress Notes (Signed)
[facility])                                            [place]vid Team    10/25/2021  Virdia Ziesmer Cutshaw 05-Jul-1945 [id number]                                      Medication Assistance Referral  Referral From:  [facility] R[facility]  Asajah Damita Dunnings  Medication/Company: Charolotte Eke / [place] Patient application portion:  Mailed Provider application portion: Faxed  to Dr. Christinia Gully Provider address/fax verified via: Office website  Ilene Witcher P. Beecher Furio, [place]  [phone]

## 2021-10-30 ENCOUNTER — Ambulatory Visit
Admission: RE | Admit: 2021-10-30 | Discharge: 2021-10-30 | Disposition: A | Discharge status: Home / Self Care | Payer: Medicare Other | Source: Ambulatory Visit | Attending: Internal Medicine | Admitting: Internal Medicine

## 2021-10-30 DIAGNOSIS — J8410 Pulmonary fibrosis, unspecified: Secondary | ICD-10-CM | POA: Diagnosis present

## 2021-10-30 DIAGNOSIS — J449 Chronic obstructive pulmonary disease, unspecified: Secondary | ICD-10-CM | POA: Insufficient documentation

## 2021-10-31 ENCOUNTER — Other Ambulatory Visit: Payer: Self-pay | Admitting: Internal Medicine

## 2021-10-31 ENCOUNTER — Encounter: Payer: Self-pay | Admitting: Internal Medicine

## 2021-10-31 ENCOUNTER — Telehealth: Payer: Self-pay | Admitting: Internal Medicine

## 2021-10-31 DIAGNOSIS — R911 Solitary pulmonary nodule: Secondary | ICD-10-CM

## 2021-10-31 NOTE — Progress Notes (Signed)
Spoke with pt and notified of results per Dr. Melvyn Novas. Pt verbalized understanding and denied any questions. Pt agreeable to PET and I have ordered this.

## 2021-10-31 NOTE — Telephone Encounter (Signed)
Aware, see result note 

## 2021-10-31 NOTE — Telephone Encounter (Signed)
Please advise on the following call report:     CLINICAL DATA: Abnormal chest radiograph. Worsening fatigue and cough. Remote smoking history.  * Tracking Code: BO *  EXAM: CT CHEST WITHOUT CONTRAST  TECHNIQUE: Multidetector CT imaging of the chest was performed following the standard protocol without intravenous contrast. High resolution imaging of the lungs, as well as inspiratory and expiratory imaging, was performed.  RADIATION DOSE REDUCTION: This exam was performed according to the departmental dose-optimization program which includes automated exposure control, adjustment of the mA and/or kV according to patient size and/or use of iterative reconstruction technique.  COMPARISON: 05/05/2021 chest radiograph.  FINDINGS: Cardiovascular: Normal heart size. No significant pericardial effusion/thickening. Left anterior descending and right coronary atherosclerosis. Atherosclerotic nonaneurysmal thoracic aorta. Normal caliber pulmonary arteries.  Mediastinum/Nodes: No significant thyroid nodules. Unremarkable esophagus. No pathologically enlarged axillary, mediastinal or hilar lymph nodes, noting limited sensitivity for the detection of hilar adenopathy on this noncontrast study.  Lungs/Pleura: No pneumothorax. Moderate centrilobular emphysema. Mild smooth bilateral posterior lower pleural thickening. No pleural effusions. No calcified pleural plaques. Slightly spiculated solid 1.5 x 1.1 cm medial left upper lobe pulmonary nodule (series 8/image 53). Subpleural medial left lower lobe solid 0.3 cm pulmonary nodule (series 8/image 96). No additional significant pulmonary nodules. No acute consolidative airspace disease. Moderate patchy air trapping in both lungs on the expiration sequence. Scattered thin parenchymal bands and mild patchy subpleural reticulation throughout both lungs without significant regions of traction bronchiectasis, architectural distortion or frank  honeycombing.  Upper abdomen: Left adrenal 1.9 cm nodule with density 6 HU, compatible with an adenoma.  Musculoskeletal: No aggressive appearing focal osseous lesions. Moderate thoracic spondylosis. Partially visualized surgical hardware from ACDF.  IMPRESSION: 1. Slightly spiculated solid 1.5 cm medial left upper lobe pulmonary nodule, suspicious for primary bronchogenic carcinoma. Recommend further characterization with PET-CT and multidisciplinary thoracic oncology consultation. 2. No thoracic adenopathy. 3. Moderate patchy air trapping in both lungs, indicative of small airways disease. 4. Scattered thin parenchymal bands and mild patchy subpleural reticulation throughout both lungs without bronchiectasis or honeycombing. Findings are most compatible with nonspecific mild bland postinfectious/postinflammatory scarring. No compelling findings of interstitial lung disease. 5. Mild smooth bilateral pleural thickening without pleural effusions. 6. Two-vessel coronary atherosclerosis. 7. Left adrenal adenoma, for which no follow-up imaging is recommended. 8. Aortic Atherosclerosis (ICD10-I70.0) and Emphysema (ICD10-J43.9).  These results will be called to the ordering clinician or representative by the Radiologist Assistant, and communication documented in the PACS or [employer].

## 2021-10-31 NOTE — Telephone Encounter (Signed)
Error

## 2021-11-01 ENCOUNTER — Encounter: Payer: Self-pay | Admitting: Medical

## 2021-11-01 ENCOUNTER — Ambulatory Visit: Payer: Medicare Other | Attending: Medical | Admitting: Medical

## 2021-11-01 VITALS — BP 142/73 | HR 85 | Ht <= 58 in | Wt 190.8 lb

## 2021-11-01 DIAGNOSIS — I10 Essential (primary) hypertension: Secondary | ICD-10-CM

## 2021-11-01 DIAGNOSIS — J449 Chronic obstructive pulmonary disease, unspecified: Secondary | ICD-10-CM | POA: Diagnosis not present

## 2021-11-01 DIAGNOSIS — I5032 Chronic diastolic (congestive) heart failure: Secondary | ICD-10-CM | POA: Diagnosis not present

## 2021-11-01 NOTE — Progress Notes (Signed)
Cardiology Office Note:    Date:  11/01/2021   ID:  Teresa Jensen, DOB Aug 31, 1945, MRN [id number]  PCP:  Pleas Koch, NP  [facility] Cardiologist:  Nelva Bush, MD  [facility] Electrophysiologist:  None   Referring MD: Pleas Koch, NP   Chief Complaint: 12 month follow-up  History of Present Illness:    Teresa Jensen is a 76 y.o. female with a hx of HFpEF, HTN, COPD, polymyalgia rheumatica, GERD who presents for follow-up.    Initially seen 11/2018 by Dr. Saunders Revel following hospitalization for shortness of breath and weakness.  Chest x-ray suggestive of pulmonary edema.  Echocardiogram showed preserved LVEF with grade 1 diastolic dysfunction.  She did subsequent myocardial perfusion stress test which was low risk without evidence of ischemia.   At the clinic visit 05/22/2019 she reported stable exertional dyspnea.  Her losartan was increased to 100 mg daily due to suboptimal control of her blood pressure.    Seen in follow-up October 2021.  Her blood pressure was noted to be elevated though she was hesitant regarding medication changes. Through follow up MyChart message, Amlodipine 5mg  QD was started though subsequently discontinued due to lower extremity edema.   seen 08/04/20 and was feeling fairly well. Maxide was added.   ER visit early October 2022 for weakness. Work-up showed UTI and she was discharged.   Last seen 10/21/20 and Maxide was stopped due to worsening renal function, and she was started on hydrochlorothiazide.  Today, patient reports she is overall doing well from a cardiac standpoint.  She has been following with pulmonology for her COPD.  They try to start her on inhalers, however she reports they are too expensive.  PCP stopped hydrochlorothiazide for worsening kidney function.  He was started on amlodipine 5 mg daily.  Blood pressure today mildly elevated, however it has been low in the past.  Patient denies chest pain, significant shortness  of breath, lower leg edema, orthopnea, PND, palpitations.  Past Medical History:  Diagnosis Date   (HFpEF) heart failure with preserved ejection fraction (HCC)    Anxiety    Aortic atherosclerosis (HCC)    CAP (community acquired pneumonia)    COPD (chronic obstructive pulmonary disease) ([place])    Dyspnea    DUE TO HEAT WITH COPD   Epistaxis 01/09/2017   GERD (gastroesophageal reflux disease)    OCC-NO MEDS   Grade I diastolic dysfunction    Hypertension    Hypothyroidism    Iliac artery stenosis, bilateral ([place])    New onset of headaches    Osteopenia    PMR (polymyalgia rheumatica) (HCC)    Pneumonia 02/13/2018   PONV (postoperative nausea and vomiting)    VERY NAUSEATED AFTER SURGERY   Shingles    Stasis dermatitis    Strain of chest wall 01/20/2011   Tobacco abuse     Past Surgical History:  Procedure Laterality Date   ABDOMINAL HYSTERECTOMY     APPENDECTOMY     BACK SURGERY     LUMBAR FUSION   CARPAL TUNNEL RELEASE Left    CARPAL TUNNEL RELEASE Left 08/31/2019   Procedure: CARPAL TUNNEL RELEASE;  Surgeon: Dereck Leep, MD;  Location: [facility] ORS;  Service: Orthopedics;  Laterality: Left;   CARPAL TUNNEL RELEASE Right 06/22/2020   Procedure: CARPAL TUNNEL RELEASE;  Surgeon: Dereck Leep, MD;  Location: [facility] ORS;  Service: Orthopedics;  Laterality: Right;   CERVICAL FUSION     CHOLECYSTECTOMY     HEMORRHOID  SURGERY     LUMBAR FUSION     TUBAL LIGATION      Current Medications: Current Meds  Medication Sig   amLODipine (NORVASC) 5 MG tablet TAKE 1 TABLET BY MOUTH EVERY DAY FOR BLOOD PRESSURE   Budeson-Glycopyrrol-Formoterol (BREZTRI AEROSPHERE) 160-9-4.8 MCG/ACT AERO Inhale 2 puffs into the lungs in the morning and at bedtime.   Calcium Carb-Cholecalciferol (CALCIUM + VITAMIN D3 PO) Take 1,200 mg by mouth daily.   famotidine (PEPCID) 20 MG tablet One after supper   Glycopyrrolate-Formoterol (BEVESPI AEROSPHERE) 9-4.8 MCG/ACT AERO Inhale 2 puffs into the lungs  2 (two) times daily.   hydroxychloroquine (PLAQUENIL) 200 MG tablet Take 1 tablet (200 mg total) by mouth 2 (two) times daily.   levothyroxine (SYNTHROID) 75 MCG tablet TAKE 1 TABLET BY MOUTH EVERY MORNING ON AN EMPTY STOMACH WITH WATER ONLY. NO FOOD OR OTHER MEDICATIONS FOR 30 MINUTES.   losartan (COZAAR) 100 MG tablet TAKE 1 TABLET BY MOUTH EVERY DAY   Multiple Vitamins-Minerals (PRESERVISION AREDS 2 PO) Take 1 tablet by mouth in the morning and at bedtime.   pantoprazole (PROTONIX) 40 MG tablet Take 1 tablet (40 mg total) by mouth daily. Take 30-60 min before first meal of the day   tacrolimus (PROTOPIC) 0.1 % ointment Apply to aa's lower legs BID PRN.   tiZANidine (ZANAFLEX) 4 MG tablet Take 1 tablet (4 mg total) by mouth at bedtime as needed for muscle spasms.     Allergies:   Epinephrine, Food, Mangifera indica, Other, Prednisone, Milk-related compounds, Pneumococcal 13-val conj vacc, and Prevnar 13 [pneumococcal 13-val conj vacc]   Social History   Socioeconomic History   Marital status: Married    Spouse name: [name]   Number of children: 1   Years of education: Not on file   Highest education level: Not on file  Occupational History   Occupation: Therapist, art as a Therapist, art: RETIRED    Comment: retired  Tobacco Use   Smoking status: Former    Packs/day: 1.50    Years: 20.00    Total pack years: 30.00    Types: Cigarettes    Quit date: 02/11/2009    Years since quitting: 12.7   Smokeless tobacco: Never  Vaping Use   Vaping Use: Never used  Substance and Sexual Activity   Alcohol use: No    Alcohol/week: 0.0 standard drinks of alcohol   Drug use: No   Sexual activity: Not Currently  Other Topics Concern   Not on file  Social History Narrative   Lives in [place] with her husband.  Takes care of her two grandchildren, ages 45 and 2, everyday in [place].  Has one son who is 81.   Desires CPR   Would not want prolonged life support if futile    Social Determinants of Health   Financial Resource Strain: Low Risk  (05/01/2021)   Overall Financial Resource Strain (CARDIA)    Difficulty of Paying Living Expenses: Not hard at all  Food Insecurity: No Food Insecurity (05/01/2021)   Hunger Vital Sign    Worried About Running Out of Food in the Last Year: Never true    Ran Out of Food in the Last Year: Never true  Transportation Needs: No Transportation Needs (05/01/2021)   PRAPARE - Hydrologist (Medical): No    Lack of Transportation (Non-Medical): No  Physical Activity: Sufficiently Active (05/01/2021)   Exercise Vital Sign    Days of Exercise  per Week: 5 days    Minutes of Exercise per Session: 30 min  Stress: No Stress Concern Present (05/01/2021)   [place]    Feeling of Stress : Not at all  Social Connections: [place] (05/01/2021)   Social Connection and Isolation Panel [NHANES]    Frequency of Communication with Friends and Family: More than three times a week    Frequency of Social Gatherings with Friends and Family: More than three times a week    Attends Religious Services: More than 4 times per year    Active Member of [employer] or Organizations: Yes    Attends Music therapist: More than 4 times per year    Marital Status: Married     Family History: The patient's family history includes Breast cancer in her paternal grandmother; Cancer in her father; Cirrhosis in her brother; Diabetes in her brother and sister; Heart attack (age of onset: 88) in her mother; Heart disease in her brother and sister; Hypertension in her sister; Prostate cancer in her father; Tuberculosis in her sister.  ROS:   Please see the history of present illness.     All other systems reviewed and are negative.  EKGs/Labs/Other Studies Reviewed:    The following studies were reviewed today:  Echo 02/2018  1. The left ventricle has  normal systolic function with an ejection  fraction of 60-65%. The cavity size was normal. Left ventricular diastolic  Doppler parameters are consistent with impaired relaxation.   2. The right ventricle has normal systolic function. The cavity was  normal. There is no increase in right ventricular wall thickness.   3. Right atrial pressure is estimated at 10 mmHg.   4. Unable to estimate RVSP   Myoview Lexiscan 11/21/2018 Narrative & Impression  Low risk, probably normal pharmacologic myocardial perfusion stress test. There is a small in size, mild in severity, fixed defect involving the apical anterior and apical segments. This is most consistent with artifact (attenuation and apical thinning) but cannot rule out subtle scar. There is no significant ischemia. The left ventricular ejection fraction is normal (56%). There is no significant coronary artery calcification. Aortic atherosclerosis is noted on the attenuation correction CT. This is a low risk study.   EKG:  EKG is ordered today.  The ekg ordered today demonstrates normal sinus rhythm, 85 bpm, nonspecific ST changes  Recent Labs: 02/28/2021: ALT 17; TSH 4.21 04/06/2021: BUN 15; Creatinine, Ser 0.92; Potassium 4.4; Sodium 139 07/27/2021: Hemoglobin 12.3; Platelets 315  Recent Lipid Panel    Component Value Date/Time   CHOL 141 02/28/2021 1051   TRIG 114.0 02/28/2021 1051   HDL 52.40 02/28/2021 1051   CHOLHDL 3 02/28/2021 1051   VLDL 22.8 02/28/2021 1051   LDLCALC 66 02/28/2021 1051    Physical Exam:    VS:  BP (!) 142/73 (BP Location: Left Arm, Patient Position: Sitting, Cuff Size: Large)   Pulse 85   Ht 4\' 9"  (1.448 m)   Wt 190 lb 12.8 oz (86.5 kg)   SpO2 94%   BMI 41.29 kg/m     Wt Readings from Last 3 Encounters:  11/01/21 190 lb 12.8 oz (86.5 kg)  10/19/21 191 lb 9.6 oz (86.9 kg)  10/12/21 192 lb (87.1 kg)     GEN: Well nourished, well developed in no acute distress HEENT: Normal NECK: No JVD; No carotid  bruits LYMPHATICS: No lymphadenopathy CARDIAC: RRR, no murmurs, rubs, gallops RESPIRATORY: Minimal wheezing  on exam ABDOMEN: Soft, non-tender, non-distended MUSCULOSKELETAL:  No edema; No deformity  SKIN: Warm and dry NEUROLOGIC:  Alert and oriented x 3 PSYCHIATRIC:  Normal affect   ASSESSMENT:    1. Essential hypertension   2. Chronic diastolic heart failure ([place])   3. Chronic obstructive pulmonary disease, unspecified COPD type ([place])    PLAN:    In order of problems listed above:  Hypertension PCP subsequently stopped hydrochlorothiazide due to worsening kidney function, and started her on amlodipine.  Blood pressure today is mildly elevated, however has been low in the past and chart review.  Continue losartan 100 mg daily and amlodipine 5 mg daily.  HFpEF Patient is euvolemic on exam.  Echo from 2020 showed LVEF 60 to 65%, impaired relaxation.  Continue losartan.  Recommend low-salt diet, leg elevation, compression socks.  COPD Patient is following with pulmonology for this.  Medication management affected by financial burden.  Disposition: Follow up in 1 year(s) with MD/APP    Signed, Raegan Winders Ninfa Meeker, PA-C  11/01/2021 8:55 AM    [facility]

## 2021-11-01 NOTE — Patient Instructions (Addendum)
Medication Instructions:  - Your physician recommends that you continue on your current medications as directed. Please refer to the Current Medication list given to you today.  *If you need a refill on your cardiac medications before your next appointment, please call your pharmacy*   Lab Work: - none ordered  If you have labs (blood work) drawn today and your tests are completely normal, you will receive your results only by: [place] (if you have MyChart) OR A paper copy in the mail If you have any lab test that is abnormal or we need to change your treatment, we will call you to review the results.   Testing/Procedures: - none ordered   Follow-Up: At [facility], you and your health needs are our priority.  As part of our continuing mission to provide you with exceptional heart care, we have created designated Provider Care Teams.  These Care Teams include your primary Cardiologist (physician) and Advanced Practice Providers (APPs -  Physician Assistants and Nurse Practitioners) who all work together to provide you with the care you need, when you need it.  We recommend signing up for the patient portal called "MyChart".  Sign up information is provided on this After Visit Summary.  MyChart is used to connect with patients for Virtual Visits (Telemedicine).  Patients are able to view lab/test results, encounter notes, upcoming appointments, etc.  Non-urgent messages can be sent to your provider as well.   To learn more about what you can do with MyChart, go to [email or web address].    Your next appointment:   1 year(s)  The format for your next appointment:   In Person  Provider:   You may see Nelva Bush, MD or one of the following Advanced Practice Providers on your designated Care Team:    Cadence Kathlen Mody, [place]     Other Instructions N/a  Important Information About [place]

## 2021-11-08 ENCOUNTER — Telehealth: Payer: Self-pay | Admitting: Pharmacy Technician

## 2021-11-08 ENCOUNTER — Encounter: Payer: Self-pay | Admitting: Internal Medicine

## 2021-11-08 DIAGNOSIS — Z596 Low income: Secondary | ICD-10-CM

## 2021-11-08 NOTE — Progress Notes (Signed)
[facility])                                            [place] Team    11/08/2021  Teresa Jensen 1945/04/25 [id number]  Received both patient and provider portion(s) of patient assistance application(s) for [employer]. Faxed completed application and required documents into [employer].    [name] P. Jeanie Mccard, [place]  [phone]

## 2021-11-08 NOTE — Telephone Encounter (Signed)
The pet scan looks like a very large donut, not a tunnel, so should be good to go but if pt wants to take something she can use Vistaril 25 mg one hour prior and repeat x one  30 min prior if needed but must have someone drive to and from the study

## 2021-11-13 ENCOUNTER — Telehealth: Payer: Self-pay

## 2021-11-13 ENCOUNTER — Telehealth: Payer: Self-pay | Admitting: Pharmacy Technician

## 2021-11-13 ENCOUNTER — Ambulatory Visit: Payer: Medicare Other

## 2021-11-13 DIAGNOSIS — Z596 Low income: Secondary | ICD-10-CM

## 2021-11-13 MED ORDER — HYDROXYZINE HCL 25 MG PO TABS: ORAL_TABLET | ORAL | 0 refills | Status: DC

## 2021-11-13 NOTE — Addendum Note (Signed)
Addended by: Valerie Salts on: 11/13/2021 09:51 AM   Modules accepted: Orders

## 2021-11-13 NOTE — Progress Notes (Signed)
[facility])                                            [place] Team    11/13/2021  Teresa Jensen Apr 05, 1945 [id number]  Care coordination call placed to [employer] in regard to [facility] application.  Spoke to [place] who informs patient is APPROVED 11/10/2021-01/01/2023. She informs medication will be delivered to the home address on file and patient is set up for automatic refills.  Teresa Jensen, [place]  [phone]

## 2021-11-13 NOTE — Telephone Encounter (Signed)
Received a fax from  [place] regarding an approval for  [place]  patient assistance from 11/09/2021 to 01/01/2023. Approval letter has been sent to scan center and may be referenced under the Media tab.  [employer] Phone number: [phone]

## 2021-11-13 NOTE — Telephone Encounter (Signed)
Hi [place],   I will forward this message to her.   Thanks for the update.   Kristeen Miss, PharmD Clinical Pharmacist [place] Cell: [phone]

## 2021-11-21 ENCOUNTER — Ambulatory Visit
Admission: RE | Admit: 2021-11-21 | Discharge: 2021-11-21 | Disposition: A | Discharge status: Home / Self Care | Payer: Medicare Other | Source: Ambulatory Visit | Attending: Internal Medicine | Admitting: Internal Medicine

## 2021-11-21 DIAGNOSIS — R918 Other nonspecific abnormal finding of lung field: Secondary | ICD-10-CM | POA: Diagnosis not present

## 2021-11-21 DIAGNOSIS — K5730 Diverticulosis of large intestine without perforation or abscess without bleeding: Secondary | ICD-10-CM | POA: Diagnosis not present

## 2021-11-21 DIAGNOSIS — I700 Atherosclerosis of aorta: Secondary | ICD-10-CM | POA: Diagnosis not present

## 2021-11-21 DIAGNOSIS — R911 Solitary pulmonary nodule: Secondary | ICD-10-CM | POA: Insufficient documentation

## 2021-11-21 LAB — GLUCOSE, CAPILLARY: Glucose-Capillary: 93 mg/dL (ref 70–99)

## 2021-11-21 MED ORDER — FLUDEOXYGLUCOSE F - 18 (FDG) INJECTION
9.8000 | Freq: Once | INTRAVENOUS | Status: AC | PRN
  Administered 2021-11-21: 10.32 via INTRAVENOUS

## 2021-11-24 ENCOUNTER — Other Ambulatory Visit: Payer: Self-pay | Admitting: Dermatology

## 2021-11-24 DIAGNOSIS — M793 Panniculitis, unspecified: Secondary | ICD-10-CM

## 2021-11-25 ENCOUNTER — Other Ambulatory Visit: Payer: Self-pay | Admitting: Internal Medicine

## 2021-11-27 ENCOUNTER — Other Ambulatory Visit: Payer: Self-pay

## 2021-11-27 ENCOUNTER — Encounter: Payer: Self-pay | Admitting: Internal Medicine

## 2021-11-27 DIAGNOSIS — R911 Solitary pulmonary nodule: Secondary | ICD-10-CM | POA: Insufficient documentation

## 2021-11-30 ENCOUNTER — Encounter: Payer: Self-pay | Admitting: Dermatology

## 2021-11-30 ENCOUNTER — Ambulatory Visit (INDEPENDENT_AMBULATORY_CARE_PROVIDER_SITE_OTHER): Payer: Medicare Other | Admitting: Dermatology

## 2021-11-30 VITALS — BP 124/62 | HR 88

## 2021-11-30 DIAGNOSIS — L814 Other melanin hyperpigmentation: Secondary | ICD-10-CM

## 2021-11-30 DIAGNOSIS — Z79899 Other long term (current) drug therapy: Secondary | ICD-10-CM

## 2021-11-30 DIAGNOSIS — Z1283 Encounter for screening for malignant neoplasm of skin: Secondary | ICD-10-CM | POA: Diagnosis not present

## 2021-11-30 DIAGNOSIS — L578 Other skin changes due to chronic exposure to nonionizing radiation: Secondary | ICD-10-CM

## 2021-11-30 DIAGNOSIS — M793 Panniculitis, unspecified: Secondary | ICD-10-CM | POA: Diagnosis not present

## 2021-11-30 DIAGNOSIS — D229 Melanocytic nevi, unspecified: Secondary | ICD-10-CM

## 2021-11-30 DIAGNOSIS — L821 Other seborrheic keratosis: Secondary | ICD-10-CM

## 2021-11-30 NOTE — Patient Instructions (Addendum)
Opzelura cream twice daily to legs. Call if would like prescription.   Continue Tacrolimus ointment as directed.  Continue compression stockings. Continue Hydroxychloroquine 200mg  twice daily.   Continue yearly eye exams.  Hydroxychloroquine (plaquenil) can rarely cause irreversible damage to the retina of the eye if used for a long period of time. This damage can be identified and the medication stopped before vision changes are noticed by going for a yearly ophthalmology exam. Rarely, this medicine can cause low blood counts, liver inflammation, and/or a color change of the skin.  The use of Plaquenil requires long term medication management, including periodic office visits and monitoring of blood work.    Recommend daily broad spectrum sunscreen SPF 30+ to sun-exposed areas, reapply every 2 hours as needed. Call for new or changing lesions.  Staying in the shade or wearing long sleeves, sun glasses (UVA+UVB protection) and wide brim hats (4-inch brim around the entire circumference of the hat) are also recommended for sun protection.    Melanoma ABCDEs  Melanoma is the most dangerous type of skin cancer, and is the leading cause of death from skin disease.  You are more likely to develop melanoma if you: Have light-colored skin, light-colored eyes, or red or blond hair Spend a lot of time in the sun Tan regularly, either outdoors or in a tanning bed Have had blistering sunburns, especially during childhood Have a close family member who has had a melanoma Have atypical moles or large birthmarks  Early detection of melanoma is key since treatment is typically straightforward and cure rates are extremely high if we catch it early.   The first sign of melanoma is often a change in a mole or a new dark spot.  The ABCDE system is a way of remembering the signs of melanoma.  A for asymmetry:  The two halves do not match. B for border:  The edges of the growth are irregular. C for color:   A mixture of colors are present instead of an even brown color. D for diameter:  Melanomas are usually (but not always) greater than 109mm - the size of a pencil eraser. E for evolution:  The spot keeps changing in size, shape, and color.  Please check your skin once per month between visits. You can use a small mirror in front and a large mirror behind you to keep an eye on the back side or your body.   If you see any new or changing lesions before your next follow-up, please call to schedule a visit.  Please continue daily skin protection including broad spectrum sunscreen SPF 30+ to sun-exposed areas, reapplying every 2 hours as needed when you're outdoors.   Staying in the shade or wearing long sleeves, sun glasses (UVA+UVB protection) and wide brim hats (4-inch brim around the entire circumference of the hat) are also recommended for sun protection.    Due to recent changes in healthcare laws, you may see results of your pathology and/or laboratory studies on MyChart before the doctors have had a chance to review them. We understand that in some cases there may be results that are confusing or concerning to you. Please understand that not all results are received at the same time and often the doctors may need to interpret multiple results in order to provide you with the best plan of care or course of treatment. Therefore, we ask that you please give [place] 2 business days to thoroughly review all your results before contacting the office  for clarification. Should we see a critical lab result, you will be contacted sooner.   If You Need Anything After Your Visit  If you have any questions or concerns for your doctor, please call our main line at [phone] and press option 4 to reach your doctor's medical assistant. If no one answers, please leave a voicemail as directed and we will return your call as soon as possible. Messages left after 4 pm will be answered the following business day.   You  may also send [place] a message via [place]. We typically respond to MyChart messages within 1-2 business days.  For prescription refills, please ask your pharmacy to contact our office. Our fax number is [phone].  If you have an urgent issue when the clinic is closed that cannot wait until the next business day, you can [place] your doctor at the number below.    Please note that while we do our best to be available for urgent issues outside of office hours, we are not available 24/7.   If you have an urgent issue and are unable to reach [place], you may choose to seek medical care at your doctor's office, retail clinic, urgent care center, or emergency room.  If you have a medical emergency, please immediately call 911 or go to the emergency department.  Pager Numbers  - Dr. Nehemiah Massed: [phone]  - Dr. Laurence Ferrari: [phone]  - Dr. Nicole Kindred: [phone]  In the event of inclement weather, please call our main line at [phone] for an update on the status of any delays or closures.  Dermatology Medication Tips: Please keep the boxes that topical medications come in in order to help keep track of the instructions about where and how to use these. Pharmacies typically print the medication instructions only on the boxes and not directly on the medication tubes.   If your medication is too expensive, please contact our office at [phone] option 4 or send [place] a message through [place].   We are unable to tell what your co-pay for medications will be in advance as this is different depending on your insurance coverage. However, we may be able to find a substitute medication at lower cost or fill out paperwork to get insurance to cover a needed medication.   If a prior authorization is required to get your medication covered by your insurance company, please allow [place] 1-2 business days to complete this process.  Drug prices often vary depending on where the prescription is filled and some  pharmacies may offer cheaper prices.  The website www.goodrx.com contains coupons for medications through different pharmacies. The prices here do not account for what the cost may be with help from insurance (it may be cheaper with your insurance), but the website can give you the price if you did not use any insurance.  - You can print the associated coupon and take it with your prescription to the pharmacy.  - You may also stop by our office during regular business hours and pick up a GoodRx coupon card.  - If you need your prescription sent electronically to a different pharmacy, notify our office through [facility] or by phone at [phone] option 4.     Si Usted Necesita Algo Despus de Su Visita  Tambin puede enviarnos un mensaje a travs de Pharmacist, community. Por lo general respondemos a los mensajes de MyChart en el transcurso de 1 a 2 das hbiles.  Para renovar recetas, por favor pida a su farmacia que  se ponga en contacto con nuestra oficina. Harland Dingwall de fax es [place] [phone].  Si tiene un asunto urgente cuando la clnica est cerrada y que no puede esperar hasta el siguiente da hbil, puede llamar/localizar a su doctor(a) al nmero que aparece a continuacin.   Por favor, tenga en cuenta que aunque hacemos todo lo posible para estar disponibles para asuntos urgentes fuera del horario de [place], no estamos disponibles las 24 horas del da, los 7 das de la [place].   Si tiene un problema urgente y no puede comunicarse con nosotros, puede optar por buscar atencin mdica  en el consultorio de su doctor(a), en una clnica privada, en un centro de atencin urgente o en una sala de emergencias.  Si tiene Engineering geologist, por favor llame inmediatamente al 911 o vaya a la sala de emergencias.  Nmeros de bper  - Dr. Nehemiah Massed: [phone]  - Dra. Moye: [phone]  - Dra. Nicole Kindred: [phone]  En caso de inclemencias del [place], por favor llame a Johnsie Kindred  principal al [phone] para una actualizacin sobre el [place] de cualquier retraso o cierre.  Consejos para la medicacin en dermatologa: Por favor, guarde las cajas en las que vienen los medicamentos de uso tpico para ayudarle a seguir las instrucciones sobre dnde y cmo usarlos. Las farmacias generalmente imprimen las instrucciones del medicamento slo en las cajas y no directamente en los tubos del [place].   Si su medicamento es muy caro, por favor, pngase en contacto con Zigmund Daniel llamando al [phone] y presione la opcin 4 o envenos un mensaje a travs de Pharmacist, community.   No podemos decirle cul ser su copago por los medicamentos por adelantado ya que esto es diferente dependiendo de la cobertura de su seguro. Sin embargo, es posible que podamos encontrar un medicamento sustituto a Electrical engineer un formulario para que el seguro cubra el medicamento que se considera necesario.   Si se requiere una autorizacin previa para que su compaa de seguros [place] su medicamento, por favor permtanos de 1 a 2 das hbiles para completar este proceso.  Los precios de los medicamentos varan con frecuencia dependiendo del Environmental consultant de dnde se surte la receta y alguna farmacias pueden ofrecer precios ms baratos.  El sitio web www.goodrx.com tiene cupones para medicamentos de Airline pilot. Los precios aqu no tienen en cuenta lo que podra costar con la ayuda del seguro (puede ser ms barato con su seguro), pero el sitio web puede darle el precio si no utiliz Research scientist (physical sciences).  - Puede imprimir el cupn correspondiente y llevarlo con su receta a la farmacia.  - Tambin puede pasar por nuestra oficina durante el horario de atencin regular y Charity fundraiser una tarjeta de cupones de GoodRx.  - Si necesita que su receta se enve electrnicamente a una farmacia diferente, informe a nuestra oficina a travs de MyChart de [place] o por telfono llamando al [phone] y presione la opcin  4.

## 2021-11-30 NOTE — Progress Notes (Signed)
Follow-Up Visit   Subjective  Teresa Jensen is a 76 y.o. female who presents for the following: Annual Exam (No personal Hx of skin cancer or dysplastic nevi) and Follow-up (B/L lower legs. Lipodermatosclerosis with stasis dermatitis. Taking Hydroxychloroquine, using compression stockings and Tacrolimus ointment as directed ).  The patient presents for Total-Body Skin Exam (TBSE) for skin cancer screening and mole check.  The patient has spots, moles and lesions to be evaluated, some may be new or changing and the patient has concerns that these could be cancer.   The following portions of the chart were reviewed this encounter and updated as appropriate:  Tobacco  Allergies  Meds  Problems  Med Hx  Surg Hx  Fam Hx      Review of Systems: No other skin or systemic complaints except as noted in HPI or Assessment and Plan.   Objective  Well appearing patient in no apparent distress; mood and affect are within normal limits.  A full examination was performed including scalp, head, eyes, ears, nose, lips, neck, chest, axillae, abdomen, back, buttocks, bilateral upper extremities, bilateral lower extremities, hands, feet, fingers, toes, fingernails, and toenails. All findings within normal limits unless otherwise noted below.  B/L legs Mild erythema at the bilateral pretibial, ankle with mild induration, indurated light violaceous plaque at the left medial ankle, Pitting edema trace to 1+    Assessment & Plan   Lentigines - Scattered tan macules - Due to sun exposure - Benign-appearing, observe - Recommend daily broad spectrum sunscreen SPF 30+ to sun-exposed areas, reapply every 2 hours as needed. - Call for any changes  Seborrheic Keratoses - Stuck-on, waxy, tan-brown papules and/or plaques  - Benign-appearing - Discussed benign etiology and prognosis. - Observe - Call for any changes  Melanocytic Nevi - Tan-brown and/or pink-flesh-colored symmetric macules and  papules - Benign appearing on exam today - Observation - Call clinic for new or changing moles - Recommend daily use of broad spectrum spf 30+ sunscreen to sun-exposed areas.   Hemangiomas - Red papules - Discussed benign nature - Observe - Call for any changes  Actinic Damage - Chronic condition, secondary to cumulative UV/sun exposure - diffuse scaly erythematous macules with underlying dyspigmentation - Recommend daily broad spectrum sunscreen SPF 30+ to sun-exposed areas, reapply every 2 hours as needed.  - Staying in the shade or wearing long sleeves, sun glasses (UVA+UVB protection) and wide brim hats (4-inch brim around the entire circumference of the hat) are also recommended for sun protection.  - Call for new or changing lesions.  Skin cancer screening performed today.  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 Currently moderately well-controlled.   Continue Tacrolimus ointment as directed.  Continue compression stockings. Continue Hydroxychloroquine 200mg  twice daily.   Continue yearly eye exams.  Hydroxychloroquine (plaquenil) can rarely cause irreversible damage to the retina of the eye if used for a long period of time. This damage can be identified and the medication stopped before vision changes are noticed by going for a yearly ophthalmology exam. Rarely, this medicine can cause low blood counts, liver inflammation, and/or a color change of the skin.  The use of Plaquenil requires long term medication management, including periodic office visits and monitoring of blood work.  Opzelura cream samples given to apply twice daily to legs. If this helps, she should call and we will try to get it covered. Advised to apply to less than  10% of skin.   Patient will discuss Plaquenil with surgeon at her next appointment. She states she will advise him to contact Dr. Laurence Ferrari with any questions.    Related Procedures Comprehensive metabolic panel  Related Medications hydroxychloroquine (PLAQUENIL) 200 MG tablet TAKE 1 TABLET BY MOUTH TWICE A DAY  Encounter for long-term (current) use of high-risk medication  Related Procedures Comprehensive metabolic panel   Return in about 1 year (around 12/01/2022) for TBSE, Lipodermatosclerosis in 6 months.  I, Emelia Salisbury, CMA, am acting as scribe for Forest Gleason, MD.  Documentation: I have reviewed the above documentation for accuracy and completeness, and I agree with the above.  Forest Gleason, MD

## 2021-12-01 LAB — COMPREHENSIVE METABOLIC PANEL
ALT: 8 IU/L (ref 0–32)
AST: 16 IU/L (ref 0–40)
Albumin/Globulin Ratio: 1.3 (ref 1.2–2.2)
Albumin: 4.2 g/dL (ref 3.8–4.8)
Alkaline Phosphatase: 82 IU/L (ref 44–121)
BUN/Creatinine Ratio: 17 (ref 12–28)
BUN: 21 mg/dL (ref 8–27)
Bilirubin Total: 0.3 mg/dL (ref 0.0–1.2)
CO2: 25 mmol/L (ref 20–29)
Calcium: 9.2 mg/dL (ref 8.7–10.3)
Chloride: 104 mmol/L (ref 96–106)
Creatinine, Ser: 1.24 mg/dL — ABNORMAL HIGH (ref 0.57–1.00)
Globulin, Total: 3.3 g/dL (ref 1.5–4.5)
Glucose: 66 mg/dL — ABNORMAL LOW (ref 70–99)
Potassium: 4.6 mmol/L (ref 3.5–5.2)
Sodium: 143 mmol/L (ref 134–144)
Total Protein: 7.5 g/dL (ref 6.0–8.5)
eGFR: 45 mL/min/{1.73_m2} — ABNORMAL LOW (ref >59)

## 2021-12-05 ENCOUNTER — Telehealth: Payer: Self-pay

## 2021-12-05 DIAGNOSIS — M793 Panniculitis, unspecified: Secondary | ICD-10-CM

## 2021-12-05 DIAGNOSIS — Z79899 Other long term (current) drug therapy: Secondary | ICD-10-CM

## 2021-12-05 NOTE — Telephone Encounter (Signed)
Called patient and advised: Labs show a small increase in creatinine, a measure of how well the kidney is working. It is higher than the last several checks, but similar to her results a year ago. Recommend repeat CMP test in 3 weeks to see if her kidneys are back to normal.   Recommend stopping hydroxychloroquine until her repeat result is back since some studies have suggested hydroxychloroquine might slow down kidney recovery after acute kidney injury (which can be seen sometimes from viruses among other causes).   Lab order for 3 weeks mailed to patient. JP

## 2021-12-05 NOTE — Telephone Encounter (Signed)
-----   Message from Alfonso Patten, MD sent at 12/05/2021  2:15 PM EST ----- Labs show a small increase in creatinine, a measure of how well the kidney is working. It is higher than the last several checks, but similar to her results a year ago. Recommend repeat CMP test in 3 weeks to see if her kidneys are back to normal.   Recommend stopping hydroxychloroquine until her repeat result is back since some studies have suggested hydroxychloroquine might slow down kidney recovery after acute kidney injury (which can be seen sometimes from viruses among other causes).  MAs please call. Thank you!

## 2021-12-12 ENCOUNTER — Ambulatory Visit: Payer: Medicare Other | Admitting: Dermatology

## 2021-12-15 ENCOUNTER — Encounter: Payer: Medicare Other | Admitting: Thoracic Surgery (Cardiothoracic Vascular Surgery)

## 2021-12-21 NOTE — Progress Notes (Signed)
[address]       ,[place] [id number]             [phone]                    Delois E Onley [place] Medical Record #[id number] Date of Birth: 02-09-1945  Referring: Tanda Rockers, MD Primary Care: Pleas Koch, NP Primary Cardiologist: Nelva Bush, MD  Chief Complaint:    Chief Complaint  Patient presents with   Lung Lesion    PET 11/21, Chest CT 10/30, PFT's 8/30    History of Present Illness:    Teresa Jensen 76 y.o. female presents for surgical evaluation of a left upper lobe pulmonary nodule with avidity on PET/CT.  She previously was a heavy smoker but quit in 2011.  She is chronically short of breath and has a cough.  She also has been on supplemental oxygen at home in the past after having pneumonia.  She denies any weight changes or neurologic symptoms.      Zubrod Score: At the time of surgery this patient's most appropriate activity status/level should be described as: []     0    Normal activity, no symptoms [x]     1    Restricted in physical strenuous activity but ambulatory, able to do out light work []     2    Ambulatory and capable of self care, unable to do work activities, up and about               >50 % of waking hours                              []     3    Only limited self care, in bed greater than 50% of waking hours []     4    Completely disabled, no self care, confined to bed or chair []     5    Moribund   Past Medical History:  Diagnosis Date   (HFpEF) heart failure with preserved ejection fraction (HCC)    Anxiety    Aortic atherosclerosis ([place])    CAP (community acquired pneumonia)    COPD (chronic obstructive pulmonary disease) ([place])    Dyspnea    DUE TO HEAT WITH COPD   Epistaxis 01/09/2017   GERD (gastroesophageal reflux disease)    OCC-NO MEDS   Grade I diastolic dysfunction    Hypertension    Hypothyroidism    Iliac artery stenosis, bilateral ([place])    New onset of headaches     Osteopenia    PMR (polymyalgia rheumatica) (HCC)    Pneumonia 02/13/2018   PONV (postoperative nausea and vomiting)    VERY NAUSEATED AFTER SURGERY   Shingles    Stasis dermatitis    Strain of chest wall 01/20/2011   Tobacco abuse     Past Surgical History:  Procedure Laterality Date   ABDOMINAL HYSTERECTOMY     APPENDECTOMY     BACK SURGERY     LUMBAR FUSION   CARPAL TUNNEL RELEASE Left    CARPAL TUNNEL RELEASE Left 08/31/2019   Procedure: CARPAL TUNNEL RELEASE;  Surgeon: Dereck Leep, MD;  Location: [facility] ORS;  Service: Orthopedics;  Laterality: Left;   CARPAL TUNNEL RELEASE Right 06/22/2020   Procedure: CARPAL TUNNEL RELEASE;  Surgeon: Dereck Leep, MD;  Location: [facility] OR[facility];  Service: Orthopedics;  Laterality: Right;   CERVICAL FUSION     CHOLECYSTECTOMY     HEMORRHOID SURGERY     LUMBAR FUSION     TUBAL LIGATION      Family History  Problem Relation Age of Onset   Heart attack Mother 60   Cancer Father        lung/chest wall    Prostate cancer Father    Diabetes Brother        siblings   Hypertension Sister        x 2   Heart disease Brother    Breast cancer Paternal Grandmother    Heart disease Sister    Diabetes Sister    Tuberculosis Sister    Cirrhosis Brother      Social History   Tobacco Use  Smoking Status Former   Packs/day: 1.50   Years: 20.00   Total pack years: 30.00   Types: Cigarettes   Quit date: 02/11/2009   Years since quitting: 12.8  Smokeless Tobacco Never    Social History   Substance and Sexual Activity  Alcohol Use No   Alcohol/week: 0.0 standard drinks of alcohol     Allergies  Allergen Reactions   Epinephrine Anaphylaxis and Shortness Of Breath   Food Rash    MANGO = cause rashes around the mouth LIPS GO NUMB   Mangifera Indica Rash    THIS IS MANGO LIPS GO NUMB   Other Rash    MANGO = cause rashes around the mouth MANGO = cause rashes around the mouth LIPS GO NUMB   Prednisone Anaphylaxis, Hives, Shortness  Of Breath, Itching and Swelling    The face swells HAS TAKEN CORTISONE SHOTS WITHOUT ISSUE BEFORE   Milk-Related Compounds Diarrhea    DAIRY PRODUCTS ARE A PROBLEM   Pneumococcal 13-Val Conj Vacc Itching and Other (See Comments)    Arm temp elevated at site of injection, red, itching   Prevnar 13 [Pneumococcal 13-Val Conj Vacc] Other (See Comments)    Arm temp elevated at site of injection, red, itching    Current Outpatient Medications  Medication Sig Dispense Refill   amLODipine (NORVASC) 5 MG tablet TAKE 1 TABLET BY MOUTH EVERY DAY FOR BLOOD PRESSURE 90 tablet 2   Calcium Carb-Cholecalciferol (CALCIUM + VITAMIN D3 PO) Take 1,200 mg by mouth daily.     famotidine (PEPCID) 20 MG tablet One after supper 30 tablet 11   Glycopyrrolate-Formoterol (BEVESPI AEROSPHERE) 9-4.8 MCG/ACT AERO Inhale 2 puffs into the lungs 2 (two) times daily. 1 each 11   hydroxychloroquine (PLAQUENIL) 200 MG tablet TAKE 1 TABLET BY MOUTH TWICE A DAY 180 tablet 1   levothyroxine (SYNTHROID) 75 MCG tablet TAKE 1 TABLET BY MOUTH EVERY MORNING ON AN EMPTY STOMACH WITH WATER ONLY. NO FOOD OR OTHER MEDICATIONS FOR 30 MINUTES. 90 tablet 3   losartan (COZAAR) 100 MG tablet TAKE 1 TABLET BY MOUTH EVERY DAY 90 tablet 2   Multiple Vitamins-Minerals (PRESERVISION AREDS 2 PO) Take 1 tablet by mouth in the morning and at bedtime.     pantoprazole (PROTONIX) 40 MG tablet Take 1 tablet (40 mg total) by mouth daily. Take 30-60 min before first meal of the day 30 tablet 2   tacrolimus (PROTOPIC) 0.1 % ointment Apply to aa's lower legs BID PRN. 60 g 3   No current facility-administered medications for this visit.    Review of Systems  Constitutional:  Negative for diaphoresis, fever and weight loss.  Respiratory:  Positive for cough and shortness  of breath.   Cardiovascular:  Negative for chest pain.  Neurological: Negative.      PHYSICAL EXAMINATION: BP (!) 168/79 (BP Location: Left Arm, Patient Position: Sitting)   Pulse  95   Resp 18   Ht 4\' 9"  (1.448 m)   Wt 188 lb (85.3 kg)   SpO2 90% Comment: RA  BMI 40.68 kg/m  Physical Exam Constitutional:      General: She is not in acute distress.    Appearance: Normal appearance. She is not ill-appearing.  HENT:     Head: Normocephalic and atraumatic.  Eyes:     Extraocular Movements: Extraocular movements intact.  Cardiovascular:     Rate and Rhythm: Normal rate.  Pulmonary:     Effort: Pulmonary effort is normal. No respiratory distress.  Abdominal:     General: There is no distension.  Musculoskeletal:        General: Normal range of motion.     Cervical back: Normal range of motion.  Skin:    General: Skin is warm and dry.  Neurological:     Mental Status: She is alert.     Diagnostic Studies & Laboratory data:     Recent Radiology Findings:   No results found.     I have independently reviewed the above radiology studies  and reviewed the findings with the patient.   Recent Lab Findings: Lab Results  Component Value Date   WBC 9.5 07/27/2021   HGB 12.3 07/27/2021   HCT 40.3 07/27/2021   PLT 315 07/27/2021   GLUCOSE 66 (L) 11/30/2021   CHOL 141 02/28/2021   TRIG 114.0 02/28/2021   HDL 52.40 02/28/2021   LDLCALC 66 02/28/2021   ALT 8 11/30/2021   AST 16 11/30/2021   NA 143 11/30/2021   K 4.6 11/30/2021   CL 104 11/30/2021   CREATININE 1.24 (H) 11/30/2021   BUN 21 11/30/2021   CO2 25 11/30/2021   TSH 4.21 02/28/2021   INR 1.16 01/07/2017   HGBA1C 5.3 02/28/2021     PFTs:  - FVC: 71% - FEV1: 65% -DLCO: 51%  Problem List: PET avid 1.3cm LUL pulm nodule Mild subcarinal avidity Marginal pulm function  IMPRESSION: 1. Hypermetabolic 13 mm left upper lobe pulmonary nodule, compatible with primary bronchogenic neoplasm. 2. Mildly metabolic precarinal lymph lymph node, is nonspecific but given its preserved fatty hilum is favored reactive. 3. No hypermetabolic left hilar lymph nodes. 4. No evidence of hypermetabolic  distant metastatic disease. 5. Colonic diverticulosis with a short segment of hypermetabolic sigmoid colonic bowel activity, which is nonspecific and may reflect sequela of chronic diverticulitis/ Segmental colitis associated with diverticulosis . However, if not recently performed consider further evaluation with colonoscopy to exclude a discrete mass lesion. 6.  Aortic Atherosclerosis (ICD10-I70.0).   Assessment / Plan:   76 year old female with a 1.3 cm left upper lobe pulmonary nodule with increased avidity on PET/CT.  She also has some mild uptake in the subcarinal lymph node.  I recommended that she undergo a navigational bronchoscopy with EBUS biopsy of these 2 areas.  We discussed several options for treatment if the biopsy proves to be a primary lung cancer.  This includes surgical resection versus SBRT.  Based off of her lung function she would be a marginal candidate for surgical resection and likely would require lifelong supplemental oxygen.  Furthermore if the subcarinal lymph node is positive, then she would not be a surgical candidate.  I will follow-up with her after her biopsy.  I  spent 40 minutes with  the patient face to face in counseling and coordination of care.    Lajuana Matte 12/22/2021 12:39 PM

## 2021-12-22 ENCOUNTER — Institutional Professional Consult (permissible substitution) (INDEPENDENT_AMBULATORY_CARE_PROVIDER_SITE_OTHER): Payer: Medicare Other | Admitting: Thoracic Surgery (Cardiothoracic Vascular Surgery)

## 2021-12-22 VITALS — BP 168/79 | HR 95 | Resp 18 | Ht <= 58 in | Wt 188.0 lb

## 2021-12-22 DIAGNOSIS — R911 Solitary pulmonary nodule: Secondary | ICD-10-CM

## 2021-12-27 ENCOUNTER — Telehealth: Payer: Self-pay | Admitting: Internal Medicine

## 2021-12-27 ENCOUNTER — Telehealth: Payer: Self-pay

## 2021-12-27 NOTE — Telephone Encounter (Signed)
Dr. Melvyn Novas, you referred this patient to Dr. Kipp Brood. Dr. Kipp Brood has referred the patient back to you for an EBUS. Do you want her to see one of the EBUS docs?

## 2021-12-27 NOTE — Telephone Encounter (Signed)
Left message reminding patient to go to lab.

## 2021-12-27 NOTE — Telephone Encounter (Signed)
-----   Message from Emelia Salisbury, [place] sent at 12/05/2021  3:47 PM EST ----- Regarding: Labs Call patient. Remind to go to lab.

## 2021-12-27 NOTE — Telephone Encounter (Signed)
Refer to whichever ebus doc of ours can get to her first

## 2021-12-27 NOTE — Telephone Encounter (Signed)
Received referral from Dr Kipp Brood requesting pt have a new bronch and EBUS. LOV 10/19/21 with MW. Pt is due for f/u in January. Please advise on getting a bronch scheduled.

## 2021-12-28 ENCOUNTER — Telehealth: Payer: Self-pay

## 2021-12-28 LAB — COMPREHENSIVE METABOLIC PANEL
ALT: 12 IU/L (ref 0–32)
AST: 17 IU/L (ref 0–40)
Albumin/Globulin Ratio: 1.4 (ref 1.2–2.2)
Albumin: 4.1 g/dL (ref 3.8–4.8)
Alkaline Phosphatase: 76 IU/L (ref 44–121)
BUN/Creatinine Ratio: 18 (ref 12–28)
BUN: 16 mg/dL (ref 8–27)
Bilirubin Total: 0.3 mg/dL (ref 0.0–1.2)
CO2: 23 mmol/L (ref 20–29)
Calcium: 9.4 mg/dL (ref 8.7–10.3)
Chloride: 103 mmol/L (ref 96–106)
Creatinine, Ser: 0.9 mg/dL (ref 0.57–1.00)
Globulin, Total: 3 g/dL (ref 1.5–4.5)
Glucose: 110 mg/dL — ABNORMAL HIGH (ref 70–99)
Potassium: 4.4 mmol/L (ref 3.5–5.2)
Sodium: 142 mmol/L (ref 134–144)
Total Protein: 7.1 g/dL (ref 6.0–8.5)
eGFR: 66 mL/min/{1.73_m2} (ref >59)

## 2021-12-28 NOTE — Telephone Encounter (Signed)
Will get patient in.  She will need robotic and EBUS to evaluate lung nodule and mediastinal adenopathy.

## 2021-12-28 NOTE — Telephone Encounter (Signed)
LMOVM results WNL. Re-start hydroxychloroquine.

## 2021-12-28 NOTE — Telephone Encounter (Signed)
Spoke to patient and explained reason for appt. She voiced her understanding and agreed with appt. Appt scheduled 01/02/2022 at 3:30. Address provided.  Nothing further needed.

## 2021-12-28 NOTE — Telephone Encounter (Signed)
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 Patsey Berthold and Dr. Genia Harold to determine which provider can see her and schedule the procedure first.  Lesleigh Noe, can you assist with this?  Thank you.

## 2021-12-28 NOTE — Telephone Encounter (Signed)
Lm for patient to offer 01/02/2022 at 3:30 with Dr. Patsey Berthold.

## 2021-12-28 NOTE — Telephone Encounter (Signed)
-----   Message from Alfonso Patten, MD sent at 12/28/2021 11:23 AM EST ----- Repeat blood work shows kidney test normal. Ok to restart hydroxychloroquine.  MAs please call. Thank you!  CC PCP

## 2022-01-02 ENCOUNTER — Telehealth: Payer: Self-pay

## 2022-01-02 ENCOUNTER — Encounter: Payer: Self-pay | Admitting: Pulmonary Disease

## 2022-01-02 ENCOUNTER — Ambulatory Visit (INDEPENDENT_AMBULATORY_CARE_PROVIDER_SITE_OTHER): Payer: Medicare Other | Admitting: Pulmonary Disease

## 2022-01-02 VITALS — BP 134/82 | HR 100 | Temp 97.5°F | Ht <= 58 in | Wt 187.2 lb

## 2022-01-02 DIAGNOSIS — R911 Solitary pulmonary nodule: Secondary | ICD-10-CM | POA: Diagnosis not present

## 2022-01-02 DIAGNOSIS — K219 Gastro-esophageal reflux disease without esophagitis: Secondary | ICD-10-CM | POA: Diagnosis not present

## 2022-01-02 DIAGNOSIS — J8410 Pulmonary fibrosis, unspecified: Secondary | ICD-10-CM | POA: Diagnosis not present

## 2022-01-02 DIAGNOSIS — J449 Chronic obstructive pulmonary disease, unspecified: Secondary | ICD-10-CM

## 2022-01-02 NOTE — Patient Instructions (Addendum)
We are scheduling you for a biopsy of your left lung where the nodule is.  Currently this is between the size of a cheerio and a dime.   We have scheduled you for the biopsy on 17 January at 12:30 PM.  This will be done in the main hospital.  You will be receiving more information and preadmission calls prior to that.   Please make sure you use your Bevespi 2 puffs twice a day regularly.   We will see you in follow-up in 4 to 6 weeks time however we are going to be staying in contact with you before, during and after the procedure.  Should you have any questions before that time please do not hesitate to contact us.

## 2022-01-02 NOTE — Progress Notes (Signed)
Subjective:    Patient ID: Teresa Jensen, female    DOB: May 31, 1945, 77 y.o.   MRN: [id number] Patient Care Team: Pleas Koch, NP as PCP - General (Internal Medicine) End, Harrell Gave, MD as PCP - Cardiology (Cardiology) [place], Jennefer Bravo, MD as Referring Physician (Dermatology) Vladimir Crofts, MD as Consulting Physician (Neurology) Birder Robson, MD as Referring Physician (Ophthalmology) Alfonso Patten, MD as Consulting Physician (Dermatology)  Chief Complaint  Patient presents with   Follow-up    Nodule. No SOB, Wheezing or cough.   HPI Patient is a 77 year old remote former smoker (32 PY) with a history as noted below, who presents for evaluation of a left upper lobe lung nodule.  She has been followed by Dr. Christinia Gully at the [facility] that [place] office.  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  This triggered the request of a high-resolution CT chest which was performed on 30 October 2021.  This revealed a left upper lobe nodule and subsequently the patient had PET/CT which showed hypermetabolism on this nodule and questionable mild hypermetabolism on the precarinal lymph nodes.  The patient presents for consideration of robotic bronchoscopy for diagnosis of her left upper lobe nodule.  Patient has been asymptomatic with regards to the nodule in question.  She does have COPD as noted above however has only been using Bevespi and albuterol as needed.  She does note improvement in symptoms with Bevespi when she uses it.  She has not had any cough or sputum production.  No hemoptysis.  No weight loss or anorexia.  No other complaint voiced at present.  It is anxious and somewhat tearful during the interview.  Patient is here today with her husband and the films were reviewed in detail with them.   DATA 06/2010 PFTs: FEV1 1.20 (56%), FVC 1.77 L or 63% predicted, FEV1/FVC 68%, significant airtrapping (RV 207%), DLCO 46% but  nearly corrects with Alv vol. 08/30/2021 PFTs: FEV1 0.95 L or 85% predicted, FVC 1.41 L or 71% predicted, FEV1/FVC 67%, normal lung volumes, no significant response.  Moderate diffusion capacity impairment which corrects to alveolar volume.  Review of Systems A 10 point review of systems was performed and it is as noted above otherwise negative.  Past Medical History:  Diagnosis Date   (HFpEF) heart failure with preserved ejection fraction (HCC)    Anxiety    Aortic atherosclerosis (HCC)    CAP (community acquired pneumonia)    COPD (chronic obstructive pulmonary disease) ([place])    Dyspnea    DUE TO HEAT WITH COPD   Epistaxis 01/09/2017   GERD (gastroesophageal reflux disease)    OCC-NO MEDS   Grade I diastolic dysfunction    Hypertension    Hypothyroidism    Iliac artery stenosis, bilateral ([place])    New onset of headaches    Osteopenia    PMR (polymyalgia rheumatica) (HCC)    Pneumonia 02/13/2018   PONV (postoperative nausea and vomiting)    VERY NAUSEATED AFTER SURGERY   Shingles    Stasis dermatitis    Strain of chest wall 01/20/2011   Tobacco abuse    Past Surgical History:  Procedure Laterality Date   ABDOMINAL HYSTERECTOMY     APPENDECTOMY     BACK SURGERY     LUMBAR FUSION   CARPAL TUNNEL RELEASE Left    CARPAL TUNNEL RELEASE Left 08/31/2019   Procedure: CARPAL TUNNEL RELEASE;  Surgeon: Dereck Leep, MD;  Location: [facility] ORS;  Service: Orthopedics;  Laterality: Left;   CARPAL TUNNEL RELEASE Right 06/22/2020   Procedure: CARPAL TUNNEL RELEASE;  Surgeon: Dereck Leep, MD;  Location: [facility] OR[facility];  Service: Orthopedics;  Laterality: Right;   CERVICAL FUSION     CHOLECYSTECTOMY     HEMORRHOID SURGERY     LUMBAR FUSION     TUBAL LIGATION     Patient Active Problem List   Diagnosis Date Noted   Solitary pulmonary nodule 11/27/2021   Upper extremity pain, anterior, right 08/04/2021   Lower extremity pain 08/04/2021   Postinflammatory pulmonary fibrosis ([place])  07/27/2021   Acute cough 05/05/2021   Acute gastritis 05/01/2021   Morbid obesity ([place]) 05/01/2021   Grief 10/12/2020   Foot callus 09/01/2020   S/P carpal tunnel release 08/04/2020   Rash and nonspecific skin eruption 07/07/2020   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   Personal history of colonic polyps 11/01/2019   Bilateral carpal tunnel syndrome 08/16/2019   Skin mass 04/30/2019   Chronic heart failure with preserved ejection fraction (HFpEF) ([place]) 11/12/2018   DOE (dyspnea on exertion) 11/12/2018   Abnormal electrocardiogram 11/12/2018   Left leg pain 04/17/2018   COPD exacerbation ([place]) 01/21/2018   Osteoporosis 12/16/2017   Back pain 06/06/2017   Low vitamin B12 level 01/13/2017   Iatrogenic hypotension 01/12/2017   Acute kidney failure ([place]) 01/12/2017   Anemia 01/12/2017   Essential hypertension 01/08/2017   Epistaxis 01/07/2017   Hand numbness 10/22/2016   Right hand weakness 10/22/2016   Closed fracture of metatarsal bone 09/28/2016   Prediabetes 10/17/2015   Herpes zoster 07/08/2015   Pain of right lower extremity 05/20/2015   Medicare annual wellness visit, subsequent 10/12/2014   Occipital neuralgia 10/12/2014   Vitamin D deficiency 10/12/2014   Erythema of lower extremity 09/08/2013   Persistent cough 06/14/2010   COPD GOLD 2  06/05/2010   Hypothyroidism 09/29/2009   Situational anxiety 10/01/2008   COPD 10/01/2008   GERD 10/01/2008   Osteoarthritis 10/01/2008   Family History  Problem Relation Age of Onset   Heart attack Mother 39   Cancer Father        lung/chest wall    Prostate cancer Father    Diabetes Brother        siblings   Hypertension Sister        x 2   Heart disease Brother    Breast cancer Paternal Grandmother    Heart disease Sister    Diabetes Sister    Tuberculosis Sister    Cirrhosis Brother    Social History   Tobacco Use   Smoking status: Former    Packs/day: 1.50    Years: 20.00    Total pack years: 30.00     Types: Cigarettes    Quit date: 02/11/2009    Years since quitting: 12.8   Smokeless tobacco: Never  Substance Use Topics   Alcohol use: No    Alcohol/week: 0.0 standard drinks of alcohol   Allergies  Allergen Reactions   Epinephrine Anaphylaxis and Shortness Of Breath   Food Rash    MANGO = cause rashes around the mouth LIPS GO NUMB   Mangifera Indica Rash    THIS IS MANGO LIPS GO NUMB   Other Rash    MANGO = cause rashes around the mouth MANGO = cause rashes around the mouth LIPS GO NUMB   Prednisone Anaphylaxis, Hives, Shortness Of Breath, Itching and Swelling    The face swells HAS TAKEN CORTISONE SHOTS WITHOUT ISSUE  BEFORE   Milk-Related Compounds Diarrhea    DAIRY PRODUCTS ARE A PROBLEM   Pneumococcal 13-Val Conj Vacc Itching and Other (See Comments)    Arm temp elevated at site of injection, red, itching   Prevnar 13 [Pneumococcal 13-Val Conj Vacc] Other (See Comments)    Arm temp elevated at site of injection, red, itching   Current Meds  Medication Sig   amLODipine (NORVASC) 5 MG tablet TAKE 1 TABLET BY MOUTH EVERY DAY FOR BLOOD PRESSURE   Calcium Carb-Cholecalciferol (CALCIUM + VITAMIN D3 PO) Take 1,200 mg by mouth daily.   famotidine (PEPCID) 20 MG tablet One after supper   Glycopyrrolate-Formoterol (BEVESPI AEROSPHERE) 9-4.8 MCG/ACT AERO Inhale 2 puffs into the lungs 2 (two) times daily.   hydroxychloroquine (PLAQUENIL) 200 MG tablet TAKE 1 TABLET BY MOUTH TWICE A DAY   levothyroxine (SYNTHROID) 75 MCG tablet TAKE 1 TABLET BY MOUTH EVERY MORNING ON AN EMPTY STOMACH WITH WATER ONLY. NO FOOD OR OTHER MEDICATIONS FOR 30 MINUTES.   losartan (COZAAR) 100 MG tablet TAKE 1 TABLET BY MOUTH EVERY DAY   Multiple Vitamins-Minerals (PRESERVISION AREDS 2 PO) Take 1 tablet by mouth in the morning and at bedtime.   tacrolimus (PROTOPIC) 0.1 % ointment Apply to aa's lower legs BID PRN.   Immunization History  Administered Date(s) Administered   Fluad Quad(high Dose 65+)  09/02/2020, 10/12/2021   Influenza Whole 10/01/2008   Influenza,inj,Quad PF,6+ Mos 10/12/2014, 10/13/2015, 10/17/2016, 10/17/2017, 10/24/2018, 10/27/2019   PFIZER(Purple Top)SARS-COV-2 Vaccination 03/11/2019, 04/01/2019   Pneumococcal Conjugate-13 10/12/2014   Td 10/01/2008      Objective:   Physical Exam BP 134/82 (BP Location: Left Arm, Cuff Size: Normal)   Pulse 100   Temp (!) 97.5 F (36.4 C)   Ht 4\' 9"  (1.448 m)   Wt 187 lb 3.2 oz (84.9 kg)   SpO2 90%   BMI 40.51 kg/m  GENERAL: Obese woman, no acute distress.  Fully ambulatory, no conversational dyspnea. HEAD: Normocephalic, atraumatic.  EYES: Pupils equal, round, reactive to light.  No scleral icterus.  MOUTH: Dentition intact, Mallampati I, oral mucosa moist. NECK: Supple. No thyromegaly. Trachea midline. No JVD.  No adenopathy. PULMONARY: Good air entry bilaterally.  Coarse, otherwise, no adventitious sounds. CARDIOVASCULAR: S1 and S2. Regular rate and rhythm.  Grade 1/6 systolic ejection murmur in the mitral position, no gallops or rubs noted. ABDOMEN: Obese, otherwise benign. MUSCULOSKELETAL: No joint deformity, no clubbing, no edema.  NEUROLOGIC: No overt focal deficit, no gait disturbance, speech is fluent. SKIN: Intact,warm,dry. PSYCH: Calm but occasionally tearful.  Behavior normal.   Representative image of high resolution CT performed 30 October 2021 showing 1.5 x 1.1 cm nodule in the left upper lobe:    Representative image from PET/CT performed 21 November 2021 showing hypermetabolism on the nodule in question:     Assessment & Plan:     ICD-10-CM   1. Solitary lung nodule - LUL 1.5 cm  R91.1 CT SUPER D CHEST WO [facility] PILOT   Hypermetabolic on PET/CT Cannot exclude precarinal adenopathy Robotic assisted navigational bronchoscopy for biopsy EBUS evaluation of mediastinal adenopathy    2. COPD GOLD 2   J44.9    Take Bevespi regularly, 2 puffs twice a day Continue as needed albuterol    3.  Postinflammatory pulmonary fibrosis (HCC)  J84.10    This issue adds complexity to her management    4. Gastroesophageal reflux disease, unspecified whether esophagitis present  K21.9    States controlled on Protonix This issue adds  complexity to her management     Orders Placed This Encounter  Procedures   CT SUPER D CHEST WO [facility] PILOT    Standing Status:   Future    Standing Expiration Date:   01/03/2023    Order Specific Question:   Preferred imaging location?    Answer:   [place]   Patient needs biopsy of the left upper lobe lung nodule.  This is malignancy until proven otherwise.  Different options of biopsy were discussed.  The safest modality would be robotic assisted navigational bronchoscopy.  Patient has been scheduled for 17 January at 12:30 PM.  Understands the potential benefits, limitations and complications of the procedure and agrees to proceed.  Patient understands the procedure will be done under general anesthesia.  Benefits, limitations and potential complications of the procedure were discussed with the patient/family.  Complications from bronchoscopy are rare and most often minor, but if they occur they may include breathing difficulty, vocal cord spasm, hoarseness, slight fever, vomiting, dizziness, bronchospasm, infection, low blood oxygen, bleeding from biopsy site, or an allergic reaction to medications.  It is uncommon for patients to experience other more serious complications for example: Collapsed lung requiring chest tube placement, respiratory failure, heart attack and/or cardiac arrhythmia.  Patient agrees to go ahead.  Will see the patient in follow-up in 4 to 6 weeks time she is to contact us prior to that time should any new difficulties arise.  Renold Don, MD Advanced Bronchoscopy PCCM  [facility]    *This note was dictated using voice recognition software/Dragon.  Despite best efforts to proofread, errors can occur  which can change the meaning. Any transcriptional errors that result from this process are unintentional and may not be fully corrected at the time of dictation.

## 2022-01-02 NOTE — Telephone Encounter (Signed)
For the codes 31627, [phone], [phone] Prior Auth Not Required patient has Medicare [place]

## 2022-01-02 NOTE — H&P (View-Only) (Signed)
Subjective:    Patient ID: Teresa Jensen, female    DOB: 10-07-1945, 77 y.o.   MRN: [id number] Patient Care Team: Doreene Nest, NP as PCP - General (Internal Medicine) End, Cristal Deer, MD as PCP - Cardiology (Cardiology) Haverstock, Elvin So, MD as Referring Physician (Dermatology) Lonell Face, MD as Consulting Physician (Neurology) Galen Manila, MD as Referring Physician (Ophthalmology) Sandi Mealy, MD as Consulting Physician (Dermatology)  Chief Complaint  Patient presents with   Follow-up    Nodule. No SOB, Wheezing or cough.   HPI Patient is a 77 year old remote former smoker (30 PY) with a history as noted below, who presents for evaluation of a left upper lobe lung nodule.  She has been followed by Dr. Sandrea Hughs at the [facility] that [place] office.  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  This triggered the request of a high-resolution CT chest which was performed on 30 October 2021.  This revealed a left upper lobe nodule and subsequently the patient had PET/CT which showed hypermetabolism on this nodule and questionable mild hypermetabolism on the precarinal lymph nodes.  The patient presents for consideration of robotic bronchoscopy for diagnosis of her left upper lobe nodule.  Patient has been asymptomatic with regards to the nodule in question.  She does have COPD as noted above however has only been using Bevespi and albuterol as needed.  She does note improvement in symptoms with Bevespi when she uses it.  She has not had any cough or sputum production.  No hemoptysis.  No weight loss or anorexia.  No other complaint voiced at present.  It is anxious and somewhat tearful during the interview.  Patient is here today with her husband and the films were reviewed in detail with them.   DATA 06/2010 PFTs: FEV1 1.20 (56%), FVC 1.77 L or 63% predicted, FEV1/FVC 68%, significant airtrapping (RV 207%), DLCO 46% but  nearly corrects with Alv vol. 08/30/2021 PFTs: FEV1 0.95 L or 85% predicted, FVC 1.41 L or 71% predicted, FEV1/FVC 67%, normal lung volumes, no significant response.  Moderate diffusion capacity impairment which corrects to alveolar volume.  Review of Systems A 10 point review of systems was performed and it is as noted above otherwise negative.  Past Medical History:  Diagnosis Date   (HFpEF) heart failure with preserved ejection fraction (HCC)    Anxiety    Aortic atherosclerosis (HCC)    CAP (community acquired pneumonia)    COPD (chronic obstructive pulmonary disease) (HCC)    Dyspnea    DUE TO HEAT WITH COPD   Epistaxis 01/09/2017   GERD (gastroesophageal reflux disease)    OCC-NO MEDS   Grade I diastolic dysfunction    Hypertension    Hypothyroidism    Iliac artery stenosis, bilateral (HCC)    New onset of headaches    Osteopenia    PMR (polymyalgia rheumatica) (HCC)    Pneumonia 02/13/2018   PONV (postoperative nausea and vomiting)    VERY NAUSEATED AFTER SURGERY   Shingles    Stasis dermatitis    Strain of chest wall 01/20/2011   Tobacco abuse    Past Surgical History:  Procedure Laterality Date   ABDOMINAL HYSTERECTOMY     APPENDECTOMY     BACK SURGERY     LUMBAR FUSION   CARPAL TUNNEL RELEASE Left    CARPAL TUNNEL RELEASE Left 08/31/2019   Procedure: CARPAL TUNNEL RELEASE;  Surgeon: Donato Heinz, MD;  Location: [facility] ORS;  Service: Orthopedics;  Laterality: Left;   CARPAL TUNNEL RELEASE Right 06/22/2020   Procedure: CARPAL TUNNEL RELEASE;  Surgeon: Donato Heinz, MD;  Location: [facility] ORS;  Service: Orthopedics;  Laterality: Right;   CERVICAL FUSION     CHOLECYSTECTOMY     HEMORRHOID SURGERY     LUMBAR FUSION     TUBAL LIGATION     Patient Active Problem List   Diagnosis Date Noted   Solitary pulmonary nodule 11/27/2021   Upper extremity pain, anterior, right 08/04/2021   Lower extremity pain 08/04/2021   Postinflammatory pulmonary fibrosis (HCC)  07/27/2021   Acute cough 05/05/2021   Acute gastritis 05/01/2021   Morbid obesity (HCC) 05/01/2021   Grief 10/12/2020   Foot callus 09/01/2020   S/P carpal tunnel release 08/04/2020   Rash and nonspecific skin eruption 07/07/2020   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   Personal history of colonic polyps 11/01/2019   Bilateral carpal tunnel syndrome 08/16/2019   Skin mass 04/30/2019   Chronic heart failure with preserved ejection fraction (HFpEF) (HCC) 11/12/2018   DOE (dyspnea on exertion) 11/12/2018   Abnormal electrocardiogram 11/12/2018   Left leg pain 04/17/2018   COPD exacerbation (HCC) 01/21/2018   Osteoporosis 12/16/2017   Back pain 06/06/2017   Low vitamin B12 level 01/13/2017   Iatrogenic hypotension 01/12/2017   Acute kidney failure (HCC) 01/12/2017   Anemia 01/12/2017   Essential hypertension 01/08/2017   Epistaxis 01/07/2017   Hand numbness 10/22/2016   Right hand weakness 10/22/2016   Closed fracture of metatarsal bone 09/28/2016   Prediabetes 10/17/2015   Herpes zoster 07/08/2015   Pain of right lower extremity 05/20/2015   Medicare annual wellness visit, subsequent 10/12/2014   Occipital neuralgia 10/12/2014   Vitamin D deficiency 10/12/2014   Erythema of lower extremity 09/08/2013   Persistent cough 06/14/2010   COPD GOLD 2  06/05/2010   Hypothyroidism 09/29/2009   Situational anxiety 10/01/2008   COPD 10/01/2008   GERD 10/01/2008   Osteoarthritis 10/01/2008   Family History  Problem Relation Age of Onset   Heart attack Mother 35   Cancer Father        lung/chest wall    Prostate cancer Father    Diabetes Brother        siblings   Hypertension Sister        x 2   Heart disease Brother    Breast cancer Paternal Grandmother    Heart disease Sister    Diabetes Sister    Tuberculosis Sister    Cirrhosis Brother    Social History   Tobacco Use   Smoking status: Former    Packs/day: 1.50    Years: 20.00    Total pack years: 30.00     Types: Cigarettes    Quit date: 02/11/2009    Years since quitting: 12.8   Smokeless tobacco: Never  Substance Use Topics   Alcohol use: No    Alcohol/week: 0.0 standard drinks of alcohol   Allergies  Allergen Reactions   Epinephrine Anaphylaxis and Shortness Of Breath   Food Rash    MANGO = cause rashes around the mouth LIPS GO NUMB   M[employer] Rash    THIS IS MANGO LIPS GO NUMB   Other Rash    MANGO = cause rashes around the mouth MANGO = cause rashes around the mouth LIPS GO NUMB   Prednisone Anaphylaxis, Hives, Shortness Of Breath, Itching and Swelling    The face swells HAS TAKEN CORTISONE SHOTS WITHOUT ISSUE  BEFORE   Milk-Related Compounds Diarrhea    DAIRY PRODUCTS ARE A PROBLEM   Pneumococcal 13-Val Conj Vacc Itching and Other (See Comments)    Arm temp elevated at site of injection, red, itching   Prevnar 13 [Pneumococcal 13-Val Conj Vacc] Other (See Comments)    Arm temp elevated at site of injection, red, itching   Current Meds  Medication Sig   amLODipine (NORVASC) 5 MG tablet TAKE 1 TABLET BY MOUTH EVERY DAY FOR BLOOD PRESSURE   Calcium Carb-Cholecalciferol (CALCIUM + VITAMIN D3 PO) Take 1,200 mg by mouth daily.   famotidine (PEPCID) 20 MG tablet One after supper   Glycopyrrolate-Formoterol (BEVESPI AEROSPHERE) 9-4.8 MCG/ACT AERO Inhale 2 puffs into the lungs 2 (two) times daily.   hydroxychloroquine (PLAQUENIL) 200 MG tablet TAKE 1 TABLET BY MOUTH TWICE A DAY   levothyroxine (SYNTHROID) 75 MCG tablet TAKE 1 TABLET BY MOUTH EVERY MORNING ON AN EMPTY STOMACH WITH WATER ONLY. NO FOOD OR OTHER MEDICATIONS FOR 30 MINUTES.   losartan (COZAAR) 100 MG tablet TAKE 1 TABLET BY MOUTH EVERY DAY   Multiple Vitamins-Minerals (PRESERVISION AREDS 2 PO) Take 1 tablet by mouth in the morning and at bedtime.   tacrolimus (PROTOPIC) 0.1 % ointment Apply to aa's lower legs BID PRN.   Immunization History  Administered Date(s) Administered   Fluad Quad(high Dose 65+)  09/02/2020, 10/12/2021   Influenza Whole 10/01/2008   Influenza,inj,Quad PF,6+ Mos 10/12/2014, 10/13/2015, 10/17/2016, 10/17/2017, 10/24/2018, 10/27/2019   PFIZER(Purple Top)SARS-COV-2 Vaccination 03/11/2019, 04/01/2019   Pneumococcal Conjugate-13 10/12/2014   Td 10/01/2008      Objective:   Physical Exam BP 134/82 (BP Location: Left Arm, Cuff Size: Normal)   Pulse 100   Temp (!) 97.5 F (36.4 C)   Ht 4\' 9"  (1.448 m)   Wt 187 lb 3.2 oz (84.9 kg)   SpO2 90%   BMI 40.51 kg/m  GENERAL: Obese woman, no acute distress.  Fully ambulatory, no conversational dyspnea. HEAD: Normocephalic, atraumatic.  EYES: Pupils equal, round, reactive to light.  No scleral icterus.  MOUTH: Dentition intact, Mallampati I, oral mucosa moist. NECK: Supple. No thyromegaly. Trachea midline. No JVD.  No adenopathy. PULMONARY: Good air entry bilaterally.  Coarse, otherwise, no adventitious sounds. CARDIOVASCULAR: S1 and S2. Regular rate and rhythm.  Grade 1/6 systolic ejection murmur in the mitral position, no gallops or rubs noted. ABDOMEN: Obese, otherwise benign. MUSCULOSKELETAL: No joint deformity, no clubbing, no edema.  NEUROLOGIC: No overt focal deficit, no gait disturbance, speech is fluent. SKIN: Intact,warm,dry. PSYCH: Calm but occasionally tearful.  Behavior normal.   Representative image of high resolution CT performed 30 October 2021 showing 1.5 x 1.1 cm nodule in the left upper lobe:    Representative image from PET/CT performed 21 November 2021 showing hypermetabolism on the nodule in question:     Assessment & Plan:     ICD-10-CM   1. Solitary lung nodule - LUL 1.5 cm  R91.1 CT SUPER D CHEST WO [facility] PILOT   Hypermetabolic on PET/CT Cannot exclude precarinal adenopathy Robotic assisted navigational bronchoscopy for biopsy EBUS evaluation of mediastinal adenopathy    2. COPD GOLD 2   J44.9    Take Bevespi regularly, 2 puffs twice a day Continue as needed albuterol    3.  Postinflammatory pulmonary fibrosis (HCC)  J84.10    This issue adds complexity to her management    4. Gastroesophageal reflux disease, unspecified whether esophagitis present  K21.9    States controlled on Protonix This issue adds  complexity to her management     Orders Placed This Encounter  Procedures   CT SUPER D CHEST WO [facility] PILOT    Standing Status:   Future    Standing Expiration Date:   01/03/2023    Order Specific Question:   Preferred imaging location?    Answer:   [facility]   Patient needs biopsy of the left upper lobe lung nodule.  This is malignancy until proven otherwise.  Different options of biopsy were discussed.  The safest modality would be robotic assisted navigational bronchoscopy.  Patient has been scheduled for 17 January at 12:30 PM.  Understands the potential benefits, limitations and complications of the procedure and agrees to proceed.  Patient understands the procedure will be done under general anesthesia.  Benefits, limitations and potential complications of the procedure were discussed with the patient/family.  Complications from bronchoscopy are rare and most often minor, but if they occur they may include breathing difficulty, vocal cord spasm, hoarseness, slight fever, vomiting, dizziness, bronchospasm, infection, low blood oxygen, bleeding from biopsy site, or an allergic reaction to medications.  It is uncommon for patients to experience other more serious complications for example: Collapsed lung requiring chest tube placement, respiratory failure, heart attack and/or cardiac arrhythmia.  Patient agrees to go ahead.  Will see the patient in follow-up in 4 to 6 weeks time she is to contact us prior to that time should any new difficulties arise.  Gailen Shelter, MD Advanced Bronchoscopy PCCM [facility]    *This note was dictated using voice recognition software/Dragon.  Despite best efforts to proofread, errors can occur  which can change the meaning. Any transcriptional errors that result from this process are unintentional and may not be fully corrected at the time of dictation.

## 2022-01-02 NOTE — Telephone Encounter (Signed)
Robotic Bronch with EBUS 01/17/2022 12:30pm  Left Upper Lobe Lung Nodule [id number], [id number], [id number]  Rodena Piety please see Bronch info.

## 2022-01-03 NOTE — Telephone Encounter (Signed)
PreAdmit- 01/08/2022 8-1p Covid- 01/15/2022 8-12p  I have notified the patient. Nothing further needed.

## 2022-01-08 ENCOUNTER — Encounter
Admission: RE | Admit: 2022-01-08 | Discharge: 2022-01-08 | Disposition: A | Discharge status: Home / Self Care | Payer: Medicare Other | Source: Ambulatory Visit | Attending: Pulmonary Disease | Admitting: Pulmonary Disease

## 2022-01-08 ENCOUNTER — Telehealth: Payer: Self-pay | Admitting: *Deleted

## 2022-01-08 ENCOUNTER — Encounter: Payer: Self-pay | Admitting: Pulmonary Disease

## 2022-01-08 VITALS — Ht <= 58 in | Wt 186.0 lb

## 2022-01-08 DIAGNOSIS — Z1152 Encounter for screening for COVID-19: Secondary | ICD-10-CM

## 2022-01-08 DIAGNOSIS — J441 Chronic obstructive pulmonary disease with (acute) exacerbation: Secondary | ICD-10-CM

## 2022-01-08 DIAGNOSIS — Z01812 Encounter for preprocedural laboratory examination: Secondary | ICD-10-CM

## 2022-01-08 DIAGNOSIS — I5032 Chronic diastolic (congestive) heart failure: Secondary | ICD-10-CM

## 2022-01-08 HISTORY — DX: Morbid (severe) obesity due to excess calories: E66.01

## 2022-01-08 HISTORY — DX: Carpal tunnel syndrome, bilateral upper limbs: G56.03

## 2022-01-08 HISTORY — DX: Solitary pulmonary nodule: R91.1

## 2022-01-08 HISTORY — DX: Anemia, unspecified: D64.9

## 2022-01-08 HISTORY — DX: Vitamin D deficiency, unspecified: E55.9

## 2022-01-08 NOTE — Telephone Encounter (Signed)
-----   Message from Karen Kitchens, NP sent at 01/08/2022  1:03 PM EST ----- Regarding: Request for pre-operative cardiac clearance Request for pre-operative cardiac clearance:  1. What type of surgery is being performed?  ENB/EBUS  2. When is this surgery scheduled?  01/17/2022  3. Type of clearance being requested (medical, pharmacy, both)? MEDICAL   4. Are there any medications that need to be held prior to surgery? NONE  5. Practice name and name of physician performing surgery?  Performing surgeon: Dr. Vernard Gambles, MD Requesting clearance: Honor Loh, FNP-C    6. Anesthesia type (none, local, MAC, general)? GENERAL  7. What is the office phone and fax number?   Fax: [phone]  ATTENTION: Unable to create telephone message as per your standard workflow. Directed by [facility] providers to send requests for cardiac clearance to this pool for appropriate distribution to provider covering pre-operative clearances.   Honor Loh, MSN, APRN, FNP-C, CEN [facility]  Peri-operative Services Nurse Practitioner Phone: [phone] 01/08/22 1:03 PM

## 2022-01-08 NOTE — Telephone Encounter (Signed)
Left message for the pt to call back and schedule a tele pre op appt.

## 2022-01-08 NOTE — Telephone Encounter (Signed)
   Name: IRINI LEET  DOB: 28-Jan-1945  MRN: [id number]  Primary Cardiologist: Nelva Bush, MD  Chart reviewed as part of pre-operative protocol coverage. Because of Pascale Maves Pomerleau's past medical history and time since last visit, she will require a follow-up telephone visit in order to better assess preoperative cardiovascular risk.  Pre-op covering staff: - Please schedule appointment and call patient to inform them. If patient already had an upcoming appointment within acceptable timeframe, please add "pre-op clearance" to the appointment notes so provider is aware. - Please contact requesting surgeon's office via preferred method (i.e, phone, fax) to inform them of need for appointment prior to surgery.  No medications are indicated as needing held.  Elgie Collard, PA-C  01/08/2022, 2:39 PM

## 2022-01-08 NOTE — Patient Instructions (Addendum)
Your procedure is scheduled on: Wednesday, January 17 Report to the Registration Desk on the 1st floor of the [facility]. To find out your arrival time, please call [phone] between 1PM - 3PM on: Tuesday, January 16 If your arrival time is 6:00 am, do not arrive prior to that time as the [facility] entrance doors do not open until 6:00 am.  REMEMBER: Instructions that are not followed completely may result in serious medical risk, up to and including death; or upon the discretion of your surgeon and anesthesiologist your surgery may need to be rescheduled.  Do not eat or drink after midnight the night before surgery.  No gum chewing, lozengers or hard candies.  TAKE THESE MEDICATIONS THE MORNING OF SURGERY WITH A SIP OF WATER:  Amlodipine Hydroxychloroquine (Plaquenil) Levothyroxine Pepcid - take one the night before surgery and one the morning of surgery - helps to prevent nausea after surgery.  Use bevespi inhaler on the day of surgery and bring to the hospital.  One week prior to surgery: starting January 10 Stop Anti-inflammatories (NSAIDS) such as Advil, Aleve, Ibuprofen, Motrin, Naproxen, Naprosyn and Aspirin based products such as Excedrin, Goodys Powder, BC Powder. Stop ANY OVER THE COUNTER supplements until after surgery. Stop preservision areds You may however, continue to take Tylenol if needed for pain up until the day of surgery.  No Alcohol for 24 hours before or after surgery.  No Smoking including e-cigarettes for 24 hours prior to surgery.  No chewable tobacco products for at least 6 hours prior to surgery.  No nicotine patches on the day of surgery.  Do not use any "recreational" drugs for at least a week prior to your surgery.  Please be advised that the combination of cocaine and anesthesia may have negative outcomes, up to and including death. If you test positive for cocaine, your surgery will be cancelled.  On the morning of surgery brush your teeth  with toothpaste and water, you may rinse your mouth with mouthwash if you wish. Do not swallow any toothpaste or mouthwash.  Do not wear jewelry, make-up, hairpins, clips or nail polish.  Do not wear lotions, powders, or perfumes.   Do not shave body from the neck down 48 hours prior to surgery just in case you cut yourself which could leave a site for infection.   Contact lenses, hearing aids and dentures may not be worn into surgery.  Do not bring valuables to the hospital. [facility] is not responsible for any missing/lost belongings or valuables.   Notify your doctor if there is any change in your medical condition (cold, fever, infection).  Wear comfortable clothing (specific to your surgery type) to the hospital.  If you are being discharged the day of surgery, you will not be allowed to drive home. You will need a responsible adult (18 years or older) to drive you home and stay with you that night.   If you are taking public transportation, you will need to have a responsible adult (18 years or older) with you. Please confirm with your physician that it is acceptable to use public transportation.   Please call the [facility] at [phone] if you have any questions about these instructions.  Surgery Visitation Policy:  Patients undergoing a surgery or procedure may have two family members or support persons with them as long as the person is not COVID-19 positive or experiencing its symptoms.

## 2022-01-08 NOTE — Progress Notes (Signed)
Perioperative Services  Pre-Admission/Anesthesia Testing Clinical Review  Date: 01/10/22  Patient Demographics:  Name: Teresa Jensen DOB:   April 21, 1945 MRN:   [id number]  Planned Surgical Procedure(s):    Case: [id number] Date/Time: 01/17/22 1230   Procedures:      ROBOTIC ASSISTED NAVIGATIONAL BRONCHOSCOPY (Left)     ENDOBRONCHIAL ULTRASOUND (Left)   Anesthesia type: General   Pre-op diagnosis: Left Upper Lobe Lung Nodule   Location: [facility] PROCEDURE RM 02 / [place] ORS FOR ANESTHESIA GROUP   Surgeons: Tyler Pita, MD   NOTE: Available PAT nursing documentation and vital signs have been reviewed. Clinical nursing staff has updated patient's PMH/PSHx, current medication list, and drug allergies/intolerances to ensure comprehensive history available to assist in medical decision making as it pertains to the aforementioned surgical procedure and anticipated anesthetic course. Extensive review of available clinical information performed. [place] PMH and PSHx updated with any diagnoses/procedures that  may have been inadvertently omitted during her intake with the pre-admission testing department's nursing staff.  Clinical Discussion:  Teresa Jensen is a 77 y.o. female who is submitted for pre-surgical anesthesia review and clearance prior to her undergoing the above procedure. Patient is a Former Smoker (30 pack years; quit 02/2009). Pertinent PMH includes: HFpEF, aortic atherosclerosis, bilateral common iliac artery stenosis, HTN, HLD, hypothyroidism, COPD, LEFT pulmonary nodule, GERD (on daily H2 blocker), anemia, polymyalgia rheumatica, anxiety.  Patient is followed by cardiology (End, MD). She was last seen in the cardiology clinic on 11/01/2021; notes reviewed.  At the time of her clinic visit, patient doing well overall from a cardiovascular perspective.  She denied any episodes of chest pain.  Patient with chronic exertional dyspnea at baseline due to her underlying COPD  diagnosis.  No PND, orthopnea, palpitations, significant peripheral edema, vertiginous symptoms, or presyncope/syncope.  PMH significant for cardiovascular diagnoses.  Last TTE performed on 02/16/2018 revealed normal left ventricular systolic function with an EF of 60 to 65%.  Myocardial perfusion imaging study performed on 11/26/2018 revealed no evidence of significant ischemia.  LVEF normal at 56%.  There was no significant coronary artery calcification, however aortic atherosclerosis noted on the attenuation correction CT.  There was a small perfusion abnormality likely consistent with artifact, however subtle scar cannot be ruled out (see full interpretation of cardiovascular test below).  Blood pressure mildly elevated at 142/73 on currently prescribed CCB (amlodipine) and ARB (losartan) therapies.  Patient is not currently taking any type of lipid-lowering therapies for her HLD diagnosis and ASCVD prevention.  She is not diabetic. Patient does not have an OSAH diagnosis. Functional capacity, as defined by DASI, is documented as being >/= 4 METS.  No changes were made to her medication regimen.  Patient follow-up with outpatient cardiology in 1 year or sooner if needed.  Recently found to have a 1.5 cm medial LEFT upper lobe pulmonary nodule on CT imaging of the chest performed on 10/30/2021.  Subsequent PET on 11/21/2021 demonstrated that nodule was FDG avid/hypermetabolic with an SUV max of 9.3.  Findings concerning for primary bronchogenic carcinoma.  Patient has been scheduled with pulmonary medicine for discussion regarding tissue sampling for definitive diagnosis.  Patient is scheduled for A ROBOTIC ASSISTED NAVIGATIONAL BRONCHOSCOPY; ENDOBRONCHIAL ULTRASOUND on 01/17/2022 with Dr. Vernard Gambles, MD.  Given patient's past medical history significant for cardiovascular diagnoses, previous operative cardiovascular clearance was sought by the PAT team.  Per cardiology, "Ms. Maziarz's perioperative  risk of a major cardiac event is 6.6% according to the RCRI. Therefore, she  is at high risk for perioperative complications. Her functional capacity is good at 5.07 METs according to the DASI. According to ACC/AHA guidelines, no further cardiovascular testing needed.  The patient may proceed to surgery at acceptable risk". In review of her medication reconciliation, it is noted the patient is not currently taking any type of anticoagulation or antiplatelet therapies that would need to be held during her perioperative course.  Patient reports previous perioperative complications with anesthesia in the past. Patient has a PMH (+) for PONV. Symptoms and history of PONV will be discussed with patient by anesthesia team on the day of her procedure. Interventions will be ordered as deemed necessary based on patient's individual care needs as determined by anesthesiologist. In review of the available records, it is noted that patient underwent a general anesthetic course here at [facility] ([facility] in 06/2020 without documented complications.      01/08/2022   12:36 PM 01/02/2022    2:43 PM 12/22/2021   11:14 AM  Vitals with BMI  Height 4\' 9"  4\' 9"  4\' 9"   Weight 186 lbs 187 lbs 3 oz 188 lbs  BMI 40.24 58.0 99.83  Systolic  382 505  Diastolic  82 79  Pulse  397 95    Providers/Specialists:   NOTE: Primary physician provider listed below. Patient may have been seen by APP or partner within same practice.   PROVIDER ROLE / SPECIALTY LAST OV  Tyler Pita, MD Pulmonary Medicine (Surgeon) 01/02/2022  Pleas Koch, NP Primary Care Provider 10/12/2021  Nelva Bush, MD Cardiology 11/01/2021   Allergies:  Epinephrine, Food, Mangifera indica, Other, Prednisone, Milk-related compounds, Pneumococcal 13-val conj vacc, and Prevnar 13 [pneumococcal 13-val conj vacc]  Current Home Medications:   No current facility-administered medications for this encounter.     amLODipine (NORVASC) 5 MG tablet   Calcium Carb-Cholecalciferol (CALCIUM + VITAMIN D3 PO)   famotidine (PEPCID) 20 MG tablet   Glycopyrrolate-Formoterol (BEVESPI AEROSPHERE) 9-4.8 MCG/ACT AERO   hydroxychloroquine (PLAQUENIL) 200 MG tablet   levothyroxine (SYNTHROID) 75 MCG tablet   losartan (COZAAR) 100 MG tablet   Multiple Vitamins-Minerals (PRESERVISION AREDS 2 PO)   tacrolimus (PROTOPIC) 0.1 % ointment   History:   Past Medical History:  Diagnosis Date   (HFpEF) heart failure with preserved ejection fraction ([place]) 06/28/2011   a.) TTE 06/28/2011: EF 55-60%, mild AR, G1DD; b.) TTE 01/16/2013: EF 55-60%, mild MR; c.) TTE 02/16/2018: EF 60-65%, G1DD; d.) MPI 11/26/2018: EF 56%   Adenoma of left adrenal gland    Anemia    Anxiety    Aortic atherosclerosis (HCC)    Bilateral carpal tunnel syndrome    CAD (coronary artery disease)    a.) HR CT chest 10/30/2021: 2 vessel CAD (LAD/RCA)   CAP (community acquired pneumonia)    COPD (chronic obstructive pulmonary disease) ([place])    Diverticulosis    Dyspnea    DUE TO HEAT WITH COPD   Epistaxis 01/09/2017   GERD (gastroesophageal reflux disease)    Hypertension    Hypothyroidism    Iliac artery stenosis, bilateral (HCC)    Morbid obesity with BMI of 40.0-44.9, adult ([place])    New onset of headaches    Osteopenia    PMR (polymyalgia rheumatica) (HCC)    Pneumonia 02/13/2018   PONV (postoperative nausea and vomiting)    Pulmonary nodule, left    a.) CT chest 10/30/2021: 1.5 cm medial LUL nodule; b.) PET CT 67/34/1937: hypermetabolic LUL nodule (SUX  max 9.3)   Shingles    Stasis dermatitis    Strain of chest wall 01/20/2011   Tobacco abuse    Vitamin D deficiency    Past Surgical History:  Procedure Laterality Date   ANTERIOR CERVICAL DECOMP/DISCECTOMY FUSION  06/22/2004   C4-C6   APPENDECTOMY     CARPAL TUNNEL RELEASE Left 08/31/2019   Procedure: CARPAL TUNNEL RELEASE;  Surgeon: Dereck Leep, MD;  Location: [facility] ORS;   Service: Orthopedics;  Laterality: Left;   CARPAL TUNNEL RELEASE Right 06/22/2020   Procedure: CARPAL TUNNEL RELEASE;  Surgeon: Dereck Leep, MD;  Location: [facility] ORS;  Service: Orthopedics;  Laterality: Right;   CHOLECYSTECTOMY     COLONOSCOPY     ESOPHAGOGASTRODUODENOSCOPY     HEMORRHOID SURGERY     TOTAL ABDOMINAL HYSTERECTOMY W/ BILATERAL SALPINGOOPHORECTOMY     TUBAL LIGATION     Family History  Problem Relation Age of Onset   Heart attack Mother 30   Cancer Father        lung/chest wall    Prostate cancer Father    Diabetes Brother        siblings   Hypertension Sister        x 2   Heart disease Brother    Breast cancer Paternal Grandmother    Heart disease Sister    Diabetes Sister    Tuberculosis Sister    Cirrhosis Brother    Social History   Tobacco Use   Smoking status: Former    Packs/day: 1.50    Years: 20.00    Total pack years: 30.00    Types: Cigarettes    Quit date: 02/11/2009    Years since quitting: 12.9   Smokeless tobacco: Never  Vaping Use   Vaping Use: Never used  Substance Use Topics   Alcohol use: No    Alcohol/week: 0.0 standard drinks of alcohol   Drug use: No    Pertinent Clinical Results:  LABS: Labs reviewed: Acceptable for surgery.  No visits with results within 3 Day(s) from this visit.  Latest known visit with results is:  Telephone on 12/05/2021  Component Date Value Ref Range Status   Glucose 12/27/2021 110 (H)  70 - 99 mg/dL Final   BUN 12/27/2021 16  8 - 27 mg/dL Final   Creatinine, Ser 12/27/2021 0.90  0.57 - 1.00 mg/dL Final   eGFR 12/27/2021 66  >59 mL/min/1.73 Final   BUN/Creatinine Ratio 12/27/2021 18  12 - 28 Final   Sodium 12/27/2021 142  134 - 144 mmol/L Final   Potassium 12/27/2021 4.4  3.5 - 5.2 mmol/L Final   Chloride 12/27/2021 103  96 - 106 mmol/L Final   CO2 12/27/2021 23  20 - 29 mmol/L Final   Calcium 12/27/2021 9.4  8.7 - 10.3 mg/dL Final   Total Protein 12/27/2021 7.1  6.0 - 8.5 g/dL Final    Albumin 12/27/2021 4.1  3.8 - 4.8 g/dL Final   Globulin, Total 12/27/2021 3.0  1.5 - 4.5 g/dL Final   Albumin/Globulin Ratio 12/27/2021 1.4  1.2 - 2.2 Final   Bilirubin Total 12/27/2021 0.3  0.0 - 1.2 mg/dL Final   Alkaline Phosphatase 12/27/2021 76  44 - 121 IU/L Final   AST 12/27/2021 17  0 - 40 IU/L Final   ALT 12/27/2021 12  0 - 32 IU/L Final    ECG: Date: 01/10/2022 Time ECG obtained: 0909 AM Rate: 94 bpm Rhythm: normal sinus Axis (leads I and aVF): Normal Intervals: PR  168 ms. QRS 90 ms. QTc 462 ms. ST segment and T wave changes: Nonspecific ST and T wave abnormalities  Comparison: Similar to previous tracing obtained on 02/25/2020   IMAGING / PROCEDURES: NM PET IMAGE INITIAL (PI) SKULL BASE TO THIGH performed on 25/36/6440 Hypermetabolic 13 mm left upper lobe pulmonary nodule, compatible with primary bronchogenic neoplasm. Mildly metabolic precarinal lymph node, is nonspecific but given its preserved fatty hilum is favored reactive. No hypermetabolic left hilar lymph nodes No evidence of hypermetabolic distant metastatic disease. Colonic diverticulosis with a short segment of hypermetabolic sigmoid colonic bowel activity, which is nonspecific and may reflect sequela of chronic diverticulitis/ segmental colitis associated with diverticulosis . However, if not recently performed consider further evaluation with colonoscopy to exclude a discrete mass lesion Aortic atherosclerosis  CT CHEST HIGH RESOLUTION performed on 10/30/2021 Slightly spiculated solid 1.5 cm medial left upper lobe pulmonary nodule, suspicious for primary bronchogenic carcinoma. Recommend further characterization with PET-CT and multidisciplinary thoracic oncology consultation. No thoracic adenopathy. Moderate patchy air trapping in both lungs, indicative of small airways disease. Scattered thin parenchymal bands and mild patchy subpleural reticulation throughout both lungs without bronchiectasis or  honeycombing. Findings are most compatible with nonspecific mild bland postinfectious/postinflammatory scarring. No compelling findings of interstitial lung disease. Mild smooth bilateral pleural thickening without pleural effusions. Two-vessel coronary atherosclerosis.  Left adrenal adenoma, for which no follow-up imaging is recommended. Aortic atherosclerosis  Emphysema  PULMONARY FUNCTION TESTING performed on 08/30/2021    Latest Ref Rng & Units 08/30/2021    2:01 PM  PFT Results  FVC-Pre L 1.41   FVC-Predicted Pre % 71   FVC-Post L 1.55   FVC-Predicted Post % 79   Pre FEV1/FVC % % 67   Post FEV1/FCV % % 63   FEV1-Pre L 0.95   FEV1-Predicted Pre % 65   FEV1-Post L 0.99   DLCO uncorrected ml/min/mmHg 7.69   DLCO UNC% % 51   DLVA Predicted % 56   TLC L 4.34   TLC % Predicted % 108   RV % Predicted % 139    MYOCARDIAL PERFUSION IMAGING STUDY (LEXISCAN) performed on 11/26/2018 Small in size, mild in severity, fixed defect involving the apical anterior and apical segments.  This is most consistent with artifact (attenuation and apical thinning), but cannot rule out subtle scar. There is no significant ischemia The left ventricular ejection fraction is normal at 56% There is no significant coronary artery calcification Aortic atherosclerosis is noted on the attenuation correction CT This is a low risk study  TRANSTHORACIC ECHOCARDIOGRAM performed on 02/16/2018 Left ventricular systolic function normal with an ejection fraction of 60 to 65% The left ventricular cavity size is normal Left ventricular Doppler parameters consistent with impaired relaxation right ventricular systolic function normal right ventricular cavity size normal with no increased wall thickness Right atrial pressure is estimated at 10 mmHg Unable to estimate RVSP Mild mitral valve regurgitation No evidence of valvular stenosis  CAROTID DOPPLER performed on 11/01/2017 Right Carotid:  Velocities in the  right ICA are consistent with a 1-39% stenosis.  Non-hemodynamically significant plaque <50% noted in the CCA.  The  ECA appears <50% stenosed.  Stable velocities compared to previous exam.  Left Carotid:  Velocities in the left ICA are consistent with a 1-39% stenosis.  Non-hemodynamically significant plaque noted in the CCA.  The ECA appears <50% stenosed.  Stable velocities compared to previous exam.  Vertebrals:   Bilateral vertebral arteries demonstrate antegrade flow.  Subclavians:  Normal flow hemodynamics  were seen in bilateral subclavian arteries.   Impression and Plan:  Lyzette Reinhardt Herford has been referred for pre-anesthesia review and clearance prior to her undergoing the planned anesthetic and procedural courses. Available labs, pertinent testing, and imaging results were personally reviewed by me. This patient has been appropriately cleared by cardiology with an overall ACCEPTABLE risk of significant perioperative cardiovascular complications.  Based on clinical review performed today (01/10/22), barring any significant acute changes in the patient's overall condition, it is anticipated that she will be able to proceed with the planned surgical intervention. Any acute changes in clinical condition may necessitate her procedure being postponed and/or cancelled. Patient will meet with anesthesia team (MD and/or CRNA) on the day of her procedure for preoperative evaluation/assessment. Questions regarding anesthetic course will be fielded at that time.   Pre-surgical instructions were reviewed with the patient during her PAT appointment and questions were fielded by PAT clinical staff. Patient was advised that if any questions or concerns arise prior to her procedure then she should return a call to PAT and/or her surgeon's office to discuss.  Honor Loh, MSN, APRN, FNP-C, CEN [facility]  Peri-operative Services Nurse Practitioner Phone: [phone] Fax: [phone] 01/10/22 11:50 AM  NOTE: This note has been prepared using Dragon dictation software. Despite my best ability to proofread, there is always the potential that unintentional transcriptional errors may still occur from this process.

## 2022-01-09 ENCOUNTER — Telehealth: Payer: Self-pay | Admitting: *Deleted

## 2022-01-09 NOTE — Telephone Encounter (Signed)
Patient Scheduled 01-10-22 Med Rec and Consent done

## 2022-01-09 NOTE — Telephone Encounter (Signed)
  Patient Consent for Virtual Visit   Patient Scheduled 01-10-22 Med Rec and Consent done  TIP  Anything in RED or BLUE will delete when you sign your note. There is no need to delete it or select it by pressing F2.   Please read everything in BLUE to the patient.        :[id number]}     Teresa Jensen has provided verbal consent on 01/09/2022 for a virtual visit (video or telephone).   CONSENT FOR VIRTUAL VISIT FOR:  Teresa Jensen  By participating in this virtual visit I agree to the following:  I hereby voluntarily request, consent and authorize [facility] and its employed or contracted physicians, physician assistants, nurse practitioners or other licensed health care professionals (the Practitioner), to provide me with telemedicine health care services (the "Services") as deemed necessary by the treating Practitioner. I acknowledge and consent to receive the Services by the Practitioner via telemedicine. I understand that the telemedicine visit will involve communicating with the Practitioner through live audiovisual communication technology and the disclosure of certain medical information by electronic transmission. I acknowledge that I have been given the opportunity to request an in-person assessment or other available alternative prior to the telemedicine visit and am voluntarily participating in the telemedicine visit.  I understand that I have the right to withhold or withdraw my consent to the use of telemedicine in the course of my care at any time, without affecting my right to future care or treatment, and that the Practitioner or I may terminate the telemedicine visit at any time. I understand that I have the right to inspect all information obtained and/or recorded in the course of the telemedicine visit and may receive copies of available information for a reasonable fee.  I understand that some of the potential risks of receiving the Services via telemedicine  include:  Delay or interruption in medical evaluation due to technological equipment failure or disruption; Information transmitted may not be sufficient (e.g. poor resolution of images) to allow for appropriate medical decision making by the Practitioner; and/or  In rare instances, security protocols could fail, causing a breach of personal health information.  Furthermore, I acknowledge that it is my responsibility to provide information about my medical history, conditions and care that is complete and accurate to the best of my ability. I acknowledge that Practitioner's advice, recommendations, and/or decision may be based on factors not within their control, such as incomplete or inaccurate data provided by me or distortions of diagnostic images or specimens that may result from electronic transmissions. I understand that the practice of medicine is not an exact science and that Practitioner makes no warranties or guarantees regarding treatment outcomes. I acknowledge that a copy of this consent can be made available to me via my patient portal ([facility]), or I can request a printed copy by calling the office of [facility].    I understand that my insurance will be billed for this visit.   I have read or had this consent read to me. I understand the contents of this consent, which adequately explains the benefits and risks of the Services being provided via telemedicine.  I have been provided ample opportunity to ask questions regarding this consent and the Services and have had my questions answered to my satisfaction. I give my informed consent for the services to be provided through the use of telemedicine in my medical care

## 2022-01-10 ENCOUNTER — Ambulatory Visit: Payer: Medicare Other | Attending: Internal Medicine | Admitting: Physician Assistant

## 2022-01-10 DIAGNOSIS — Z01810 Encounter for preprocedural cardiovascular examination: Secondary | ICD-10-CM

## 2022-01-10 NOTE — Progress Notes (Signed)
Virtual Visit via Telephone Note   Because of Teresa Jensen's co-morbid illnesses, she is at least at moderate risk for complications without adequate follow up.  This format is felt to be most appropriate for this patient at this time.  The patient did not have access to video technology/had technical difficulties with video requiring transitioning to audio format only (telephone).  All issues noted in this document were discussed and addressed.  No physical exam could be performed with this format.  Please refer to the patient's chart for her consent to telehealth for [facility].  Evaluation Performed:  Preoperative cardiovascular risk assessment _____________   Date:  01/10/2022   Patient ID:  Teresa Jensen, DOB 05-06-1945, MRN [id number] Patient Location:  Home Provider location:   Office  Primary Care Provider:  Pleas Koch, NP Primary Cardiologist:  Nelva Bush, MD  Chief Complaint / Patient Profile   77 y.o. y/o female with a h/o HFpEF, HTN, COPD, polymyalgia rheumatica, and GERD who is pending EBUS and presents today for telephonic preoperative cardiovascular risk assessment.  History of Present Illness    Teresa Jensen is a 77 y.o. female who presents via audio/video conferencing for a telehealth visit today.  Pt was last seen in cardiology clinic on 11/01/2021 by Cadence Kathlen Mody, PA.  At that time Teresa Jensen was doing well other than mildly elevated BP.  The patient is now pending procedure as outlined above. Since her last visit, she has been feeling okay and has not had any issues with chest pain.  She is using her inhaler twice a day which is helping with some shortness of breath.  She is able to do all indoor tasks but has help with her yard.  For this reason, she has scored a 5.07 METS on the DASI.  This exceeds the minimum 4 METS requirement.   No medications indicated as needing held.   Past Medical History    Past Medical History:   Diagnosis Date   (HFpEF) heart failure with preserved ejection fraction (Teresa Jensen) 06/28/2011   a.) TTE 06/28/2011: EF 55-60%, mild AR, G1DD; b.) TTE 01/16/2013: EF 55-60%, mild MR; c.) TTE 02/16/2018: EF 60-65%, G1DD; d.) MPI 11/26/2018: EF 56%   Adenoma of left adrenal gland    Anemia    Anxiety    Aortic atherosclerosis (HCC)    Bilateral carpal tunnel syndrome    CAD (coronary artery disease)    a.) HR CT chest 10/30/2021: 2 vessel CAD (LAD/RCA)   CAP (community acquired pneumonia)    COPD (chronic obstructive pulmonary disease) ([place])    Diverticulosis    Dyspnea    DUE TO HEAT WITH COPD   Epistaxis 01/09/2017   GERD (gastroesophageal reflux disease)    Hypertension    Hypothyroidism    Iliac artery stenosis, bilateral (HCC)    Morbid obesity with BMI of 40.0-44.9, adult ([place])    New onset of headaches    Osteopenia    PMR (polymyalgia rheumatica) (HCC)    Pneumonia 02/13/2018   PONV (postoperative nausea and vomiting)    Pulmonary nodule, left    a.) CT chest 10/30/2021: 1.5 cm medial LUL nodule; b.) PET CT 05/69/7948: hypermetabolic LUL nodule (SUX max 9.3)   Shingles    Stasis dermatitis    Strain of chest wall 01/20/2011   Tobacco abuse    Vitamin D deficiency    Past Surgical History:  Procedure Laterality Date   ANTERIOR CERVICAL DECOMP/DISCECTOMY FUSION  06/22/2004   C4-C6   APPENDECTOMY     CARPAL TUNNEL RELEASE Left 08/31/2019   Procedure: CARPAL TUNNEL RELEASE;  Surgeon: Dereck Leep, MD;  Location: [facility] ORS;  Service: Orthopedics;  Laterality: Left;   CARPAL TUNNEL RELEASE Right 06/22/2020   Procedure: CARPAL TUNNEL RELEASE;  Surgeon: Dereck Leep, MD;  Location: [facility] ORS;  Service: Orthopedics;  Laterality: Right;   CHOLECYSTECTOMY     COLONOSCOPY     ESOPHAGOGASTRODUODENOSCOPY     HEMORRHOID SURGERY     TOTAL ABDOMINAL HYSTERECTOMY W/ BILATERAL SALPINGOOPHORECTOMY     TUBAL LIGATION      Allergies  Allergies  Allergen Reactions   Epinephrine  Anaphylaxis and Shortness Of Breath   Food Rash    MANGO = cause rashes around the mouth LIPS GO NUMB   M[employer] Rash    THIS IS MANGO LIPS GO NUMB   Other Rash    MANGO = cause rashes around the mouth MANGO = cause rashes around the mouth LIPS GO NUMB   Prednisone Anaphylaxis, Hives, Shortness Of Breath, Itching and Swelling    The face swells HAS TAKEN CORTISONE SHOTS WITHOUT ISSUE BEFORE   Milk-Related Compounds Diarrhea    DAIRY PRODUCTS ARE A PROBLEM   Pneumococcal 13-Val Conj Vacc Itching and Other (See Comments)    Arm temp elevated at site of injection, red, itching   Prevnar 13 [Pneumococcal 13-Val Conj Vacc] Other (See Comments)    Arm temp elevated at site of injection, red, itching    Home Medications    Prior to Admission medications   Medication Sig Start Date End Date Taking? Authorizing Provider  amLODipine (NORVASC) 5 MG tablet TAKE 1 TABLET BY MOUTH EVERY DAY FOR BLOOD PRESSURE 06/04/21   Pleas Koch, NP  Calcium Carb-Cholecalciferol (CALCIUM + VITAMIN D3 PO) Take 1,200 mg by mouth daily.    [provider]  famotidine (PEPCID) 20 MG tablet One after supper 09/07/21   Tanda Rockers, MD  Glycopyrrolate-Formoterol (BEVESPI AEROSPHERE) 9-4.8 MCG/ACT AERO Inhale 2 puffs into the lungs 2 (two) times daily. 10/19/21   Tanda Rockers, MD  hydroxychloroquine (PLAQUENIL) 200 MG tablet TAKE 1 TABLET BY MOUTH TWICE A DAY 11/27/21   Moye, [place], MD  levothyroxine (SYNTHROID) 75 MCG tablet TAKE 1 TABLET BY MOUTH EVERY MORNING ON AN EMPTY STOMACH WITH WATER ONLY. NO FOOD OR OTHER MEDICATIONS FOR 30 MINUTES. 02/28/21   Pleas Koch, NP  losartan (COZAAR) 100 MG tablet TAKE 1 TABLET BY MOUTH EVERY DAY 11/27/21   End, Harrell Gave, MD  Multiple Vitamins-Minerals (PRESERVISION AREDS 2 PO) Take 1 tablet by mouth in the morning and at bedtime.    [provider]  tacrolimus (PROTOPIC) 0.1 % ointment Apply to aa's lower legs BID PRN. 12/06/20    Alfonso Patten, MD    Physical Exam    Vital Signs:  Teresa Jensen does not have vital signs available for review today.  Given telephonic nature of communication, physical exam is limited. AAOx3. NAD. Normal affect.  Speech and respirations are unlabored.  Accessory Clinical Findings    None  Assessment & Plan    1.  Preoperative Cardiovascular Risk Assessment:  Teresa Jensen's perioperative risk of a major cardiac event is 6.6% according to the Revised Cardiac Risk Index (RCRI).  Therefore, she is at high risk for perioperative complications.   Her functional capacity is good at 5.07 METs according to the [place] Activity Status Index (DASI). Recommendations: According to ACC/AHA  guidelines, no further cardiovascular testing needed.  The patient may proceed to surgery at acceptable risk.     The patient was advised that if she develops new symptoms prior to surgery to contact our office to arrange for a follow-up visit, and she verbalized understanding.   A copy of this note will be routed to requesting surgeon.  Time:   Today, I have spent 7 minutes with the patient with telehealth technology discussing medical history, symptoms, and management plan.     Elgie Collard, PA-C  01/10/2022, 8:16 AM

## 2022-01-12 ENCOUNTER — Ambulatory Visit
Admission: RE | Admit: 2022-01-12 | Discharge: 2022-01-12 | Disposition: A | Discharge status: Home / Self Care | Payer: Medicare Other | Source: Ambulatory Visit | Attending: Pulmonary Disease | Admitting: Pulmonary Disease

## 2022-01-12 DIAGNOSIS — R911 Solitary pulmonary nodule: Secondary | ICD-10-CM | POA: Diagnosis present

## 2022-01-15 ENCOUNTER — Other Ambulatory Visit: Payer: Medicare Other

## 2022-01-15 ENCOUNTER — Encounter
Admission: RE | Admit: 2022-01-15 | Discharge: 2022-01-15 | Disposition: A | Discharge status: Home / Self Care | Payer: Medicare Other | Source: Ambulatory Visit | Attending: Pulmonary Disease | Admitting: Pulmonary Disease

## 2022-01-15 ENCOUNTER — Encounter: Payer: Self-pay | Admitting: Urgent Care

## 2022-01-15 ENCOUNTER — Encounter: Payer: Self-pay | Admitting: Pulmonary Disease

## 2022-01-15 DIAGNOSIS — Z1152 Encounter for screening for COVID-19: Secondary | ICD-10-CM | POA: Diagnosis not present

## 2022-01-15 DIAGNOSIS — I5032 Chronic diastolic (congestive) heart failure: Secondary | ICD-10-CM | POA: Insufficient documentation

## 2022-01-15 DIAGNOSIS — J441 Chronic obstructive pulmonary disease with (acute) exacerbation: Secondary | ICD-10-CM | POA: Insufficient documentation

## 2022-01-15 DIAGNOSIS — Z01812 Encounter for preprocedural laboratory examination: Secondary | ICD-10-CM | POA: Insufficient documentation

## 2022-01-15 LAB — CBC
HCT: 41.2 % (ref 36.0–46.0)
Hemoglobin: 12.9 g/dL (ref 12.0–15.0)
MCH: 27.7 pg (ref 26.0–34.0)
MCHC: 31.3 g/dL (ref 30.0–36.0)
MCV: 88.6 fL (ref 80.0–100.0)
Platelets: 345 10*3/uL (ref 150–400)
RBC: 4.65 MIL/uL (ref 3.87–5.11)
RDW: 15.1 % (ref 11.5–15.5)
WBC: 10.1 10*3/uL (ref 4.0–10.5)
nRBC: 0 % (ref 0.0–0.2)

## 2022-01-16 LAB — SARS CORONAVIRUS 2 (TAT 6-24 HRS): SARS Coronavirus 2: NEGATIVE

## 2022-01-16 MED ORDER — ORAL CARE MOUTH RINSE: 15.0000 mL | Freq: Once | OROMUCOSAL | Status: AC

## 2022-01-16 MED ORDER — CHLORHEXIDINE GLUCONATE 0.12 % MT SOLN: 15.0000 mL | Freq: Once | OROMUCOSAL | Status: AC

## 2022-01-16 MED ORDER — IPRATROPIUM-ALBUTEROL 0.5-2.5 (3) MG/3ML IN SOLN: 3.0000 mL | Freq: Once | RESPIRATORY_TRACT | Status: AC

## 2022-01-16 MED ORDER — LACTATED RINGERS IV SOLN: INTRAVENOUS | Status: DC

## 2022-01-16 MED ORDER — SODIUM CHLORIDE 0.9 % IV SOLN: Freq: Once | INTRAVENOUS | Status: AC

## 2022-01-17 ENCOUNTER — Other Ambulatory Visit: Payer: Self-pay

## 2022-01-17 ENCOUNTER — Ambulatory Visit: Payer: Medicare Other | Admitting: Urgent Care

## 2022-01-17 ENCOUNTER — Encounter
Admission: RE | Disposition: A | Discharge status: Home / Self Care | Payer: Self-pay | Source: Home / Self Care | Attending: Pulmonary Disease

## 2022-01-17 ENCOUNTER — Encounter: Payer: Self-pay | Admitting: Pulmonary Disease

## 2022-01-17 ENCOUNTER — Ambulatory Visit: Payer: Medicare Other

## 2022-01-17 ENCOUNTER — Ambulatory Visit
Admission: RE | Admit: 2022-01-17 | Discharge: 2022-01-17 | Disposition: A | Discharge status: Home / Self Care | Payer: Medicare Other | Attending: Pulmonary Disease | Admitting: Pulmonary Disease

## 2022-01-17 DIAGNOSIS — K219 Gastro-esophageal reflux disease without esophagitis: Secondary | ICD-10-CM | POA: Insufficient documentation

## 2022-01-17 DIAGNOSIS — R911 Solitary pulmonary nodule: Secondary | ICD-10-CM

## 2022-01-17 DIAGNOSIS — Z87891 Personal history of nicotine dependence: Secondary | ICD-10-CM | POA: Insufficient documentation

## 2022-01-17 DIAGNOSIS — C3412 Malignant neoplasm of upper lobe, left bronchus or lung: Secondary | ICD-10-CM | POA: Insufficient documentation

## 2022-01-17 DIAGNOSIS — I5032 Chronic diastolic (congestive) heart failure: Secondary | ICD-10-CM | POA: Diagnosis not present

## 2022-01-17 DIAGNOSIS — J8410 Pulmonary fibrosis, unspecified: Secondary | ICD-10-CM | POA: Diagnosis not present

## 2022-01-17 DIAGNOSIS — J449 Chronic obstructive pulmonary disease, unspecified: Secondary | ICD-10-CM | POA: Insufficient documentation

## 2022-01-17 DIAGNOSIS — I110 Hypertensive heart disease with heart failure: Secondary | ICD-10-CM | POA: Diagnosis not present

## 2022-01-17 DIAGNOSIS — Z6841 Body Mass Index (BMI) 40.0 and over, adult: Secondary | ICD-10-CM | POA: Insufficient documentation

## 2022-01-17 DIAGNOSIS — I739 Peripheral vascular disease, unspecified: Secondary | ICD-10-CM | POA: Insufficient documentation

## 2022-01-17 DIAGNOSIS — R590 Localized enlarged lymph nodes: Secondary | ICD-10-CM

## 2022-01-17 HISTORY — DX: Benign neoplasm of left adrenal gland: D35.02

## 2022-01-17 HISTORY — DX: Atherosclerotic heart disease of native coronary artery without angina pectoris: I25.10

## 2022-01-17 HISTORY — DX: Diverticulosis of intestine, part unspecified, without perforation or abscess without bleeding: K57.90

## 2022-01-17 HISTORY — PX: ENDOBRONCHIAL ULTRASOUND: SHX5096

## 2022-01-17 SURGERY — BRONCHOSCOPY, WITH BIOPSY USING ELECTROMAGNETIC NAVIGATION
Anesthesia: General | Laterality: Left

## 2022-01-17 MED ORDER — DIPHENHYDRAMINE HCL 50 MG/ML IJ SOLN
INTRAMUSCULAR | Status: DC | PRN
  Administered 2022-01-17: 25 mg via INTRAVENOUS

## 2022-01-17 MED ORDER — DEXMEDETOMIDINE HCL IN NACL 200 MCG/50ML IV SOLN
INTRAVENOUS | Status: DC | PRN
  Administered 2022-01-17: 8 ug via INTRAVENOUS

## 2022-01-17 MED ORDER — LIDOCAINE HCL (CARDIAC) PF 100 MG/5ML IV SOSY
PREFILLED_SYRINGE | INTRAVENOUS | Status: DC | PRN
  Administered 2022-01-17: 100 mg via INTRAVENOUS

## 2022-01-17 MED ORDER — CHLORHEXIDINE GLUCONATE 0.12 % MT SOLN
OROMUCOSAL | Status: AC
  Administered 2022-01-17: 15 mL via OROMUCOSAL
  Filled 2022-01-17: qty 15

## 2022-01-17 MED ORDER — IPRATROPIUM-ALBUTEROL 0.5-2.5 (3) MG/3ML IN SOLN
RESPIRATORY_TRACT | Status: AC
  Administered 2022-01-17: 3 mL via RESPIRATORY_TRACT
  Filled 2022-01-17: qty 3

## 2022-01-17 MED ORDER — ONDANSETRON HCL 4 MG/2ML IJ SOLN
INTRAMUSCULAR | Status: AC
  Filled 2022-01-17: qty 2

## 2022-01-17 MED ORDER — ONDANSETRON HCL 4 MG/2ML IJ SOLN
4.0000 mg | Freq: Once | INTRAMUSCULAR | Status: AC
  Administered 2022-01-17: 4 mg via INTRAVENOUS

## 2022-01-17 MED ORDER — PHENYLEPHRINE HCL (PRESSORS) 10 MG/ML IV SOLN
INTRAVENOUS | Status: DC | PRN
  Administered 2022-01-17: 80 ug via INTRAVENOUS

## 2022-01-17 MED ORDER — PROPOFOL 500 MG/50ML IV EMUL
INTRAVENOUS | Status: DC | PRN
  Administered 2022-01-17: 75 ug/kg/min via INTRAVENOUS

## 2022-01-17 MED ORDER — MENTHOL 3 MG MT LOZG
1.0000 | LOZENGE | OROMUCOSAL | Status: DC | PRN
  Administered 2022-01-17: 3 mg via ORAL
  Filled 2022-01-17: qty 9

## 2022-01-17 MED ORDER — ONDANSETRON HCL 4 MG/2ML IJ SOLN
INTRAMUSCULAR | Status: DC | PRN
  Administered 2022-01-17: 4 mg via INTRAVENOUS

## 2022-01-17 MED ORDER — DEXAMETHASONE SODIUM PHOSPHATE 10 MG/ML IJ SOLN
INTRAMUSCULAR | Status: DC | PRN
  Administered 2022-01-17: 5 mg via INTRAVENOUS

## 2022-01-17 MED ORDER — DIPHENHYDRAMINE HCL 50 MG/ML IJ SOLN
INTRAMUSCULAR | Status: AC
  Filled 2022-01-17: qty 1

## 2022-01-17 MED ORDER — ACETAMINOPHEN 10 MG/ML IV SOLN: 1000.0000 mg | Freq: Once | INTRAVENOUS | Status: AC

## 2022-01-17 MED ORDER — SUGAMMADEX SODIUM 200 MG/2ML IV SOLN
INTRAVENOUS | Status: DC | PRN
  Administered 2022-01-17: 200 mg via INTRAVENOUS

## 2022-01-17 MED ORDER — PROPOFOL 10 MG/ML IV BOLUS
INTRAVENOUS | Status: AC
  Filled 2022-01-17: qty 20

## 2022-01-17 MED ORDER — PROPOFOL 1000 MG/100ML IV EMUL
INTRAVENOUS | Status: AC
  Filled 2022-01-17: qty 100

## 2022-01-17 MED ORDER — DEXAMETHASONE SODIUM PHOSPHATE 10 MG/ML IJ SOLN
INTRAMUSCULAR | Status: AC
  Filled 2022-01-17: qty 1

## 2022-01-17 MED ORDER — FENTANYL CITRATE (PF) 100 MCG/2ML IJ SOLN
INTRAMUSCULAR | Status: AC
  Filled 2022-01-17: qty 2

## 2022-01-17 MED ORDER — ACETAMINOPHEN 10 MG/ML IV SOLN
INTRAVENOUS | Status: AC
  Administered 2022-01-17: 1000 mg via INTRAVENOUS
  Filled 2022-01-17: qty 100

## 2022-01-17 MED ORDER — ROCURONIUM BROMIDE 100 MG/10ML IV SOLN
INTRAVENOUS | Status: DC | PRN
  Administered 2022-01-17: 10 mg via INTRAVENOUS
  Administered 2022-01-17: 40 mg via INTRAVENOUS

## 2022-01-17 MED ORDER — ROCURONIUM BROMIDE 10 MG/ML (PF) SYRINGE
PREFILLED_SYRINGE | INTRAVENOUS | Status: AC
  Filled 2022-01-17: qty 10

## 2022-01-17 MED ORDER — PROPOFOL 10 MG/ML IV BOLUS
INTRAVENOUS | Status: DC | PRN
  Administered 2022-01-17: 100 mg via INTRAVENOUS

## 2022-01-17 MED ORDER — FENTANYL CITRATE (PF) 100 MCG/2ML IJ SOLN
INTRAMUSCULAR | Status: DC | PRN
  Administered 2022-01-17 (×2): 50 ug via INTRAVENOUS

## 2022-01-17 NOTE — Progress Notes (Signed)
Patient in recliner, tolerating gingerale and crackers. Remains extremely anxious, anxiety increased when patient expelled mucus with blood tinged. Dr. Jayme Cloud spoke to patient at length regarding. [name] verbalizes understanding.

## 2022-01-17 NOTE — Op Note (Signed)
PROCEDURES:  Robotic assisted bronchoscopy with biopsies Cellvizio probe based confocal laser endomicroscopy (pCLE) Augmented fluoroscopy Endobronchial ultrasound with TBNA   Indication: Left upper lobe 1.5 cm nodule, FDG avid, highly suspicious for malignancy, subcarinal adenopathy.  Preoperative Diagnosis: Left upper lobe nodule, subcarinal adenopathy, rule out CA Post Procedure Diagnosis: Same as above Consent: Verbal/Written: obtained  Benefits, limitations and potential complications of the procedure were discussed with the patient/family.  Complications from bronchoscopy are rare and most often minor, but if they occur they may include breathing difficulty, vocal cord spasm, hoarseness, slight fever, vomiting, dizziness, bronchospasm, infection, low blood oxygen, bleeding from biopsy site, or an allergic reaction to medications.  It is uncommon for patients to experience other more serious complications for example: Collapsed lung requiring chest tube placement, respiratory failure, heart attack and/or cardiac arrhythmia.  Patient understood the potential complications and agreed to proceed.  Surgeon: Renold Don, MD Assistant/Scrub: Liborio Nixon, RRT Circulator: N/A Anesthesiologist/CRNA: Iran Ouch, MD/Janice Rise Patience, CRNA Cytotechnology: Maryan Puls, team lead  Fluoroscopy technician: Gerhard Munch, RT Representatives: Shon Millet, [facility] ([employer])  Type of Anesthesia: General endotracheal  Procedures Performed:   Robotic bronchoscopy: Procedure consists of robotic navigation comprised of electromagnetics, optical pattern recognition and robotic kinematic data - to triangulate bronchoscope location during the procedure and provide accurate positional data to biopsy a lesion. Cellvizio probe based confocal laser endomicroscopy (pCLE) utilizing blue laser endomicroscopy. Augmented fluoroscopy with Body Vision.  Description of Procedure:  Robotic  bronchoscopy: The patient was brought to Procedure Room 2 (Bronchoscopy Suite) in the OR area where appropriate timeout was taken with the staff after the patient was inducted under general anesthesia.  The patient was inducted under general anesthesia and intubated by the anesthesia team.  Patient was intubated with a 8.0 ET tube without difficulty.  Tube was secured at 4 cm above the carina.  A Portex adapter was placed on the ET tube flange.  Once the patient was under adequate general anesthesia the Olympus therapeutic video bronchoscope was advanced and an anatomic airway tour and surveillance bronchoscopy was performed.The distal trachea appeared unremarkable. The main carina was sharp.  No secretions were seen in either right or left mainstem bronchi. The RUL, RLL, RML appeared to be free of endobronchial masses, lesions, or purulent secretions. Likewise, the LLL/LUL appeared to be free of endobronchial masses, lesions, or purulent secretions.  There were some scant benign appearing secretions on the right upper lobe that were suctioned until cleared.  Once the survey bronchoscopy was completed, registration for the augmented fluoroscopy (Body Vision) was then performed with the fluoroscopic C arm.  Once this was completed, the robotic bronchoscope ET tube adapter was placed and ETT was cut to proper length and secured on the mid plane.  The [facility] robotic scope was then advanced through the ETT and registration was performed successfully.  There was good correlation between the robotic mapping and bronchoscopic mapping. With the assistance of fused navigation, the bronchoscope was advanced to the LUL nodule/mass. The tip of the working channel sat within 12 mm of the nodule  Positioning was confirmed with augmented fluoroscopy.  At this point Cellvizio probe based confocal laser endomicroscopy (pCLE) was utilized to confirm target acquisition.  The images through [employer] were consistent with target  acquisition.   Augmented fluoroscopy via Body Vision was utilized to optimize the position most favorable for biopsies, then the robotic bronchoscope was anchored to maintain position.  A total of 12 transbronchial biopsies were obtained at this  point, the lesion could be seen moving during biopsies.  2 of the biopsies were subjected to ROSE yielding atypical cells.  After confirmation of excellent hemostasis, 2 passes with a cytology brush on the LUL nodule/mass were performed, the brushes were cut into CytoLyt preservative.  ROSE was performed and one of the brushes yielding atypical cells as well.  Transbronchial needle aspirate was also performed, ROSE yielded only inflammatory changes.  A BAL was also obtained at this segment, which sent for cytology analysis.  For BAL sample acquisition purposes, 20 ml of normal saline were instilled, and approximately 7 ml were recovered/trapped and sent for analysis.   The robotic bronchoscope was then retracted all the way out after confirmation of excellent hemostasis.  At this point the Olympus endobronchial ultrasound (EBUS) scope was inserted through the existing ET tube.  And the mediastinum was examined there was a cluster of lymph nodes noted in the subcarinal space, utilizing a Cook 22-gauge EBUS needle transbronchial needle aspirates were performed x 6 through the LEFT subcarinal space.  ROSE identified adequate sampling of lymph nodes.  Material was placed in CytoLyt for analysis.  Examination of the remainder mediastinal structures showed no significant adenopathy.  Once this was completed the endobronchial ultrasound scope was removed and the regular therapeutic bronchoscope reinserted.  Examination of the airway showed excellent hemostasis.The patient received bronchial lavage with 9 mL of 1% lidocaine via the ET tube. The patient tolerated the procedure well. No significant bleeding was observed at the conclusion of the procedure.  At this point, the patient  was allowed to emerge from general anesthesia, and was extubated in the procedure room without incident.  The patient  was taken to the PACU in satisfactory condition.  Auscultation of the lungs showed no change from pre bronchoscopy examination.  Patient tolerated the procedure well.     Specimens Obtained:  Transbronchial Forceps Biopsy: X 12, LUL  Transbronchial Brush: X 2, LUL  Transbronchial Needle Aspirate: X 1, LUL  EBUS TBNA: X 6, station 7 (subcarinal space)  Targeted BAL: LUL, 7 mL aliquot  Fluoroscopy: Augmented fluoroscopy (Body Vision) was utilized during the course of this procedure to assure that biopsies were taken in a safe manner under fluoroscopic guidance with spot films required.  Total fluoroscopy time: 4 minutes 42 seconds, Total dose: 9.87 mGy.  Intraoperative images:  Robotic bronchoscope in position with Cellvizio probe in the nodule:    Target acquisition with robotic bronchoscope:    Cellvizio probe based confocal laser endomicroscopy (pCLE) images,3 total, showing distorted architecture and consistent with target acquisition highly suspicious for malignant manges:        EBUS image of the subcarinal space showing lymph node 1.8 x 1.5 cm:    EBUS needle sampling lymph node in the subcarinal space:    Excellent hemostasis after procedure:    Complications:None, no pneumothorax on post film:    Estimated Blood Loss: Nil    Assessment and Plan/Additional Comments: Left upper lobe nodule highly suspicious for carcinoma, status post biopsy Subcarinal adenopathy status post TBNA Await pathology reports Patient has appropriate pulmonary follow-up    C. Derrill Kay, MD Advanced Bronchoscopy PCCM [facility]    *This note was dictated using voice recognition software/Dragon.  Despite best efforts to proofread, errors can occur which can change the meaning.  Any change was purely unintentional.

## 2022-01-17 NOTE — Anesthesia Preprocedure Evaluation (Addendum)
Anesthesia Evaluation  Patient identified by MRN, date of birth, ID band Patient awake    Reviewed: Allergy & Precautions, NPO status , Patient's Chart, lab work & pertinent test results  History of Anesthesia Complications (+) PONV and history of anesthetic complications  Airway Mallampati: II  TM Distance: >3 FB Neck ROM: Full    Dental  (+) Dental Advisory Given, Edentulous Upper, Missing   Pulmonary shortness of breath and with exertion, neg sleep apnea, COPD, neg recent URI, Patient abstained from smoking.Not current smoker, former smoker never hospitalized for COPD, not on home O2   Pulmonary exam normal breath sounds clear to auscultation       Cardiovascular Exercise Tolerance: Good METShypertension, (-) angina + Peripheral Vascular Disease, +CHF (DD) and + DOE  (-) CAD and (-) Past MI (-) dysrhythmias  Rhythm:Regular Rate:Normal - Systolic murmurs TTE 2020:  1. The left ventricle has normal systolic function with an ejection  fraction of 60-65%. The cavity size was normal. Left ventricular diastolic  Doppler parameters are consistent with impaired relaxation.   2. The right ventricle has normal systolic function. The cavity was  normal. There is no increase in right ventricular wall thickness.   3. Right atrial pressure is estimated at 10 mmHg.   4. Unable to estimate RVSP    Neuro/Psych  PSYCHIATRIC DISORDERS Anxiety     negative neurological ROS     GI/Hepatic ,GERD  Controlled,,(+)     (-) substance abuse    Endo/Other  neg diabetes  Morbid obesity  Renal/GU negative Renal ROS     Musculoskeletal   Abdominal  (+) + obese  Peds  Hematology   Anesthesia Other Findings Past Medical History: No date: Anxiety No date: CAP (community acquired pneumonia) No date: COPD (chronic obstructive pulmonary disease) (HCC) No date: Dyspnea     Comment:  DUE TO HEAT WITH COPD 01/09/2017: Epistaxis No date: GERD  (gastroesophageal reflux disease)     Comment:  OCC-NO MEDS No date: Hypertension No date: Hypothyroidism No date: New onset of headaches No date: Osteopenia No date: PMR (polymyalgia rheumatica) (HCC) 02/13/2018: Pneumonia No date: PONV (postoperative nausea and vomiting)     Comment:  VERY NAUSEATED AFTER SURGERY No date: Stasis dermatitis 01/20/2011: Strain of chest wall No date: Tobacco abuse  Reproductive/Obstetrics                              Anesthesia Physical Anesthesia Plan  ASA: 3  Anesthesia Plan: General   Post-op Pain Management: Minimal or no pain anticipated   Induction: Intravenous  PONV Risk Score and Plan: 4 or greater and Ondansetron, Dexamethasone and Treatment may vary due to age or medical condition  Airway Management Planned: Oral ETT  Additional Equipment: None  Intra-op Plan:   Post-operative Plan: Extubation in OR  Informed Consent: I have reviewed the patients History and Physical, chart, labs and discussed the procedure including the risks, benefits and alternatives for the proposed anesthesia with the patient or authorized representative who has indicated his/her understanding and acceptance.     Dental advisory given  Plan Discussed with: CRNA and Surgeon  Anesthesia Plan Comments: (Discussed risks of anesthesia with patient, including PONV, sore throat, lip/dental damage. Rare risks discussed as well, such as cardiorespiratory and neurological sequelae. Patient understands.)         Anesthesia Quick Evaluation

## 2022-01-17 NOTE — Interval H&P Note (Signed)
Donnie Mesa Scheier has presented today for surgery, with the diagnosis of LEFT LUNG NODULE and SUBCARINAL LYMPHADENOPATHY.  The various methods of treatment have been discussed with the patient and family. After consideration of risks, benefits and other options for treatment, the patient has consented to  Procedure(s): ROBOTIC ASSISTED NAVIGATIONAL BRONCHOSCOPY-LEFT and ENDOBRONCHIAL ULTRASOUND WITH TBNA as a surgical intervention.  The patient's history has been reviewed, patient examined, no change in status, stable for surgery.  I have reviewed the patient's chart and labs.  Questions were answered to the patient's satisfaction.  Benefits, limitations and potential complications of the procedure were discussed with the patient/family.  Complications from bronchoscopy are rare and most often minor, but if they occur they may include breathing difficulty, vocal cord spasm, hoarseness, slight fever, vomiting, dizziness, bronchospasm, infection, low blood oxygen, bleeding from biopsy site, or an allergic reaction to medications.  It is uncommon for patients to experience other more serious complications for example: Collapsed lung requiring chest tube placement, respiratory failure, heart attack and/or cardiac arrhythmia.  Patient agrees to proceed.  Gailen Shelter, MD Advanced Bronchoscopy PCCM [facility]    *This note was dictated using voice recognition software/Dragon.  Despite best efforts to proofread, errors can occur which can change the meaning. Any transcriptional errors that result from this process are unintentional and may not be fully corrected at the time of dictation.

## 2022-01-17 NOTE — Progress Notes (Signed)
Patient arrived to pacu, awake/alert x4. Chest film completed, read by Dr., Patsey Berthold: ok for discharge. Patient extremely anxious, c/o's sore throat and "my insides are shaking" Cepacol  lozenges obtained and administered for sore throat. Patient received 25mg  benadryl IV and * precedex in OR.  Will continue to monitor closely.  Dr. Patsey Berthold aware of above.

## 2022-01-17 NOTE — Progress Notes (Signed)
Patient remains anxious, eating peanut butter crackers and gingerale and c/o's "nausea" no emesis.  4mg  zofran SIVP administered.  Heart remains elevated:  Dr. Patsey Berthold aware.  Afebrile.

## 2022-01-17 NOTE — Discharge Instructions (Signed)

## 2022-01-17 NOTE — Anesthesia Procedure Notes (Signed)
Procedure Name: Intubation Date/Time: 01/17/2022 12:56 PM  Performed by: Omer Jack, CRNAPre-anesthesia Checklist: Patient identified, Patient being monitored, Timeout performed, Emergency Drugs available and Suction available Patient Re-evaluated:Patient Re-evaluated prior to induction Oxygen Delivery Method: Circle system utilized Preoxygenation: Pre-oxygenation with 100% oxygen Induction Type: IV induction Ventilation: Mask ventilation without difficulty Laryngoscope Size: 3 and McGraph Grade View: Grade I Tube type: Oral Tube size: 8.0 mm Number of attempts: 1 Airway Equipment and Method: Stylet Placement Confirmation: ETT inserted through vocal cords under direct vision, positive ETCO2 and breath sounds checked- equal and bilateral Secured at: 21 cm Tube secured with: Tape Dental Injury: Teeth and Oropharynx as per pre-operative assessment

## 2022-01-17 NOTE — Transfer of Care (Signed)
Immediate Anesthesia Transfer of Care Note  Patient: Teresa Jensen  Procedure(s) Performed: ROBOTIC ASSISTED NAVIGATIONAL BRONCHOSCOPY (Left) ENDOBRONCHIAL ULTRASOUND (Left)  Patient Location: PACU  Anesthesia Type:General  Level of Consciousness: awake, alert , and oriented  Airway & Oxygen Therapy: Patient Spontanous Breathing and Patient connected to face mask oxygen  Post-op Assessment: Report given to RN and Post -op Vital signs reviewed and stable  Post vital signs: Reviewed and stable  Last Vitals:  Vitals Value Taken Time  BP 127/52 01/17/22 1436  Temp    Pulse 90 01/17/22 1436  Resp 17 01/17/22 1436  SpO2 90 % 01/17/22 1436  Vitals shown include unvalidated device data.  Last Pain:  Vitals:   01/17/22 1119  TempSrc: Temporal  PainSc: 0-No pain         Complications: No notable events documented.

## 2022-01-18 ENCOUNTER — Other Ambulatory Visit: Payer: Self-pay | Admitting: Pathology

## 2022-01-18 ENCOUNTER — Encounter: Payer: Self-pay | Admitting: Pulmonary Disease

## 2022-01-18 ENCOUNTER — Other Ambulatory Visit: Payer: Self-pay

## 2022-01-18 DIAGNOSIS — C3412 Malignant neoplasm of upper lobe, left bronchus or lung: Secondary | ICD-10-CM

## 2022-01-18 LAB — CYTOLOGY - NON PAP

## 2022-01-18 LAB — SURGICAL PATHOLOGY

## 2022-01-18 NOTE — Anesthesia Postprocedure Evaluation (Signed)
Anesthesia Post Note  Patient: Teresa Jensen  Procedure(s) Performed: ROBOTIC ASSISTED NAVIGATIONAL BRONCHOSCOPY (Left) ENDOBRONCHIAL ULTRASOUND (Left)  Patient location during evaluation: PACU Anesthesia Type: General Level of consciousness: awake and alert Pain management: pain level controlled Vital Signs Assessment: post-procedure vital signs reviewed and stable Respiratory status: spontaneous breathing, nonlabored ventilation and respiratory function stable Cardiovascular status: blood pressure returned to baseline and stable Postop Assessment: no apparent nausea or vomiting Anesthetic complications: no   No notable events documented.   Last Vitals:  Vitals:   01/17/22 1530 01/17/22 1539  BP: (!) 133/52 112/88  Pulse: 90 89  Resp:  (!) 22  Temp:  (!) 36.1 C  SpO2: 94% 92%    Last Pain:  Vitals:   01/17/22 1539  TempSrc: Temporal  PainSc: 0-No pain                 Foye Deer

## 2022-01-25 ENCOUNTER — Encounter: Payer: Self-pay | Admitting: Radiation Oncology

## 2022-01-25 ENCOUNTER — Ambulatory Visit
Admission: RE | Admit: 2022-01-25 | Discharge: 2022-01-25 | Disposition: A | Discharge status: Home / Self Care | Payer: Medicare Other | Source: Ambulatory Visit | Attending: Radiation Oncology | Admitting: Radiation Oncology

## 2022-01-25 ENCOUNTER — Encounter: Payer: Self-pay | Admitting: *Deleted

## 2022-01-25 VITALS — BP 144/73 | HR 81 | Temp 98.1°F | Resp 16 | Ht <= 58 in | Wt 182.5 lb

## 2022-01-25 DIAGNOSIS — C3412 Malignant neoplasm of upper lobe, left bronchus or lung: Secondary | ICD-10-CM | POA: Diagnosis present

## 2022-01-25 DIAGNOSIS — I5032 Chronic diastolic (congestive) heart failure: Secondary | ICD-10-CM | POA: Diagnosis not present

## 2022-01-25 DIAGNOSIS — J449 Chronic obstructive pulmonary disease, unspecified: Secondary | ICD-10-CM | POA: Insufficient documentation

## 2022-01-25 DIAGNOSIS — Z803 Family history of malignant neoplasm of breast: Secondary | ICD-10-CM | POA: Insufficient documentation

## 2022-01-25 DIAGNOSIS — M8580 Other specified disorders of bone density and structure, unspecified site: Secondary | ICD-10-CM | POA: Diagnosis not present

## 2022-01-25 DIAGNOSIS — I2510 Atherosclerotic heart disease of native coronary artery without angina pectoris: Secondary | ICD-10-CM | POA: Diagnosis not present

## 2022-01-25 DIAGNOSIS — I110 Hypertensive heart disease with heart failure: Secondary | ICD-10-CM | POA: Insufficient documentation

## 2022-01-25 DIAGNOSIS — J8410 Pulmonary fibrosis, unspecified: Secondary | ICD-10-CM | POA: Insufficient documentation

## 2022-01-25 DIAGNOSIS — M353 Polymyalgia rheumatica: Secondary | ICD-10-CM | POA: Diagnosis not present

## 2022-01-25 DIAGNOSIS — E039 Hypothyroidism, unspecified: Secondary | ICD-10-CM | POA: Insufficient documentation

## 2022-01-25 DIAGNOSIS — Z6839 Body mass index (BMI) 39.0-39.9, adult: Secondary | ICD-10-CM | POA: Diagnosis not present

## 2022-01-25 DIAGNOSIS — Z87891 Personal history of nicotine dependence: Secondary | ICD-10-CM | POA: Insufficient documentation

## 2022-01-25 DIAGNOSIS — Z79899 Other long term (current) drug therapy: Secondary | ICD-10-CM | POA: Diagnosis not present

## 2022-01-25 DIAGNOSIS — Z8042 Family history of malignant neoplasm of prostate: Secondary | ICD-10-CM | POA: Diagnosis not present

## 2022-01-25 DIAGNOSIS — I872 Venous insufficiency (chronic) (peripheral): Secondary | ICD-10-CM | POA: Insufficient documentation

## 2022-01-25 DIAGNOSIS — Z79890 Hormone replacement therapy (postmenopausal): Secondary | ICD-10-CM | POA: Insufficient documentation

## 2022-01-25 NOTE — Progress Notes (Signed)
NEW PATIENT EVALUATION  Name: Teresa Jensen  MRN: [id number]  Date:   01/25/2022     DOB: 1945/02/01   This 77 y.o. 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  REFERRING PHYSICIAN: Pleas Koch, NP  CHIEF COMPLAINT:  Chief Complaint  Patient presents with   Lung Cancer    DIAGNOSIS: The encounter diagnosis was Malignant neoplasm of bronchus of left upper lobe ([place]).   PREVIOUS INVESTIGATIONS:  CT scan and PET CT scan reviewed compatible with above-stated findings  pathology report reviewed Clinical notes reviewed  HPI: Patient is a 77 year old female followed by Dr. Melvyn Novas and was found to have a left upper lobe lung nodule.  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  PET CT scan confirmed hypermetabolic activity in the nodule and questionable mild compromise Bolick activity and precarinal lymph nodes.  Navigational bronchoscopy which was positive for squamous cell carcinoma of the lung nodule.  Biopsy of precarinal lymph node was negative for malignancy.  He is doing fairly well she does have some dyspnea on exertion no significant cough.  She is now referred for consideration of SBRT.  PLANNED TREATMENT REGIMEN: SBRT  PAST MEDICAL HISTORY:  has a past medical history of (HFpEF) heart failure with preserved ejection fraction ([place]) (06/28/2011), Adenoma of left adrenal gland, Anemia, Anxiety, Aortic atherosclerosis ([place]), Bilateral carpal tunnel syndrome, CAD (coronary artery disease), CAP (community acquired pneumonia), COPD (chronic obstructive pulmonary disease) ([place]), Diverticulosis, Dyspnea, Epistaxis (01/09/2017), GERD (gastroesophageal reflux disease), Hypertension, Hypothyroidism, Iliac artery stenosis, bilateral ([place]), Morbid obesity with BMI of 40.0-44.9, adult ([place]), New onset of headaches, Osteopenia, PMR (polymyalgia rheumatica) ([place]), Pneumonia  (02/13/2018), PONV (postoperative nausea and vomiting), Pulmonary nodule, left, Shingles, Stasis dermatitis, Strain of chest wall (01/20/2011), Tobacco abuse, and Vitamin D deficiency.    PAST SURGICAL HISTORY:  Past Surgical History:  Procedure Laterality Date   ANTERIOR CERVICAL DECOMP/DISCECTOMY FUSION  06/22/2004   C4-C6   APPENDECTOMY     CARPAL TUNNEL RELEASE Left 08/31/2019   Procedure: CARPAL TUNNEL RELEASE;  Surgeon: Dereck Leep, MD;  Location: [facility] ORS;  Service: Orthopedics;  Laterality: Left;   CARPAL TUNNEL RELEASE Right 06/22/2020   Procedure: CARPAL TUNNEL RELEASE;  Surgeon: Dereck Leep, MD;  Location: [facility] OR[facility];  Service: Orthopedics;  Laterality: Right;   CHOLECYSTECTOMY     COLONOSCOPY     ENDOBRONCHIAL ULTRASOUND Left 01/17/2022   Procedure: ENDOBRONCHIAL ULTRASOUND;  Surgeon: Tyler Pita, MD;  Location: [facility] ORS;  Service: Pulmonary;  Laterality: Left;   ESOPHAGOGASTRODUODENOSCOPY     HEMORRHOID SURGERY     TOTAL ABDOMINAL HYSTERECTOMY W/ BILATERAL SALPINGOOPHORECTOMY     TUBAL LIGATION      FAMILY HISTORY: family history includes Breast cancer in her paternal grandmother; Cancer in her father; Cirrhosis in her brother; Diabetes in her brother and sister; Heart attack (age of onset: 55) in her mother; Heart disease in her brother and sister; Hypertension in her sister; Prostate cancer in her father; Tuberculosis in her sister.  SOCIAL HISTORY:  reports that she quit smoking about 12 years ago. Her smoking use included cigarettes. She has a 30.00 pack-year smoking history. She has never used smokeless tobacco. She reports that she does not drink alcohol and does not use drugs.  ALLERGIES: Epinephrine, Food, Mangifera indica, Other, Prednisone, Milk-related compounds, Pneumococcal 13-val conj vacc, and Prevnar 13 [pneumococcal 13-val conj vacc]  MEDICATIONS:  Current Outpatient Medications  Medication Sig Dispense Refill   amLODipine (NORVASC) 5 MG tablet  TAKE 1 TABLET BY MOUTH EVERY DAY FOR BLOOD PRESSURE 90 tablet 2   Calcium Carb-Cholecalciferol (CALCIUM + VITAMIN D3 PO) Take 1,200 mg by mouth daily.     famotidine (PEPCID) 20 MG tablet One after supper 30 tablet 11   Glycopyrrolate-Formoterol (BEVESPI AEROSPHERE) 9-4.8 MCG/ACT AERO Inhale 2 puffs into the lungs 2 (two) times daily. 1 each 11   hydroxychloroquine (PLAQUENIL) 200 MG tablet TAKE 1 TABLET BY MOUTH TWICE A DAY 180 tablet 1   levothyroxine (SYNTHROID) 75 MCG tablet TAKE 1 TABLET BY MOUTH EVERY MORNING ON AN EMPTY STOMACH WITH WATER ONLY. NO FOOD OR OTHER MEDICATIONS FOR 30 MINUTES. 90 tablet 3   losartan (COZAAR) 100 MG tablet TAKE 1 TABLET BY MOUTH EVERY DAY 90 tablet 2   Multiple Vitamins-Minerals (PRESERVISION AREDS 2 PO) Take 1 tablet by mouth in the morning and at bedtime.     tacrolimus (PROTOPIC) 0.1 % ointment Apply to aa's lower legs BID PRN. 60 g 3   No current facility-administered medications for this encounter.    ECOG PERFORMANCE STATUS:  0 - Asymptomatic  REVIEW OF SYSTEMS: Patient is history of pulmonary fibrosis cervical fusion Patient denies any weight loss, fatigue, weakness, fever, chills or night sweats. Patient denies any loss of vision, blurred vision. Patient denies any ringing  of the ears or hearing loss. No irregular heartbeat. Patient denies heart murmur or history of fainting. Patient denies any chest pain or pain radiating to her upper extremities. Patient denies any shortness of breath, difficulty breathing at night, cough or hemoptysis. Patient denies any swelling in the lower legs. Patient denies any nausea vomiting, vomiting of blood, or coffee ground material in the vomitus. Patient denies any stomach pain. Patient states has had normal bowel movements no significant constipation or diarrhea. Patient denies any dysuria, hematuria or significant nocturia. Patient denies any problems walking, swelling in the joints or loss of balance. Patient denies any  skin changes, loss of hair or loss of weight. Patient denies any excessive worrying or anxiety or significant depression. Patient denies any problems with insomnia. Patient denies excessive thirst, polyuria, polydipsia. Patient denies any swollen glands, patient denies easy bruising or easy bleeding. Patient denies any recent infections, allergies or URI. Patient "s visual fields have not changed significantly in recent time.   PHYSICAL EXAM: BP (!) 144/73 (BP Location: Right Wrist, Patient Position: Sitting, Cuff Size: Small)   Pulse 81   Temp 98.1 F (36.7 C) (Tympanic)   Resp 16   Ht 4\' 9"  (1.448 m)   Wt 182 lb 8 oz (82.8 kg)   BMI 39.49 kg/m  Well-developed well-nourished patient in NAD. HEENT reveals PERLA, EOMI, discs not visualized.  Oral cavity is clear. No oral mucosal lesions are identified. Neck is clear without evidence of cervical or supraclavicular adenopathy. Lungs are clear to A&P. Cardiac examination is essentially unremarkable with regular rate and rhythm without murmur rub or thrill. Abdomen is benign with no organomegaly or masses noted. Motor sensory and DTR levels are equal and symmetric in the upper and lower extremities. Cranial nerves II through XII are grossly intact. Proprioception is intact. No peripheral adenopathy or edema is identified. No motor or sensory levels are noted. Crude visual fields are within normal range.  LABORATORY DATA: Cytology and pathology reports reviewed    RADIOLOGY RESULTS: Scans and PET CT scans reviewed compatible with above-stated findings   IMPRESSION:  Stage I squamous cell carcinoma left upper lobe in 77 year old female with pulmonary fibrosis  PLAN: At this time based on her history of pulmonary fibrosis being declined by thoracic surgery for resection I would offer SBRT to left upper lobe nodule.  I would plan delivering 60 Gray in 5 fractions.  Diffuse for dimensional treatment planning and motion restriction for treatment planning  purposes risks and benefits of treatment including extremely low side effect profile possibility of cough after completion of treatment and fatigue all were reviewed with the patient.  I personally set up and ordered CT simulation for early next week.  Patient comprehends my treatment plan well.  I would like to take this opportunity to thank you for allowing me to participate in the care of your patient.Noreene Filbert, MD

## 2022-01-30 ENCOUNTER — Ambulatory Visit
Admission: RE | Admit: 2022-01-30 | Discharge: 2022-01-30 | Disposition: A | Discharge status: Home / Self Care | Payer: Medicare Other | Source: Ambulatory Visit | Attending: Radiation Oncology | Admitting: Radiation Oncology

## 2022-01-30 DIAGNOSIS — Z510 Encounter for antineoplastic radiation therapy: Secondary | ICD-10-CM | POA: Insufficient documentation

## 2022-01-30 DIAGNOSIS — C3412 Malignant neoplasm of upper lobe, left bronchus or lung: Secondary | ICD-10-CM | POA: Insufficient documentation

## 2022-02-05 ENCOUNTER — Ambulatory Visit: Payer: Medicare Other

## 2022-02-07 ENCOUNTER — Ambulatory Visit: Payer: Medicare Other

## 2022-02-08 ENCOUNTER — Encounter: Payer: Self-pay | Admitting: Pulmonary Disease

## 2022-02-08 ENCOUNTER — Ambulatory Visit: Payer: Medicare Other

## 2022-02-08 ENCOUNTER — Ambulatory Visit (INDEPENDENT_AMBULATORY_CARE_PROVIDER_SITE_OTHER): Payer: Medicare Other | Admitting: Pulmonary Disease

## 2022-02-08 VITALS — BP 138/76 | HR 78 | Temp 97.8°F | Ht <= 58 in | Wt 184.2 lb

## 2022-02-08 DIAGNOSIS — J3089 Other allergic rhinitis: Secondary | ICD-10-CM

## 2022-02-08 DIAGNOSIS — J8410 Pulmonary fibrosis, unspecified: Secondary | ICD-10-CM | POA: Diagnosis not present

## 2022-02-08 DIAGNOSIS — C3412 Malignant neoplasm of upper lobe, left bronchus or lung: Secondary | ICD-10-CM | POA: Diagnosis not present

## 2022-02-08 DIAGNOSIS — J449 Chronic obstructive pulmonary disease, unspecified: Secondary | ICD-10-CM | POA: Diagnosis not present

## 2022-02-08 DIAGNOSIS — R590 Localized enlarged lymph nodes: Secondary | ICD-10-CM

## 2022-02-08 DIAGNOSIS — K219 Gastro-esophageal reflux disease without esophagitis: Secondary | ICD-10-CM

## 2022-02-08 NOTE — Progress Notes (Signed)
Subjective:    Patient ID: Teresa Jensen, female    DOB: [birth year], 77 y.o.   MRN: [id number] Patient Care Team: Pleas Koch, NP as PCP - General (Internal Medicine) End, Harrell Gave, MD as PCP - Cardiology (Cardiology) [place], Jennefer Bravo, MD as Referring Physician (Dermatology) Vladimir Crofts, MD as Consulting Physician (Neurology) Birder Robson, MD as Referring Physician (Ophthalmology) Alfonso Patten, MD as Consulting Physician (Dermatology) Tyler Pita, MD as Consulting Physician (Pulmonary Disease)  Chief Complaint  Patient presents with   Follow-up    Bronch was on 01/17/2022   HPI Patient is a 77 year old remote former smoker (15 PY) with a history as noted below, who presents for follow-up after bronchoscopy on 17 January 2022.  She was previously followed by Dr. Christinia Gully at this office.  Patient has issues with COPD GOLD class II and post inflammatory pulmonary fibrosis. This triggered the request of a high-resolution CT chest which was performed on 30 October 2021.  This revealed a left upper lobe nodule and subsequently the patient had PET/CT which showed hypermetabolism on this nodule and questionable mild hypermetabolism on the sub-carinal lymph nodes. The patient was subsequently referred to me on 02 January 2022 after thoracic surgery deemed that the patient was not an optimal candidate for surgery and also after thoracic surgery requested that EBUS be performed on the subcarinal lymph nodes.  Patient underwent the procedure on 17 January 2022 this was robotic assisted navigational bronchoscopy to biopsy the left upper lobe nodule and she underwent EBUS evaluation of the mediastinum showing cluster of lymph nodes in the subcarinal space that were sampled and were benign.  The left upper lobe nodule was positive for squamous cell carcinoma.  Patient has now been presented to Tumor Board and the consensus was to refer her for SBRT.  She is to start SBRT  for a total of 5 treatments.  She has been doing well with regards to her respiratory status.  Bevespi helps with her COPD symptoms.  Previously Dr. Melvyn Novas had prescribed Protonix for GERD symptoms.  She finished the Protonix prescription.  She notes that it did not change her morning cough in the slightest.  She does take Pepcid at bedtime which helps with her reflux symptoms.  Her morning cough is usually associated with nasal congestion and postnasal drip per her account.  She has not had any fevers, chills or sweats.  No chest pain.  No hemoptysis.  Overall tolerated the bronchoscopy very well without any difficulties.   DATA 06/2010 PFTs: FEV1 1.20 (56%), FVC 1.77 L or 63% predicted, FEV1/FVC 68%, significant airtrapping (RV 207%), DLCO 46% but nearly corrects with Alv vol. 08/30/2021 PFTs: FEV1 0.95 L or 85% predicted, FVC 1.41 L or 71% predicted, FEV1/FVC 67%, normal lung volumes, no significant response.  Moderate diffusion capacity impairment which corrects to alveolar volume. 01/17/2022 robotic bronchoscopy + EBUS: Left upper lobe nodule positive for squamous cell carcinoma.  TBNA negative for malignancy on subcarinal nodes with sufficient lymph node material consistent with sampling of lymph node.  Review of Systems A 10 point review of systems was performed and it is as noted above otherwise negative.  Patient Active Problem List   Diagnosis Date Noted   Solitary pulmonary nodule 11/27/2021   Upper extremity pain, anterior, right 08/04/2021   Lower extremity pain 08/04/2021   Postinflammatory pulmonary fibrosis ([place]) 07/27/2021   Acute cough 05/05/2021   Acute gastritis 05/01/2021   Morbid obesity ([place]) 05/01/2021   Grief  10/12/2020   Foot callus 09/01/2020   S/P carpal tunnel release 08/04/2020   Rash and nonspecific skin eruption 07/07/2020   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   Personal history of colonic polyps 11/01/2019   Bilateral carpal tunnel syndrome 08/16/2019    Skin mass 04/30/2019   Chronic heart failure with preserved ejection fraction (HFpEF) ([place]) 11/12/2018   DOE (dyspnea on exertion) 11/12/2018   Abnormal electrocardiogram 11/12/2018   Left leg pain 04/17/2018   COPD exacerbation ([place]) 01/21/2018   Osteoporosis 12/16/2017   Back pain 06/06/2017   Low vitamin B12 level 01/13/2017   Iatrogenic hypotension 01/12/2017   Acute kidney failure ([place]) 01/12/2017   Anemia 01/12/2017   Essential hypertension 01/08/2017   Epistaxis 01/07/2017   Hand numbness 10/22/2016   Right hand weakness 10/22/2016   Closed fracture of metatarsal bone 09/28/2016   Prediabetes 10/17/2015   Herpes zoster 07/08/2015   Pain of right lower extremity 05/20/2015   Medicare annual wellness visit, subsequent 10/12/2014   Occipital neuralgia 10/12/2014   Vitamin D deficiency 10/12/2014   Erythema of lower extremity 09/08/2013   Persistent cough 06/14/2010   COPD GOLD 2  06/05/2010   Hypothyroidism 09/29/2009   Situational anxiety 10/01/2008   COPD 10/01/2008   GERD 10/01/2008   Osteoarthritis 10/01/2008   Social History   Tobacco Use   Smoking status: Former    Packs/day: 1.50    Years: 20.00    Total pack years: 30.00    Types: Cigarettes    Quit date: 02/11/2009    Years since quitting: 13.0   Smokeless tobacco: Never  Substance Use Topics   Alcohol use: No    Alcohol/week: 0.0 standard drinks of alcohol   Allergies  Allergen Reactions   Epinephrine Anaphylaxis and Shortness Of Breath   Food Rash    MANGO = cause rashes around the mouth LIPS GO NUMB   Mangifera Indica Rash    THIS IS MANGO LIPS GO NUMB   Other Rash    MANGO = cause rashes around the mouth MANGO = cause rashes around the mouth LIPS GO NUMB   Prednisone Anaphylaxis, Hives, Shortness Of Breath, Itching and Swelling    The face swells HAS TAKEN CORTISONE SHOTS WITHOUT ISSUE BEFORE   Milk-Related Compounds Diarrhea    DAIRY PRODUCTS ARE A PROBLEM   Pneumococcal 13-Val Conj  Vacc Itching and Other (See Comments)    Arm temp elevated at site of injection, red, itching   Prevnar 13 [Pneumococcal 13-Val Conj Vacc] Other (See Comments)    Arm temp elevated at site of injection, red, itching   Current Meds  Medication Sig   amLODipine (NORVASC) 5 MG tablet TAKE 1 TABLET BY MOUTH EVERY DAY FOR BLOOD PRESSURE   Calcium Carb-Cholecalciferol (CALCIUM + VITAMIN D3 PO) Take 1,200 mg by mouth daily.   famotidine (PEPCID) 20 MG tablet One after supper   Glycopyrrolate-Formoterol (BEVESPI AEROSPHERE) 9-4.8 MCG/ACT AERO Inhale 2 puffs into the lungs 2 (two) times daily.   hydroxychloroquine (PLAQUENIL) 200 MG tablet TAKE 1 TABLET BY MOUTH TWICE A DAY   levothyroxine (SYNTHROID) 75 MCG tablet TAKE 1 TABLET BY MOUTH EVERY MORNING ON AN EMPTY STOMACH WITH WATER ONLY. NO FOOD OR OTHER MEDICATIONS FOR 30 MINUTES.   losartan (COZAAR) 100 MG tablet TAKE 1 TABLET BY MOUTH EVERY DAY   Multiple Vitamins-Minerals (PRESERVISION AREDS 2 PO) Take 1 tablet by mouth in the morning and at bedtime.   tacrolimus (PROTOPIC) 0.1 % ointment Apply to aa's lower  legs BID PRN.   Immunization History  Administered Date(s) Administered   Fluad Quad(high Dose 65+) 09/02/2020, 10/12/2021   Influenza Whole 10/01/2008   Influenza,inj,Quad PF,6+ Mos 10/12/2014, 10/13/2015, 10/17/2016, 10/17/2017, 10/24/2018, 10/27/2019   PFIZER(Purple Top)SARS-COV-2 Vaccination 03/11/2019, 04/01/2019   Pneumococcal Conjugate-13 10/12/2014   Td 10/01/2008       Objective:   Physical Exam BP 138/76 (BP Location: Left Arm, Cuff Size: Large)   Pulse 78   Temp 97.8 F (36.6 C)   Ht 4\' 9"  (1.448 m)   Wt 184 lb 3.2 oz (83.6 kg)   SpO2 94%   BMI 39.86 kg/m   SpO2: 94 % O2 Device: None (Room air)  GENERAL: Obese woman, no acute distress.  Fully ambulatory, no conversational dyspnea. HEAD: Normocephalic, atraumatic.  EYES: Pupils equal, round, reactive to light.  No scleral icterus.  MOUTH: Dentition intact,  Mallampati I, oral mucosa moist. NECK: Supple. No thyromegaly. Trachea midline. No JVD.  No adenopathy. PULMONARY: Good air entry bilaterally.  Coarse, otherwise, no adventitious sounds. CARDIOVASCULAR: S1 and S2. Regular rate and rhythm.  Grade 1/6 systolic ejection murmur in the mitral position, no gallops or rubs noted. ABDOMEN: Obese, otherwise benign. MUSCULOSKELETAL: No joint deformity, no clubbing, no edema.  NEUROLOGIC: No overt focal deficit, no gait disturbance, speech is fluent. SKIN: Intact,warm,dry. PSYCH: Calm but occasionally tearful.  Behavior normal.     Assessment & Plan:     ICD-10-CM   1. Squamous cell carcinoma of bronchus in left upper lobe (HCC)  C34.12    Left upper lobe nodule 1.5 cm Status post robotic bronchoscopy 1/17 Not operative candidate SBRT    2. Mediastinal adenopathy  R59.0    Subcarinal nodes sampled No evidence of malignancy    3. COPD GOLD 2   J44.9    Well compensated on Bevespi Continue as needed albuterol as well    4. Postinflammatory pulmonary fibrosis (HCC)  J84.10    No evidence of progression    5. Gastroesophageal reflux disease, unspecified whether esophagitis present  K21.9    Patient completed pantoprazole Well-controlled on Pepcid at bedtime Continue Pepcid    6. Perennial allergic rhinitis  J30.89    Significant postnasal drip particular in the mornings Trial of Zyrtec or Chlor-Trimeton at nighttime     Ms. Buntyn appears to be doing well after her bronchoscopy without any sequela.  She is to start SBRT for her left upper lobe stage I squamous cell carcinoma of the lung.  We will see her in follow-up in 3 to 4 months time she is to contact us prior to that time should any new difficulties arise.  Renold Don, MD Advanced Bronchoscopy PCCM [facility]    *This note was dictated using voice recognition software/Dragon.  Despite best efforts to proofread, errors can occur which can change the  meaning. Any transcriptional errors that result from this process are unintentional and may not be fully corrected at the time of dictation.

## 2022-02-08 NOTE — Patient Instructions (Signed)
Keep your appointment with Dr. Baruch Gouty.  Your lungs sounded good today.  For your cough and congestion in the mornings continue taking Pepcid at nighttime.  You may try also Zyrtec or Chlor-Trimeton (over-the-counter) at bedtime.  We will see you in follow-up in 3 to 4 months time call sooner should any new problems arise.

## 2022-02-12 DIAGNOSIS — Z510 Encounter for antineoplastic radiation therapy: Secondary | ICD-10-CM | POA: Diagnosis present

## 2022-02-12 DIAGNOSIS — C3412 Malignant neoplasm of upper lobe, left bronchus or lung: Secondary | ICD-10-CM | POA: Diagnosis not present

## 2022-02-13 ENCOUNTER — Other Ambulatory Visit: Payer: Self-pay

## 2022-02-13 ENCOUNTER — Ambulatory Visit
Admission: RE | Admit: 2022-02-13 | Discharge: 2022-02-13 | Disposition: A | Discharge status: Home / Self Care | Payer: Medicare Other | Source: Ambulatory Visit | Attending: Radiation Oncology | Admitting: Radiation Oncology

## 2022-02-13 DIAGNOSIS — Z510 Encounter for antineoplastic radiation therapy: Secondary | ICD-10-CM | POA: Diagnosis not present

## 2022-02-13 LAB — RAD ONC ARIA SESSION SUMMARY
Course Elapsed Days: 0
Plan Fractions Treated to Date: 1
Plan Prescribed Dose Per Fraction: 12 Gy
Plan Total Fractions Prescribed: 5
Plan Total Prescribed Dose: 60 Gy
Reference Point Dosage Given to Date: 12 Gy
Reference Point Session Dosage Given: 12 Gy
Session Number: 1

## 2022-02-15 ENCOUNTER — Ambulatory Visit
Admission: RE | Admit: 2022-02-15 | Discharge: 2022-02-15 | Disposition: A | Discharge status: Home / Self Care | Payer: Medicare Other | Source: Ambulatory Visit | Attending: Radiation Oncology | Admitting: Radiation Oncology

## 2022-02-15 ENCOUNTER — Other Ambulatory Visit: Payer: Self-pay

## 2022-02-15 DIAGNOSIS — Z510 Encounter for antineoplastic radiation therapy: Secondary | ICD-10-CM | POA: Diagnosis not present

## 2022-02-15 LAB — RAD ONC ARIA SESSION SUMMARY
Course Elapsed Days: 2
Plan Fractions Treated to Date: 2
Plan Prescribed Dose Per Fraction: 12 Gy
Plan Total Fractions Prescribed: 5
Plan Total Prescribed Dose: 60 Gy
Reference Point Dosage Given to Date: 24 Gy
Reference Point Session Dosage Given: 12 Gy
Session Number: 2

## 2022-02-19 ENCOUNTER — Other Ambulatory Visit: Payer: Self-pay | Admitting: Primary Care

## 2022-02-19 ENCOUNTER — Ambulatory Visit
Admission: RE | Admit: 2022-02-19 | Discharge: 2022-02-19 | Disposition: A | Discharge status: Home / Self Care | Payer: Medicare Other | Source: Ambulatory Visit | Attending: Radiation Oncology | Admitting: Radiation Oncology

## 2022-02-19 ENCOUNTER — Other Ambulatory Visit: Payer: Self-pay

## 2022-02-19 DIAGNOSIS — E038 Other specified hypothyroidism: Secondary | ICD-10-CM

## 2022-02-19 DIAGNOSIS — Z510 Encounter for antineoplastic radiation therapy: Secondary | ICD-10-CM | POA: Diagnosis not present

## 2022-02-19 LAB — RAD ONC ARIA SESSION SUMMARY
Course Elapsed Days: 6
Plan Fractions Treated to Date: 3
Plan Prescribed Dose Per Fraction: 12 Gy
Plan Total Fractions Prescribed: 5
Plan Total Prescribed Dose: 60 Gy
Reference Point Dosage Given to Date: 36 Gy
Reference Point Session Dosage Given: 12 Gy
Session Number: 3

## 2022-02-19 NOTE — Telephone Encounter (Signed)
Patient is due for general follow up in February. Please schedule, thank you!

## 2022-02-20 NOTE — Telephone Encounter (Signed)
Scheduled f/u for 02/22/22

## 2022-02-21 ENCOUNTER — Other Ambulatory Visit: Payer: Self-pay

## 2022-02-21 ENCOUNTER — Ambulatory Visit
Admission: RE | Admit: 2022-02-21 | Discharge: 2022-02-21 | Disposition: A | Discharge status: Home / Self Care | Payer: Medicare Other | Source: Ambulatory Visit | Attending: Radiation Oncology | Admitting: Radiation Oncology

## 2022-02-21 DIAGNOSIS — Z510 Encounter for antineoplastic radiation therapy: Secondary | ICD-10-CM | POA: Diagnosis not present

## 2022-02-21 LAB — RAD ONC ARIA SESSION SUMMARY
Course Elapsed Days: 8
Plan Fractions Treated to Date: 4
Plan Prescribed Dose Per Fraction: 12 Gy
Plan Total Fractions Prescribed: 5
Plan Total Prescribed Dose: 60 Gy
Reference Point Dosage Given to Date: 48 Gy
Reference Point Session Dosage Given: 12 Gy
Session Number: 4

## 2022-02-22 ENCOUNTER — Encounter: Payer: Self-pay | Admitting: Primary Care

## 2022-02-22 ENCOUNTER — Ambulatory Visit (INDEPENDENT_AMBULATORY_CARE_PROVIDER_SITE_OTHER): Payer: Medicare Other | Admitting: Primary Care

## 2022-02-22 VITALS — BP 122/64 | HR 76 | Temp 97.6°F | Ht <= 58 in | Wt 185.0 lb

## 2022-02-22 DIAGNOSIS — Z1231 Encounter for screening mammogram for malignant neoplasm of breast: Secondary | ICD-10-CM

## 2022-02-22 DIAGNOSIS — J449 Chronic obstructive pulmonary disease, unspecified: Secondary | ICD-10-CM

## 2022-02-22 DIAGNOSIS — R7303 Prediabetes: Secondary | ICD-10-CM | POA: Diagnosis not present

## 2022-02-22 DIAGNOSIS — M810 Age-related osteoporosis without current pathological fracture: Secondary | ICD-10-CM

## 2022-02-22 DIAGNOSIS — E038 Other specified hypothyroidism: Secondary | ICD-10-CM

## 2022-02-22 DIAGNOSIS — I5032 Chronic diastolic (congestive) heart failure: Secondary | ICD-10-CM

## 2022-02-22 DIAGNOSIS — E2839 Other primary ovarian failure: Secondary | ICD-10-CM | POA: Diagnosis not present

## 2022-02-22 DIAGNOSIS — I10 Essential (primary) hypertension: Secondary | ICD-10-CM | POA: Diagnosis not present

## 2022-02-22 DIAGNOSIS — M793 Panniculitis, unspecified: Secondary | ICD-10-CM | POA: Insufficient documentation

## 2022-02-22 DIAGNOSIS — Z86010 Personal history of colonic polyps: Secondary | ICD-10-CM

## 2022-02-22 DIAGNOSIS — Z1211 Encounter for screening for malignant neoplasm of colon: Secondary | ICD-10-CM

## 2022-02-22 DIAGNOSIS — K219 Gastro-esophageal reflux disease without esophagitis: Secondary | ICD-10-CM

## 2022-02-22 LAB — LIPID PANEL
Cholesterol: 141 mg/dL (ref 0–200)
HDL: 51.8 mg/dL (ref >39.00)
LDL Cholesterol: 59 mg/dL (ref 0–99)
NonHDL: 88.89
Total CHOL/HDL Ratio: 3
Triglycerides: 149 mg/dL (ref 0.0–149.0)
VLDL: 29.8 mg/dL (ref 0.0–40.0)

## 2022-02-22 LAB — TSH: TSH: 2.17 u[IU]/mL (ref 0.35–5.50)

## 2022-02-22 LAB — HEMOGLOBIN A1C: Hgb A1c MFr Bld: 5.5 % (ref 4.6–6.5)

## 2022-02-22 NOTE — Assessment & Plan Note (Signed)
Repeat A1C pending. 

## 2022-02-22 NOTE — Progress Notes (Signed)
Subjective:    Patient ID: Teresa Jensen, female    DOB: [birth year], 77 y.o.   MRN: [id number]  HPI  Teresa Jensen is a very pleasant 77 y.o. female with a history of hypertension, CHF, COPD, hypothyroidism, pulmonary fibrosis, newly diagnosed lung cancer who presents today for follow up of chronic conditions.  Her husband joins [place] today.   1) Hypothyroidism: Currently managed on levothyroxine 75 mcg. She is taking levothyroxine every morning on an empty stomach with water only.   No food or other medications for 30 minutes.   No heartburn medication, iron pills, calcium, vitamin D, or magnesium pills within four hours of taking levothyroxine.   She is due for repeat TSH today.  2) COPD: Following with pulmonology and is managed on Bevespi 9-4.8 mcg inhaler. She underwent bronchoscopy on 01/17/22 per Dr. Patsey Berthold for questionable hypermetabolism to left upper lobe nodule. 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 She was referred to the tumor board who recommended SBRT for 5 treatments.   Last evaluated by pulmonology on 02/28/22. Today she mentions that she's tolerating radiation well, has completed 4 treatments thus far.   3) Hypertension: Currently managed on losartan 100 mg daily and amlodipine 5 mg daily.  She denies chest pain and dizziness, and her shortness of breath has improved.   BP Readings from Last 3 Encounters:  02/22/22 122/64  02/08/22 138/76  01/25/22 (!) 144/73   4) Lipodermatosclerosis: Following with dermatology. Managed on Plaquenil 200 mg BID. Also completes regular eye exams. Last evaluation per dermatology was in November 2023.  Overall she feels that her leg redness is better.   Review of Systems  Constitutional:  Negative for unexpected weight change.  HENT:  Negative for rhinorrhea.   Respiratory:  Negative for cough and shortness of breath.   Cardiovascular:  Negative for chest pain.  Gastrointestinal:  Negative for  constipation and diarrhea.  Musculoskeletal:  Negative for arthralgias.  Allergic/Immunologic: Negative for environmental allergies.  Neurological:  Negative for dizziness and headaches.  Psychiatric/Behavioral:  The patient is not nervous/anxious.          Past Medical History:  Diagnosis Date   (HFpEF) heart failure with preserved ejection fraction ([place]) 06/28/2011   a.) TTE 06/28/2011: EF 55-60%, mild AR, G1DD; b.) TTE 01/16/2013: EF 55-60%, mild MR; c.) TTE 02/16/2018: EF 60-65%, G1DD; d.) MPI 11/26/2018: EF 56%   Abnormal electrocardiogram 11/12/2018   Acute kidney failure ([place]) 01/12/2017   Adenoma of left adrenal gland    Anemia    Anxiety    Aortic atherosclerosis ([place])    Bilateral carpal tunnel syndrome    CAD (coronary artery disease)    a.) HR CT chest 10/30/2021: 2 vessel CAD (LAD/RCA)   CAP (community acquired pneumonia)    COPD (chronic obstructive pulmonary disease) ([place])    COPD exacerbation ([place]) 01/21/2018   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   Diverticulosis    Dyspnea    DUE TO HEAT WITH COPD   Epistaxis 01/09/2017   GERD (gastroesophageal reflux disease)    Herpes zoster 07/08/2015   Hypertension    Hypothyroidism    Iatrogenic hypotension 01/12/2017   Iliac artery stenosis, bilateral (HCC)    Morbid obesity with BMI of 40.0-44.9, adult ([place])    New onset of headaches    Osteopenia    PMR (polymyalgia rheumatica) (HCC)    Pneumonia 02/13/2018   PONV (postoperative nausea and vomiting)    Pulmonary nodule, left  a.) CT chest 10/30/2021: 1.5 cm medial LUL nodule; b.) PET CT 32/35/5732: hypermetabolic LUL nodule (SUX max 9.3)   S/P carpal tunnel release 08/04/2020   Shingles    Stasis dermatitis    Strain of chest wall 01/20/2011   Tobacco abuse    Vitamin D deficiency     Social History   Socioeconomic History   Marital status: Married    Spouse name: [name]   Number of children: 1   Years of education: Not on file   Highest  education level: Not on file  Occupational History   Occupation: Therapist, art as a Therapist, art: RETIRED    Comment: retired  Tobacco Use   Smoking status: Former    Packs/day: 1.50    Years: 20.00    Total pack years: 30.00    Types: Cigarettes    Quit date: 02/11/2009    Years since quitting: 13.0   Smokeless tobacco: Never  Vaping Use   Vaping Use: Never used  Substance and Sexual Activity   Alcohol use: No    Alcohol/week: 0.0 standard drinks of alcohol   Drug use: No   Sexual activity: Not Currently  Other Topics Concern   Not on file  Social History Narrative   Lives in [place] with her husband.  Takes care of her two grandchildren, ages 69 and 2, everyday in [place].  Has one son who is 59.   Desires CPR   Would not want prolonged life support if futile   Social Determinants of Health   Financial Resource Strain: Low Risk  (05/01/2021)   Overall Financial Resource Strain (CARDIA)    Difficulty of Paying Living Expenses: Not hard at all  Food Insecurity: No Food Insecurity (01/25/2022)   Hunger Vital Sign    Worried About Running Out of Food in the Last Year: Never true    Ran Out of Food in the Last Year: Never true  Transportation Needs: No Transportation Needs (01/25/2022)   PRAPARE - Hydrologist (Medical): No    Lack of Transportation (Non-Medical): No  Physical Activity: Sufficiently Active (05/01/2021)   Exercise Vital Sign    Days of Exercise per Week: 5 days    Minutes of Exercise per Session: 30 min  Stress: No Stress Concern Present (05/01/2021)   [place]    Feeling of Stress : Not at all  Social Connections: [place] (05/01/2021)   Social Connection and Isolation Panel [NHANES]    Frequency of Communication with Friends and Family: More than three times a week    Frequency of Social Gatherings with Friends and Family: More than three  times a week    Attends Religious Services: More than 4 times per year    Active Member of Clubs or Organizations: Yes    Attends Archivist Meetings: More than 4 times per year    Marital Status: Married  Human resources officer Violence: Not At Risk (01/25/2022)   Humiliation, Afraid, Rape, and Kick questionnaire    Fear of Current or Ex-Partner: No    Emotionally Abused: No    Physically Abused: No    Sexually Abused: No    Past Surgical History:  Procedure Laterality Date   ANTERIOR CERVICAL DECOMP/DISCECTOMY FUSION  06/22/2004   C4-C6   APPENDECTOMY     CARPAL TUNNEL RELEASE Left 08/31/2019   Procedure: CARPAL TUNNEL RELEASE;  Surgeon: Dereck Leep, MD;  Location: [facility];  Service: Orthopedics;  Laterality: Left;   CARPAL TUNNEL RELEASE Right 06/22/2020   Procedure: CARPAL TUNNEL RELEASE;  Surgeon: Dereck Leep, MD;  Location: [facility];  Service: Orthopedics;  Laterality: Right;   CHOLECYSTECTOMY     COLONOSCOPY     ENDOBRONCHIAL ULTRASOUND Left 01/17/2022   Procedure: ENDOBRONCHIAL ULTRASOUND;  Surgeon: Tyler Pita, MD;  Location: [facility];  Service: Pulmonary;  Laterality: Left;   ESOPHAGOGASTRODUODENOSCOPY     HEMORRHOID SURGERY     TOTAL ABDOMINAL HYSTERECTOMY W/ BILATERAL SALPINGOOPHORECTOMY     TUBAL LIGATION      Family History  Problem Relation Age of Onset   Heart attack Mother 35   Cancer Father        lung/chest wall    Prostate cancer Father    Diabetes Brother        siblings   Hypertension Sister        x 2   Heart disease Brother    Breast cancer Paternal Grandmother    Heart disease Sister    Diabetes Sister    Tuberculosis Sister    Cirrhosis Brother     Allergies  Allergen Reactions   Epinephrine Anaphylaxis and Shortness Of Breath   Food Rash    MANGO = cause rashes around the mouth LIPS GO NUMB   Mangifera Indica Rash    THIS IS MANGO LIPS GO NUMB   Other Rash    MANGO = cause rashes around the mouth MANGO = cause  rashes around the mouth LIPS GO NUMB   Prednisone Anaphylaxis, Hives, Shortness Of Breath, Itching and Swelling    The face swells HAS TAKEN CORTISONE SHOTS WITHOUT ISSUE BEFORE   Milk-Related Compounds Diarrhea    DAIRY PRODUCTS ARE A PROBLEM   Pneumococcal 13-Val Conj Vacc Itching and Other (See Comments)    Arm temp elevated at site of injection, red, itching   Prevnar 13 [Pneumococcal 13-Val Conj Vacc] Other (See Comments)    Arm temp elevated at site of injection, red, itching    Current Outpatient Medications on File Prior to Visit  Medication Sig Dispense Refill   amLODipine (NORVASC) 5 MG tablet TAKE 1 TABLET BY MOUTH EVERY DAY FOR BLOOD PRESSURE 90 tablet 2   Calcium Carb-Cholecalciferol (CALCIUM + VITAMIN D3 PO) Take 1,200 mg by mouth daily.     famotidine (PEPCID) 20 MG tablet One after supper 30 tablet 11   Glycopyrrolate-Formoterol (BEVESPI AEROSPHERE) 9-4.8 MCG/ACT AERO Inhale 2 puffs into the lungs 2 (two) times daily. 1 each 11   hydroxychloroquine (PLAQUENIL) 200 MG tablet TAKE 1 TABLET BY MOUTH TWICE A DAY 180 tablet 1   levothyroxine (SYNTHROID) 75 MCG tablet TAKE 1 EVERY AM ON AN EMPTY STOMACH WITH WATER ONLY. NO FOOD OR OTHER MEDICATIONS FOR 30 MINUTES. 90 tablet 0   losartan (COZAAR) 100 MG tablet TAKE 1 TABLET BY MOUTH EVERY DAY 90 tablet 2   Multiple Vitamins-Minerals (PRESERVISION AREDS 2 PO) Take 1 tablet by mouth in the morning and at bedtime.     tacrolimus (PROTOPIC) 0.1 % ointment Apply to aa's lower legs BID PRN. 60 g 3   No current facility-administered medications on file prior to visit.    BP 122/64   Pulse 76   Temp 97.6 F (36.4 C) (Temporal)   Ht 4\' 9"  (1.448 m)   Wt 185 lb (83.9 kg)   SpO2 94%   BMI 40.03 kg/m  Objective:   Physical Exam Eyes:  Conjunctiva/sclera: Conjunctivae normal.  Neck:     Thyroid: No thyromegaly.  Cardiovascular:     Rate and Rhythm: Normal rate and regular rhythm.     Heart sounds: No murmur  heard. Pulmonary:     Effort: Pulmonary effort is normal.     Breath sounds: Normal breath sounds. No rales.  Musculoskeletal:        General: Normal range of motion.     Cervical back: Neck supple.  Lymphadenopathy:     Cervical: No cervical adenopathy.  Skin:    General: Skin is warm and dry.     Findings: No rash.  Neurological:     Mental Status: She is alert and oriented to person, place, and time.     Deep Tendon Reflexes: Reflexes are normal and symmetric.  Psychiatric:        Mood and Affect: Mood normal.           Assessment & Plan:  Essential hypertension Assessment & Plan: Controlled.  Continue amlodipine 5 mg daily and losartan 100 mg daily. Reviewed CMP from December 2023.  Orders: -     Lipid panel  Estrogen deficiency -     DG Bone Density; Future  Screening mammogram for breast cancer -     3D Screening Mammogram, Left and Right; Future  COPD GOLD 2  Assessment & Plan: Following with pulmonology and radiation oncology.  Continue Bevespi 9-4.8 mcg inhaler. Reviewed pulmonology notes from February 2024.   Gastroesophageal reflux disease, unspecified whether esophagitis present Assessment & Plan: Controlled.  Continue famotidine 20 mg HS.   Other specified hypothyroidism Assessment & Plan: She is taking levothyroxine correctly.  Continue levothyroxine 75 mcg daily. Repeat TSH pending.   Orders: -     TSH  Screening for colon cancer -     Ambulatory referral to Gastroenterology  Osteoporosis, unspecified osteoporosis type, unspecified pathological fracture presence Assessment & Plan: No longer on bisphosphonate treatment per pulmonary.   Repeat bone density scan ordered and pending Continue vitamin D and calcium.      Personal history of colonic polyps Assessment & Plan: Colonoscopy referral placed, especially in the setting of newly diagnosed lung cancer.   Prediabetes Assessment & Plan: Repeat A1C  pending.  Orders: -     Hemoglobin A1c  Chronic heart failure with preserved ejection fraction (HFpEF) ([place]) Assessment & Plan: Appears euvolemic today. Reviewed cardiology notes from November 2023.   Lipodermatosclerosis, unspecified laterality Assessment & Plan: Following with dermatology and ophthalmology. Continue Plaquenil 200 mg BID Reviewed office notes from November 2023.          Pleas Koch, NP

## 2022-02-22 NOTE — Assessment & Plan Note (Signed)
Appears euvolemic today. Reviewed cardiology notes from November 2023.

## 2022-02-22 NOTE — Assessment & Plan Note (Signed)
Colonoscopy referral placed, especially in the setting of newly diagnosed lung cancer.

## 2022-02-22 NOTE — Assessment & Plan Note (Signed)
Controlled.  Continue amlodipine 5 mg daily and losartan 100 mg daily. Reviewed CMP from December 2023.

## 2022-02-22 NOTE — Assessment & Plan Note (Signed)
Controlled.  Continue famotidine 20 mg HS.

## 2022-02-22 NOTE — Assessment & Plan Note (Signed)
She is taking levothyroxine correctly.  Continue levothyroxine 75 mcg daily. Repeat TSH pending. 

## 2022-02-22 NOTE — Assessment & Plan Note (Signed)
Following with dermatology and ophthalmology. Continue Plaquenil 200 mg BID Reviewed office notes from November 2023.

## 2022-02-22 NOTE — Assessment & Plan Note (Signed)
No longer on bisphosphonate treatment per pulmonary.   Repeat bone density scan ordered and pending Continue vitamin D and calcium.

## 2022-02-22 NOTE — Patient Instructions (Signed)
Stop by the lab prior to leaving today. I will notify you of your results once received.   [facility] to schedule your mammogram and bone density testing.   You will either be contacted via phone regarding your referral to GI for the colonoscopy, or you may receive a letter on your MyChart portal from our referral team with instructions for scheduling an appointment. Please let us know if you have not been contacted by anyone within two weeks.  It was a pleasure to see you today!

## 2022-02-22 NOTE — Assessment & Plan Note (Signed)
Following with pulmonology and radiation oncology.  Continue Bevespi 9-4.8 mcg inhaler. Reviewed pulmonology notes from February 2024.

## 2022-02-23 ENCOUNTER — Telehealth: Payer: Self-pay | Admitting: Internal Medicine

## 2022-02-23 ENCOUNTER — Telehealth: Payer: Self-pay

## 2022-02-23 ENCOUNTER — Telehealth: Payer: Self-pay | Admitting: Gastroenterology

## 2022-02-23 ENCOUNTER — Other Ambulatory Visit: Payer: Self-pay

## 2022-02-23 ENCOUNTER — Telehealth: Payer: Self-pay | Admitting: *Deleted

## 2022-02-23 DIAGNOSIS — Z1211 Encounter for screening for malignant neoplasm of colon: Secondary | ICD-10-CM

## 2022-02-23 MED ORDER — PEG 3350-KCL-NA BICARB-NACL 420 G PO SOLR: 4000.0000 mL | Freq: Once | ORAL | 0 refills | Status: AC

## 2022-02-23 NOTE — Telephone Encounter (Signed)
   Pre-operative Risk Assessment    Patient Name: Teresa Jensen  DOB: April 16, 1945 MRN: [id number]      Request for Surgical Clearance    Procedure:   colonoscopy  Date of Surgery:  Clearance 03/26/22                                 Surgeon:  not indicated  Surgeon's Group or Practice Name:  [facility] Phone number:  [phone] Fax number:  [phone]   Type of Clearance Requested:   - Medical    Type of Anesthesia:  General    Additional requests/questions:    Manfred Arch   02/23/2022, 1:52 PM

## 2022-02-23 NOTE — Telephone Encounter (Signed)
Pt has been scheduled for tele pre op appt 03/09/22 @ 9:20. Med rec and consent are done.

## 2022-02-23 NOTE — Telephone Encounter (Signed)
   Name: Teresa Jensen  DOB: Jul 21, 1945  MRN: [id number]  Primary Cardiologist: Nelva Bush, MD   Preoperative team, please contact this patient and set up a phone call appointment for further preoperative risk assessment. Please obtain consent and complete medication review. Thank you for your help.   Mayra Reel, NP 02/23/2022, 1:59 PM [place]

## 2022-02-23 NOTE — Telephone Encounter (Signed)
I left a message to call back for tele pre op appt.

## 2022-02-23 NOTE — Telephone Encounter (Signed)
Gastroenterology Pre-Procedure Review  Request Date: 03/26/22 Requesting Physician: Dr. Vicente Males  PATIENT REVIEW QUESTIONS: The patient responded to the following health history questions as indicated:    1. Are you having any GI issues? no 2. Do you have a personal history of Polyps? No last colonoscopy 06/29/2011 3. Do you have a family history of Colon Cancer or Polyps? no 4. Diabetes Mellitus? no 5. Joint replacements in the past 12 months? No but lung biopsy 6. Major health problems in the past 3 months?no 7. Any artificial heart valves, MVP, or defibrillator?no    MEDICATIONS & ALLERGIES:    Patient reports the following regarding taking any anticoagulation/antiplatelet therapy:   Plavix, Coumadin, Eliquis, Xarelto, Lovenox, Pradaxa, Brilinta, or Effient? no Aspirin? no  Patient confirms/reports the following medications:  Current Outpatient Medications  Medication Sig Dispense Refill   amLODipine (NORVASC) 5 MG tablet TAKE 1 TABLET BY MOUTH EVERY DAY FOR BLOOD PRESSURE 90 tablet 2   Calcium Carb-Cholecalciferol (CALCIUM + VITAMIN D3 PO) Take 1,200 mg by mouth daily.     famotidine (PEPCID) 20 MG tablet One after supper 30 tablet 11   Glycopyrrolate-Formoterol (BEVESPI AEROSPHERE) 9-4.8 MCG/ACT AERO Inhale 2 puffs into the lungs 2 (two) times daily. 1 each 11   hydroxychloroquine (PLAQUENIL) 200 MG tablet TAKE 1 TABLET BY MOUTH TWICE A DAY 180 tablet 1   levothyroxine (SYNTHROID) 75 MCG tablet TAKE 1 EVERY AM ON AN EMPTY STOMACH WITH WATER ONLY. NO FOOD OR OTHER MEDICATIONS FOR 30 MINUTES. 90 tablet 0   losartan (COZAAR) 100 MG tablet TAKE 1 TABLET BY MOUTH EVERY DAY 90 tablet 2   Multiple Vitamins-Minerals (PRESERVISION AREDS 2 PO) Take 1 tablet by mouth in the morning and at bedtime.     tacrolimus (PROTOPIC) 0.1 % ointment Apply to aa's lower legs BID PRN. 60 g 3   No current facility-administered medications for this visit.    Patient confirms/reports the following  allergies:  Allergies  Allergen Reactions   Epinephrine Anaphylaxis and Shortness Of Breath   Food Rash    MANGO = cause rashes around the mouth LIPS GO NUMB   Mangifera Indica Rash    THIS IS MANGO LIPS GO NUMB   Other Rash    MANGO = cause rashes around the mouth MANGO = cause rashes around the mouth LIPS GO NUMB   Prednisone Anaphylaxis, Hives, Shortness Of Breath, Itching and Swelling    The face swells HAS TAKEN CORTISONE SHOTS WITHOUT ISSUE BEFORE   Milk-Related Compounds Diarrhea    DAIRY PRODUCTS ARE A PROBLEM   Pneumococcal 13-Val Conj Vacc Itching and Other (See Comments)    Arm temp elevated at site of injection, red, itching   Prevnar 13 [Pneumococcal 13-Val Conj Vacc] Other (See Comments)    Arm temp elevated at site of injection, red, itching    No orders of the defined types were placed in this encounter.   AUTHORIZATION INFORMATION Primary Insurance: 1D#: Group #:  Secondary Insurance: 1D#: Group #:  SCHEDULE INFORMATION: Date: 03/26/22 Time: Location: [facility]

## 2022-02-23 NOTE — Telephone Encounter (Signed)
Pt returned call to schedule colonoscopy call back [phone]

## 2022-02-23 NOTE — Telephone Encounter (Signed)
Patient was returning call. Please advise  

## 2022-02-23 NOTE — Telephone Encounter (Signed)
Pt has been scheduled for tele pre op appt 03/09/22 @ 9:20. Med rec and consent are done.      Patient Consent for Virtual Visit        Karlette Cantwell Lackman has provided verbal consent on 02/23/2022 for a virtual visit (video or telephone).   CONSENT FOR VIRTUAL VISIT FOR:  Teresa Jensen  By participating in this virtual visit I agree to the following:  I hereby voluntarily request, consent and authorize [facility] and its employed or contracted physicians, physician assistants, nurse practitioners or other licensed health care professionals (the Practitioner), to provide me with telemedicine health care services (the "Services") as deemed necessary by the treating Practitioner. I acknowledge and consent to receive the Services by the Practitioner via telemedicine. I understand that the telemedicine visit will involve communicating with the Practitioner through live audiovisual communication technology and the disclosure of certain medical information by electronic transmission. I acknowledge that I have been given the opportunity to request an in-person assessment or other available alternative prior to the telemedicine visit and am voluntarily participating in the telemedicine visit.  I understand that I have the right to withhold or withdraw my consent to the use of telemedicine in the course of my care at any time, without affecting my right to future care or treatment, and that the Practitioner or I may terminate the telemedicine visit at any time. I understand that I have the right to inspect all information obtained and/or recorded in the course of the telemedicine visit and may receive copies of available information for a reasonable fee.  I understand that some of the potential risks of receiving the Services via telemedicine include:  Delay or interruption in medical evaluation due to technological equipment failure or disruption; Information transmitted may not be sufficient  (e.g. poor resolution of images) to allow for appropriate medical decision making by the Practitioner; and/or  In rare instances, security protocols could fail, causing a breach of personal health information.  Furthermore, I acknowledge that it is my responsibility to provide information about my medical history, conditions and care that is complete and accurate to the best of my ability. I acknowledge that Practitioner's advice, recommendations, and/or decision may be based on factors not within their control, such as incomplete or inaccurate data provided by me or distortions of diagnostic images or specimens that may result from electronic transmissions. I understand that the practice of medicine is not an exact science and that Practitioner makes no warranties or guarantees regarding treatment outcomes. I acknowledge that a copy of this consent can be made available to me via my patient portal ([facility]), or I can request a printed copy by calling the office of [facility].    I understand that my insurance will be billed for this visit.   I have read or had this consent read to me. I understand the contents of this consent, which adequately explains the benefits and risks of the Services being provided via telemedicine.  I have been provided ample opportunity to ask questions regarding this consent and the Services and have had my questions answered to my satisfaction. I give my informed consent for the services to be provided through the use of telemedicine in my medical care

## 2022-02-24 ENCOUNTER — Other Ambulatory Visit: Payer: Self-pay | Admitting: Primary Care

## 2022-02-24 DIAGNOSIS — I10 Essential (primary) hypertension: Secondary | ICD-10-CM

## 2022-02-26 ENCOUNTER — Ambulatory Visit
Admission: RE | Admit: 2022-02-26 | Discharge: 2022-02-26 | Disposition: A | Discharge status: Home / Self Care | Payer: Medicare Other | Source: Ambulatory Visit | Attending: Radiation Oncology | Admitting: Radiation Oncology

## 2022-02-26 ENCOUNTER — Other Ambulatory Visit: Payer: Self-pay

## 2022-02-26 DIAGNOSIS — Z510 Encounter for antineoplastic radiation therapy: Secondary | ICD-10-CM | POA: Diagnosis not present

## 2022-02-26 LAB — RAD ONC ARIA SESSION SUMMARY
Course Elapsed Days: 13
Plan Fractions Treated to Date: 5
Plan Prescribed Dose Per Fraction: 12 Gy
Plan Total Fractions Prescribed: 5
Plan Total Prescribed Dose: 60 Gy
Reference Point Dosage Given to Date: 60 Gy
Reference Point Session Dosage Given: 12 Gy
Session Number: 5

## 2022-03-06 NOTE — Progress Notes (Signed)
Virtual Visit via Telephone Note   Because of Teresa Jensen's co-morbid illnesses, she is at least at moderate risk for complications without adequate follow up.  This format is felt to be most appropriate for this patient at this time.  The patient did not have access to video technology/had technical difficulties with video requiring transitioning to audio format only (telephone).  All issues noted in this document were discussed and addressed.  No physical exam could be performed with this format.  Please refer to the patient's chart for her consent to telehealth for [facility].  Evaluation Performed:  Preoperative cardiovascular risk assessment _____________   Date:  03/06/2022   Patient ID:  Teresa Jensen, DOB 10-Oct-1945, MRN [id number] Patient Location:  Home Provider location:   Office  Primary Care Provider:  Pleas Koch, NP Primary Cardiologist:  Nelva Bush, MD  Chief Complaint / Patient Profile   77 y.o. y/o female with a h/o HFpEF, HTN, COPD, polymyalgia rheumatica, lung cancer, GERD who is pending colonoscopy and presents today for telephonic preoperative cardiovascular risk assessment.  History of Present Illness    Teresa Jensen is a 77 y.o. female who presents via audio/video conferencing for a telehealth visit today.  Pt was last seen in cardiology clinic on 11/01/21 by Cadence Kathlen Mody, PA.  At that time Teresa Jensen was doing well.  The patient is now pending procedure as outlined above. Since her last visit, she denies chest pain, lower extremity edema, fatigue, palpitations, melena, hematuria, hemoptysis, diaphoresis, weakness, presyncope, syncope, orthopnea, and PND. She is somewhat limited by COPD and has chronic but stable shortness of breath but remains active around her home. She is able to achieve > 4 METS activity without concerning cardiac symptoms.    Past Medical History    Past Medical History:  Diagnosis Date   (HFpEF)  heart failure with preserved ejection fraction ([place]) 06/28/2011   a.) TTE 06/28/2011: EF 55-60%, mild AR, G1DD; b.) TTE 01/16/2013: EF 55-60%, mild MR; c.) TTE 02/16/2018: EF 60-65%, G1DD; d.) MPI 11/26/2018: EF 56%   Abnormal electrocardiogram 11/12/2018   Acute kidney failure ([place]) 01/12/2017   Adenoma of left adrenal gland    Anemia    Anxiety    Aortic atherosclerosis ([place])    Bilateral carpal tunnel syndrome    CAD (coronary artery disease)    a.) HR CT chest 10/30/2021: 2 vessel CAD (LAD/RCA)   CAP (community acquired pneumonia)    COPD (chronic obstructive pulmonary disease) ([place])    COPD exacerbation ([place]) 01/21/2018   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   Diverticulosis    Dyspnea    DUE TO HEAT WITH COPD   Epistaxis 01/09/2017   GERD (gastroesophageal reflux disease)    Herpes zoster 07/08/2015   Hypertension    Hypothyroidism    Iatrogenic hypotension 01/12/2017   Iliac artery stenosis, bilateral (HCC)    Morbid obesity with BMI of 40.0-44.9, adult ([place])    New onset of headaches    Osteopenia    PMR (polymyalgia rheumatica) (HCC)    Pneumonia 02/13/2018   PONV (postoperative nausea and vomiting)    Pulmonary nodule, left    a.) CT chest 10/30/2021: 1.5 cm medial LUL nodule; b.) PET CT [id number]: hypermetabolic LUL nodule (SUX max 9.3)   S/P carpal tunnel release 08/04/2020   Shingles    Stasis dermatitis    Strain of chest wall 01/20/2011   Tobacco abuse    Vitamin D deficiency  Past Surgical History:  Procedure Laterality Date   ANTERIOR CERVICAL DECOMP/DISCECTOMY FUSION  06/22/2004   C4-C6   APPENDECTOMY     CARPAL TUNNEL RELEASE Left 08/31/2019   Procedure: CARPAL TUNNEL RELEASE;  Surgeon: Dereck Leep, MD;  Location: [facility] ORS;  Service: Orthopedics;  Laterality: Left;   CARPAL TUNNEL RELEASE Right 06/22/2020   Procedure: CARPAL TUNNEL RELEASE;  Surgeon: Dereck Leep, MD;  Location: [facility] ORS;  Service: Orthopedics;  Laterality: Right;    CHOLECYSTECTOMY     COLONOSCOPY     ENDOBRONCHIAL ULTRASOUND Left 01/17/2022   Procedure: ENDOBRONCHIAL ULTRASOUND;  Surgeon: Tyler Pita, MD;  Location: [facility] ORS;  Service: Pulmonary;  Laterality: Left;   ESOPHAGOGASTRODUODENOSCOPY     HEMORRHOID SURGERY     TOTAL ABDOMINAL HYSTERECTOMY W/ BILATERAL SALPINGOOPHORECTOMY     TUBAL LIGATION      Allergies  Allergies  Allergen Reactions   Epinephrine Anaphylaxis and Shortness Of Breath   Food Rash    MANGO = cause rashes around the mouth LIPS GO NUMB   Mangifera [employer]ica Rash    THIS IS MANGO LIPS GO NUMB   Other Rash    MANGO = cause rashes around the mouth MANGO = cause rashes around the mouth LIPS GO NUMB   Prednisone Anaphylaxis, Hives, Shortness Of Breath, Itching and Swelling    The face swells HAS TAKEN CORTISONE SHOTS WITHOUT ISSUE BEFORE   Milk-Related Compounds Diarrhea    DAIRY PRODUCTS ARE A PROBLEM   Pneumococcal 13-Val Conj Vacc Itching and Other (See Comments)    Arm temp elevated at site of injection, red, itching   Prevnar 13 [Pneumococcal 13-Val Conj Vacc] Other (See Comments)    Arm temp elevated at site of injection, red, itching    Home Medications    Prior to Admission medications   Medication Sig Start Date End Date Taking? Authorizing Provider  amLODipine (NORVASC) 5 MG tablet TAKE 1 TABLET BY MOUTH EVERY DAY FOR BLOOD PRESSURE 02/24/22   Pleas Koch, NP  Calcium Carb-Cholecalciferol (CALCIUM + VITAMIN D3 PO) Take 1,200 mg by mouth daily.    [provider]  famotidine (PEPCID) 20 MG tablet One after supper 09/07/21   Tanda Rockers, MD  Glycopyrrolate-Formoterol (BEVESPI AEROSPHERE) 9-4.8 MCG/ACT AERO Inhale 2 puffs into the lungs 2 (two) times daily. 10/19/21   Tanda Rockers, MD  hydroxychloroquine (PLAQUENIL) 200 MG tablet TAKE 1 TABLET BY MOUTH TWICE A DAY 11/27/21   Moye, [place], MD  levothyroxine (SYNTHROID) 75 MCG tablet TAKE 1 EVERY AM ON AN EMPTY STOMACH WITH WATER ONLY.  NO FOOD OR OTHER MEDICATIONS FOR 30 MINUTES. 02/19/22   Pleas Koch, NP  losartan (COZAAR) 100 MG tablet TAKE 1 TABLET BY MOUTH EVERY DAY 11/27/21   End, Harrell Gave, MD  Multiple Vitamins-Minerals (PRESERVISION AREDS 2 PO) Take 1 tablet by mouth in the morning and at bedtime.    [provider]  tacrolimus (PROTOPIC) 0.1 % ointment Apply to aa's lower legs BID PRN. 12/06/20   Alfonso Patten, MD    Physical Exam    Vital Signs:  Teresa Jensen does not have vital signs available for review today.  Given telephonic nature of communication, physical exam is limited. AAOx3. NAD. Normal affect.  Speech and respirations are unlabored.  Accessory Clinical Findings    None  Assessment & Plan    1.  Preoperative Cardiovascular Risk Assessment:The patient is doing well from a cardiac perspective. Therefore, based on ACC/AHA guidelines, the  patient would be at acceptable risk for the planned procedure without further cardiovascular testing. According to the Revised Cardiac Risk Index (RCRI), her Perioperative Risk of Major Cardiac Event is (%): 0.9. Her Functional Capacity in METs is: 6.05 according to the [facility] Activity Status Index (DASI).  The patient was advised that if she develops new symptoms prior to surgery to contact our office to arrange for a follow-up visit, and she verbalized understanding.   A copy of this note will be routed to requesting surgeon.  Time:   Today, I have spent 10 minutes with the patient with telehealth technology discussing medical history, symptoms, and management plan.     Emmaline Life, NP-C  03/09/2022, 9:16 AM [address], [address] Office [phone] Fax [phone]

## 2022-03-07 ENCOUNTER — Telehealth: Payer: Self-pay | Admitting: Primary Care

## 2022-03-07 NOTE — Telephone Encounter (Signed)
Pt called requesting a call back from nurse regarding a upcoming coloscopy she's having soon. Pt states she has some worries & concerns about being put asleep for procedure due to her recently having cancer. Call back # [phone]

## 2022-03-08 NOTE — Telephone Encounter (Signed)
Called and spoke with patient, advised to reach out to GI office, to get the details of the procedure, if she would be put to sleep or not. She has already been in contact with her cardiologist office regarding some questions concerning the procedure as well. Durward Fortes Anda Kraft is out of the office until 03/20/22, she will call the GI office with her concerns and let us know if she needs anything.

## 2022-03-09 ENCOUNTER — Ambulatory Visit: Payer: Medicare Other | Attending: Internal Medicine | Admitting: Nurse Practitioner

## 2022-03-09 ENCOUNTER — Encounter: Payer: Self-pay | Admitting: Nurse Practitioner

## 2022-03-09 ENCOUNTER — Telehealth: Payer: Self-pay | Admitting: *Deleted

## 2022-03-09 DIAGNOSIS — Z01810 Encounter for preprocedural cardiovascular examination: Secondary | ICD-10-CM | POA: Diagnosis not present

## 2022-03-09 NOTE — Telephone Encounter (Signed)
Patient called office and was concern regarding procedure. We discuss the importance of colonoscopy and possible findings with procedure. Patient was concern about putting to sleep and I have ensure her that process is not invasive in anyway. She asked if she got see oncology before the colonoscopy which I told her was fine. We made the change. Also need to note that patient prefer female provider and we had to switch to Dr Marius Ditch.  New instructions will be sent.  Patient verbalized understanding.

## 2022-03-12 ENCOUNTER — Telehealth: Payer: Self-pay

## 2022-03-12 NOTE — Telephone Encounter (Signed)
Cardiac clearance has been granted for pt to have her colonoscopy 04/16/22 with Dr. Marius Ditch.  Clearance received by fax assessed by [facility] provider Christen Bame, NP.  "Patient would be at acceptable risk for the planned procedure without further cardiovascular testing".  Thanks, [place], [place]

## 2022-03-28 ENCOUNTER — Telehealth: Payer: Self-pay | Admitting: Gastroenterology

## 2022-03-28 NOTE — Telephone Encounter (Signed)
Pt called to cancel procedure  and will call back in April and will call back in May to reschedule

## 2022-03-28 NOTE — Telephone Encounter (Signed)
Patient contacted to inquire why she has canceled her 04/16/22 colonoscopy.  She stated that she wants to see her pulmonologist prior to having her colonoscopy but will call back in April or May after she has had her appt.  Thanks,  [place], [place]

## 2022-04-04 ENCOUNTER — Ambulatory Visit (INDEPENDENT_AMBULATORY_CARE_PROVIDER_SITE_OTHER): Payer: Medicare Other | Admitting: Primary Care

## 2022-04-04 ENCOUNTER — Encounter: Payer: Self-pay | Admitting: Primary Care

## 2022-04-04 ENCOUNTER — Ambulatory Visit (INDEPENDENT_AMBULATORY_CARE_PROVIDER_SITE_OTHER)
Admission: RE | Admit: 2022-04-04 | Discharge: 2022-04-04 | Disposition: A | Discharge status: Home / Self Care | Payer: Medicare Other | Source: Ambulatory Visit | Attending: Primary Care | Admitting: Primary Care

## 2022-04-04 VITALS — BP 132/68 | HR 92 | Temp 98.6°F | Ht <= 58 in | Wt 185.0 lb

## 2022-04-04 DIAGNOSIS — K219 Gastro-esophageal reflux disease without esophagitis: Secondary | ICD-10-CM

## 2022-04-04 DIAGNOSIS — R051 Acute cough: Secondary | ICD-10-CM

## 2022-04-04 LAB — POC COVID19 BINAXNOW: SARS Coronavirus 2 Ag: NEGATIVE

## 2022-04-04 LAB — POCT INFLUENZA A/B
Influenza A, POC: NEGATIVE
Influenza B, POC: NEGATIVE

## 2022-04-04 MED ORDER — PANTOPRAZOLE SODIUM 20 MG PO TBEC: 20.0000 mg | DELAYED_RELEASE_TABLET | Freq: Every day | ORAL | 3 refills | Status: DC

## 2022-04-04 MED ORDER — HYDROCOD POLI-CHLORPHE POLI ER 10-8 MG/5ML PO SUER: 5.0000 mL | Freq: Two times a day (BID) | ORAL | 0 refills | Status: DC | PRN

## 2022-04-04 NOTE — Addendum Note (Signed)
Addended by: Pat Kocher on: 04/04/2022 07:43 AM   Modules accepted: Orders

## 2022-04-04 NOTE — Assessment & Plan Note (Signed)
Refills provided for pantoprazole, will resume at 20 mg dose. Consider dose increase to 40 mg if needed.

## 2022-04-04 NOTE — Assessment & Plan Note (Addendum)
Appears viral. Lungs pretty clear upon exam. Given medical history, will rule out pneumonia or other causes. Reviewed vital signs.   Negative influenza and Covid tests today.  Chest xray ordered and pending. Start Tussionex every 12 hours as needed.   Await results.

## 2022-04-04 NOTE — Progress Notes (Signed)
Subjective:    Patient ID: Teresa Jensen, female    DOB: 06-03-1945, 77 y.o.   MRN: [id number]  Cough Associated symptoms include chills. Pertinent negatives include no chest pain, sore throat or shortness of breath.    Teresa Jensen is a very pleasant 77 y.o. 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  Her husband joins [place] today.  Symptom onset two days ago with a scratchy throat. She then developed a dry cough, chills, shortness of breath from her cough, and intermittent ear pain. Her cough is not congested and denies increased mucous production. She's been taking Mucinex and Rx cough syrup with codeine (77 years old), and Tylenol with temporary improvement.   She completed 5 sessions of SBRT per radiology oncology for squamous cell carcinoma of left upper lung in February 2024. She is due for repeat CT around May 2024.  She is also needing a refill of her pantoprazole, has been out for months. Her current cough is worse at night. She hasn't checked her temperature but thinks she may have run a fever due to her chills. She denies increased mucous production, chest tightness, chest pain.    Review of Systems  Constitutional:  Positive for chills and fatigue.  HENT:  Negative for congestion and sore throat.   Respiratory:  Positive for cough. Negative for shortness of breath.   Cardiovascular:  Negative for chest pain.         Past Medical History:  Diagnosis Date   (HFpEF) heart failure with preserved ejection fraction 06/28/2011   a.) TTE 06/28/2011: EF 55-60%, mild AR, G1DD; b.) TTE 01/16/2013: EF 55-60%, mild MR; c.) TTE 02/16/2018: EF 60-65%, G1DD; d.) MPI 11/26/2018: EF 56%   Abnormal electrocardiogram 11/12/2018   Acute kidney failure 01/12/2017   Adenoma of left adrenal gland    Anemia    Anxiety     Aortic atherosclerosis    Bilateral carpal tunnel syndrome    CAD (coronary artery disease)    a.) HR CT chest 10/30/2021: 2 vessel CAD (LAD/RCA)   CAP (community acquired pneumonia)    COPD (chronic obstructive pulmonary disease)    COPD exacerbation 01/21/2018   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   Diverticulosis    Dyspnea    DUE TO HEAT WITH COPD   Epistaxis 01/09/2017   GERD (gastroesophageal reflux disease)    Herpes zoster 07/08/2015   Hypertension    Hypothyroidism    Iatrogenic hypotension 01/12/2017   Iliac artery stenosis, bilateral    Morbid obesity with BMI of 40.0-44.9, adult    New onset of headaches    Osteopenia    PMR (polymyalgia rheumatica)    Pneumonia 02/13/2018   PONV (postoperative nausea and vomiting)    Pulmonary nodule, left    a.) CT chest 10/30/2021: 1.5 cm medial LUL nodule; b.) PET CT [id number]: hypermetabolic LUL nodule (SUX max 9.3)   S/P carpal tunnel release 08/04/2020   Shingles    Stasis dermatitis    Strain of chest wall 01/20/2011   Tobacco abuse    Vitamin D deficiency     Social History   Socioeconomic History   Marital status: Married    Spouse name: [name]   Number of children: 1   Years of education: Not on file   Highest education level: Not on file  Occupational History   Occupation:  [employer] as a Therapist, art: RETIRED    Comment: retired  Tobacco Use   Smoking status: Former    Packs/day: 1.50    Years: 20.00    Additional pack years: 0.00    Total pack years: 30.00    Types: Cigarettes    Quit date: 02/11/2009    Years since quitting: 13.1   Smokeless tobacco: Never  Vaping Use   Vaping Use: Never used  Substance and Sexual Activity   Alcohol use: No    Alcohol/week: 0.0 standard drinks of alcohol   Drug use: No   Sexual activity: Not Currently  Other Topics Concern   Not on file  Social History Narrative   Lives in [place] with her husband.  Takes care of her two  grandchildren, ages 41 and 2, everyday in [place].  Has one son who is 73.   Desires CPR   Would not want prolonged life support if futile   Social Determinants of Health   Financial Resource Strain: Low Risk  (05/01/2021)   Overall Financial Resource Strain (CARDIA)    Difficulty of Paying Living Expenses: Not hard at all  Food Insecurity: No Food Insecurity (01/25/2022)   Hunger Vital Sign    Worried About Running Out of Food in the Last Year: Never true    Ran Out of Food in the Last Year: Never true  Transportation Needs: No Transportation Needs (01/25/2022)   PRAPARE - Hydrologist (Medical): No    Lack of Transportation (Non-Medical): No  Physical Activity: Sufficiently Active (05/01/2021)   Exercise Vital Sign    Days of Exercise per Week: 5 days    Minutes of Exercise per Session: 30 min  Stress: No Stress Concern Present (05/01/2021)   [place]    Feeling of Stress : Not at all  Social Connections: Cyd Hostler (05/01/2021)   Social Connection and Isolation Panel [NHANES]    Frequency of Communication with Friends and Family: More than three times a week    Frequency of Social Gatherings with Friends and Family: More than three times a week    Attends Religious Services: More than 4 times per year    Active Member of Clubs or Organizations: Yes    Attends Archivist Meetings: More than 4 times per year    Marital Status: Married  Human resources officer Violence: Not At Risk (01/25/2022)   Humiliation, Afraid, Rape, and Kick questionnaire    Fear of Current or Ex-Partner: No    Emotionally Abused: No    Physically Abused: No    Sexually Abused: No    Past Surgical History:  Procedure Laterality Date   ANTERIOR CERVICAL DECOMP/DISCECTOMY FUSION  06/22/2004   C4-C6   APPENDECTOMY     CARPAL TUNNEL RELEASE Left 08/31/2019   Procedure: CARPAL TUNNEL RELEASE;  Surgeon:  Dereck Leep, MD;  Location: [facility] OR[facility];  Service: Orthopedics;  Laterality: Left;   CARPAL TUNNEL RELEASE Right 06/22/2020   Procedure: CARPAL TUNNEL RELEASE;  Surgeon: Dereck Leep, MD;  Location: [facility];  Service: Orthopedics;  Laterality: Right;   CHOLECYSTECTOMY     COLONOSCOPY     ENDOBRONCHIAL ULTRASOUND Left 01/17/2022   Procedure: ENDOBRONCHIAL ULTRASOUND;  Surgeon: Tyler Pita, MD;  Location: [facility];  Service: Pulmonary;  Laterality: Left;   ESOPHAGOGASTRODUODENOSCOPY     HEMORRHOID SURGERY     TOTAL ABDOMINAL HYSTERECTOMY W/ BILATERAL  SALPINGOOPHORECTOMY     TUBAL LIGATION      Family History  Problem Relation Age of Onset   Heart attack Mother 70   Cancer Father        lung/chest wall    Prostate cancer Father    Diabetes Brother        siblings   Hypertension Sister        x 2   Heart disease Brother    Breast cancer Paternal Grandmother    Heart disease Sister    Diabetes Sister    Tuberculosis Sister    Cirrhosis Brother     Allergies  Allergen Reactions   Epinephrine Anaphylaxis and Shortness Of Breath   Food Rash    MANGO = cause rashes around the mouth LIPS GO NUMB   Mangifera [employer] Rash    THIS IS MANGO LIPS GO NUMB   Other Rash    MANGO = cause rashes around the mouth MANGO = cause rashes around the mouth LIPS GO NUMB   Prednisone Anaphylaxis, Hives, Shortness Of Breath, Itching and Swelling    The face swells HAS TAKEN CORTISONE SHOTS WITHOUT ISSUE BEFORE   Milk-Related Compounds Diarrhea    DAIRY PRODUCTS ARE A PROBLEM   Pneumococcal 13-Val Conj Vacc Itching and Other (See Comments)    Arm temp elevated at site of injection, red, itching   Prevnar 13 [Pneumococcal 13-Val Conj Vacc] Other (See Comments)    Arm temp elevated at site of injection, red, itching    Current Outpatient Medications on File Prior to Visit  Medication Sig Dispense Refill   amLODipine (NORVASC) 5 MG tablet TAKE 1 TABLET BY MOUTH EVERY DAY FOR BLOOD  PRESSURE 90 tablet 3   Calcium Carb-Cholecalciferol (CALCIUM + VITAMIN D3 PO) Take 1,200 mg by mouth daily.     famotidine (PEPCID) 20 MG tablet One after supper 30 tablet 11   Glycopyrrolate-Formoterol (BEVESPI AEROSPHERE) 9-4.8 MCG/ACT AERO Inhale 2 puffs into the lungs 2 (two) times daily. 1 each 11   hydroxychloroquine (PLAQUENIL) 200 MG tablet TAKE 1 TABLET BY MOUTH TWICE A DAY 180 tablet 1   levothyroxine (SYNTHROID) 75 MCG tablet TAKE 1 EVERY AM ON AN EMPTY STOMACH WITH WATER ONLY. NO FOOD OR OTHER MEDICATIONS FOR 30 MINUTES. 90 tablet 0   losartan (COZAAR) 100 MG tablet TAKE 1 TABLET BY MOUTH EVERY DAY 90 tablet 2   Multiple Vitamins-Minerals (PRESERVISION AREDS 2 PO) Take 1 tablet by mouth in the morning and at bedtime.     tacrolimus (PROTOPIC) 0.1 % ointment Apply to aa's lower legs BID PRN. 60 g 3   No current facility-administered medications on file prior to visit.    BP 132/68   Pulse 92   Temp 98.6 F (37 C) (Temporal)   Ht 4\' 9"  (1.448 m)   Wt 185 lb (83.9 kg)   SpO2 92%   BMI 40.03 kg/m  Objective:   Physical Exam Constitutional:      Appearance: She is ill-appearing.  HENT:     Right Ear: Tympanic membrane and ear canal normal.     Left Ear: Tympanic membrane and ear canal normal.     Nose:     Right Sinus: No maxillary sinus tenderness or frontal sinus tenderness.     Left Sinus: No maxillary sinus tenderness or frontal sinus tenderness.     Mouth/Throat:     Pharynx: No posterior oropharyngeal erythema.  Eyes:     Conjunctiva/sclera: Conjunctivae normal.  Cardiovascular:  Rate and Rhythm: Normal rate and regular rhythm.  Pulmonary:     Effort: Pulmonary effort is normal.     Breath sounds: Normal breath sounds. No wheezing or rales.     Comments: Dry cough noted once during visit  Musculoskeletal:     Cervical back: Neck supple.  Lymphadenopathy:     Cervical: No cervical adenopathy.  Skin:    General: Skin is warm and dry.            Assessment & Plan:  Acute cough Assessment & Plan: Appears viral. Lungs pretty clear upon exam. Given medical history, will rule out pneumonia or other causes. Reviewed vital signs.   Negative influenza and Covid tests today.  Chest xray ordered and pending. Start Tussionex every 12 hours as needed.   Await results.   Orders: -     DG Chest 2 View -     Hydrocod Poli-Chlorphe Poli ER; Take 5 mLs by mouth every 12 (twelve) hours as needed for cough.  Dispense: 50 mL; Refill: 0  Gastroesophageal reflux disease, unspecified whether esophagitis present Assessment & Plan: Refills provided for pantoprazole, will resume at 20 mg dose. Consider dose increase to 40 mg if needed.  Orders: -     Pantoprazole Sodium; Take 1 tablet (20 mg total) by mouth daily. for heartburn.  Dispense: 90 tablet; Refill: [place], NP

## 2022-04-06 ENCOUNTER — Other Ambulatory Visit: Payer: Self-pay | Admitting: Primary Care

## 2022-04-06 DIAGNOSIS — R051 Acute cough: Secondary | ICD-10-CM

## 2022-04-06 MED ORDER — AZITHROMYCIN 250 MG PO TABS: ORAL_TABLET | ORAL | 0 refills | Status: DC

## 2022-04-10 ENCOUNTER — Ambulatory Visit: Payer: Medicare Other | Admitting: Dermatology

## 2022-04-11 ENCOUNTER — Encounter: Payer: Self-pay | Admitting: Radiation Oncology

## 2022-04-11 ENCOUNTER — Other Ambulatory Visit: Payer: Self-pay | Admitting: *Deleted

## 2022-04-11 ENCOUNTER — Ambulatory Visit
Admission: RE | Admit: 2022-04-11 | Discharge: 2022-04-11 | Disposition: A | Discharge status: Home / Self Care | Payer: Medicare Other | Source: Ambulatory Visit | Attending: Radiation Oncology | Admitting: Radiation Oncology

## 2022-04-11 VITALS — BP 137/59 | HR 85 | Temp 97.8°F | Resp 20 | Ht <= 58 in | Wt 184.0 lb

## 2022-04-11 DIAGNOSIS — C3412 Malignant neoplasm of upper lobe, left bronchus or lung: Secondary | ICD-10-CM | POA: Diagnosis present

## 2022-04-11 NOTE — Progress Notes (Signed)
Radiation Oncology Follow up Note  Name: Teresa Jensen   Date:   04/11/2022 MRN:  [id number] DOB: [birth year]    This 77 y.o. female presents to the clinic today for 1 month follow-up status post SBRT to her left upper lobe for stage I squamous cell carcinoma.  REFERRING PROVIDER: Doreene Nest, NP  HPI: Patient is a 77 year old female now out 1 month having completed SBRT to her left upper lobe for stage I squamous cell carcinoma.  Seen today in routine follow-up she is doing well.  She specifically denies any change in pulmonary status.  Mild cough no hemoptysis or chest tightness.  Energy level is good..  COMPLICATIONS OF TREATMENT: none  FOLLOW UP COMPLIANCE: keeps appointments   PHYSICAL EXAM:  BP (!) 137/59   Pulse 85   Temp 97.8 F (36.6 C) (Tympanic)   Resp 20   Ht 4\' 9"  (1.448 m)   Wt 184 lb (83.5 kg)   BMI 39.82 kg/m  Well-developed well-nourished patient in NAD. HEENT reveals PERLA, EOMI, discs not visualized.  Oral cavity is clear. No oral mucosal lesions are identified. Neck is clear without evidence of cervical or supraclavicular adenopathy. Lungs are clear to A&P. Cardiac examination is essentially unremarkable with regular rate and rhythm without murmur rub or thrill. Abdomen is benign with no organomegaly or masses noted. Motor sensory and DTR levels are equal and symmetric in the upper and lower extremities. Cranial nerves II through XII are grossly intact. Proprioception is intact. No peripheral adenopathy or edema is identified. No motor or sensory levels are noted. Crude visual fields are within normal range.  RADIOLOGY RESULTS: CT scan ordered  PLAN: Present time patient is doing well with a very low side effect profile.  On pleased with her overall progress.  I have asked to see her back in 3 months with a follow-up CT scan at that time.  Patient comprehends my treatment plan well.  I would like to take this opportunity to thank you for allowing me to  participate in the care of your patient.Carmina Miller, MD

## 2022-04-16 ENCOUNTER — Ambulatory Visit: Admit: 2022-04-16 | Payer: Medicare Other | Admitting: Gastroenterology

## 2022-04-16 SURGERY — COLONOSCOPY WITH PROPOFOL
Anesthesia: General

## 2022-05-07 ENCOUNTER — Ambulatory Visit (INDEPENDENT_AMBULATORY_CARE_PROVIDER_SITE_OTHER): Payer: Medicare Other | Admitting: Pulmonary Disease

## 2022-05-07 ENCOUNTER — Encounter: Payer: Self-pay | Admitting: Pulmonary Disease

## 2022-05-07 ENCOUNTER — Ambulatory Visit (INDEPENDENT_AMBULATORY_CARE_PROVIDER_SITE_OTHER): Payer: Medicare Other

## 2022-05-07 VITALS — Ht <= 58 in | Wt 180.0 lb

## 2022-05-07 VITALS — BP 118/72 | HR 92 | Temp 97.3°F | Ht <= 58 in | Wt 186.4 lb

## 2022-05-07 DIAGNOSIS — J8410 Pulmonary fibrosis, unspecified: Secondary | ICD-10-CM | POA: Diagnosis not present

## 2022-05-07 DIAGNOSIS — J449 Chronic obstructive pulmonary disease, unspecified: Secondary | ICD-10-CM | POA: Diagnosis not present

## 2022-05-07 DIAGNOSIS — C3412 Malignant neoplasm of upper lobe, left bronchus or lung: Secondary | ICD-10-CM

## 2022-05-07 DIAGNOSIS — Z0000 Encounter for general adult medical examination without abnormal findings: Secondary | ICD-10-CM | POA: Diagnosis not present

## 2022-05-07 DIAGNOSIS — Z1211 Encounter for screening for malignant neoplasm of colon: Secondary | ICD-10-CM

## 2022-05-07 DIAGNOSIS — J9611 Chronic respiratory failure with hypoxia: Secondary | ICD-10-CM

## 2022-05-07 NOTE — Patient Instructions (Addendum)
You qualified for oxygen today.  We will send an order to [employer] for a portable source for you.  And there will be an overnight study to check your oxygen level without the oxygen to see how you do.  When you are sitting quietly or watching TV or reading a book you may be without the oxygen.  You should have the oxygen when you are walking about.  I recommend that you get an oxygen meter you can get these through [facility] or [facility].  He is already good to have to keep an eye on your oxygen levels.  With your breathing problems and your issues after anesthesia from the bronchoscopy, I would recommend that we hold off on colonoscopies for now.  You may consider the Cologuard test.  We will see you in follow-up in 3 to 4 months time call sooner should any new problems arise.

## 2022-05-07 NOTE — Progress Notes (Signed)
I connected with  Teresa Jensen on 05/07/22 by a audio enabled telemedicine application and verified that I am speaking with the correct person using two identifiers.  Patient Location: Home  Provider Location: Home Office  I discussed the limitations of evaluation and management by telemedicine. The patient expressed understanding and agreed to proceed.  Subjective:   Teresa Jensen is a 77 y.o. female who presents for Medicare Annual (Subsequent) preventive examination.  Review of Systems      Cardiac Risk Factors include: advanced age (>35men, >32 women);hypertension;sedentary lifestyle     Objective:    Today's Vitals   05/07/22 1322  Weight: 180 lb (81.6 kg)  Height: 4\' 9"  (1.448 m)   Body mass index is 38.95 kg/m.     05/07/2022    1:36 PM 04/11/2022    9:13 AM 01/25/2022    8:40 AM 01/17/2022   11:16 AM 01/08/2022   12:40 PM 05/01/2021    2:24 PM 10/04/2020    8:53 AM  Advanced Directives  Does Patient Have a Medical Advance Directive? No No No No No No No  Would patient like information on creating a medical advance directive? No - Patient declined No - Patient declined No - Patient declined No - Patient declined  No - Patient declined No - Patient declined    Current Medications (verified) Outpatient Encounter Medications as of 05/07/2022  Medication Sig   amLODipine (NORVASC) 5 MG tablet TAKE 1 TABLET BY MOUTH EVERY DAY FOR BLOOD PRESSURE   Calcium Carb-Cholecalciferol (CALCIUM + VITAMIN D3 PO) Take 1,200 mg by mouth daily.   famotidine (PEPCID) 20 MG tablet One after supper   Glycopyrrolate-Formoterol (BEVESPI AEROSPHERE) 9-4.8 MCG/ACT AERO Inhale 2 puffs into the lungs 2 (two) times daily.   hydroxychloroquine (PLAQUENIL) 200 MG tablet TAKE 1 TABLET BY MOUTH TWICE A DAY   levothyroxine (SYNTHROID) 75 MCG tablet TAKE 1 EVERY AM ON AN EMPTY STOMACH WITH WATER ONLY. NO FOOD OR OTHER MEDICATIONS FOR 30 MINUTES.   losartan (COZAAR) 100 MG tablet TAKE 1 TABLET BY  MOUTH EVERY DAY   Multiple Vitamins-Minerals (PRESERVISION AREDS 2 PO) Take 1 tablet by mouth in the morning and at bedtime.   pantoprazole (PROTONIX) 20 MG tablet Take 1 tablet (20 mg total) by mouth daily. for heartburn.   tacrolimus (PROTOPIC) 0.1 % ointment Apply to aa's lower legs BID PRN.   chlorpheniramine-HYDROcodone (TUSSIONEX) 10-8 MG/5ML Take 5 mLs by mouth every 12 (twelve) hours as needed for cough. (Patient not taking: Reported on 05/07/2022)   No facility-administered encounter medications on file as of 05/07/2022.    Allergies (verified) Epinephrine, Food, Mangifera indica, Other, Prednisone, Milk-related compounds, Pneumococcal 13-val conj vacc, and Prevnar 13 [pneumococcal 13-val conj vacc]   History: Past Medical History:  Diagnosis Date   (HFpEF) heart failure with preserved ejection fraction (HCC) 06/28/2011   a.) TTE 06/28/2011: EF 55-60%, mild AR, G1DD; b.) TTE 01/16/2013: EF 55-60%, mild MR; c.) TTE 02/16/2018: EF 60-65%, G1DD; d.) MPI 11/26/2018: EF 56%   Abnormal electrocardiogram 11/12/2018   Acute kidney failure (HCC) 01/12/2017   Adenoma of left adrenal gland    Anemia    Anxiety    Aortic atherosclerosis (HCC)    Bilateral carpal tunnel syndrome    CAD (coronary artery disease)    a.) HR CT chest 10/30/2021: 2 vessel CAD (LAD/RCA)   CAP (community acquired pneumonia)    COPD (chronic obstructive pulmonary disease) (HCC)    COPD exacerbation (HCC) 01/21/2018  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   Diverticulosis    Dyspnea    DUE TO HEAT WITH COPD   Epistaxis 01/09/2017   GERD (gastroesophageal reflux disease)    Herpes zoster 07/08/2015   Hypertension    Hypothyroidism    Iatrogenic hypotension 01/12/2017   Iliac artery stenosis, bilateral (HCC)    Morbid obesity with BMI of 40.0-44.9, adult (HCC)    New onset of headaches    Osteopenia    PMR (polymyalgia rheumatica) (HCC)    Pneumonia 02/13/2018   PONV (postoperative nausea and vomiting)     Pulmonary nodule, left    a.) CT chest 10/30/2021: 1.5 cm medial LUL nodule; b.) PET CT 11/21/2021: hypermetabolic LUL nodule (SUX max 9.3)   S/P carpal tunnel release 08/04/2020   Shingles    Stasis dermatitis    Strain of chest wall 01/20/2011   Tobacco abuse    Vitamin D deficiency    Past Surgical History:  Procedure Laterality Date   ANTERIOR CERVICAL DECOMP/DISCECTOMY FUSION  06/22/2004   C4-C6   APPENDECTOMY     CARPAL TUNNEL RELEASE Left 08/31/2019   Procedure: CARPAL TUNNEL RELEASE;  Surgeon: Donato Heinz, MD;  Location: [facility];  Service: Orthopedics;  Laterality: Left;   CARPAL TUNNEL RELEASE Right 06/22/2020   Procedure: CARPAL TUNNEL RELEASE;  Surgeon: Donato Heinz, MD;  Location: [facility];  Service: Orthopedics;  Laterality: Right;   CHOLECYSTECTOMY     COLONOSCOPY     ENDOBRONCHIAL ULTRASOUND Left 01/17/2022   Procedure: ENDOBRONCHIAL ULTRASOUND;  Surgeon: Salena Saner, MD;  Location: [facility];  Service: Pulmonary;  Laterality: Left;   ESOPHAGOGASTRODUODENOSCOPY     HEMORRHOID SURGERY     TOTAL ABDOMINAL HYSTERECTOMY W/ BILATERAL SALPINGOOPHORECTOMY     TUBAL LIGATION     Family History  Problem Relation Age of Onset   Heart attack Mother 80   Cancer Father        lung/chest wall    Prostate cancer Father    Diabetes Brother        siblings   Hypertension Sister        x 2   Heart disease Brother    Breast cancer Paternal Grandmother    Heart disease Sister    Diabetes Sister    Tuberculosis Sister    Cirrhosis Brother    Social History   Socioeconomic History   Marital status: Married    Spouse name: [name]   Number of children: 1   Years of education: Not on file   Highest education level: Not on file  Occupational History   Occupation: Catering manager as a Paramedic: RETIRED    Comment: retired  Tobacco Use   Smoking status: Former    Packs/day: 1.50    Years: 20.00    Additional pack years: 0.00    Total  pack years: 30.00    Types: Cigarettes    Quit date: 02/11/2009    Years since quitting: 13.2   Smokeless tobacco: Never  Vaping Use   Vaping Use: Never used  Substance and Sexual Activity   Alcohol use: No    Alcohol/week: 0.0 standard drinks of alcohol   Drug use: No   Sexual activity: Not Currently  Other Topics Concern   Not on file  Social History Narrative   Lives in [place] with her husband.  Takes care of her two grandchildren, ages 55 and 2, everyday in [place].  Has one son who  is 38.   Desires CPR   Would not want prolonged life support if futile   Social Determinants of Health   Financial Resource Strain: Low Risk  (05/07/2022)   Overall Financial Resource Strain (CARDIA)    Difficulty of Paying Living Expenses: Not hard at all  Food Insecurity: No Food Insecurity (05/07/2022)   Hunger Vital Sign    Worried About Running Out of Food in the Last Year: Never true    Ran Out of Food in the Last Year: Never true  Transportation Needs: No Transportation Needs (05/07/2022)   PRAPARE - Administrator, Civil Service (Medical): No    Lack of Transportation (Non-Medical): No  Physical Activity: Insufficiently Active (05/07/2022)   Exercise Vital Sign    Days of Exercise per Week: 3 days    Minutes of Exercise per Session: 30 min  Stress: No Stress Concern Present (05/07/2022)   [employer] of Occupational Health - Occupational Stress Questionnaire    Feeling of Stress : Not at all  Social Connections: Moderately Integrated (05/07/2022)   Social Connection and Isolation Panel [NHANES]    Frequency of Communication with Friends and Family: More than three times a week    Frequency of Social Gatherings with Friends and Family: More than three times a week    Attends Religious Services: More than 4 times per year    Active Member of [employer] or Organizations: No    Attends Engineer, structural: Never    Marital Status: Married    Tobacco  Counseling Counseling given: Not Answered   Clinical Intake:  Pre-visit preparation completed: Yes  Pain : No/denies pain     Nutritional Risks: None Diabetes: No  How often do you need to have someone help you when you read instructions, pamphlets, or other written materials from your doctor or pharmacy?: 1 - Never  Diabetic? no  Interpreter Needed?: No  Information entered by :: C.Josphine Laffey Lpn   Activities of Daily Living    05/07/2022    1:40 PM 01/17/2022   11:23 AM  In your present state of health, do you have any difficulty performing the following activities:  Hearing? 0 0  Vision? 0 0  Difficulty concentrating or making decisions? 0 0  Walking or climbing stairs? 0 1  Dressing or bathing? 0 0  Doing errands, shopping? 0   Preparing Food and eating ? N   Using the Toilet? N   In the past six months, have you accidently leaked urine? N   Do you have problems with loss of bowel control? N   Managing your Medications? N   Managing your Finances? N   Housekeeping or managing your Housekeeping? N     Patient Care Team: Doreene Nest, NP as PCP - General (Internal Medicine) End, Cristal Deer, MD as PCP - Cardiology (Cardiology) Haverstock, Elvin So, MD as Referring Physician (Dermatology) Lonell Face, MD as Consulting Physician (Neurology) Galen Manila, MD as Referring Physician (Ophthalmology) Sandi Mealy, MD as Consulting Physician (Dermatology) Salena Saner, MD as Consulting Physician (Pulmonary Disease)  Indicate any recent Medical Services you may have received from other than [facility] providers in the past year (date may be approximate).     Assessment:   This is a routine wellness examination for Teresa Jensen.  Hearing/Vision screen Hearing Screening - Comments:: No aids Vision Screening - Comments:: Glasses - [facility]  Dietary issues and exercise activities discussed: Current Exercise Habits: Home exercise routine, Type of  exercise: walking, Time (Minutes): 30, Frequency (Times/Week): 3, Weekly Exercise (Minutes/Week): 90, Intensity: Mild, Exercise limited by: None identified   Goals Addressed             This Visit's Progress    Patient Stated       No new goals.       Depression Screen    05/07/2022    1:36 PM 05/07/2022    1:35 PM 02/22/2022    7:47 AM 01/25/2022    8:52 AM 08/04/2021    2:09 PM 05/01/2021    2:16 PM 02/28/2021   10:18 AM  PHQ 2/9 Scores  PHQ - 2 Score 0 0  0 0 0 1  PHQ- 9 Score     0  2  Exception Documentation   Patient refusal        Fall Risk    05/07/2022    1:40 PM 04/04/2022    7:17 AM 02/22/2022    7:44 AM 05/01/2021    2:26 PM 02/28/2021   10:20 AM  Fall Risk   Falls in the past year? 0 0 0 0 0  Number falls in past yr: 0 0 0 0 0  Injury with Fall? 0 0 0 0 0  Risk for fall due to : No Fall Risks No Fall Risks No Fall Risks No Fall Risks   Follow up Falls prevention discussed;Falls evaluation completed;Education provided Falls evaluation completed Falls evaluation completed Falls prevention discussed     FALL RISK PREVENTION PERTAINING TO THE HOME:  Any stairs in or around the home? Yes  If so, are there any without handrails? No  Home free of loose throw rugs in walkways, pet beds, electrical cords, etc? Yes  Adequate lighting in your home to reduce risk of falls? Yes   ASSISTIVE DEVICES UTILIZED TO PREVENT FALLS:  Life alert? No  Use of a cane, walker or w/c? No  Grab bars in the bathroom? No  Shower chair or bench in shower? Yes  Elevated toilet seat or a handicapped toilet? No    Cognitive Function:    10/13/2015    9:30 AM  MMSE - Mini Mental State Exam  Orientation to time 5  Orientation to Place 5  Registration 3  Attention/ Calculation 0  Recall 3  Language- name 2 objects 0  Language- repeat 1  Language- follow 3 step command 3  Language- read & follow direction 0  Write a sentence 0  Copy design 0  Total score 20        05/07/2022     1:40 PM 05/01/2021    2:31 PM  6CIT Screen  What Year? 0 points 0 points  What month? 0 points 0 points  What time? 0 points 0 points  Count back from 20 0 points 0 points  Months in reverse 0 points 2 points  Repeat phrase 0 points 0 points  Total Score 0 points 2 points    Immunizations Immunization History  Administered Date(s) Administered   Fluad Quad(high Dose 65+) 09/02/2020, 10/12/2021   Influenza Whole 10/01/2008   Influenza,inj,Quad PF,6+ Mos 10/12/2014, 10/13/2015, 10/17/2016, 10/17/2017, 10/24/2018, 10/27/2019   PFIZER(Purple Top)SARS-COV-2 Vaccination 03/11/2019, 04/01/2019   Pneumococcal Conjugate-13 10/12/2014   Td 10/01/2008    TDAP status: Due, Education has been provided regarding the importance of this vaccine. Advised may receive this vaccine at local pharmacy or Health Dept. Aware to provide a copy of the vaccination record if obtained from local pharmacy or Health Dept.  Verbalized acceptance and understanding.  Flu Vaccine status: Up to date  Pneumococcal vaccine status: Declined,  Education has been provided regarding the importance of this vaccine but patient still declined. Advised may receive this vaccine at local pharmacy or Health Dept. Aware to provide a copy of the vaccination record if obtained from local pharmacy or Health Dept. Verbalized acceptance and understanding.   Covid-19 vaccine status: Declined, Education has been provided regarding the importance of this vaccine but patient still declined. Advised may receive this vaccine at local pharmacy or Health Dept.or vaccine clinic. Aware to provide a copy of the vaccination record if obtained from local pharmacy or Health Dept. Verbalized acceptance and understanding.  Qualifies for Shingles Vaccine? Yes   Zostavax completed Yes   Shingrix Completed?: No.    Education has been provided regarding the importance of this vaccine. Patient has been advised to call insurance company to determine out of  pocket expense if they have not yet received this vaccine. Advised may also receive vaccine at local pharmacy or Health Dept. Verbalized acceptance and understanding.  Screening Tests Health Maintenance  Topic Date Due   INFLUENZA VACCINE  08/02/2022   Medicare Annual Wellness (AWV)  05/07/2023   DEXA SCAN  Completed   Hepatitis C Screening  Completed   HPV VACCINES  Aged Out   Lung Cancer Screening  Discontinued   DTaP/Tdap/Td  Discontinued   Pneumonia Vaccine 35+ Years old  Discontinued   COLONOSCOPY (Pts 45-29yrs Insurance coverage will need to be confirmed)  Discontinued   COVID-19 Vaccine  Discontinued   Zoster Vaccines- Shingrix  Discontinued    Health Maintenance  There are no preventive care reminders to display for this patient.   Colorectal cancer screening: No longer required.   Mammogram status: Completed 07/13/2021. Repeat every year  Bone Density status: Completed 03/10/20. Results reflect: Bone density results: OSTEOPOROSIS. Repeat every 2 years.  Lung Cancer Screening: (Low Dose CT Chest recommended if Age 11-80 years, 30 pack-year currently smoking OR have quit w/in 15years.) does qualify.   Lung Cancer Screening Referral: no Additional Screening:  Hepatitis C Screening: does qualify; Completed 10/13/2015  Vision Screening: Recommended annual ophthalmology exams for early detection of glaucoma and other disorders of the eye. Is the patient up to date with their annual eye exam?  Yes  Who is the provider or what is the name of the office in which the patient attends annual eye exams? [facility] If pt is not established with a provider, would they like to be referred to a provider to establish care? No .   Dental Screening: Recommended annual dental exams for proper oral hygiene  Community Resource Referral / Chronic Care Management: CRR required this visit?  No   CCM required this visit?  No      Plan:     I have personally reviewed and noted the  following in the patient's chart:   Medical and social history Use of alcohol, tobacco or illicit drugs  Current medications and supplements including opioid prescriptions. Patient is not currently taking opioid prescriptions. Functional ability and status Nutritional status Physical activity Advanced directives List of other physicians Hospitalizations, surgeries, and ER visits in previous 12 months Vitals Screenings to include cognitive, depression, and falls Referrals and appointments  In addition, I have reviewed and discussed with patient certain preventive protocols, quality metrics, and best practice recommendations. A written personalized care plan for preventive services as well as general preventive health recommendations were provided to patient.  Maryan Puls, LPN   06/05/7844   Nurse Notes: Order placed for cologuard at pt's request.

## 2022-05-07 NOTE — Patient Instructions (Signed)
Ms. Teresa Jensen , Thank you for taking time to come for your Medicare Wellness Visit. I appreciate your ongoing commitment to your health goals. Please review the following plan we discussed and let me know if I can assist you in the future.   These are the goals we discussed:  Goals      Increase physical activity     Starting 10/13/2015, I will continue to walk at least 15 min daily.      Patient advised to follow up with AWV and Vaccines     Patient Stated     No new goals.        This is a list of the screening recommended for you and due dates:  Health Maintenance  Topic Date Due   Flu Shot  08/02/2022   Medicare Annual Wellness Visit  05/07/2023   DEXA scan (bone density measurement)  Completed   Hepatitis C Screening: USPSTF Recommendation to screen - Ages 21-79 yo.  Completed   HPV Vaccine  Aged Out   Screening for Lung Cancer  Discontinued   DTaP/Tdap/Td vaccine  Discontinued   Pneumonia Vaccine  Discontinued   Colon Cancer Screening  Discontinued   COVID-19 Vaccine  Discontinued   Zoster (Shingles) Vaccine  Discontinued    Advanced directives: none  Conditions/risks identified: Aim for 30 minutes of exercise or brisk walking, 6-8 glasses of water, and 5 servings of fruits and vegetables each day.   Next appointment: Follow up in one year for your annual wellness visit 05/08/23 @ 2:30 telephone   Preventive Care 65 Years and Older, Female Preventive care refers to lifestyle choices and visits with your health care provider that can promote health and wellness. What does preventive care include? A yearly physical exam. This is also called an annual well check. Dental exams once or twice a year. Routine eye exams. Ask your health care provider how often you should have your eyes checked. Personal lifestyle choices, including: Daily care of your teeth and gums. Regular physical activity. Eating a healthy diet. Avoiding tobacco and drug use. Limiting alcohol  use. Practicing safe sex. Taking low-dose aspirin every day. Taking vitamin and mineral supplements as recommended by your health care provider. What happens during an annual well check? The services and screenings done by your health care provider during your annual well check will depend on your age, overall health, lifestyle risk factors, and family history of disease. Counseling  Your health care provider may ask you questions about your: Alcohol use. Tobacco use. Drug use. Emotional well-being. Home and relationship well-being. Sexual activity. Eating habits. History of falls. Memory and ability to understand (cognition). Work and work Astronomer. Reproductive health. Screening  You may have the following tests or measurements: Height, weight, and BMI. Blood pressure. Lipid and cholesterol levels. These may be checked every 5 years, or more frequently if you are over 3 years old. Skin check. Lung cancer screening. You may have this screening every year starting at age 27 if you have a 30-pack-year history of smoking and currently smoke or have quit within the past 15 years. Fecal occult blood test (FOBT) of the stool. You may have this test every year starting at age 7. Flexible sigmoidoscopy or colonoscopy. You may have a sigmoidoscopy every 5 years or a colonoscopy every 10 years starting at age 31. Hepatitis C blood test. Hepatitis B blood test. Sexually transmitted disease (STD) testing. Diabetes screening. This is done by checking your blood sugar (glucose) after you have  not eaten for a while (fasting). You may have this done every 1-3 years. Bone density scan. This is done to screen for osteoporosis. You may have this done starting at age 38. Mammogram. This may be done every 1-2 years. Talk to your health care provider about how often you should have regular mammograms. Talk with your health care provider about your test results, treatment options, and if necessary,  the need for more tests. Vaccines  Your health care provider may recommend certain vaccines, such as: Influenza vaccine. This is recommended every year. Tetanus, diphtheria, and acellular pertussis (Tdap, Td) vaccine. You may need a Td booster every 10 years. Zoster vaccine. You may need this after age 46. Pneumococcal 13-valent conjugate (PCV13) vaccine. One dose is recommended after age 45. Pneumococcal polysaccharide (PPSV23) vaccine. One dose is recommended after age 73. Talk to your health care provider about which screenings and vaccines you need and how often you need them. This information is not intended to replace advice given to you by your health care provider. Make sure you discuss any questions you have with your health care provider. Document Released: 01/14/2015 Document Revised: 09/07/2015 Document Reviewed: 10/19/2014 Elsevier Interactive Patient Education  2017 [place] Prevention in the Home Falls can cause injuries. They can happen to people of all ages. There are many things you can do to make your home safe and to help prevent falls. What can I do on the outside of my home? Regularly fix the edges of walkways and driveways and fix any cracks. Remove anything that might make you trip as you walk through a door, such as a raised step or threshold. Trim any bushes or trees on the path to your home. Use bright outdoor lighting. Clear any walking paths of anything that might make someone trip, such as rocks or tools. Regularly check to see if handrails are loose or broken. Make sure that both sides of any steps have handrails. Any raised decks and porches should have guardrails on the edges. Have any leaves, snow, or ice cleared regularly. Use sand or salt on walking paths during winter. Clean up any spills in your garage right away. This includes oil or grease spills. What can I do in the bathroom? Use night lights. Install grab bars by the toilet and in the  tub and shower. Do not use towel bars as grab bars. Use non-skid mats or decals in the tub or shower. If you need to sit down in the shower, use a plastic, non-slip stool. Keep the floor dry. Clean up any water that spills on the floor as soon as it happens. Remove soap buildup in the tub or shower regularly. Attach bath mats securely with double-sided non-slip rug tape. Do not have throw rugs and other things on the floor that can make you trip. What can I do in the bedroom? Use night lights. Make sure that you have a light by your bed that is easy to reach. Do not use any sheets or blankets that are too big for your bed. They should not hang down onto the floor. Have a firm chair that has side arms. You can use this for support while you get dressed. Do not have throw rugs and other things on the floor that can make you trip. What can I do in the kitchen? Clean up any spills right away. Avoid walking on wet floors. Keep items that you use a lot in easy-to-reach places. If you need to  reach something above you, use a strong step stool that has a grab bar. Keep electrical cords out of the way. Do not use floor polish or wax that makes floors slippery. If you must use wax, use non-skid floor wax. Do not have throw rugs and other things on the floor that can make you trip. What can I do with my stairs? Do not leave any items on the stairs. Make sure that there are handrails on both sides of the stairs and use them. Fix handrails that are broken or loose. Make sure that handrails are as long as the stairways. Check any carpeting to make sure that it is firmly attached to the stairs. Fix any carpet that is loose or worn. Avoid having throw rugs at the top or bottom of the stairs. If you do have throw rugs, attach them to the floor with carpet tape. Make sure that you have a light switch at the top of the stairs and the bottom of the stairs. If you do not have them, ask someone to add them for  you. What else can I do to help prevent falls? Wear shoes that: Do not have high heels. Have rubber bottoms. Are comfortable and fit you well. Are closed at the toe. Do not wear sandals. If you use a stepladder: Make sure that it is fully opened. Do not climb a closed stepladder. Make sure that both sides of the stepladder are locked into place. Ask someone to hold it for you, if possible. Clearly mark and make sure that you can see: Any grab bars or handrails. First and last steps. Where the edge of each step is. Use tools that help you move around (mobility aids) if they are needed. These include: Canes. Walkers. Scooters. Crutches. Turn on the lights when you go into a dark area. Replace any light bulbs as soon as they burn out. Set up your furniture so you have a clear path. Avoid moving your furniture around. If any of your floors are uneven, fix them. If there are any pets around you, be aware of where they are. Review your medicines with your doctor. Some medicines can make you feel dizzy. This can increase your chance of falling. Ask your doctor what other things that you can do to help prevent falls. This information is not intended to replace advice given to you by your health care provider. Make sure you discuss any questions you have with your health care provider. Document Released: 10/14/2008 Document Revised: 05/26/2015 Document Reviewed: 01/22/2014 Elsevier Interactive Patient Education  2017 [employer].

## 2022-05-07 NOTE — Progress Notes (Signed)
Subjective:    Patient ID: Teresa Jensen, female    DOB: 1945/12/01, 77 y.o.   MRN: [id number] Patient Care Team: Doreene Nest, NP as PCP - General (Internal Medicine) End, Cristal Deer, MD as PCP - Cardiology (Cardiology) Haverstock, Elvin So, MD as Referring Physician (Dermatology) Lonell Face, MD as Consulting Physician (Neurology) Galen Manila, MD as Referring Physician (Ophthalmology) Sandi Mealy, MD as Consulting Physician (Dermatology) Salena Saner, MD as Consulting Physician (Pulmonary Disease)  Chief Complaint  Patient presents with   Follow-up    SOB with exertion. No wheezing. Dry cough.   HPI Patient is a 77 year old remote former smoker (30 PY) with a history as noted below who presents for follow-up on the issue of stage II COPD, postinflammatory pulmonary fibrosis and squamous cell carcinoma of the lung.  Patient follows from her visit of 08 February 2022.  This is a scheduled visit.  At her prior visit she was noted to be well compensated with regards to her COPD.  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  Recall she had robotic assisted bronchoscopy the bronchial ultrasound on 17 January 2022.  Since her last visit she has been doing well.  He does not endorse any wheezing, she has had a infrequent, mild, dry cough.  She does endorse baseline shortness of breath with exertion with no worsening.  Of note she had ambulatory oximetry performed by Dr. Sandrea Hughs on her initial visit here on 27 July 2021 that she ambulated 350 feet slow to moderate pace and stopped due to being tired, approximately 350 feet with oxygen saturations at 90% nadir.  She does not endorse any fevers, chills or sweats.  She has not had any chest pain.  No orthopnea or paroxysmal nocturnal dyspnea.  No lower extremity edema nor calf tenderness.  She does not endorse any other symptomatology.  DATA 06/2010 PFTs: FEV1 1.20  (56%), FVC 1.77 L or 63% predicted, FEV1/FVC 68%, significant airtrapping (RV 207%), DLCO 46% but nearly corrects with Alv vol. 08/30/2021 PFTs: FEV1 0.95 L or 85% predicted, FVC 1.41 L or 71% predicted, FEV1/FVC 67%, normal lung volumes, no significant response.  Moderate diffusion capacity impairment which corrects to alveolar volume. 01/17/2022 robotic bronchoscopy + EBUS: Left upper lobe nodule positive for squamous cell carcinoma.  TBNA negative for malignancy on subcarinal nodes with sufficient lymph node material consistent with sampling of lymph node.  Review of Systems A 10 point review of systems was performed and it is as noted above otherwise negative.  Patient Active Problem List   Diagnosis Date Noted   Lipodermatosclerosis 02/22/2022   Solitary pulmonary nodule 11/27/2021   Lower extremity pain 08/04/2021   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   Acute cough 05/05/2021   Morbid obesity (HCC) 05/01/2021   Foot callus 09/01/2020   Rash and nonspecific skin eruption 07/07/2020   Personal history of colonic polyps 11/01/2019   Bilateral carpal tunnel syndrome 08/16/2019   Chronic heart failure with preserved ejection fraction (HFpEF) (HCC) 11/12/2018   DOE (dyspnea on exertion) 11/12/2018   Osteoporosis 12/16/2017   Low vitamin B12 level 01/13/2017   Essential hypertension 01/08/2017   Closed fracture of metatarsal bone 09/28/2016   Prediabetes 10/17/2015   Pain of right lower extremity 05/20/2015   Medicare annual wellness visit, subsequent 10/12/2014   Occipital neuralgia 10/12/2014   Vitamin D deficiency 10/12/2014   Erythema of lower extremity 09/08/2013  COPD GOLD 2  06/05/2010   Hypothyroidism 09/29/2009   Situational anxiety 10/01/2008   COPD 10/01/2008   GERD 10/01/2008   Osteoarthritis 10/01/2008   Social History   Tobacco Use   Smoking status: Former    Packs/day: 1.50    Years: 20.00    Additional pack years: 0.00    Total pack years:  30.00    Types: Cigarettes    Quit date: 02/11/2009    Years since quitting: 13.2   Smokeless tobacco: Never  Substance Use Topics   Alcohol use: No    Alcohol/week: 0.0 standard drinks of alcohol   Allergies  Allergen Reactions   Epinephrine Anaphylaxis and Shortness Of Breath   Food Rash    MANGO = cause rashes around the mouth LIPS GO NUMB   [employer] Rash    THIS IS MANGO LIPS GO NUMB   Other Rash    MANGO = cause rashes around the mouth MANGO = cause rashes around the mouth LIPS GO NUMB   Prednisone Anaphylaxis, Hives, Shortness Of Breath, Itching and Swelling    The face swells HAS TAKEN CORTISONE SHOTS WITHOUT ISSUE BEFORE   Milk-Related Compounds Diarrhea    DAIRY PRODUCTS ARE A PROBLEM   Pneumococcal 13-Val Conj Vacc Itching and Other (See Comments)    Arm temp elevated at site of injection, red, itching   Prevnar 13 [Pneumococcal 13-Val Conj Vacc] Other (See Comments)    Arm temp elevated at site of injection, red, itching   Current Meds  Medication Sig   amLODipine (NORVASC) 5 MG tablet TAKE 1 TABLET BY MOUTH EVERY DAY FOR BLOOD PRESSURE   Calcium Carb-Cholecalciferol (CALCIUM + VITAMIN D3 PO) Take 1,200 mg by mouth daily.   famotidine (PEPCID) 20 MG tablet One after supper   Glycopyrrolate-Formoterol (BEVESPI AEROSPHERE) 9-4.8 MCG/ACT AERO Inhale 2 puffs into the lungs 2 (two) times daily.   hydroxychloroquine (PLAQUENIL) 200 MG tablet TAKE 1 TABLET BY MOUTH TWICE A DAY   levothyroxine (SYNTHROID) 75 MCG tablet TAKE 1 EVERY AM ON AN EMPTY STOMACH WITH WATER ONLY. NO FOOD OR OTHER MEDICATIONS FOR 30 MINUTES.   losartan (COZAAR) 100 MG tablet TAKE 1 TABLET BY MOUTH EVERY DAY   Multiple Vitamins-Minerals (PRESERVISION AREDS 2 PO) Take 1 tablet by mouth in the morning and at bedtime.   pantoprazole (PROTONIX) 20 MG tablet Take 1 tablet (20 mg total) by mouth daily. for heartburn.   tacrolimus (PROTOPIC) 0.1 % ointment Apply to aa's lower legs BID PRN.    Immunization History  Administered Date(s) Administered   Fluad Quad(high Dose 65+) 09/02/2020, 10/12/2021   Influenza Whole 10/01/2008   Influenza,inj,Quad PF,6+ Mos 10/12/2014, 10/13/2015, 10/17/2016, 10/17/2017, 10/24/2018, 10/27/2019   PFIZER(Purple Top)SARS-COV-2 Vaccination 03/11/2019, 04/01/2019   Pneumococcal Conjugate-13 10/12/2014   Td 10/01/2008        Objective:   Physical Exam BP 118/72 (BP Location: Left Arm, Cuff Size: Normal)   Pulse 92   Temp (!) 97.3 F (36.3 C)   Ht 4\' 9"  (1.448 m)   Wt 186 lb 6.4 oz (84.6 kg)   SpO2 90%   BMI 40.34 kg/m   SpO2: 90 % O2 Device: None (Room air)  GENERAL: Obese woman, no acute distress.  Fully ambulatory, no conversational dyspnea. HEAD: Normocephalic, atraumatic.  EYES: Pupils equal, round, reactive to light.  No scleral icterus.  MOUTH: Dentition intact, oral mucosa moist. NECK: Supple. No thyromegaly. Trachea midline. No JVD.  No adenopathy. PULMONARY: Good air entry bilaterally.  Coarse,  otherwise, no adventitious sounds. CARDIOVASCULAR: S1 and S2. Regular rate and rhythm.  Grade 1/6 systolic ejection murmur LSB, no gallops or rubs noted. ABDOMEN: Obese, otherwise benign. MUSCULOSKELETAL: No joint deformity, no clubbing, no edema.  NEUROLOGIC: No overt focal deficit, no gait disturbance, speech is fluent. SKIN: Intact,warm,dry. PSYCH: Mood and behavior normal.  Ambulatory oximetry was performed today: At rest heart rate was 86 bpm, oxygen saturation 91% on room air.  After less than 250 feet patient's saturation nadir was 82%.  Patient then was placed on 2 L/min and desaturated to 87%, required 4 L/min to maintain 90 to 91% O2 sats.  Heart rate at maximum exercise was 106 bpm.  Patient did not complain of dyspnea during testing.      Assessment & Plan:     ICD-10-CM   1. Postinflammatory pulmonary fibrosis (HCC)  J84.10 Overnight Pulse Oximetry Study    AMB REFERRAL FOR DME   Does not meet UIP  criteria Continue supportive care    2. COPD GOLD 2   J44.9 Overnight Pulse Oximetry Study    AMB REFERRAL FOR DME   Continue Bevespi and albuterol    3. Chronic respiratory failure with hypoxia (HCC)  J96.11 Overnight Pulse Oximetry Study    AMB REFERRAL FOR DME   She has exhibiting hypoxemia with activity Oxygen supplementation at 4 L/min with activity Check ONOX Referral to DME for oxygen supplementation    4. 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 Obesity, Class III, BMI 40-49.9 (morbid obesity) (HCC)  E66.01    This issue adds complexity to her management May have element of alveolar hypoventilation Weight loss recommended     Orders Placed This Encounter  Procedures   AMB REFERRAL FOR DME    Referral Priority:   Routine    Referral Type:   Durable Medical Equipment Purchase    Number of Visits Requested:   1   Overnight Pulse Oximetry Study    Room Air DME: [employer]    Standing Status:   Future    Standing Expiration Date:   05/07/2023   Will see the patient in follow-up in 3 months time she is to contact us prior to that time should any new difficulties arise.  Gailen Shelter, MD Advanced Bronchoscopy PCCM [facility]    *This note was dictated using voice recognition software/Dragon.  Despite best efforts to proofread, errors can occur which can change the meaning. Any transcriptional errors that result from this process are unintentional and may not be fully corrected at the time of dictation.

## 2022-05-10 ENCOUNTER — Other Ambulatory Visit: Payer: Self-pay

## 2022-05-10 DIAGNOSIS — J9611 Chronic respiratory failure with hypoxia: Secondary | ICD-10-CM

## 2022-05-14 ENCOUNTER — Ambulatory Visit (INDEPENDENT_AMBULATORY_CARE_PROVIDER_SITE_OTHER): Payer: Medicare Other | Admitting: Podiatry

## 2022-05-14 DIAGNOSIS — D2372 Other benign neoplasm of skin of left lower limb, including hip: Secondary | ICD-10-CM

## 2022-05-14 NOTE — Patient Instructions (Signed)
Look for salicylic 40% medicated pads, cream or ointment and apply to the thickened dry skin / calluses. This can be bought over the counter, at a pharmacy or online such as [employer].  

## 2022-05-14 NOTE — Progress Notes (Signed)
  Subjective:  Patient ID: Teresa Jensen, female    DOB: 09-19-1945,  MRN: [id number]  Chief Complaint  Patient presents with   Callouses    np re est care/ last seen 2019/ possible wart on bottom of left foot    77 y.o. female presents with the above complaint. History confirmed with patient.  has a painful skin growth on the outside of the fifth toe that is hurting  Objective:  Physical Exam: warm, good capillary refill, no trophic changes or ulcerative lesions, normal DP and PT pulses, normal sensory exam, and benign-appearing skin lesion plantar lateral fifth metatarsal  Assessment:   1. Benign neoplasm of skin of lower extremity, including hip, left      Plan:  Patient was evaluated and treated and all questions answered.  We discussed the etiology and treatment options of such skin lesions, discussed chemical destruction with salicylic acid and I recommend she do this at home.  Lesion was debrided sharply with a scalpel today and salicylic acid applied to destroy lesion.  Post care instructions given.  Also discussed utilizing offloading pads.  Return as needed if it worsens or does not improve.  Return if symptoms worsen or fail to improve.

## 2022-05-17 ENCOUNTER — Encounter: Payer: Self-pay | Admitting: Dermatology

## 2022-05-17 ENCOUNTER — Ambulatory Visit (INDEPENDENT_AMBULATORY_CARE_PROVIDER_SITE_OTHER): Payer: Medicare Other | Admitting: Dermatology

## 2022-05-17 DIAGNOSIS — Z79899 Other long term (current) drug therapy: Secondary | ICD-10-CM | POA: Diagnosis not present

## 2022-05-17 DIAGNOSIS — I872 Venous insufficiency (chronic) (peripheral): Secondary | ICD-10-CM | POA: Diagnosis not present

## 2022-05-17 DIAGNOSIS — M793 Panniculitis, unspecified: Secondary | ICD-10-CM | POA: Diagnosis not present

## 2022-05-17 MED ORDER — TACROLIMUS 0.1 % EX OINT
TOPICAL_OINTMENT | CUTANEOUS | 3 refills | Status: DC
Start: 2022-05-17 — End: 2022-12-10

## 2022-05-17 NOTE — Progress Notes (Signed)
   Follow-Up Visit   Subjective  Teresa Jensen is a 77 y.o. female who presents for the following: Lipodermatosclerosis, 6 month follow up. Taking Plaquenil 200 mg twice daily. Tolerating well, denies adverse reactions. Painful at times, itching at times.   The following portions of the chart were reviewed this encounter and updated as appropriate: medications, allergies, medical history  Review of Systems:  No other skin or systemic complaints except as noted in HPI or Assessment and Plan.  Objective  Well appearing patient in no apparent distress; mood and affect are within normal limits.  A focused examination was performed of the following areas: B/L legs  Relevant exam findings are noted in the Assessment and Plan.    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 Condition is symptomatic/ bothersome to patient. Not currently at goal.  Treatment Plan: Continue Tacrolimus ointment twice daily as directed.  Will discuss continuation of Plaquenil with Dr. Jayme Cloud and Dr. Aggie Cosier. If they are okay with it, will continue pending normal labs. She is up to date on her eye exam from earlier this year. Will also add pentoxifylline if they are in agreement  Continue compression daily  Related Procedures CMP CBC with Differential/Platelet  Encounter for long-term (current) use of medication   Return for TBSE As Scheduled,  Lipodermatosclerosis follow up.  I, Lawson Radar, CMA, am acting as scribe for Darden Dates, MD.   Documentation: I have reviewed the above documentation for accuracy and completeness, and I agree with the above.  Darden Dates, MD

## 2022-05-17 NOTE — Patient Instructions (Addendum)
Continue Tacrolimus ointment twice daily as directed.    Continue compression stockings.    Will call you with decision regarding continuation of Plaquenil and if we need labs repeated.     Due to recent changes in healthcare laws, you may see results of your pathology and/or laboratory studies on MyChart before the doctors have had a chance to review them. We understand that in some cases there may be results that are confusing or concerning to you. Please understand that not all results are received at the same time and often the doctors may need to interpret multiple results in order to provide you with the best plan of care or course of treatment. Therefore, we ask that you please give [place] 2 business days to thoroughly review all your results before contacting the office for clarification. Should we see a critical lab result, you will be contacted sooner.   If You Need Anything After Your Visit  If you have any questions or concerns for your doctor, please call our main line at [phone] and press option 4 to reach your doctor's medical assistant. If no one answers, please leave a voicemail as directed and we will return your call as soon as possible. Messages left after 4 pm will be answered the following business day.   You may also send [place] a message via MyChart. We typically respond to MyChart messages within 1-2 business days.  For prescription refills, please ask your pharmacy to contact our office. Our fax number is [phone].  If you have an urgent issue when the clinic is closed that cannot wait until the next business day, you can M[place]ke your doctor at the number below.    Please note that while we do our best to be available for urgent issues outside of office hours, we are not available 24/7.   If you have an urgent issue and are unable to reach [place], you may choose to seek medical care at your doctor's office, retail clinic, urgent care center, or emergency room.  If you  have a medical emergency, please immediately call 911 or go to the emergency department.  Pager Numbers  - Dr. Gwen Pounds: [phone]  - Dr. Neale Burly: [phone]  - Dr. Roseanne Reno: [phone]  In the event of inclement weather, please call our main line at [phone] for an update on the status of any delays or closures.  Dermatology Medication Tips: Please keep the boxes that topical medications come in in order to help keep track of the instructions about where and how to use these. Pharmacies typically print the medication instructions only on the boxes and not directly on the medication tubes.   If your medication is too expensive, please contact our office at [phone] option 4 or send [place] a message through MyChart.   We are unable to tell what your co-pay for medications will be in advance as this is different depending on your insurance coverage. However, we may be able to find a substitute medication at lower cost or fill out paperwork to get insurance to cover a needed medication.   If a prior authorization is required to get your medication covered by your insurance company, please allow [place] 1-2 business days to complete this process.  Drug prices often vary depending on where the prescription is filled and some pharmacies may offer cheaper prices.  The website www.goodrx.com contains coupons for medications through different pharmacies. The prices here do not account for what the cost may be with help from  insurance (it may be cheaper with your insurance), but the website can give you the price if you did not use any insurance.  - You can print the associated coupon and take it with your prescription to the pharmacy.  - You may also stop by our office during regular business hours and pick up a GoodRx coupon card.  - If you need your prescription sent electronically to a different pharmacy, notify our office through [facility] or by phone at [phone] option  4.     Si Usted Necesita Algo Despus de Su Visita  Tambin puede enviarnos un mensaje a travs de Pharmacist, community. Por lo general respondemos a los mensajes de MyChart en el transcurso de 1 a 2 das hbiles.  Para renovar recetas, por favor pida a su farmacia que se ponga en contacto con nuestra oficina. Harland Dingwall de fax es [place] [phone].  Si tiene un asunto urgente cuando la clnica est cerrada y que no puede esperar hasta el siguiente da hbil, puede llamar/localizar a su doctor(a) al nmero que aparece a continuacin.   Por favor, tenga en cuenta que aunque hacemos todo lo posible para estar disponibles para asuntos urgentes fuera del horario de [place], no estamos disponibles las 24 horas del da, los 7 das de la [place].   Si tiene un problema urgente y no puede comunicarse con nosotros, puede optar por buscar atencin mdica  en el consultorio de su doctor(a), en una clnica privada, en un centro de atencin urgente o en una sala de emergencias.  Si tiene Engineering geologist, por favor llame inmediatamente al 911 o vaya a la sala de emergencias.  Nmeros de bper  - Dr. Nehemiah Massed: [phone]  - Dra. Moye: [phone]  - Dra. Nicole Kindred: [phone]  En caso de inclemencias del [place], por favor llame a Johnsie Kindred principal al [phone] para una actualizacin sobre el [place] de cualquier retraso o cierre.  Consejos para la medicacin en dermatologa: Por favor, guarde las cajas en las que vienen los medicamentos de uso tpico para ayudarle a seguir las instrucciones sobre dnde y cmo usarlos. Las farmacias generalmente imprimen las instrucciones del medicamento slo en las cajas y no directamente en los tubos del [place].   Si su medicamento es muy caro, por favor, pngase en contacto con Zigmund Daniel llamando al [phone] y presione la opcin 4 o envenos un mensaje a travs de Pharmacist, community.   No podemos decirle cul ser su copago por los medicamentos por  adelantado ya que esto es diferente dependiendo de la cobertura de su seguro. Sin embargo, es posible que podamos encontrar un medicamento sustituto a Electrical engineer un formulario para que el seguro cubra el medicamento que se considera necesario.   Si se requiere una autorizacin previa para que su compaa de seguros [place] su medicamento, por favor permtanos de 1 a 2 das hbiles para completar este proceso.  Los precios de los medicamentos varan con frecuencia dependiendo del Environmental consultant de dnde se surte la receta y alguna farmacias pueden ofrecer precios ms baratos.  El sitio web www.goodrx.com tiene cupones para medicamentos de Airline pilot. Los precios aqu no tienen en cuenta lo que podra costar con la ayuda del seguro (puede ser ms barato con su seguro), pero el sitio web puede darle el precio si no utiliz Research scientist (physical sciences).  - Puede imprimir el cupn correspondiente y llevarlo con su receta a la farmacia.  - Tambin puede pasar por nuestra oficina durante el horario de  atencin regular y recoger una tarjeta de cupones de GoodRx.  - Si necesita que su receta se enve electrnicamente a una farmacia diferente, informe a nuestra oficina a travs de MyChart de [place] o por telfono llamando al [phone] y presione la opcin 4.  

## 2022-05-19 ENCOUNTER — Other Ambulatory Visit: Payer: Self-pay | Admitting: Primary Care

## 2022-05-19 DIAGNOSIS — E038 Other specified hypothyroidism: Secondary | ICD-10-CM

## 2022-05-21 ENCOUNTER — Other Ambulatory Visit: Payer: Self-pay | Admitting: Dermatology

## 2022-05-21 DIAGNOSIS — M793 Panniculitis, unspecified: Secondary | ICD-10-CM

## 2022-05-22 NOTE — Telephone Encounter (Signed)
I am waiting to hear back from Dr. Gonzalez. Dr. Chrystal had no concerns. If she is running out, we can refill. If she has some left, I would wait a little longer to confirm with Dr. Gonzalez and get her blood work. Thank you!   

## 2022-05-22 NOTE — Telephone Encounter (Signed)
Patient has enough medicine to last at least a month. She does not need refills at this point. Will deny refill for now. JP

## 2022-05-22 NOTE — Telephone Encounter (Signed)
I am waiting to hear back from Dr. Jayme Cloud. Dr. Rushie Chestnut had no concerns. If she is running out, we can refill. If she has some left, I would wait a little longer to confirm with Dr. Jayme Cloud and get her blood work. Thank you!

## 2022-05-24 ENCOUNTER — Telehealth: Payer: Self-pay | Admitting: Dermatology

## 2022-05-24 ENCOUNTER — Other Ambulatory Visit: Payer: Self-pay

## 2022-05-24 MED ORDER — PENTOXIFYLLINE ER 400 MG PO TBCR: 400.0000 mg | EXTENDED_RELEASE_TABLET | Freq: Three times a day (TID) | ORAL | 1 refills | Status: DC

## 2022-05-24 NOTE — Telephone Encounter (Signed)
Please let Teresa Jensen know that I heard back from Dr. Rushie Chestnut and Dr. Jayme Cloud and they are both fine with her continuing the hydroxychloroquine. She should go by to get the lab work done and we can send refills as long as everything looks okay.  I also recommend starting pentoxifylline 400 mg three times per day to see if this helps. Thank you!

## 2022-05-24 NOTE — Progress Notes (Signed)
pentof

## 2022-05-24 NOTE — Telephone Encounter (Signed)
Advised patient Dr. Rushie Chestnut and Dr. Jayme Cloud are ok with her continuing hydroxychloroquine and that she needs to get labs done for refills. Patient will do labs next week. Sent in pentoxifylline 400 mg to take tid.  Butch Penny., RMA

## 2022-05-30 ENCOUNTER — Ambulatory Visit (INDEPENDENT_AMBULATORY_CARE_PROVIDER_SITE_OTHER): Payer: Medicare Other | Admitting: Primary Care

## 2022-05-30 ENCOUNTER — Encounter: Payer: Self-pay | Admitting: Primary Care

## 2022-05-30 VITALS — BP 124/80 | HR 83 | Temp 98.6°F | Ht <= 58 in | Wt 188.0 lb

## 2022-05-30 DIAGNOSIS — M79601 Pain in right arm: Secondary | ICD-10-CM | POA: Diagnosis not present

## 2022-05-30 NOTE — Assessment & Plan Note (Signed)
Acute.  Appears MSK, improves with Tylenol.  Lower suspicion for DVT. No evidence of Shingles.   Discussed to continue Tylenol, try topical Voltaren Gel.  Consider PT, she declines today.  She will update if no improvement.

## 2022-05-30 NOTE — Progress Notes (Signed)
Subjective:    Patient ID: Teresa Jensen, female    DOB: [birth year], 77 y.o.   MRN: [id number]  Arm Pain  Pertinent negatives include no chest pain.    Teresa Jensen is a very pleasant 77 y.o. female with a history of hypertension, heart failure, pulmonary fibrosis, COPD, squamous cell carcinoma, osteoporosis, carpal tunnel syndrome, occipital neuralgia who presents today to discuss upper extremity pain.  Symptom onset about 2 weeks ago with pain to the right medial humeral region of her right upper extremity. Her pain is intermittent, worse with certain movements.   She denies injury, overuse, bruising, redness, swelling, warmth, shoulder pain, history of DVT, chest pain, shortness of breat. She's been taking Tylenol with some improvement. She does have chronic numbness to her bilateral hands from carpal tunnel syndrome with history of bilateral release.    Review of Systems  Cardiovascular:  Negative for chest pain.  Musculoskeletal:        Right upper extremity pain  Skin:  Negative for color change and rash.         Past Medical History:  Diagnosis Date   (HFpEF) heart failure with preserved ejection fraction (HCC) 06/28/2011   a.) TTE 06/28/2011: EF 55-60%, mild AR, G1DD; b.) TTE 01/16/2013: EF 55-60%, mild MR; c.) TTE 02/16/2018: EF 60-65%, G1DD; d.) MPI 11/26/2018: EF 56%   Abnormal electrocardiogram 11/12/2018   Acute kidney failure (HCC) 01/12/2017   Adenoma of left adrenal gland    Anemia    Anxiety    Aortic atherosclerosis (HCC)    Bilateral carpal tunnel syndrome    CAD (coronary artery disease)    a.) HR CT chest 10/30/2021: 2 vessel CAD (LAD/RCA)   CAP (community acquired pneumonia)    COPD (chronic obstructive pulmonary disease) (HCC)    COPD exacerbation (HCC) 01/21/2018   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   Diverticulosis    Dyspnea    DUE TO HEAT WITH COPD   Epistaxis 01/09/2017   GERD (gastroesophageal reflux disease)    Herpes zoster  07/08/2015   Hypertension    Hypothyroidism    Iatrogenic hypotension 01/12/2017   Iliac artery stenosis, bilateral (HCC)    Morbid obesity with BMI of 40.0-44.9, adult (HCC)    New onset of headaches    Osteopenia    PMR (polymyalgia rheumatica) (HCC)    Pneumonia 02/13/2018   PONV (postoperative nausea and vomiting)    Pulmonary nodule, left    a.) CT chest 10/30/2021: 1.5 cm medial LUL nodule; b.) PET CT 11/21/2021: hypermetabolic LUL nodule (SUX max 9.3)   S/P carpal tunnel release 08/04/2020   Shingles    Stasis dermatitis    Strain of chest wall 01/20/2011   Tobacco abuse    Vitamin D deficiency     Social History   Socioeconomic History   Marital status: Married    Spouse name: [name]   Number of children: 1   Years of education: Not on file   Highest education level: Not on file  Occupational History   Occupation: Catering manager as a Paramedic: RETIRED    Comment: retired  Tobacco Use   Smoking status: Former    Packs/day: 1.50    Years: 20.00    Additional pack years: 0.00    Total pack years: 30.00    Types: Cigarettes    Quit date: 02/11/2009    Years since quitting: 13.3   Smokeless tobacco: Never  Vaping Use   Vaping  Use: Never used  Substance and Sexual Activity   Alcohol use: No    Alcohol/week: 0.0 standard drinks of alcohol   Drug use: No   Sexual activity: Not Currently  Other Topics Concern   Not on file  Social History Narrative   Lives in [place] with her husband.  Takes care of her two grandchildren, ages 54 and 2, everyday in [place].  Has one son who is 71.   Desires CPR   Would not want prolonged life support if futile   Social Determinants of Health   Financial Resource Strain: Low Risk  (05/07/2022)   Overall Financial Resource Strain (CARDIA)    Difficulty of Paying Living Expenses: Not hard at all  Food Insecurity: No Food Insecurity (05/07/2022)   Hunger Vital Sign    Worried About Running Out of Food in the  Last Year: Never true    Ran Out of Food in the Last Year: Never true  Transportation Needs: No Transportation Needs (05/07/2022)   PRAPARE - Administrator, Civil Service (Medical): No    Lack of Transportation (Non-Medical): No  Physical Activity: Insufficiently Active (05/07/2022)   Exercise Vital Sign    Days of Exercise per Week: 3 days    Minutes of Exercise per Session: 30 min  Stress: No Stress Concern Present (05/07/2022)   [employer] of Occupational Health - Occupational Stress Questionnaire    Feeling of Stress : Not at all  Social Connections: Moderately Integrated (05/07/2022)   Social Connection and Isolation Panel [NHANES]    Frequency of Communication with Friends and Family: More than three times a week    Frequency of Social Gatherings with Friends and Family: More than three times a week    Attends Religious Services: More than 4 times per year    Active Member of [employer] or Organizations: No    Attends Banker Meetings: Never    Marital Status: Married  Catering manager Violence: Not At Risk (05/07/2022)   Humiliation, Afraid, Rape, and Kick questionnaire    Fear of Current or Ex-Partner: No    Emotionally Abused: No    Physically Abused: No    Sexually Abused: No    Past Surgical History:  Procedure Laterality Date   ANTERIOR CERVICAL DECOMP/DISCECTOMY FUSION  06/22/2004   C4-C6   APPENDECTOMY     CARPAL TUNNEL RELEASE Left 08/31/2019   Procedure: CARPAL TUNNEL RELEASE;  Surgeon: Donato Heinz, MD;  Location: [facility] ORS;  Service: Orthopedics;  Laterality: Left;   CARPAL TUNNEL RELEASE Right 06/22/2020   Procedure: CARPAL TUNNEL RELEASE;  Surgeon: Donato Heinz, MD;  Location: [facility] ORS;  Service: Orthopedics;  Laterality: Right;   CHOLECYSTECTOMY     COLONOSCOPY     ENDOBRONCHIAL ULTRASOUND Left 01/17/2022   Procedure: ENDOBRONCHIAL ULTRASOUND;  Surgeon: Salena Saner, MD;  Location: [facility] ORS;  Service: Pulmonary;  Laterality:  Left;   ESOPHAGOGASTRODUODENOSCOPY     HEMORRHOID SURGERY     TOTAL ABDOMINAL HYSTERECTOMY W/ BILATERAL SALPINGOOPHORECTOMY     TUBAL LIGATION      Family History  Problem Relation Age of Onset   Heart attack Mother 44   Cancer Father        lung/chest wall    Prostate cancer Father    Diabetes Brother        siblings   Hypertension Sister        x 2   Heart disease Brother    Breast cancer  Paternal Grandmother    Heart disease Sister    Diabetes Sister    Tuberculosis Sister    Cirrhosis Brother     Allergies  Allergen Reactions   Epinephrine Anaphylaxis and Shortness Of Breath   Food Rash    MANGO = cause rashes around the mouth LIPS GO NUMB   Mang[employer]era [employer] Rash    THIS IS MANGO LIPS GO NUMB   Other Rash    MANGO = cause rashes around the mouth MANGO = cause rashes around the mouth LIPS GO NUMB   Prednisone Anaphylaxis, Hives, Shortness Of Breath, Itching and Swelling    The face swells HAS TAKEN CORTISONE SHOTS WITHOUT ISSUE BEFORE   Milk-Related Compounds Diarrhea    DAIRY PRODUCTS ARE A PROBLEM   Pneumococcal 13-Val Conj Vacc Itching and Other (See Comments)    Arm temp elevated at site of injection, red, itching   Prevnar 13 [Pneumococcal 13-Val Conj Vacc] Other (See Comments)    Arm temp elevated at site of injection, red, itching    Current Outpatient Medications on File Prior to Visit  Medication Sig Dispense Refill   amLODipine (NORVASC) 5 MG tablet TAKE 1 TABLET BY MOUTH EVERY DAY FOR BLOOD PRESSURE 90 tablet 3   Calcium Carb-Cholecalciferol (CALCIUM + VITAMIN D3 PO) Take 1,200 mg by mouth daily.     famotidine (PEPCID) 20 MG tablet One after supper 30 tablet 11   Glycopyrrolate-Formoterol (BEVESPI AEROSPHERE) 9-4.8 MCG/ACT AERO Inhale 2 puffs into the lungs 2 (two) times daily. 1 each 11   hydroxychloroquine (PLAQUENIL) 200 MG tablet TAKE 1 TABLET BY MOUTH TWICE A DAY 180 tablet 1   levothyroxine (SYNTHROID) 75 MCG tablet TAKE 1 EVERY AM ON AN  EMPTY STOMACH WITH WATER ONLY. NO FOOD OR OTHER MEDICATIONS FOR 30 MINUTES. 90 tablet 2   losartan (COZAAR) 100 MG tablet TAKE 1 TABLET BY MOUTH EVERY DAY 90 tablet 2   Multiple Vitamins-Minerals (PRESERVISION AREDS 2 PO) Take 1 tablet by mouth in the morning and at bedtime.     pantoprazole (PROTONIX) 20 MG tablet Take 1 tablet (20 mg total) by mouth daily. for heartburn. 90 tablet 3   pentoxifylline (TRENTAL) 400 MG CR tablet Take 1 tablet (400 mg total) by mouth 3 (three) times daily with meals. 90 tablet 1   tacrolimus (PROTOPIC) 0.1 % ointment Apply to aa's lower legs BID PRN. 60 g 3   chlorpheniramine-HYDROcodone (TUSSIONEX) 10-8 MG/5ML Take 5 mLs by mouth every 12 (twelve) hours as needed for cough. (Patient not taking: Reported on 05/07/2022) 50 mL 0   No current facility-administered medications on file prior to visit.    BP 124/80   Pulse 83   Temp 98.6 F (37 C) (Temporal)   Ht 4\' 9"  (1.448 m)   Wt 188 lb (85.3 kg)   SpO2 94%   BMI 40.68 kg/m  Objective:   Physical Exam Constitutional:      General: She is not in acute distress. Cardiovascular:     Rate and Rhythm: Normal rate and regular rhythm.  Pulmonary:     Effort: Pulmonary effort is normal.     Breath sounds: Normal breath sounds.  Musculoskeletal:     Right shoulder: Normal range of motion.     Left shoulder: Normal range of motion.     Right upper arm: Tenderness present. No swelling, edema or bony tenderness.       Arms:     Comments: Tenderness with deep palpation to medial right  mid humeral arm  Skin:    Findings: No erythema or rash.     Comments: No soft tissue swelling to right upper extremity   Neurological:     Mental Status: She is oriented to person, place, and time.           Assessment & Plan:  Pain of right upper extremity Assessment & Plan: Acute.  Appears MSK, improves with Tylenol.  Lower suspicion for DVT. No evidence of Shingles.   Discussed to continue Tylenol, try topical  Voltaren Gel.  Consider PT, she declines today.  She will update if no improvement.          Doreene Nest, NP

## 2022-05-30 NOTE — Patient Instructions (Addendum)
Try applying Voltaren Gel twice daily for for 1-2 weeks.  Resume Tylenol Extra Strength daily.   Please notify me if no improvement.  It was a pleasure to see you today!

## 2022-05-31 ENCOUNTER — Telehealth: Payer: Self-pay

## 2022-05-31 DIAGNOSIS — M793 Panniculitis, unspecified: Secondary | ICD-10-CM

## 2022-05-31 LAB — COMPREHENSIVE METABOLIC PANEL
ALT: 13 IU/L (ref 0–32)
AST: 17 IU/L (ref 0–40)
Albumin/Globulin Ratio: 1.3 (ref 1.2–2.2)
Albumin: 4 g/dL (ref 3.8–4.8)
Alkaline Phosphatase: 77 IU/L (ref 44–121)
BUN/Creatinine Ratio: 20 (ref 12–28)
BUN: 18 mg/dL (ref 8–27)
Bilirubin Total: 0.2 mg/dL (ref 0.0–1.2)
CO2: 22 mmol/L (ref 20–29)
Calcium: 9.3 mg/dL (ref 8.7–10.3)
Chloride: 103 mmol/L (ref 96–106)
Creatinine, Ser: 0.9 mg/dL (ref 0.57–1.00)
Globulin, Total: 3 g/dL (ref 1.5–4.5)
Glucose: 136 mg/dL — ABNORMAL HIGH (ref 70–99)
Potassium: 4.4 mmol/L (ref 3.5–5.2)
Sodium: 141 mmol/L (ref 134–144)
Total Protein: 7 g/dL (ref 6.0–8.5)
eGFR: 66 mL/min/{1.73_m2} (ref >59)

## 2022-05-31 LAB — CBC WITH DIFFERENTIAL/PLATELET
Basophils Absolute: 0.1 10*3/uL (ref 0.0–0.2)
Basos: 1 %
EOS (ABSOLUTE): 0.1 10*3/uL (ref 0.0–0.4)
Eos: 1 %
Hematocrit: 38 % (ref 34.0–46.6)
Hemoglobin: 12 g/dL (ref 11.1–15.9)
Immature Grans (Abs): 0 10*3/uL (ref 0.0–0.1)
Immature Granulocytes: 0 %
Lymphocytes Absolute: 1.6 10*3/uL (ref 0.7–3.1)
Lymphs: 17 %
MCH: 27.9 pg (ref 26.6–33.0)
MCHC: 31.6 g/dL (ref 31.5–35.7)
MCV: 88 fL (ref 79–97)
Monocytes Absolute: 0.8 10*3/uL (ref 0.1–0.9)
Monocytes: 9 %
Neutrophils Absolute: 6.3 10*3/uL (ref 1.4–7.0)
Neutrophils: 72 %
Platelets: 265 10*3/uL (ref 150–450)
RBC: 4.3 x10E6/uL (ref 3.77–5.28)
RDW: 13.8 % (ref 11.7–15.4)
WBC: 8.9 10*3/uL (ref 3.4–10.8)

## 2022-05-31 MED ORDER — HYDROXYCHLOROQUINE SULFATE 200 MG PO TABS
ORAL_TABLET | ORAL | 1 refills | Status: DC
Start: 2022-05-31 — End: 2022-07-23

## 2022-05-31 NOTE — Telephone Encounter (Signed)
-----   Message from Sandi Mealy, MD sent at 05/31/2022  1:10 PM EDT ----- Labs ok, cont hydroxychloroquine  MAs please call and send 6 months of refills. Thank you!

## 2022-05-31 NOTE — Telephone Encounter (Signed)
Left voicemail to return my call. Prescription refills sent to pharmacy.

## 2022-06-01 ENCOUNTER — Other Ambulatory Visit: Payer: Self-pay | Admitting: Primary Care

## 2022-06-01 DIAGNOSIS — R195 Other fecal abnormalities: Secondary | ICD-10-CM

## 2022-06-01 DIAGNOSIS — Z1211 Encounter for screening for malignant neoplasm of colon: Secondary | ICD-10-CM

## 2022-06-01 LAB — COLOGUARD: COLOGUARD: POSITIVE — AB

## 2022-06-04 ENCOUNTER — Telehealth: Payer: Self-pay

## 2022-06-04 NOTE — Telephone Encounter (Signed)
Patient advised of pathology results 

## 2022-06-04 NOTE — Telephone Encounter (Signed)
-----   Message from Virginia Moye, MD sent at 05/31/2022  1:10 PM EDT ----- Labs ok, cont hydroxychloroquine  MAs please call and send 6 months of refills. Thank you! 

## 2022-06-16 ENCOUNTER — Other Ambulatory Visit: Payer: Self-pay | Admitting: Dermatology

## 2022-07-04 ENCOUNTER — Encounter: Payer: Self-pay | Admitting: Primary Care

## 2022-07-04 ENCOUNTER — Ambulatory Visit (INDEPENDENT_AMBULATORY_CARE_PROVIDER_SITE_OTHER): Payer: Medicare Other | Admitting: Primary Care

## 2022-07-04 VITALS — BP 126/70 | HR 80 | Temp 97.5°F | Ht <= 58 in | Wt 186.0 lb

## 2022-07-04 DIAGNOSIS — M793 Panniculitis, unspecified: Secondary | ICD-10-CM

## 2022-07-04 DIAGNOSIS — I739 Peripheral vascular disease, unspecified: Secondary | ICD-10-CM | POA: Diagnosis not present

## 2022-07-04 NOTE — Assessment & Plan Note (Signed)
Follow with dermatology. Office notes reviewed from May 2024.  Continue Plaquenil 200 mg twice daily. Continue tacrolimus 0.1% twice daily as needed. Discussed purpose of Trental 400 mg tablets.

## 2022-07-04 NOTE — Patient Instructions (Signed)
You will either be contacted via phone regarding your referral for blood flow studies, or you may receive a letter on your MyChart portal from our referral team with instructions for scheduling an appointment. Please let us know if you have not been contacted by anyone within two weeks.  It was a pleasure to see you today!

## 2022-07-04 NOTE — Progress Notes (Signed)
Subjective:    Patient ID: Teresa Jensen, female    DOB: [birth year], 77 y.o.   MRN: [id number]  Leg Pain     Teresa Jensen is a very pleasant 77 y.o. female with a history of closed fracture of metatarsal bone, osteoarthritis, erythema of lower extremity, carpal tunnel syndrome, lower extremity pain, lipodermatosclerosis of lower extremities who presents today to discuss lower extremity pain and redness.  Diagnosed with lipodermatosclerosis per dermatology about 1 year ago and is treated with Plaquenil 200 mg BID, tacrolimus ointment BID. Last evaluated in May 2024, prescribed Trental 400 mg TID, but she hasn't begun this medication.   She continues to experience bilateral lower extremity pain to the thighs and shins, only with walking, improved with rest. She has not completed ABI's. She is a prior smoker.   She denies calf swelling, injury.  She continues to use her prescribed dermatology regimen without improvement in her redness or lower extremity pain.   Review of Systems  Respiratory:  Negative for shortness of breath.   Cardiovascular:  Negative for chest pain.       Intermittent claudication  Skin:  Positive for color change. Negative for wound.         Past Medical History:  Diagnosis Date   (HFpEF) heart failure with preserved ejection fraction (HCC) 06/28/2011   a.) TTE 06/28/2011: EF 55-60%, mild AR, G1DD; b.) TTE 01/16/2013: EF 55-60%, mild MR; c.) TTE 02/16/2018: EF 60-65%, G1DD; d.) MPI 11/26/2018: EF 56%   Abnormal electrocardiogram 11/12/2018   Acute kidney failure (HCC) 01/12/2017   Adenoma of left adrenal gland    Anemia    Anxiety    Aortic atherosclerosis (HCC)    Bilateral carpal tunnel syndrome    CAD (coronary artery disease)    a.) HR CT chest 10/30/2021: 2 vessel CAD (LAD/RCA)   CAP (community acquired pneumonia)    COPD (chronic obstructive pulmonary disease) (HCC)    COPD exacerbation (HCC) 01/21/2018   Diverticular disease of colon  11/01/2019   Diverticulosis    Dyspnea    DUE TO HEAT WITH COPD   Epistaxis 01/09/2017   GERD (gastroesophageal reflux disease)    Herpes zoster 07/08/2015   Hypertension    Hypothyroidism    Iatrogenic hypotension 01/12/2017   Iliac artery stenosis, bilateral (HCC)    Morbid obesity with BMI of 40.0-44.9, adult (HCC)    New onset of headaches    Osteopenia    PMR (polymyalgia rheumatica) (HCC)    Pneumonia 02/13/2018   PONV (postoperative nausea and vomiting)    Pulmonary nodule, left    a.) CT chest 10/30/2021: 1.5 cm medial LUL nodule; b.) PET CT 11/21/2021: hypermetabolic LUL nodule (SUX max 9.3)   S/P carpal tunnel release 08/04/2020   Shingles    Stasis dermatitis    Strain of chest wall 01/20/2011   Tobacco abuse    Vitamin D deficiency     Social History   Socioeconomic History   Marital status: Married    Spouse name: [name]   Number of children: 1   Years of education: Not on file   Highest education level: Not on file  Occupational History   Occupation: Catering manager as a Paramedic: RETIRED    Comment: retired  Tobacco Use   Smoking status: Former    Packs/day: 1.50    Years: 20.00    Additional pack years: 0.00    Total pack years: 30.00    Types:  Cigarettes    Quit date: 02/11/2009    Years since quitting: 13.4   Smokeless tobacco: Never  Vaping Use   Vaping Use: Never used  Substance and Sexual Activity   Alcohol use: No    Alcohol/week: 0.0 standard drinks of alcohol   Drug use: No   Sexual activity: Not Currently  Other Topics Concern   Not on file  Social History Narrative   Lives in [place] with her husband.  Takes care of her two grandchildren, ages 69 and 2, everyday in [place].  Has one son who is 45.   Desires CPR   Would not want prolonged life support if futile   Social Determinants of Health   Financial Resource Strain: Low Risk  (05/07/2022)   Overall Financial Resource Strain (CARDIA)    Difficulty of  Paying Living Expenses: Not hard at all  Food Insecurity: No Food Insecurity (05/07/2022)   Hunger Vital Sign    Worried About Running Out of Food in the Last Year: Never true    Ran Out of Food in the Last Year: Never true  Transportation Needs: No Transportation Needs (05/07/2022)   PRAPARE - Administrator, Civil Service (Medical): No    Lack of Transportation (Non-Medical): No  Physical Activity: Insufficiently Active (05/07/2022)   Exercise Vital Sign    Days of Exercise per Week: 3 days    Minutes of Exercise per Session: 30 min  Stress: No Stress Concern Present (05/07/2022)   [employer] of Occupational Health - Occupational Stress Questionnaire    Feeling of Stress : Not at all  Social Connections: Moderately Integrated (05/07/2022)   Social Connection and Isolation Panel [NHANES]    Frequency of Communication with Friends and Family: More than three times a week    Frequency of Social Gatherings with Friends and Family: More than three times a week    Attends Religious Services: More than 4 times per year    Active Member of [employer] or Organizations: No    Attends Banker Meetings: Never    Marital Status: Married  Catering manager Violence: Not At Risk (05/07/2022)   Humiliation, Afraid, Rape, and Kick questionnaire    Fear of Current or Ex-Partner: No    Emotionally Abused: No    Physically Abused: No    Sexually Abused: No    Past Surgical History:  Procedure Laterality Date   ANTERIOR CERVICAL DECOMP/DISCECTOMY FUSION  06/22/2004   C4-C6   APPENDECTOMY     CARPAL TUNNEL RELEASE Left 08/31/2019   Procedure: CARPAL TUNNEL RELEASE;  Surgeon: Donato Heinz, MD;  Location: [facility];  Service: Orthopedics;  Laterality: Left;   CARPAL TUNNEL RELEASE Right 06/22/2020   Procedure: CARPAL TUNNEL RELEASE;  Surgeon: Donato Heinz, MD;  Location: [facility];  Service: Orthopedics;  Laterality: Right;   CHOLECYSTECTOMY     COLONOSCOPY     ENDOBRONCHIAL  ULTRASOUND Left 01/17/2022   Procedure: ENDOBRONCHIAL ULTRASOUND;  Surgeon: Salena Saner, MD;  Location: [facility];  Service: Pulmonary;  Laterality: Left;   ESOPHAGOGASTRODUODENOSCOPY     HEMORRHOID SURGERY     TOTAL ABDOMINAL HYSTERECTOMY W/ BILATERAL SALPINGOOPHORECTOMY     TUBAL LIGATION      Family History  Problem Relation Age of Onset   Heart attack Mother 37   Cancer Father        lung/chest wall    Prostate cancer Father    Diabetes Brother  siblings   Hypertension Sister        x 2   Heart disease Brother    Breast cancer Paternal Grandmother    Heart disease Sister    Diabetes Sister    Tuberculosis Sister    Cirrhosis Brother     Allergies  Allergen Reactions   Epinephrine Anaphylaxis and Shortness Of Breath   Food Rash    MANGO = cause rashes around the mouth LIPS GO NUMB   Mang[employer]era [employer] Rash    THIS IS MANGO LIPS GO NUMB   Other Rash    MANGO = cause rashes around the mouth MANGO = cause rashes around the mouth LIPS GO NUMB   Prednisone Anaphylaxis, Hives, Shortness Of Breath, Itching and Swelling    The face swells HAS TAKEN CORTISONE SHOTS WITHOUT ISSUE BEFORE   Milk-Related Compounds Diarrhea    DAIRY PRODUCTS ARE A PROBLEM   Pneumococcal 13-Val Conj Vacc Itching and Other (See Comments)    Arm temp elevated at site of injection, red, itching   Prevnar 13 [Pneumococcal 13-Val Conj Vacc] Other (See Comments)    Arm temp elevated at site of injection, red, itching    Current Outpatient Medications on File Prior to Visit  Medication Sig Dispense Refill   amLODipine (NORVASC) 5 MG tablet TAKE 1 TABLET BY MOUTH EVERY DAY FOR BLOOD PRESSURE 90 tablet 3   Calcium Carb-Cholecalciferol (CALCIUM + VITAMIN D3 PO) Take 1,200 mg by mouth daily.     famotidine (PEPCID) 20 MG tablet One after supper 30 tablet 11   Glycopyrrolate-Formoterol (BEVESPI AEROSPHERE) 9-4.8 MCG/ACT AERO Inhale 2 puffs into the lungs 2 (two) times daily. 1 each 11    hydroxychloroquine (PLAQUENIL) 200 MG tablet Take one tab po BID. 180 tablet 1   levothyroxine (SYNTHROID) 75 MCG tablet TAKE 1 EVERY AM ON AN EMPTY STOMACH WITH WATER ONLY. NO FOOD OR OTHER MEDICATIONS FOR 30 MINUTES. 90 tablet 2   losartan (COZAAR) 100 MG tablet TAKE 1 TABLET BY MOUTH EVERY DAY 90 tablet 2   Multiple Vitamins-Minerals (PRESERVISION AREDS 2 PO) Take 1 tablet by mouth in the morning and at bedtime.     pantoprazole (PROTONIX) 20 MG tablet Take 1 tablet (20 mg total) by mouth daily. for heartburn. 90 tablet 3   pentoxifylline (TRENTAL) 400 MG CR tablet TAKE 1 TABLET BY MOUTH 3 TIMES DAILY WITH MEALS. 270 tablet 1   tacrolimus (PROTOPIC) 0.1 % ointment Apply to aa's lower legs BID PRN. 60 g 3   chlorpheniramine-HYDROcodone (TUSSIONEX) 10-8 MG/5ML Take 5 mLs by mouth every 12 (twelve) hours as needed for cough. (Patient not taking: Reported on 05/07/2022) 50 mL 0   No current facility-administered medications on file prior to visit.    BP 126/70   Pulse 80   Temp (!) 97.5 F (36.4 C) (Temporal)   Ht 4\' 9"  (1.448 m)   Wt 186 lb (84.4 kg)   SpO2 94%   BMI 40.25 kg/m  Objective:   Physical Exam Constitutional:      General: She is not in acute distress. Cardiovascular:     Rate and Rhythm: Normal rate.     Pulses:          Dorsalis pedis pulses are 2+ on the right side and 2+ on the left side.       Posterior tibial pulses are 2+ on the right side and 2+ on the left side.     Comments: Calf size bilaterally measuring 14.5 cm  in circumference Pulmonary:     Effort: Pulmonary effort is normal.  Skin:    General: Skin is warm and dry.     Findings: Erythema present.     Comments: Evidence of dependent rubor to right anterior lower extremity at shin level.  Mild redness and patchy form to left anterior shin.           Assessment & Plan:  Intermittent claudication [facility]) Assessment & Plan: Symptoms suggestive of eminent claudication. Presentation today  representative of PAD.  Will obtain ABIs, especially given her symptoms and prior smoking history. Lower suspicion for DVT.  Discussed the purpose of the Trental medication prescribed by dermatology. Await results.  Orders: -     VAS [place] LOWER EXT ART SEG MULTI (SEGMENTALS & LE RAYNAUDS); Future  Lipodermatosclerosis of both lower extremities Assessment & Plan: Follow with dermatology. Office notes reviewed from May 2024.  Continue Plaquenil 200 mg twice daily. Continue tacrolimus 0.1% twice daily as needed. Discussed purpose of Trental 400 mg tablets.         Doreene Nest, NP

## 2022-07-04 NOTE — Assessment & Plan Note (Signed)
Symptoms suggestive of eminent claudication. Presentation today representative of PAD.  Will obtain ABIs, especially given her symptoms and prior smoking history. Lower suspicion for DVT.  Discussed the purpose of the Trental medication prescribed by dermatology. Await results.

## 2022-07-12 ENCOUNTER — Ambulatory Visit
Admission: RE | Admit: 2022-07-12 | Discharge: 2022-07-12 | Disposition: A | Discharge status: Home / Self Care | Payer: Medicare Other | Source: Ambulatory Visit | Attending: Radiation Oncology | Admitting: Radiation Oncology

## 2022-07-12 DIAGNOSIS — C3412 Malignant neoplasm of upper lobe, left bronchus or lung: Secondary | ICD-10-CM | POA: Diagnosis not present

## 2022-07-12 MED ORDER — IOHEXOL 300 MG/ML  SOLN
75.0000 mL | Freq: Once | INTRAMUSCULAR | Status: AC | PRN
  Administered 2022-07-12: 75 mL via INTRAVENOUS

## 2022-07-14 ENCOUNTER — Ambulatory Visit
Admission: EM | Admit: 2022-07-14 | Discharge: 2022-07-14 | Disposition: A | Discharge status: Home / Self Care | Payer: Medicare Other | Attending: Physician Assistant | Admitting: Physician Assistant

## 2022-07-14 DIAGNOSIS — R21 Rash and other nonspecific skin eruption: Secondary | ICD-10-CM

## 2022-07-14 MED ORDER — HYDROCORTISONE 1 % EX CREA: TOPICAL_CREAM | CUTANEOUS | 0 refills | Status: DC

## 2022-07-14 NOTE — ED Provider Notes (Signed)
Renaldo Fiddler    CSN: [id number] Arrival date & time: 07/14/22  0945      History   Chief Complaint Chief Complaint  Patient presents with   Rash    HPI Teresa Jensen is a 77 y.o. female.   Patient presents with a rash to her upper chest around her neck that started yesterday.  She reports it is itchy.  She reports she had a CT with contrast on 711.  Reports no symptoms immediately following IV contrast.  She has never had trouble with IV contrast in the past.  Denies mouth tongue lip swelling, trouble swallowing or breathing.  Patient is on continuous O2 but did not bring with her to clinic today reports it is too cumbersome to take outside of the home.  O2 on room air is 90%.  In reviewing previous notes this appears stable for patient.  Patient reports she cannot tolerate prednisone.  BP Readings from Last 3 Encounters: 07/14/22 : 92/65 07/04/22 : 126/70 05/30/22 : 124/80      Past Medical History:  Diagnosis Date   (HFpEF) heart failure with preserved ejection fraction (HCC) 06/28/2011   a.) TTE 06/28/2011: EF 55-60%, mild AR, G1DD; b.) TTE 01/16/2013: EF 55-60%, mild MR; c.) TTE 02/16/2018: EF 60-65%, G1DD; d.) MPI 11/26/2018: EF 56%   Abnormal electrocardiogram 11/12/2018   Acute kidney failure (HCC) 01/12/2017   Adenoma of left adrenal gland    Anemia    Anxiety    Aortic atherosclerosis (HCC)    Bilateral carpal tunnel syndrome    CAD (coronary artery disease)    a.) HR CT chest 10/30/2021: 2 vessel CAD (LAD/RCA)   CAP (community acquired pneumonia)    COPD (chronic obstructive pulmonary disease) (HCC)    COPD exacerbation (HCC) 01/21/2018   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   Diverticulosis    Dyspnea    DUE TO HEAT WITH COPD   Epistaxis 01/09/2017   GERD (gastroesophageal reflux disease)    Herpes zoster 07/08/2015   Hypertension    Hypothyroidism    Iatrogenic hypotension 01/12/2017   Iliac artery stenosis, bilateral (HCC)    Morbid  obesity with BMI of 40.0-44.9, adult (HCC)    New onset of headaches    Osteopenia    PMR (polymyalgia rheumatica) (HCC)    Pneumonia 02/13/2018   PONV (postoperative nausea and vomiting)    Pulmonary nodule, left    a.) CT chest 10/30/2021: 1.5 cm medial LUL nodule; b.) PET CT 11/21/2021: hypermetabolic LUL nodule (SUX max 9.3)   S/P carpal tunnel release 08/04/2020   Shingles    Stasis dermatitis    Strain of chest wall 01/20/2011   Tobacco abuse    Vitamin D deficiency     Patient Active Problem List   Diagnosis Date Noted   Intermittent claudication (HCC) 07/04/2022   Lipodermatosclerosis of both lower extremities 02/22/2022   Solitary pulmonary nodule 11/27/2021   Pain of right upper extremity 08/04/2021   Lower extremity pain 08/04/2021   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   Acute cough 05/05/2021   Morbid obesity (HCC) 05/01/2021   Foot callus 09/01/2020   Rash and nonspecific skin eruption 07/07/2020   Personal history of colonic polyps 11/01/2019   Bilateral carpal tunnel syndrome 08/16/2019   Chronic heart failure with preserved ejection fraction (HFpEF) (HCC) 11/12/2018   DOE (dyspnea on exertion) 11/12/2018   Osteoporosis 12/16/2017   Low vitamin B12 level 01/13/2017   Essential hypertension 01/08/2017   Closed fracture  of metatarsal bone 09/28/2016   Prediabetes 10/17/2015   Pain of right lower extremity 05/20/2015   Medicare annual wellness visit, subsequent 10/12/2014   Occipital neuralgia 10/12/2014   Vitamin D deficiency 10/12/2014   Erythema of lower extremity 09/08/2013   COPD GOLD 2  06/05/2010   Hypothyroidism 09/29/2009   Situational anxiety 10/01/2008   COPD 10/01/2008   GERD 10/01/2008   Osteoarthritis 10/01/2008    Past Surgical History:  Procedure Laterality Date   ANTERIOR CERVICAL DECOMP/DISCECTOMY FUSION  06/22/2004   C4-C6   APPENDECTOMY     CARPAL TUNNEL RELEASE Left 08/31/2019   Procedure: CARPAL TUNNEL RELEASE;   Surgeon: Donato Heinz, MD;  Location: [facility];  Service: Orthopedics;  Laterality: Left;   CARPAL TUNNEL RELEASE Right 06/22/2020   Procedure: CARPAL TUNNEL RELEASE;  Surgeon: Donato Heinz, MD;  Location: [facility];  Service: Orthopedics;  Laterality: Right;   CHOLECYSTECTOMY     COLONOSCOPY     ENDOBRONCHIAL ULTRASOUND Left 01/17/2022   Procedure: ENDOBRONCHIAL ULTRASOUND;  Surgeon: Salena Saner, MD;  Location: [facility];  Service: Pulmonary;  Laterality: Left;   ESOPHAGOGASTRODUODENOSCOPY     HEMORRHOID SURGERY     TOTAL ABDOMINAL HYSTERECTOMY W/ BILATERAL SALPINGOOPHORECTOMY     TUBAL LIGATION      OB History   No obstetric history on file.      Home Medications    Prior to Admission medications   Medication Sig Start Date End Date Taking? Authorizing Provider  hydrocortisone cream 1 % Apply to affected area 2 times daily 07/14/22  Yes Ward, Tylene Fantasia, PA-C  amLODipine (NORVASC) 5 MG tablet TAKE 1 TABLET BY MOUTH EVERY DAY FOR BLOOD PRESSURE 02/24/22   Doreene Nest, NP  Calcium Carb-Cholecalciferol (CALCIUM + VITAMIN D3 PO) Take 1,200 mg by mouth daily.    [provider]  chlorpheniramine-HYDROcodone (TUSSIONEX) 10-8 MG/5ML Take 5 mLs by mouth every 12 (twelve) hours as needed for cough. Patient not taking: Reported on 05/07/2022 04/04/22   Doreene Nest, NP  famotidine (PEPCID) 20 MG tablet One after supper 09/07/21   Nyoka Cowden, MD  Glycopyrrolate-Formoterol (BEVESPI AEROSPHERE) 9-4.8 MCG/ACT AERO Inhale 2 puffs into the lungs 2 (two) times daily. 10/19/21   Nyoka Cowden, MD  hydroxychloroquine (PLAQUENIL) 200 MG tablet Take one tab po BID. 05/31/22   Moye, [place], MD  levothyroxine (SYNTHROID) 75 MCG tablet TAKE 1 EVERY AM ON AN EMPTY STOMACH WITH WATER ONLY. NO FOOD OR OTHER MEDICATIONS FOR 30 MINUTES. 05/20/22   Doreene Nest, NP  losartan (COZAAR) 100 MG tablet TAKE 1 TABLET BY MOUTH EVERY DAY 11/27/21   End, Cristal Deer, MD  Multiple  Vitamins-Minerals (PRESERVISION AREDS 2 PO) Take 1 tablet by mouth in the morning and at bedtime.    [provider]  pantoprazole (PROTONIX) 20 MG tablet Take 1 tablet (20 mg total) by mouth daily. for heartburn. 04/04/22   Doreene Nest, NP  pentoxifylline (TRENTAL) 400 MG CR tablet TAKE 1 TABLET BY MOUTH 3 TIMES DAILY WITH MEALS. 06/18/22   Willeen Niece, MD  tacrolimus (PROTOPIC) 0.1 % ointment Apply to aa's lower legs BID PRN. 05/17/22   Moye, [place], MD    Family History Family History  Problem Relation Age of Onset   Heart attack Mother 26   Cancer Father        lung/chest wall    Prostate cancer Father    Diabetes Brother        siblings  Hypertension Sister        x 2   Heart disease Brother    Breast cancer Paternal Grandmother    Heart disease Sister    Diabetes Sister    Tuberculosis Sister    Cirrhosis Brother     Social History Social History   Tobacco Use   Smoking status: Former    Current packs/day: 0.00    Average packs/day: 1.5 packs/day for 20.0 years (30.0 ttl pk-yrs)    Types: Cigarettes    Start date: 02/11/1989    Quit date: 02/11/2009    Years since quitting: 13.4   Smokeless tobacco: Never  Vaping Use   Vaping status: Never Used  Substance Use Topics   Alcohol use: No    Alcohol/week: 0.0 standard drinks of alcohol   Drug use: No     Allergies   Epinephrine, Food, Mangifera indica, Other, Prednisone, Milk-related compounds, Pneumococcal 13-val conj vacc, and Prevnar 13 [pneumococcal 13-val conj vacc]   Review of Systems Review of Systems  Constitutional:  Negative for chills and fever.  HENT:  Negative for ear pain and sore throat.   Eyes:  Negative for pain and visual disturbance.  Respiratory:  Negative for cough and shortness of breath.   Cardiovascular:  Negative for chest pain and palpitations.  Gastrointestinal:  Negative for abdominal pain and vomiting.  Genitourinary:  Negative for dysuria and hematuria.   Musculoskeletal:  Negative for arthralgias and back pain.  Skin:  Positive for rash. Negative for color change.  Neurological:  Negative for seizures and syncope.  All other systems reviewed and are negative.    Physical Exam Triage Vital Signs ED Triage Vitals  Encounter Vitals Group     BP 07/14/22 0952 92/65     Systolic BP Percentile --      Diastolic BP Percentile --      Pulse Rate 07/14/22 0952 89     Resp 07/14/22 0952 20     Temp 07/14/22 0952 98 F (36.7 C)     Temp src --      SpO2 07/14/22 0952 90 %     Weight --      Height --      Head Circumference --      Peak Flow --      Pain Score 07/14/22 0948 0     Pain Loc --      Pain Education --      Exclude from Growth Chart --    No data found.  Updated Vital Signs BP 92/65   Pulse 89   Temp 98 F (36.7 C)   Resp 20   SpO2 90%   Visual Acuity Right Eye Distance:   Left Eye Distance:   Bilateral Distance:    Right Eye Near:   Left Eye Near:    Bilateral Near:     Physical Exam Vitals and nursing note reviewed.  Constitutional:      General: She is not in acute distress.    Appearance: She is well-developed.  HENT:     Head: Normocephalic and atraumatic.  Eyes:     Conjunctiva/sclera: Conjunctivae normal.  Cardiovascular:     Rate and Rhythm: Normal rate and regular rhythm.     Heart sounds: No murmur heard. Pulmonary:     Effort: Pulmonary effort is normal. No respiratory distress.     Breath sounds: Normal breath sounds.  Abdominal:     Palpations: Abdomen is soft.     Tenderness:  There is no abdominal tenderness.  Musculoskeletal:        General: No swelling.     Cervical back: Neck supple.  Skin:    General: Skin is warm and dry.     Capillary Refill: Capillary refill takes less than 2 seconds.     Comments: Fine red macular rash to chest and neck.  Neurological:     Mental Status: She is alert.  Psychiatric:        Mood and Affect: Mood normal.      UC Treatments /  Results  Labs (all labs ordered are listed, but only abnormal results are displayed) Labs Reviewed - No data to display  EKG   Radiology No results found.  Procedures Procedures (including critical care time)  Medications Ordered in UC Medications - No data to display  Initial Impression / Assessment and Plan / UC Course  I have reviewed the triage vital signs and the nursing notes.  Pertinent labs & imaging results that were available during my care of the patient were reviewed by me and considered in my medical decision making (see chart for details).     Rash.  Patient cannot tolerate prednisone.  Advised Benadryl as needed.  Apply topical hydrocortisone cream.  Symptoms are unlikely from IV contrast given that it was 2 days ago but possible.  Airway clear.  No no other symptoms at this time.  Advise follow-up with PCP if no improvement. Final Clinical Impressions(s) / UC Diagnoses   Final diagnoses:  Rash and nonspecific skin eruption     Discharge Instructions      Can take benadryl as needed Can apply hydrocortisone cream  If no improvement or symptoms become worse return here for evaluation or follow up with your Primary Care Physician      ED Prescriptions     Medication Sig Dispense Auth. Provider   hydrocortisone cream 1 % Apply to affected area 2 times daily 15 g Ward, Tylene Fantasia, PA-C      PDMP not reviewed this encounter.   Ward, Tylene Fantasia, PA-C 07/14/22 1328

## 2022-07-14 NOTE — Discharge Instructions (Addendum)
Can take benadryl as needed Can apply hydrocortisone cream  If no improvement or symptoms become worse return here for evaluation or follow up with your Primary Care Physician

## 2022-07-14 NOTE — ED Triage Notes (Signed)
Patient to Urgent Care with complaints of rash present to chin/ chest that appeared yesterday morning. Areas are itchy and red.  Reports having a PET scan on Thursday w/ dye. Unsure if she has had the dye before. Denies any other potential allergen.

## 2022-07-15 ENCOUNTER — Ambulatory Visit: Payer: Self-pay

## 2022-07-17 ENCOUNTER — Other Ambulatory Visit: Payer: Medicare Other

## 2022-07-17 ENCOUNTER — Encounter: Payer: Self-pay | Admitting: Primary Care

## 2022-07-17 ENCOUNTER — Ambulatory Visit: Payer: Medicare Other | Admitting: Primary Care

## 2022-07-17 VITALS — BP 118/60 | HR 81 | Temp 98.0°F | Ht <= 58 in | Wt 186.0 lb

## 2022-07-17 DIAGNOSIS — M25511 Pain in right shoulder: Secondary | ICD-10-CM

## 2022-07-17 DIAGNOSIS — G8929 Other chronic pain: Secondary | ICD-10-CM

## 2022-07-17 DIAGNOSIS — T508X5A Adverse effect of diagnostic agents, initial encounter: Secondary | ICD-10-CM

## 2022-07-17 NOTE — Assessment & Plan Note (Signed)
Could be mild rotator cuff tear. Good range of motion so less likely bursitis.  Regardless, she needs further evaluation.  She declines x-ray today. Referral placed for physical therapy.

## 2022-07-17 NOTE — Assessment & Plan Note (Addendum)
HPI and symptoms today representative of allergic reaction. Fortunately, she has no airway symptoms or signs of early compromise.  She experiences anaphylaxis to prednisone, so we will avoid.  Start Zyrtec 10 mg at bedtime. Increase famotidine to 40 mg at bedtime.  She will update if symptoms are not improving. She will also update her radiology oncologist regarding the allergic reaction.  Urgent care notes reviewed.

## 2022-07-17 NOTE — Progress Notes (Signed)
Subjective:    Patient ID: Teresa Jensen, female    DOB: 1945-10-03, 77 y.o.   MRN: [id number]  HPI  Teresa Jensen is a very pleasant 77 y.o. female hypertension, CHF, COPD, lung cancer, hypothyroidism, prediabetes who presents today for urgent care follow-up and right upper extremity pain.  1) Rash: She presented to urgent care on 07/14/2022 for a rash to her upper chest and neck that began the day prior.  She underwent a CT scan of her chest 2 days prior, denies any rash or symptoms following the IV contrast.  She was noted to be mildly hypoxic with saturation of 90% on room air, did not bring her oxygen to that visit.  During her urgent care visit she was advised that she could take Benadryl and apply hydrocortisone cream.  She declined a prescription for prednisone as she is intolerant.  Since her urgent care visit she continues to experience a rash to the chest and face. Her rash is itchy. She does notice a funny feeling to her throat at times. She's been taking Benadryl and using OTC Cortisone cream without improvement.   She denies increased shortness of breath, rash elsewhere to her body, throat closure, wheezing. She is managed on famotidine 20 mg every night for heartburn.   She's not had IV contrast previously. She denies any other changes in her life including medications and foods.   2) Right Upper Extremity Pain/Shoulder Pain: Chronic to the right anterior shoulder for 3-6 months. Her pain will radiate to the right humeral region. She describes her pain as intermittent. Her pain is worse with movement and when sleeping at night.   Review of Systems  Constitutional:  Negative for fever.  HENT:  Negative for trouble swallowing.   Respiratory:  Negative for chest tightness, shortness of breath and wheezing.   Skin:  Positive for rash.         Past Medical History:  Diagnosis Date   (HFpEF) heart failure with preserved ejection fraction (HCC) 06/28/2011   a.) TTE  06/28/2011: EF 55-60%, mild AR, G1DD; b.) TTE 01/16/2013: EF 55-60%, mild MR; c.) TTE 02/16/2018: EF 60-65%, G1DD; d.) MPI 11/26/2018: EF 56%   Abnormal electrocardiogram 11/12/2018   Acute kidney failure (HCC) 01/12/2017   Adenoma of left adrenal gland    Anemia    Anxiety    Aortic atherosclerosis (HCC)    Bilateral carpal tunnel syndrome    CAD (coronary artery disease)    a.) HR CT chest 10/30/2021: 2 vessel CAD (LAD/RCA)   CAP (community acquired pneumonia)    COPD (chronic obstructive pulmonary disease) (HCC)    COPD exacerbation (HCC) 01/21/2018   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   Diverticulosis    Dyspnea    DUE TO HEAT WITH COPD   Epistaxis 01/09/2017   GERD (gastroesophageal reflux disease)    Herpes zoster 07/08/2015   Hypertension    Hypothyroidism    Iatrogenic hypotension 01/12/2017   Iliac artery stenosis, bilateral (HCC)    Morbid obesity with BMI of 40.0-44.9, adult (HCC)    New onset of headaches    Osteopenia    PMR (polymyalgia rheumatica) (HCC)    Pneumonia 02/13/2018   PONV (postoperative nausea and vomiting)    Pulmonary nodule, left    a.) CT chest 10/30/2021: 1.5 cm medial LUL nodule; b.) PET CT 11/21/2021: hypermetabolic LUL nodule (SUX max 9.3)   S/P carpal tunnel release 08/04/2020   Shingles    Stasis dermatitis  Strain of chest wall 01/20/2011   Tobacco abuse    Vitamin D deficiency     Social History   Socioeconomic History   Marital status: Married    Spouse name: [name]   Number of children: 1   Years of education: Not on file   Highest education level: Not on file  Occupational History   Occupation: Catering manager as a Paramedic: RETIRED    Comment: retired  Tobacco Use   Smoking status: Former    Current packs/day: 0.00    Average packs/day: 1.5 packs/day for 20.0 years (30.0 ttl pk-yrs)    Types: Cigarettes    Start date: 02/11/1989    Quit date: 02/11/2009    Years since quitting: 13.4    Smokeless tobacco: Never  Vaping Use   Vaping status: Never Used  Substance and Sexual Activity   Alcohol use: No    Alcohol/week: 0.0 standard drinks of alcohol   Drug use: No   Sexual activity: Not Currently  Other Topics Concern   Not on file  Social History Narrative   Lives in [place] with her husband.  Takes care of her two grandchildren, ages 52 and 2, everyday in [place].  Has one son who is 21.   Desires CPR   Would not want prolonged life support if futile   Social Determinants of Health   Financial Resource Strain: Low Risk  (05/07/2022)   Overall Financial Resource Strain (CARDIA)    Difficulty of Paying Living Expenses: Not hard at all  Food Insecurity: No Food Insecurity (05/07/2022)   Hunger Vital Sign    Worried About Running Out of Food in the Last Year: Never true    Ran Out of Food in the Last Year: Never true  Transportation Needs: No Transportation Needs (05/07/2022)   PRAPARE - Administrator, Civil Service (Medical): No    Lack of Transportation (Non-Medical): No  Physical Activity: Insufficiently Active (05/07/2022)   Exercise Vital Sign    Days of Exercise per Week: 3 days    Minutes of Exercise per Session: 30 min  Stress: No Stress Concern Present (05/07/2022)   [employer] of Occupational Health - Occupational Stress Questionnaire    Feeling of Stress : Not at all  Social Connections: Moderately Integrated (05/07/2022)   Social Connection and Isolation Panel [NHANES]    Frequency of Communication with Friends and Family: More than three times a week    Frequency of Social Gatherings with Friends and Family: More than three times a week    Attends Religious Services: More than 4 times per year    Active Member of [employer] or Organizations: No    Attends Banker Meetings: Never    Marital Status: Married  Catering manager Violence: Not At Risk (05/07/2022)   Humiliation, Afraid, Rape, and Kick questionnaire    Fear of Current or  Ex-Partner: No    Emotionally Abused: No    Physically Abused: No    Sexually Abused: No    Past Surgical History:  Procedure Laterality Date   ANTERIOR CERVICAL DECOMP/DISCECTOMY FUSION  06/22/2004   C4-C6   APPENDECTOMY     CARPAL TUNNEL RELEASE Left 08/31/2019   Procedure: CARPAL TUNNEL RELEASE;  Surgeon: Donato Heinz, MD;  Location: [facility];  Service: Orthopedics;  Laterality: Left;   CARPAL TUNNEL RELEASE Right 06/22/2020   Procedure: CARPAL TUNNEL RELEASE;  Surgeon: Donato Heinz, MD;  Location: [facility];  Service: Orthopedics;  Laterality: Right;   CHOLECYSTECTOMY     COLONOSCOPY     ENDOBRONCHIAL ULTRASOUND Left 01/17/2022   Procedure: ENDOBRONCHIAL ULTRASOUND;  Surgeon: Salena Saner, MD;  Location: [facility];  Service: Pulmonary;  Laterality: Left;   ESOPHAGOGASTRODUODENOSCOPY     HEMORRHOID SURGERY     TOTAL ABDOMINAL HYSTERECTOMY W/ BILATERAL SALPINGOOPHORECTOMY     TUBAL LIGATION      Family History  Problem Relation Age of Onset   Heart attack Mother 65   Cancer Father        lung/chest wall    Prostate cancer Father    Diabetes Brother        siblings   Hypertension Sister        x 2   Heart disease Brother    Breast cancer Paternal Grandmother    Heart disease Sister    Diabetes Sister    Tuberculosis Sister    Cirrhosis Brother     Allergies  Allergen Reactions   Epinephrine Anaphylaxis and Shortness Of Breath   Food Rash    MANGO = cause rashes around the mouth LIPS GO NUMB   [employer] Rash    THIS IS MANGO LIPS GO NUMB   Other Rash    MANGO = cause rashes around the mouth MANGO = cause rashes around the mouth LIPS GO NUMB   Prednisone Anaphylaxis, Hives, Shortness Of Breath, Itching and Swelling    The face swells HAS TAKEN CORTISONE SHOTS WITHOUT ISSUE BEFORE   Ivp Dye [Iodinated Contrast Media] Hives   Milk-Related Compounds Diarrhea    DAIRY PRODUCTS ARE A PROBLEM   Pneumococcal 13-Val Conj Vacc Itching and Other (See  Comments)    Arm temp elevated at site of injection, red, itching   Prevnar 13 [Pneumococcal 13-Val Conj Vacc] Other (See Comments)    Arm temp elevated at site of injection, red, itching    Current Outpatient Medications on File Prior to Visit  Medication Sig Dispense Refill   amLODipine (NORVASC) 5 MG tablet TAKE 1 TABLET BY MOUTH EVERY DAY FOR BLOOD PRESSURE 90 tablet 3   Calcium Carb-Cholecalciferol (CALCIUM + VITAMIN D3 PO) Take 1,200 mg by mouth daily.     famotidine (PEPCID) 20 MG tablet One after supper 30 tablet 11   Glycopyrrolate-Formoterol (BEVESPI AEROSPHERE) 9-4.8 MCG/ACT AERO Inhale 2 puffs into the lungs 2 (two) times daily. 1 each 11   hydrocortisone cream 1 % Apply to affected area 2 times daily 15 g 0   hydroxychloroquine (PLAQUENIL) 200 MG tablet Take one tab po BID. 180 tablet 1   levothyroxine (SYNTHROID) 75 MCG tablet TAKE 1 EVERY AM ON AN EMPTY STOMACH WITH WATER ONLY. NO FOOD OR OTHER MEDICATIONS FOR 30 MINUTES. 90 tablet 2   losartan (COZAAR) 100 MG tablet TAKE 1 TABLET BY MOUTH EVERY DAY 90 tablet 2   Multiple Vitamins-Minerals (PRESERVISION AREDS 2 PO) Take 1 tablet by mouth in the morning and at bedtime.     pantoprazole (PROTONIX) 20 MG tablet Take 1 tablet (20 mg total) by mouth daily. for heartburn. 90 tablet 3   pentoxifylline (TRENTAL) 400 MG CR tablet TAKE 1 TABLET BY MOUTH 3 TIMES DAILY WITH MEALS. 270 tablet 1   tacrolimus (PROTOPIC) 0.1 % ointment Apply to aa's lower legs BID PRN. 60 g 3   chlorpheniramine-HYDROcodone (TUSSIONEX) 10-8 MG/5ML Take 5 mLs by mouth every 12 (twelve) hours as needed for cough. (Patient not taking: Reported on 07/17/2022) 50 mL 0  No current facility-administered medications on file prior to visit.    BP 118/60   Pulse 81   Temp 98 F (36.7 C) (Temporal)   Ht 4\' 9"  (1.448 m)   Wt 186 lb (84.4 kg)   SpO2 93%   BMI 40.25 kg/m  Objective:   Physical Exam Cardiovascular:     Rate and Rhythm: Normal rate and regular  rhythm.  Pulmonary:     Effort: Pulmonary effort is normal.     Breath sounds: Normal breath sounds.  Musculoskeletal:     Cervical back: Neck supple.  Skin:    General: Skin is warm and dry.     Findings: Rash present.           Assessment & Plan:  Chronic right shoulder pain Assessment & Plan: Could be mild rotator cuff tear. Good range of motion so less likely bursitis.  Regardless, she needs further evaluation.  She declines x-ray today. Referral placed for physical therapy.  Orders: -     Ambulatory referral to Physical Therapy  Allergic reaction to contrast material, initial encounter Assessment & Plan: HPI and symptoms today representative of allergic reaction. Fortunately, she has no airway symptoms or signs of early compromise.  She experiences anaphylaxis to prednisone, so we will avoid.  Start Zyrtec 10 mg at bedtime. Increase famotidine to 40 mg at bedtime.  She will update if symptoms are not improving. She will also update her radiology oncologist regarding the allergic reaction.  Urgent care notes reviewed.         Doreene Nest, NP

## 2022-07-17 NOTE — Patient Instructions (Signed)
Increase your famotidine to 40 mg at bedtime.  Start taking Zyrtec 10 mg at bedtime.  Please update [place] if no improvement within the next few days. Go to the hospital sooner if you develop shortness of breath, wheezing, trouble breathing, worsening rash.  You will either be contacted via phone regarding your referral to physical threapy, or you may receive a letter on your MyChart portal from our referral team with instructions for scheduling an appointment. Please let us know if you have not been contacted by anyone within two weeks.  It was a pleasure to see you today!

## 2022-07-18 ENCOUNTER — Ambulatory Visit: Payer: Medicare Other | Admitting: Primary Care

## 2022-07-19 ENCOUNTER — Other Ambulatory Visit: Payer: Self-pay | Admitting: *Deleted

## 2022-07-19 ENCOUNTER — Ambulatory Visit
Admission: RE | Admit: 2022-07-19 | Discharge: 2022-07-19 | Disposition: A | Discharge status: Home / Self Care | Payer: Medicare Other | Source: Ambulatory Visit | Attending: Radiation Oncology | Admitting: Radiation Oncology

## 2022-07-19 ENCOUNTER — Encounter: Payer: Self-pay | Admitting: Radiation Oncology

## 2022-07-19 VITALS — BP 120/80 | HR 81 | Resp 14 | Ht <= 58 in | Wt 185.7 lb

## 2022-07-19 DIAGNOSIS — C3412 Malignant neoplasm of upper lobe, left bronchus or lung: Secondary | ICD-10-CM

## 2022-07-19 NOTE — Progress Notes (Signed)
Radiation Oncology Follow up Note  Name: Teresa Jensen   Date:   07/19/2022 MRN:  [id number] DOB: 1945/05/16    This 77 y.o. female presents to the clinic today for 1-month follow-up status post SBRT to her left upper lobe for stage I squamous cell carcinoma.  REFERRING PROVIDER: Doreene Nest, NP  HPI: Patient is a 77 year old female now out for months having completed SBRT to her left upper lobe for stage I squamous cell carcinoma.  Seen today in routine follow-up she is doing well.  She specifically denies any change in her pulmonary status marked increase in cough hemoptysis or chest tightness.  She had a recent CT scan.  Showing improving left upper lobe pulmonary nodule with no evidence of progression of disease or metastatic disease.  COMPLICATIONS OF TREATMENT: none  FOLLOW UP COMPLIANCE: keeps appointments   PHYSICAL EXAM:  BP 120/80   Pulse 81   Resp 14   Ht 4\' 9"  (1.448 m)   Wt 185 lb 11.2 oz (84.2 kg)   BMI 40.19 kg/m  Well-developed well-nourished patient in NAD. HEENT reveals PERLA, EOMI, discs not visualized.  Oral cavity is clear. No oral mucosal lesions are identified. Neck is clear without evidence of cervical or supraclavicular adenopathy. Lungs are clear to A&P. Cardiac examination is essentially unremarkable with regular rate and rhythm without murmur rub or thrill. Abdomen is benign with no organomegaly or masses noted. Motor sensory and DTR levels are equal and symmetric in the upper and lower extremities. Cranial nerves II through XII are grossly intact. Proprioception is intact. No peripheral adenopathy or edema is identified. No motor or sensory levels are noted. Crude visual fields are within normal range.  RADIOLOGY RESULTS: CT scan reviewed compatible with above-stated findings  PLAN: The present time patient is doing well by CT criteria.  And pleased with her overall progress.  Of asked to see her back in 6 months for follow-up.  Will do another CT  scan of her chest at that time.  Patient knows to call with any concerns.    Of I would like to take this opportunity to thank you for allowing me to participate in the care of your patient.Carmina Miller, MD

## 2022-07-23 ENCOUNTER — Ambulatory Visit (INDEPENDENT_AMBULATORY_CARE_PROVIDER_SITE_OTHER): Payer: Medicare Other | Admitting: Dermatology

## 2022-07-23 VITALS — BP 120/80

## 2022-07-23 DIAGNOSIS — M793 Panniculitis, unspecified: Secondary | ICD-10-CM

## 2022-07-23 NOTE — Patient Instructions (Addendum)
Continue compression socks daily Continue pentoxifylline 400 mg twice daily Continue tacrolimus 1-2 times daily as needed  Hydroxychloroquine (plaquenil) may cause sun sensitivity, so daily sun protection is recommended. It can rarely cause irreversible damage to the retina of the eye if used for a long period of time. This damage can be identified and the medication stopped before vision changes are noticed by going for a yearly ophthalmology exam. Rarely, this medicine can cause low blood counts, liver inflammation, low blood sugar, and/or a color change of the skin.  It should not be used together with certain medicines that can affect electrical conduction of the heart including macrolide antibiotics such as Azithromycin.  The use of Plaquenil requires long term medication management, including periodic office visits and monitoring of blood work.  Due to recent changes in healthcare laws, you may see results of your pathology and/or laboratory studies on MyChart before the doctors have had a chance to review them. We understand that in some cases there may be results that are confusing or concerning to you. Please understand that not all results are received at the same time and often the doctors may need to interpret multiple results in order to provide you with the best plan of care or course of treatment. Therefore, we ask that you please give [place] 2 business days to thoroughly review all your results before contacting the office for clarification. Should we see a critical lab result, you will be contacted sooner.   If You Need Anything After Your Visit  If you have any questions or concerns for your doctor, please call our main line at [phone] and press option 4 to reach your doctor's medical assistant. If no one answers, please leave a voicemail as directed and we will return your call as soon as possible. Messages left after 4 pm will be answered the following business day.   You may also  send [place] a message via MyChart. We typically respond to MyChart messages within 1-2 business days.  For prescription refills, please ask your pharmacy to contact our office. Our fax number is [phone].  If you have an urgent issue when the clinic is closed that cannot wait until the next business day, you can Kunst your doctor at the number below.    Please note that while we do our best to be available for urgent issues outside of office hours, we are not available 24/7.   If you have an urgent issue and are unable to reach [place], you may choose to seek medical care at your doctor's office, retail clinic, urgent care center, or emergency room.  If you have a medical emergency, please immediately call 911 or go to the emergency department.  Pager Numbers  - Dr. Gwen Pounds: [phone]  - Dr. Neale Burly: [phone]  - Dr. Roseanne Reno: [phone]  In the event of inclement weather, please call our main line at [phone] for an update on the status of any delays or closures.  Dermatology Medication Tips: Please keep the boxes that topical medications come in in order to help keep track of the instructions about where and how to use these. Pharmacies typically print the medication instructions only on the boxes and not directly on the medication tubes.   If your medication is too expensive, please contact our office at [phone] option 4 or send [place] a message through MyChart.   We are unable to tell what your co-pay for medications will be in advance as this is different depending on  your insurance coverage. However, we may be able to find a substitute medication at lower cost or fill out paperwork to get insurance to cover a needed medication.   If a prior authorization is required to get your medication covered by your insurance company, please allow [place] 1-2 business days to complete this process.  Drug prices often vary depending on where the prescription is filled and some pharmacies may  offer cheaper prices.  The website www.goodrx.com contains coupons for medications through different pharmacies. The prices here do not account for what the cost may be with help from insurance (it may be cheaper with your insurance), but the website can give you the price if you did not use any insurance.  - You can print the associated coupon and take it with your prescription to the pharmacy.  - You may also stop by our office during regular business hours and pick up a GoodRx coupon card.  - If you need your prescription sent electronically to a different pharmacy, notify our office through [facility] or by phone at [phone] option 4.   Health o por telfono llamando al [phone] y presione la opcin 4.

## 2022-07-23 NOTE — Progress Notes (Signed)
   Follow-Up Visit   Subjective  Teresa Jensen is a 77 y.o. female who presents for the following: lipodermatosclerosis. Patient using tacrolimus 1-2 times daily. She is also taking pentoxifylline 400 mg twice daily and hydroxychloroquine 200 mg twice daily. She also wears compression socks daily. Patient unable to tolerate pentoxifylline 400 mg 3x daily due to diarrhea.   Patient was diagnosed with lung cancer in February and has completed radiation. Recent imaging shows stability to improvement in LUL lesion and plan is for 6 month follow up (01/24/23). Patient scheduled to have blood flow checked in lower legs.   The following portions of the chart were reviewed this encounter and updated as appropriate: medications, allergies, medical history  Review of Systems:  No other skin or systemic complaints except as noted in HPI or Assessment and Plan.  Objective  Well appearing patient in no apparent distress; mood and affect are within normal limits.   A focused examination was performed of the following areas: legs  Relevant exam findings are noted in the Assessment and Plan.  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 Skin of lower legs does not appear inflamed on exam (no stasis dermatitis). Lack of dermatitis means the benefit of HCQ is relatively low. Suspect tenderness is secondary to chronic edema rather than inflammation. Risk of retinal toxicity in the setting of known macular degeneration and  possibility of difficulty interpreting eye exam while on HCQ makes its risks outweigh benefits. Known COPD with home oxygen use means RBC and HGB levels are critical, and HCQ can rarely drop blood counts. HCQ can cause GI upset and could be contributing to diarrhea. Thus, jointly decided to discontinue hydroxychloroquine.  Continue compression socks daily Continue pentoxifylline 400 mg twice daily, previously reduced from TID due to diarrhea Continue tacrolimus 1-2 times daily as needed Patient will contact us if worsening before follow up STOP hydroxychloroquine, patient will tell ophthalmologist Dr Galen Manila     Return in about 6 months (around 01/23/2023) for lipodermatosclerosis.  Anise Salvo, RMA, am acting as scribe for Elie Goody, MD .   Documentation: I have reviewed the above documentation for accuracy and completeness, and I agree with the above.  Elie Goody, MD

## 2022-07-25 ENCOUNTER — Telehealth: Payer: Self-pay | Admitting: Pulmonary Disease

## 2022-07-25 NOTE — Telephone Encounter (Signed)
Dr. Jayme Cloud this patient was seen by Dr. Sherene Sires on 10/19/21 and he order a PFT for her.  She currently has an appt with you on 08/15/22 should I try to get the PFT scheduled to be done before that appt

## 2022-07-25 NOTE — Telephone Encounter (Signed)
I have spoke with Teresa Jensen and her PFT has been scheduled on 08/09/22 @ 8:00am [facility]. I have mailed her the instructions

## 2022-07-25 NOTE — Telephone Encounter (Signed)
PFT before the appointment would be very helpful.

## 2022-08-06 ENCOUNTER — Telehealth: Payer: Self-pay | Admitting: Pulmonary Disease

## 2022-08-06 NOTE — Telephone Encounter (Signed)
Rep called in requesting CPT codes for the Visit on 02/12/12024 Ref# [id number]

## 2022-08-06 NOTE — Telephone Encounter (Signed)
I called [employer] and spoke with Shanda Bumps. I gave her the requestd information nothing further needed at this time

## 2022-08-08 ENCOUNTER — Encounter: Payer: Self-pay | Admitting: Primary Care

## 2022-08-08 ENCOUNTER — Ambulatory Visit (INDEPENDENT_AMBULATORY_CARE_PROVIDER_SITE_OTHER): Payer: Medicare Other | Admitting: Primary Care

## 2022-08-08 VITALS — BP 110/62 | HR 75 | Temp 97.4°F | Ht <= 58 in | Wt 185.0 lb

## 2022-08-08 DIAGNOSIS — E038 Other specified hypothyroidism: Secondary | ICD-10-CM

## 2022-08-08 DIAGNOSIS — G5603 Carpal tunnel syndrome, bilateral upper limbs: Secondary | ICD-10-CM

## 2022-08-08 DIAGNOSIS — I5032 Chronic diastolic (congestive) heart failure: Secondary | ICD-10-CM | POA: Diagnosis not present

## 2022-08-08 DIAGNOSIS — J449 Chronic obstructive pulmonary disease, unspecified: Secondary | ICD-10-CM | POA: Diagnosis not present

## 2022-08-08 DIAGNOSIS — K219 Gastro-esophageal reflux disease without esophagitis: Secondary | ICD-10-CM

## 2022-08-08 DIAGNOSIS — J8410 Pulmonary fibrosis, unspecified: Secondary | ICD-10-CM | POA: Diagnosis not present

## 2022-08-08 DIAGNOSIS — I10 Essential (primary) hypertension: Secondary | ICD-10-CM | POA: Diagnosis not present

## 2022-08-08 DIAGNOSIS — R7303 Prediabetes: Secondary | ICD-10-CM

## 2022-08-08 DIAGNOSIS — F418 Other specified anxiety disorders: Secondary | ICD-10-CM

## 2022-08-08 DIAGNOSIS — M793 Panniculitis, unspecified: Secondary | ICD-10-CM

## 2022-08-08 DIAGNOSIS — M810 Age-related osteoporosis without current pathological fracture: Secondary | ICD-10-CM

## 2022-08-08 MED ORDER — PANTOPRAZOLE SODIUM 40 MG PO TBEC
40.0000 mg | DELAYED_RELEASE_TABLET | Freq: Every day | ORAL | 3 refills | Status: AC
Start: 2022-08-08 — End: ?

## 2022-08-08 NOTE — Assessment & Plan Note (Signed)
Controlled.  Continue losartan 100 mg daily, amlodipine 5 mg daily. Reviewed CMP from May 2024.

## 2022-08-08 NOTE — Progress Notes (Signed)
Subjective:    Patient ID: Teresa Jensen, female    DOB: [birth year], 77 y.o.   MRN: [id number]  HPI  Teresa Jensen is a very pleasant 77 y.o. female with a history of hypertension, CHF, COPD, pulmonary fibrosis, hypothyroidism, osteoporosis, prediabetes, who presents today for follow-up of chronic conditions.  Immunizations: -Tetanus: Completed in 2010 -Shingles: Never completed due to cost. She will inquire with the pharmacy.  -Pneumonia: Completed Prevnar 13 in 2016  Mammogram: July 2023, she will schedule.  Bone Density Scan: March 2022, she will schedule.   Colonoscopy: Completed in 2013. Completed Cologuard in May 2024 which was positive.   1) COPD/Pulmonary Fibrosis/Squamous Cell Carcinoma: Following with pulmonology and radiology oncology.  She is status post SBRT.  Last office visit with pulmonology was in May 2024.  She underwent repeat CT chest in July 2024 which showed improving left upper lobe pulmonary nodule since radiation, stable small mediastinal nodes favored to be reactive.  She is currently managed on Bevespi inhaler 9-4.8 mcg. 2 puffs BID for her COPD. She will undergo PFTs tomorrow.   2) GERD: Currently managed on pantoprazole 20 mg daily and famotidine 20 mg daily.  3) Hypertension: Currently managed on losartan 100 mg daily, amlodipine 5 mg daily. She denies chest pain, headaches, dizziness.   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  Following with dermatology and is managed on Trental 400 mg 3 times daily, tacrolimus ointment.  5) Hypothyroidism: Currently managed on levothyroxine 75 mcg daily. She is taking every morning on an empty stomach with water only.   No food or other medications for 30 minutes.   No heartburn medication, iron pills, calcium, vitamin D, or magnesium pills within four hours of taking levothyroxine.       Review of Systems  Constitutional:  Negative for unexpected weight change.  HENT:  Negative for rhinorrhea.   Respiratory:  Negative for cough and shortness of breath.   Cardiovascular:  Negative for chest pain.  Gastrointestinal:  Negative for constipation and diarrhea.  Genitourinary:  Negative for difficulty urinating.  Musculoskeletal:  Negative for arthralgias and myalgias.  Skin:  Negative for rash.  Allergic/Immunologic: Negative for environmental allergies.  Neurological:  Negative for dizziness and headaches.  Psychiatric/Behavioral:  The patient is nervous/anxious.          Past Medical History:  Diagnosis Date   (HFpEF) heart failure with preserved ejection fraction (HCC) 06/28/2011   a.) TTE 06/28/2011: EF 55-60%, mild AR, G1DD; b.) TTE 01/16/2013: EF 55-60%, mild MR; c.) TTE 02/16/2018: EF 60-65%, G1DD; d.) MPI 11/26/2018: EF 56%   Abnormal electrocardiogram 11/12/2018   Acute kidney failure (HCC) 01/12/2017   Adenoma of left adrenal gland    Anemia    Anxiety    Aortic atherosclerosis (HCC)    Bilateral carpal tunnel syndrome    CAD (coronary artery disease)    a.) HR CT chest 10/30/2021: 2 vessel CAD (LAD/RCA)   CAP (community acquired pneumonia)    COPD (chronic obstructive pulmonary disease) (HCC)    COPD exacerbation (HCC) 01/21/2018   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   Diverticulosis    Dyspnea    DUE TO HEAT WITH COPD   Epistaxis 01/09/2017   GERD (gastroesophageal reflux disease)    Herpes zoster 07/08/2015   Hypertension    Hypothyroidism    Iatrogenic hypotension 01/12/2017   Iliac artery  stenosis, bilateral (HCC)    Morbid obesity with BMI of 40.0-44.9, adult (HCC)    New onset of headaches    Osteopenia    PMR (polymyalgia rheumatica) (HCC)    Pneumonia 02/13/2018   PONV (postoperative nausea and vomiting)    Pulmonary nodule, left    a.) CT chest 10/30/2021: 1.5 cm medial LUL nodule; b.) PET CT 11/21/2021: hypermetabolic  LUL nodule (SUX max 9.3)   S/P carpal tunnel release 08/04/2020   Shingles    Stasis dermatitis    Strain of chest wall 01/20/2011   Tobacco abuse    Vitamin D deficiency     Social History   Socioeconomic History   Marital status: Married    Spouse name: [name]   Number of children: 1   Years of education: Not on file   Highest education level: Not on file  Occupational History   Occupation: Catering manager as a Paramedic: RETIRED    Comment: retired  Tobacco Use   Smoking status: Former    Current packs/day: 0.00    Average packs/day: 1.5 packs/day for 20.0 years (30.0 ttl pk-yrs)    Types: Cigarettes    Start date: 02/11/1989    Quit date: 02/11/2009    Years since quitting: 13.4   Smokeless tobacco: Never  Vaping Use   Vaping status: Never Used  Substance and Sexual Activity   Alcohol use: No    Alcohol/week: 0.0 standard drinks of alcohol   Drug use: No   Sexual activity: Not Currently  Other Topics Concern   Not on file  Social History Narrative   Lives in [place] with her husband.  Takes care of her two grandchildren, ages 37 and 2, everyday in [place].  Has one son who is 27.   Desires CPR   Would not want prolonged life support if futile   Social Determinants of Health   Financial Resource Strain: Low Risk  (05/07/2022)   Overall Financial Resource Strain (CARDIA)    Difficulty of Paying Living Expenses: Not hard at all  Food Insecurity: No Food Insecurity (05/07/2022)   Hunger Vital Sign    Worried About Running Out of Food in the Last Year: Never true    Ran Out of Food in the Last Year: Never true  Transportation Needs: No Transportation Needs (05/07/2022)   PRAPARE - Administrator, Civil Service (Medical): No    Lack of Transportation (Non-Medical): No  Physical Activity: Insufficiently Active (05/07/2022)   Exercise Vital Sign    Days of Exercise per Week: 3 days    Minutes of Exercise per Session: 30 min  Stress: No  Stress Concern Present (05/07/2022)   [employer] of Occupational Health - Occupational Stress Questionnaire    Feeling of Stress : Not at all  Social Connections: Moderately Integrated (05/07/2022)   Social Connection and Isolation Panel [NHANES]    Frequency of Communication with Friends and Family: More than three times a week    Frequency of Social Gatherings with Friends and Family: More than three times a week    Attends Religious Services: More than 4 times per year    Active Member of [employer] or Organizations: No    Attends Banker Meetings: Never    Marital Status: Married  Catering manager Violence: Not At Risk (05/07/2022)   Humiliation, Afraid, Rape, and Kick questionnaire    Fear of Current or Ex-Partner: No    Emotionally Abused: No  Physically Abused: No    Sexually Abused: No    Past Surgical History:  Procedure Laterality Date   ANTERIOR CERVICAL DECOMP/DISCECTOMY FUSION  06/22/2004   C4-C6   APPENDECTOMY     CARPAL TUNNEL RELEASE Left 08/31/2019   Procedure: CARPAL TUNNEL RELEASE;  Surgeon: Donato Heinz, MD;  Location: [facility];  Service: Orthopedics;  Laterality: Left;   CARPAL TUNNEL RELEASE Right 06/22/2020   Procedure: CARPAL TUNNEL RELEASE;  Surgeon: Donato Heinz, MD;  Location: [facility];  Service: Orthopedics;  Laterality: Right;   CHOLECYSTECTOMY     COLONOSCOPY     ENDOBRONCHIAL ULTRASOUND Left 01/17/2022   Procedure: ENDOBRONCHIAL ULTRASOUND;  Surgeon: Salena Saner, MD;  Location: [facility];  Service: Pulmonary;  Laterality: Left;   ESOPHAGOGASTRODUODENOSCOPY     HEMORRHOID SURGERY     TOTAL ABDOMINAL HYSTERECTOMY W/ BILATERAL SALPINGOOPHORECTOMY     TUBAL LIGATION      Family History  Problem Relation Age of Onset   Heart attack Mother 63   Cancer Father        lung/chest wall    Prostate cancer Father    Diabetes Brother        siblings   Hypertension Sister        x 2   Heart disease Brother    Breast cancer  Paternal Grandmother    Heart disease Sister    Diabetes Sister    Tuberculosis Sister    Cirrhosis Brother     Allergies  Allergen Reactions   Epinephrine Anaphylaxis and Shortness Of Breath   Food Rash    MANGO = cause rashes around the mouth LIPS GO NUMB   [employer] Rash    THIS IS MANGO LIPS GO NUMB   Other Rash    MANGO = cause rashes around the mouth MANGO = cause rashes around the mouth LIPS GO NUMB   Prednisone Anaphylaxis, Hives, Shortness Of Breath, Itching and Swelling    The face swells HAS TAKEN CORTISONE SHOTS WITHOUT ISSUE BEFORE   Ivp Dye [Iodinated Contrast Media] Hives   Milk-Related Compounds Diarrhea    DAIRY PRODUCTS ARE A PROBLEM   Pneumococcal 13-Val Conj Vacc Itching and Other (See Comments)    Arm temp elevated at site of injection, red, itching   Prevnar 13 [Pneumococcal 13-Val Conj Vacc] Other (See Comments)    Arm temp elevated at site of injection, red, itching    Current Outpatient Medications on File Prior to Visit  Medication Sig Dispense Refill   amLODipine (NORVASC) 5 MG tablet TAKE 1 TABLET BY MOUTH EVERY DAY FOR BLOOD PRESSURE 90 tablet 3   Calcium Carb-Cholecalciferol (CALCIUM + VITAMIN D3 PO) Take 1,200 mg by mouth daily.     famotidine (PEPCID) 20 MG tablet One after supper 30 tablet 11   Glycopyrrolate-Formoterol (BEVESPI AEROSPHERE) 9-4.8 MCG/ACT AERO Inhale 2 puffs into the lungs 2 (two) times daily. 1 each 11   levothyroxine (SYNTHROID) 75 MCG tablet TAKE 1 EVERY AM ON AN EMPTY STOMACH WITH WATER ONLY. NO FOOD OR OTHER MEDICATIONS FOR 30 MINUTES. 90 tablet 2   losartan (COZAAR) 100 MG tablet TAKE 1 TABLET BY MOUTH EVERY DAY 90 tablet 2   Multiple Vitamins-Minerals (PRESERVISION AREDS 2 PO) Take 1 tablet by mouth in the morning and at bedtime.     pentoxifylline (TRENTAL) 400 MG CR tablet TAKE 1 TABLET BY MOUTH 3 TIMES DAILY WITH MEALS. 270 tablet 1   tacrolimus (PROTOPIC) 0.1 % ointment Apply to aa's  lower legs BID PRN. 60 g  3   No current facility-administered medications on file prior to visit.    BP 110/62   Pulse 75   Temp (!) 97.4 F (36.3 C) (Temporal)   Ht 4\' 9"  (1.448 m)   Wt 185 lb (83.9 kg)   SpO2 93%   BMI 40.03 kg/m  Objective:   Physical Exam HENT:     Right Ear: Tympanic membrane and ear canal normal.     Left Ear: Tympanic membrane and ear canal normal.     Nose: Nose normal.  Eyes:     Conjunctiva/sclera: Conjunctivae normal.     Pupils: Pupils are equal, round, and reactive to light.  Neck:     Thyroid: No thyromegaly.  Cardiovascular:     Rate and Rhythm: Normal rate and regular rhythm.     Heart sounds: No murmur heard. Pulmonary:     Effort: Pulmonary effort is normal.     Breath sounds: Normal breath sounds. No rales.  Abdominal:     General: Bowel sounds are normal.     Palpations: Abdomen is soft.     Tenderness: There is no abdominal tenderness.  Musculoskeletal:        General: Normal range of motion.     Cervical back: Neck supple.  Lymphadenopathy:     Cervical: No cervical adenopathy.  Skin:    General: Skin is warm and dry.     Findings: Rash present.  Neurological:     Mental Status: She is alert and oriented to person, place, and time.     Cranial Nerves: No cranial nerve deficit.     Deep Tendon Reflexes: Reflexes are normal and symmetric.  Psychiatric:        Mood and Affect: Mood normal.           Assessment & Plan:  Chronic heart failure with preserved ejection fraction (HFpEF) (HCC) Assessment & Plan: Appears euvolemic today.  Reviewed office notes from January and March 2024.   Essential hypertension Assessment & Plan: Controlled.  Continue losartan 100 mg daily, amlodipine 5 mg daily. Reviewed CMP from May 2024.   COPD GOLD 2  Assessment & Plan: Stable.  Reviewed pulmonology notes from May 2024.  Continue Bevespi 9-4.8 mcg inhaler daily. Reviewed CT chest from July 2024.  Proceed with PFTs as scheduled.     Postinflammatory pulmonary fibrosis [facility]) Assessment & Plan: Following with pulmonology, reviewed office notes from May 2024.  Continue Bevespi inhaler, 2 puffs BID. Reviewed CT chest from July 2024   Gastroesophageal reflux disease, unspecified whether esophagitis present Assessment & Plan: Remains uncontrolled despite treatment.  Increase pantoprazole to 40 mg daily. Continue famotidine 20 mg daily.   She will update  Orders: -     Pantoprazole Sodium; Take 1 tablet (40 mg total) by mouth daily. for heartburn.  Dispense: 90 tablet; Refill: 3  Other specified hypothyroidism Assessment & Plan: She is taking levothyroxine correctly. Continue levothyroxine 75 mcg daily. Reviewed TSH from February 2024   Bilateral carpal tunnel syndrome Assessment & Plan: Chronic and continued. Declines further work up at this time. Continue with wearing braces.    Lipodermatosclerosis of both lower extremities Assessment & Plan: Following with dermatology, reviewed office notes from May 2024  Continue tacrolimus 0.1% ointment, Trental 400 mg TID.   Osteoporosis, unspecified osteoporosis type, unspecified pathological fracture presence Assessment & Plan: She will schedule bone density scan. Continue calcium and vitamin D. Encouraged daily walking.   Prediabetes Assessment &  Plan: Well-controlled. Reviewed A1c from February 2024. She declines lab work today.   Situational anxiety Assessment & Plan: Stable. Continue to monitor closely.         Doreene Nest, NP

## 2022-08-08 NOTE — Assessment & Plan Note (Signed)
Chronic and continued. Declines further work up at this time. Continue with wearing braces.

## 2022-08-08 NOTE — Assessment & Plan Note (Signed)
Stable. Continue to monitor closely.  

## 2022-08-08 NOTE — Assessment & Plan Note (Signed)
Stable.  Reviewed pulmonology notes from May 2024.  Continue Bevespi 9-4.8 mcg inhaler daily. Reviewed CT chest from July 2024.  Proceed with PFTs as scheduled.

## 2022-08-08 NOTE — Assessment & Plan Note (Signed)
Following with pulmonology, reviewed office notes from May 2024.  Continue Bevespi inhaler, 2 puffs BID. Reviewed CT chest from July 2024

## 2022-08-08 NOTE — Assessment & Plan Note (Signed)
Remains uncontrolled despite treatment.  Increase pantoprazole to 40 mg daily. Continue famotidine 20 mg daily.   She will update

## 2022-08-08 NOTE — Assessment & Plan Note (Signed)
Following with dermatology, reviewed office notes from May 2024  Continue tacrolimus 0.1% ointment, Trental 400 mg TID.

## 2022-08-08 NOTE — Assessment & Plan Note (Addendum)
Well-controlled. Reviewed A1c from February 2024. She declines lab work today.

## 2022-08-08 NOTE — Assessment & Plan Note (Signed)
Appears euvolemic today.  Reviewed office notes from January and March 2024.

## 2022-08-08 NOTE — Assessment & Plan Note (Signed)
She will schedule bone density scan. Continue calcium and vitamin D. Encouraged daily walking.

## 2022-08-08 NOTE — Patient Instructions (Addendum)
Call to schedule your mammogram and bone density scan.  Schedule your colonoscopy.  It was a pleasure to see you today!

## 2022-08-08 NOTE — Assessment & Plan Note (Signed)
She is taking levothyroxine correctly. Continue levothyroxine 75 mcg daily. Reviewed TSH from February 2024

## 2022-08-09 ENCOUNTER — Ambulatory Visit: Payer: Medicare Other | Attending: Internal Medicine

## 2022-08-09 DIAGNOSIS — J8410 Pulmonary fibrosis, unspecified: Secondary | ICD-10-CM | POA: Insufficient documentation

## 2022-08-09 DIAGNOSIS — J449 Chronic obstructive pulmonary disease, unspecified: Secondary | ICD-10-CM | POA: Insufficient documentation

## 2022-08-09 LAB — PULMONARY FUNCTION TEST ARMC ONLY
DL/VA % pred: 54 %
DL/VA: 2.3 ml/min/mmHg/L
DLCO unc % pred: 41 %
DLCO unc: 6.55 ml/min/mmHg
FEF 25-75 Post: 0.43 L/sec
FEF 25-75 Pre: 0.33 L/sec
FEF2575-%Change-Post: 28 %
FEF2575-%Pred-Post: 33 %
FEF2575-%Pred-Pre: 25 %
FEV1-%Change-Post: 8 %
FEV1-%Pred-Post: 54 %
FEV1-%Pred-Pre: 50 %
FEV1-Post: 0.86 L
FEV1-Pre: 0.79 L
FEV1FVC-%Change-Post: 0 %
FEV1FVC-%Pred-Pre: 79 %
FEV6-%Change-Post: 9 %
FEV6-%Pred-Post: 72 %
FEV6-%Pred-Pre: 65 %
FEV6-Post: 1.46 L
FEV6-Pre: 1.33 L
FEV6FVC-%Change-Post: 0 %
FEV6FVC-%Pred-Post: 105 %
FEV6FVC-%Pred-Pre: 105 %
FVC-%Change-Post: 9 %
FVC-%Pred-Post: 68 %
FVC-%Pred-Pre: 62 %
FVC-Post: 1.46 L
FVC-Pre: 1.34 L
Post FEV1/FVC ratio: 59 %
Post FEV6/FVC ratio: 100 %
Pre FEV1/FVC ratio: 59 %
Pre FEV6/FVC Ratio: 100 %
RV % pred: 133 %
RV: 2.78 L
TLC % pred: 98 %
TLC: 4.24 L

## 2022-08-09 MED ORDER — ALBUTEROL SULFATE (2.5 MG/3ML) 0.083% IN NEBU
2.5000 mg | INHALATION_SOLUTION | Freq: Once | RESPIRATORY_TRACT | Status: AC
  Administered 2022-08-09: 2.5 mg via RESPIRATORY_TRACT
  Filled 2022-08-09: qty 3

## 2022-08-15 ENCOUNTER — Encounter: Payer: Self-pay | Admitting: Pulmonary Disease

## 2022-08-15 ENCOUNTER — Ambulatory Visit (INDEPENDENT_AMBULATORY_CARE_PROVIDER_SITE_OTHER): Payer: Medicare Other | Admitting: Pulmonary Disease

## 2022-08-15 VITALS — BP 128/82 | HR 92 | Temp 98.2°F | Ht <= 58 in | Wt 185.6 lb

## 2022-08-15 DIAGNOSIS — J449 Chronic obstructive pulmonary disease, unspecified: Secondary | ICD-10-CM

## 2022-08-15 DIAGNOSIS — R195 Other fecal abnormalities: Secondary | ICD-10-CM

## 2022-08-15 DIAGNOSIS — J9611 Chronic respiratory failure with hypoxia: Secondary | ICD-10-CM | POA: Diagnosis not present

## 2022-08-15 DIAGNOSIS — C3412 Malignant neoplasm of upper lobe, left bronchus or lung: Secondary | ICD-10-CM

## 2022-08-15 DIAGNOSIS — J8410 Pulmonary fibrosis, unspecified: Secondary | ICD-10-CM

## 2022-08-15 MED ORDER — LEVALBUTEROL TARTRATE 45 MCG/ACT IN AERO: 2.0000 | INHALATION_SPRAY | RESPIRATORY_TRACT | 12 refills | Status: DC | PRN

## 2022-08-15 NOTE — Progress Notes (Unsigned)
Subjective:    Patient ID: Teresa Jensen, female    DOB: 03/18/1945, 77 y.o.   MRN: [id number]  Patient Care Team: Doreene Nest, NP as PCP - General (Internal Medicine) End, Cristal Deer, MD as PCP - Cardiology (Cardiology) Lonell Face, MD as Consulting Physician (Neurology) Galen Manila, MD as Referring Physician (Ophthalmology) Sandi Mealy, MD (Inactive) as Consulting Physician (Dermatology) Salena Saner, MD as Consulting Physician (Pulmonary Disease)  Chief Complaint  Patient presents with   Follow-up    No SOB. Occasional wheezing. Cough with clear sputum.    HPI Patient is a 77 year old remote former smoker (30 PY) with a history as noted below who presents for follow-up on the issue of stage II COPD, postinflammatory pulmonary fibrosis and squamous cell carcinoma of the lung.  Patient follows from her visit of 07 May 2022.  This is a scheduled visit.  At her prior visit she was noted to have oxygen desaturations with ambulation and was recommended to have supplemental oxygen.  She does not endorse any wheezing, she has had a infrequent, mild, dry cough.  She does endorse baseline shortness of breath with exertion with no worsening.  She has not had any chest pain.  No orthopnea or paroxysmal nocturnal dyspnea.  No lower extremity edema nor calf tenderness. She does not endorse any fevers, chills or sweats. She does not endorse any other symptomatology.  The patient was prescribed oxygen during her last visit.  She is compliant with nocturnal oxygen and notes benefit of the therapy.  She is not using oxygen during exercise due to not being provided with the portable source that she could carry.  She did not tell us about this issue.  She does not endorse any other symptomatology today.  She is doing well with her Bevespi and as needed albuterol.  Today she inquired about if she could tolerate a colonoscopy.  I think she would be able to tolerate this.  She  apparently had a positive Cologuard but had deferred follow-up with GI.  I told her that this needs to be followed up.  She requests referral to Dr. Charna Elizabeth whom she has seen previously for colonoscopies.    DATA 06/2010 PFTs: FEV1 1.20 (56%), FVC 1.77 L or 63% predicted, FEV1/FVC 68%, significant airtrapping (RV 207%), DLCO 46% but nearly corrects by alveolar volume. 08/30/2021 PFTs: FEV1 0.95 L or 85% predicted, FVC 1.41 L or 71% predicted, FEV1/FVC 67%, normal lung volumes, no significant response.  Moderate diffusion capacity impairment which corrects to alveolar volume. 10/30/2021 CT chest high resolution: 1.5 cm spiculated solid left upper lobe pulmonary nodule suspicious for bronchogenic carcinoma.  No thoracic adenopathy.  Moderate patchy air trapping in both lungs indicative of small airways disease.  Scattered thin parenchymal bands mild patchy subpleural reticulation without bronchiectasis or honeycombing.  Mostly compatible with postinfectious/postinflammatory scarring.  Bilateral pleural thickening without pleural effusions. 11/21/2021 PET/CT: Hypermetabolic left upper lobe pulmonary nodule.  Mildly metabolic pericarinal lymph node, favored to be reactive.  No hypermetabolic left hilar lymph nodes.  No evidence of hypermetabolic distant metastatic disease. 01/12/2022 [facility] protocol CT: Slightly increased left upper lobe pulmonary nodule previously noted to be hypermetabolic.  Stable mild subcarinal lymphadenopathy, nonspecific.  Stable left adrenal adenoma. 01/17/2022 robotic bronchoscopy + EBUS: Left upper lobe nodule positive for squamous cell carcinoma.  TBNA negative for malignancy on subcarinal nodes with sufficient lymph node material consistent with sampling of lymph node. 05/07/2022: Ambulatory oximetry: Patient ambulated 250 feet and desaturated  to 82%.  Placed on 2 L/min continue to desaturate to 87%.  Required 4 L/min to maintain 90 to 91% O2 sats.  Review of Systems A 10  point review of systems was performed and it is as noted above otherwise negative.   Patient Active Problem List   Diagnosis Date Noted   Allergic reaction to contrast dye 07/17/2022   Intermittent claudication (HCC) 07/04/2022   Lipodermatosclerosis of both lower extremities 02/22/2022   Solitary pulmonary nodule 11/27/2021   Chronic right shoulder pain 08/04/2021   Lower extremity pain 08/04/2021   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   Morbid obesity (HCC) 05/01/2021   Foot callus 09/01/2020   Personal history of colonic polyps 11/01/2019   Bilateral carpal tunnel syndrome 08/16/2019   Chronic heart failure with preserved ejection fraction (HFpEF) (HCC) 11/12/2018   DOE (dyspnea on exertion) 11/12/2018   Osteoporosis 12/16/2017   Low vitamin B12 level 01/13/2017   Essential hypertension 01/08/2017   Closed fracture of metatarsal bone 09/28/2016   Prediabetes 10/17/2015   Pain of right lower extremity 05/20/2015   Medicare annual wellness visit, subsequent 10/12/2014   Occipital neuralgia 10/12/2014   Vitamin D deficiency 10/12/2014   Erythema of lower extremity 09/08/2013   COPD GOLD 2  06/05/2010   Hypothyroidism 09/29/2009   Situational anxiety 10/01/2008   COPD 10/01/2008   GERD 10/01/2008   Osteoarthritis 10/01/2008    Social History   Tobacco Use   Smoking status: Former    Current packs/day: 0.00    Average packs/day: 1.5 packs/day for 20.0 years (30.0 ttl pk-yrs)    Types: Cigarettes    Start date: 02/11/1989    Quit date: 02/11/2009    Years since quitting: 13.5   Smokeless tobacco: Never  Substance Use Topics   Alcohol use: No    Alcohol/week: 0.0 standard drinks of alcohol    Allergies  Allergen Reactions   Epinephrine Anaphylaxis and Shortness Of Breath   Food Rash    MANGO = cause rashes around the mouth LIPS GO NUMB   [employer] Rash    THIS IS MANGO LIPS GO NUMB   Other Rash    MANGO = cause rashes around the  mouth MANGO = cause rashes around the mouth LIPS GO NUMB   Prednisone Anaphylaxis, Hives, Shortness Of Breath, Itching and Swelling    The face swells HAS TAKEN CORTISONE SHOTS WITHOUT ISSUE BEFORE   Ivp Dye [Iodinated Contrast Media] Hives   Milk-Related Compounds Diarrhea    DAIRY PRODUCTS ARE A PROBLEM   Pneumococcal 13-Val Conj Vacc Itching and Other (See Comments)    Arm temp elevated at site of injection, red, itching   Prevnar 13 [Pneumococcal 13-Val Conj Vacc] Other (See Comments)    Arm temp elevated at site of injection, red, itching    Current Meds  Medication Sig   amLODipine (NORVASC) 5 MG tablet TAKE 1 TABLET BY MOUTH EVERY DAY FOR BLOOD PRESSURE   Calcium Carb-Cholecalciferol (CALCIUM + VITAMIN D3 PO) Take 1,200 mg by mouth daily.   famotidine (PEPCID) 20 MG tablet One after supper   Glycopyrrolate-Formoterol (BEVESPI AEROSPHERE) 9-4.8 MCG/ACT AERO Inhale 2 puffs into the lungs 2 (two) times daily.   levothyroxine (SYNTHROID) 75 MCG tablet TAKE 1 EVERY AM ON AN EMPTY STOMACH WITH WATER ONLY. NO FOOD OR OTHER MEDICATIONS FOR 30 MINUTES.   losartan (COZAAR) 100 MG tablet TAKE 1 TABLET BY MOUTH EVERY DAY   Multiple Vitamins-Minerals (PRESERVISION AREDS 2 PO) Take 1 tablet by  mouth in the morning and at bedtime.   pantoprazole (PROTONIX) 40 MG tablet Take 1 tablet (40 mg total) by mouth daily. for heartburn.   pentoxifylline (TRENTAL) 400 MG CR tablet TAKE 1 TABLET BY MOUTH 3 TIMES DAILY WITH MEALS.   tacrolimus (PROTOPIC) 0.1 % ointment Apply to aa's lower legs BID PRN.    Immunization History  Administered Date(s) Administered   Fluad Quad(high Dose 65+) 09/02/2020, 10/12/2021   Influenza Whole 10/01/2008   Influenza,inj,Quad PF,6+ Mos 10/12/2014, 10/13/2015, 10/17/2016, 10/17/2017, 10/24/2018, 10/27/2019   PFIZER(Purple Top)SARS-COV-2 Vaccination 03/11/2019, 04/01/2019   Pneumococcal Conjugate-13 10/12/2014   Td 10/01/2008       Objective:    BP 128/82 (BP  Location: Left Arm, Cuff Size: Normal)   Pulse 92   Temp 98.2 F (36.8 C)   Ht 4\' 9"  (1.448 m)   Wt 185 lb 9.6 oz (84.2 kg)   SpO2 92%   BMI 40.16 kg/m   SpO2: 92 % O2 Device: None (Room air)  GENERAL: Obese woman, no acute distress.  Fully ambulatory, no conversational dyspnea. HEAD: Normocephalic, atraumatic.  EYES: Pupils equal, round, reactive to light.  No scleral icterus.  MOUTH: Dentition intact, oral mucosa moist. NECK: Supple. No thyromegaly. Trachea midline. No JVD.  No adenopathy. PULMONARY: Good air entry bilaterally.  Coarse, otherwise, no adventitious sounds. CARDIOVASCULAR: S1 and S2. Regular rate and rhythm.  Grade 1/6 systolic ejection murmur LSB, no gallops or rubs noted. ABDOMEN: Obese, otherwise benign. MUSCULOSKELETAL: No joint deformity, no clubbing, no edema.  NEUROLOGIC: No overt focal deficit, no gait disturbance, speech is fluent. SKIN: Intact,warm,dry. PSYCH: Mood and behavior normal.   Representative image from CT performed 12 July 2022 showing marked decrease in the size of the left upper lobe nodule post SBRT.  Scan also showed no evidence of metastatic disease:     Ambulatory oximetry was performed today: At rest on room air oxygen saturation was 90%, the patient ambulated at a moderate pace, completed 2 laps, O2 nadir 86%, moderate shortness of breath.  She had oxygen applied and titrated to 3 L/min to maintain oxygen saturations at 90% or better during ambulation.  Resting heart rate was 82 bpm at maximum for this exercise 120 bpm on room air and maximum heart rate with oxygen applied was 91 bpm.  Assessment & Plan:     ICD-10-CM   1. COPD GOLD 2   J44.9 Overnight Pulse Oximetry Study    AMB REFERRAL FOR DME   Continue Bevespi twice daily Continue as needed levoalbuterol    2. Postinflammatory pulmonary fibrosis (HCC)  J84.10 Overnight Pulse Oximetry Study    AMB REFERRAL FOR DME   This issue adds complexity to her management Adds to her  issues with hypoxic respiratory failure    3. Chronic respiratory failure with hypoxia (HCC)  J96.11 Overnight Pulse Oximetry Study    AMB REFERRAL FOR DME   Needs supplemental oxygen Able to use 3 L/min with activity Recheck nocturnal oxygen requirements    4. 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 Obesity, Class III, BMI 40-49.9 (morbid obesity) (HCC)  E66.01    This issue adds complexity to her management She would benefit from weight loss    6. Positive colorectal cancer screening using Cologuard test  R19.5 Ambulatory referral to Gastroenterology   Patient was  noted to have positive Cologuard Referred to Dr. Charna Elizabeth per her request      Orders Placed This Encounter  Procedures   Ambulatory referral to Gastroenterology    Referral Priority:   Routine    Referral Type:   Consultation    Referral Reason:   Specialty Services Required    Referred to Provider:   Charna Elizabeth, MD    Requested Specialty:   Gastroenterology    Number of Visits Requested:   1   AMB REFERRAL FOR DME    Referral Priority:   Routine    Referral Type:   Durable Medical Equipment Purchase    Number of Visits Requested:   1   Overnight Pulse Oximetry Study    On 3L of Oxygen DME: [employer]    Standing Status:   Future    Standing Expiration Date:   08/15/2023   Meds ordered this encounter  Medications   levalbuterol (XOPENEX HFA) 45 MCG/ACT inhaler    Sig: Inhale 2 puffs into the lungs every 4 (four) hours as needed for wheezing or shortness of breath.    Dispense:  1 each    Refill:  12    Patient cannot tolerate regular albuterol due to palpitations.   Will see the patient in follow-up in 4 months time she is to call sooner should any new problems arise.  Gailen Shelter, MD Advanced Bronchoscopy [facility]    *This  note was dictated using voice recognition software/Dragon.  Despite best efforts to proofread, errors can occur which can change the meaning. Any transcriptional errors that result from this process are unintentional and may not be fully corrected at the time of dictation.

## 2022-08-15 NOTE — Patient Instructions (Addendum)
I have sent a referral to Dr. Charna Elizabeth for your colonoscopy.  We are going to send a request to [employer] to give you thanks with a cart to help you take oxygen with you.  Your current need for oxygen was down to 3 L/min.  You can decrease this in your overnight oxygen supply, we are going to test your oxygen level on the 3 L to make sure that that is enough to keep you with good oxygen levels.  I have sent a prescription for an alternative inhaler to the albuterol to use for rescue.  Let us know if you have any difficulties getting the inhaler.  We will see you in follow-up in 4 months time call sooner should any new problems arise.

## 2022-08-18 ENCOUNTER — Other Ambulatory Visit: Payer: Self-pay | Admitting: Internal Medicine

## 2022-08-23 ENCOUNTER — Other Ambulatory Visit: Payer: Self-pay | Admitting: Internal Medicine

## 2022-08-24 ENCOUNTER — Telehealth: Payer: Self-pay

## 2022-08-24 NOTE — Telephone Encounter (Signed)
ONO reviewed by Dr. Jayme Cloud- Low SpO2 87%. Lindell Spar stated the patient was on 3 LPM during the test. Increase O2 to 3.5 LPM during sleep.   ATC the patient. LVM for patient to return my call.

## 2022-08-24 NOTE — Telephone Encounter (Signed)
Pt returning missed call. 

## 2022-08-24 NOTE — Telephone Encounter (Signed)
I have notified the patient. Nothing further needed. 

## 2022-08-28 ENCOUNTER — Encounter: Payer: Self-pay | Admitting: Dermatology

## 2022-08-29 ENCOUNTER — Ambulatory Visit
Admission: RE | Admit: 2022-08-29 | Discharge: 2022-08-29 | Disposition: A | Discharge status: Home / Self Care | Payer: Medicare Other | Source: Ambulatory Visit | Attending: Primary Care | Admitting: Primary Care

## 2022-08-29 DIAGNOSIS — E2839 Other primary ovarian failure: Secondary | ICD-10-CM | POA: Diagnosis present

## 2022-08-29 DIAGNOSIS — Z1231 Encounter for screening mammogram for malignant neoplasm of breast: Secondary | ICD-10-CM | POA: Diagnosis present

## 2022-08-30 ENCOUNTER — Other Ambulatory Visit: Payer: Self-pay | Admitting: Primary Care

## 2022-08-30 ENCOUNTER — Telehealth: Payer: Self-pay | Admitting: Gastroenterology

## 2022-08-30 ENCOUNTER — Encounter: Payer: Self-pay | Admitting: Pulmonary Disease

## 2022-08-30 DIAGNOSIS — R928 Other abnormal and inconclusive findings on diagnostic imaging of breast: Secondary | ICD-10-CM

## 2022-08-30 NOTE — Telephone Encounter (Signed)
Spoken to patient and discuss the reason why she needed the new patient appointment

## 2022-08-30 NOTE — Telephone Encounter (Signed)
Patient called left on the vm to speak to the [place]. I called back to let her know we received her vm.

## 2022-09-01 DIAGNOSIS — M810 Age-related osteoporosis without current pathological fracture: Secondary | ICD-10-CM

## 2022-09-01 NOTE — Telephone Encounter (Signed)
See other MyChart response.

## 2022-09-02 MED ORDER — ALENDRONATE SODIUM 70 MG PO TABS
ORAL_TABLET | ORAL | 3 refills | Status: DC
Start: 2022-09-02 — End: 2022-09-08

## 2022-09-03 ENCOUNTER — Encounter: Payer: Self-pay | Admitting: Pulmonary Disease

## 2022-09-04 ENCOUNTER — Ambulatory Visit: Payer: Medicare Other | Admitting: Primary Care

## 2022-09-04 ENCOUNTER — Ambulatory Visit: Payer: Medicare Other | Attending: Primary Care

## 2022-09-04 DIAGNOSIS — I739 Peripheral vascular disease, unspecified: Secondary | ICD-10-CM | POA: Diagnosis present

## 2022-09-04 NOTE — Telephone Encounter (Signed)
Noted  

## 2022-09-05 DIAGNOSIS — I739 Peripheral vascular disease, unspecified: Secondary | ICD-10-CM

## 2022-09-05 LAB — VAS US LOWER EXT ART SEG MULTI (SEGMENTALS & LE RAYNAUDS)
Left ABI: 0.93
Right ABI: 0.98

## 2022-09-05 MED ORDER — ATORVASTATIN CALCIUM 10 MG PO TABS: 10.0000 mg | ORAL_TABLET | Freq: Every day | ORAL | 1 refills | Status: DC

## 2022-09-06 ENCOUNTER — Emergency Department: Payer: Medicare Other

## 2022-09-06 ENCOUNTER — Inpatient Hospital Stay
Admission: EM | Admit: 2022-09-06 | Discharge: 2022-09-08 | DRG: 291 | Disposition: A | Discharge status: Home / Self Care | Payer: Medicare Other | Attending: Internal Medicine | Admitting: Internal Medicine

## 2022-09-06 ENCOUNTER — Other Ambulatory Visit: Payer: Self-pay

## 2022-09-06 ENCOUNTER — Encounter: Payer: Self-pay | Admitting: Emergency Medicine

## 2022-09-06 ENCOUNTER — Telehealth: Payer: Self-pay | Admitting: Pharmacy Technician

## 2022-09-06 DIAGNOSIS — Z8042 Family history of malignant neoplasm of prostate: Secondary | ICD-10-CM

## 2022-09-06 DIAGNOSIS — Z8249 Family history of ischemic heart disease and other diseases of the circulatory system: Secondary | ICD-10-CM

## 2022-09-06 DIAGNOSIS — E876 Hypokalemia: Secondary | ICD-10-CM | POA: Diagnosis present

## 2022-09-06 DIAGNOSIS — I10 Essential (primary) hypertension: Secondary | ICD-10-CM | POA: Diagnosis present

## 2022-09-06 DIAGNOSIS — Z91011 Allergy to milk products: Secondary | ICD-10-CM

## 2022-09-06 DIAGNOSIS — Z887 Allergy status to serum and vaccine status: Secondary | ICD-10-CM

## 2022-09-06 DIAGNOSIS — Z90710 Acquired absence of both cervix and uterus: Secondary | ICD-10-CM

## 2022-09-06 DIAGNOSIS — Z87891 Personal history of nicotine dependence: Secondary | ICD-10-CM

## 2022-09-06 DIAGNOSIS — I5033 Acute on chronic diastolic (congestive) heart failure: Secondary | ICD-10-CM | POA: Diagnosis present

## 2022-09-06 DIAGNOSIS — I2510 Atherosclerotic heart disease of native coronary artery without angina pectoris: Secondary | ICD-10-CM | POA: Diagnosis present

## 2022-09-06 DIAGNOSIS — Z981 Arthrodesis status: Secondary | ICD-10-CM | POA: Diagnosis not present

## 2022-09-06 DIAGNOSIS — Z9981 Dependence on supplemental oxygen: Secondary | ICD-10-CM

## 2022-09-06 DIAGNOSIS — Z803 Family history of malignant neoplasm of breast: Secondary | ICD-10-CM | POA: Diagnosis not present

## 2022-09-06 DIAGNOSIS — Z91018 Allergy to other foods: Secondary | ICD-10-CM

## 2022-09-06 DIAGNOSIS — J9621 Acute and chronic respiratory failure with hypoxia: Secondary | ICD-10-CM | POA: Insufficient documentation

## 2022-09-06 DIAGNOSIS — Z833 Family history of diabetes mellitus: Secondary | ICD-10-CM | POA: Diagnosis not present

## 2022-09-06 DIAGNOSIS — K219 Gastro-esophageal reflux disease without esophagitis: Secondary | ICD-10-CM | POA: Diagnosis present

## 2022-09-06 DIAGNOSIS — Z6839 Body mass index (BMI) 39.0-39.9, adult: Secondary | ICD-10-CM | POA: Diagnosis not present

## 2022-09-06 DIAGNOSIS — I110 Hypertensive heart disease with heart failure: Secondary | ICD-10-CM | POA: Diagnosis present

## 2022-09-06 DIAGNOSIS — R600 Localized edema: Secondary | ICD-10-CM | POA: Diagnosis present

## 2022-09-06 DIAGNOSIS — Z831 Family history of other infectious and parasitic diseases: Secondary | ICD-10-CM | POA: Diagnosis not present

## 2022-09-06 DIAGNOSIS — E039 Hypothyroidism, unspecified: Secondary | ICD-10-CM | POA: Diagnosis present

## 2022-09-06 DIAGNOSIS — E66812 Obesity, class 2: Secondary | ICD-10-CM | POA: Diagnosis present

## 2022-09-06 DIAGNOSIS — J449 Chronic obstructive pulmonary disease, unspecified: Secondary | ICD-10-CM | POA: Diagnosis present

## 2022-09-06 DIAGNOSIS — Z7983 Long term (current) use of bisphosphonates: Secondary | ICD-10-CM | POA: Diagnosis not present

## 2022-09-06 DIAGNOSIS — Z79890 Hormone replacement therapy (postmenopausal): Secondary | ICD-10-CM | POA: Diagnosis not present

## 2022-09-06 DIAGNOSIS — J4489 Other specified chronic obstructive pulmonary disease: Secondary | ICD-10-CM | POA: Diagnosis present

## 2022-09-06 DIAGNOSIS — M353 Polymyalgia rheumatica: Secondary | ICD-10-CM | POA: Diagnosis present

## 2022-09-06 DIAGNOSIS — I2721 Secondary pulmonary arterial hypertension: Secondary | ICD-10-CM | POA: Diagnosis present

## 2022-09-06 DIAGNOSIS — Z801 Family history of malignant neoplasm of trachea, bronchus and lung: Secondary | ICD-10-CM

## 2022-09-06 DIAGNOSIS — Z9049 Acquired absence of other specified parts of digestive tract: Secondary | ICD-10-CM

## 2022-09-06 DIAGNOSIS — M8580 Other specified disorders of bone density and structure, unspecified site: Secondary | ICD-10-CM | POA: Diagnosis present

## 2022-09-06 DIAGNOSIS — Z5986 Financial insecurity: Secondary | ICD-10-CM

## 2022-09-06 DIAGNOSIS — Z79899 Other long term (current) drug therapy: Secondary | ICD-10-CM

## 2022-09-06 DIAGNOSIS — Z888 Allergy status to other drugs, medicaments and biological substances status: Secondary | ICD-10-CM

## 2022-09-06 HISTORY — DX: Acute and chronic respiratory failure with hypoxia: J96.21

## 2022-09-06 LAB — URINALYSIS, ROUTINE W REFLEX MICROSCOPIC
Bilirubin Urine: NEGATIVE
Glucose, UA: NEGATIVE mg/dL
Hgb urine dipstick: NEGATIVE
Ketones, ur: 5 mg/dL — AB
Leukocytes,Ua: NEGATIVE
Nitrite: NEGATIVE
Protein, ur: NEGATIVE mg/dL
Specific Gravity, Urine: 1.003 — ABNORMAL LOW (ref 1.005–1.030)
pH: 5 (ref 5.0–8.0)

## 2022-09-06 LAB — CBC
HCT: 38.5 % (ref 36.0–46.0)
Hemoglobin: 11.8 g/dL — ABNORMAL LOW (ref 12.0–15.0)
MCH: 28.4 pg (ref 26.0–34.0)
MCHC: 30.6 g/dL (ref 30.0–36.0)
MCV: 92.8 fL (ref 80.0–100.0)
Platelets: 297 10*3/uL (ref 150–400)
RBC: 4.15 MIL/uL (ref 3.87–5.11)
RDW: 15 % (ref 11.5–15.5)
WBC: 8.9 10*3/uL (ref 4.0–10.5)
nRBC: 0 % (ref 0.0–0.2)

## 2022-09-06 LAB — BASIC METABOLIC PANEL
Anion gap: 11 (ref 5–15)
BUN: 9 mg/dL (ref 8–23)
CO2: 29 mmol/L (ref 22–32)
Calcium: 8.9 mg/dL (ref 8.9–10.3)
Chloride: 102 mmol/L (ref 98–111)
Creatinine, Ser: 0.72 mg/dL (ref 0.44–1.00)
GFR, Estimated: >60 mL/min (ref >60)
Glucose, Bld: 98 mg/dL (ref 70–99)
Potassium: 3.8 mmol/L (ref 3.5–5.1)
Sodium: 142 mmol/L (ref 135–145)

## 2022-09-06 LAB — BRAIN NATRIURETIC PEPTIDE: B Natriuretic Peptide: 237 pg/mL — ABNORMAL HIGH (ref 0.0–100.0)

## 2022-09-06 MED ORDER — SODIUM CHLORIDE 0.9 % IV SOLN: 250.0000 mL | INTRAVENOUS | Status: DC | PRN

## 2022-09-06 MED ORDER — LEVALBUTEROL HCL 0.63 MG/3ML IN NEBU: 0.6300 mg | INHALATION_SOLUTION | RESPIRATORY_TRACT | Status: DC | PRN

## 2022-09-06 MED ORDER — FUROSEMIDE 10 MG/ML IJ SOLN
60.0000 mg | Freq: Once | INTRAMUSCULAR | Status: AC
  Administered 2022-09-06: 60 mg via INTRAVENOUS
  Filled 2022-09-06: qty 8

## 2022-09-06 MED ORDER — SODIUM CHLORIDE 0.9% FLUSH: 3.0000 mL | INTRAVENOUS | Status: DC | PRN

## 2022-09-06 MED ORDER — LOSARTAN POTASSIUM 50 MG PO TABS
100.0000 mg | ORAL_TABLET | Freq: Every day | ORAL | Status: DC
  Administered 2022-09-06 – 2022-09-08 (×3): 100 mg via ORAL
  Filled 2022-09-06 (×3): qty 2

## 2022-09-06 MED ORDER — ENOXAPARIN SODIUM 40 MG/0.4ML IJ SOSY: 40.0000 mg | PREFILLED_SYRINGE | INTRAMUSCULAR | Status: DC

## 2022-09-06 MED ORDER — ONDANSETRON HCL 4 MG/2ML IJ SOLN: 4.0000 mg | Freq: Four times a day (QID) | INTRAMUSCULAR | Status: DC | PRN

## 2022-09-06 MED ORDER — LEVOTHYROXINE SODIUM 50 MCG PO TABS
75.0000 ug | ORAL_TABLET | Freq: Every day | ORAL | Status: DC
  Administered 2022-09-07 – 2022-09-08 (×2): 75 ug via ORAL
  Filled 2022-09-06 (×2): qty 1

## 2022-09-06 MED ORDER — FUROSEMIDE 10 MG/ML IJ SOLN
40.0000 mg | Freq: Two times a day (BID) | INTRAMUSCULAR | Status: DC
  Administered 2022-09-07 – 2022-09-08 (×3): 40 mg via INTRAVENOUS
  Filled 2022-09-06 (×3): qty 4

## 2022-09-06 MED ORDER — ATORVASTATIN CALCIUM 10 MG PO TABS
10.0000 mg | ORAL_TABLET | Freq: Every evening | ORAL | Status: DC
  Administered 2022-09-06 – 2022-09-07 (×2): 10 mg via ORAL
  Filled 2022-09-06 (×2): qty 1

## 2022-09-06 MED ORDER — PENTOXIFYLLINE ER 400 MG PO TBCR
400.0000 mg | EXTENDED_RELEASE_TABLET | Freq: Three times a day (TID) | ORAL | Status: DC
  Administered 2022-09-06 – 2022-09-07 (×3): 400 mg via ORAL
  Filled 2022-09-06 (×7): qty 1

## 2022-09-06 MED ORDER — ACETAMINOPHEN 325 MG PO TABS: 650.0000 mg | ORAL_TABLET | ORAL | Status: DC | PRN

## 2022-09-06 MED ORDER — PANTOPRAZOLE SODIUM 40 MG PO TBEC
40.0000 mg | DELAYED_RELEASE_TABLET | Freq: Every day | ORAL | Status: DC
  Administered 2022-09-06 – 2022-09-08 (×3): 40 mg via ORAL
  Filled 2022-09-06 (×3): qty 1

## 2022-09-06 MED ORDER — ENOXAPARIN SODIUM 60 MG/0.6ML IJ SOSY
43.0000 mg | PREFILLED_SYRINGE | INTRAMUSCULAR | Status: DC
  Administered 2022-09-06 – 2022-09-07 (×2): 42.5 mg via SUBCUTANEOUS
  Filled 2022-09-06 (×2): qty 0.6

## 2022-09-06 MED ORDER — SODIUM CHLORIDE 0.9% FLUSH
3.0000 mL | Freq: Two times a day (BID) | INTRAVENOUS | Status: DC
  Administered 2022-09-06 – 2022-09-08 (×4): 3 mL via INTRAVENOUS

## 2022-09-06 NOTE — H&P (Signed)
History and Physical    Patient: Teresa Jensen NFA:[id number] DOB: 1945-01-11 DOA: 09/06/2022 DOS: the patient was seen and examined on 09/06/2022 PCP: Doreene Nest, NP  Patient coming from: Home  Chief Complaint:  Chief Complaint  Patient presents with   Leg Swelling   HPI: Teresa Jensen is a 77 y.o. 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  Chest x-ray showed vascular congestion, patient has mild elevation of BNP.  Patient also had worsening hypoxemia on 3 L oxygen, was placed on 6 L oxygen.  He is given IV Lasix in the ED, admitted to the hospital for treatment of exacerbation congestive heart failure. Patient has short of breath with exertion at baseline, but for the last 3 or 4 days, she can no longer walk the distance she did before.  She also had significant orthopnea, leg edema.  She had a 4 pounds weight gain in 48 hours. Upon arriving the hospital, she had blood pressure 161/56, heart rate 95-1 06, oxygen saturation was 72-87 on 3 L, increased to 90% on 6 L.  BNP 237.  Bilateral leg duplex ultrasound negative for DVT.   Review of Systems: As mentioned in the history of present illness. All other systems reviewed and are negative. Past Medical History:  Diagnosis Date   (HFpEF) heart failure with preserved ejection fraction (HCC) 06/28/2011   a.) TTE 06/28/2011: EF 55-60%, mild AR, G1DD; b.) TTE 01/16/2013: EF 55-60%, mild MR; c.) TTE 02/16/2018: EF 60-65%, G1DD; d.) MPI 11/26/2018: EF 56%   Abnormal electrocardiogram 11/12/2018   Acute kidney failure (HCC) 01/12/2017   Adenoma of left adrenal gland    Anemia    Anxiety    Aortic atherosclerosis (HCC)    Bilateral carpal tunnel syndrome    CAD (coronary  artery disease)    a.) HR CT chest 10/30/2021: 2 vessel CAD (LAD/RCA)   CAP (community acquired pneumonia)    COPD (chronic obstructive pulmonary disease) (HCC)    COPD exacerbation (HCC) 01/21/2018   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   Diverticulosis    Dyspnea    DUE TO HEAT WITH COPD   Epistaxis 01/09/2017   GERD (gastroesophageal reflux disease)    Herpes zoster 07/08/2015   Hypertension    Hypothyroidism    Iatrogenic hypotension 01/12/2017   Iliac artery stenosis, bilateral (HCC)    Morbid obesity with BMI of 40.0-44.9, adult (HCC)    New onset of headaches    Osteopenia    PMR (polymyalgia rheumatica) (HCC)    Pneumonia 02/13/2018   PONV (postoperative nausea and vomiting)    Pulmonary nodule, left    a.) CT chest 10/30/2021: 1.5 cm medial LUL nodule; b.) PET CT 11/21/2021: hypermetabolic LUL nodule (SUX max 9.3)   S/P carpal tunnel release 08/04/2020   Shingles    Stasis dermatitis    Strain of chest wall 01/20/2011   Tobacco abuse    Vitamin D deficiency    Past Surgical History:  Procedure Laterality Date   ANTERIOR CERVICAL DECOMP/DISCECTOMY FUSION  06/22/2004   C4-C6   APPENDECTOMY     CARPAL TUNNEL RELEASE Left 08/31/2019   Procedure: CARPAL TUNNEL RELEASE;  Surgeon: Donato Heinz, MD;  Location: [facility] OR[facility];  Service: Orthopedics;  Laterality: Left;   CARPAL TUNNEL RELEASE  Right 06/22/2020   Procedure: CARPAL TUNNEL RELEASE;  Surgeon: Donato Heinz, MD;  Location: [facility];  Service: Orthopedics;  Laterality: Right;   CHOLECYSTECTOMY     COLONOSCOPY     ENDOBRONCHIAL ULTRASOUND Left 01/17/2022   Procedure: ENDOBRONCHIAL ULTRASOUND;  Surgeon: Salena Saner, MD;  Location: [facility] OR[facility];  Service: Pulmonary;  Laterality: Left;   ESOPHAGOGASTRODUODENOSCOPY     HEMORRHOID SURGERY     TOTAL ABDOMINAL HYSTERECTOMY W/ BILATERAL SALPINGOOPHORECTOMY     TUBAL LIGATION     Social History:  reports that she quit smoking about 13 years ago. Her smoking use  included cigarettes. She started smoking about 33 years ago. She has a 30 pack-year smoking history. She has never used smokeless tobacco. She reports that she does not drink alcohol and does not use drugs.  Allergies  Allergen Reactions   Epinephrine Anaphylaxis and Shortness Of Breath   Food Rash    MANGO = cause rashes around the mouth LIPS GO NUMB   [employer] Rash    THIS IS MANGO LIPS GO NUMB   Other Rash    MANGO = cause rashes around the mouth MANGO = cause rashes around the mouth LIPS GO NUMB   Prednisone Anaphylaxis, Hives, Shortness Of Breath, Itching and Swelling    The face swells HAS TAKEN CORTISONE SHOTS WITHOUT ISSUE BEFORE   Ivp Dye [Iodinated Contrast Media] Hives   Milk-Related Compounds Diarrhea    DAIRY PRODUCTS ARE A PROBLEM   Pneumococcal 13-Val Conj Vacc Itching and Other (See Comments)    Arm temp elevated at site of injection, red, itching   Prevnar 13 [Pneumococcal 13-Val Conj Vacc] Other (See Comments)    Arm temp elevated at site of injection, red, itching    Family History  Problem Relation Age of Onset   Heart attack Mother 72   Cancer Father        lung/chest wall    Prostate cancer Father    Diabetes Brother        siblings   Hypertension Sister        x 2   Heart disease Brother    Breast cancer Paternal Grandmother    Heart disease Sister    Diabetes Sister    Tuberculosis Sister    Cirrhosis Brother     Prior to Admission medications   Medication Sig Start Date End Date Taking? Authorizing Provider  alendronate (FOSAMAX) 70 MG tablet Take 1 tablet by mouth once weekly with a full glass of water on an empty stomach with water only. Avoid laying flat for 2 hours. 09/02/22   Doreene Nest, NP  amLODipine (NORVASC) 5 MG tablet TAKE 1 TABLET BY MOUTH EVERY DAY FOR BLOOD PRESSURE 02/24/22   Doreene Nest, NP  atorvastatin (LIPITOR) 10 MG tablet Take 1 tablet (10 mg total) by mouth daily. for cholesterol. 09/05/22   Doreene Nest, NP  Calcium Carb-Cholecalciferol (CALCIUM + VITAMIN D3 PO) Take 1,200 mg by mouth daily.    [provider]  famotidine (PEPCID) 20 MG tablet TAKE 1 TABLET BY MOUTH AFTER SUPPER 08/27/22   Nyoka Cowden, MD  Glycopyrrolate-Formoterol (BEVESPI AEROSPHERE) 9-4.8 MCG/ACT AERO Inhale 2 puffs into the lungs 2 (two) times daily. 10/19/21   Nyoka Cowden, MD  levalbuterol [facility] HFA) 45 MCG/ACT inhaler Inhale 2 puffs into the lungs every 4 (four) hours as needed for wheezing or shortness of breath. 08/15/22   Salena Saner, MD  levothyroxine (SYNTHROID)  75 MCG tablet TAKE 1 EVERY AM ON AN EMPTY STOMACH WITH WATER ONLY. NO FOOD OR OTHER MEDICATIONS FOR 30 MINUTES. 05/20/22   Doreene Nest, NP  losartan (COZAAR) 100 MG tablet Take 1 tablet (100 mg total) by mouth daily. PLEASE CALL [phone] TO SCHEDULE A YEARLY APPOINTMENT. THANK YOU. 08/20/22   End, Cristal Deer, MD  Multiple Vitamins-Minerals (PRESERVISION AREDS 2 PO) Take 1 tablet by mouth in the morning and at bedtime.    [provider]  pantoprazole (PROTONIX) 40 MG tablet Take 1 tablet (40 mg total) by mouth daily. for heartburn. 08/08/22   Doreene Nest, NP  pentoxifylline (TRENTAL) 400 MG CR tablet TAKE 1 TABLET BY MOUTH 3 TIMES DAILY WITH MEALS. 06/18/22   Willeen Niece, MD  tacrolimus (PROTOPIC) 0.1 % ointment Apply to aa's lower legs BID PRN. 05/17/22   Sandi Mealy, MD    Physical Exam: Vitals:   09/06/22 1036 09/06/22 1355 09/06/22 1625 09/06/22 1631  BP:  (!) 156/62 133/73 (!) 145/124  Pulse:  (!) 102 (!) 118 (!) 106  Resp:  20 (!) 34 (!) 24  Temp:      TempSrc:      SpO2:  94% (!) 68% 90%  Weight: 83.5 kg     Height: 4\' 9"  (1.448 m)      Physical Exam Constitutional:      General: She is not in acute distress.    Appearance: She is obese. She is not toxic-appearing.  HENT:     Head: Normocephalic and atraumatic.     Nose: Nose normal. No congestion.     Mouth/Throat:     Mouth:  Mucous membranes are moist.     Pharynx: Oropharynx is clear. No oropharyngeal exudate.  Eyes:     Extraocular Movements: Extraocular movements intact.     Conjunctiva/sclera: Conjunctivae normal.     Pupils: Pupils are equal, round, and reactive to light.  Cardiovascular:     Rate and Rhythm: Normal rate and regular rhythm.     Heart sounds: No murmur heard.    No gallop.  Pulmonary:     Effort: Pulmonary effort is normal. No respiratory distress.     Breath sounds: Rales present.  Abdominal:     General: Abdomen is flat. Bowel sounds are normal. There is no distension.     Palpations: Abdomen is soft.     Tenderness: There is no abdominal tenderness.  Musculoskeletal:        General: Normal range of motion.     Cervical back: Normal range of motion and neck supple. No rigidity.     Right lower leg: Edema present.     Left lower leg: Edema present.  Lymphadenopathy:     Cervical: No cervical adenopathy.  Skin:    General: Skin is warm and dry.     Coloration: Skin is not jaundiced.  Neurological:     General: No focal deficit present.     Mental Status: She is alert and oriented to person, place, and time.     Cranial Nerves: No cranial nerve deficit.  Psychiatric:        Mood and Affect: Mood normal.        Thought Content: Thought content normal.     Data Reviewed:  Reviewed chest x-ray, duplex ultrasound, all lab results.  Assessment and Plan: Acute on chronic diastolic congestive heart failure. Acute on chronic hypoxemic respiratory failure secondary to congestive heart failure exacerbation. Patient had increased short of breath  at the time of admission, increased oxygen requirement with hypoxemia on baseline 3 L.  This is caused by exacerbation of congestive heart failure. Patient had symptoms of worsening short of breath, weight gain, orthopnea, chest x-ray showed vascular congestion.  Mild elevation of BNP, which would be under estimated due to morbid  obesity. Will obtain echocardiogram to reevaluate ejection fraction, reviewed prior echo performed in 2020, ejection fraction was normal with diastolic dysfunction. Patient has received IV Lasix at the ED, will continue at 40 mg twice a day.  Monitor electrolytes and daily weight.  COPD. No bronchospasm.  Morbid obesity. BMI 39.82 with comorbidity.  Diet exercise advised.  Essential hypertension. Continue some home medicines   Hypothyroidism.  Continue Synthroid.   Advance Care Planning:   Code Status: Full Code discussed with patient and husband, patient is a full code.  Consults: None  Family Communication: Husband updated at the bedside.  Severity of Illness: The appropriate patient status for this patient is INPATIENT. Inpatient status is judged to be reasonable and necessary in order to provide the required intensity of service to ensure the patient's safety. The patient's presenting symptoms, physical exam findings, and initial radiographic and laboratory data in the context of their chronic comorbidities is felt to place them at high risk for further clinical deterioration. Furthermore, it is not anticipated that the patient will be medically stable for discharge from the hospital within 2 midnights of admission.   * I certify that at the point of admission it is my clinical judgment that the patient will require inpatient hospital care spanning beyond 2 midnights from the point of admission due to high intensity of service, high risk for further deterioration and high frequency of surveillance required.*  Author: Marrion Coy, MD 09/06/2022 4:55 PM  For on call review [email or web address].

## 2022-09-06 NOTE — ED Notes (Signed)
EDT answered call bell. Pt assisted to toilet per request with this EDT providing minimal assistance. Pt removed from monitoring devices temporarily. Pt instructed to press call bell to notify ED staff when she is finished voiding.  Call bell pressed. Pt self transferred from toilet to bed with EDT providing standby assist. Pt reconnected to all monitoring devices and call bell still within reach. Both side rails raised. Pt assisted into a position of comfort and pts family members invited back into pts room. Pt denied any further needs at this time.

## 2022-09-06 NOTE — ED Provider Notes (Signed)
[facility] Provider Note    Event Date/Time   First MD Initiated Contact with Patient 09/06/22 1350     (approximate)   History   Leg Swelling   HPI  Teresa Jensen is a 77 year old female with history of CHF with EF of 60 to 65% on echo from 2020, CAD, COPD on home oxygen presenting to the emergency department for evaluation of fatigue and lower extremity edema.  Patient reports that over the past few days she has noticed onset of bilateral lower extremity edema.  Recently saw her primary care doctor and is being worked up for claudication, but says that the swelling is new for her.  Denies known history of CHF, though this is documented in her chart.  Denies chest pain or shortness of breath.      Physical Exam   Triage Vital Signs: ED Triage Vitals  Encounter Vitals Group     BP 09/06/22 1032 (!) 161/56     Systolic BP Percentile --      Diastolic BP Percentile --      Pulse Rate 09/06/22 1032 99     Resp 09/06/22 1032 18     Temp 09/06/22 1032 97.8 F (36.6 C)     Temp Source 09/06/22 1032 Oral     SpO2 09/06/22 1032 93 %     Weight 09/06/22 1034 184 lb (83.5 kg)     Height 09/06/22 1034 4\' 9"  (1.448 m)     Head Circumference --      Peak Flow --      Pain Score 09/06/22 1036 0     Pain Loc --      Pain Education --      Exclude from Growth Chart --     Most recent vital signs: Vitals:   09/06/22 1035 09/06/22 1355  BP: (!) 161/56 (!) 156/62  Pulse: 95 (!) 102  Resp: 18 20  Temp: 97.8 F (36.6 C)   SpO2: 94% 94%     General: Awake, interactive  CV:  Regular rate, good peripheral perfusion Resp:  Lungs coarse bilaterally, respirations not significantly labored, satting in the mid 90s on home oxygen.  Abd:  Soft, nondistended.  Neuro:  Symmetric facial movement, fluid speech MSK:  Bilateral lower extremity edema, pitting with associated tenderness to palpation.  Faint rash over the right calf that patient reports is  longstanding, follows with dermatology for this.   ED Results / Procedures / Treatments   Labs (all labs ordered are listed, but only abnormal results are displayed) Labs Reviewed  CBC - Abnormal; Notable for the following components:      Result Value   Hemoglobin 11.8 (*)    All other components within normal limits  URINALYSIS, ROUTINE W REFLEX MICROSCOPIC - Abnormal; Notable for the following components:   Color, Urine STRAW (*)    APPearance CLEAR (*)    Specific Gravity, Urine 1.003 (*)    Ketones, ur 5 (*)    All other components within normal limits  BRAIN NATRIURETIC PEPTIDE - Abnormal; Notable for the following components:   B Natriuretic Peptide 237.0 (*)    All other components within normal limits  BASIC METABOLIC PANEL  CBG MONITORING, ED     EKG EKG independently reviewed interpreted by myself (ER attending) demonstrates:  EKG demonstrate sinus rhythm at rate of 96, PR 156, QRS 82, QTc 439, no acute ST changes  RADIOLOGY Imaging independently reviewed and interpreted by myself demonstrates:  Chest x-Teresa Jensen on my review with new bilateral opacities concerning for possible pulmonary edema  PROCEDURES:  Critical Care performed: No  Procedures   MEDICATIONS ORDERED IN ED: Medications - No data to display   IMPRESSION / MDM / ASSESSMENT AND PLAN / ED COURSE  I reviewed the triage vital signs and the nursing notes.  Differential diagnosis includes, but is not limited to, medication adverse effect, volume overload in the setting of CHF, pneumonia, DVT  Patient's presentation is most consistent with acute presentation with potential threat to life or bodily function.  77 year old female presenting to the emergency department for evaluation of lower extremity swelling.  Last in triage without severe derangement.  BNP added on and is elevated at 237, but similar to a prior from 4 years ago.  X-Teresa Jensen awaiting radiology read, but does appear to have possible edema,  possible CHF exacerbation.  On reevaluation, patient did have O2 sats around 90% on 4 L, normally wears 3 L, so I do suspect that she may have some acute on chronic hypoxia.DVT ultrasound pending.  Signed out tooncoming physician pending ultrasound, reevaluation, and disposition.      FINAL CLINICAL IMPRESSION(S) / ED DIAGNOSES   Final diagnoses:  Bilateral lower extremity edema     Rx / DC Orders   ED Discharge Orders     None        Note:  This document was prepared using Dragon voice recognition software and may include unintentional dictation errors.   Teresa Post, MD 09/06/22 [phone]

## 2022-09-06 NOTE — Progress Notes (Signed)
[facility])                                            [facility] Quality Pharmacy Team    09/06/2022  Emmelia Melerine Bonello [birth year] [id number]  Returned call to patient in regard to voicemail left on phone.  Spoke to patient, HIPAA verified. Patient informs she received communication that it was time to renew [employer] with [employer].  Informed patient per the communication we received, [employer] was going to begin in October, electronically verifying income and that a letter would be sent in October informing patient if they were approved or if more information is needed. Informed patient to outreach once she has received that letter. Patient was agreeable to this plan and verbalized understanding.  Pattricia Boss, CPhT [place]  Office: [phone] Fax: [phone] Email: [email or web address]

## 2022-09-06 NOTE — ED Notes (Signed)
This NT assisted pt to bathroom and back into the bed safely.

## 2022-09-06 NOTE — ED Notes (Addendum)
Pt ambulated with RN as standby assist to toilet within pts room. Pt remained on 4L [place] while transferring to toilet and back to bed. Pt very out of breath after transfer from toilet back to bed, breathing labored, and pt in tripod position on bed. SpO2 while ambulating from toilet to bed was 68% on 4L [place]. O2 flow rate increased to 6L and pt instructed to angle [place] into her mouth since pt endorsed having a "stuffy nose" and the pt stating "It is so hard to breath through my nose." After approximately 5 minutes of rest, pts SpO2 leveled at roughly 90% on 6L [place] and respiratory rate decreased.

## 2022-09-06 NOTE — Progress Notes (Signed)
PHARMACIST - PHYSICIAN COMMUNICATION  CONCERNING:  Enoxaparin (Lovenox) for DVT Prophylaxis    RECOMMENDATION: Patient was prescribed enoxaprin 40mg  q24 hours for VTE prophylaxis.   Filed Weights   09/06/22 1034 09/06/22 1036  Weight: 83.5 kg (184 lb) 83.5 kg (184 lb)    Body mass index is 39.82 kg/m.  Estimated Creatinine Clearance: 52.6 mL/min (by C-G formula based on SCr of 0.72 mg/dL).   Based on [facility] policy patient is candidate for enoxaparin 0.5mg /kg TBW SQ every 24 hours based on BMI being >30.  DESCRIPTION: Pharmacy has adjusted enoxaparin dose per [facility] policy.  Patient is now receiving enoxaparin 43 mg every 24 hours    Gardner Candle, PharmD, BCPS Clinical Pharmacist 09/06/2022 4:58 PM

## 2022-09-06 NOTE — ED Triage Notes (Signed)
Patient to ED via [facility] from home for bilateral lower leg edema and intermittent dizziness. Hx of COPD and lung cancer- wears 3L [place] at baseline. AOX4- states slight SOB.

## 2022-09-06 NOTE — ED Notes (Signed)
Hospitalist at bedside 

## 2022-09-07 ENCOUNTER — Other Ambulatory Visit: Payer: No Typology Code available for payment source

## 2022-09-07 ENCOUNTER — Other Ambulatory Visit (HOSPITAL_COMMUNITY): Payer: Self-pay

## 2022-09-07 ENCOUNTER — Inpatient Hospital Stay (HOSPITAL_COMMUNITY)
Admit: 2022-09-07 | Discharge: 2022-09-07 | Disposition: A | Discharge status: Home / Self Care | Payer: Medicare Other | Attending: Internal Medicine

## 2022-09-07 DIAGNOSIS — J9621 Acute and chronic respiratory failure with hypoxia: Secondary | ICD-10-CM | POA: Diagnosis not present

## 2022-09-07 DIAGNOSIS — I10 Essential (primary) hypertension: Secondary | ICD-10-CM | POA: Diagnosis not present

## 2022-09-07 DIAGNOSIS — I5033 Acute on chronic diastolic (congestive) heart failure: Secondary | ICD-10-CM

## 2022-09-07 LAB — ECHOCARDIOGRAM COMPLETE
AR max vel: 1.48 cm2
AV Area VTI: 1.68 cm2
AV Area mean vel: 1.58 cm2
AV Mean grad: 10.5 mmHg
AV Peak grad: 21.4 mmHg
Ao pk vel: 2.32 m/s
Area-P 1/2: 2.82 cm2
Calc EF: 55.6 %
Height: 57 in
MV VTI: 2.53 cm2
S' Lateral: 3.1 cm
Single Plane A2C EF: 58.9 %
Single Plane A4C EF: 50.7 %
Weight: 2944 [oz_av]

## 2022-09-07 LAB — BASIC METABOLIC PANEL
Anion gap: 9 (ref 5–15)
BUN: 13 mg/dL (ref 8–23)
CO2: 34 mmol/L — ABNORMAL HIGH (ref 22–32)
Calcium: 8.3 mg/dL — ABNORMAL LOW (ref 8.9–10.3)
Chloride: 99 mmol/L (ref 98–111)
Creatinine, Ser: 0.82 mg/dL (ref 0.44–1.00)
GFR, Estimated: >60 mL/min (ref >60)
Glucose, Bld: 88 mg/dL (ref 70–99)
Potassium: 3.3 mmol/L — ABNORMAL LOW (ref 3.5–5.1)
Sodium: 142 mmol/L (ref 135–145)

## 2022-09-07 LAB — MAGNESIUM: Magnesium: 2 mg/dL (ref 1.7–2.4)

## 2022-09-07 MED ORDER — FLUTICASONE PROPIONATE 50 MCG/ACT NA SUSP
2.0000 | Freq: Two times a day (BID) | NASAL | Status: DC
  Administered 2022-09-07 – 2022-09-08 (×2): 2 via NASAL
  Filled 2022-09-07: qty 16

## 2022-09-07 MED ORDER — POTASSIUM CHLORIDE CRYS ER 20 MEQ PO TBCR
40.0000 meq | EXTENDED_RELEASE_TABLET | ORAL | Status: AC
  Administered 2022-09-07 (×2): 40 meq via ORAL
  Filled 2022-09-07 (×2): qty 2

## 2022-09-07 MED ORDER — SPIRONOLACTONE 25 MG PO TABS
25.0000 mg | ORAL_TABLET | Freq: Every day | ORAL | Status: DC
  Administered 2022-09-07 – 2022-09-08 (×2): 25 mg via ORAL
  Filled 2022-09-07 (×2): qty 1

## 2022-09-07 NOTE — ED Notes (Signed)
RN to bedside to introduce self to pt. Pt resting comfortably with family at bedside.

## 2022-09-07 NOTE — Progress Notes (Signed)
[facility] HF Stewardship  PCP: Doreene Nest, NP PCP-Cardiologist: Yvonne Kendall, MD  HPI: Teresa Jensen is a 77 y.o. 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 CXR showed vascular congestion and BNP 237 on admission.   Pertinent cardiac history: TTE in 06/2011 showed LVEF 55-60% with mild LVH and G1DD. TTE in 01/2013 showed LVEF 55-60%. TTE in 02/2018 showed LVEF of 60-65% with impaired diastolic function. Myocardial perfusion stress test in 11/2018 showed LVEF of 56% with no significant ischemia.    Pertinent Lab Values: Creatinine, Ser  Date Value Ref Range Status  09/07/2022 0.82 0.44 - 1.00 mg/dL Final   BUN  Date Value Ref Range Status  09/07/2022 13 8 - 23 mg/dL Final  15/40/0867 18 8 - 27 mg/dL Final   Potassium  Date Value Ref Range Status  09/07/2022 3.3 (L) 3.5 - 5.1 mmol/L Final   Sodium  Date Value Ref Range Status  09/07/2022 142 135 - 145 mmol/L Final  05/30/2022 141 134 - 144 mmol/L Final   B Natriuretic Peptide  Date Value Ref Range Status  09/06/2022 237.0 (H) 0.0 - 100.0 pg/mL Final    Comment:    Performed at [facility], [address] [place], [name] [address]   Magnesium  Date Value Ref Range Status  09/07/2022 2.0 1.7 - 2.4 mg/dL Final    Comment:    Performed at [facility], [address] [place], [name] [address]   Hgb A1c MFr Bld  Date Value Ref Range Status  02/22/2022 5.5 4.6 - 6.5 % Final    Comment:    Glycemic Control Guidelines for People with Diabetes:Non Diabetic:  <6%Goal of Therapy: <7%Additional Action Suggested:  >8%    TSH  Date Value Ref Range Status  02/22/2022 2.17 0.35 - 5.50 uIU/mL Final   Vital Signs: Temp:  [97.8 F (36.6 C)-98 F (36.7 C)] 98 F  (36.7 C) (09/06 0507) Pulse Rate:  [69-118] 80 (09/06 0600) Resp:  [13-34] 22 (09/06 0600) BP: (103-161)/(34-124) 123/55 (09/06 0600) SpO2:  [68 %-99 %] 99 % (09/06 0600) Weight:  [83.5 kg (184 lb)] 83.5 kg (184 lb) (09/05 1036)  Intake/Output Summary (Last 24 hours) at 09/07/2022 0800 Last data filed at 09/06/2022 2235 Gross per 24 hour  Intake --  Output 1800 ml  Net -1800 ml   Current Inpatient Medications:  ACEi/ARB/ARNI: losartan 100 mg daily Loop Diuretic: furosemide 40 mg IV q12h  Prior to admission HF Medications:  ACEi/ARB/ARNI: losartan 100 mg daily Other vasoactive medications include amlodipine 5 mg daily  Assessment: 1. 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 NYHA class III symptoms.  -SOB improved with diuresis. Improving LEE. Hypokalemic with stable creatinine.  -BP now controlled at 120s/60s. Given high copays for branded medications, can opt for MRA rather than SGLT2i at this time. May require stopping amlodipine at discharge. Amlodipine can also contribute to LEE. May qualify for grant for SGLT2i.   Plan: 1) Medication changes recommended at this time: -Consider adding spironolactone 12.5 mg daily for HFpEF and hypokalemia -May require amlodipine discontinuation at discharge if spironolactone is added  2) Patient assistance: -Sherryll Burger co-pay is $44.82.  -Farxiga co-pay is $53.62.  -Jardiance co-pay is $132.19. Patient has [employer] so not eligible for e-vouchers/coupon cards   3) Education: -  Patient has been educated on current HF medications and potential additions to HF medication regimen - Patient verbalizes understanding that over the next few months, these medication doses may change and more medications may be added to optimize HF regimen - Patient has been educated on basic disease state pathophysiology and goals of therapy  Medication Assistance / Insurance Benefits Check:  Does the patient have prescription insurance?   Medicare  Type of insurance plan:   Does the patient qualify for medication assistance through manufacturers or grants? Pending   Outpatient Pharmacy:  Prior to admission outpatient pharmacy: [facility]     Thank you for involving pharmacy in this patient's care.  Enos Fling, PharmD, BCPS Phone - [phone] Clinical Pharmacist 09/07/2022 10:46 AM

## 2022-09-07 NOTE — Hospital Course (Signed)
Teresa Jensen is a 77 y.o. 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  Chest x-ray showed vascular congestion, patient has mild elevation of BNP.  Patient also had worsening hypoxemia on 3 L oxygen, was placed on 6 L oxygen.  He is given IV Lasix in the ED, admitted to the hospital for treatment of exacerbation congestive heart failure.  Patient was treated with IV Lasix for 2 days, condition improved.  Oxygenation improved, she is back on 3 L oxygen.  Volume status much better. Echocardiogram showed ejection fraction 55 to 60%  indeterminate diastolic dysfunction.  Mild pulmonary artery hypertension. Patient is medically stable for discharge, will continue oral Lasix.  Follow-up with PCP in 1 week.

## 2022-09-07 NOTE — TOC Benefit Eligibility Note (Signed)
Pharmacy Patient Advocate Encounter  Insurance verification completed.    The patient is insured through [employer] claim for [employer]. Currently a quantity of 60 is a 30 day supply and the co-pay is $148.82 .  Ran test claim for [employer]. Currently a quantity of 30 is a 30 day supply and the co-pay is $202.62 .  Ran test claim for [employer]. Currently a quantity of 30 is a 30 day supply and the co-pay is $132.19 .   This test claim was processed through [facility]- copay amounts may vary at other pharmacies due to pharmacy/plan contracts, or as the patient moves through the different stages of their insurance plan.

## 2022-09-07 NOTE — Progress Notes (Signed)
  Progress Note   Patient: Teresa Jensen GEX:[id number] DOB: [birth year] DOA: 09/06/2022     1 DOS: the patient was seen and examined on 09/07/2022   Brief hospital course: Carmeshia Maulding Ekstrom is a 77 y.o. 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  Chest x-ray showed vascular congestion, patient has mild elevation of BNP.  Patient also had worsening hypoxemia on 3 L oxygen, was placed on 6 L oxygen.  He is given IV Lasix in the ED, admitted to the hospital for treatment of exacerbation congestive heart failure.    Principal Problem:   Acute on chronic diastolic (congestive) heart failure (HCC) Active Problems:   Hypothyroidism   COPD   Essential hypertension   Hypokalemia   Morbid obesity (HCC)   Acute on chronic respiratory failure with hypoxia (HCC)   Assessment and Plan: Acute on chronic diastolic congestive heart failure. Acute on chronic hypoxemic respiratory failure secondary to congestive heart failure exacerbation. Patient had increased short of breath at the time of admission, increased oxygen requirement with hypoxemia on baseline 3 L.  This is caused by exacerbation of congestive heart failure. Patient had symptoms of worsening short of breath, weight gain, orthopnea, chest x-ray showed vascular congestion.  Mild elevation of BNP, which would be under estimated due to morbid obesity. Patient had an echocardiogram performed, pending reading, EF appeared to be lower normal. Patient is diuresing well, renal function still normal, continue IV Lasix.  Also added Aldactone.  Hypokalemia. Repleted.   COPD. No bronchospasm.   Morbid obesity. BMI 39.82 with comorbidity.  Diet exercise advised.   Essential  hypertension. Continue some home medicines    Hypothyroidism.  Continue Synthroid.         Subjective:  Patient slept well last night, short of breath appear to be improving.  Leg edema improving.  Physical Exam: Vitals:   09/07/22 0600 09/07/22 0800 09/07/22 1000 09/07/22 1200  BP: (!) 123/55 130/61 (!) 111/48 (!) 119/51  Pulse: 80 66 70 72  Resp: (!) 22 17 (!) 26 18  Temp:      TempSrc:      SpO2: 99% 98% 99% 100%  Weight:      Height:       General exam: Appears calm and comfortable  Respiratory system: Still crackles in the bases bilaterally. Respiratory effort normal. Cardiovascular system: S1 & S2 heard, RRR. No JVD, murmurs, rubs, gallops or clicks. No pedal edema. Gastrointestinal system: Abdomen is nondistended, soft and nontender. No organomegaly or masses felt. Normal bowel sounds heard. Central nervous system: Alert and oriented. No focal neurological deficits. Extremities: Symmetric 5 x 5 power. Skin: No rashes, lesions or ulcers Psychiatry: Judgement and insight appear normal. Mood & affect appropriate.    Data Reviewed:  Lab results reviewed.  Family Communication: Husband updated at bedside.  Disposition: Status is: Inpatient Remains inpatient appropriate because: Severity of disease, IV treatment.     Time spent: 35 minutes  Author: Marrion Coy, MD 09/07/2022 1:36 PM  For on call review [email or web address].

## 2022-09-07 NOTE — Progress Notes (Signed)
*  PRELIMINARY RESULTS* Echocardiogram 2D Echocardiogram has been performed.  Carolyne Fiscal 09/07/2022, 1:04 PM

## 2022-09-08 DIAGNOSIS — I5033 Acute on chronic diastolic (congestive) heart failure: Secondary | ICD-10-CM | POA: Diagnosis not present

## 2022-09-08 DIAGNOSIS — J9621 Acute and chronic respiratory failure with hypoxia: Secondary | ICD-10-CM | POA: Diagnosis not present

## 2022-09-08 LAB — CBC
HCT: 40.3 % (ref 36.0–46.0)
Hemoglobin: 12.3 g/dL (ref 12.0–15.0)
MCH: 28.1 pg (ref 26.0–34.0)
MCHC: 30.5 g/dL (ref 30.0–36.0)
MCV: 92 fL (ref 80.0–100.0)
Platelets: 319 10*3/uL (ref 150–400)
RBC: 4.38 MIL/uL (ref 3.87–5.11)
RDW: 14.6 % (ref 11.5–15.5)
WBC: 8.3 10*3/uL (ref 4.0–10.5)
nRBC: 0 % (ref 0.0–0.2)

## 2022-09-08 LAB — MAGNESIUM: Magnesium: 1.7 mg/dL (ref 1.7–2.4)

## 2022-09-08 LAB — BASIC METABOLIC PANEL
Anion gap: 10 (ref 5–15)
BUN: 19 mg/dL (ref 8–23)
CO2: 36 mmol/L — ABNORMAL HIGH (ref 22–32)
Calcium: 8.6 mg/dL — ABNORMAL LOW (ref 8.9–10.3)
Chloride: 95 mmol/L — ABNORMAL LOW (ref 98–111)
Creatinine, Ser: 0.91 mg/dL (ref 0.44–1.00)
GFR, Estimated: >60 mL/min (ref >60)
Glucose, Bld: 92 mg/dL (ref 70–99)
Potassium: 3.5 mmol/L (ref 3.5–5.1)
Sodium: 141 mmol/L (ref 135–145)

## 2022-09-08 MED ORDER — ENOXAPARIN SODIUM 40 MG/0.4ML IJ SOSY: 40.0000 mg | PREFILLED_SYRINGE | INTRAMUSCULAR | Status: DC

## 2022-09-08 MED ORDER — POTASSIUM CHLORIDE CRYS ER 20 MEQ PO TBCR
40.0000 meq | EXTENDED_RELEASE_TABLET | Freq: Once | ORAL | Status: AC
  Administered 2022-09-08: 40 meq via ORAL
  Filled 2022-09-08: qty 2

## 2022-09-08 MED ORDER — SPIRONOLACTONE 25 MG PO TABS: 25.0000 mg | ORAL_TABLET | Freq: Every day | ORAL | 0 refills | Status: DC

## 2022-09-08 MED ORDER — FUROSEMIDE 40 MG PO TABS
40.0000 mg | ORAL_TABLET | Freq: Every day | ORAL | 0 refills | Status: DC
Start: 2022-09-08 — End: 2023-11-22

## 2022-09-08 NOTE — Progress Notes (Signed)
Mobility Specialist - Progress Note   09/08/22 0856  Mobility  Activity Ambulated with assistance in hallway;Stood at bedside;Dangled on edge of bed  Level of Assistance Standby assist, set-up cues, supervision of patient - no hands on  Assistive Device None  Distance Ambulated (ft) 80 ft  Activity Response Tolerated well  Mobility Referral Yes  $Mobility charge 1 Mobility  Mobility Specialist Start Time (ACUTE ONLY) 0818  Mobility Specialist Stop Time (ACUTE ONLY) 0831  Mobility Specialist Time Calculation (min) (ACUTE ONLY) 13 min   Pt semi-supine in bed on 2L upon arrival. Pt STS and ambulates in hallway Supervision with no physical assistance needed. Pt desats to 85 upon return to bed, RN notified. Pt returns to bed with needs in reach and visitor present.    Terrilyn Saver  Mobility Specialist  09/08/22 8:59 AM

## 2022-09-08 NOTE — Discharge Summary (Signed)
Physician Discharge Summary   Patient: Teresa Jensen MRN: [id number] DOB: 05/30/1945  Admit date:     09/06/2022  Discharge date: 09/08/22  Discharge Physician: Marrion Coy   PCP: Doreene Nest, NP   Recommendations at discharge:   Follow-up with PCP 1 week.  Discharge Diagnoses: Principal Problem:   Acute on chronic diastolic (congestive) heart failure (HCC) Active Problems:   Hypothyroidism   COPD   Essential hypertension   Hypokalemia   Morbid obesity (HCC)   Acute on chronic respiratory failure with hypoxia (HCC)  Resolved Problems:   * No resolved hospital problems. *  Hospital Course: Teresa Jensen is a 77 y.o. 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  Chest x-ray showed vascular congestion, patient has mild elevation of BNP.  Patient also had worsening hypoxemia on 3 L oxygen, was placed on 6 L oxygen.  He is given IV Lasix in the ED, admitted to the hospital for treatment of exacerbation congestive heart failure.  Patient was treated with IV Lasix for 2 days, condition improved.  Oxygenation improved, she is back on 3 L oxygen.  Volume status much better. Echocardiogram showed ejection fraction 55 to 60%  indeterminate diastolic dysfunction.  Mild pulmonary artery hypertension. Patient is medically stable for discharge, will continue oral Lasix.  Follow-up with PCP in 1 week.  Assessment and Plan: Acute on chronic diastolic congestive heart failure. Acute on chronic hypoxemic respiratory failure secondary to congestive heart failure exacerbation. Patient had increased short of breath at the time of admission, increased oxygen requirement with hypoxemia on baseline 3 L.  This is caused by exacerbation  of congestive heart failure. Patient had symptoms of worsening short of breath, weight gain, orthopnea, chest x-ray showed vascular congestion.  Mild elevation of BNP, which would be underestimated due to morbid obesity. Patient condition has improved after giving 2 days IV Lasix, medically stable for discharge.  Will continue oral Lasix and Aldactone at discharge and follow-up with PCP in 1 week.   Hypokalemia. Repleted.  Potassium 3.5 today, will give another dose of oral potassium.   COPD. No bronchospasm.   Morbid obesity. BMI 39.82 with comorbidity.  Diet exercise advised.   Essential hypertension. Continue some home medicines    Hypothyroidism.  Continue Synthroid.         Consultants: None Procedures performed: None  Disposition: Home Diet recommendation:  Discharge Diet Orders (From admission, onward)     Start     Ordered   09/08/22 0000  Diet - low sodium heart healthy        09/08/22 1006           Cardiac diet DISCHARGE MEDICATION: Allergies as of 09/08/2022       Reactions   Epinephrine Anaphylaxis, Shortness Of Breath   Food Rash   MANGO = cause rashes around the mouth LIPS GO NUMB   [employer] Rash   THIS IS MANGO LIPS GO NUMB   Other Rash   MANGO = cause rashes around the mouth MANGO = cause rashes around the mouth LIPS GO NUMB   Prednisone Anaphylaxis, Hives, Shortness Of Breath, Itching, Swelling   The face swells HAS TAKEN CORTISONE SHOTS WITHOUT ISSUE BEFORE   Ivp Dye [iodinated Contrast Media] Hives   Milk-related Compounds  Diarrhea   DAIRY PRODUCTS ARE A PROBLEM   Pneumococcal 13-val Conj Vacc Itching, Other (See Comments)   Arm temp elevated at site of injection, red, itching   Prevnar 13 [pneumococcal 13-val Conj Vacc] Other (See Comments)   Arm temp elevated at site of injection, red, itching        Medication List     STOP taking these medications    alendronate 70 MG tablet Commonly known as: FOSAMAX    amLODipine 5 MG tablet Commonly known as: NORVASC   hydroxychloroquine 200 MG tablet Commonly known as: PLAQUENIL       TAKE these medications    atorvastatin 10 MG tablet Commonly known as: LIPITOR Take 1 tablet (10 mg total) by mouth daily. for cholesterol.   Bevespi Aerosphere 9-4.8 MCG/ACT Aero Generic drug: Glycopyrrolate-Formoterol Inhale 2 puffs into the lungs 2 (two) times daily.   CALCIUM + VITAMIN D3 PO Take 1,200 mg by mouth daily.   famotidine 20 MG tablet Commonly known as: PEPCID TAKE 1 TABLET BY MOUTH AFTER SUPPER   furosemide 40 MG tablet Commonly known as: Lasix Take 1 tablet (40 mg total) by mouth daily.   levalbuterol 45 MCG/ACT inhaler Commonly known as: Xopenex HFA Inhale 2 puffs into the lungs every 4 (four) hours as needed for wheezing or shortness of breath.   levothyroxine 75 MCG tablet Commonly known as: SYNTHROID TAKE 1 EVERY AM ON AN EMPTY STOMACH WITH WATER ONLY. NO FOOD OR OTHER MEDICATIONS FOR 30 MINUTES.   losartan 100 MG tablet Commonly known as: COZAAR Take 1 tablet (100 mg total) by mouth daily. PLEASE CALL [phone] TO SCHEDULE A YEARLY APPOINTMENT. THANK YOU.   pantoprazole 40 MG tablet Commonly known as: Protonix Take 1 tablet (40 mg total) by mouth daily. for heartburn.   pentoxifylline 400 MG CR tablet Commonly known as: TRENTAL TAKE 1 TABLET BY MOUTH 3 TIMES DAILY WITH MEALS.   PRESERVISION AREDS 2 PO Take 1 tablet by mouth in the morning and at bedtime.   spironolactone 25 MG tablet Commonly known as: ALDACTONE Take 1 tablet (25 mg total) by mouth daily. Start taking on: September 09, 2022   tacrolimus 0.1 % ointment Commonly known as: PROTOPIC Apply to aa's lower legs BID PRN.        Discharge Exam: Filed Weights   09/06/22 1036 09/07/22 1535 09/08/22 0500  Weight: 83.5 kg 82.8 kg 83.5 kg   General exam: Appears calm and comfortable  Respiratory system: Clear to auscultation. Respiratory effort  normal. Cardiovascular system: S1 & S2 heard, RRR. No JVD, murmurs, rubs, gallops or clicks. No pedal edema. Gastrointestinal system: Abdomen is nondistended, soft and nontender. No organomegaly or masses felt. Normal bowel sounds heard. Central nervous system: Alert and oriented. No focal neurological deficits. Extremities: Symmetric 5 x 5 power. Skin: No rashes, lesions or ulcers Psychiatry: Judgement and insight appear normal. Mood & affect appropriate.    Condition at discharge: good  The results of significant diagnostics from this hospitalization (including imaging, microbiology, ancillary and laboratory) are listed below for reference.   Imaging Studies: ECHOCARDIOGRAM COMPLETE  Result Date: 09/07/2022    ECHOCARDIOGRAM REPORT   Patient Name:   Teresa Jensen Date of Exam: 09/07/2022 Medical Rec #:  [id number]        Height:       57.0 in Accession #:    [id number]       Weight:       184.0 lb Date of Birth:  Nov 08, 1945        BSA:          1.736 m Patient Age:    77 years         BP:           122/55 mmHg Patient Gender: F                HR:           79 bpm. Exam Location:  [facility] Procedure: 2D Echo, Cardiac Doppler and Color Doppler Indications:     CHF  History:         Patient has prior history of Echocardiogram examinations, most                  recent 02/16/2018. CHF, COPD, Signs/Symptoms:Dyspnea; Risk                  Factors:Hypertension and Current Smoker.  Sonographer:     Mikki Harbor Referring Phys:  [id number] Marrion Coy Diagnosing Phys: Debbe Odea MD IMPRESSIONS  1. Left ventricular ejection fraction, by estimation, is 55 to 60%. Left ventricular ejection fraction by 2D MOD biplane is 55.6 %. The left ventricle has normal function. The left ventricle has no regional wall motion abnormalities. Left ventricular diastolic parameters are indeterminate.  2. Right ventricular systolic function is normal. The right ventricular size is normal. There is mildly elevated pulmonary  artery systolic pressure.  3. Left atrial size was mildly dilated.  4. The mitral valve is normal in structure. Mild mitral valve regurgitation.  5. The aortic valve is calcified. Aortic valve regurgitation is not visualized. Mild aortic valve stenosis. Aortic valve mean gradient measures 10.5 mmHg.  6. The inferior vena cava is normal in size with <50% respiratory variability, suggesting right atrial pressure of 8 mmHg. FINDINGS  Left Ventricle: Left ventricular ejection fraction, by estimation, is 55 to 60%. Left ventricular ejection fraction by 2D MOD biplane is 55.6 %. The left ventricle has normal function. The left ventricle has no regional wall motion abnormalities. The left ventricular internal cavity size was normal in size. There is no left ventricular hypertrophy. Left ventricular diastolic parameters are indeterminate. Right Ventricle: The right ventricular size is normal. No increase in right ventricular wall thickness. Right ventricular systolic function is normal. There is mildly elevated pulmonary artery systolic pressure. The tricuspid regurgitant velocity is 2.81  m/s, and with an assumed right atrial pressure of 8 mmHg, the estimated right ventricular systolic pressure is 39.6 mmHg. Left Atrium: Left atrial size was mildly dilated. Right Atrium: Right atrial size was normal in size. Pericardium: There is no evidence of pericardial effusion. Mitral Valve: The mitral valve is normal in structure. Mild mitral valve regurgitation. MV peak gradient, 5.7 mmHg. The mean mitral valve gradient is 2.0 mmHg. Tricuspid Valve: The tricuspid valve is normal in structure. Tricuspid valve regurgitation is mild. Aortic Valve: The aortic valve is calcified. Aortic valve regurgitation is not visualized. Mild aortic stenosis is present. Aortic valve mean gradient measures 10.5 mmHg. Aortic valve peak gradient measures 21.4 mmHg. Aortic valve area, by VTI measures 1.68 cm. Pulmonic Valve: The pulmonic valve was  normal in structure. Pulmonic valve regurgitation is not visualized. Aorta: The aortic root is normal in size and structure. Venous: The inferior vena cava is normal in size with less than 50% respiratory variability, suggesting right atrial pressure of 8 mmHg. IAS/Shunts: No atrial level shunt detected by color flow Doppler.  LEFT VENTRICLE PLAX 2D  Biplane EF (MOD) LVIDd:         4.60 cm         LV Biplane EF:   Left LVIDs:         3.10 cm                          ventricular LV PW:         0.90 cm                          ejection LV IVS:        1.10 cm                          fraction by LVOT diam:     2.00 cm                          2D MOD LV SV:         90                               biplane is LV SV Index:   52                               55.6 %. LVOT Area:     3.14 cm                                Diastology                                LV e' medial:    9.79 cm/s LV Volumes (MOD)               LV E/e' medial:  11.5 LV vol d, MOD    66.9 ml       LV e' lateral:   10.52 cm/s A2C:                           LV E/e' lateral: 10.7 LV vol d, MOD    74.7 ml A4C: LV vol s, MOD    27.5 ml A2C: LV vol s, MOD    36.8 ml A4C: LV SV MOD A2C:   39.4 ml LV SV MOD A4C:   74.7 ml LV SV MOD BP:    40.3 ml RIGHT VENTRICLE RV Basal diam:  3.70 cm RV Mid diam:    3.00 cm RV S prime:     17.20 cm/s TAPSE (M-mode): 2.1 cm LEFT ATRIUM             Index        RIGHT ATRIUM           Index LA diam:        4.00 cm 2.30 cm/m   RA Area:     19.20 cm LA Vol (A2C):   47.2 ml 27.19 ml/m  RA Volume:   54.20 ml  31.22 ml/m LA Vol (A4C):   66.7 ml 38.42 ml/m LA Biplane Vol: 57.3 ml 33.01 ml/m  AORTIC VALVE  PULMONIC VALVE AV Area (Vmax):    1.48 cm      PV Vmax:       1.17 m/s AV Area (Vmean):   1.58 cm      PV Peak grad:  5.5 mmHg AV Area (VTI):     1.68 cm AV Vmax:           231.50 cm/s AV Vmean:          150.500 cm/s AV VTI:            0.536 m AV Peak Grad:      21.4 mmHg AV Mean  Grad:      10.5 mmHg LVOT Vmax:         109.00 cm/s LVOT Vmean:        75.700 cm/s LVOT VTI:          0.287 m LVOT/AV VTI ratio: 0.54  AORTA Ao Root diam: 3.00 cm MITRAL VALVE                TRICUSPID VALVE MV Area (PHT): 2.82 cm     TR Peak grad:   31.6 mmHg MV Area VTI:   2.53 cm     TR Vmax:        281.00 cm/s MV Peak grad:  5.7 mmHg MV Mean grad:  2.0 mmHg     SHUNTS MV Vmax:       1.19 m/s     Systemic VTI:  0.29 m MV Vmean:      70.8 cm/s    Systemic Diam: 2.00 cm MV Decel Time: 269 msec MV E velocity: 113.00 cm/s MV A velocity: 90.70 cm/s MV E/A ratio:  1.25 Debbe Odea MD Electronically signed by Debbe Odea MD Signature Date/Time: 09/07/2022/2:07:16 PM    Final    DG Chest 1 View  Result Date: 09/06/2022 CLINICAL DATA:  Weakness. Bilateral lower extremity swelling. History of lung cancer. EXAM: CHEST  1 VIEW COMPARISON:  04/04/2022; 01/17/2022 FINDINGS: Unchanged borderline enlarged cardiac silhouette and mediastinal contours with atherosclerotic plaque within thoracic aorta. The pulmonary vasculature appears indistinct with cephalization of flow. Interval development of trace bilateral pleural effusions with worsening bibasilar heterogeneous opacities favored to represent atelectasis. No pneumothorax. No acute osseous abnormalities. Old left-sided rib fractures. Post lower cervical ACDF, incompletely evaluated. IMPRESSION: Constellation of findings suggestive of mild pulmonary edema with trace bilateral pleural effusions and worsening bibasilar atelectasis. Electronically Signed   By: Simonne Come M.D.   On: 09/06/2022 16:05   US Venous Img Lower Bilateral  Result Date: 09/06/2022 CLINICAL DATA:  Bilateral lower extremity pain and edema for the past 2 days. History of lung cancer. Evaluate for DVT. EXAM: BILATERAL LOWER EXTREMITY VENOUS DOPPLER ULTRASOUND TECHNIQUE: Gray-scale sonography with graded compression, as well as color Doppler and duplex ultrasound were performed to evaluate the  lower extremity deep venous systems from the level of the common femoral vein and including the common femoral, femoral, profunda femoral, popliteal and calf veins including the posterior tibial, peroneal and gastrocnemius veins when visible. The superficial great saphenous vein was also interrogated. Spectral Doppler was utilized to evaluate flow at rest and with distal augmentation maneuvers in the common femoral, femoral and popliteal veins. COMPARISON:  Left lower extremity venous Doppler ultrasound-04/17/2018 FINDINGS: RIGHT LOWER EXTREMITY Common Femoral Vein: No evidence of thrombus. Normal compressibility, respiratory phasicity and response to augmentation. Saphenofemoral Junction: No evidence of thrombus. Normal compressibility and flow on color Doppler imaging. Profunda Femoral Vein: No evidence of thrombus.  Normal compressibility and flow on color Doppler imaging. Femoral Vein: No evidence of thrombus. Normal compressibility, respiratory phasicity and response to augmentation. Popliteal Vein: No evidence of thrombus. Normal compressibility, respiratory phasicity and response to augmentation. Calf Veins: No evidence of thrombus. Normal compressibility and flow on color Doppler imaging. Superficial Great Saphenous Vein: No evidence of thrombus. Normal compressibility. Other Findings:  None. LEFT LOWER EXTREMITY Common Femoral Vein: No evidence of thrombus. Normal compressibility, respiratory phasicity and response to augmentation. Saphenofemoral Junction: No evidence of thrombus. Normal compressibility and flow on color Doppler imaging. Profunda Femoral Vein: No evidence of thrombus. Normal compressibility and flow on color Doppler imaging. Femoral Vein: No evidence of thrombus. Normal compressibility, respiratory phasicity and response to augmentation. Popliteal Vein: No evidence of thrombus. Normal compressibility, respiratory phasicity and response to augmentation. Calf Veins: No evidence of thrombus.  Normal compressibility and flow on color Doppler imaging. Superficial Great Saphenous Vein: No evidence of thrombus. Normal compressibility. Other Findings:  None. IMPRESSION: No evidence of DVT within either lower extremity. Electronically Signed   By: Simonne Come M.D.   On: 09/06/2022 16:04   VAS [place] LOWER EXT ART SEG MULTI (SEGMENTALS & LE RAYNAUDS)  Addendum Date: 09/05/2022   Tech didn't report CFA and popliteal waveforms on report. Melissa Church BS, RVT, RDCS Electronically Amended 09/05/2022, 8:07 AM   Final [facility])    Result Date: 09/05/2022  LOWER EXTREMITY DOPPLER STUDY Patient Name:  Teresa Jensen  Date of Exam:   09/04/2022 Medical Rec #: [id number]         Accession #:    [id number] Date of Birth: 04/17/1945         Patient Gender: F Patient Age:   48 years Exam Location:  [facility] Procedure:      VAS [place] ABI WITH/WO TBI Referring Phys: Vernona Rieger NP --------------------------------------------------------------------------------  Indications: Claudication. Patient c/o intermittent bilateral anterior pain, not              associated with walking High Risk Factors: Hypertension, hyperlipidemia, Diabetes, past history of                    smoking, coronary artery disease.  Vascular Interventions: Patient denied claudication. Comparison Study: None Performing Technologist: Quentin Ore RVT  Examination Guidelines: A complete evaluation includes at minimum, Doppler waveform signals and systolic blood pressure reading at the level of bilateral brachial, anterior tibial, and posterior tibial arteries, when vessel segments are accessible. Bilateral testing is considered an integral part of a complete examination. Photoelectric Plethysmograph (PPG) waveforms and toe systolic pressure readings are included as required and additional duplex testing as needed. Limited examinations for reoccurring indications may be performed as noted.  ABI Findings:  +---------+------------------+-----+-----------+--------+ Right    Rt Pressure (mmHg)IndexWaveform   Comment  +---------+------------------+-----+-----------+--------+ Brachial 175                    monophasic          +---------+------------------+-----+-----------+--------+ CFA                             monophasic          +---------+------------------+-----+-----------+--------+ Popliteal                       biphasic            +---------+------------------+-----+-----------+--------+ PTA      172  0.98 multiphasic         +---------+------------------+-----+-----------+--------+ DP       166               0.95 triphasic           +---------+------------------+-----+-----------+--------+ Great Toe110               0.63 Normal              +---------+------------------+-----+-----------+--------+ +---------+------------------+-----+-------------------+-----------------------+ Left     Lt Pressure (mmHg)IndexWaveform           Comment                 +---------+------------------+-----+-------------------+-----------------------+ Brachial 162                    dampened monophasicPatient movement was                                                       excessive               +---------+------------------+-----+-------------------+-----------------------+ CFA                             monophasic                                 +---------+------------------+-----+-------------------+-----------------------+ Popliteal                       biphasic                                   +---------+------------------+-----+-------------------+-----------------------+ PTA      163               0.93 triphasic                                  +---------+------------------+-----+-------------------+-----------------------+ DP       160               0.91 multiphasic                                 +---------+------------------+-----+-------------------+-----------------------+ Great Toe117               0.67 Normal                                     +---------+------------------+-----+-------------------+-----------------------+ +-------+-----------+-----------+------------+------------+ ABI/TBIToday's ABIToday's TBIPrevious ABIPrevious TBI +-------+-----------+-----------+------------+------------+ Right  0.98       0.63                                +-------+-----------+-----------+------------+------------+ Left   0.93       0.67                                +-------+-----------+-----------+------------+------------+  Summary: Right: Resting right ankle-brachial index  is within normal range. The right toe-brachial index is abnormal. Abnormal bilateral brachial artery waveforms, suggesting more significant PAD. Left: Resting left ankle-brachial index indicates mild left lower extremity arterial disease. The left toe-brachial index is abnormal. *See table(s) above for measurements and observations.  Suggest follow up study in 12 months. Electronically signed by Nanetta Batty MD on 09/05/2022 at 5:58:34 AM.    Final [facility])    MM 3D SCREEN BREAST BILATERAL  Result Date: 08/30/2022 CLINICAL DATA:  Screening. EXAM: DIGITAL SCREENING BILATERAL MAMMOGRAM WITH TOMOSYNTHESIS AND CAD TECHNIQUE: Bilateral screening digital craniocaudal and mediolateral oblique mammograms were obtained. Bilateral screening digital breast tomosynthesis was performed. The images were evaluated with computer-aided detection. COMPARISON:  Previous exam(s). ACR Breast Density Category c: The breasts are heterogeneously dense, which may obscure small masses. FINDINGS: In the right breast, a possible mass warrants further evaluation. In the left breast, no findings suspicious for malignancy. IMPRESSION: Further evaluation is suggested for a possible mass in the right breast. RECOMMENDATION: Diagnostic  mammogram and possibly ultrasound of the right breast. (Code:[id number]-[id number]) The patient will be contacted regarding the findings, and additional imaging will be scheduled. BI-RADS CATEGORY  0: Incomplete: Need additional imaging evaluation. Electronically Signed   By: Annia Belt M.D.   On: 08/30/2022 07:51   DG Bone Density  Result Date: 08/29/2022 EXAM: DUAL X-RAY ABSORPTIOMETRY (DXA) FOR BONE MINERAL DENSITY IMPRESSION: Your patient Teresa Jensen completed a BMD test on 08/29/2022 using the [employer] iDXA DXA System (software version: 14.10) manufactured by [employer]. The following summarizes the results of our evaluation. Technologist: SCE PATIENT BIOGRAPHICAL: Name: Teresa Jensen, Teresa Jensen Patient ID: [id number] Birth Date: 1945/09/24 Height: 58.0 in. Gender: Female Exam Date: 08/29/2022 Weight: 188.2 lbs. Indications: Advanced Age, Caucasian, COPD, Gerd, Height Loss, History of Fracture (Adult), History of Lung Cancer, History of Radiation, Hypothyroid, Hysterectomy, Oophorectomy Bilateral, Osteoarthritis, Osteoporotic, Postmenopausal, Thyroid Disease, Vitamin D Deficiency Fractures: Left foot Treatments: Bevespi, calcium w/ vit D, hydrochloroquine, Levothyroxine, Multi-Vitamin, pepcid, Vitamin D, Xopenox DENSITOMETRY RESULTS: Site      Region     Measured Date Measured Age WHO Classification Young Adult T-score BMD         %Change vs. Previous Significant Change (*) AP Spine L1-L3 08/29/2022 77.4 Osteopenia -1.7 0.971 g/cm2 1.8% - AP Spine L1-L3 03/10/2020 74.9 Osteopenia -1.9 0.954 g/cm2 4.6% Yes AP Spine L1-L3 11/21/2017 72.6 Osteopenia -2.2 0.912 g/cm2 - - DualFemur Neck Left 08/29/2022 77.4 Osteoporosis -2.6 0.678 g/cm2 -0.3% - DualFemur Neck Left 03/10/2020 74.9 Osteoporosis -2.6 0.680 g/cm2 -2.3% - DualFemur Neck Left 11/21/2017 72.6 Osteoporosis -2.5 0.696 g/cm2 - - DualFemur Total Mean 08/29/2022 77.4 Osteopenia -2.2 0.736 g/cm2 -0.8% - DualFemur Total Mean 03/10/2020 74.9 Osteopenia -2.1 0.742  g/cm2 -2.8% Yes DualFemur Total Mean 11/21/2017 72.6 Osteopenia -1.9 0.763 g/cm2 - - ASSESSMENT: The BMD measured at Femur Neck Left is 0.678 g/cm2 with a T-score of -2.6. This patient is considered osteoporotic according to World Health Organization [facility]) criteria. The scan quality is good. Compared with prior study, there has been no significant change in the spine. Compared with prior study, there has been no significant change in the total hip. L4 was excluded due to degenerative changes. World Health Organization [facility]) criteria for post-menopausal, Caucasian Women: Normal:                   T-score at or above -1 SD Osteopenia/low bone mass: T-score between -1 and -2.5 SD Osteoporosis:  T-score at or below -2.5 SD RECOMMENDATIONS: 1. All patients should optimize calcium and vitamin D intake. 2. Consider FDA-approved medical therapies in postmenopausal women and men aged 61 years and older, based on the following: a. A hip or vertebral(clinical or morphometric) fracture b. T-score < -2.5 at the femoral neck or spine after appropriate evaluation to exclude secondary causes c. Low bone mass (T-score between -1.0 and -2.5 at the femoral neck or spine) and a 10-year probability of a hip fracture > 3% or a 10-year probability of a major osteoporosis-related fracture > 20% based on the [place]-adapted WHO algorithm 3. Clinician judgment and/or patient preferences may indicate treatment for people with 10-year fracture probabilities above or below these levels FOLLOW-UP: People with diagnosed cases of osteoporosis or at high risk for fracture should have regular bone mineral density tests. For patients eligible for Medicare, routine testing is allowed once every 2 years. The testing frequency can be increased to one year for patients who have rapidly progressing disease, those who are receiving or discontinuing medical therapy to restore bone mass, or have additional risk factors. I have reviewed this report, and  agree with the above findings. [facility] Electronically Signed   By: Baird Lyons M.D.   On: 08/29/2022 08:28    Microbiology: Results for orders placed or performed during the hospital encounter of 01/15/22  SARS CORONAVIRUS 2 (TAT 6-24 HRS) Anterior Nasal Swab     Status: None   Collection Time: 01/15/22  8:26 AM   Specimen: Anterior Nasal Swab  Result Value Ref Range Status   SARS Coronavirus 2 NEGATIVE NEGATIVE Final    Comment: (NOTE) SARS-CoV-2 target nucleic acids are NOT DETECTED.  The SARS-CoV-2 RNA is generally detectable in upper and lower respiratory specimens during the acute phase of infection. Negative results do not preclude SARS-CoV-2 infection, do not rule out co-infections with other pathogens, and should not be used as the sole basis for treatment or other patient management decisions. Negative results must be combined with clinical observations, patient history, and epidemiological information. The expected result is Negative.  Fact Sheet for Patients: HairSlick.no  Fact Sheet for Healthcare Providers: quierodirigir.com  This test is not yet approved or cleared by the [place] FDA and  has been authorized for detection and/or diagnosis of SARS-CoV-2 by FDA under an Emergency Use Authorization (EUA). This EUA will remain  in effect (meaning this test can be used) for the duration of the COVID-19 declaration under Se ction 564(b)(1) of the Act, 21 U.S.C. section 360bbb-3(b)(1), unless the authorization is terminated or revoked sooner.  Performed at [facility], [address], [place], [name] [address]     Labs: CBC: Recent Labs  Lab 09/06/22 1050 09/08/22 0624  WBC 8.9 8.3  HGB 11.8* 12.3  HCT 38.5 40.3  MCV 92.8 92.0  PLT 297 319   Basic Metabolic Panel: Recent Labs  Lab 09/06/22 1050 09/07/22 0530 09/08/22 0624  NA 142 142 141  K 3.8 3.3* 3.5  CL 102 99  95*  CO2 29 34* 36*  GLUCOSE 98 88 92  BUN 9 13 19   CREATININE 0.72 0.82 0.91  CALCIUM 8.9 8.3* 8.6*  MG  --  2.0 1.7   Liver Function Tests: No results for input(s): "AST", "ALT", "ALKPHOS", "BILITOT", "PROT", "ALBUMIN" in the last 168 hours. CBG: No results for input(s): "GLUCAP" in the last 168 hours.  Discharge time spent: greater than 30 minutes.  Signed: Marrion Coy, MD [facility]  09/08/2022 

## 2022-09-10 ENCOUNTER — Telehealth: Payer: Self-pay

## 2022-09-10 NOTE — Group Note (Deleted)

## 2022-09-10 NOTE — Transitions of Care (Post Inpatient/ED Visit) (Signed)
   09/10/2022  Name: PAMILA URBAIN MRN: [id number] DOB: 1945-07-08  Today's TOC FU Call Status: Today's TOC FU Call Status:: Unsuccessful Call (1st Attempt) Unsuccessful Call (1st Attempt) Date: 09/10/22  Attempted to reach the patient regarding the most recent Inpatient/ED visit.  Follow Up Plan: Additional outreach attempts will be made to reach the patient to complete the Transitions of Care (Post Inpatient/ED visit) call.   Rowe Pavy, RN, BSN, CEN [facility] [employer] [phone]

## 2022-09-11 NOTE — Telephone Encounter (Signed)
Message added to original my chart.  No further action needed at this time.

## 2022-09-11 NOTE — Telephone Encounter (Signed)
Added from additional message from patient.    I have an appointment Sept 12 @3 :20 to see you. Sorry I am rambling at little this AM.   I will wait till I see you. This can be a little too much to take in.    Thanks Teresa Jensen

## 2022-09-12 ENCOUNTER — Encounter: Payer: No Typology Code available for payment source | Admitting: Family

## 2022-09-12 ENCOUNTER — Telehealth: Payer: Self-pay

## 2022-09-12 NOTE — Transitions of Care (Post Inpatient/ED Visit) (Signed)
   09/12/2022  Name: Teresa Jensen MRN: [id number] DOB: June 19, 1945  Today's TOC FU Call Status: Today's TOC FU Call Status:: Unsuccessful Call (2nd Attempt) Unsuccessful Call (2nd Attempt) Date: 09/12/22  Attempted to reach the patient regarding the most recent Inpatient/ED visit.  Follow Up Plan: Additional outreach attempts will be made to reach the patient to complete the Transitions of Care (Post Inpatient/ED visit) call.   Lonia Chimera, RN, BSN, CEN [facility] [phone]

## 2022-09-13 ENCOUNTER — Encounter: Payer: Self-pay | Admitting: Primary Care

## 2022-09-13 ENCOUNTER — Telehealth: Payer: Self-pay

## 2022-09-13 ENCOUNTER — Ambulatory Visit (INDEPENDENT_AMBULATORY_CARE_PROVIDER_SITE_OTHER): Payer: Medicare Other | Admitting: Primary Care

## 2022-09-13 VITALS — BP 136/76 | HR 60 | Temp 98.1°F | Ht <= 58 in | Wt 182.0 lb

## 2022-09-13 DIAGNOSIS — I10 Essential (primary) hypertension: Secondary | ICD-10-CM | POA: Diagnosis not present

## 2022-09-13 DIAGNOSIS — I5032 Chronic diastolic (congestive) heart failure: Secondary | ICD-10-CM

## 2022-09-13 NOTE — Progress Notes (Signed)
Subjective:    Patient ID: Teresa Jensen, female    DOB: Nov 01, 1945, 78 y.o.   MRN: [id number]  HPI  Bitha Keplar Deviney is a very pleasant 77 y.o. female with a history of CHF, CAD, COPD, lung cancer, hypertension who presents today for hospital follow-up.  She presented to [facility] ED on 09/06/2022 for several day history of bilateral lower extremity edema.  During her stay in the ED her BNP returned at 237 which was similar from 4 years prior.  She was noted to have an oxygen saturation of 90% on 4 L, typically wears 3 L.  Her chest x-ray showed vascular congestion.  She was admitted for CHF treatment and evaluation.  She was provided IV Lasix.  During her hospital stay she underwent venous ultrasound which was negative for DVT.  She was initiated on IV furosemide 40 mg twice daily and her condition improved.  She underwent echocardiogram which showed LVEF of 55 to 60%, indeterminate diastolic dysfunction, mild pulmonary hypertension.  She was also treated with potassium chloride.  She was discharged home on 09/08/2022 with a prescription for spironolactone 25 mg daily and furosemide 40 mg daily.  Her amlodipine, Plaquenil, alendronate were discontinued.  Since her hospital stay she's feeling better. She's been taking spironolactone 25 mg daily and furosemide 40 mg daily, but she did not take her furosemide yesterday.  She does not weigh herself daily. Her feet cramped for a few nights, this has resolved.   She denies increased shortness of breath. Her lower extremity edema has significantly improved. She has an appointment with the CHF clinic scheduled for next week.   She has experienced a left groin rash last week while in the hospital. She describes her as itchy. She's tried petroleum jelly without improvement.   BP Readings from Last 3 Encounters:  09/13/22 136/76  09/08/22 (!) 127/55  08/15/22 128/82     Wt Readings from Last 3 Encounters:  09/13/22 182 lb (82.6 kg)  09/08/22 184 lb  1.4 oz (83.5 kg)  08/15/22 185 lb 9.6 oz (84.2 kg)      Review of Systems  Respiratory:  Positive for shortness of breath.        Denies increased SOB  Cardiovascular:  Negative for chest pain and leg swelling.  Skin:  Positive for rash.  Neurological:  Negative for dizziness.         Past Medical History:  Diagnosis Date   (HFpEF) heart failure with preserved ejection fraction (HCC) 06/28/2011   a.) TTE 06/28/2011: EF 55-60%, mild AR, G1DD; b.) TTE 01/16/2013: EF 55-60%, mild MR; c.) TTE 02/16/2018: EF 60-65%, G1DD; d.) MPI 11/26/2018: EF 56%   Abnormal electrocardiogram 11/12/2018   Acute kidney failure (HCC) 01/12/2017   Adenoma of left adrenal gland    Anemia    Anxiety    Aortic atherosclerosis (HCC)    Bilateral carpal tunnel syndrome    CAD (coronary artery disease)    a.) HR CT chest 10/30/2021: 2 vessel CAD (LAD/RCA)   CAP (community acquired pneumonia)    COPD (chronic obstructive pulmonary disease) (HCC)    COPD exacerbation (HCC) 01/21/2018   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   Diverticulosis    Dyspnea    DUE TO HEAT WITH COPD   Epistaxis 01/09/2017   GERD (gastroesophageal reflux disease)    Herpes zoster 07/08/2015   Hypertension    Hypothyroidism    Iatrogenic hypotension 01/12/2017   Iliac artery stenosis, bilateral (HCC)  Morbid obesity with BMI of 40.0-44.9, adult (HCC)    New onset of headaches    Osteopenia    PMR (polymyalgia rheumatica) (HCC)    Pneumonia 02/13/2018   PONV (postoperative nausea and vomiting)    Pulmonary nodule, left    a.) CT chest 10/30/2021: 1.5 cm medial LUL nodule; b.) PET CT 11/21/2021: hypermetabolic LUL nodule (SUX max 9.3)   S/P carpal tunnel release 08/04/2020   Shingles    Stasis dermatitis    Strain of chest wall 01/20/2011   Tobacco abuse    Vitamin D deficiency     Social History   Socioeconomic History   Marital status: Married    Spouse name: [name]   Number of children: 1   Years of  education: Not on file   Highest education level: Not on file  Occupational History   Occupation: Catering manager as a Paramedic: RETIRED    Comment: retired  Tobacco Use   Smoking status: Former    Current packs/day: 0.00    Average packs/day: 1.5 packs/day for 20.0 years (30.0 ttl pk-yrs)    Types: Cigarettes    Start date: 02/11/1989    Quit date: 02/11/2009    Years since quitting: 13.5   Smokeless tobacco: Never  Vaping Use   Vaping status: Never Used  Substance and Sexual Activity   Alcohol use: No    Alcohol/week: 0.0 standard drinks of alcohol   Drug use: No   Sexual activity: Not Currently  Other Topics Concern   Not on file  Social History Narrative   Lives in [place] with her husband.  Takes care of her two grandchildren, ages 35 and 2, everyday in [place].  Has one son who is 73.   Desires CPR   Would not want prolonged life support if futile   Social Determinants of Health   Financial Resource Strain: Low Risk  (05/07/2022)   Overall Financial Resource Strain (CARDIA)    Difficulty of Paying Living Expenses: Not hard at all  Food Insecurity: No Food Insecurity (09/07/2022)   Hunger Vital Sign    Worried About Running Out of Food in the Last Year: Never true    Ran Out of Food in the Last Year: Never true  Transportation Needs: No Transportation Needs (09/07/2022)   PRAPARE - Administrator, Civil Service (Medical): No    Lack of Transportation (Non-Medical): No  Physical Activity: Insufficiently Active (05/07/2022)   Exercise Vital Sign    Days of Exercise per Week: 3 days    Minutes of Exercise per Session: 30 min  Stress: No Stress Concern Present (05/07/2022)   [employer] of Occupational Health - Occupational Stress Questionnaire    Feeling of Stress : Not at all  Social Connections: Moderately Integrated (05/07/2022)   Social Connection and Isolation Panel [NHANES]    Frequency of Communication with Friends and Family: More  than three times a week    Frequency of Social Gatherings with Friends and Family: More than three times a week    Attends Religious Services: More than 4 times per year    Active Member of [employer] or Organizations: No    Attends Banker Meetings: Never    Marital Status: Married  Catering manager Violence: Not At Risk (09/07/2022)   Humiliation, Afraid, Rape, and Kick questionnaire    Fear of Current or Ex-Partner: No    Emotionally Abused: No    Physically Abused: No  Sexually Abused: No    Past Surgical History:  Procedure Laterality Date   ANTERIOR CERVICAL DECOMP/DISCECTOMY FUSION  06/22/2004   C4-C6   APPENDECTOMY     CARPAL TUNNEL RELEASE Left 08/31/2019   Procedure: CARPAL TUNNEL RELEASE;  Surgeon: Donato Heinz, MD;  Location: [facility] OR[facility];  Service: Orthopedics;  Laterality: Left;   CARPAL TUNNEL RELEASE Right 06/22/2020   Procedure: CARPAL TUNNEL RELEASE;  Surgeon: Donato Heinz, MD;  Location: [facility];  Service: Orthopedics;  Laterality: Right;   CHOLECYSTECTOMY     COLONOSCOPY     ENDOBRONCHIAL ULTRASOUND Left 01/17/2022   Procedure: ENDOBRONCHIAL ULTRASOUND;  Surgeon: Salena Saner, MD;  Location: [facility] OR[facility];  Service: Pulmonary;  Laterality: Left;   ESOPHAGOGASTRODUODENOSCOPY     HEMORRHOID SURGERY     TOTAL ABDOMINAL HYSTERECTOMY W/ BILATERAL SALPINGOOPHORECTOMY     TUBAL LIGATION      Family History  Problem Relation Age of Onset   Heart attack Mother 60   Cancer Father        lung/chest wall    Prostate cancer Father    Diabetes Brother        siblings   Hypertension Sister        x 2   Heart disease Brother    Breast cancer Paternal Grandmother    Heart disease Sister    Diabetes Sister    Tuberculosis Sister    Cirrhosis Brother     Allergies  Allergen Reactions   Epinephrine Anaphylaxis and Shortness Of Breath   Food Rash    MANGO = cause rashes around the mouth LIPS GO NUMB   [employer] Rash    THIS IS MANGO LIPS GO  NUMB   Other Rash    MANGO = cause rashes around the mouth MANGO = cause rashes around the mouth LIPS GO NUMB   Prednisone Anaphylaxis, Hives, Shortness Of Breath, Itching and Swelling    The face swells HAS TAKEN CORTISONE SHOTS WITHOUT ISSUE BEFORE   Ivp Dye [Iodinated Contrast Media] Hives   Lactose Intolerance (Gi) Diarrhea   Pneumococcal 13-Val Conj Vacc Itching and Other (See Comments)    Arm temp elevated at site of injection, red, itching   Prevnar 13 [Pneumococcal 13-Val Conj Vacc] Other (See Comments)    Arm temp elevated at site of injection, red, itching    Current Outpatient Medications on File Prior to Visit  Medication Sig Dispense Refill   atorvastatin (LIPITOR) 10 MG tablet Take 1 tablet (10 mg total) by mouth daily. for cholesterol. 90 tablet 1   Calcium Carb-Cholecalciferol (CALCIUM + VITAMIN D3 PO) Take 1,200 mg by mouth daily.     famotidine (PEPCID) 20 MG tablet TAKE 1 TABLET BY MOUTH AFTER SUPPER 90 tablet 3   furosemide (LASIX) 40 MG tablet Take 1 tablet (40 mg total) by mouth daily. 30 tablet 0   Glycopyrrolate-Formoterol (BEVESPI AEROSPHERE) 9-4.8 MCG/ACT AERO Inhale 2 puffs into the lungs 2 (two) times daily. 1 each 11   levalbuterol (XOPENEX HFA) 45 MCG/ACT inhaler Inhale 2 puffs into the lungs every 4 (four) hours as needed for wheezing or shortness of breath. 1 each 12   levothyroxine (SYNTHROID) 75 MCG tablet TAKE 1 EVERY AM ON AN EMPTY STOMACH WITH WATER ONLY. NO FOOD OR OTHER MEDICATIONS FOR 30 MINUTES. 90 tablet 2   losartan (COZAAR) 100 MG tablet Take 1 tablet (100 mg total) by mouth daily. PLEASE CALL [phone] TO SCHEDULE A YEARLY APPOINTMENT. THANK YOU. 90 tablet 0  Multiple Vitamins-Minerals (PRESERVISION AREDS 2 PO) Take 1 tablet by mouth in the morning and at bedtime.     pantoprazole (PROTONIX) 40 MG tablet Take 1 tablet (40 mg total) by mouth daily. for heartburn. 90 tablet 3   pentoxifylline (TRENTAL) 400 MG CR tablet TAKE 1 TABLET BY MOUTH  3 TIMES DAILY WITH MEALS. 270 tablet 1   spironolactone (ALDACTONE) 25 MG tablet Take 1 tablet (25 mg total) by mouth daily. 30 tablet 0   tacrolimus (PROTOPIC) 0.1 % ointment Apply to aa's lower legs BID PRN. 60 g 3   No current facility-administered medications on file prior to visit.    BP 136/76   Pulse 60   Temp 98.1 F (36.7 C) (Temporal)   Ht 4\' 9"  (1.448 m)   Wt 182 lb (82.6 kg)   SpO2 97% Comment: On 2.5L of O2  BMI 39.38 kg/m  Objective:   Physical Exam Cardiovascular:     Rate and Rhythm: Normal rate and regular rhythm.  Pulmonary:     Effort: Pulmonary effort is normal.     Breath sounds: Normal breath sounds.  Musculoskeletal:     Cervical back: Neck supple.  Skin:    General: Skin is warm and dry.     Findings: Rash present.     Comments: Light red rash to left groin without skin breakdown           Assessment & Plan:  Chronic heart failure with preserved ejection fraction (HFpEF) (HCC) Assessment & Plan: Recent hospitalization for acute on chronic CHF.  Hospital labs, imaging, notes reviewed Appears euvolemic today.  Discussed to use furosemide only as needed.  We discussed the importance of daily weights and to take furosemide for weight gain of greater than 2 pounds in 24 hours or a weight gain of greater than 5 pounds in 1 week.  She will follow-up with CHF clinic next week.  BMP and BNP pending today.  Orders: -     Basic metabolic panel -     Brain natriuretic peptide  Essential hypertension Assessment & Plan: Overall stable.  Remain off of amlodipine.  Continue spironolactone 25 mg daily, losartan 100 mg daily.  BMP pending.         Doreene Nest, NP

## 2022-09-13 NOTE — Assessment & Plan Note (Signed)
Overall stable.  Remain off of amlodipine.  Continue spironolactone 25 mg daily, losartan 100 mg daily.  BMP pending.

## 2022-09-13 NOTE — Patient Instructions (Signed)
Stop by the lab prior to leaving today. I will notify you of your results once received.   Take the furosemide fluid pill only if needed for weight gain of greater than 2 pounds in 24 hours or greater than 5 pound weight gain in 1 week.  Weigh yourself every day.  Follow-up with Inetta Fermo next week is scheduled.  It was a pleasure to see you today!

## 2022-09-13 NOTE — Assessment & Plan Note (Addendum)
Recent hospitalization for acute on chronic CHF.  Hospital labs, imaging, notes reviewed Appears euvolemic today.  Discussed to use furosemide only as needed.  We discussed the importance of daily weights and to take furosemide for weight gain of greater than 2 pounds in 24 hours or a weight gain of greater than 5 pounds in 1 week.  She will follow-up with CHF clinic next week.  BMP and BNP pending today.

## 2022-09-13 NOTE — Transitions of Care (Post Inpatient/ED Visit) (Signed)
   09/13/2022  Name: Teresa Jensen MRN: [id number] DOB: [birth year]  Today's TOC FU Call Status: Today's TOC FU Call Status:: Unsuccessful Call (3rd Attempt) Unsuccessful Call (3rd Attempt) Date: 09/13/22  Attempted to reach the patient regarding the most recent Inpatient/ED visit.  Follow Up Plan: No further outreach attempts will be made at this time. We have been unable to contact the patient.  Jodelle Gross RN, BSN, CCM [facility] RN Care Coordinator/ Transitions of Care Direct Dial: [phone]  Fax: [phone]

## 2022-09-14 ENCOUNTER — Other Ambulatory Visit: Payer: Self-pay | Admitting: Primary Care

## 2022-09-14 DIAGNOSIS — I10 Essential (primary) hypertension: Secondary | ICD-10-CM

## 2022-09-14 LAB — BASIC METABOLIC PANEL
BUN: 43 mg/dL — ABNORMAL HIGH (ref 6–23)
CO2: 32 meq/L (ref 19–32)
Calcium: 9.6 mg/dL (ref 8.4–10.5)
Chloride: 97 meq/L (ref 96–112)
Creatinine, Ser: 1.29 mg/dL — ABNORMAL HIGH (ref 0.40–1.20)
GFR: 40.04 mL/min — ABNORMAL LOW (ref >60.00)
Glucose, Bld: 108 mg/dL — ABNORMAL HIGH (ref 70–99)
Potassium: 4 meq/L (ref 3.5–5.1)
Sodium: 141 meq/L (ref 135–145)

## 2022-09-14 LAB — BRAIN NATRIURETIC PEPTIDE: Pro B Natriuretic peptide (BNP): 110 pg/mL — ABNORMAL HIGH (ref 0.0–100.0)

## 2022-09-17 ENCOUNTER — Ambulatory Visit: Payer: Medicare Other | Attending: Family | Admitting: Family

## 2022-09-17 ENCOUNTER — Other Ambulatory Visit (HOSPITAL_COMMUNITY): Payer: Self-pay

## 2022-09-17 ENCOUNTER — Encounter: Payer: Self-pay | Admitting: Family

## 2022-09-17 VITALS — BP 134/47 | HR 73 | Resp 16 | Wt 181.4 lb

## 2022-09-17 DIAGNOSIS — M353 Polymyalgia rheumatica: Secondary | ICD-10-CM | POA: Diagnosis not present

## 2022-09-17 DIAGNOSIS — E039 Hypothyroidism, unspecified: Secondary | ICD-10-CM | POA: Diagnosis not present

## 2022-09-17 DIAGNOSIS — R0989 Other specified symptoms and signs involving the circulatory and respiratory systems: Secondary | ICD-10-CM | POA: Insufficient documentation

## 2022-09-17 DIAGNOSIS — I10 Essential (primary) hypertension: Secondary | ICD-10-CM | POA: Diagnosis not present

## 2022-09-17 DIAGNOSIS — I5032 Chronic diastolic (congestive) heart failure: Secondary | ICD-10-CM | POA: Insufficient documentation

## 2022-09-17 DIAGNOSIS — I2721 Secondary pulmonary arterial hypertension: Secondary | ICD-10-CM | POA: Diagnosis not present

## 2022-09-17 DIAGNOSIS — I428 Other cardiomyopathies: Secondary | ICD-10-CM | POA: Insufficient documentation

## 2022-09-17 DIAGNOSIS — I2510 Atherosclerotic heart disease of native coronary artery without angina pectoris: Secondary | ICD-10-CM | POA: Insufficient documentation

## 2022-09-17 DIAGNOSIS — J449 Chronic obstructive pulmonary disease, unspecified: Secondary | ICD-10-CM | POA: Diagnosis not present

## 2022-09-17 DIAGNOSIS — I110 Hypertensive heart disease with heart failure: Secondary | ICD-10-CM | POA: Diagnosis not present

## 2022-09-17 DIAGNOSIS — Z87891 Personal history of nicotine dependence: Secondary | ICD-10-CM | POA: Diagnosis not present

## 2022-09-17 DIAGNOSIS — Z79899 Other long term (current) drug therapy: Secondary | ICD-10-CM | POA: Insufficient documentation

## 2022-09-17 DIAGNOSIS — Z86018 Personal history of other benign neoplasm: Secondary | ICD-10-CM | POA: Diagnosis not present

## 2022-09-17 DIAGNOSIS — Z6841 Body Mass Index (BMI) 40.0 and over, adult: Secondary | ICD-10-CM | POA: Insufficient documentation

## 2022-09-17 MED ORDER — FARXIGA 10 MG PO TABS: 10.0000 mg | ORAL_TABLET | Freq: Every day | ORAL | 6 refills | Status: DC

## 2022-09-17 NOTE — Patient Instructions (Addendum)
If you need to take your lasix, take 1/2 tablet if you need it instead of a whole tablet.

## 2022-09-17 NOTE — Progress Notes (Signed)
Advanced Heart Failure Clinic Note   Referring Physician: recent admit PCP: Teresa Nest, NP (last seen 09/24) Cardiologist: Yvonne Kendall, MD (last seen 11/23; to be seen 11/24)  HPI:  [name] is a 77 y/o female with a history of CAD, COPD, lung cancer, HTN, adenoma of left adrenal gland, lung cancer, anemia, anxiety, diverticulosis, GERD, hypothyroidism, bilateral iliac artery stenosis, left pulmonary nodule, polymyalgia rheumatica, tobacco use and chronic heart failure.   Admitted 09/06/22 due to bilateral leg edema, weight gain, shortness of breath with exertion, orthopnea. Chest x-ray showed vascular congestion, patient has mild elevation of BNP.  atient also had worsening hypoxemia on 3 L oxygen, was placed on 6 L oxygen. Given IV lasix with improvement and oxygen weaned back to baseline 3L. Echocardiogram showed ejection fraction 55 to 60% indeterminate diastolic dysfunction. Mild pulmonary artery hypertension. Hypokalemia corrected. Venous ultrasound was negative for DVT.   Echo 06/28/11: EF 55-60% with Grade I DD and mild AR Echo 01/15/13: EF 55-60% with mild AR Echo 02/16/18: EF 60-65%  Echo 09/07/22: EF 55-60% with mildly elevated PA pressure, mild LAE and mild MR  She presents today for her initial HF Clinic visit with a chief complaint of minimal SOB with moderate exertion. Chronic in nature. Has associated fatigue, intermittent dizziness and intermittent leg cramping along with this. Denies chest pain, palpitations, abdominal distention, pedal edema or difficulty sleeping.   Takes furosemide PRN based on weight & she says that she took it yesterday. Does intermittently get leg cramps after she takes the lasix.     Wearing oxygen at 3L at bedtime and PRN during the day. Checks pulse ox at home to keep oxygen level >90%.   Drinking 2 bottles of water (32 oz) daily with 2 cups of coffee and small gingerale daily. Was drinking much more than that before she needed up in the  hospital a couple of weeks ago.   Review of Systems: [y] = yes, [ ]  = no   General: Weight gain [ ] ; Weight loss [ ] ; Anorexia [ ] ; Fatigue Cove.Etienne ]; Fever [ ] ; Chills [ ] ; Weakness [ ]   Cardiac: Chest pain/pressure [ ] ; Resting SOB [ ] ; Exertional SOB Cove.Etienne ]; Orthopnea [ ] ; Pedal Edema [ ] ; Palpitations [ ] ; Syncope [ ] ; Presyncope [ ] ; Paroxysmal nocturnal dyspnea[ ]   Pulmonary: Cough [ ] ; Wheezing[ ] ; Hemoptysis[ ] ; Sputum [ ] ; Snoring [ ]   GI: Vomiting[ ] ; Dysphagia[ ] ; Melena[ ] ; Hematochezia [ ] ; Heartburn[ ] ; Abdominal pain [ ] ; Constipation [ ] ; Diarrhea [ ] ; BRBPR [ ]   GU: Hematuria[ ] ; Dysuria [ ] ; Nocturia[ ]   Vascular: Pain in legs with walking [ ] ; Pain in feet with lying flat [ ] ; Non-healing sores [ ] ; Stroke [ ] ; TIA [ ] ; Slurred speech [ ] ; leg cramps [y] Neuro: Headaches[ ] ; Vertigo[ ] ; Seizures[ ] ; Paresthesias[ ] ;Blurred vision [ ] ; Diplopia [ ] ; Vision changes [ ] ; Dizziness [y] Ortho/Skin: Arthritis [ ] ; Joint pain [ ] ; Muscle pain [ ] ; Joint swelling [ ] ; Back Pain [ ] ; Rash [ ]   Psych: Depression[ ] ; Anxiety[ ]   Heme: Bleeding problems [ ] ; Clotting disorders [ ] ; Anemia [ ]   Endocrine: Diabetes [ ] ; Thyroid dysfunction[ ]    Past Medical History:  Diagnosis Date   (HFpEF) heart failure with preserved ejection fraction (HCC) 06/28/2011   a.) TTE 06/28/2011: EF 55-60%, mild AR, G1DD; b.) TTE 01/16/2013: EF 55-60%, mild MR; c.) TTE 02/16/2018: EF 60-65%, G1DD; d.) MPI 11/26/2018:  EF 56%   Abnormal electrocardiogram 11/12/2018   Acute kidney failure (HCC) 01/12/2017   Adenoma of left adrenal gland    Anemia    Anxiety    Aortic atherosclerosis (HCC)    Bilateral carpal tunnel syndrome    CAD (coronary artery disease)    a.) HR CT chest 10/30/2021: 2 vessel CAD (LAD/RCA)   CAP (community acquired pneumonia)    COPD (chronic obstructive pulmonary disease) (HCC)    COPD exacerbation (HCC) 01/21/2018   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   Diverticulosis    Dyspnea     DUE TO HEAT WITH COPD   Epistaxis 01/09/2017   GERD (gastroesophageal reflux disease)    Herpes zoster 07/08/2015   Hypertension    Hypothyroidism    Iatrogenic hypotension 01/12/2017   Iliac artery stenosis, bilateral (HCC)    Morbid obesity with BMI of 40.0-44.9, adult (HCC)    New onset of headaches    Osteopenia    PMR (polymyalgia rheumatica) (HCC)    Pneumonia 02/13/2018   PONV (postoperative nausea and vomiting)    Pulmonary nodule, left    a.) CT chest 10/30/2021: 1.5 cm medial LUL nodule; b.) PET CT 11/21/2021: hypermetabolic LUL nodule (SUX max 9.3)   S/P carpal tunnel release 08/04/2020   Shingles    Stasis dermatitis    Strain of chest wall 01/20/2011   Tobacco abuse    Vitamin D deficiency     Current Outpatient Medications  Medication Sig Dispense Refill   atorvastatin (LIPITOR) 10 MG tablet Take 1 tablet (10 mg total) by mouth daily. for cholesterol. 90 tablet 1   Calcium Carb-Cholecalciferol (CALCIUM + VITAMIN D3 PO) Take 1,200 mg by mouth daily.     famotidine (PEPCID) 20 MG tablet TAKE 1 TABLET BY MOUTH AFTER SUPPER 90 tablet 3   furosemide (LASIX) 40 MG tablet Take 1 tablet (40 mg total) by mouth daily. 30 tablet 0   Glycopyrrolate-Formoterol (BEVESPI AEROSPHERE) 9-4.8 MCG/ACT AERO Inhale 2 puffs into the lungs 2 (two) times daily. 1 each 11   levalbuterol (XOPENEX HFA) 45 MCG/ACT inhaler Inhale 2 puffs into the lungs every 4 (four) hours as needed for wheezing or shortness of breath. 1 each 12   levothyroxine (SYNTHROID) 75 MCG tablet TAKE 1 EVERY AM ON AN EMPTY STOMACH WITH WATER ONLY. NO FOOD OR OTHER MEDICATIONS FOR 30 MINUTES. 90 tablet 2   losartan (COZAAR) 100 MG tablet Take 1 tablet (100 mg total) by mouth daily. PLEASE CALL [phone] TO SCHEDULE A YEARLY APPOINTMENT. THANK YOU. 90 tablet 0   Multiple Vitamins-Minerals (PRESERVISION AREDS 2 PO) Take 1 tablet by mouth in the morning and at bedtime.     pantoprazole (PROTONIX) 40 MG tablet Take 1  tablet (40 mg total) by mouth daily. for heartburn. 90 tablet 3   pentoxifylline (TRENTAL) 400 MG CR tablet TAKE 1 TABLET BY MOUTH 3 TIMES DAILY WITH MEALS. 270 tablet 1   spironolactone (ALDACTONE) 25 MG tablet Take 1 tablet (25 mg total) by mouth daily. 30 tablet 0   tacrolimus (PROTOPIC) 0.1 % ointment Apply to aa's lower legs BID PRN. 60 g 3   No current facility-administered medications for this visit.    Allergies  Allergen Reactions   Epinephrine Anaphylaxis and Shortness Of Breath   Food Rash    MANGO = cause rashes around the mouth LIPS GO NUMB   Mang[employer] Rash    THIS IS MANGO LIPS GO NUMB   Other Rash  MANGO = cause rashes around the mouth MANGO = cause rashes around the mouth LIPS GO NUMB   Prednisone Anaphylaxis, Hives, Shortness Of Breath, Itching and Swelling    The face swells HAS TAKEN CORTISONE SHOTS WITHOUT ISSUE BEFORE   Ivp Dye [Iodinated Contrast Media] Hives   Lactose Intolerance (Gi) Diarrhea   Pneumococcal 13-Val Conj Vacc Itching and Other (See Comments)    Arm temp elevated at site of injection, red, itching   Prevnar 13 [Pneumococcal 13-Val Conj Vacc] Other (See Comments)    Arm temp elevated at site of injection, red, itching      Social History   Socioeconomic History   Marital status: Married    Spouse name: [name]   Number of children: 1   Years of education: Not on file   Highest education level: Not on file  Occupational History   Occupation: Catering manager as a Paramedic: RETIRED    Comment: retired  Tobacco Use   Smoking status: Former    Current packs/day: 0.00    Average packs/day: 1.5 packs/day for 20.0 years (30.0 ttl pk-yrs)    Types: Cigarettes    Start date: 02/11/1989    Quit date: 02/11/2009    Years since quitting: 13.6   Smokeless tobacco: Never  Vaping Use   Vaping status: Never Used  Substance and Sexual Activity   Alcohol use: No    Alcohol/week: 0.0 standard drinks of alcohol   Drug  use: No   Sexual activity: Not Currently  Other Topics Concern   Not on file  Social History Narrative   Lives in [place] with her husband.  Takes care of her two grandchildren, ages 53 and 2, everyday in [place].  Has one son who is 76.   Desires CPR   Would not want prolonged life support if futile   Social Determinants of Health   Financial Resource Strain: Low Risk  (05/07/2022)   Overall Financial Resource Strain (CARDIA)    Difficulty of Paying Living Expenses: Not hard at all  Food Insecurity: No Food Insecurity (09/07/2022)   Hunger Vital Sign    Worried About Running Out of Food in the Last Year: Never true    Ran Out of Food in the Last Year: Never true  Transportation Needs: No Transportation Needs (09/07/2022)   PRAPARE - Administrator, Civil Service (Medical): No    Lack of Transportation (Non-Medical): No  Physical Activity: Insufficiently Active (05/07/2022)   Exercise Vital Sign    Days of Exercise per Week: 3 days    Minutes of Exercise per Session: 30 min  Stress: No Stress Concern Present (05/07/2022)   [employer] of Occupational Health - Occupational Stress Questionnaire    Feeling of Stress : Not at all  Social Connections: Moderately Integrated (05/07/2022)   Social Connection and Isolation Panel [NHANES]    Frequency of Communication with Friends and Family: More than three times a week    Frequency of Social Gatherings with Friends and Family: More than three times a week    Attends Religious Services: More than 4 times per year    Active Member of [employer] or Organizations: No    Attends Banker Meetings: Never    Marital Status: Married  Catering manager Violence: Not At Risk (09/07/2022)   Humiliation, Afraid, Rape, and Kick questionnaire    Fear of Current or Ex-Partner: No    Emotionally Abused: No    Physically Abused:  No    Sexually Abused: No      Family History  Problem Relation Age of Onset   Heart attack Mother 30    Cancer Father        lung/chest wall    Prostate cancer Father    Diabetes Brother        siblings   Hypertension Sister        x 2   Heart disease Brother    Breast cancer Paternal Grandmother    Heart disease Sister    Diabetes Sister    Tuberculosis Sister    Cirrhosis Brother    Vitals:   09/17/22 0918  BP: (!) 134/47  Pulse: 73  Resp: 16  SpO2: 94%  Weight: 181 lb 7 oz (82.3 kg)   Wt Readings from Last 3 Encounters:  09/17/22 181 lb 7 oz (82.3 kg)  09/13/22 182 lb (82.6 kg)  09/08/22 184 lb 1.4 oz (83.5 kg)   Lab Results  Component Value Date   CREATININE 1.29 (H) 09/13/2022   CREATININE 0.91 09/08/2022   CREATININE 0.82 09/07/2022   PHYSICAL EXAM: General:  Well appearing. No respiratory difficulty HEENT: normal Neck: supple. no JVD. No lymphadenopathy or thyromegaly appreciated. Cor: PMI nondisplaced. Regular rate & rhythm. No rubs, gallops or murmurs. Lungs: clear Abdomen: soft, nontender, nondistended. No hepatosplenomegaly. No bruits or masses.  Extremities: no cyanosis, clubbing, rash, edema Neuro: alert & oriented x 3, cranial nerves grossly intact. moves all 4 extremities w/o difficulty. Affect pleasant.  ECG: 09/10/22 was NSR with PVC   ASSESSMENT & PLAN:  1: NICM with preserved ejection fraction- - suspect due to COPD  - NYHA class II - euvolemic - weighing daily; reviewed to call for an overnight weight gain of > 2 pounds or a weekly weight gain of > 5 pounds - Echo 06/28/11: EF 55-60% with Grade I DD and mild AR - Echo 01/15/13: EF 55-60% with mild AR - Echo 02/16/18: EF 60-65%  - Echo 09/07/22: EF 55-60% with mildly elevated PA pressure, mild LAE and mild MR - saw cardiology Fransico Michael) 11/23; returns 11/24 - continue losartan 100mg  daily - continue spironolactone 25mg  daily - continue furosemide 40mg  PRN although if she feels like it's too much, she can take 20mg  PRN to avoid leg cramps - begin farxiga 10mg  daily; 30 day voucher provided and will  ask pharmacy advocate to see if she is eligible for grant assistance with this - BMP next visit - Mg 09/08/22 was 1.7 - BNP 09/06/22 was 237.0  2: HTN- - BP 134/47 - saw PCP Chestine Spore) - BMP 09/13/22 showed sodium 141, potassium 4.0, creatinine 1.29 & GFR 40.04  3: Stage II COPD- - saw pulmonology Jayme Cloud) 08/24 - wearing oxygen at 3L at bedtime and PRN during the day - quit smoking ~ 2009; smoked 1.5 ppd at her peak  Return in 3 weeks, sooner if needed.  Delma Freeze, FNP 09/17/22

## 2022-09-18 ENCOUNTER — Ambulatory Visit: Payer: No Typology Code available for payment source | Admitting: Primary Care

## 2022-09-19 ENCOUNTER — Telehealth: Payer: Self-pay

## 2022-09-19 ENCOUNTER — Other Ambulatory Visit (HOSPITAL_COMMUNITY): Payer: Self-pay

## 2022-09-19 ENCOUNTER — Encounter (HOSPITAL_COMMUNITY): Payer: Self-pay

## 2022-09-19 NOTE — Telephone Encounter (Signed)
Advanced Heart Failure Patient Advocate Encounter  The patient was approved for a [employer] grant that will help cover the cost of Farxiga, Losartan, Spironolactone.  Total amount awarded, $10,000.  Effective: 08/20/2022 - 08/19/2023.  BIN [id number] PCN [id number] [employer] [id number] ID [id number]  Pharmacy provided with approval and processing information. Patient informed via [employer].  Burnell Blanks, CPhT Rx Patient Advocate Phone: [phone]

## 2022-09-19 NOTE — Telephone Encounter (Signed)
Wrong context error

## 2022-09-21 ENCOUNTER — Ambulatory Visit
Admission: RE | Admit: 2022-09-21 | Discharge: 2022-09-21 | Disposition: A | Discharge status: Home / Self Care | Payer: Medicare Other | Source: Ambulatory Visit | Attending: Primary Care

## 2022-09-21 ENCOUNTER — Other Ambulatory Visit: Payer: Medicare Other

## 2022-09-21 ENCOUNTER — Other Ambulatory Visit (INDEPENDENT_AMBULATORY_CARE_PROVIDER_SITE_OTHER): Payer: Medicare Other

## 2022-09-21 DIAGNOSIS — R928 Other abnormal and inconclusive findings on diagnostic imaging of breast: Secondary | ICD-10-CM | POA: Diagnosis present

## 2022-09-21 DIAGNOSIS — I10 Essential (primary) hypertension: Secondary | ICD-10-CM | POA: Diagnosis not present

## 2022-09-21 LAB — BASIC METABOLIC PANEL
BUN: 27 mg/dL — ABNORMAL HIGH (ref 6–23)
CO2: 27 mEq/L (ref 19–32)
Calcium: 9.9 mg/dL (ref 8.4–10.5)
Chloride: 98 mEq/L (ref 96–112)
Creatinine, Ser: 1.06 mg/dL (ref 0.40–1.20)
GFR: 50.67 mL/min — ABNORMAL LOW (ref >60.00)
Glucose, Bld: 114 mg/dL — ABNORMAL HIGH (ref 70–99)
Potassium: 4.4 mEq/L (ref 3.5–5.1)
Sodium: 137 mEq/L (ref 135–145)

## 2022-09-30 ENCOUNTER — Encounter: Payer: Self-pay | Admitting: Pulmonary Disease

## 2022-09-30 DIAGNOSIS — J8410 Pulmonary fibrosis, unspecified: Secondary | ICD-10-CM

## 2022-09-30 DIAGNOSIS — J9611 Chronic respiratory failure with hypoxia: Secondary | ICD-10-CM

## 2022-10-01 ENCOUNTER — Encounter: Payer: Self-pay | Admitting: Pulmonary Disease

## 2022-10-01 NOTE — Telephone Encounter (Signed)
Yes, may place order for humidifier bottle for the oxygen concentrator.

## 2022-10-01 NOTE — Telephone Encounter (Signed)
Ok to place an order for a humidifier?

## 2022-10-05 NOTE — Progress Notes (Unsigned)
Advanced Heart Failure Clinic Note   Referring Physician: recent admit PCP: Teresa Nest, NP (last seen 09/24) Cardiologist: Teresa Kendall, MD (last seen 11/23; to be seen 11/24)  HPI:  [name] is a 77 y/o female with a history of CAD, COPD, lung cancer, HTN, adenoma of left adrenal gland, lung cancer, anemia, anxiety, diverticulosis, GERD, hypothyroidism, bilateral iliac artery stenosis, left pulmonary nodule, polymyalgia rheumatica, tobacco use and chronic heart failure.   Admitted 09/06/22 due to bilateral leg edema, weight gain, shortness of breath with exertion, orthopnea. Chest x-ray showed vascular congestion, patient has mild elevation of BNP.  atient also had worsening hypoxemia on 3 L oxygen, was placed on 6 L oxygen. Given IV lasix with improvement and oxygen weaned back to baseline 3L. Echocardiogram showed ejection fraction 55 to 60% indeterminate diastolic dysfunction. Mild pulmonary artery hypertension. Hypokalemia corrected. Venous ultrasound was negative for DVT.   Echo 06/28/11: EF 55-60% with Grade I DD and mild AR Echo 01/15/13: EF 55-60% with mild AR Echo 02/16/18: EF 60-65%  Echo 09/07/22: EF 55-60% with mildly elevated PA pressure, mild LAE and mild MR  She presents today for her initial HF Clinic visit with a chief complaint of minimal SOB with moderate exertion. Chronic in nature. Has associated fatigue, intermittent dizziness and intermittent leg cramping along with this. Denies chest pain, palpitations, abdominal distention, pedal edema or difficulty sleeping.   Takes furosemide PRN based on weight & she says that she took it yesterday. Does intermittently get leg cramps after she takes the lasix.     Wearing oxygen at 3L at bedtime and PRN during the day. Checks pulse ox at home to keep oxygen level >90%.   Drinking 2 bottles of water (32 oz) daily with 2 cups of coffee and small gingerale daily. Was drinking much more than that before she needed up in the  hospital a couple of weeks ago.     ROS: All systems negative except as listed in HPI, PMH and Problem List.  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  Takes care of her two grandchildren, ages 78 and 2, everyday in [place].  Has one son who is 88.   Desires CPR   Would not want prolonged life support if futile   Social Determinants of Health   Financial Resource Strain: Low Risk  (05/07/2022)   Overall Financial Resource Strain (CARDIA)    Difficulty of Paying Living Expenses: Not hard at all  Food Insecurity: No  Food Insecurity (09/07/2022)   Hunger Vital Sign    Worried About Running Out of Food in the Last Year: Never true    Ran Out of Food in the Last Year: Never true  Transportation Needs: No Transportation Needs (09/07/2022)   PRAPARE - Administrator, Civil Service (Medical): No    Lack of Transportation (Non-Medical): No  Physical Activity: Insufficiently Active (05/07/2022)   Exercise Vital Sign    Days of Exercise per Week: 3 days    Minutes of Exercise per Session: 30 min  Stress: No Stress Concern Present (05/07/2022)   Teresa Jensen of  Occupational Health - Occupational Stress Questionnaire    Feeling of Stress : Not at all  Social Connections: Moderately Integrated (05/07/2022)   Social Connection and Isolation Panel [NHANES]    Frequency of Communication with Friends and Family: More than three times a week    Frequency of Social Gatherings with Friends and Family: More than three times a week    Attends Religious Services: More than 4 times per year    Active Member of Clubs or Organizations: No    Attends Banker Meetings: Never    Marital Status: Married  Catering manager Violence: Not At Risk (09/07/2022)   Humiliation, Afraid, Rape, and Kick questionnaire    Fear of Current or Ex-Partner: No    Emotionally Abused: No    Physically Abused: No    Sexually Abused: No    FH:  Family History  Problem Relation Age of Onset   Heart attack Mother 24   Cancer Father        lung/chest wall    Prostate cancer Father    Diabetes Brother        siblings   Hypertension Sister        x 2   Heart disease Brother    Breast cancer Paternal Grandmother    Heart disease Sister    Diabetes Sister    Tuberculosis Sister    Cirrhosis Brother     Past Medical History:  Diagnosis Date   (HFpEF) heart failure with preserved ejection fraction (HCC) 06/28/2011   a.) TTE 06/28/2011: EF 55-60%, mild AR, G1DD; b.) TTE 01/16/2013: EF 55-60%, mild MR; c.) TTE 02/16/2018: EF 60-65%, G1DD; d.) MPI 11/26/2018: EF 56%   Abnormal electrocardiogram 11/12/2018   Acute kidney failure (HCC) 01/12/2017   Adenoma of left adrenal gland    Anemia    Anxiety    Aortic atherosclerosis (HCC)    Bilateral carpal tunnel syndrome    CAD (coronary artery disease)    a.) HR CT chest 10/30/2021: 2 vessel CAD (LAD/RCA)   CAP (community acquired pneumonia)    COPD (chronic obstructive pulmonary disease) (HCC)    COPD exacerbation (HCC) 01/21/2018   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   Diverticulosis    Dyspnea    DUE TO HEAT  WITH COPD   Epistaxis 01/09/2017   GERD (gastroesophageal reflux disease)    Herpes zoster 07/08/2015   Hypertension    Hypothyroidism    Iatrogenic hypotension 01/12/2017   Iliac artery stenosis, bilateral (HCC)    Morbid obesity with BMI of 40.0-44.9, adult (HCC)    New onset of headaches    Osteopenia    PMR (polymyalgia rheumatica) (HCC)    Pneumonia 02/13/2018   PONV (postoperative nausea and vomiting)    Pulmonary nodule, left    a.) CT chest 10/30/2021: 1.5 cm medial LUL nodule; b.) PET CT 11/21/2021:  hypermetabolic LUL nodule (SUX max 9.3)   S/P carpal tunnel release 08/04/2020   Shingles    Stasis dermatitis    Strain of chest wall 01/20/2011   Tobacco abuse    Vitamin D deficiency     Current Outpatient Medications  Medication Sig Dispense Refill   atorvastatin (LIPITOR) 10 MG tablet Take 1 tablet (10 mg total) by mouth daily. for cholesterol. 90 tablet 1   Calcium Carb-Cholecalciferol (CALCIUM + VITAMIN D3 PO) Take 1,200 mg by mouth daily.     famotidine (PEPCID) 20 MG tablet TAKE 1 TABLET BY MOUTH AFTER SUPPER 90 tablet 3   FARXIGA 10 MG TABS tablet Take 1 tablet (10 mg total) by mouth daily before breakfast. 30 tablet 6   furosemide (LASIX) 40 MG tablet Take 1 tablet (40 mg total) by mouth daily. 30 tablet 0   Glycopyrrolate-Formoterol (BEVESPI AEROSPHERE) 9-4.8 MCG/ACT AERO Inhale 2 puffs into the lungs 2 (two) times daily. 1 each 11   levalbuterol (XOPENEX HFA) 45 MCG/ACT inhaler Inhale 2 puffs into the lungs every 4 (four) hours as needed for wheezing or shortness of breath. 1 each 12   levothyroxine (SYNTHROID) 75 MCG tablet TAKE 1 EVERY AM ON AN EMPTY STOMACH WITH WATER ONLY. NO FOOD OR OTHER MEDICATIONS FOR 30 MINUTES. 90 tablet 2   losartan (COZAAR) 100 MG tablet Take 1 tablet (100 mg total) by mouth daily. PLEASE CALL [phone] TO SCHEDULE A YEARLY APPOINTMENT. THANK YOU. 90 tablet 0   Multiple Vitamins-Minerals (PRESERVISION AREDS 2 PO) Take 1 tablet by  mouth in the morning and at bedtime.     pantoprazole (PROTONIX) 40 MG tablet Take 1 tablet (40 mg total) by mouth daily. for heartburn. 90 tablet 3   pentoxifylline (TRENTAL) 400 MG CR tablet TAKE 1 TABLET BY MOUTH 3 TIMES DAILY WITH MEALS. 270 tablet 1   spironolactone (ALDACTONE) 25 MG tablet Take 1 tablet (25 mg total) by mouth daily. 30 tablet 0   tacrolimus (PROTOPIC) 0.1 % ointment Apply to aa's lower legs BID PRN. 60 g 3   No current facility-administered medications for this visit.    There were no vitals filed for this visit.  PHYSICAL EXAM:  General:  Well appearing. No resp difficulty HEENT: normal Neck: supple. JVP flat. Carotids 2+ bilaterally; no bruits. No lymphadenopathy or thryomegaly appreciated. Cor: PMI normal. Regular rate & rhythm. No rubs, gallops or murmurs. Lungs: clear Abdomen: soft, nontender, nondistended. No hepatosplenomegaly. No bruits or masses. Good bowel sounds. Extremities: no cyanosis, clubbing, rash, edema Neuro: alert & orientedx3, cranial nerves grossly intact. Moves all 4 extremities w/o difficulty. Affect pleasant.   ECG:   ASSESSMENT & PLAN:  1: NICM with preserved ejection fraction- - suspect due to COPD  - NYHA class II - euvolemic - weighing daily; reviewed to call for an overnight weight gain of > 2 pounds or a weekly weight gain of > 5 pounds - Echo 06/28/11: EF 55-60% with Grade I DD and mild AR - Echo 01/15/13: EF 55-60% with mild AR - Echo 02/16/18: EF 60-65%  - Echo 09/07/22: EF 55-60% with mildly elevated PA pressure, mild LAE and mild MR - saw cardiology Teresa Jensen) 11/23; returns 11/24 - continue losartan 100mg  daily - continue spironolactone 25mg  daily - continue furosemide 40mg  PRN although if she feels like it's too much, she can take 20mg  PRN to avoid leg cramps - begin farxiga 10mg  daily; 30 day voucher provided and will ask pharmacy advocate to see if she is  eligible for grant assistance with this - BMP next visit - Mg  09/08/22 was 1.7 - BNP 09/06/22 was 237.0  2: HTN- - BP 134/47 - saw PCP Teresa Jensen) - BMP 09/13/22 showed sodium 141, potassium 4.0, creatinine 1.29 & GFR 40.04  3: Stage II COPD- - saw pulmonology Teresa Jensen) 08/24 - wearing oxygen at 3L at bedtime and PRN during the day - quit smoking ~ 2009; smoked 1.5 ppd at her peak  Return in 3 weeks, sooner if needed.  Delma Freeze, FNP 09/17/22

## 2022-10-08 ENCOUNTER — Encounter: Payer: Medicare Other | Admitting: Family

## 2022-10-08 ENCOUNTER — Ambulatory Visit: Payer: Medicare Other | Attending: Family | Admitting: Family

## 2022-10-08 ENCOUNTER — Ambulatory Visit: Payer: Medicare Other | Admitting: Physician Assistant

## 2022-10-08 ENCOUNTER — Telehealth: Payer: Self-pay

## 2022-10-08 ENCOUNTER — Encounter: Payer: Self-pay | Admitting: Family

## 2022-10-08 VITALS — BP 130/30 | HR 69 | Wt 174.0 lb

## 2022-10-08 DIAGNOSIS — Z86018 Personal history of other benign neoplasm: Secondary | ICD-10-CM | POA: Diagnosis not present

## 2022-10-08 DIAGNOSIS — J449 Chronic obstructive pulmonary disease, unspecified: Secondary | ICD-10-CM | POA: Diagnosis not present

## 2022-10-08 DIAGNOSIS — Z87891 Personal history of nicotine dependence: Secondary | ICD-10-CM | POA: Insufficient documentation

## 2022-10-08 DIAGNOSIS — E039 Hypothyroidism, unspecified: Secondary | ICD-10-CM | POA: Insufficient documentation

## 2022-10-08 DIAGNOSIS — I110 Hypertensive heart disease with heart failure: Secondary | ICD-10-CM | POA: Diagnosis present

## 2022-10-08 DIAGNOSIS — Z860101 Personal history of adenomatous and serrated colon polyps: Secondary | ICD-10-CM | POA: Diagnosis not present

## 2022-10-08 DIAGNOSIS — Z9981 Dependence on supplemental oxygen: Secondary | ICD-10-CM | POA: Insufficient documentation

## 2022-10-08 DIAGNOSIS — B379 Candidiasis, unspecified: Secondary | ICD-10-CM | POA: Insufficient documentation

## 2022-10-08 DIAGNOSIS — I5032 Chronic diastolic (congestive) heart failure: Secondary | ICD-10-CM | POA: Insufficient documentation

## 2022-10-08 DIAGNOSIS — M353 Polymyalgia rheumatica: Secondary | ICD-10-CM | POA: Insufficient documentation

## 2022-10-08 DIAGNOSIS — I428 Other cardiomyopathies: Secondary | ICD-10-CM | POA: Insufficient documentation

## 2022-10-08 DIAGNOSIS — I10 Essential (primary) hypertension: Secondary | ICD-10-CM | POA: Diagnosis not present

## 2022-10-08 DIAGNOSIS — Z79899 Other long term (current) drug therapy: Secondary | ICD-10-CM | POA: Insufficient documentation

## 2022-10-08 DIAGNOSIS — B3731 Acute candidiasis of vulva and vagina: Secondary | ICD-10-CM | POA: Diagnosis not present

## 2022-10-08 DIAGNOSIS — I2510 Atherosclerotic heart disease of native coronary artery without angina pectoris: Secondary | ICD-10-CM | POA: Diagnosis not present

## 2022-10-08 MED ORDER — FLUCONAZOLE 150 MG PO TABS: 150.0000 mg | ORAL_TABLET | Freq: Every day | ORAL | 0 refills | Status: DC

## 2022-10-08 MED ORDER — BEVESPI AEROSPHERE 9-4.8 MCG/ACT IN AERO: 2.0000 | INHALATION_SPRAY | Freq: Two times a day (BID) | RESPIRATORY_TRACT | 3 refills | Status: DC

## 2022-10-08 MED ORDER — SPIRONOLACTONE 25 MG PO TABS: 25.0000 mg | ORAL_TABLET | Freq: Every day | ORAL | 5 refills | Status: DC

## 2022-10-08 NOTE — Telephone Encounter (Addendum)
Forms have been completed and faxed to [facility] [employer]. She would like to come by and pickup copy. Copy placed up front for pickup. Patient is aware and voiced her understanding.  Nothing further needed.

## 2022-10-08 NOTE — Telephone Encounter (Signed)
Prescription has been sent in. Nothing further needed.  

## 2022-10-08 NOTE — Telephone Encounter (Signed)
-----   Message from Sarina Ser sent at 10/08/2022  4:30 PM EDT ----- Regarding: FW: Patient Assistance Refill needed We can send prescription for Bevespi for Ms. Schechter to the pharmacy below.  90-day supply 1 year refill. ----- Message ----- From: Evorn Gong, CPhT Sent: 10/08/2022   3:49 PM EDT To: Salena Saner, MD Subject: Patient Assistance Refill needed               Hi Dr. Jayme Cloud,   Patient is in need of a new prescription for [facility] sent to [employer] patient assistance company's pharmacy ([facility]) so she can continue to get Bevespi from them in 2025. She is now enrolled for the remainder of 2024 and the duration of 2025.  Below is the pharmacy information needed to escribe them a prescription. They dispense in 90 day increments.  [facility] [address] [place], [place] [address] Phone: [phone] Fax: [phone] NPI: [id number] NCPDP: [id number]  Thanks, Pattricia Boss, CPhT [place]  Office: [phone] Fax: [phone] Email: [email or web address]

## 2022-10-08 NOTE — Patient Instructions (Signed)
Go DOWN to LOWER LEVEL (LL) to have your blood work completed inside of [employer] office.

## 2022-10-08 NOTE — Telephone Encounter (Signed)
Patient dropped off [employer] forms to be filled out. I have placed the forms in your folder.

## 2022-10-08 NOTE — Telephone Encounter (Signed)
Done

## 2022-10-09 ENCOUNTER — Telehealth: Payer: Self-pay

## 2022-10-09 DIAGNOSIS — I5032 Chronic diastolic (congestive) heart failure: Secondary | ICD-10-CM

## 2022-10-09 LAB — BASIC METABOLIC PANEL
BUN/Creatinine Ratio: 25 (ref 12–28)
BUN: 26 mg/dL (ref 8–27)
CO2: 21 mmol/L (ref 20–29)
Calcium: 9.8 mg/dL (ref 8.7–10.3)
Chloride: 101 mmol/L (ref 96–106)
Creatinine, Ser: 1.03 mg/dL — ABNORMAL HIGH (ref 0.57–1.00)
Glucose: 99 mg/dL (ref 70–99)
Potassium: 5.3 mmol/L — ABNORMAL HIGH (ref 3.5–5.2)
Sodium: 138 mmol/L (ref 134–144)
eGFR: 56 mL/min/{1.73_m2} — ABNORMAL LOW (ref >59)

## 2022-10-09 MED ORDER — SPIRONOLACTONE 25 MG PO TABS: 12.5000 mg | ORAL_TABLET | Freq: Every day | ORAL | 3 refills | Status: DC

## 2022-10-09 NOTE — Telephone Encounter (Addendum)
Spoke with patient regarding recommendations.  Lab results. Take Spironolactone 12.5 mg (0.5 tablet) daily.  Patient to come and have lab repeat in 10-14 days. Pt stated to come Friday 10/19/22 before 4:30 pm. Pt aware, agreeable, and verbalized understanding. Lab order placed.   ----- Message from Delma Freeze sent at 10/09/2022  8:01 AM EDT ----- Kidney function looks good. Potassium is a little high so decrease the spironolactone to 12.5mg  daily. This will be 1/2 tablet daily. Recheck BMET in 10-14 days

## 2022-10-16 ENCOUNTER — Ambulatory Visit: Payer: Medicare Other

## 2022-10-16 ENCOUNTER — Encounter: Payer: Self-pay | Admitting: Family

## 2022-10-17 ENCOUNTER — Other Ambulatory Visit
Admission: RE | Admit: 2022-10-17 | Discharge: 2022-10-17 | Disposition: A | Discharge status: Home / Self Care | Payer: Medicare Other | Attending: Family | Admitting: Family

## 2022-10-17 DIAGNOSIS — I5032 Chronic diastolic (congestive) heart failure: Secondary | ICD-10-CM | POA: Diagnosis present

## 2022-10-17 LAB — BASIC METABOLIC PANEL
Anion gap: 11 (ref 5–15)
BUN: 26 mg/dL — ABNORMAL HIGH (ref 8–23)
CO2: 24 mmol/L (ref 22–32)
Calcium: 9.3 mg/dL (ref 8.9–10.3)
Chloride: 103 mmol/L (ref 98–111)
Creatinine, Ser: 0.98 mg/dL (ref 0.44–1.00)
GFR, Estimated: 59 mL/min — ABNORMAL LOW (ref >60)
Glucose, Bld: 112 mg/dL — ABNORMAL HIGH (ref 70–99)
Potassium: 4.1 mmol/L (ref 3.5–5.1)
Sodium: 138 mmol/L (ref 135–145)

## 2022-10-19 ENCOUNTER — Encounter: Payer: Self-pay | Admitting: Family

## 2022-10-19 ENCOUNTER — Encounter: Payer: Self-pay | Admitting: Primary Care

## 2022-10-19 ENCOUNTER — Ambulatory Visit: Payer: Medicare Other | Admitting: Primary Care

## 2022-10-19 VITALS — BP 132/80 | HR 70 | Temp 97.1°F | Ht <= 58 in | Wt 173.0 lb

## 2022-10-19 DIAGNOSIS — R21 Rash and other nonspecific skin eruption: Secondary | ICD-10-CM | POA: Diagnosis not present

## 2022-10-19 DIAGNOSIS — Z23 Encounter for immunization: Secondary | ICD-10-CM | POA: Diagnosis not present

## 2022-10-19 MED ORDER — CLOBETASOL PROPIONATE 0.05 % EX CREA
1.0000 | TOPICAL_CREAM | Freq: Two times a day (BID) | CUTANEOUS | 0 refills | Status: DC
Start: 2022-10-19 — End: 2023-01-18

## 2022-10-19 NOTE — Assessment & Plan Note (Signed)
Two separate issues today.  Discussed use of barrier cream to the upper gluteal folds to prevent breakdown.  Also discussed use of donut pillow to relieve sacral pressure.  As her vaginal itching was not responsive to antifungal treatment, we will try a topical steroid. Start clobetasol 0.05% cream as needed.  She will update.

## 2022-10-19 NOTE — Patient Instructions (Addendum)
Purchase some barrier cream to help prevent skin breakdown of your buttocks.  Purchase a donut pillow to relieve pressure from your bottom.  Apply clobetasol 0.05% cream to the skin of the vagina and left groin twice daily as needed.  Do not apply this to your face or anywhere else.  It was a pleasure to see you today!

## 2022-10-19 NOTE — Progress Notes (Signed)
Subjective:    Patient ID: Teresa Jensen, female    DOB: 1945/11/18, 77 y.o.   MRN: [id number]  HPI  Teresa Jensen is a very pleasant 77 y.o. female with a history of hypertension, COPD, postinflammatory pulmonary fibrosis, CHF, lung cancer, osteoporosis, hypothyroidism who presents today to discuss skin irritation.   She initially noticed tenderness to the skin of her upper buttocks in between the crack. She feels moisture. She believes she's getting a bedsore. She denies itching, bleeding.  She's also experienced rash with itching to the left groin and labia majora. Her symptoms began a few months ago. She's applied OTC clotrimazole without improvement.   She is active around the house, doesn't feel like she's been more sedentary. She denies changes in bathing products. She will occasionally use wipes, but this is not new.   She's applied rosebud saav OTC without improvement.    Review of Systems  Constitutional:  Negative for fever.  Skin:  Positive for rash and wound.         Past Medical History:  Diagnosis Date   (HFpEF) heart failure with preserved ejection fraction (HCC) 06/28/2011   a.) TTE 06/28/2011: EF 55-60%, mild AR, G1DD; b.) TTE 01/16/2013: EF 55-60%, mild MR; c.) TTE 02/16/2018: EF 60-65%, G1DD; d.) MPI 11/26/2018: EF 56%   Abnormal electrocardiogram 11/12/2018   Acute kidney failure (HCC) 01/12/2017   Adenoma of left adrenal gland    Anemia    Anxiety    Aortic atherosclerosis (HCC)    Bilateral carpal tunnel syndrome    CAD (coronary artery disease)    a.) HR CT chest 10/30/2021: 2 vessel CAD (LAD/RCA)   CAP (community acquired pneumonia)    COPD (chronic obstructive pulmonary disease) (HCC)    COPD exacerbation (HCC) 01/21/2018   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   Diverticulosis    Dyspnea    DUE TO HEAT WITH COPD   Epistaxis 01/09/2017   GERD (gastroesophageal reflux disease)    Herpes zoster 07/08/2015   Hypertension     Hypothyroidism    Iatrogenic hypotension 01/12/2017   Iliac artery stenosis, bilateral (HCC)    Morbid obesity with BMI of 40.0-44.9, adult (HCC)    New onset of headaches    Osteopenia    PMR (polymyalgia rheumatica) (HCC)    Pneumonia 02/13/2018   PONV (postoperative nausea and vomiting)    Pulmonary nodule, left    a.) CT chest 10/30/2021: 1.5 cm medial LUL nodule; b.) PET CT 11/21/2021: hypermetabolic LUL nodule (SUX max 9.3)   S/P carpal tunnel release 08/04/2020   Shingles    Stasis dermatitis    Strain of chest wall 01/20/2011   Tobacco abuse    Vitamin D deficiency     Social History   Socioeconomic History   Marital status: Married    Spouse name: [name]   Number of children: 1   Years of education: Not on file   Highest education level: Not on file  Occupational History   Occupation: Catering manager as a Paramedic: RETIRED    Comment: retired  Tobacco Use   Smoking status: Former    Current packs/day: 0.00    Average packs/day: 1.5 packs/day for 20.0 years (30.0 ttl pk-yrs)    Types: Cigarettes    Start date: 02/11/1989    Quit date: 02/11/2009    Years since quitting: 13.6   Smokeless tobacco: Never  Vaping Use   Vaping status: Never Used  Substance and  Sexual Activity   Alcohol use: No    Alcohol/week: 0.0 standard drinks of alcohol   Drug use: No   Sexual activity: Not Currently  Other Topics Concern   Not on file  Social History Narrative   Lives in [place] with her husband.  Takes care of her two grandchildren, ages 9 and 2, everyday in [place].  Has one son who is 27.   Desires CPR   Would not want prolonged life support if futile   Social Determinants of Health   Financial Resource Strain: Low Risk  (05/07/2022)   Overall Financial Resource Strain (CARDIA)    Difficulty of Paying Living Expenses: Not hard at all  Food Insecurity: No Food Insecurity (09/07/2022)   Hunger Vital Sign    Worried About Running Out of Food in the  Last Year: Never true    Ran Out of Food in the Last Year: Never true  Transportation Needs: No Transportation Needs (09/07/2022)   PRAPARE - Administrator, Civil Service (Medical): No    Lack of Transportation (Non-Medical): No  Physical Activity: Insufficiently Active (05/07/2022)   Exercise Vital Sign    Days of Exercise per Week: 3 days    Minutes of Exercise per Session: 30 min  Stress: No Stress Concern Present (05/07/2022)   [employer] of Occupational Health - Occupational Stress Questionnaire    Feeling of Stress : Not at all  Social Connections: Moderately Integrated (05/07/2022)   Social Connection and Isolation Panel [NHANES]    Frequency of Communication with Friends and Family: More than three times a week    Frequency of Social Gatherings with Friends and Family: More than three times a week    Attends Religious Services: More than 4 times per year    Active Member of [employer] or Organizations: No    Attends Banker Meetings: Never    Marital Status: Married  Catering manager Violence: Not At Risk (09/07/2022)   Humiliation, Afraid, Rape, and Kick questionnaire    Fear of Current or Ex-Partner: No    Emotionally Abused: No    Physically Abused: No    Sexually Abused: No    Past Surgical History:  Procedure Laterality Date   ANTERIOR CERVICAL DECOMP/DISCECTOMY FUSION  06/22/2004   C4-C6   APPENDECTOMY     CARPAL TUNNEL RELEASE Left 08/31/2019   Procedure: CARPAL TUNNEL RELEASE;  Surgeon: Donato Heinz, MD;  Location: [facility];  Service: Orthopedics;  Laterality: Left;   CARPAL TUNNEL RELEASE Right 06/22/2020   Procedure: CARPAL TUNNEL RELEASE;  Surgeon: Donato Heinz, MD;  Location: [facility];  Service: Orthopedics;  Laterality: Right;   CHOLECYSTECTOMY     COLONOSCOPY     ENDOBRONCHIAL ULTRASOUND Left 01/17/2022   Procedure: ENDOBRONCHIAL ULTRASOUND;  Surgeon: Salena Saner, MD;  Location: [facility];  Service: Pulmonary;  Laterality:  Left;   ESOPHAGOGASTRODUODENOSCOPY     HEMORRHOID SURGERY     TOTAL ABDOMINAL HYSTERECTOMY W/ BILATERAL SALPINGOOPHORECTOMY     TUBAL LIGATION      Family History  Problem Relation Age of Onset   Heart attack Mother 80   Cancer Father        lung/chest wall    Prostate cancer Father    Diabetes Brother        siblings   Hypertension Sister        x 2   Heart disease Brother    Breast cancer Paternal Grandmother    Heart  disease Sister    Diabetes Sister    Tuberculosis Sister    Cirrhosis Brother     Allergies  Allergen Reactions   Epinephrine Anaphylaxis and Shortness Of Breath   Food Rash    MANGO = cause rashes around the mouth LIPS GO NUMB   [employer] Rash    THIS IS MANGO LIPS GO NUMB   Other Rash    MANGO = cause rashes around the mouth MANGO = cause rashes around the mouth LIPS GO NUMB   Prednisone Anaphylaxis, Hives, Shortness Of Breath, Itching and Swelling    The face swells HAS TAKEN CORTISONE SHOTS WITHOUT ISSUE BEFORE   Ivp Dye [Iodinated Contrast Media] Hives   Lactose Intolerance (Gi) Diarrhea   Pneumococcal 13-Val Conj Vacc Itching and Other (See Comments)    Arm temp elevated at site of injection, red, itching   Prevnar 13 [Pneumococcal 13-Val Conj Vacc] Other (See Comments)    Arm temp elevated at site of injection, red, itching    Current Outpatient Medications on File Prior to Visit  Medication Sig Dispense Refill   atorvastatin (LIPITOR) 10 MG tablet Take 1 tablet (10 mg total) by mouth daily. for cholesterol. 90 tablet 1   Calcium Carb-Cholecalciferol (CALCIUM + VITAMIN D3 PO) Take 1,200 mg by mouth daily.     famotidine (PEPCID) 20 MG tablet TAKE 1 TABLET BY MOUTH AFTER SUPPER 90 tablet 3   FARXIGA 10 MG TABS tablet Take 1 tablet (10 mg total) by mouth daily before breakfast. 30 tablet 6   furosemide (LASIX) 40 MG tablet Take 1 tablet (40 mg total) by mouth daily. 30 tablet 0   Glycopyrrolate-Formoterol (BEVESPI AEROSPHERE) 9-4.8  MCG/ACT AERO Inhale 2 puffs into the lungs 2 (two) times daily. 3 each 3   levalbuterol (XOPENEX HFA) 45 MCG/ACT inhaler Inhale 2 puffs into the lungs every 4 (four) hours as needed for wheezing or shortness of breath. 1 each 12   levothyroxine (SYNTHROID) 75 MCG tablet TAKE 1 EVERY AM ON AN EMPTY STOMACH WITH WATER ONLY. NO FOOD OR OTHER MEDICATIONS FOR 30 MINUTES. 90 tablet 2   losartan (COZAAR) 100 MG tablet Take 1 tablet (100 mg total) by mouth daily. PLEASE CALL [phone] TO SCHEDULE A YEARLY APPOINTMENT. THANK YOU. 90 tablet 0   Multiple Vitamins-Minerals (PRESERVISION AREDS 2 PO) Take 1 tablet by mouth in the morning and at bedtime.     pantoprazole (PROTONIX) 40 MG tablet Take 1 tablet (40 mg total) by mouth daily. for heartburn. 90 tablet 3   pentoxifylline (TRENTAL) 400 MG CR tablet TAKE 1 TABLET BY MOUTH 3 TIMES DAILY WITH MEALS. 270 tablet 1   spironolactone (ALDACTONE) 25 MG tablet Take 0.5 tablets (12.5 mg total) by mouth daily. 45 tablet 3   tacrolimus (PROTOPIC) 0.1 % ointment Apply to aa's lower legs BID PRN. 60 g 3   fluconazole (DIFLUCAN) 150 MG tablet Take 1 tablet (150 mg total) by mouth daily. (Patient not taking: Reported on 10/19/2022) 1 tablet 0   No current facility-administered medications on file prior to visit.    BP 132/80   Pulse 70   Temp (!) 97.1 F (36.2 C) (Temporal)   Ht 4\' 9"  (1.448 m)   Wt 173 lb (78.5 kg)   SpO2 98% Comment: 2 1/2 L O2  BMI 37.44 kg/m  Objective:   Physical Exam Cardiovascular:     Rate and Rhythm: Normal rate and regular rhythm.  Skin:    General: Skin is warm  and dry.     Findings: Erythema and rash present.     Comments: Moderate erythema to left labia majora and mild erythema to left groin. Mild erythema without skin breakdown to upper gluteal cleft.   Neurological:     Mental Status: She is alert.           Assessment & Plan:  Rash and nonspecific skin eruption Assessment & Plan: Two separate issues  today.  Discussed use of barrier cream to the upper gluteal folds to prevent breakdown.  Also discussed use of donut pillow to relieve sacral pressure.  As her vaginal itching was not responsive to antifungal treatment, we will try a topical steroid. Start clobetasol 0.05% cream as needed.  She will update.  Orders: -     Clobetasol Propionate; Apply 1 Application topically 2 (two) times daily.  Dispense: 60 each; Refill: 0        Doreene Nest, NP

## 2022-10-25 ENCOUNTER — Other Ambulatory Visit: Payer: Self-pay | Admitting: Primary Care

## 2022-10-25 DIAGNOSIS — I739 Peripheral vascular disease, unspecified: Secondary | ICD-10-CM

## 2022-10-31 ENCOUNTER — Encounter: Payer: Self-pay | Admitting: Medical

## 2022-10-31 ENCOUNTER — Ambulatory Visit: Payer: Medicare Other | Attending: Medical | Admitting: Medical

## 2022-10-31 VITALS — BP 118/62 | HR 63 | Ht <= 58 in | Wt 171.8 lb

## 2022-10-31 DIAGNOSIS — I5032 Chronic diastolic (congestive) heart failure: Secondary | ICD-10-CM | POA: Diagnosis not present

## 2022-10-31 DIAGNOSIS — I10 Essential (primary) hypertension: Secondary | ICD-10-CM

## 2022-10-31 DIAGNOSIS — J449 Chronic obstructive pulmonary disease, unspecified: Secondary | ICD-10-CM | POA: Diagnosis present

## 2022-10-31 NOTE — Patient Instructions (Signed)
Medication Instructions:  Your physician recommends that you continue on your current medications as directed. Please refer to the Current Medication list given to you today.  *If you need a refill on your cardiac medications before your next appointment, please call your pharmacy*  Lab Work: -None ordered  Testing/Procedures: -None ordered  Follow-Up: At [facility], you and your health needs are our priority.  As part of our continuing mission to provide you with exceptional heart care, we have created designated Provider Care Teams.  These Care Teams include your primary Cardiologist (physician) and Advanced Practice Providers (APPs -  Physician Assistants and Nurse Practitioners) who all work together to provide you with the care you need, when you need it.  Your next appointment:   1 year(s)  Provider:   You may see Yvonne Kendall, MD or one of the following Advanced Practice Providers on your designated Care Team:   Nicolasa Ducking, NP Eula Listen, PA-C Cadence Fransico Michael, PA-C Charlsie Quest, NP    Other Instructions -None

## 2022-10-31 NOTE — Progress Notes (Signed)
Cardiology Office Note:    Date:  10/31/2022   ID:  Teresa Jensen, DOB [birth year], MRN [id number]  PCP:  Teresa Nest, NP  [facility] Cardiologist:  Teresa Kendall, MD  [facility] Electrophysiologist:  None   Referring MD: Teresa Nest, NP   Chief Complaint: 1 year follow-up  History of Present Illness:    Teresa Jensen is a 77 y.o. female with a hx of  HFpEF, HTN, COPD, polymyalgia rheumatica, GERD who presents for follow-up.    Initially seen 11/2018 by Dr. Okey Dupre following hospitalization for shortness of breath and weakness.  Chest x-ray suggestive of pulmonary edema.  Echocardiogram showed preserved LVEF with grade 1 diastolic dysfunction.  She did subsequent myocardial perfusion stress test which was low risk without evidence of ischemia.   Patient has had issues with BP control. She did not tolerate hydrochlorothiazide, Maxzide or amlodipine. She is on Losartan and spironolactone.   Patient was last seen 01/2022 for pre-op video visit and stable from a cardiac perspective.   The patient was admitted in September for acute on chronic heart failure, acute chronic hypoxic respiratory failure, and hypokalemia.  Echo showed LVEF 55 to 60%, indeterminate diastolic parameters, mild MR, mild AAS, mean gradient 10.5 mmHg.  Patient was diuresed and discharged on oral Lasix and spironolactone.  Follow-up labs show dehydration and Lasix was changed to as needed per PCP.  She reported she saw [facility] and spironolactone was decreased to 12.5 mg daily.    Today, the patient is overall stable from a cardiac perspective. She denies chest pain, SOB, Lle, orthopnea or pnd. She takes lasix as needed. She is taking spironolactone daily. She is on 2.5L O2 and follows with pulmonology.   Past Medical History:  Diagnosis Date   (HFpEF) heart failure with preserved ejection fraction (HCC) 06/28/2011   a.) TTE 06/28/2011: EF 55-60%, mild AR, G1DD; b.) TTE 01/16/2013: EF  55-60%, mild MR; c.) TTE 02/16/2018: EF 60-65%, G1DD; d.) MPI 11/26/2018: EF 56%   Abnormal electrocardiogram 11/12/2018   Acute kidney failure (HCC) 01/12/2017   Adenoma of left adrenal gland    Anemia    Anxiety    Aortic atherosclerosis (HCC)    Bilateral carpal tunnel syndrome    CAD (coronary artery disease)    a.) HR CT chest 10/30/2021: 2 vessel CAD (LAD/RCA)   CAP (community acquired pneumonia)    COPD (chronic obstructive pulmonary disease) (HCC)    COPD exacerbation (HCC) 01/21/2018   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   Diverticulosis    Dyspnea    DUE TO HEAT WITH COPD   Epistaxis 01/09/2017   GERD (gastroesophageal reflux disease)    Herpes zoster 07/08/2015   Hypertension    Hypothyroidism    Iatrogenic hypotension 01/12/2017   Iliac artery stenosis, bilateral (HCC)    Morbid obesity with BMI of 40.0-44.9, adult (HCC)    New onset of headaches    Osteopenia    PMR (polymyalgia rheumatica) (HCC)    Pneumonia 02/13/2018   PONV (postoperative nausea and vomiting)    Pulmonary nodule, left    a.) CT chest 10/30/2021: 1.5 cm medial LUL nodule; b.) PET CT 11/21/2021: hypermetabolic LUL nodule (SUX max 9.3)   S/P carpal tunnel release 08/04/2020   Shingles    Stasis dermatitis    Strain of chest wall 01/20/2011   Tobacco abuse    Vitamin D deficiency     Past Surgical History:  Procedure Laterality Date   ANTERIOR  CERVICAL DECOMP/DISCECTOMY FUSION  06/22/2004   C4-C6   APPENDECTOMY     CARPAL TUNNEL RELEASE Left 08/31/2019   Procedure: CARPAL TUNNEL RELEASE;  Surgeon: Teresa Heinz, MD;  Location: [facility] ORS;  Service: Orthopedics;  Laterality: Left;   CARPAL TUNNEL RELEASE Right 06/22/2020   Procedure: CARPAL TUNNEL RELEASE;  Surgeon: Teresa Heinz, MD;  Location: [facility] ORS;  Service: Orthopedics;  Laterality: Right;   CHOLECYSTECTOMY     COLONOSCOPY     ENDOBRONCHIAL ULTRASOUND Left 01/17/2022   Procedure: ENDOBRONCHIAL ULTRASOUND;  Surgeon:  Teresa Saner, MD;  Location: [facility] ORS;  Service: Pulmonary;  Laterality: Left;   ESOPHAGOGASTRODUODENOSCOPY     HEMORRHOID SURGERY     TOTAL ABDOMINAL HYSTERECTOMY W/ BILATERAL SALPINGOOPHORECTOMY     TUBAL LIGATION      Current Medications: Current Meds  Medication Sig   atorvastatin (LIPITOR) 10 MG tablet Take 1 tablet (10 mg total) by mouth daily. for cholesterol.   Calcium Carb-Cholecalciferol (CALCIUM + VITAMIN D3 PO) Take 1,200 mg by mouth daily.   clobetasol cream (TEMOVATE) 0.05 % Apply 1 Application topically 2 (two) times daily.   famotidine (PEPCID) 20 MG tablet TAKE 1 TABLET BY MOUTH AFTER SUPPER   FARXIGA 10 MG TABS tablet Take 1 tablet (10 mg total) by mouth daily before breakfast.   furosemide (LASIX) 40 MG tablet Take 1 tablet (40 mg total) by mouth daily.   Glycopyrrolate-Formoterol (BEVESPI AEROSPHERE) 9-4.8 MCG/ACT AERO Inhale 2 puffs into the lungs 2 (two) times daily.   levalbuterol (XOPENEX HFA) 45 MCG/ACT inhaler Inhale 2 puffs into the lungs every 4 (four) hours as needed for wheezing or shortness of breath.   levothyroxine (SYNTHROID) 75 MCG tablet TAKE 1 EVERY AM ON AN EMPTY STOMACH WITH WATER ONLY. NO FOOD OR OTHER MEDICATIONS FOR 30 MINUTES.   losartan (COZAAR) 100 MG tablet Take 1 tablet (100 mg total) by mouth daily. PLEASE CALL [phone] TO SCHEDULE A YEARLY APPOINTMENT. THANK YOU.   Multiple Vitamins-Minerals (PRESERVISION AREDS 2 PO) Take 1 tablet by mouth in the morning and at bedtime.   pantoprazole (PROTONIX) 40 MG tablet Take 1 tablet (40 mg total) by mouth daily. for heartburn.   pentoxifylline (TRENTAL) 400 MG CR tablet TAKE 1 TABLET BY MOUTH 3 TIMES DAILY WITH MEALS.   spironolactone (ALDACTONE) 25 MG tablet Take 0.5 tablets (12.5 mg total) by mouth daily.   tacrolimus (PROTOPIC) 0.1 % ointment Apply to aa's lower legs BID PRN.     Allergies:   Epinephrine, Food, Mangifera indica, Other, Prednisone, Ivp dye [iodinated contrast media],  Lactose intolerance (gi), Pneumococcal 13-val conj vacc, and Prevnar 13 [pneumococcal 13-val conj vacc]   Social History   Socioeconomic History   Marital status: Married    Spouse name: Teresa Jensen   Number of children: 1   Years of education: Not on file   Highest education level: Not on file  Occupational History   Occupation: Catering manager as a Paramedic: RETIRED    Comment: retired  Tobacco Use   Smoking status: Former    Current packs/day: 0.00    Average packs/day: 1.5 packs/day for 20.0 years (30.0 ttl pk-yrs)    Types: Cigarettes    Start date: 02/11/1989    Quit date: 02/11/2009    Years since quitting: 13.7   Smokeless tobacco: Never  Vaping Use   Vaping status: Never Used  Substance and Sexual Activity   Alcohol use: No    Alcohol/week: 0.0 standard drinks  of alcohol   Drug use: No   Sexual activity: Not Currently  Other Topics Concern   Not on file  Social History Narrative   Lives in [place] with her husband.  Takes care of her two grandchildren, ages 65 and 2, everyday in [place].  Has one son who is 52.   Desires CPR   Would not want prolonged life support if futile   Social Determinants of Health   Financial Resource Strain: Low Risk  (05/07/2022)   Overall Financial Resource Strain (CARDIA)    Difficulty of Paying Living Expenses: Not hard at all  Food Insecurity: No Food Insecurity (09/07/2022)   Hunger Vital Sign    Worried About Running Out of Food in the Last Year: Never true    Ran Out of Food in the Last Year: Never true  Transportation Needs: No Transportation Needs (09/07/2022)   PRAPARE - Administrator, Civil Service (Medical): No    Lack of Transportation (Non-Medical): No  Physical Activity: Insufficiently Active (05/07/2022)   Exercise Vital Sign    Days of Exercise per Week: 3 days    Minutes of Exercise per Session: 30 min  Stress: No Stress Concern Present (05/07/2022)   [employer] of Occupational Health -  Occupational Stress Questionnaire    Feeling of Stress : Not at all  Social Connections: Moderately Integrated (05/07/2022)   Social Connection and Isolation Panel [NHANES]    Frequency of Communication with Friends and Family: More than three times a week    Frequency of Social Gatherings with Friends and Family: More than three times a week    Attends Religious Services: More than 4 times per year    Active Member of [employer] or Organizations: No    Attends Engineer, structural: Never    Marital Status: Married     Family History: The patient's family history includes Breast cancer in her paternal grandmother; Cancer in her father; Cirrhosis in her brother; Diabetes in her brother and sister; Heart attack (age of onset: 52) in her mother; Heart disease in her brother and sister; Hypertension in her sister; Prostate cancer in her father; Tuberculosis in her sister.  ROS:   Please see the history of present illness.     All other systems reviewed and are negative.  EKGs/Labs/Other Studies Reviewed:    The following studies were reviewed today:  Echo 02/2018  1. The left ventricle has normal systolic function with an ejection  fraction of 60-65%. The cavity size was normal. Left ventricular diastolic  Doppler parameters are consistent with impaired relaxation.   2. The right ventricle has normal systolic function. The cavity was  normal. There is no increase in right ventricular wall thickness.   3. Right atrial pressure is estimated at 10 mmHg.   4. Unable to estimate RVSP    Myoview Lexiscan 11/21/2018 Narrative & Impression  Low risk, probably normal pharmacologic myocardial perfusion stress test. There is a small in size, mild in severity, fixed defect involving the apical anterior and apical segments. This is most consistent with artifact (attenuation and apical thinning) but cannot rule out subtle scar. There is no significant ischemia. The left ventricular ejection  fraction is normal (56%). There is no significant coronary artery calcification. Aortic atherosclerosis is noted on the attenuation correction CT. This is a low risk study.    EKG:  EKG is  ordered today.  The ekg ordered today demonstrates NSR 61bpm, sinus arrhythmia, PVC  Recent  Labs: 02/22/2022: TSH 2.17 05/30/2022: ALT 13 09/06/2022: B Natriuretic Peptide 237.0 09/08/2022: Hemoglobin 12.3; Magnesium 1.7; Platelets 319 09/13/2022: Pro B Natriuretic peptide (BNP) 110.0 10/17/2022: BUN 26; Creatinine, Ser 0.98; Potassium 4.1; Sodium 138  Recent Lipid Panel    Component Value Date/Time   CHOL 141 02/22/2022 0840   TRIG 149.0 02/22/2022 0840   HDL 51.80 02/22/2022 0840   CHOLHDL 3 02/22/2022 0840   VLDL 29.8 02/22/2022 0840   LDLCALC 59 02/22/2022 0840   Physical Exam:    VS:  BP 118/62 (BP Location: Left Arm, Patient Position: Sitting, Cuff Size: Normal)   Pulse 63   Ht 4\' 9"  (1.448 m)   Wt 171 lb 12.8 oz (77.9 kg)   SpO2 98%   BMI 37.18 kg/m     Wt Readings from Last 3 Encounters:  10/31/22 171 lb 12.8 oz (77.9 kg)  10/19/22 173 lb (78.5 kg)  10/08/22 174 lb (78.9 kg)     GEN:  Well nourished, well developed in no acute distress HEENT: Normal NECK: No JVD; No carotid bruits LYMPHATICS: No lymphadenopathy CARDIAC: RRR, no murmurs, rubs, gallops RESPIRATORY:  diffusely diminished ABDOMEN: Soft, non-tender, non-distended MUSCULOSKELETAL:  No edema; No deformity  SKIN: Warm and dry NEUROLOGIC:  Alert and oriented x 3 PSYCHIATRIC:  Normal affect   ASSESSMENT:    1. Chronic heart failure with preserved ejection fraction (HFpEF) (HCC)   2. Essential hypertension   3. Chronic obstructive pulmonary disease, unspecified COPD type (HCC)    PLAN:    In order of problems listed above:  HFpEF Echo 09/2022 showed LVEF 55-60%, no WMA, normal RVSF, mild MR, mild AS, mean gradient 10.72mmHg. Patient is euvolemic on exam. She also follows with [facility]. She takes lasix as  needed for swelling. Continue Farxiga 10 mg daily, spironolactone 12.5mg  daily and Losartan 100mg  daily. Recent labs stable.   HTN BP is normal today, continue Losartan and spironolactone.  COPD On baseline O2. She follows with pulmonology. During the day O2 is 2.5L.   Disposition: Follow up in 1 year(s) with MD/APP    Signed, Aubert Choyce David Stall, PA-C  10/31/2022 10:30 AM    [facility]

## 2022-11-08 ENCOUNTER — Ambulatory Visit: Payer: Medicare Other | Admitting: Medical

## 2022-11-15 ENCOUNTER — Other Ambulatory Visit: Payer: Self-pay | Admitting: Internal Medicine

## 2022-11-18 ENCOUNTER — Encounter: Payer: Self-pay | Admitting: Family

## 2022-11-21 ENCOUNTER — Ambulatory Visit: Payer: Medicare Other | Admitting: Primary Care

## 2022-11-21 ENCOUNTER — Other Ambulatory Visit: Payer: Self-pay | Admitting: Primary Care

## 2022-11-21 ENCOUNTER — Encounter: Payer: Self-pay | Admitting: Primary Care

## 2022-11-21 VITALS — BP 126/82 | HR 64 | Temp 98.2°F | Ht <= 58 in | Wt 169.0 lb

## 2022-11-21 DIAGNOSIS — M79605 Pain in left leg: Secondary | ICD-10-CM

## 2022-11-21 DIAGNOSIS — R3915 Urgency of urination: Secondary | ICD-10-CM

## 2022-11-21 LAB — POC URINALSYSI DIPSTICK (AUTOMATED)
Bilirubin, UA: NEGATIVE
Blood, UA: NEGATIVE
Glucose, UA: POSITIVE — AB
Ketones, UA: NEGATIVE
Nitrite, UA: NEGATIVE
Protein, UA: NEGATIVE
Spec Grav, UA: <=1.005 — AB (ref 1.010–1.025)
Urobilinogen, UA: 0.2 U/dL
pH, UA: 6 (ref 5.0–8.0)

## 2022-11-21 NOTE — Addendum Note (Signed)
Addended by: Alvina Chou on: 11/21/2022 04:13 PM   Modules accepted: Orders

## 2022-11-21 NOTE — Assessment & Plan Note (Signed)
Exam today more representative of mild phlebitis as symptoms are over one of her lower extremity varicose veins. Reassure provided that her vein should not bust.   Continue compression socks. Discussed PRN ibuprofen.  Reviewed ABIs and venous US from September 2024.

## 2022-11-21 NOTE — Patient Instructions (Addendum)
We will be in touch on Friday with your urine test results.  You could take some Ibuprofen if needed for the leg.   It was a pleasure to see you today!

## 2022-11-21 NOTE — Progress Notes (Signed)
Subjective:    Patient ID: Teresa Jensen, female    DOB: Mar 25, 1945, 76 y.o.   MRN: [id number]  Leg Pain     Teresa Jensen is a very pleasant 77 y.o. 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  1) Lower Extremity Pain: Symptom onset a few nights ago with a cramp to the left lower extremity at the mid part of the anterior lateral lower portion of the lower extremity (between knee and ankle). The pain is just over a vein. She denies cramping since two nights ago, but she does have tenderness over the vein when she touches it. She questions if she has a varicose vein that is about to burst.   She underwent ankle-brachial index studies on 09/04/2022 which revealed abnormal bilateral brachial artery waveforms suggesting PAD.  Her left lower extremity revealed mild left lower extremity arterial disease.  Recommendations were for follow-up ultrasound in 12 months.  She also underwent bilateral venous imaging on 09/06/2022 which was negative for bilateral DVT.  She wears compression socks daily. She denies calf pain, skin color changes, claudication symptoms, pain with walking.  2) Urinary Urgency: Symptom onset two days ago with urinary urgency, dribbling urine, dysuria, pelvic pressure, and some burning. She denies hematuria, vaginal itching, vaginal discharge. She drinks 1 cup of coffee daily which has not changed from her norm.   Review of Systems  Genitourinary:  Positive for dysuria, frequency and pelvic pain.  Skin:  Negative for color change and wound.         Past Medical History:  Diagnosis Date   (HFpEF) heart failure with preserved ejection fraction (HCC) 06/28/2011   a.) TTE 06/28/2011: EF 55-60%, mild AR, G1DD; b.) TTE 01/16/2013: EF 55-60%, mild MR; c.) TTE  02/16/2018: EF 60-65%, G1DD; d.) MPI 11/26/2018: EF 56%   Abnormal electrocardiogram 11/12/2018   Acute kidney failure (HCC) 01/12/2017   Acute on chronic respiratory failure with hypoxia (HCC) 09/06/2022   Adenoma of left adrenal gland    Anemia    Anxiety    Aortic atherosclerosis (HCC)    Bilateral carpal tunnel syndrome    CAD (coronary artery disease)    a.) HR CT chest 10/30/2021: 2 vessel CAD (LAD/RCA)   CAP (community acquired pneumonia)    COPD (chronic obstructive pulmonary disease) (HCC)    COPD exacerbation (HCC) 01/21/2018   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   Diverticulosis    Dyspnea    DUE TO HEAT WITH COPD   Epistaxis 01/09/2017   GERD (gastroesophageal reflux disease)    Herpes zoster 07/08/2015   Hypertension    Hypothyroidism    Iatrogenic hypotension 01/12/2017   Iliac artery stenosis, bilateral (HCC)    Morbid obesity with BMI of 40.0-44.9, adult (HCC)    New onset of headaches    Osteopenia    PMR (polymyalgia rheumatica) (HCC)    Pneumonia 02/13/2018   PONV (postoperative nausea and vomiting)    Pulmonary nodule, left    a.) CT chest 10/30/2021: 1.5 cm medial LUL nodule; b.) PET CT 11/21/2021: hypermetabolic LUL nodule (SUX max 9.3)   S/P carpal tunnel release 08/04/2020   Shingles    Stasis dermatitis    Strain of chest wall 01/20/2011   Tobacco abuse    Vitamin D deficiency     Social History  Socioeconomic History   Marital status: Married    Spouse name: [name]   Number of children: 1   Years of education: Not on file   Highest education level: Not on file  Occupational History   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  Takes care of her two grandchildren, ages 91 and 2, everyday in [place].  Has one son who is 79.   Desires CPR   Would not want prolonged life support if futile   Social Determinants of Health   Financial Resource Strain: Low Risk  (05/07/2022)   Overall Financial Resource Strain (CARDIA)    Difficulty of Paying Living Expenses: Not hard at all  Food Insecurity: No Food Insecurity (09/07/2022)   Hunger Vital Sign    Worried About Running Out of Food in the Last Year: Never true    Ran Out of Food in the Last Year: Never true  Transportation Needs: No Transportation Needs (09/07/2022)   PRAPARE - Administrator, Civil Service (Medical): No    Lack of Transportation (Non-Medical): No  Physical Activity: Insufficiently Active (05/07/2022)   Exercise Vital Sign    Days of Exercise per Week: 3 days    Minutes of Exercise per Session: 30 min  Stress: No Stress Concern Present (05/07/2022)   [employer] of Occupational Health - Occupational Stress Questionnaire    Feeling of Stress : Not at all  Social Connections: Moderately Integrated (05/07/2022)   Social Connection and Isolation Panel [NHANES]    Frequency of Communication with Friends and Family: More than three times a week    Frequency of Social Gatherings with Friends and Family: More than three times a week    Attends Religious Services: More than 4 times per year    Active Member of [employer] or Organizations: No    Attends Banker Meetings: Never    Marital Status: Married  Catering manager Violence: Not At Risk (09/07/2022)   Humiliation, Afraid, Rape, and Kick questionnaire    Fear of Current or Ex-Partner: No     Emotionally Abused: No    Physically Abused: No    Sexually Abused: No    Past Surgical History:  Procedure Laterality Date   ANTERIOR CERVICAL DECOMP/DISCECTOMY FUSION  06/22/2004   C4-C6   APPENDECTOMY     CARPAL TUNNEL RELEASE Left 08/31/2019   Procedure: CARPAL TUNNEL RELEASE;  Surgeon: Donato Heinz, MD;  Location: [facility] ORS;  Service: Orthopedics;  Laterality: Left;   CARPAL TUNNEL RELEASE Right 06/22/2020   Procedure: CARPAL TUNNEL RELEASE;  Surgeon: Donato Heinz, MD;  Location: [facility] ORS;  Service: Orthopedics;  Laterality: Right;   CHOLECYSTECTOMY     COLONOSCOPY     ENDOBRONCHIAL ULTRASOUND Left 01/17/2022  Procedure: ENDOBRONCHIAL ULTRASOUND;  Surgeon: Salena Saner, MD;  Location: [facility] ORS;  Service: Pulmonary;  Laterality: Left;   ESOPHAGOGASTRODUODENOSCOPY     HEMORRHOID SURGERY     TOTAL ABDOMINAL HYSTERECTOMY W/ BILATERAL SALPINGOOPHORECTOMY     TUBAL LIGATION      Family History  Problem Relation Age of Onset   Heart attack Mother 68   Cancer Father        lung/chest wall    Prostate cancer Father    Diabetes Brother        siblings   Hypertension Sister        x 2   Heart disease Brother    Breast cancer Paternal Grandmother    Heart disease Sister    Diabetes Sister    Tuberculosis Sister    Cirrhosis Brother     Allergies  Allergen Reactions   Epinephrine Anaphylaxis and Shortness Of Breath   Food Rash    MANGO = cause rashes around the mouth LIPS GO NUMB   Mangifera [employer] Rash    THIS IS MANGO LIPS GO NUMB   Other Rash    MANGO = cause rashes around the mouth MANGO = cause rashes around the mouth LIPS GO NUMB   Prednisone Anaphylaxis, Hives, Shortness Of Breath, Itching and Swelling    The face swells HAS TAKEN CORTISONE SHOTS WITHOUT ISSUE BEFORE   Ivp Dye [Iodinated Contrast Media] Hives   Lactose Intolerance (Gi) Diarrhea   Pneumococcal 13-Val Conj Vacc Itching and Other (See Comments)    Arm temp elevated at site of  injection, red, itching   Prevnar 13 [Pneumococcal 13-Val Conj Vacc] Other (See Comments)    Arm temp elevated at site of injection, red, itching    Current Outpatient Medications on File Prior to Visit  Medication Sig Dispense Refill   alendronate (FOSAMAX) 70 MG tablet Take 70 mg by mouth once a week. Take with a full glass of water on an empty stomach.     atorvastatin (LIPITOR) 10 MG tablet Take 1 tablet (10 mg total) by mouth daily. for cholesterol. 90 tablet 1   Calcium Carb-Cholecalciferol (CALCIUM + VITAMIN D3 PO) Take 1,200 mg by mouth daily.     clobetasol cream (TEMOVATE) 0.05 % Apply 1 Application topically 2 (two) times daily. 60 each 0   famotidine (PEPCID) 20 MG tablet TAKE 1 TABLET BY MOUTH AFTER SUPPER 90 tablet 3   FARXIGA 10 MG TABS tablet Take 1 tablet (10 mg total) by mouth daily before breakfast. 30 tablet 6   furosemide (LASIX) 40 MG tablet Take 1 tablet (40 mg total) by mouth daily. 30 tablet 0   Glycopyrrolate-Formoterol (BEVESPI AEROSPHERE) 9-4.8 MCG/ACT AERO Inhale 2 puffs into the lungs 2 (two) times daily. 3 each 3   levalbuterol (XOPENEX HFA) 45 MCG/ACT inhaler Inhale 2 puffs into the lungs every 4 (four) hours as needed for wheezing or shortness of breath. 1 each 12   levothyroxine (SYNTHROID) 75 MCG tablet TAKE 1 EVERY AM ON AN EMPTY STOMACH WITH WATER ONLY. NO FOOD OR OTHER MEDICATIONS FOR 30 MINUTES. 90 tablet 2   losartan (COZAAR) 100 MG tablet TAKE 1 TABLET (100 MG TOTAL) BY MOUTH DAILY. PLEASE CALL [phone] TO SCHEDULE A YEARLY APPOINTMENT. THANK YOU. 90 tablet 3   Multiple Vitamins-Minerals (PRESERVISION AREDS 2 PO) Take 1 tablet by mouth in the morning and at bedtime.     pantoprazole (PROTONIX) 40 MG tablet Take 1 tablet (40 mg total) by mouth daily. for  heartburn. 90 tablet 3   pentoxifylline (TRENTAL) 400 MG CR tablet TAKE 1 TABLET BY MOUTH 3 TIMES DAILY WITH MEALS. 270 tablet 1   spironolactone (ALDACTONE) 25 MG tablet Take 0.5 tablets (12.5 mg  total) by mouth daily. 45 tablet 3   tacrolimus (PROTOPIC) 0.1 % ointment Apply to aa's lower legs BID PRN. 60 g 3   No current facility-administered medications on file prior to visit.    BP 126/82   Pulse 64   Temp 98.2 F (36.8 C) (Temporal)   Ht 4\' 9"  (1.448 m)   Wt 169 lb (76.7 kg)   SpO2 98% Comment: 2 L of O2  BMI 36.57 kg/m  Objective:   Physical Exam Cardiovascular:     Rate and Rhythm: Normal rate and regular rhythm.  Pulmonary:     Effort: Pulmonary effort is normal.     Breath sounds: Normal breath sounds.  Musculoskeletal:     Cervical back: Neck supple.  Skin:    General: Skin is warm and dry.  Neurological:     Mental Status: She is alert and oriented to person, place, and time.  Psychiatric:        Mood and Affect: Mood normal.           Assessment & Plan:  Urinary urgency Assessment & Plan: UA today with trace leuks and glucose. Negative nitrites and blood.  Culture sent, await results.    Orders: -     POCT Urinalysis Dipstick (Automated)  Pain of left lower extremity Assessment & Plan: Exam today more representative of mild phlebitis as symptoms are over one of her lower extremity varicose veins. Reassure provided that her vein should not bust.   Continue compression socks. Discussed PRN ibuprofen.  Reviewed ABIs and venous US from September 2024.         Doreene Nest, NP

## 2022-11-21 NOTE — Assessment & Plan Note (Signed)
UA today with trace leuks and glucose. Negative nitrites and blood.  Culture sent, await results.

## 2022-11-23 ENCOUNTER — Other Ambulatory Visit: Payer: Self-pay | Admitting: Primary Care

## 2022-11-23 DIAGNOSIS — N3000 Acute cystitis without hematuria: Secondary | ICD-10-CM

## 2022-11-23 LAB — URINE CULTURE
MICRO NUMBER:: 15757023
SPECIMEN QUALITY:: ADEQUATE

## 2022-11-23 MED ORDER — SULFAMETHOXAZOLE-TRIMETHOPRIM 800-160 MG PO TABS
1.0000 | ORAL_TABLET | Freq: Two times a day (BID) | ORAL | 0 refills | Status: DC
Start: 2022-11-23 — End: 2022-12-05

## 2022-11-23 NOTE — Telephone Encounter (Signed)
Pt called Jill's number, Noreene Larsson reached out to [place] to ask for followup. Per Noreene Larsson, pt is expecting application to be mailed to her address on file. Will fax provider portion when pt portion is received

## 2022-11-30 ENCOUNTER — Other Ambulatory Visit: Payer: Self-pay | Admitting: Dermatology

## 2022-11-30 DIAGNOSIS — M793 Panniculitis, unspecified: Secondary | ICD-10-CM

## 2022-12-05 ENCOUNTER — Encounter: Payer: Medicare Other | Admitting: Dermatology

## 2022-12-05 ENCOUNTER — Other Ambulatory Visit: Payer: Self-pay

## 2022-12-05 ENCOUNTER — Ambulatory Visit: Payer: Medicare Other | Admitting: Physician Assistant

## 2022-12-05 NOTE — Progress Notes (Signed)
Celso Amy, PA-C [address]  [address]  [place], [name] [address]  Main: [phone]  Fax: [phone]   Gastroenterology Consultation  Referring Provider:     Doreene Nest, NP Primary Care Physician:  Doreene Nest, NP Primary Gastroenterologist:  Celso Amy, PA-C / Dr. Wyline Mood   Reason for Consultation:     Positive Cologuard        HPI:   Teresa Jensen is a 77 y.o. y/o female referred for consultation & management  by Doreene Nest, NP to evaluate positive screening Cologuard test done 05/2022.  She has had normal CBC labs with hemoglobin 12 g.  No anemia.  She has had 20 pound unintentional weight loss in the past 6 months.  Denies melena, hematochezia, abdominal pain, heartburn, or constipation.  Typically has 2 or 3 bowel movements daily with occasional loose stool.  No constipation.  She was diagnosed with lung cancer 05/2022 and has been on oxygen since then.  Has COPD.  Followed by pulmonologist Dr. Jayme Cloud.  Patient had lung biopsy 05/2022 and it was difficult for her to wake up from anesthesia.  She is very fearful of being sedated.  Has history of CHF followed by cardiology.  Echo 09/2022 showed LVEF 55 to 60%.  06/2011 colonoscopy by Dr. Loreta Ave: Diverticulosis, otherwise normal with no polyps.  Previous history of adenomatous colon polyps before 2013.  7-year repeat colonoscopy was recommended.  Past Medical History:  Diagnosis Date   (HFpEF) heart failure with preserved ejection fraction (HCC) 06/28/2011   a.) TTE 06/28/2011: EF 55-60%, mild AR, G1DD; b.) TTE 01/16/2013: EF 55-60%, mild MR; c.) TTE 02/16/2018: EF 60-65%, G1DD; d.) MPI 11/26/2018: EF 56%   Abnormal electrocardiogram 11/12/2018   Acute kidney failure (HCC) 01/12/2017   Acute on chronic respiratory failure with hypoxia (HCC) 09/06/2022   Adenoma of left adrenal gland    Anemia    Anxiety    Aortic atherosclerosis (HCC)    Bilateral carpal tunnel syndrome    CAD (coronary  artery disease)    a.) HR CT chest 10/30/2021: 2 vessel CAD (LAD/RCA)   CAP (community acquired pneumonia)    COPD (chronic obstructive pulmonary disease) (HCC)    COPD exacerbation (HCC) 01/21/2018   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   Diverticulosis    Dyspnea    DUE TO HEAT WITH COPD   Epistaxis 01/09/2017   GERD (gastroesophageal reflux disease)    Herpes zoster 07/08/2015   Hypertension    Hypothyroidism    Iatrogenic hypotension 01/12/2017   Iliac artery stenosis, bilateral (HCC)    Morbid obesity with BMI of 40.0-44.9, adult (HCC)    New onset of headaches    Osteopenia    PMR (polymyalgia rheumatica) (HCC)    Pneumonia 02/13/2018   PONV (postoperative nausea and vomiting)    Pulmonary nodule, left    a.) CT chest 10/30/2021: 1.5 cm medial LUL nodule; b.) PET CT 11/21/2021: hypermetabolic LUL nodule (SUX max 9.3)   S/P carpal tunnel release 08/04/2020   Shingles    Stasis dermatitis    Strain of chest wall 01/20/2011   Tobacco abuse    Vitamin D deficiency     Past Surgical History:  Procedure Laterality Date   ANTERIOR CERVICAL DECOMP/DISCECTOMY FUSION  06/22/2004   C4-C6   APPENDECTOMY     CARPAL TUNNEL RELEASE Left 08/31/2019   Procedure: CARPAL TUNNEL RELEASE;  Surgeon: Donato Heinz, MD;  Location: [facility];  Service:  Orthopedics;  Laterality: Left;   CARPAL TUNNEL RELEASE Right 06/22/2020   Procedure: CARPAL TUNNEL RELEASE;  Surgeon: Donato Heinz, MD;  Location: [facility];  Service: Orthopedics;  Laterality: Right;   CHOLECYSTECTOMY     COLONOSCOPY     ENDOBRONCHIAL ULTRASOUND Left 01/17/2022   Procedure: ENDOBRONCHIAL ULTRASOUND;  Surgeon: Salena Saner, MD;  Location: [facility];  Service: Pulmonary;  Laterality: Left;   ESOPHAGOGASTRODUODENOSCOPY     HEMORRHOID SURGERY     TOTAL ABDOMINAL HYSTERECTOMY W/ BILATERAL SALPINGOOPHORECTOMY     TUBAL LIGATION      Prior to Admission medications   Medication Sig Start Date End Date Taking?  Authorizing Provider  alendronate (FOSAMAX) 70 MG tablet Take 70 mg by mouth once a week. Take with a full glass of water on an empty stomach.    [provider]  atorvastatin (LIPITOR) 10 MG tablet Take 1 tablet (10 mg total) by mouth daily. for cholesterol. 09/05/22   Doreene Nest, NP  Calcium Carb-Cholecalciferol (CALCIUM + VITAMIN D3 PO) Take 1,200 mg by mouth daily.    [provider]  clobetasol cream (TEMOVATE) 0.05 % Apply 1 Application topically 2 (two) times daily. 10/19/22   Doreene Nest, NP  famotidine (PEPCID) 20 MG tablet TAKE 1 TABLET BY MOUTH AFTER SUPPER 08/27/22   Nyoka Cowden, MD  FARXIGA 10 MG TABS tablet Take 1 tablet (10 mg total) by mouth daily before breakfast. 09/17/22   Delma Freeze, FNP  furosemide (LASIX) 40 MG tablet Take 1 tablet (40 mg total) by mouth daily. 09/08/22 09/08/23  Marrion Coy, MD  Glycopyrrolate-Formoterol (BEVESPI AEROSPHERE) 9-4.8 MCG/ACT AERO Inhale 2 puffs into the lungs 2 (two) times daily. 10/08/22   Salena Saner, MD  levalbuterol [facility] HFA) 45 MCG/ACT inhaler Inhale 2 puffs into the lungs every 4 (four) hours as needed for wheezing or shortness of breath. 08/15/22   Salena Saner, MD  levothyroxine (SYNTHROID) 75 MCG tablet TAKE 1 EVERY AM ON AN EMPTY STOMACH WITH WATER ONLY. NO FOOD OR OTHER MEDICATIONS FOR 30 MINUTES. 05/20/22   Doreene Nest, NP  losartan (COZAAR) 100 MG tablet TAKE 1 TABLET (100 MG TOTAL) BY MOUTH DAILY. PLEASE CALL [phone] TO SCHEDULE A YEARLY APPOINTMENT. THANK YOU. 11/15/22   End, Cristal Deer, MD  Multiple Vitamins-Minerals (PRESERVISION AREDS 2 PO) Take 1 tablet by mouth in the morning and at bedtime.    [provider]  pantoprazole (PROTONIX) 40 MG tablet Take 1 tablet (40 mg total) by mouth daily. for heartburn. 08/08/22   Doreene Nest, NP  pentoxifylline (TRENTAL) 400 MG CR tablet TAKE 1 TABLET BY MOUTH 3 TIMES DAILY WITH MEALS. 06/18/22   Willeen Niece, MD   spironolactone (ALDACTONE) 25 MG tablet Take 0.5 tablets (12.5 mg total) by mouth daily. 10/09/22   Clarisa Kindred A, FNP  tacrolimus (PROTOPIC) 0.1 % ointment Apply to aa's lower legs BID PRN. 05/17/22   Moye, [place], MD    Family History  Problem Relation Age of Onset   Heart attack Mother 36   Cancer Father        lung/chest wall    Prostate cancer Father    Diabetes Brother        siblings   Hypertension Sister        x 2   Heart disease Brother    Breast cancer Paternal Grandmother    Heart disease Sister    Diabetes Sister    Tuberculosis Sister  Cirrhosis Brother      Social History   Tobacco Use   Smoking status: Former    Current packs/day: 0.00    Average packs/day: 1.5 packs/day for 20.0 years (30.0 ttl pk-yrs)    Types: Cigarettes    Start date: 02/11/1989    Quit date: 02/11/2009    Years since quitting: 13.8   Smokeless tobacco: Never  Vaping Use   Vaping status: Never Used  Substance Use Topics   Alcohol use: No    Alcohol/week: 0.0 standard drinks of alcohol   Drug use: No    Allergies as of 12/06/2022 - Review Complete 12/06/2022  Allergen Reaction Noted   Epinephrine Anaphylaxis and Shortness Of Breath 08/26/2019   Food Rash 06/28/2014   Mangifera indica Rash 08/11/2019   Other Rash 06/28/2014   Prednisone Anaphylaxis, Hives, Shortness Of Breath, Itching, and Swelling 09/29/2009   Ivp dye [iodinated contrast media] Hives 07/17/2022   Lactose intolerance (gi) Diarrhea 09/13/2022   Pneumococcal 13-val conj vacc Itching and Other (See Comments) 11/22/2014   Prevnar 13 [pneumococcal 13-val conj vacc] Other (See Comments) 11/22/2014    Review of Systems:    All systems reviewed and negative except where noted in HPI.   Physical Exam:  BP 136/73 (BP Location: Right Arm, Patient Position: Sitting, Cuff Size: Large)   Pulse 82   Temp (!) 97.3 F (36.3 C) (Oral)   Ht 4\' 9"  (1.448 m)   Wt 167 lb 8 oz (76 kg)   BMI 36.25 kg/m  No LMP recorded.  Patient has had a hysterectomy.  General:   Alert,  Well-developed, well-nourished, pleasant and cooperative in NAD Lungs: Currently wearing oxygen nasal cannula.  Respirations even and mildly labored.  Mild expiratory wheezes throughout posterior lung fields.  No rales or rhonchi.  No acute distress. Heart:  Regular rate and rhythm; no murmurs, clicks, rubs, or gallops. Abdomen:  Normal bowel sounds.  No bruits.  Soft, and non-distended without masses, hepatosplenomegaly or hernias noted.  No Tenderness.  No guarding or rebound tenderness.    Neurologic:  Alert and oriented x3;  grossly normal neurologically. Psych:  Alert and cooperative. Normal mood and affect.  Imaging Studies: No results found.  Assessment and Plan:   Teresa Jensen is a 77 y.o. y/o female has been referred for:  Positive Cologuard Abnormal Weight Loss - Down 20 lbs in 6 months. Lung Cancer and COPD; On Oxygen CHF  Plan: -I offered to schedule Colonoscopy today.  Risks and benefits of colonoscopy were discussed. -Pt. Declined to schedule Colonoscopy due to risks of the procedure given her current health status.  If she decides to schedule a colonoscopy, then she would need pulmonary and cardiac clearance prior to procedure. -Scheduling CT Abdomen / Pelvis without contrast to rule out any major colon masses or colon cancer.  Patient is allergic to contrast (causes hives).  Follow up Will be based on CT results.  Celso Amy, PA-C

## 2022-12-06 ENCOUNTER — Ambulatory Visit: Payer: Medicare Other | Admitting: Pulmonary Disease

## 2022-12-06 ENCOUNTER — Encounter: Payer: Self-pay | Admitting: Pulmonary Disease

## 2022-12-06 ENCOUNTER — Encounter: Payer: Self-pay | Admitting: Physician Assistant

## 2022-12-06 ENCOUNTER — Ambulatory Visit: Payer: Medicare Other | Admitting: Physician Assistant

## 2022-12-06 VITALS — BP 124/80 | HR 83 | Temp 97.1°F | Ht <= 58 in | Wt 168.0 lb

## 2022-12-06 VITALS — BP 136/73 | HR 82 | Temp 97.3°F | Ht <= 58 in | Wt 167.5 lb

## 2022-12-06 DIAGNOSIS — J4489 Other specified chronic obstructive pulmonary disease: Secondary | ICD-10-CM

## 2022-12-06 DIAGNOSIS — Z87891 Personal history of nicotine dependence: Secondary | ICD-10-CM

## 2022-12-06 DIAGNOSIS — C3412 Malignant neoplasm of upper lobe, left bronchus or lung: Secondary | ICD-10-CM

## 2022-12-06 DIAGNOSIS — R634 Abnormal weight loss: Secondary | ICD-10-CM | POA: Diagnosis not present

## 2022-12-06 DIAGNOSIS — I509 Heart failure, unspecified: Secondary | ICD-10-CM

## 2022-12-06 DIAGNOSIS — J9611 Chronic respiratory failure with hypoxia: Secondary | ICD-10-CM | POA: Diagnosis not present

## 2022-12-06 DIAGNOSIS — J449 Chronic obstructive pulmonary disease, unspecified: Secondary | ICD-10-CM | POA: Diagnosis not present

## 2022-12-06 DIAGNOSIS — C3490 Malignant neoplasm of unspecified part of unspecified bronchus or lung: Secondary | ICD-10-CM

## 2022-12-06 DIAGNOSIS — I5032 Chronic diastolic (congestive) heart failure: Secondary | ICD-10-CM

## 2022-12-06 DIAGNOSIS — J8410 Pulmonary fibrosis, unspecified: Secondary | ICD-10-CM

## 2022-12-06 DIAGNOSIS — R195 Other fecal abnormalities: Secondary | ICD-10-CM | POA: Diagnosis not present

## 2022-12-06 NOTE — Patient Instructions (Signed)
VISIT SUMMARY:  During today's visit, we reviewed your ongoing health conditions, including COPD, chronic respiratory failure, post-inflammatory pulmonary fibrosis, squamous cell carcinoma, and congestive heart failure. You reported that your symptoms are generally well-managed, and we discussed your current treatments and any recent changes.  YOUR PLAN:  -CHRONIC OBSTRUCTIVE PULMONARY DISEASE (COPD): COPD is a chronic lung condition that makes it hard to breathe. You are doing well overall with occasional cough after a recent bout of bronchitis. Your shortness of breath is manageable, and you are using your rescue inhaler less frequently. Continue using your Bevespi inhaler and monitor your rescue inhaler use.  -CHRONIC RESPIRATORY FAILURE WITH HYPOXIA: This condition means your lungs are not getting enough oxygen into your blood. Your current oxygen therapy is managing this well. Continue with your current oxygen therapy.  -POST-INFLAMMATORY PULMONARY FIBROSIS: This is a condition where lung tissue becomes scarred after inflammation. You have no new symptoms, so continue with your current management plan.  -SQUAMOUS CELL CARCINOMA OF THE LEFT UPPER LOBE: This is a type of lung cancer. You are following up at the cancer center and have a scheduled CT scan and appointment with Dr. Aggie Cosier next month. Continue with your follow-up at the cancer center.  -CONGESTIVE HEART FAILURE WITH PRESERVED EJECTION FRACTION (HFPEF): This type of heart failure means your heart pumps normally but is stiff when relaxing. You were hospitalized for this condition in September and are currently on Farxiga, which is effective. Despite the cost, it is important to continue taking [place]. We also discussed the possibility of reapplying for grants to help with the cost.  -GENERAL HEALTH MAINTENANCE: You received your flu shot. No additional actions are required at this time.  INSTRUCTIONS:  Please schedule a follow-up  appointment in 3-4 months.

## 2022-12-06 NOTE — Progress Notes (Signed)
Subjective:    Patient ID: Teresa Jensen, female    DOB: 17-Nov-1945, 77 y.o.   MRN: [id number]  Patient Care Team: Doreene Nest, NP as PCP - General (Internal Medicine) End, Cristal Deer, MD as PCP - Cardiology (Cardiology) Lonell Face, MD as Consulting Physician (Neurology) Galen Manila, MD as Referring Physician (Ophthalmology) Sandi Mealy, MD (Inactive) as Consulting Physician (Dermatology) Salena Saner, MD as Consulting Physician (Pulmonary Disease)  Chief Complaint  Patient presents with   Follow-up    No SOB, wheezing or cough.    BACKGROUND/INTERVAL:Patient is a 77 year old remote former smoker (30 PY) with a history as noted below who presents for follow-up on the issue of stage II COPD, postinflammatory pulmonary fibrosis and squamous cell carcinoma of the lung.  Patient follows from her visit of 07 May 2022.  This is a scheduled visit.   HPI Discussed the use of AI scribe software for clinical note transcription with the patient, who gave verbal consent to proceed.  History of Present Illness   The patient, with a history of COPD, post-inflammatory pulmonary fibrosis, chronic respiratory failure with hypoxia, and squamous cell carcinoma of the left upper lobe, presents for a follow-up visit. She reports a recent bout of bronchitis a couple of weeks ago, which was treated with antibiotics. The patient notes occasional coughing up of clear sputum since then. She describes her shortness of breath as 'not bad' and reports that her inhaler Eyvonne Left) is working well. She has not had to use her rescue inhaler frequently.   The patient had a hospital admission in September (5-7 Sept.), during which she was diagnosed with congestive heart failure. Despite this, she reports that her heart function was found to be normal. She was started on Farxiga for this condition. It was deemed that she has diastolic HF. The patient also uses supplemental oxygen, the use of  which she reports as satisfactory.  The patient is also under the care of a cancer center, with an upcoming appointment scheduled. She had a recent interaction with a home health company regarding an overnight oximetry test, which was completed and returned. The patient is also scheduled to see a gastroenterologist but expresses reluctance about undergoing a colonoscopy.      DATA 06/2010 PFTs: FEV1 1.20 (56%), FVC 1.77 L or 63% predicted, FEV1/FVC 68%, significant airtrapping (RV 207%), DLCO 46% but nearly corrects by alveolar volume. 08/30/2021 PFTs: FEV1 0.95 L or 85% predicted, FVC 1.41 L or 71% predicted, FEV1/FVC 67%, normal lung volumes, no significant response.  Moderate diffusion capacity impairment which corrects to alveolar volume. 10/30/2021 CT chest high resolution: 1.5 cm spiculated solid left upper lobe pulmonary nodule suspicious for bronchogenic carcinoma.  No thoracic adenopathy.  Moderate patchy air trapping in both lungs indicative of small airways disease.  Scattered thin parenchymal bands mild patchy subpleural reticulation without bronchiectasis or honeycombing.  Mostly compatible with postinfectious/postinflammatory scarring.  Bilateral pleural thickening without pleural effusions. 11/21/2021 PET/CT: Hypermetabolic left upper lobe pulmonary nodule.  Mildly metabolic pericarinal lymph node, favored to be reactive.  No hypermetabolic left hilar lymph nodes.  No evidence of hypermetabolic distant metastatic disease. 01/12/2022 [facility] protocol CT: Slightly increased left upper lobe pulmonary nodule previously noted to be hypermetabolic.  Stable mild subcarinal lymphadenopathy, nonspecific.  Stable left adrenal adenoma. 01/17/2022 robotic bronchoscopy + EBUS: Left upper lobe nodule positive for squamous cell carcinoma.  TBNA negative for malignancy on subcarinal nodes with sufficient lymph node material consistent with sampling of lymph node. 05/07/2022:  Ambulatory oximetry: Patient  ambulated 250 feet and desaturated to 82%.  Placed on 2 L/min continue to desaturate to 87%.  Required 4 L/min to maintain 90 to 91% O2 sats. 08/09/2022 PFTs: FEV1 0.79 L or 50% predicted, FVC 1.34 L or 62% predicted, FEV1/FVC 59%, lung volumes normal with mild to moderate air trapping evident.  There is severe diffusion defect.  Significant bronchodilator response.  Related to severe obstructive airways disease.  Study performed in 2023 there has been modest decline in function. 08/19/2022 overnight oximetry on oxygen at 3 L/min: Showing desaturation events 45 with ODI of 15 O2 nadir 87%, instructed to increase oxygen to 3-1/2 L/min during sleep.  Review of Systems A 10 point review of systems was performed and it is as noted above otherwise negative.   Patient Active Problem List   Diagnosis Date Noted   Urinary urgency 11/21/2022   Acute on chronic diastolic (congestive) heart failure (HCC) 09/06/2022   Allergic reaction to contrast dye 07/17/2022   Intermittent claudication (HCC) 07/04/2022   Lipodermatosclerosis of both lower extremities 02/22/2022   Solitary pulmonary nodule 11/27/2021   Chronic right shoulder pain 08/04/2021   Lower extremity pain 08/04/2021   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   Morbid obesity (HCC) 05/01/2021   Foot callus 09/01/2020   Rash and nonspecific skin eruption 07/07/2020   History of colonic polyps 11/01/2019   Bilateral carpal tunnel syndrome 08/16/2019   Chronic heart failure with preserved ejection fraction (HFpEF) (HCC) 11/12/2018   DOE (dyspnea on exertion) 11/12/2018   Osteoporosis 12/16/2017   Low vitamin B12 level 01/13/2017   Hypokalemia 01/12/2017   Essential hypertension 01/08/2017   Closed fracture of metatarsal bone 09/28/2016   Prediabetes 10/17/2015   Pain of right lower extremity 05/20/2015   Medicare annual wellness visit, subsequent 10/12/2014   Occipital neuralgia 10/12/2014   Vitamin D deficiency 10/12/2014    Erythema of lower extremity 09/08/2013   COPD GOLD 2  06/05/2010   Hypothyroidism 09/29/2009   Situational anxiety 10/01/2008   COPD 10/01/2008   GERD 10/01/2008   Osteoarthritis 10/01/2008    Social History   Tobacco Use   Smoking status: Former    Current packs/day: 0.00    Average packs/day: 1.5 packs/day for 20.0 years (30.0 ttl pk-yrs)    Types: Cigarettes    Start date: 02/11/1989    Quit date: 02/11/2009    Years since quitting: 13.8   Smokeless tobacco: Never  Substance Use Topics   Alcohol use: No    Alcohol/week: 0.0 standard drinks of alcohol    Allergies  Allergen Reactions   Epinephrine Anaphylaxis and Shortness Of Breath   Food Rash    MANGO = cause rashes around the mouth LIPS GO NUMB   Mangifera Indica Rash    THIS IS MANGO LIPS GO NUMB   Other Rash    MANGO = cause rashes around the mouth MANGO = cause rashes around the mouth LIPS GO NUMB   Prednisone Anaphylaxis, Hives, Shortness Of Breath, Itching and Swelling    The face swells HAS TAKEN CORTISONE SHOTS WITHOUT ISSUE BEFORE   Ivp Dye [Iodinated Contrast Media] Hives   Lactose Intolerance (Gi) Diarrhea   Pneumococcal 13-Val Conj Vacc Itching and Other (See Comments)    Arm temp elevated at site of injection, red, itching   Prevnar 13 [Pneumococcal 13-Val Conj Vacc] Other (See Comments)    Arm temp elevated at site of injection, red, itching    Current Meds  Medication Sig  alendronate (FOSAMAX) 70 MG tablet Take 70 mg by mouth once a week. Take with a full glass of water on an empty stomach.   atorvastatin (LIPITOR) 10 MG tablet Take 1 tablet (10 mg total) by mouth daily. for cholesterol.   Calcium Carb-Cholecalciferol (CALCIUM + VITAMIN D3 PO) Take 1,200 mg by mouth daily.   famotidine (PEPCID) 20 MG tablet TAKE 1 TABLET BY MOUTH AFTER SUPPER   FARXIGA 10 MG TABS tablet Take 1 tablet (10 mg total) by mouth daily before breakfast.   furosemide (LASIX) 40 MG tablet Take 1 tablet (40 mg total)  by mouth daily.   Glycopyrrolate-Formoterol (BEVESPI AEROSPHERE) 9-4.8 MCG/ACT AERO Inhale 2 puffs into the lungs 2 (two) times daily.   levalbuterol (XOPENEX HFA) 45 MCG/ACT inhaler Inhale 2 puffs into the lungs every 4 (four) hours as needed for wheezing or shortness of breath.   levothyroxine (SYNTHROID) 75 MCG tablet TAKE 1 EVERY AM ON AN EMPTY STOMACH WITH WATER ONLY. NO FOOD OR OTHER MEDICATIONS FOR 30 MINUTES.   losartan (COZAAR) 100 MG tablet TAKE 1 TABLET (100 MG TOTAL) BY MOUTH DAILY. PLEASE CALL [phone] TO SCHEDULE A YEARLY APPOINTMENT. THANK YOU.   Multiple Vitamins-Minerals (PRESERVISION AREDS 2 PO) Take 1 tablet by mouth in the morning and at bedtime.   pantoprazole (PROTONIX) 40 MG tablet Take 1 tablet (40 mg total) by mouth daily. for heartburn.   pentoxifylline (TRENTAL) 400 MG CR tablet TAKE 1 TABLET BY MOUTH 3 TIMES DAILY WITH MEALS.   spironolactone (ALDACTONE) 25 MG tablet Take 0.5 tablets (12.5 mg total) by mouth daily.   tacrolimus (PROTOPIC) 0.1 % ointment Apply to aa's lower legs BID PRN.    Immunization History  Administered Date(s) Administered   Fluad Quad(high Dose 65+) 09/02/2020, 10/12/2021   Fluad Trivalent(High Dose 65+) 10/19/2022   Influenza Whole 10/01/2008   Influenza,inj,Quad PF,6+ Mos 10/12/2014, 10/13/2015, 10/17/2016, 10/17/2017, 10/24/2018, 10/27/2019   PFIZER(Purple Top)SARS-COV-2 Vaccination 03/11/2019, 04/01/2019   Pneumococcal Conjugate-13 10/12/2014   Td 10/01/2008        Objective:     BP 124/80 (BP Location: Right Arm, Cuff Size: Normal)   Pulse 83   Temp (!) 97.1 F (36.2 C)   Ht 4\' 9"  (1.448 m)   Wt 168 lb (76.2 kg)   SpO2 98%   BMI 36.35 kg/m   SpO2: 98 % O2 Device: Nasal cannula O2 Flow Rate (L/min): 2.5 L/min O2 Type: Continuous O2  GENERAL: Obese woman, no acute distress.  Fully ambulatory, no conversational dyspnea. HEAD: Normocephalic, atraumatic.  EYES: Pupils equal, round, reactive to light.  No scleral  icterus.  MOUTH: Dentition intact, oral mucosa moist. NECK: Supple. No thyromegaly. Trachea midline. No JVD.  No adenopathy. PULMONARY: Good air entry bilaterally.  Coarse, otherwise, no adventitious sounds. CARDIOVASCULAR: S1 and S2. Regular rate and rhythm.  Grade 1/6 systolic ejection murmur LSB, no gallops or rubs noted. ABDOMEN: Obese, otherwise benign. MUSCULOSKELETAL: No joint deformity, no clubbing, no edema.  NEUROLOGIC: No overt focal deficit, no gait disturbance, speech is fluent. SKIN: Intact,warm,dry. PSYCH: Mood and behavior normal.  Assessment & Plan:     ICD-10-CM   1. Stage 2 moderate COPD by GOLD classification (HCC)  J44.9     2. Postinflammatory pulmonary fibrosis (HCC)  J84.10     3. Chronic respiratory failure with hypoxia (HCC)  J96.11     4. Squamous cell carcinoma of bronchus in left upper lobe (HCC)  C34.12     5. Chronic heart failure with preserved  ejection fraction (HFpEF) (HCC)  I50.32       Discussion:    Chronic Obstructive Pulmonary Disease (COPD) Doing well overall with occasional cough post-bronchitis treated with antibiotics. Shortness of breath is manageable, using rescue inhaler less frequently. Bevespi inhaler is effective. Lungs clear with no wheezes on examination. - Continue Bevespi inhaler - Check on Bevespi refill process - Monitor rescue inhaler use  Chronic Respiratory Failure with Hypoxia Well-managed on current oxygen therapy. Recent overnight oximetry results received. - Continue current oxygen therapy  Post-Inflammatory Pulmonary Fibrosis No new symptoms reported. - Continue current management  Squamous Cell Carcinoma of the Left Upper Lobe Following up at cancer center with scheduled CT and appointment with Dr. Rushie Chestnut next month. - Continue follow-up at cancer center  Congestive Heart Failure with Preserved Ejection Fraction (HFpEF) Hospitalized in September for HFpEF. Currently on Farxiga, which is effective but  expensive. Discussed importance of continuing Marcelline Deist despite cost concerns and potential for reapplying for grants. - Continue Farxiga  General Health Maintenance Up to date on influenza vaccine. - No additional actions required  Follow-up - Schedule follow-up appointment in 3-4 months.      Gailen Shelter, MD Advanced Bronchoscopy PCCM [facility]    *This note was generated using voice recognition software/Dragon and/or AI transcription program.  Despite best efforts to proofread, errors can occur which can change the meaning. Any transcriptional errors that result from this process are unintentional and may not be fully corrected at the time of dictation.

## 2022-12-06 NOTE — Patient Instructions (Addendum)
Your CT scan is schedule for 12/13/2022 arrive at 3:15pm for a  3:30pm CT scan at [facility]. If you need to reschedule please call [phone] option 3 and then option 2.

## 2022-12-10 ENCOUNTER — Encounter: Payer: Self-pay | Admitting: Dermatology

## 2022-12-10 ENCOUNTER — Ambulatory Visit (INDEPENDENT_AMBULATORY_CARE_PROVIDER_SITE_OTHER): Payer: Medicare Other | Admitting: Dermatology

## 2022-12-10 DIAGNOSIS — I872 Venous insufficiency (chronic) (peripheral): Secondary | ICD-10-CM | POA: Diagnosis not present

## 2022-12-10 DIAGNOSIS — M793 Panniculitis, unspecified: Secondary | ICD-10-CM

## 2022-12-10 MED ORDER — TACROLIMUS 0.1 % EX OINT
TOPICAL_OINTMENT | CUTANEOUS | 5 refills | Status: AC
Start: 2022-12-10 — End: ?

## 2022-12-10 NOTE — Patient Instructions (Signed)
Continue compression socks daily Continue pentoxifylline 400 mg twice daily, previously reduced from TID due to diarrhea Continue tacrolimus 1-2 times daily as needed Patient will contact us if worsening before follow up    Due to recent changes in healthcare laws, you may see results of your pathology and/or laboratory studies on MyChart before the doctors have had a chance to review them. We understand that in some cases there may be results that are confusing or concerning to you. Please understand that not all results are received at the same time and often the doctors may need to interpret multiple results in order to provide you with the best plan of care or course of treatment. Therefore, we ask that you please give [place] 2 business days to thoroughly review all your results before contacting the office for clarification. Should we see a critical lab result, you will be contacted sooner.   If You Need Anything After Your Visit  If you have any questions or concerns for your doctor, please call our main line at [phone] and press option 4 to reach your doctor's medical assistant. If no one answers, please leave a voicemail as directed and we will return your call as soon as possible. Messages left after 4 pm will be answered the following business day.   You may also send [place] a message via MyChart. We typically respond to MyChart messages within 1-2 business days.  For prescription refills, please ask your pharmacy to contact our office. Our fax number is [phone].  If you have an urgent issue when the clinic is closed that cannot wait until the next business day, you can [place] your doctor at the number below.    Please note that while we do our best to be available for urgent issues outside of office hours, we are not available 24/7.   If you have an urgent issue and are unable to reach [place], you may choose to seek medical care at your doctor's office, retail clinic, urgent care center,  or emergency room.  If you have a medical emergency, please immediately call 911 or go to the emergency department.  Pager Numbers  - Dr. Gwen Pounds: [phone]  - Dr. Roseanne Reno: [phone]  - Dr. Katrinka Blazing: [phone]   In the event of inclement weather, please call our main line at [phone] for an update on the status of any delays or closures.  Dermatology Medication Tips: Please keep the boxes that topical medications come in in order to help keep track of the instructions about where and how to use these. Pharmacies typically print the medication instructions only on the boxes and not directly on the medication tubes.   If your medication is too expensive, please contact our office at [phone] option 4 or send [place] a message through MyChart.   We are unable to tell what your co-pay for medications will be in advance as this is different depending on your insurance coverage. However, we may be able to find a substitute medication at lower cost or fill out paperwork to get insurance to cover a needed medication.   If a prior authorization is required to get your medication covered by your insurance company, please allow [place] 1-2 business days to complete this process.  Drug prices often vary depending on where the prescription is filled and some pharmacies may offer cheaper prices.  The website www.goodrx.com contains coupons for medications through different pharmacies. The prices here do not account for what the cost may be with  help from insurance (it may be cheaper with your insurance), but the website can give you the price if you did not use any insurance.  - You can print the associated coupon and take it with your prescription to the pharmacy.  - You may also stop by our office during regular business hours and pick up a GoodRx coupon card.  - If you need your prescription sent electronically to a different pharmacy, notify our office through [facility] or by phone  at [phone] option 4.     Si Usted Necesita Algo Despus de Su Visita  Tambin puede enviarnos un mensaje a travs de Clinical cytogeneticist. Por lo general respondemos a los mensajes de MyChart en el transcurso de 1 a 2 das hbiles.  Para renovar recetas, por favor pida a su farmacia que se ponga en contacto con nuestra oficina. Annie Sable de fax es [place] [phone].  Si tiene un asunto urgente cuando la clnica est cerrada y que no puede esperar hasta el siguiente da hbil, puede llamar/localizar a su doctor(a) al nmero que aparece a continuacin.   Por favor, tenga en cuenta que aunque hacemos todo lo posible para estar disponibles para asuntos urgentes fuera del horario de [place], no estamos disponibles las 24 horas del da, los 7 [address]  [address] de la [place].   Si tiene un problema urgente y no puede comunicarse con nosotros, puede optar por buscar atencin mdica  en el consultorio de su doctor(a), en una clnica privada, en un centro de atencin urgente o en una sala de emergencias.  Si tiene Engineer, drilling, por favor llame inmediatamente al 911 o vaya a la sala de emergencias.  Nmeros de bper  - Dr. Gwen Pounds: [phone]  - Dra. Roseanne Reno: [phone]  - Dr. Katrinka Blazing: [phone]   En caso de inclemencias del tiempo, por favor llame a Lacy Duverney principal al [phone] para una actualizacin sobre el [place] de cualquier retraso o cierre.  Consejos para la medicacin en dermatologa: Por favor, guarde las cajas en las que vienen los medicamentos de uso tpico para ayudarle a seguir las instrucciones sobre dnde y cmo usarlos. Las farmacias generalmente imprimen las instrucciones del medicamento slo en las cajas y no directamente en los tubos del [place].   Si su medicamento es muy caro, por favor, pngase en contacto con Rolm Gala llamando al [phone] y presione la opcin 4 o envenos un mensaje a travs de Clinical cytogeneticist.   No podemos decirle cul ser su copago por  los medicamentos por adelantado ya que esto es diferente dependiendo de la cobertura de su seguro. Sin embargo, es posible que podamos encontrar un medicamento sustituto a Audiological scientist un formulario para que el seguro cubra el medicamento que se considera necesario.   Si se requiere una autorizacin previa para que su compaa de seguros [place] su medicamento, por favor permtanos de 1 a 2 das hbiles para completar [address]  Los precios de los medicamentos varan con frecuencia dependiendo del Environmental consultant de dnde se surte la receta y alguna farmacias pueden ofrecer precios ms baratos.  El sitio web www.goodrx.com tiene cupones para medicamentos de Health and safety inspector. Los precios aqu no tienen en cuenta lo que podra costar con la ayuda del seguro (puede ser ms barato con su seguro), pero el sitio web puede darle el precio si no utiliz Tourist information centre manager.  - Puede imprimir el cupn correspondiente y llevarlo con su receta a la farmacia.  - Tambin puede pasar por [place] oficina durante  el horario de atencin regular y Education officer, museum una tarjeta de cupones de GoodRx.  - Si necesita que su receta se enve electrnicamente a una farmacia diferente, informe a nuestra oficina a travs de MyChart de [place] o por telfono llamando al [phone] y presione la opcin 4.

## 2022-12-10 NOTE — Progress Notes (Signed)
   Follow-Up Visit   Subjective  Teresa Jensen is a 77 y.o. female who presents for the following: lipodermatosclerosis. Patient using tacrolimus ointment 1-2 times daily. She states she is not sure how much the Tacrolimus is helping. She has stopped taking hydroxychloroquine 200 mg  as directed. She wears compression socks daily. Patient was unable to tolerate pentoxifylline QID. Stopped for 2 weeks then restarted; stopped due to belching, diarrhea and sleep disturbance. Reports these symptoms are not as bad as before. Is taking pentoxifylline 400 mg twice a day. States she received a letter from insurance stating the price of pentoxifylline will be going up in January.  Sometimes itchy. Sometimes painful  Patient was diagnosed with lung cancer in February and has completed radiation. Recent imaging shows stability to improvement in LUL lesion and plan is for 6 month follow up (01/24/23). Patient scheduled to have blood flow checked in lower legs.   The following portions of the chart were reviewed this encounter and updated as appropriate: medications, allergies, medical history  Review of Systems:  No other skin or systemic complaints except as noted in HPI or Assessment and Plan.  Objective  Well appearing patient in no apparent distress; mood and affect are within normal limits.   A focused examination was performed of the following areas: legs  Relevant exam findings are noted in the Assessment and Plan.     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 1+ pitting edema   Chronic illness with clinical stability   Plan: Continue compression socks daily, increase compression pressure as tolerated Continue pentoxifylline 400 mg twice daily, previously reduced from TID due to diarrhea Continue tacrolimus 1-2 times daily as needed Patient will  contact us if worsening before follow up  Stasis dermatitis of both legs  Related Medications tacrolimus (PROTOPIC) 0.1 % ointment Apply to aa's lower legs BID PRN.     Return in about 6 months (around 06/10/2023) for leg recheck.  I, Lawson Radar, CMA, am acting as scribe for Elie Goody, MD.    Documentation: I have reviewed the above documentation for accuracy and completeness, and I agree with the above.  Elie Goody, MD

## 2022-12-13 ENCOUNTER — Other Ambulatory Visit: Payer: Medicare Other

## 2022-12-17 ENCOUNTER — Telehealth: Payer: Self-pay

## 2022-12-17 NOTE — Telephone Encounter (Signed)
Patient portion received and scanned into chart and provider portion faxed to office for [employer] [facility]

## 2022-12-19 ENCOUNTER — Telehealth: Payer: Self-pay

## 2022-12-19 NOTE — Telephone Encounter (Signed)
Faxed Complete APP with Insurance cards to [employer]

## 2022-12-20 ENCOUNTER — Ambulatory Visit
Admission: RE | Admit: 2022-12-20 | Discharge: 2022-12-20 | Disposition: A | Discharge status: Home / Self Care | Payer: Medicare Other | Source: Ambulatory Visit | Attending: Physician Assistant | Admitting: Physician Assistant

## 2022-12-20 DIAGNOSIS — R195 Other fecal abnormalities: Secondary | ICD-10-CM | POA: Insufficient documentation

## 2022-12-20 DIAGNOSIS — R634 Abnormal weight loss: Secondary | ICD-10-CM | POA: Insufficient documentation

## 2022-12-25 ENCOUNTER — Encounter: Payer: Self-pay | Admitting: Physician Assistant

## 2022-12-31 ENCOUNTER — Encounter: Payer: Self-pay | Admitting: Pulmonary Disease

## 2022-12-31 NOTE — Telephone Encounter (Signed)
To my knowledge this is not a requirement.

## 2023-01-07 ENCOUNTER — Encounter: Payer: Medicare Other | Admitting: Family

## 2023-01-07 ENCOUNTER — Telehealth: Payer: Self-pay

## 2023-01-07 NOTE — Telephone Encounter (Signed)
 Left message to call office-   Call and notify patient abdominal pelvic CT shows:  1.  No acute abnormality in the abdomen or pelvis.  No evidence of cancer or masses.  This is great news!  2.  Incidental diverticulosis with no evidence of diverticulitis.  3.  Stable 14 mm left adrenal gland nodule, unchanged from 11/2021 imaging.  Recommend adrenal gland MRI for further evaluation.  4.  Please ask patient again if she wants to schedule a colonoscopy.  Diagnosis positive Cologuard and weight loss.  She will need pulmonary and cardiac clearance.  History of COPD on oxygen, lung cancer, and CHF.  Pulmonologist Dr. Tamea.  Ellouise Console, PA-C

## 2023-01-08 ENCOUNTER — Telehealth: Payer: Self-pay | Admitting: Internal Medicine

## 2023-01-08 NOTE — Telephone Encounter (Signed)
 Schedule OPEN MRI-patient will not go to [facility]- cardiac and pulmonary clearance is needed-patient will call back after her appointment with [facility] failure clinic and let me know if she wants to proceed with colonoscopy.   Will also need to order MRI as well.  Clearances have been faxed to the below providers.  Dr.Christopher End Cardiac Dr.Carmen Gonzalez-pulmonary  Once clearances are received- sched COLONOSCOPY--- with Golytely  prep

## 2023-01-08 NOTE — Telephone Encounter (Signed)
   Pre-operative Risk Assessment    Patient Name: Teresa Jensen  DOB: [birth year] MRN: [id number]  Date of last office visit: 10/31/22 Date of next office visit: 10/2023   Request for Surgical Clearance    Procedure:   COLONOSCOPY  Date of Surgery:  Clearance TBD                               Surgeon:  NOT LISTED Surgeon's Group or Practice Name:  [facility] Phone number:  [phone] Fax number:  [phone]  Type of Clearance Requested:   - Medical    Type of Anesthesia:  General   Additional requests/questions:    SignedSamule LITTIE Bristol   01/08/2023, 9:49 AM

## 2023-01-09 NOTE — Progress Notes (Deleted)
 Advanced Heart Failure Clinic Note   PCP: Gretta Comer POUR, NP (last seen 09/24) Cardiologist: Lonni Hanson, MD (last seen 11/23; to be seen 11/24)  Chief Complaint:  HPI:  Ms Beighley is a 78 y/o female with a history of CAD, COPD, lung cancer, HTN, adenoma of left adrenal gland, lung cancer, anemia, anxiety, diverticulosis, GERD, hypothyroidism, bilateral iliac artery stenosis, left pulmonary nodule, polymyalgia rheumatica, tobacco use and chronic heart failure.   Admitted 09/06/22 due to bilateral leg edema, weight gain, shortness of breath with exertion, orthopnea. Chest x-ray showed vascular congestion, patient has mild elevation of BNP.  atient also had worsening hypoxemia on 3 L oxygen, was placed on 6 L oxygen. Given IV lasix  with improvement and oxygen weaned back to baseline 3L. Echocardiogram showed ejection fraction 55 to 60% indeterminate diastolic dysfunction. Mild pulmonary artery hypertension. Hypokalemia corrected. Venous ultrasound was negative for DVT.   Echo 06/28/11: EF 55-60% with Grade I DD and mild AR Echo 01/15/13: EF 55-60% with mild AR Echo 02/16/18: EF 60-65%  Echo 09/07/22: EF 55-60% with mildly elevated PA pressure, mild LAE and mild MR  She presents today for a HF f/u visit with a chief complaint of moderate fatigue with little exertion. Chronic in nature. Has associated shortness of breath and chronic difficulty sleeping (due to urination) along with this. She says that taking a shower will wear her out. Denies chest pain, palpitations, abdominal distention, pedal edema or dizziness. Wearing oxygen at 2.5 L during day and 3L at night. She says that she has difficulty sleeping because she is up all night urinating. Doesn't drinks fluids late in the evening but does eat fruit in the evening.   At last visit, farxiga  10mg  was started. She has recently noticed a slight vaginal discharge and a little bit of itching.    Takes furosemide  PRN based on weight and last  took lasix  ~ 1 week ago  Drinking 2 bottles of water (32 oz) daily with 2 cups of coffee and small gingerale daily.   ROS: All systems negative except as listed in HPI, PMH and Problem List.  SH:  Social History   Socioeconomic History   Marital status: Married    Spouse name: [name]   Number of children: 1   Years of education: Not on file   Highest education level: Not on file  Occupational History   Occupation: Catering Manager as a Paramedic: RETIRED    Comment: retired  Tobacco Use   Smoking status: Former    Current packs/day: 0.00    Average packs/day: 1.5 packs/day for 20.0 years (30.0 ttl pk-yrs)    Types: Cigarettes    Start date: 02/11/1989    Quit date: 02/11/2009    Years since quitting: 13.9   Smokeless tobacco: Never  Vaping Use   Vaping status: Never Used  Substance and Sexual Activity   Alcohol use: No    Alcohol/week: 0.0 standard drinks of alcohol   Drug use: No   Sexual activity: Not Currently  Other Topics Concern   Not on file  Social History Narrative   Lives in [place] with her husband.  Takes care of her two grandchildren, ages 6 and 2, everyday in [place].  Has one son who is 3.   Desires CPR   Would not want prolonged life support if futile   Social Drivers of Health   Financial Resource Strain: Low Risk  (05/07/2022)   Overall Physicist, Medical Strain (  CARDIA)    Difficulty of Paying Living Expenses: Not hard at all  Food Insecurity: No Food Insecurity (09/07/2022)   Hunger Vital Sign    Worried About Running Out of Food in the Last Year: Never true    Ran Out of Food in the Last Year: Never true  Transportation Needs: No Transportation Needs (09/07/2022)   PRAPARE - Administrator, Civil Service (Medical): No    Lack of Transportation (Non-Medical): No  Physical Activity: Insufficiently Active (05/07/2022)   Exercise Vital Sign    Days of Exercise per Week: 3 days    Minutes of Exercise per Session: 30 min   Stress: No Stress Concern Present (05/07/2022)   [employer] of Occupational Health - Occupational Stress Questionnaire    Feeling of Stress : Not at all  Social Connections: Moderately Integrated (05/07/2022)   Social Connection and Isolation Panel [NHANES]    Frequency of Communication with Friends and Family: More than three times a week    Frequency of Social Gatherings with Friends and Family: More than three times a week    Attends Religious Services: More than 4 times per year    Active Member of Clubs or Organizations: No    Attends Banker Meetings: Never    Marital Status: Married  Catering Manager Violence: Not At Risk (09/07/2022)   Humiliation, Afraid, Rape, and Kick questionnaire    Fear of Current or Ex-Partner: No    Emotionally Abused: No    Physically Abused: No    Sexually Abused: No    FH:  Family History  Problem Relation Age of Onset   Heart attack Mother 5   Cancer Father        lung/chest wall    Prostate cancer Father    Diabetes Brother        siblings   Hypertension Sister        x 2   Heart disease Brother    Breast cancer Paternal Grandmother    Heart disease Sister    Diabetes Sister    Tuberculosis Sister    Cirrhosis Brother     Past Medical History:  Diagnosis Date   (HFpEF) heart failure with preserved ejection fraction (HCC) 06/28/2011   a.) TTE 06/28/2011: EF 55-60%, mild AR, G1DD; b.) TTE 01/16/2013: EF 55-60%, mild MR; c.) TTE 02/16/2018: EF 60-65%, G1DD; d.) MPI 11/26/2018: EF 56%   Abnormal electrocardiogram 11/12/2018   Acute kidney failure (HCC) 01/12/2017   Acute on chronic respiratory failure with hypoxia (HCC) 09/06/2022   Adenoma of left adrenal gland    Anemia    Anxiety    Aortic atherosclerosis (HCC)    Bilateral carpal tunnel syndrome    CAD (coronary artery disease)    a.) HR CT chest 10/30/2021: 2 vessel CAD (LAD/RCA)   CAP (community acquired pneumonia)    COPD (chronic obstructive pulmonary  disease) (HCC)    COPD exacerbation (HCC) 01/21/2018   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   Diverticulosis    Dyspnea    DUE TO HEAT WITH COPD   Epistaxis 01/09/2017   GERD (gastroesophageal reflux disease)    Herpes zoster 07/08/2015   Hypertension    Hypothyroidism    Iatrogenic hypotension 01/12/2017   Iliac artery stenosis, bilateral (HCC)    Morbid obesity with BMI of 40.0-44.9, adult (HCC)    New onset of headaches    Osteopenia    PMR (polymyalgia rheumatica) (HCC)    Pneumonia 02/13/2018  PONV (postoperative nausea and vomiting)    Pulmonary nodule, left    a.) CT chest 10/30/2021: 1.5 cm medial LUL nodule; b.) PET CT 11/21/2021: hypermetabolic LUL nodule (SUX max 9.3)   S/P carpal tunnel release 08/04/2020   Shingles    Stasis dermatitis    Strain of chest wall 01/20/2011   Tobacco abuse    Vitamin D  deficiency     Current Outpatient Medications  Medication Sig Dispense Refill   alendronate  (FOSAMAX ) 70 MG tablet Take 70 mg by mouth once a week. Take with a full glass of water on an empty stomach.     atorvastatin  (LIPITOR) 10 MG tablet Take 1 tablet (10 mg total) by mouth daily. for cholesterol. 90 tablet 1   Calcium  Carb-Cholecalciferol (CALCIUM  + VITAMIN D3 PO) Take 1,200 mg by mouth daily.     clobetasol  cream (TEMOVATE ) 0.05 % Apply 1 Application topically 2 (two) times daily. 60 each 0   famotidine  (PEPCID ) 20 MG tablet TAKE 1 TABLET BY MOUTH AFTER SUPPER 90 tablet 3   FARXIGA  10 MG TABS tablet Take 1 tablet (10 mg total) by mouth daily before breakfast. 30 tablet 6   furosemide  (LASIX ) 40 MG tablet Take 1 tablet (40 mg total) by mouth daily. 30 tablet 0   Glycopyrrolate -Formoterol  (BEVESPI  AEROSPHERE) 9-4.8 MCG/ACT AERO Inhale 2 puffs into the lungs 2 (two) times daily. 3 each 3   levalbuterol  (XOPENEX  HFA) 45 MCG/ACT inhaler Inhale 2 puffs into the lungs every 4 (four) hours as needed for wheezing or shortness of breath. 1 each 12   levothyroxine   (SYNTHROID ) 75 MCG tablet TAKE 1 EVERY AM ON AN EMPTY STOMACH WITH WATER ONLY. NO FOOD OR OTHER MEDICATIONS FOR 30 MINUTES. 90 tablet 2   losartan  (COZAAR ) 100 MG tablet TAKE 1 TABLET (100 MG TOTAL) BY MOUTH DAILY. PLEASE CALL [phone] TO SCHEDULE A YEARLY APPOINTMENT. THANK YOU. 90 tablet 3   Multiple Vitamins-Minerals (PRESERVISION AREDS 2 PO) Take 1 tablet by mouth in the morning and at bedtime.     pantoprazole  (PROTONIX ) 40 MG tablet Take 1 tablet (40 mg total) by mouth daily. for heartburn. 90 tablet 3   pentoxifylline  (TRENTAL ) 400 MG CR tablet TAKE 1 TABLET BY MOUTH 3 TIMES DAILY WITH MEALS. 270 tablet 1   spironolactone  (ALDACTONE ) 25 MG tablet Take 0.5 tablets (12.5 mg total) by mouth daily. 45 tablet 3   tacrolimus  (PROTOPIC ) 0.1 % ointment Apply to aa's lower legs BID PRN. 60 g 5   No current facility-administered medications for this visit.   There were no vitals filed for this visit.  Wt Readings from Last 3 Encounters:  12/06/22 167 lb 8 oz (76 kg)  12/06/22 168 lb (76.2 kg)  11/21/22 169 lb (76.7 kg)   Lab Results  Component Value Date   CREATININE 0.98 10/17/2022   CREATININE 1.03 (H) 10/08/2022   CREATININE 1.06 09/21/2022   PHYSICAL EXAM:  General:  Well appearing. No resp difficulty HEENT: normal Neck: supple. JVP flat. No lymphadenopathy or thryomegaly appreciated. Cor: PMI normal. Regular rate & rhythm. No rubs, gallops or murmurs. Lungs: clear Abdomen: soft, nontender, nondistended. No hepatosplenomegaly. No bruits or masses.  Extremities: no cyanosis, clubbing, rash, edema Neuro: alert & orientedx3, cranial nerves grossly intact. Moves all 4 extremities w/o difficulty. Affect pleasant.   ECG: 09/06/22 was NSR with premature supraventricular complexes, HR 96   ASSESSMENT & PLAN:  1: NICM with preserved ejection fraction- - suspect due to COPD  - NYHA class  II - euvolemic - weighing daily; reviewed to call for an overnight weight gain of > 2 pounds  or a weekly weight gain of > 5 pounds - weight down 7 pounds from last visit here 3 weeks ago - Echo 06/28/11: EF 55-60% with Grade I DD and mild AR - Echo 01/15/13: EF 55-60% with mild AR - Echo 02/16/18: EF 60-65%  - Echo 09/07/22: EF 55-60% with mildly elevated PA pressure, mild LAE and mild MR - saw cardiology Dene) 11/23; returns 11/24 - continue losartan  100mg  daily - continue spironolactone  25mg  daily; refilled today - continue furosemide  40mg  PRN; last took this ~ 1 week ago - continue farxiga  10mg  daily - BMP today since started farxiga  at last visit - Mg 09/08/22 was 1.7 - BNP 09/06/22 was 237.0  2: HTN- - BP 130/30 - saw PCP Domenick) 09/24 - BMP 09/13/22 showed sodium 141, potassium 4.0, creatinine 1.29 & GFR 40.04 - BMP today  3: Stage II COPD- - saw pulmonology Herlene) 08/24 - wearing oxygen at 2.5L during the day and 3L at bedtime  - quit smoking ~ 2009; smoked 1.5 ppd at her peak  4: Yeast infection- - has slight vaginal itching/ discharge since starting farxiga  - will treat with diflucan  150mg  X 1 - if symptoms persist or reoccur, may need to stop farxiga   Return in 3 months, sooner if needed.

## 2023-01-10 ENCOUNTER — Encounter: Payer: Medicare Other | Admitting: Family

## 2023-01-10 ENCOUNTER — Encounter: Payer: Self-pay | Admitting: Family

## 2023-01-14 ENCOUNTER — Ambulatory Visit: Payer: Medicare Other

## 2023-01-15 ENCOUNTER — Telehealth: Payer: Self-pay

## 2023-01-15 ENCOUNTER — Ambulatory Visit
Admission: RE | Admit: 2023-01-15 | Discharge: 2023-01-15 | Disposition: A | Discharge status: Home / Self Care | Payer: Medicare Other | Source: Ambulatory Visit | Attending: Radiation Oncology | Admitting: Radiation Oncology

## 2023-01-15 DIAGNOSIS — C3412 Malignant neoplasm of upper lobe, left bronchus or lung: Secondary | ICD-10-CM | POA: Insufficient documentation

## 2023-01-15 NOTE — Telephone Encounter (Signed)
 Left message to see if patient would like to move forward with colonoscopy and MRI.    Call and notify patient abdominal pelvic CT shows:  1.  No acute abnormality in the abdomen or pelvis.  No evidence of cancer or masses.  This is great news!  2.  Incidental diverticulosis with no evidence of diverticulitis.  3.  Stable 14 mm left adrenal gland nodule, unchanged from 11/2021 imaging.  Recommend adrenal gland MRI for further evaluation.  4.  Please ask patient again if she wants to schedule a colonoscopy.  Diagnosis positive Cologuard and weight loss.  She will need pulmonary and cardiac clearance.  History of COPD on oxygen, lung cancer, and CHF.  Pulmonologist Dr. Tamea.  Ellouise Console, PA-C

## 2023-01-16 NOTE — Progress Notes (Signed)
Advanced Heart Failure Clinic Note   PCP: Doreene Nest, NP (last seen 11/24) Cardiologist: Yvonne Kendall, MD/ Terrilee Croak, PA (last seen 10/24)  Chief Complaint: shortness of breath  HPI:  Teresa Jensen is a 78 y/o female with a history of CAD, COPD, lung cancer, HTN, adenoma of left adrenal gland, lung cancer, anemia, anxiety, diverticulosis, GERD, hypothyroidism, bilateral iliac artery stenosis, left pulmonary nodule, polymyalgia rheumatica, tobacco use and chronic heart failure.   Admitted 09/06/22 due to bilateral leg edema, weight gain, shortness of breath with exertion, orthopnea. Chest x-ray showed vascular congestion, patient has mild elevation of BNP.  atient also had worsening hypoxemia on 3 L oxygen, was placed on 6 L oxygen. Given IV lasix with improvement and oxygen weaned back to baseline 3L. Echocardiogram showed ejection fraction 55 to 60% indeterminate diastolic dysfunction. Mild pulmonary artery hypertension. Hypokalemia corrected. Venous ultrasound was negative for DVT.   Echo 06/28/11: EF 55-60% with Grade I DD and mild AR Echo 01/15/13: EF 55-60% with mild AR Echo 02/16/18: EF 60-65%  Echo 09/07/22: EF 55-60% with mildly elevated PA pressure, mild LAE and mild MR  She presents today for a HF f/u visit with a chief complaint of moderate shortness of breath with minimal exertion. Has associated cough, occasional palpitations, bilateral feet pain/ tightness and decreased appetite along with this. Denies chest pain, abdominal distention, pedal edema or weight gain. Says that she occasionally gets a burning sensation over the front of her left chest. Resolves quickly without any radiation anywhere. Feels like her breathing is getting worse. Taking her furosemide PRN.   Wearing oxygen at 2.5L around the clock. Is asking about whether she should purse getting a colonoscopy done or not. Reports not having any interest in eating and has had subsequent weight loss.  ROS: All  systems negative except as listed in HPI, PMH and Problem List.  SH:  Social History   Socioeconomic History   Marital status: Married    Spouse name: [name]   Number of children: 1   Years of education: Not on file   Highest education level: Not on file  Occupational History   Occupation: Catering manager as a Paramedic: RETIRED    Comment: retired  Tobacco Use   Smoking status: Former    Current packs/day: 0.00    Average packs/day: 1.5 packs/day for 20.0 years (30.0 ttl pk-yrs)    Types: Cigarettes    Start date: 02/11/1989    Quit date: 02/11/2009    Years since quitting: 13.9   Smokeless tobacco: Never  Vaping Use   Vaping status: Never Used  Substance and Sexual Activity   Alcohol use: No    Alcohol/week: 0.0 standard drinks of alcohol   Drug use: No   Sexual activity: Not Currently  Other Topics Concern   Not on file  Social History Narrative   Lives in [place] with her husband.  Takes care of her two grandchildren, ages 57 and 2, everyday in [place].  Has one son who is 28.   Desires CPR   Would not want prolonged life support if futile   Social Drivers of Corporate investment banker Strain: Low Risk  (05/07/2022)   Overall Financial Resource Strain (CARDIA)    Difficulty of Paying Living Expenses: Not hard at all  Food Insecurity: No Food Insecurity (09/07/2022)   Hunger Vital Sign    Worried About Running Out of Food in the Last Year: Never true  Ran Out of Food in the Last Year: Never true  Transportation Needs: No Transportation Needs (09/07/2022)   PRAPARE - Administrator, Civil Service (Medical): No    Lack of Transportation (Non-Medical): No  Physical Activity: Insufficiently Active (05/07/2022)   Exercise Vital Sign    Days of Exercise per Week: 3 days    Minutes of Exercise per Session: 30 min  Stress: No Stress Concern Present (05/07/2022)   [employer] of Occupational Health - Occupational Stress Questionnaire     Feeling of Stress : Not at all  Social Connections: Moderately Integrated (05/07/2022)   Social Connection and Isolation Panel [NHANES]    Frequency of Communication with Friends and Family: More than three times a week    Frequency of Social Gatherings with Friends and Family: More than three times a week    Attends Religious Services: More than 4 times per year    Active Member of Clubs or Organizations: No    Attends Banker Meetings: Never    Marital Status: Married  Catering manager Violence: Not At Risk (09/07/2022)   Humiliation, Afraid, Rape, and Kick questionnaire    Fear of Current or Ex-Partner: No    Emotionally Abused: No    Physically Abused: No    Sexually Abused: No    FH:  Family History  Problem Relation Age of Onset   Heart attack Mother 57   Cancer Father        lung/chest wall    Prostate cancer Father    Diabetes Brother        siblings   Hypertension Sister        x 2   Heart disease Brother    Breast cancer Paternal Grandmother    Heart disease Sister    Diabetes Sister    Tuberculosis Sister    Cirrhosis Brother     Past Medical History:  Diagnosis Date   (HFpEF) heart failure with preserved ejection fraction (HCC) 06/28/2011   a.) TTE 06/28/2011: EF 55-60%, mild AR, G1DD; b.) TTE 01/16/2013: EF 55-60%, mild MR; c.) TTE 02/16/2018: EF 60-65%, G1DD; d.) MPI 11/26/2018: EF 56%   Abnormal electrocardiogram 11/12/2018   Acute kidney failure (HCC) 01/12/2017   Acute on chronic respiratory failure with hypoxia (HCC) 09/06/2022   Adenoma of left adrenal gland    Anemia    Anxiety    Aortic atherosclerosis (HCC)    Bilateral carpal tunnel syndrome    CAD (coronary artery disease)    a.) HR CT chest 10/30/2021: 2 vessel CAD (LAD/RCA)   CAP (community acquired pneumonia)    COPD (chronic obstructive pulmonary disease) (HCC)    COPD exacerbation (HCC) 01/21/2018   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   Diverticulosis    Dyspnea    DUE  TO HEAT WITH COPD   Epistaxis 01/09/2017   GERD (gastroesophageal reflux disease)    Herpes zoster 07/08/2015   Hypertension    Hypothyroidism    Iatrogenic hypotension 01/12/2017   Iliac artery stenosis, bilateral (HCC)    Morbid obesity with BMI of 40.0-44.9, adult (HCC)    New onset of headaches    Osteopenia    PMR (polymyalgia rheumatica) (HCC)    Pneumonia 02/13/2018   PONV (postoperative nausea and vomiting)    Pulmonary nodule, left    a.) CT chest 10/30/2021: 1.5 cm medial LUL nodule; b.) PET CT 11/21/2021: hypermetabolic LUL nodule (SUX max 9.3)   S/P carpal tunnel release 08/04/2020  Shingles    Stasis dermatitis    Strain of chest wall 01/20/2011   Tobacco abuse    Vitamin D deficiency     Current Outpatient Medications  Medication Sig Dispense Refill   alendronate (FOSAMAX) 70 MG tablet Take 70 mg by mouth once a week. Take with a full glass of water on an empty stomach.     atorvastatin (LIPITOR) 10 MG tablet Take 1 tablet (10 mg total) by mouth daily. for cholesterol. 90 tablet 1   Calcium Carb-Cholecalciferol (CALCIUM + VITAMIN D3 PO) Take 1,200 mg by mouth daily.     clobetasol cream (TEMOVATE) 0.05 % Apply 1 Application topically 2 (two) times daily. 60 each 0   famotidine (PEPCID) 20 MG tablet TAKE 1 TABLET BY MOUTH AFTER SUPPER 90 tablet 3   FARXIGA 10 MG TABS tablet Take 1 tablet (10 mg total) by mouth daily before breakfast. 30 tablet 6   furosemide (LASIX) 40 MG tablet Take 1 tablet (40 mg total) by mouth daily. 30 tablet 0   Glycopyrrolate-Formoterol (BEVESPI AEROSPHERE) 9-4.8 MCG/ACT AERO Inhale 2 puffs into the lungs 2 (two) times daily. 3 each 3   levalbuterol (XOPENEX HFA) 45 MCG/ACT inhaler Inhale 2 puffs into the lungs every 4 (four) hours as needed for wheezing or shortness of breath. 1 each 12   levothyroxine (SYNTHROID) 75 MCG tablet TAKE 1 EVERY AM ON AN EMPTY STOMACH WITH WATER ONLY. NO FOOD OR OTHER MEDICATIONS FOR 30 MINUTES. 90 tablet 2    losartan (COZAAR) 100 MG tablet TAKE 1 TABLET (100 MG TOTAL) BY MOUTH DAILY. PLEASE CALL [phone] TO SCHEDULE A YEARLY APPOINTMENT. THANK YOU. 90 tablet 3   Multiple Vitamins-Minerals (PRESERVISION AREDS 2 PO) Take 1 tablet by mouth in the morning and at bedtime.     pantoprazole (PROTONIX) 40 MG tablet Take 1 tablet (40 mg total) by mouth daily. for heartburn. 90 tablet 3   pentoxifylline (TRENTAL) 400 MG CR tablet TAKE 1 TABLET BY MOUTH 3 TIMES DAILY WITH MEALS. 270 tablet 1   spironolactone (ALDACTONE) 25 MG tablet Take 0.5 tablets (12.5 mg total) by mouth daily. 45 tablet 3   tacrolimus (PROTOPIC) 0.1 % ointment Apply to aa's lower legs BID PRN. 60 g 5   No current facility-administered medications for this visit.   Vitals:   01/18/23 0911  BP: (!) 142/47  Pulse: 76  SpO2: 97%  Weight: 163 lb 6.4 oz (74.1 kg)   Wt Readings from Last 3 Encounters:  01/18/23 163 lb 6.4 oz (74.1 kg)  12/06/22 167 lb 8 oz (76 kg)  12/06/22 168 lb (76.2 kg)   Lab Results  Component Value Date   CREATININE 0.98 10/17/2022   CREATININE 1.03 (H) 10/08/2022   CREATININE 1.06 09/21/2022     PHYSICAL EXAM:  General:  Well appearing. No resp difficulty HEENT: normal Neck: supple. JVP flat. No lymphadenopathy or thryomegaly appreciated. Cor: PMI normal. Regular rate & rhythm. No rubs, gallops or murmurs. Lungs: clear except for few expiratory wheezes in LLQ Abdomen: soft, nontender, nondistended. No hepatosplenomegaly. No bruits or masses.  Extremities: no cyanosis, clubbing, rash, edema Neuro: alert & orientedx3, cranial nerves grossly intact. Moves all 4 extremities w/o difficulty. Affect pleasant.   ECG: not done   ASSESSMENT & PLAN:  1: NICM with preserved ejection fraction- - suspect etiology due to COPD  - NYHA class III - euvolemic - weighing daily; reviewed to call for an overnight weight gain of > 2 pounds or a weekly  weight gain of > 5 pounds - weight down 11 pounds from last  visit here 3 months ago - Echo 06/28/11: EF 55-60% with Grade I DD and mild AR - Echo 01/15/13: EF 55-60% with mild AR - Echo 02/16/18: EF 60-65%  - Echo 09/07/22: EF 55-60% with mildly elevated PA pressure, mild LAE and mild MR - continue farxiga 10mg  daily - continue furosemide 40mg  PRN - continue losartan 100mg  daily - continue spironolactone 12.5mg  daily - saw cardiology Fransico Michael) 10/24 - Mg 09/08/22 was 1.7 - BNP 09/06/22 was 237.0  2: HTN- - BP 142/47 - saw PCP Chestine Spore) 11/24 - BMP 10/17/22 showed sodium 138, potassium 4.1, creatinine 0.98 & GFR 59  3: Stage II COPD- - saw pulmonology Jayme Cloud) 12/24 - wearing oxygen at 2.5L during the day and 3L at bedtime  - quit smoking ~ 2009; smoked 1.5 ppd at her peak  4: Lung [place]- - had recent chest CT & has upcoming visit with Dr Marena Chancy to discuss results   In regards to whether she should pursue colonoscopy, I asked her to think about whether she would pursue treatment if she found out that she had colon cancer as that would then guide her decision. If she decided to pursue colonoscopy, she would definitely need clearance from pulmonology.    Return here in 4 months, sooner if needed.

## 2023-01-18 ENCOUNTER — Encounter: Payer: Self-pay | Admitting: Family

## 2023-01-18 ENCOUNTER — Ambulatory Visit: Payer: Medicare Other | Attending: Family | Admitting: Family

## 2023-01-18 VITALS — BP 142/47 | HR 76 | Wt 163.4 lb

## 2023-01-18 DIAGNOSIS — I2510 Atherosclerotic heart disease of native coronary artery without angina pectoris: Secondary | ICD-10-CM | POA: Insufficient documentation

## 2023-01-18 DIAGNOSIS — I110 Hypertensive heart disease with heart failure: Secondary | ICD-10-CM | POA: Diagnosis not present

## 2023-01-18 DIAGNOSIS — I428 Other cardiomyopathies: Secondary | ICD-10-CM | POA: Diagnosis not present

## 2023-01-18 DIAGNOSIS — M79671 Pain in right foot: Secondary | ICD-10-CM | POA: Insufficient documentation

## 2023-01-18 DIAGNOSIS — Z79899 Other long term (current) drug therapy: Secondary | ICD-10-CM | POA: Insufficient documentation

## 2023-01-18 DIAGNOSIS — Z860101 Personal history of adenomatous and serrated colon polyps: Secondary | ICD-10-CM | POA: Insufficient documentation

## 2023-01-18 DIAGNOSIS — E039 Hypothyroidism, unspecified: Secondary | ICD-10-CM | POA: Insufficient documentation

## 2023-01-18 DIAGNOSIS — J449 Chronic obstructive pulmonary disease, unspecified: Secondary | ICD-10-CM | POA: Insufficient documentation

## 2023-01-18 DIAGNOSIS — I5032 Chronic diastolic (congestive) heart failure: Secondary | ICD-10-CM | POA: Insufficient documentation

## 2023-01-18 DIAGNOSIS — M79672 Pain in left foot: Secondary | ICD-10-CM | POA: Insufficient documentation

## 2023-01-18 DIAGNOSIS — C3412 Malignant neoplasm of upper lobe, left bronchus or lung: Secondary | ICD-10-CM | POA: Diagnosis not present

## 2023-01-18 DIAGNOSIS — R002 Palpitations: Secondary | ICD-10-CM | POA: Insufficient documentation

## 2023-01-18 DIAGNOSIS — Z87891 Personal history of nicotine dependence: Secondary | ICD-10-CM | POA: Insufficient documentation

## 2023-01-18 DIAGNOSIS — Z86018 Personal history of other benign neoplasm: Secondary | ICD-10-CM | POA: Insufficient documentation

## 2023-01-18 DIAGNOSIS — R059 Cough, unspecified: Secondary | ICD-10-CM | POA: Insufficient documentation

## 2023-01-18 DIAGNOSIS — M353 Polymyalgia rheumatica: Secondary | ICD-10-CM | POA: Diagnosis not present

## 2023-01-18 DIAGNOSIS — R0602 Shortness of breath: Secondary | ICD-10-CM | POA: Diagnosis present

## 2023-01-18 DIAGNOSIS — Z9981 Dependence on supplemental oxygen: Secondary | ICD-10-CM | POA: Diagnosis not present

## 2023-01-18 DIAGNOSIS — I10 Essential (primary) hypertension: Secondary | ICD-10-CM | POA: Diagnosis not present

## 2023-01-18 NOTE — Patient Instructions (Signed)
It was great seeing you today.  Keep up the good work.

## 2023-01-21 ENCOUNTER — Telehealth: Payer: Self-pay | Admitting: *Deleted

## 2023-01-21 ENCOUNTER — Telehealth: Payer: Self-pay

## 2023-01-21 NOTE — Telephone Encounter (Signed)
Left message to call back to schedule tele pre op appt.  

## 2023-01-21 NOTE — Telephone Encounter (Signed)
Pt has been scheduled tele preop appt 02/01/23. Med rec and consent are done. Surgeon office wants clearance before scheduling per pt.      Patient Consent for Virtual Visit        Teresa Jensen has provided verbal consent on 01/21/2023 for a virtual visit (video or telephone).   CONSENT FOR VIRTUAL VISIT FOR:  Teresa Jensen  By participating in this virtual visit I agree to the following:  I hereby voluntarily request, consent and authorize [facility] and its employed or contracted physicians, physician assistants, nurse practitioners or other licensed health care professionals (the Practitioner), to provide me with telemedicine health care services (the "Services") as deemed necessary by the treating Practitioner. I acknowledge and consent to receive the Services by the Practitioner via telemedicine. I understand that the telemedicine visit will involve communicating with the Practitioner through live audiovisual communication technology and the disclosure of certain medical information by electronic transmission. I acknowledge that I have been given the opportunity to request an in-person assessment or other available alternative prior to the telemedicine visit and am voluntarily participating in the telemedicine visit.  I understand that I have the right to withhold or withdraw my consent to the use of telemedicine in the course of my care at any time, without affecting my right to future care or treatment, and that the Practitioner or I may terminate the telemedicine visit at any time. I understand that I have the right to inspect all information obtained and/or recorded in the course of the telemedicine visit and may receive copies of available information for a reasonable fee.  I understand that some of the potential risks of receiving the Services via telemedicine include:  Delay or interruption in medical evaluation due to technological equipment failure or  disruption; Information transmitted may not be sufficient (e.g. poor resolution of images) to allow for appropriate medical decision making by the Practitioner; and/or  In rare instances, security protocols could fail, causing a breach of personal health information.  Furthermore, I acknowledge that it is my responsibility to provide information about my medical history, conditions and care that is complete and accurate to the best of my ability. I acknowledge that Practitioner's advice, recommendations, and/or decision may be based on factors not within their control, such as incomplete or inaccurate data provided by me or distortions of diagnostic images or specimens that may result from electronic transmissions. I understand that the practice of medicine is not an exact science and that Practitioner makes no warranties or guarantees regarding treatment outcomes. I acknowledge that a copy of this consent can be made available to me via my patient portal [facility] MyChart), or I can request a printed copy by calling the office of [facility].    I understand that my insurance will be billed for this visit.   I have read or had this consent read to me. I understand the contents of this consent, which adequately explains the benefits and risks of the Services being provided via telemedicine.  I have been provided ample opportunity to ask questions regarding this consent and the Services and have had my questions answered to my satisfaction. I give my informed consent for the services to be provided through the use of telemedicine in my medical care

## 2023-01-21 NOTE — Telephone Encounter (Signed)
   Name: Teresa Jensen  DOB: 1945/09/07  MRN: [id number]  Primary Cardiologist: Yvonne Kendall, MD   Preoperative team, please contact this patient and set up a phone call appointment for further preoperative risk assessment. Please obtain consent and complete medication review. Thank you for your help.  I confirm that guidance regarding antiplatelet and oral anticoagulation therapy has been completed and, if necessary, noted below (none requested).   I also confirmed the patient resides in the state of [place]. As per [facility] Medical Board telemedicine laws, the patient must reside in the state in which the provider is licensed.   Joylene Grapes, NP 01/21/2023, 9:04 AM [facility]

## 2023-01-21 NOTE — Telephone Encounter (Signed)
Pt has been scheduled tele preop appt 02/01/23. Med rec and consent are done. Surgeon office wants clearance before scheduling per pt.

## 2023-01-21 NOTE — Telephone Encounter (Signed)
Patient is returning call. Requesting return call.  

## 2023-01-21 NOTE — Telephone Encounter (Signed)
Received from Dr. Sarina Ser.  From pulmonary standpoint patient is at moderate to high risk for general anesthesia for the proposed colonoscopy.The risk can not be further reduced given the patients multiple co-morbidities which include COPD stage ll, postinflammatory pulmonary fibrosis and chronic respiratory failure with hypoxia with oxygen dependency.She is as compensated as she is to get. The risk however is not prohibitive. Continue respiratory medications as they are. Ensure patient does adequate deep breathing post procedure to avoid atelectasis.

## 2023-01-22 ENCOUNTER — Encounter: Payer: Self-pay | Admitting: Primary Care

## 2023-01-22 ENCOUNTER — Ambulatory Visit (INDEPENDENT_AMBULATORY_CARE_PROVIDER_SITE_OTHER): Payer: Medicare Other | Admitting: Primary Care

## 2023-01-22 VITALS — BP 126/72 | HR 76 | Temp 98.1°F | Ht <= 58 in | Wt 161.0 lb

## 2023-01-22 DIAGNOSIS — J449 Chronic obstructive pulmonary disease, unspecified: Secondary | ICD-10-CM | POA: Diagnosis not present

## 2023-01-22 NOTE — Assessment & Plan Note (Signed)
Controlled.  Exam and HPI not consistent of COPD exacerbation. No wheezing or unusual sounds noted on exam today.  CT chest has yet to be read by radiology, we will work on getting this expedited today. Await CT chest results.  Defer treatment for now.

## 2023-01-22 NOTE — Progress Notes (Signed)
Subjective:    Patient ID: Teresa Jensen, female    DOB: Aug 22, 1945, 78 y.o.   MRN: [id number]  Wheezing  Pertinent negatives include no chest pain, chills, fever, headaches or sore throat.    Teresa Jensen is a very pleasant 78 y.o. female with a history of hypertension, CHF, COPD, postinflammatory pulmonary fibrosis, lung cancer, exertional dyspnea who presents today to discuss wheezing.  Evaluated by heart failure clinic on 01/18/2023 for heart failure follow-up.  During this visit she mentioned moderate exertional dyspnea with mild exertion, cough, palpitations.  She was told during this visit that she had wheezing upon exam and to see her PCP for further evaluation.  A few weeks ago she began noticing increased post nasal drip. Clear, thick, and "sticky" drainage, mostly in the morning, improves throughout the day.    She denies audible wheezing, increased dyspnea, fevers, chills, increased fatigue. Overall she feels well.  She is managed on supplemental oxygen at 2.5 L continuously.  She underwent CT chest on 01/15/2023 per radiology oncology, waiting on results.     Review of Systems  Constitutional:  Negative for chills and fever.  HENT:  Positive for postnasal drip. Negative for sore throat.   Respiratory:  Positive for wheezing.        Denies increased dyspnea and cough.  Cardiovascular:  Negative for chest pain.  Neurological:  Negative for headaches.         Past Medical History:  Diagnosis Date   (HFpEF) heart failure with preserved ejection fraction (HCC) 06/28/2011   a.) TTE 06/28/2011: EF 55-60%, mild AR, G1DD; b.) TTE 01/16/2013: EF 55-60%, mild MR; c.) TTE 02/16/2018: EF 60-65%, G1DD; d.) MPI 11/26/2018: EF 56%   Abnormal electrocardiogram 11/12/2018   Acute kidney failure (HCC) 01/12/2017   Acute on chronic respiratory failure with hypoxia (HCC) 09/06/2022   Adenoma of left adrenal gland    Anemia    Anxiety    Aortic atherosclerosis (HCC)     Bilateral carpal tunnel syndrome    CAD (coronary artery disease)    a.) HR CT chest 10/30/2021: 2 vessel CAD (LAD/RCA)   CAP (community acquired pneumonia)    COPD (chronic obstructive pulmonary disease) (HCC)    COPD exacerbation (HCC) 01/21/2018   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   Diverticulosis    Dyspnea    DUE TO HEAT WITH COPD   Epistaxis 01/09/2017   GERD (gastroesophageal reflux disease)    Herpes zoster 07/08/2015   Hypertension    Hypothyroidism    Iatrogenic hypotension 01/12/2017   Iliac artery stenosis, bilateral (HCC)    Morbid obesity with BMI of 40.0-44.9, adult (HCC)    New onset of headaches    Osteopenia    PMR (polymyalgia rheumatica) (HCC)    Pneumonia 02/13/2018   PONV (postoperative nausea and vomiting)    Pulmonary nodule, left    a.) CT chest 10/30/2021: 1.5 cm medial LUL nodule; b.) PET CT 11/21/2021: hypermetabolic LUL nodule (SUX max 9.3)   S/P carpal tunnel release 08/04/2020   Shingles    Stasis dermatitis    Strain of chest wall 01/20/2011   Tobacco abuse    Vitamin D deficiency     Social History   Socioeconomic History   Marital status: Married    Spouse name: [name]   Number of children: 1   Years of education: Not on file   Highest education level: Not on file  Occupational History   Occupation: Catering manager as a  Paramedic: RETIRED    Comment: retired  Tobacco Use   Smoking status: Former    Current packs/day: 0.00    Average packs/day: 1.5 packs/day for 20.0 years (30.0 ttl pk-yrs)    Types: Cigarettes    Start date: 02/11/1989    Quit date: 02/11/2009    Years since quitting: 13.9   Smokeless tobacco: Never  Vaping Use   Vaping status: Never Used  Substance and Sexual Activity   Alcohol use: No    Alcohol/week: 0.0 standard drinks of alcohol   Drug use: No   Sexual activity: Not Currently  Other Topics Concern   Not on file  Social History Narrative   Lives in [place] with her husband.   Takes care of her two grandchildren, ages 39 and 2, everyday in [place].  Has one son who is 33.   Desires CPR   Would not want prolonged life support if futile   Social Drivers of Corporate investment banker Strain: Low Risk  (05/07/2022)   Overall Financial Resource Strain (CARDIA)    Difficulty of Paying Living Expenses: Not hard at all  Food Insecurity: No Food Insecurity (09/07/2022)   Hunger Vital Sign    Worried About Running Out of Food in the Last Year: Never true    Ran Out of Food in the Last Year: Never true  Transportation Needs: No Transportation Needs (09/07/2022)   PRAPARE - Administrator, Civil Service (Medical): No    Lack of Transportation (Non-Medical): No  Physical Activity: Insufficiently Active (05/07/2022)   Exercise Vital Sign    Days of Exercise per Week: 3 days    Minutes of Exercise per Session: 30 min  Stress: No Stress Concern Present (05/07/2022)   [employer] of Occupational Health - Occupational Stress Questionnaire    Feeling of Stress : Not at all  Social Connections: Moderately Integrated (05/07/2022)   Social Connection and Isolation Panel [NHANES]    Frequency of Communication with Friends and Family: More than three times a week    Frequency of Social Gatherings with Friends and Family: More than three times a week    Attends Religious Services: More than 4 times per year    Active Member of [employer] or Organizations: No    Attends Banker Meetings: Never    Marital Status: Married  Catering manager Violence: Not At Risk (09/07/2022)   Humiliation, Afraid, Rape, and Kick questionnaire    Fear of Current or Ex-Partner: No    Emotionally Abused: No    Physically Abused: No    Sexually Abused: No    Past Surgical History:  Procedure Laterality Date   ANTERIOR CERVICAL DECOMP/DISCECTOMY FUSION  06/22/2004   C4-C6   APPENDECTOMY     CARPAL TUNNEL RELEASE Left 08/31/2019   Procedure: CARPAL TUNNEL RELEASE;  Surgeon:  Donato Heinz, MD;  Location: [facility];  Service: Orthopedics;  Laterality: Left;   CARPAL TUNNEL RELEASE Right 06/22/2020   Procedure: CARPAL TUNNEL RELEASE;  Surgeon: Donato Heinz, MD;  Location: [facility];  Service: Orthopedics;  Laterality: Right;   CHOLECYSTECTOMY     COLONOSCOPY     ENDOBRONCHIAL ULTRASOUND Left 01/17/2022   Procedure: ENDOBRONCHIAL ULTRASOUND;  Surgeon: Salena Saner, MD;  Location: [facility];  Service: Pulmonary;  Laterality: Left;   ESOPHAGOGASTRODUODENOSCOPY     HEMORRHOID SURGERY     TOTAL ABDOMINAL HYSTERECTOMY W/ BILATERAL SALPINGOOPHORECTOMY     TUBAL LIGATION  Family History  Problem Relation Age of Onset   Heart attack Mother 1   Cancer Father        lung/chest wall    Prostate cancer Father    Diabetes Brother        siblings   Hypertension Sister        x 2   Heart disease Brother    Breast cancer Paternal Grandmother    Heart disease Sister    Diabetes Sister    Tuberculosis Sister    Cirrhosis Brother     Allergies  Allergen Reactions   Epinephrine Anaphylaxis and Shortness Of Breath   Food Rash    MANGO = cause rashes around the mouth LIPS GO NUMB   Mangifera Indica Rash    THIS IS MANGO LIPS GO NUMB   Other Rash    MANGO = cause rashes around the mouth MANGO = cause rashes around the mouth LIPS GO NUMB   Prednisone Anaphylaxis, Hives, Shortness Of Breath, Itching and Swelling    The face swells HAS TAKEN CORTISONE SHOTS WITHOUT ISSUE BEFORE   Ivp Dye [Iodinated Contrast Media] Hives   Lactose Intolerance (Gi) Diarrhea   Pneumococcal 13-Val Conj Vacc Itching and Other (See Comments)    Arm temp elevated at site of injection, red, itching   Prevnar 13 [Pneumococcal 13-Val Conj Vacc] Other (See Comments)    Arm temp elevated at site of injection, red, itching    Current Outpatient Medications on File Prior to Visit  Medication Sig Dispense Refill   alendronate (FOSAMAX) 70 MG tablet Take 70 mg by mouth once a  week. Take with a full glass of water on an empty stomach.     atorvastatin (LIPITOR) 10 MG tablet Take 1 tablet (10 mg total) by mouth daily. for cholesterol. 90 tablet 1   Calcium Carb-Cholecalciferol (CALCIUM + VITAMIN D3 PO) Take 1,200 mg by mouth daily.     famotidine (PEPCID) 20 MG tablet TAKE 1 TABLET BY MOUTH AFTER SUPPER 90 tablet 3   FARXIGA 10 MG TABS tablet Take 1 tablet (10 mg total) by mouth daily before breakfast. 30 tablet 6   furosemide (LASIX) 40 MG tablet Take 1 tablet (40 mg total) by mouth daily. 30 tablet 0   Glycopyrrolate-Formoterol (BEVESPI AEROSPHERE) 9-4.8 MCG/ACT AERO Inhale 2 puffs into the lungs 2 (two) times daily. 3 each 3   levalbuterol (XOPENEX HFA) 45 MCG/ACT inhaler Inhale 2 puffs into the lungs every 4 (four) hours as needed for wheezing or shortness of breath. 1 each 12   levothyroxine (SYNTHROID) 75 MCG tablet TAKE 1 EVERY AM ON AN EMPTY STOMACH WITH WATER ONLY. NO FOOD OR OTHER MEDICATIONS FOR 30 MINUTES. 90 tablet 2   losartan (COZAAR) 100 MG tablet TAKE 1 TABLET (100 MG TOTAL) BY MOUTH DAILY. PLEASE CALL [phone] TO SCHEDULE A YEARLY APPOINTMENT. THANK YOU. 90 tablet 3   Multiple Vitamins-Minerals (PRESERVISION AREDS 2 PO) Take 1 tablet by mouth in the morning and at bedtime.     pantoprazole (PROTONIX) 40 MG tablet Take 1 tablet (40 mg total) by mouth daily. for heartburn. 90 tablet 3   pentoxifylline (TRENTAL) 400 MG CR tablet TAKE 1 TABLET BY MOUTH 3 TIMES DAILY WITH MEALS. 270 tablet 1   spironolactone (ALDACTONE) 25 MG tablet Take 0.5 tablets (12.5 mg total) by mouth daily. 45 tablet 3   tacrolimus (PROTOPIC) 0.1 % ointment Apply to aa's lower legs BID PRN. 60 g 5   No current facility-administered medications  on file prior to visit.    BP 126/72   Pulse 76   Temp 98.1 F (36.7 C) (Temporal)   Ht 4\' 9"  (1.448 m)   Wt 161 lb (73 kg)   SpO2 98% Comment: 2 L of O2  BMI 34.84 kg/m  Objective:   Physical Exam Cardiovascular:     Rate and  Rhythm: Normal rate and regular rhythm.  Pulmonary:     Effort: Pulmonary effort is normal.     Breath sounds: Normal breath sounds. No wheezing or rhonchi.  Musculoskeletal:     Cervical back: Neck supple.  Skin:    General: Skin is warm and dry.  Neurological:     Mental Status: She is alert and oriented to person, place, and time.  Psychiatric:        Mood and Affect: Mood normal.           Assessment & Plan:  COPD GOLD 2  Assessment & Plan: Controlled.  Exam and HPI not consistent of COPD exacerbation. No wheezing or unusual sounds noted on exam today.  CT chest has yet to be read by radiology, we will work on getting this expedited today. Await CT chest results.  Defer treatment for now.         Doreene Nest, NP

## 2023-01-22 NOTE — Patient Instructions (Addendum)
We will be in touch later today regarding your CT scan results.  Please notify me if you develop increased shortness of breath, increased cough, increased sputum production.  It was a pleasure to see you today!

## 2023-01-24 ENCOUNTER — Ambulatory Visit: Payer: Medicare Other | Admitting: Radiation Oncology

## 2023-01-28 ENCOUNTER — Ambulatory Visit
Admission: RE | Admit: 2023-01-28 | Discharge: 2023-01-28 | Disposition: A | Discharge status: Home / Self Care | Payer: Medicare Other | Source: Ambulatory Visit | Attending: Radiation Oncology | Admitting: Radiation Oncology

## 2023-01-28 ENCOUNTER — Encounter: Payer: Self-pay | Admitting: Radiation Oncology

## 2023-01-28 ENCOUNTER — Other Ambulatory Visit: Payer: Self-pay | Admitting: *Deleted

## 2023-01-28 ENCOUNTER — Ambulatory Visit: Payer: Medicare Other | Admitting: Dermatology

## 2023-01-28 VITALS — BP 111/65 | HR 78 | Temp 97.4°F | Resp 16 | Wt 161.0 lb

## 2023-01-28 DIAGNOSIS — C3412 Malignant neoplasm of upper lobe, left bronchus or lung: Secondary | ICD-10-CM | POA: Insufficient documentation

## 2023-01-28 DIAGNOSIS — Z923 Personal history of irradiation: Secondary | ICD-10-CM | POA: Insufficient documentation

## 2023-01-28 NOTE — Progress Notes (Signed)
Radiation Oncology Follow up Note  Name: Teresa Jensen   Date:   01/28/2023 MRN:  [id number] DOB: 04/04/1945    This 78 y.o. 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  REFERRING PROVIDER: Doreene Nest, NP  HPI: Patient is a 78 year old female now out 10 months having completed SBRT to her left upper lobe for stage I squamous cell carcinoma.  She continues on nasal oxygen which she had preceding her treatments.  She specifically denies any worsening of her pulmonary status cough hemoptysis or chest tightness.  She had a recent CT scan of her chest.  Showing treated lesion anterior to the left upper lobe decreasing in size with no evidence of local recurrence or metastatic disease.  She also had some patchy airspace disease more inferiorly in the lingula likely infectious in nature.  She had stable mildly enlarged mediastinal lymph nodes left likely reactive.  COMPLICATIONS OF TREATMENT: none  FOLLOW UP COMPLIANCE: keeps appointments   PHYSICAL EXAM:  BP 111/65   Pulse 78   Temp (!) 97.4 F (36.3 C) (Temporal)   Resp 16   Wt 161 lb (73 kg)   BMI 34.84 kg/m  Well-developed well-nourished patient in NAD. HEENT reveals PERLA, EOMI, discs not visualized.  Oral cavity is clear. No oral mucosal lesions are identified. Neck is clear without evidence of cervical or supraclavicular adenopathy. Lungs are clear to A&P. Cardiac examination is essentially unremarkable with regular rate and rhythm without murmur rub or thrill. Abdomen is benign with no organomegaly or masses noted. Motor sensory and DTR levels are equal and symmetric in the upper and lower extremities. Cranial nerves II through XII are grossly intact. Proprioception is intact. No peripheral adenopathy or edema is identified. No motor or sensory levels are noted. Crude visual fields are within normal range.  RADIOLOGY RESULTS: CT scan  reviewed compatible with above-stated findings  PLAN: Present time patient is doing well with no evidence of progressive disease in her chest based on CT findings.  And pleased with her overall progress.  I have asked to see her back in 6 months with a repeat CT scan of her chest prior to that visit.  I will then to go to once a year visits should they be normal.  Patient comprehends her recommendations well.  I would like to take this opportunity to thank you for allowing me to participate in the care of your patient.Carmina Miller, MD

## 2023-02-01 ENCOUNTER — Ambulatory Visit: Payer: Medicare Other | Attending: Cardiology

## 2023-02-01 DIAGNOSIS — Z01810 Encounter for preprocedural cardiovascular examination: Secondary | ICD-10-CM

## 2023-02-01 NOTE — Progress Notes (Signed)
Virtual Visit via Telephone Note   Because of Teresa Jensen's co-morbid illnesses, she is at least at moderate risk for complications without adequate follow up.  This format is felt to be most appropriate for this patient at this time.  The patient did not have access to video technology/had technical difficulties with video requiring transitioning to audio format only (telephone).  All issues noted in this document were discussed and addressed.  No physical exam could be performed with this format.  Please refer to the patient's chart for her consent to telehealth for [facility].  Evaluation Performed:  Preoperative cardiovascular risk assessment _____________   Date:  02/01/2023   Patient ID:  Teresa Jensen, DOB Apr 04, 1945, MRN [id number] Patient Location:  Home Provider location:   Office  Primary Care Provider:  Doreene Nest, NP Primary Cardiologist:  Yvonne Kendall, MD  Chief Complaint / Patient Profile   78 y.o. y/o female with a h/o HFpEF, HTN, COPD who is pending colonoscopy and presents today for telephonic preoperative cardiovascular risk assessment.  History of Present Illness    Teresa Jensen is a 78 y.o. female who presents via audio/video conferencing for a telehealth visit today.  Pt was last seen in cardiology clinic on 10/31/2022 by Cadence Furth PA-C.  At that time Solash Tullo Walter was doing well .  The patient is now pending procedure as outlined above. Since her last visit, she remains stable from cardiac standpoint.  Today she denies chest pain, increased shortness of breath, lower extremity edema, fatigue, palpitations, melena, hematuria, hemoptysis, diaphoresis, weakness, presyncope, syncope, orthopnea, and PND.   Past Medical History    Past Medical History:  Diagnosis Date   (HFpEF) heart failure with preserved ejection fraction (HCC) 06/28/2011   a.) TTE 06/28/2011: EF 55-60%, mild AR, G1DD; b.) TTE 01/16/2013: EF 55-60%, mild  MR; c.) TTE 02/16/2018: EF 60-65%, G1DD; d.) MPI 11/26/2018: EF 56%   Abnormal electrocardiogram 11/12/2018   Acute kidney failure (HCC) 01/12/2017   Acute on chronic respiratory failure with hypoxia (HCC) 09/06/2022   Adenoma of left adrenal gland    Anemia    Anxiety    Aortic atherosclerosis (HCC)    Bilateral carpal tunnel syndrome    CAD (coronary artery disease)    a.) HR CT chest 10/30/2021: 2 vessel CAD (LAD/RCA)   CAP (community acquired pneumonia)    COPD (chronic obstructive pulmonary disease) (HCC)    COPD exacerbation (HCC) 01/21/2018   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   Diverticulosis    Dyspnea    DUE TO HEAT WITH COPD   Epistaxis 01/09/2017   GERD (gastroesophageal reflux disease)    Herpes zoster 07/08/2015   Hypertension    Hypothyroidism    Iatrogenic hypotension 01/12/2017   Iliac artery stenosis, bilateral (HCC)    Morbid obesity with BMI of 40.0-44.9, adult (HCC)    New onset of headaches    Osteopenia    PMR (polymyalgia rheumatica) (HCC)    Pneumonia 02/13/2018   PONV (postoperative nausea and vomiting)    Pulmonary nodule, left    a.) CT chest 10/30/2021: 1.5 cm medial LUL nodule; b.) PET CT 11/21/2021: hypermetabolic LUL nodule (SUX max 9.3)   S/P carpal tunnel release 08/04/2020   Shingles    Stasis dermatitis    Strain of chest wall 01/20/2011   Tobacco abuse    Vitamin D deficiency    Past Surgical History:  Procedure Laterality Date   ANTERIOR CERVICAL DECOMP/DISCECTOMY FUSION  06/22/2004   C4-C6   APPENDECTOMY     CARPAL TUNNEL RELEASE Left 08/31/2019   Procedure: CARPAL TUNNEL RELEASE;  Surgeon: Donato Heinz, MD;  Location: [facility];  Service: Orthopedics;  Laterality: Left;   CARPAL TUNNEL RELEASE Right 06/22/2020   Procedure: CARPAL TUNNEL RELEASE;  Surgeon: Donato Heinz, MD;  Location: [facility];  Service: Orthopedics;  Laterality: Right;   CHOLECYSTECTOMY     COLONOSCOPY     ENDOBRONCHIAL ULTRASOUND Left 01/17/2022    Procedure: ENDOBRONCHIAL ULTRASOUND;  Surgeon: Salena Saner, MD;  Location: [facility];  Service: Pulmonary;  Laterality: Left;   ESOPHAGOGASTRODUODENOSCOPY     HEMORRHOID SURGERY     TOTAL ABDOMINAL HYSTERECTOMY W/ BILATERAL SALPINGOOPHORECTOMY     TUBAL LIGATION      Allergies  Allergies  Allergen Reactions   Epinephrine Anaphylaxis and Shortness Of Breath   Food Rash    MANGO = cause rashes around the mouth LIPS GO NUMB   Mangifera [employer] Rash    THIS IS MANGO LIPS GO NUMB   Other Rash    MANGO = cause rashes around the mouth MANGO = cause rashes around the mouth LIPS GO NUMB   Prednisone Anaphylaxis, Hives, Shortness Of Breath, Itching and Swelling    The face swells HAS TAKEN CORTISONE SHOTS WITHOUT ISSUE BEFORE   Ivp Dye [Iodinated Contrast Media] Hives   Lactose Intolerance (Gi) Diarrhea   Pneumococcal 13-Val Conj Vacc Itching and Other (See Comments)    Arm temp elevated at site of injection, red, itching   Prevnar 13 [Pneumococcal 13-Val Conj Vacc] Other (See Comments)    Arm temp elevated at site of injection, red, itching    Home Medications    Prior to Admission medications   Medication Sig Start Date End Date Taking? Authorizing Provider  alendronate (FOSAMAX) 70 MG tablet Take 70 mg by mouth once a week. Take with a full glass of water on an empty stomach.    [provider]  atorvastatin (LIPITOR) 10 MG tablet Take 1 tablet (10 mg total) by mouth daily. for cholesterol. 09/05/22   Doreene Nest, NP  Calcium Carb-Cholecalciferol (CALCIUM + VITAMIN D3 PO) Take 1,200 mg by mouth daily.    [provider]  famotidine (PEPCID) 20 MG tablet TAKE 1 TABLET BY MOUTH AFTER SUPPER 08/27/22   Nyoka Cowden, MD  FARXIGA 10 MG TABS tablet Take 1 tablet (10 mg total) by mouth daily before breakfast. 09/17/22   Delma Freeze, FNP  furosemide (LASIX) 40 MG tablet Take 1 tablet (40 mg total) by mouth daily. 09/08/22 09/08/23  Marrion Coy, MD   Glycopyrrolate-Formoterol (BEVESPI AEROSPHERE) 9-4.8 MCG/ACT AERO Inhale 2 puffs into the lungs 2 (two) times daily. 10/08/22   Salena Saner, MD  levalbuterol [facility] HFA) 45 MCG/ACT inhaler Inhale 2 puffs into the lungs every 4 (four) hours as needed for wheezing or shortness of breath. 08/15/22   Salena Saner, MD  levothyroxine (SYNTHROID) 75 MCG tablet TAKE 1 EVERY AM ON AN EMPTY STOMACH WITH WATER ONLY. NO FOOD OR OTHER MEDICATIONS FOR 30 MINUTES. 05/20/22   Doreene Nest, NP  losartan (COZAAR) 100 MG tablet TAKE 1 TABLET (100 MG TOTAL) BY MOUTH DAILY. PLEASE CALL [phone] TO SCHEDULE A YEARLY APPOINTMENT. THANK YOU. 11/15/22   End, Cristal Deer, MD  Multiple Vitamins-Minerals (PRESERVISION AREDS 2 PO) Take 1 tablet by mouth in the morning and at bedtime.    [provider]  pantoprazole (PROTONIX) 40 MG  tablet Take 1 tablet (40 mg total) by mouth daily. for heartburn. 08/08/22   Doreene Nest, NP  pentoxifylline (TRENTAL) 400 MG CR tablet TAKE 1 TABLET BY MOUTH 3 TIMES DAILY WITH MEALS. 06/18/22   Willeen Niece, MD  spironolactone (ALDACTONE) 25 MG tablet Take 0.5 tablets (12.5 mg total) by mouth daily. 10/09/22   Clarisa Kindred A, FNP  tacrolimus (PROTOPIC) 0.1 % ointment Apply to aa's lower legs BID PRN. 12/10/22   Elie Goody, MD    Physical Exam    Vital Signs:  Jaicee Michelotti Tal does not have vital signs available for review today.  Given telephonic nature of communication, physical exam is limited. AAOx3. NAD. Normal affect.  Speech and respirations are unlabored.  Accessory Clinical Findings    None  Assessment & Plan    1.  Preoperative Cardiovascular Risk Assessment: Colonoscopy, date of Surgery:  Clearance TBD                               Surgeon:  NOT LISTED Surgeon's Group or Practice Name:  [facility] Phone number:  [phone] Fax number:  [phone]      Primary Cardiologist: Yvonne Kendall, MD  Chart reviewed as part of  pre-operative protocol coverage. Given past medical history and time since last visit, based on ACC/AHA guidelines, Jerolyn Flenniken Gossen would be at acceptable risk for the planned procedure without further cardiovascular testing.   Patient was advised that if she develops new symptoms prior to surgery to contact our office to arrange a follow-up appointment.  He verbalized understanding.  I will route this recommendation to the requesting party via Epic fax function and remove from pre-op pool.       Time:   Today, I have spent 8 minutes with the patient with telehealth technology discussing medical history, symptoms, and management plan.     Ronney Asters, NP  02/01/2023, 7:09 AM

## 2023-02-04 ENCOUNTER — Telehealth: Payer: Self-pay

## 2023-02-04 NOTE — Telephone Encounter (Signed)
Per NP Edd Fabian-- because of patients co-morbid illnesses she is at least moderate risk for complications without further adequate follow up.

## 2023-02-05 NOTE — Progress Notes (Signed)
 Pharmacy Medication Assistance Program Note    02/05/2023  Patient ID: Teresa Jensen, female   DOB: [birth year], 78 y.o.   MRN: [id number]     12/17/2022 02/05/2023  Outreach Medication One  Initial Outreach Date (Medication One) 11/21/2022   Manufacturer Medication One Charity Fundraiser Drugs Other Music Therapist Drug [facility]  Type of Forensic Scientist Assistance  Date Application Sent to Patient 11/23/2022   Application Items Requested Application   Date Application Sent to Prescriber 12/17/2022   Name of Prescriber Dedra Sanders   Date Application Received From Patient 12/10/2022   Application Items Received From Patient Application   Patient Assistance Determination  Approved  Approval End Date  01/01/2024  Additional Outreach Contact  Patient  Contacted Patient  Telephone Call

## 2023-02-07 ENCOUNTER — Other Ambulatory Visit: Payer: Self-pay | Admitting: Primary Care

## 2023-02-07 DIAGNOSIS — E038 Other specified hypothyroidism: Secondary | ICD-10-CM

## 2023-02-27 ENCOUNTER — Other Ambulatory Visit: Payer: Self-pay | Admitting: Primary Care

## 2023-02-27 DIAGNOSIS — I739 Peripheral vascular disease, unspecified: Secondary | ICD-10-CM

## 2023-03-07 ENCOUNTER — Other Ambulatory Visit: Payer: Self-pay | Admitting: Dermatology

## 2023-03-07 ENCOUNTER — Other Ambulatory Visit: Payer: Self-pay

## 2023-03-07 MED ORDER — PENTOXIFYLLINE ER 400 MG PO TBCR
400.0000 mg | EXTENDED_RELEASE_TABLET | Freq: Two times a day (BID) | ORAL | 1 refills | Status: DC
Start: 2023-03-07 — End: 2023-10-14

## 2023-03-08 ENCOUNTER — Encounter: Payer: Self-pay | Admitting: Pulmonary Disease

## 2023-03-08 ENCOUNTER — Ambulatory Visit (INDEPENDENT_AMBULATORY_CARE_PROVIDER_SITE_OTHER): Payer: Medicare Other | Admitting: Pulmonary Disease

## 2023-03-08 VITALS — BP 118/80 | HR 73 | Temp 97.1°F | Ht <= 58 in | Wt 161.0 lb

## 2023-03-08 DIAGNOSIS — J9611 Chronic respiratory failure with hypoxia: Secondary | ICD-10-CM

## 2023-03-08 DIAGNOSIS — C3412 Malignant neoplasm of upper lobe, left bronchus or lung: Secondary | ICD-10-CM

## 2023-03-08 DIAGNOSIS — J439 Emphysema, unspecified: Secondary | ICD-10-CM | POA: Diagnosis not present

## 2023-03-08 DIAGNOSIS — J449 Chronic obstructive pulmonary disease, unspecified: Secondary | ICD-10-CM | POA: Diagnosis not present

## 2023-03-08 DIAGNOSIS — J8410 Pulmonary fibrosis, unspecified: Secondary | ICD-10-CM

## 2023-03-08 NOTE — Progress Notes (Signed)
 Subjective:    Patient ID: Teresa Jensen, female    DOB: 1945/09/26, 78 y.o.   MRN: [id number]  Patient Care Team: Doreene Nest, NP as PCP - General (Internal Medicine) End, Cristal Deer, MD as PCP - Cardiology (Cardiology) Lonell Face, MD as Consulting Physician (Neurology) Galen Manila, MD as Referring Physician (Ophthalmology) Sandi Mealy, MD (Inactive) as Consulting Physician (Dermatology) Salena Saner, MD as Consulting Physician (Pulmonary Disease)  Chief Complaint  Patient presents with   Follow-up    No SOB or wheezing. Cough with clear sputum.     BACKGROUND/INTERVAL: Patient is a 78 year old remote former smoker (30 PY) with a history as noted below who presents for follow-up on the issue of stage II COPD, postinflammatory pulmonary fibrosis and squamous cell carcinoma of the lung. Patient follows from her visit of 06 December 2022. This is a scheduled visit.   HPI Discussed the use of AI scribe software for clinical note transcription with the patient, who gave verbal consent to proceed.  History of Present Illness   The patient, with COPD, pulmonary fibrosis, and non-small cell lung cancer, presents for follow-up.  She experiences variable days with her COPD, noting some days are good while others are not. She has not participated in pulmonary rehabilitation before. Her last pulmonary function test was in August of the previous year, and she is due for another in August this year. She uses Bevespi as part of her current medication regimen.  She has a history of postinflammatory pulmonary fibrosis, which contributes to her respiratory symptoms. She is scheduled for a CT scan ordered by Dr. Aggie Cosier in July to monitor for potential recurrence.  She has adjusted her oxygen use, reducing it to two liters while sitting, but monitors her levels closely. Previously, she was on three liters, especially when walking, as her oxygen levels drop significantly  during activity. She fills her oxygen tanks at home using a concentrator and has not been informed about smaller portable tanks.  She experiences arthritis pain, particularly in her hands and throughout her body. She has not been diagnosed with rheumatoid arthritis but has been told she has osteoarthritis. She does not currently see a rheumatologist.      DATA 06/2010 PFTs: FEV1 1.20 (56%), FVC 1.77 L or 63% predicted, FEV1/FVC 68%, significant airtrapping (RV 207%), DLCO 46% but nearly corrects by alveolar volume. 08/30/2021 PFTs: FEV1 0.95 L or 85% predicted, FVC 1.41 L or 71% predicted, FEV1/FVC 67%, normal lung volumes, no significant response.  Moderate diffusion capacity impairment which corrects to alveolar volume. 10/30/2021 CT chest high resolution: 1.5 cm spiculated solid left upper lobe pulmonary nodule suspicious for bronchogenic carcinoma.  No thoracic adenopathy.  Moderate patchy air trapping in both lungs indicative of small airways disease.  Scattered thin parenchymal bands mild patchy subpleural reticulation without bronchiectasis or honeycombing.  Mostly compatible with postinfectious/postinflammatory scarring.  Bilateral pleural thickening without pleural effusions. 11/21/2021 PET/CT: Hypermetabolic left upper lobe pulmonary nodule.  Mildly metabolic pericarinal lymph node, favored to be reactive.  No hypermetabolic left hilar lymph nodes.  No evidence of hypermetabolic distant metastatic disease. 01/12/2022 [facility] protocol CT: Slightly increased left upper lobe pulmonary nodule previously noted to be hypermetabolic.  Stable mild subcarinal lymphadenopathy, nonspecific.  Stable left adrenal adenoma. 01/17/2022 robotic bronchoscopy + EBUS: Left upper lobe nodule positive for squamous cell carcinoma.  TBNA negative for malignancy on subcarinal nodes with sufficient lymph node material consistent with sampling of lymph node. 05/07/2022: Ambulatory oximetry: Patient ambulated 250  feet and  desaturated to 82%.  Placed on 2 L/min continue to desaturate to 87%.  Required 4 L/min to maintain 90 to 91% O2 sats. 08/09/2022 PFTs: FEV1 0.79 L or 50% predicted, FVC 1.34 L or 62% predicted, FEV1/FVC 59%, lung volumes normal with mild to moderate air trapping evident.  There is severe diffusion defect.  Significant bronchodilator response.  Related to severe obstructive airways disease.  Study performed in 2023 there has been modest decline in function. 08/19/2022 overnight oximetry on oxygen at 3 L/min: Showing desaturation events 45 with ODI of 15 O2 nadir 87%, instructed to increase oxygen to 3-1/2 L/min during sleep. 09/07/2022 echocardiogram: LVEF 55 to 60%, wall motion abnormalities, determinate diastolic parameters, normal right ventricular function.  Mildly elevated pulmonary artery systolic pressure.  Left atrial size mildly dilated.  Mild mitral valve regurgitation.  Mild aortic valve stenosis. 01/15/2023 CT chest without contrast: Previously treated lesion anteriorly in the left upper lobe has decreased in size.  No evidence of local recurrence or metastatic disease.  New patchy airspace disease more inferiorly in the lingula likely infectious/inflammatory.  No suspicious pulmonary nodularity.  Stable mildly enlarged mediastinal lymph nodes, likely reactive.   Review of Systems A 10 point review of systems was performed and it is as noted above otherwise negative.   Patient Active Problem List   Diagnosis Date Noted   Urinary urgency 11/21/2022   Acute on chronic diastolic (congestive) heart failure (HCC) 09/06/2022   Allergic reaction to contrast dye 07/17/2022   Intermittent claudication (HCC) 07/04/2022   Lipodermatosclerosis of both lower extremities 02/22/2022   Solitary pulmonary nodule 11/27/2021   Chronic right shoulder pain 08/04/2021   Lower extremity pain 08/04/2021   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   Morbid obesity (HCC) 05/01/2021   Foot callus  09/01/2020   Rash and nonspecific skin eruption 07/07/2020   History of colonic polyps 11/01/2019   Bilateral carpal tunnel syndrome 08/16/2019   Chronic heart failure with preserved ejection fraction (HFpEF) (HCC) 11/12/2018   DOE (dyspnea on exertion) 11/12/2018   Osteoporosis 12/16/2017   Low vitamin B12 level 01/13/2017   Hypokalemia 01/12/2017   Essential hypertension 01/08/2017   Closed fracture of metatarsal bone 09/28/2016   Prediabetes 10/17/2015   Pain of right lower extremity 05/20/2015   Medicare annual wellness visit, subsequent 10/12/2014   Occipital neuralgia 10/12/2014   Vitamin D deficiency 10/12/2014   Erythema of lower extremity 09/08/2013   COPD GOLD 2  06/05/2010   Hypothyroidism 09/29/2009   Situational anxiety 10/01/2008   COPD 10/01/2008   GERD 10/01/2008   Osteoarthritis 10/01/2008    Social History   Tobacco Use   Smoking status: Former    Current packs/day: 0.00    Average packs/day: 1.5 packs/day for 20.0 years (30.0 ttl pk-yrs)    Types: Cigarettes    Start date: 02/11/1989    Quit date: 02/11/2009    Years since quitting: 14.0   Smokeless tobacco: Never  Substance Use Topics   Alcohol use: No    Alcohol/week: 0.0 standard drinks of alcohol    Allergies  Allergen Reactions   Epinephrine Anaphylaxis and Shortness Of Breath   Food Rash    MANGO = cause rashes around the mouth LIPS GO NUMB   [employer] Rash    THIS IS MANGO LIPS GO NUMB   Other Rash    MANGO = cause rashes around the mouth MANGO = cause rashes around the mouth LIPS GO NUMB   Prednisone Anaphylaxis, Hives,  Shortness Of Breath, Itching and Swelling    The face swells HAS TAKEN CORTISONE SHOTS WITHOUT ISSUE BEFORE   Ivp Dye [Iodinated Contrast Media] Hives   Lactose Intolerance (Gi) Diarrhea   Pneumococcal 13-Val Conj Vacc Itching and Other (See Comments)    Arm temp elevated at site of injection, red, itching   Prevnar 13 [Pneumococcal 13-Val Conj Vacc] Other  (See Comments)    Arm temp elevated at site of injection, red, itching    Current Meds  Medication Sig   alendronate (FOSAMAX) 70 MG tablet Take 70 mg by mouth once a week. Take with a full glass of water on an empty stomach.   atorvastatin (LIPITOR) 10 MG tablet TAKE 1 TABLET BY MOUTH DAILY. FOR CHOLESTEROL   Calcium Carb-Cholecalciferol (CALCIUM + VITAMIN D3 PO) Take 1,200 mg by mouth daily.   famotidine (PEPCID) 20 MG tablet TAKE 1 TABLET BY MOUTH AFTER SUPPER   FARXIGA 10 MG TABS tablet Take 1 tablet (10 mg total) by mouth daily before breakfast.   furosemide (LASIX) 40 MG tablet Take 1 tablet (40 mg total) by mouth daily.   Glycopyrrolate-Formoterol (BEVESPI AEROSPHERE) 9-4.8 MCG/ACT AERO Inhale 2 puffs into the lungs 2 (two) times daily.   levalbuterol (XOPENEX HFA) 45 MCG/ACT inhaler Inhale 2 puffs into the lungs every 4 (four) hours as needed for wheezing or shortness of breath.   levothyroxine (SYNTHROID) 75 MCG tablet TAKE 1 EVERY AM ON AN EMPTY STOMACH WITH WATER ONLY. NO FOOD OR OTHER MEDICATIONS FOR 30 MINUTES.   losartan (COZAAR) 100 MG tablet TAKE 1 TABLET (100 MG TOTAL) BY MOUTH DAILY. PLEASE CALL [phone] TO SCHEDULE A YEARLY APPOINTMENT. THANK YOU.   Multiple Vitamins-Minerals (PRESERVISION AREDS 2 PO) Take 1 tablet by mouth in the morning and at bedtime.   pantoprazole (PROTONIX) 40 MG tablet Take 1 tablet (40 mg total) by mouth daily. for heartburn.   pentoxifylline (TRENTAL) 400 MG CR tablet Take 1 tablet (400 mg total) by mouth in the morning and at bedtime.   spironolactone (ALDACTONE) 25 MG tablet Take 0.5 tablets (12.5 mg total) by mouth daily.   tacrolimus (PROTOPIC) 0.1 % ointment Apply to aa's lower legs BID PRN.    Immunization History  Administered Date(s) Administered   Fluad Quad(high Dose 65+) 09/02/2020, 10/12/2021   Fluad Trivalent(High Dose 65+) 10/19/2022   Influenza Whole 10/01/2008   Influenza,inj,Quad PF,6+ Mos 10/12/2014, 10/13/2015,  10/17/2016, 10/17/2017, 10/24/2018, 10/27/2019   PFIZER(Purple Top)SARS-COV-2 Vaccination 03/11/2019, 04/01/2019   Pneumococcal Conjugate-13 10/12/2014   Td 10/01/2008        Objective:     BP 118/80 (BP Location: Right Arm, Cuff Size: Normal)   Pulse 73   Temp (!) 97.1 F (36.2 C)   Ht 4\' 9"  (1.448 m)   Wt 161 lb (73 kg)   SpO2 97%   BMI 34.84 kg/m   SpO2: 97 % O2 Device: Nasal cannula O2 Flow Rate (L/min): 2 L/min O2 Type: Continuous O2  GENERAL: Obese woman, no acute distress.  Fully ambulatory, no conversational dyspnea. HEAD: Normocephalic, atraumatic.  EYES: Pupils equal, round, reactive to light.  No scleral icterus.  MOUTH: Dentition intact, oral mucosa moist. NECK: Supple. No thyromegaly. Trachea midline. No JVD.  No adenopathy. PULMONARY: Good air entry bilaterally.  Coarse, otherwise, no adventitious sounds. CARDIOVASCULAR: S1 and S2. Regular rate and rhythm.  Grade 1/6 systolic ejection murmur LSB, no gallops or rubs noted. ABDOMEN: Obese, otherwise benign. MUSCULOSKELETAL: No joint deformity, no clubbing, no edema.  NEUROLOGIC:  No overt focal deficit, no gait disturbance, speech is fluent. SKIN: Intact,warm,dry. PSYCH: Mood and behavior normal.   Ambulatory oxymetry was performed today:  At rest on room air oxygen saturation was 97%, the patient ambulated at a moderate pace, completed 1 laps, O2 nadir 88%, moderate shortness of breath.  Resting heart rate was 75 bpm at maximum for this exercise 132 bpm.  Patient was placed on oxygen at 2 L/min was able to maintain oxygen saturations between 92 to 95% and heart rate response to exercise was 103 bpm.    Assessment & Plan:     ICD-10-CM   1. Stage 2 moderate COPD by GOLD classification (HCC)  J44.9 AMB referral to pulmonary rehabilitation    2. Combined pulmonary fibrosis and emphysema (CPFE) (HCC)  J43.9    J84.10     3. Chronic respiratory failure with hypoxia (HCC)  J96.11     4. Squamous cell  carcinoma of bronchus in left upper lobe (HCC)  C34.12       Orders Placed This Encounter  Procedures   AMB referral to pulmonary rehabilitation    Referral Priority:   Routine    Referral Type:   Consultation    Number of Visits Requested:   1   Discussion:    Chronic Obstructive Pulmonary Disease (COPD) COPD with variable symptoms. Oxygen requirement is 2 L/min at rest and 3 L/min during ambulation due to significant desaturation. Pulmonary rehabilitation is recommended to improve exercise tolerance and manage symptoms, including anxiety related to breathing difficulties. - Enroll in pulmonary rehabilitation program (36 sessions, 3 times a week) - Assess oxygen needs during pulmonary rehabilitation - Continue Bevespi inhaler - Evaluate for ML6 portable oxygen tank for mobility - Ensure backup oxygen tanks for power outages  Pulmonary Fibrosis Pulmonary fibrosis is monitored with regular scans. Last scan reviewed with Dr. Aggie Cosier; next scan scheduled for July. - Review scan results with Dr. Rushie Chestnut in July  Non-Small Cell Lung Cancer Non-small cell lung cancer under surveillance with regular imaging. Last scan reviewed with Dr. Rushie Chestnut; next scan scheduled for July. - Review scan results with Dr. Rushie Chestnut in July  Osteoarthritis Osteoarthritis primarily affecting the hands, causing pain and discomfort. Staying active is recommended to manage symptoms. - Encourage activity to manage symptoms      Advised if symptoms do not improve or worsen, to please contact office for sooner follow up or seek emergency care.    I spent 40 minutes of dedicated to the care of this patient on the date of this encounter to include pre-visit review of records, face-to-face time with the patient discussing conditions above, post visit ordering of testing, clinical documentation with the electronic health record, making appropriate referrals as documented, and communicating necessary findings to  members of the patients care team.     C. Danice Goltz, MD Advanced Bronchoscopy PCCM [facility]    *This note was generated using voice recognition software/Dragon and/or AI transcription program.  Despite best efforts to proofread, errors can occur which can change the meaning. Any transcriptional errors that result from this process are unintentional and may not be fully corrected at the time of dictation.

## 2023-03-08 NOTE — Patient Instructions (Addendum)
 VISIT SUMMARY:  Today, we discussed your ongoing management for COPD, pulmonary fibrosis, non-small cell lung cancer, and osteoarthritis. We reviewed your current symptoms, medications, and upcoming scans. We also talked about new recommendations to help manage your conditions better.  YOUR PLAN:  -CHRONIC OBSTRUCTIVE PULMONARY DISEASE (COPD): COPD is a chronic lung disease that makes it hard to breathe. We recommend enrolling in a pulmonary rehabilitation program to help improve your exercise tolerance and manage your symptoms, including anxiety related to breathing difficulties. You should continue using your Vivespi inhaler. We will assess your oxygen needs during pulmonary rehabilitation and evaluate the use of a ML6 portable oxygen tank for better mobility. Ensure you have backup oxygen tanks for power outages.  -PULMONARY FIBROSIS: Pulmonary fibrosis is a condition where lung tissue becomes scarred, leading to breathing difficulties. We will continue to monitor this with regular scans. Your next scan is scheduled for July ordered by Dr. Rushie Chestnut.  -NON-SMALL CELL LUNG CANCER: Non-small cell lung cancer is a type of lung cancer. We are keeping this under surveillance with regular imaging. Your next scan is scheduled for July ordered by Dr. Rushie Chestnut.  -OSTEOARTHRITIS: Osteoarthritis is a joint condition that causes pain and stiffness, particularly in your hands. Staying active is recommended to help manage your symptoms.  INSTRUCTIONS:  Please enroll in the pulmonary rehabilitation program (36 sessions, 3 times a week). Continue using your Bevespi inhaler as prescribed.  Your shin requirements now have decreased to 2 L/min.  Will review your scan results with Dr. Rushie Chestnut in July for both pulmonary fibrosis and non-small cell lung cancer.

## 2023-03-15 ENCOUNTER — Telehealth: Payer: Self-pay | Admitting: *Deleted

## 2023-03-15 NOTE — Telephone Encounter (Signed)
 Please get the info and fax it to [phone] and the reference number : [id number]

## 2023-03-21 ENCOUNTER — Encounter: Payer: Self-pay | Admitting: Family Medicine

## 2023-03-21 ENCOUNTER — Ambulatory Visit (INDEPENDENT_AMBULATORY_CARE_PROVIDER_SITE_OTHER): Admitting: Family Medicine

## 2023-03-21 VITALS — BP 138/66 | HR 79 | Temp 98.1°F | Ht <= 58 in | Wt 162.2 lb

## 2023-03-21 DIAGNOSIS — B372 Candidiasis of skin and nail: Secondary | ICD-10-CM | POA: Diagnosis not present

## 2023-03-21 MED ORDER — NYSTATIN 100000 UNIT/GM EX CREA
1.0000 | TOPICAL_CREAM | Freq: Two times a day (BID) | CUTANEOUS | 0 refills | Status: DC
Start: 2023-03-21 — End: 2023-03-21

## 2023-03-21 MED ORDER — NYSTATIN 100000 UNIT/GM EX CREA: 1.0000 | TOPICAL_CREAM | Freq: Two times a day (BID) | CUTANEOUS | 1 refills | Status: DC

## 2023-03-21 NOTE — Progress Notes (Signed)
 Patient ID: Teresa Jensen, female    DOB: 1945/06/05, 78 y.o.   MRN: [id number]  This visit was conducted in person.  BP 138/66   Pulse 79   Temp 98.1 F (36.7 C) (Oral)   Ht 4\' 9"  (1.448 m)   Wt 162 lb 4 oz (73.6 kg)   SpO2 99% Comment: 2 L O2  BMI 35.11 kg/m    CC:  Chief Complaint  Patient presents with   Rash    C/o rash in crack of buttocks. Noticed several wks ago. Area is painful when sitting.     Subjective:   HPI: Teresa Jensen is a 78 y.o. female  patient of [facility] with histoyr of HFpEF, prediabetes, lipodermatosclerosis , hypothyroid, PAD, pulmonary fibrosis presenting on 03/21/2023 for Rash (C/o rash in crack of buttocks. Noticed several wks ago. Area is painful when sitting. )   She reports new onset rash in gluteal crease x 2 weeks.  Area is sore, no itch.  No blisters.  Not worsening.  She has applied cornstarch, rosebud salve, vaseline.   No fever, no flu like symptoms.  As we discussed the issue she also reports some similar erythema and skin change under her breasts and under pannus on right     Relevant past medical, surgical, family and social history reviewed and updated as indicated. Interim medical history since our last visit reviewed. Allergies and medications reviewed and updated. Outpatient Medications Prior to Visit  Medication Sig Dispense Refill   alendronate (FOSAMAX) 70 MG tablet Take 70 mg by mouth once a week. Take with a full glass of water on an empty stomach.     atorvastatin (LIPITOR) 10 MG tablet TAKE 1 TABLET BY MOUTH DAILY. FOR CHOLESTEROL 90 tablet 1   Calcium Carb-Cholecalciferol (CALCIUM + VITAMIN D3 PO) Take 1,200 mg by mouth daily.     famotidine (PEPCID) 20 MG tablet TAKE 1 TABLET BY MOUTH AFTER SUPPER 90 tablet 3   FARXIGA 10 MG TABS tablet Take 1 tablet (10 mg total) by mouth daily before breakfast. 30 tablet 6   furosemide (LASIX) 40 MG tablet Take 1 tablet (40 mg total) by mouth daily. 30 tablet 0    Glycopyrrolate-Formoterol (BEVESPI AEROSPHERE) 9-4.8 MCG/ACT AERO Inhale 2 puffs into the lungs 2 (two) times daily. 3 each 3   levalbuterol (XOPENEX HFA) 45 MCG/ACT inhaler Inhale 2 puffs into the lungs every 4 (four) hours as needed for wheezing or shortness of breath. 1 each 12   levothyroxine (SYNTHROID) 75 MCG tablet TAKE 1 EVERY AM ON AN EMPTY STOMACH WITH WATER ONLY. NO FOOD OR OTHER MEDICATIONS FOR 30 MINUTES. 90 tablet 1   losartan (COZAAR) 100 MG tablet TAKE 1 TABLET (100 MG TOTAL) BY MOUTH DAILY. PLEASE CALL [phone] TO SCHEDULE A YEARLY APPOINTMENT. THANK YOU. 90 tablet 3   Multiple Vitamins-Minerals (PRESERVISION AREDS 2 PO) Take 1 tablet by mouth in the morning and at bedtime.     pantoprazole (PROTONIX) 40 MG tablet Take 1 tablet (40 mg total) by mouth daily. for heartburn. 90 tablet 3   pentoxifylline (TRENTAL) 400 MG CR tablet Take 1 tablet (400 mg total) by mouth in the morning and at bedtime. 180 tablet 1   spironolactone (ALDACTONE) 25 MG tablet Take 0.5 tablets (12.5 mg total) by mouth daily. 45 tablet 3   tacrolimus (PROTOPIC) 0.1 % ointment Apply to aa's lower legs BID PRN. 60 g 5   No facility-administered medications prior to visit.  Per HPI unless specifically indicated in ROS section below Review of Systems  Constitutional:  Negative for fatigue and fever.  HENT:  Negative for congestion.   Eyes:  Negative for pain.  Respiratory:  Negative for cough and shortness of breath.   Cardiovascular:  Negative for chest pain, palpitations and leg swelling.  Gastrointestinal:  Negative for abdominal pain.  Genitourinary:  Negative for dysuria and vaginal bleeding.  Musculoskeletal:  Negative for back pain.  Skin:  Positive for rash.  Neurological:  Negative for syncope, light-headedness and headaches.  Psychiatric/Behavioral:  Negative for dysphoric mood.    Objective:  BP 138/66   Pulse 79   Temp 98.1 F (36.7 C) (Oral)   Ht 4\' 9"  (1.448 m)   Wt 162 lb 4 oz  (73.6 kg)   SpO2 99% Comment: 2 L O2  BMI 35.11 kg/m   Wt Readings from Last 3 Encounters:  03/21/23 162 lb 4 oz (73.6 kg)  03/08/23 161 lb (73 kg)  01/28/23 161 lb (73 kg)      Physical Exam Constitutional:      General: She is not in acute distress.    Appearance: Normal appearance. She is well-developed. She is obese. She is not ill-appearing or toxic-appearing.  HENT:     Head: Normocephalic.     Right Ear: Hearing, tympanic membrane, ear canal and external ear normal. Tympanic membrane is not erythematous, retracted or bulging.     Left Ear: Hearing, tympanic membrane, ear canal and external ear normal. Tympanic membrane is not erythematous, retracted or bulging.     Nose: No mucosal edema or rhinorrhea.     Right Sinus: No maxillary sinus tenderness or frontal sinus tenderness.     Left Sinus: No maxillary sinus tenderness or frontal sinus tenderness.     Mouth/Throat:     Pharynx: Uvula midline.  Eyes:     General: Lids are normal. Lids are everted, no foreign bodies appreciated.     Conjunctiva/sclera: Conjunctivae normal.     Pupils: Pupils are equal, round, and reactive to light.  Neck:     Thyroid: No thyroid mass or thyromegaly.     Vascular: No carotid bruit.     Trachea: Trachea normal.  Cardiovascular:     Rate and Rhythm: Normal rate and regular rhythm.     Pulses: Normal pulses.     Heart sounds: Normal heart sounds, S1 normal and S2 normal. No murmur heard.    No friction rub. No gallop.  Pulmonary:     Effort: Pulmonary effort is normal. No tachypnea or respiratory distress.     Breath sounds: Normal breath sounds. No decreased breath sounds, wheezing, rhonchi or rales.  Abdominal:     General: Bowel sounds are normal.     Palpations: Abdomen is soft.     Tenderness: There is no abdominal tenderness.  Musculoskeletal:     Cervical back: Normal range of motion and neck supple.  Skin:    General: Skin is warm and dry.     Findings: No rash.      Comments: Erythema under right pannus and erythema mild at gluteal crease, no ulcer, no broken skin No vesicles no pustules  Neurological:     Mental Status: She is alert.  Psychiatric:        Mood and Affect: Mood is not anxious or depressed.        Speech: Speech normal.        Behavior: Behavior normal. Behavior is cooperative.  Thought Content: Thought content normal.        Judgment: Judgment normal.       Results for orders placed or performed in visit on 11/21/22  POCT Urinalysis Dipstick (Automated)   Collection Time: 11/21/22 11:58 AM  Result Value Ref Range   Color, UA yellow    Clarity, UA clear    Glucose, UA Positive (A) Negative   Bilirubin, UA neg    Ketones, UA neg    Spec Grav, UA <=1.005 (A) 1.010 - 1.025   Blood, UA neg    pH, UA 6.0 5.0 - 8.0   Protein, UA Negative Negative   Urobilinogen, UA 0.2 0.2 or 1.0 E.U./dL   Nitrite, UA neg    Leukocytes, UA Trace (A) Negative  Urine Culture   Collection Time: 11/21/22  4:12 PM   Specimen: Urine  Result Value Ref Range   MICRO NUMBER: [id number]    SPECIMEN QUALITY: Adequate    Sample Source URINE    STATUS: FINAL    ISOLATE 1: Klebsiella pneumoniae (A)       Susceptibility   Klebsiella pneumoniae - URINE CULTURE, REFLEX    AMOX/CLAVULANIC <=2 Sensitive     AMPICILLIN 16 Resistant     AMPICILLIN/SULBACTAM <=2 Sensitive     CEFAZOLIN* <=4 Not Reportable      * For infections other than uncomplicated UTI caused by E. coli, K. pneumoniae or P. mirabilis: Cefazolin is resistant if MIC > or = 8 mcg/mL. (Distinguishing susceptible versus intermediate for isolates with MIC < or = 4 mcg/mL requires additional testing.) For uncomplicated UTI caused by E. coli, K. pneumoniae or P. mirabilis: Cefazolin is susceptible if MIC <32 mcg/mL and predicts susceptible to the oral agents cefaclor, cefdinir, cefpodoxime, cefprozil, cefuroxime, cephalexin and loracarbef.     CEFTAZIDIME <=1 Sensitive     CEFEPIME  <=1 Sensitive     CEFTRIAXONE <=1 Sensitive     CIPROFLOXACIN <=0.25 Sensitive     LEVOFLOXACIN <=0.12 Sensitive     GENTAMICIN <=1 Sensitive     IMIPENEM <=0.25 Sensitive     NITROFURANTOIN 64 Intermediate     PIP/TAZO <=4 Sensitive     TOBRAMYCIN <=1 Sensitive     TRIMETH/SULFA* <=20 Sensitive      * For infections other than uncomplicated UTI caused by E. coli, K. pneumoniae or P. mirabilis: Cefazolin is resistant if MIC > or = 8 mcg/mL. (Distinguishing susceptible versus intermediate for isolates with MIC < or = 4 mcg/mL requires additional testing.) For uncomplicated UTI caused by E. coli, K. pneumoniae or P. mirabilis: Cefazolin is susceptible if MIC <32 mcg/mL and predicts susceptible to the oral agents cefaclor, cefdinir, cefpodoxime, cefprozil, cefuroxime, cephalexin and loracarbef. Legend: S = Susceptible  I = Intermediate R = Resistant  NS = Not susceptible SDD = Susceptible Dose Dependent * = Not Tested  NR = Not Reported **NN = See Therapy Comments     Assessment and Plan  Candidal intertrigo Assessment & Plan: Acute, treat Candida with topical nystatin cream twice daily for the next several weeks.  Encouraged patient to continue for 48 to 72 hours after rash resolved.  Patient notified to return if rash spreading or not improving. Return and ER precautions provided.   Other orders -     Nystatin; Apply 1 Application topically 2 (two) times daily.  Dispense: 30 g; Refill: 1    No follow-ups on file.   Kerby Nora, MD

## 2023-03-21 NOTE — Assessment & Plan Note (Signed)
 Acute, treat Candida with topical nystatin cream twice daily for the next several weeks.  Encouraged patient to continue for 48 to 72 hours after rash resolved.  Patient notified to return if rash spreading or not improving. Return and ER precautions provided.

## 2023-04-18 ENCOUNTER — Encounter: Attending: Pulmonary Disease | Admitting: *Deleted

## 2023-04-18 ENCOUNTER — Encounter: Payer: Self-pay | Admitting: *Deleted

## 2023-04-18 DIAGNOSIS — J8410 Pulmonary fibrosis, unspecified: Secondary | ICD-10-CM | POA: Insufficient documentation

## 2023-04-18 DIAGNOSIS — J449 Chronic obstructive pulmonary disease, unspecified: Secondary | ICD-10-CM | POA: Insufficient documentation

## 2023-04-18 DIAGNOSIS — J4489 Other specified chronic obstructive pulmonary disease: Secondary | ICD-10-CM

## 2023-04-18 DIAGNOSIS — Z87891 Personal history of nicotine dependence: Secondary | ICD-10-CM | POA: Insufficient documentation

## 2023-04-18 DIAGNOSIS — Z5189 Encounter for other specified aftercare: Secondary | ICD-10-CM | POA: Insufficient documentation

## 2023-04-18 NOTE — Progress Notes (Signed)
 Virtual orientation call completed today. shehas an appointment on Date: 04/24/2023  for EP eval and gym Orientation.  Documentation of diagnosis can be found in [facility] 03/08/2023 .

## 2023-04-24 ENCOUNTER — Encounter

## 2023-04-24 VITALS — Ht 59.0 in | Wt 158.7 lb

## 2023-04-24 DIAGNOSIS — J449 Chronic obstructive pulmonary disease, unspecified: Secondary | ICD-10-CM | POA: Diagnosis not present

## 2023-04-24 DIAGNOSIS — J4489 Other specified chronic obstructive pulmonary disease: Secondary | ICD-10-CM

## 2023-04-24 DIAGNOSIS — Z87891 Personal history of nicotine dependence: Secondary | ICD-10-CM | POA: Diagnosis not present

## 2023-04-24 DIAGNOSIS — J8410 Pulmonary fibrosis, unspecified: Secondary | ICD-10-CM

## 2023-04-24 DIAGNOSIS — Z5189 Encounter for other specified aftercare: Secondary | ICD-10-CM | POA: Diagnosis present

## 2023-04-24 NOTE — Progress Notes (Signed)
 Pulmonary Individual Treatment Plan  Patient Details  Name: Teresa Jensen MRN: [id number] Date of Birth: October 24, 1945 Referring Provider:   Gattis Kass Pulmonary Rehab from 04/24/2023 in [facility] Cardiac and Pulmonary Rehab  Referring Provider Dr. Coralie Derrick       Initial Encounter Date:  Flowsheet Row Pulmonary Rehab from 04/24/2023 in [facility] Cardiac and Pulmonary Rehab  Date 04/24/23       Visit Diagnosis: COPD  Pulmonary fibrosis (HCC)  Patient's Home Medications on Admission:  Current Outpatient Medications:    alendronate  (FOSAMAX ) 70 MG tablet, Take 70 mg by mouth once a week. Take with a full glass of water on an empty stomach. (Patient not taking: Reported on 04/18/2023), Disp: , Rfl:    atorvastatin  (LIPITOR) 10 MG tablet, TAKE 1 TABLET BY MOUTH DAILY. FOR CHOLESTEROL, Disp: 90 tablet, Rfl: 1   Calcium  Carb-Cholecalciferol (CALCIUM  + VITAMIN D3 PO), Take 1,200 mg by mouth daily., Disp: , Rfl:    famotidine  (PEPCID ) 20 MG tablet, TAKE 1 TABLET BY MOUTH AFTER SUPPER, Disp: 90 tablet, Rfl: 3   FARXIGA  10 MG TABS tablet, Take 1 tablet (10 mg total) by mouth daily before breakfast., Disp: 30 tablet, Rfl: 6   furosemide  (LASIX ) 40 MG tablet, Take 1 tablet (40 mg total) by mouth daily., Disp: 30 tablet, Rfl: 0   Glycopyrrolate -Formoterol  (BEVESPI  AEROSPHERE) 9-4.8 MCG/ACT AERO, Inhale 2 puffs into the lungs 2 (two) times daily., Disp: 3 each, Rfl: 3   levalbuterol  (XOPENEX  HFA) 45 MCG/ACT inhaler, Inhale 2 puffs into the lungs every 4 (four) hours as needed for wheezing or shortness of breath., Disp: 1 each, Rfl: 12   levothyroxine  (SYNTHROID ) 75 MCG tablet, TAKE 1 EVERY AM ON AN EMPTY STOMACH WITH WATER ONLY. NO FOOD OR OTHER MEDICATIONS FOR 30 MINUTES., Disp: 90 tablet, Rfl: 1   losartan  (COZAAR ) 100 MG tablet, TAKE 1 TABLET (100 MG TOTAL) BY MOUTH DAILY. PLEASE CALL [phone] TO SCHEDULE A YEARLY APPOINTMENT. THANK YOU., Disp: 90 tablet, Rfl: 3   Multiple Vitamins-Minerals  (PRESERVISION AREDS 2 PO), Take 1 tablet by mouth in the morning and at bedtime., Disp: , Rfl:    nystatin  cream (MYCOSTATIN ), Apply 1 Application topically 2 (two) times daily., Disp: 30 g, Rfl: 1   pantoprazole  (PROTONIX ) 40 MG tablet, Take 1 tablet (40 mg total) by mouth daily. for heartburn., Disp: 90 tablet, Rfl: 3   pentoxifylline  (TRENTAL ) 400 MG CR tablet, Take 1 tablet (400 mg total) by mouth in the morning and at bedtime., Disp: 180 tablet, Rfl: 1   spironolactone  (ALDACTONE ) 25 MG tablet, Take 0.5 tablets (12.5 mg total) by mouth daily., Disp: 45 tablet, Rfl: 3   tacrolimus  (PROTOPIC ) 0.1 % ointment, Apply to aa's lower legs BID PRN., Disp: 60 g, Rfl: 5  Past Medical History: Past Medical History:  Diagnosis Date   (HFpEF) heart failure with preserved ejection fraction (HCC) 06/28/2011   a.) TTE 06/28/2011: EF 55-60%, mild AR, G1DD; b.) TTE 01/16/2013: EF 55-60%, mild MR; c.) TTE 02/16/2018: EF 60-65%, G1DD; d.) MPI 11/26/2018: EF 56%   Abnormal electrocardiogram 11/12/2018   Acute kidney failure (HCC) 01/12/2017   Acute on chronic respiratory failure with hypoxia (HCC) 09/06/2022   Adenoma of left adrenal gland    Anemia    Anxiety    Aortic atherosclerosis (HCC)    Bilateral carpal tunnel syndrome    CAD (coronary artery disease)    a.) HR CT chest 10/30/2021: 2 vessel CAD (LAD/RCA)   CAP (community acquired pneumonia)  COPD (chronic obstructive pulmonary disease) (HCC)    COPD exacerbation (HCC) 01/21/2018   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   Diverticulosis    Dyspnea    DUE TO HEAT WITH COPD   Epistaxis 01/09/2017   GERD (gastroesophageal reflux disease)    Herpes zoster 07/08/2015   Hypertension    Hypothyroidism    Iatrogenic hypotension 01/12/2017   Iliac artery stenosis, bilateral (HCC)    Morbid obesity with BMI of 40.0-44.9, adult (HCC)    New onset of headaches    Osteopenia    PMR (polymyalgia rheumatica) (HCC)    Pneumonia 02/13/2018   PONV  (postoperative nausea and vomiting)    Pulmonary nodule, left    a.) CT chest 10/30/2021: 1.5 cm medial LUL nodule; b.) PET CT 11/21/2021: hypermetabolic LUL nodule (SUX max 9.3)   S/P carpal tunnel release 08/04/2020   Shingles    Stasis dermatitis    Strain of chest wall 01/20/2011   Tobacco abuse    Vitamin D  deficiency     Tobacco Use: Social History   Tobacco Use  Smoking Status Former   Current packs/day: 0.00   Average packs/day: 1.5 packs/day for 20.0 years (30.0 ttl pk-yrs)   Types: Cigarettes   Start date: 02/11/1989   Quit date: 02/11/2009   Years since quitting: 14.2  Smokeless Tobacco Never    Labs: Review Flowsheet  More data exists      Latest Ref Rng & Units 10/17/2017 10/24/2018 10/27/2019 02/28/2021 02/22/2022  Labs for ITP Cardiac and Pulmonary Rehab  Cholestrol 0 - 200 mg/dL 841  324  401  027  253   LDL (calc) 0 - 99 mg/dL 84  95  82  66  59   HDL-C >39.00 mg/dL 66.44  03.47  42.59  56.38  51.80   Trlycerides 0.0 - 149.0 mg/dL 756.4  332.9  51.8  841.6  149.0   Hemoglobin A1c 4.6 - 6.5 % 6.4  6.1  6.4  5.3  5.5      Pulmonary Assessment Scores:  Pulmonary Assessment Scores     Row Name 04/24/23 1129         ADL UCSD   ADL Phase Entry     SOB Score total 55     Rest 3     Walk 3     Stairs 4     Bath 2     Dress 2     Shop 2       CAT Score   CAT Score 21       mMRC Score   mMRC Score 2              UCSD: Self-administered rating of dyspnea associated with activities of daily living (ADLs) 6-point scale (0 = "not at all" to 5 = "maximal or unable to do because of breathlessness")  Scoring Scores range from 0 to 120.  Minimally important difference is 5 units  CAT: CAT can identify the health impairment of COPD patients and is better correlated with disease progression.  CAT has a scoring range of zero to 40. The CAT score is classified into four groups of low (less than 10), medium (10 - 20), high (21-30) and very high (31-40)  based on the impact level of disease on health status. A CAT score over 10 suggests significant symptoms.  A worsening CAT score could be explained by an exacerbation, poor medication adherence, poor inhaler technique, or progression of COPD or comorbid conditions.  CAT MCID is 2 points  mMRC: mMRC (Modified Medical Research Council) Dyspnea Scale is used to assess the degree of baseline functional disability in patients of respiratory disease due to dyspnea. No minimal important difference is established. A decrease in score of 1 point or greater is considered a positive change.   Pulmonary Function Assessment:   Exercise Target Goals: Exercise Program Goal: Individual exercise prescription set using results from initial 6 min walk test and THRR while considering  patient's activity barriers and safety.   Exercise Prescription Goal: Initial exercise prescription builds to 30-45 minutes a day of aerobic activity, 2-3 days per week.  Home exercise guidelines will be given to patient during program as part of exercise prescription that the participant will acknowledge.  Education: Aerobic Exercise: - Group verbal and visual presentation on the components of exercise prescription. Introduces F.I.T.T principle from ACSM for exercise prescriptions.  Reviews F.I.T.T. principles of aerobic exercise including progression. Written material given at graduation. Flowsheet Row Pulmonary Rehab from 04/24/2023 in [facility] Cardiac and Pulmonary Rehab  Education need identified 04/24/23       Education: Resistance Exercise: - Group verbal and visual presentation on the components of exercise prescription. Introduces F.I.T.T principle from ACSM for exercise prescriptions  Reviews F.I.T.T. principles of resistance exercise including progression. Written material given at graduation.    Education: Exercise & Equipment Safety: - Individual verbal instruction and demonstration of equipment use and safety with  use of the equipment. Flowsheet Row Pulmonary Rehab from 04/24/2023 in [facility] Cardiac and Pulmonary Rehab  Date 04/24/23  Educator NT  Instruction Review Code 1- Verbalizes Understanding       Education: Exercise Physiology & General Exercise Guidelines: - Group verbal and written instruction with models to review the exercise physiology of the cardiovascular system and associated critical values. Provides general exercise guidelines with specific guidelines to those with heart or lung disease.    Education: Flexibility, Balance, Mind/Body Relaxation: - Group verbal and visual presentation with interactive activity on the components of exercise prescription. Introduces F.I.T.T principle from ACSM for exercise prescriptions. Reviews F.I.T.T. principles of flexibility and balance exercise training including progression. Also discusses the mind body connection.  Reviews various relaxation techniques to help reduce and manage stress (i.e. Deep breathing, progressive muscle relaxation, and visualization). Balance handout provided to take home. Written material given at graduation.   Activity Barriers & Risk Stratification:  Activity Barriers & Cardiac Risk Stratification - 04/24/23 1153       Activity Barriers & Cardiac Risk Stratification   Activity Barriers Shortness of Breath;Arthritis             6 Minute Walk:  6 Minute Walk     Row Name 04/24/23 1137         6 Minute Walk   Phase Initial     Distance 615 feet     Walk Time 4.73 minutes     # of Rest Breaks 3     MPH 1.48     METS 1.18     RPE 13     Perceived Dyspnea  2     VO2 Peak 4.13     Symptoms No     Resting HR 77 bpm     Resting BP 122/64     Resting Oxygen Saturation  95 %     Exercise Oxygen Saturation  during 6 min walk 90 %     Max Ex. HR 119 bpm     Max Ex. BP 142/66  2 Minute Post BP 120/62       Interval HR   1 Minute HR 105     2 Minute HR 111     3 Minute HR 119     4 Minute HR 112     5  Minute HR 115     6 Minute HR 119     2 Minute Post HR 79     Interval Heart Rate? Yes       Interval Oxygen   Interval Oxygen? Yes     Baseline Oxygen Saturation % 95 %     1 Minute Oxygen Saturation % 93 %     1 Minute Liters of Oxygen 1.5 L     2 Minute Oxygen Saturation % 91 %     2 Minute Liters of Oxygen 1.5 L     3 Minute Oxygen Saturation % 90 %     3 Minute Liters of Oxygen 1.5 L     4 Minute Oxygen Saturation % 92 %     4 Minute Liters of Oxygen 1.5 L     5 Minute Oxygen Saturation % 91 %     5 Minute Liters of Oxygen 1.5 L     6 Minute Oxygen Saturation % 91 %     6 Minute Liters of Oxygen 1.5 L     2 Minute Post Oxygen Saturation % 95 %     2 Minute Post Liters of Oxygen 1.5 L             Oxygen Initial Assessment:  Oxygen Initial Assessment - 04/18/23 1441       Home Oxygen   Home Oxygen Device Home Concentrator;E-Tanks    Sleep Oxygen Prescription Continuous    Liters per minute 2    Home Exercise Oxygen Prescription Continuous    Liters per minute 1.5    Home Resting Oxygen Prescription Continuous    Liters per minute 1.5   as needed  only when sat drops below 88%   Compliance with Home Oxygen Use Yes      Intervention   Short Term Goals To learn and exhibit compliance with exercise, home and travel O2 prescription;To learn and understand importance of monitoring SPO2 with pulse oximeter and demonstrate accurate use of the pulse oximeter.;To learn and understand importance of maintaining oxygen saturations>88%;To learn and demonstrate proper pursed lip breathing techniques or other breathing techniques. ;To learn and demonstrate proper use of respiratory medications    Long  Term Goals Exhibits compliance with exercise, home  and travel O2 prescription;Verbalizes importance of monitoring SPO2 with pulse oximeter and return demonstration;Maintenance of O2 saturations>88%;Exhibits proper breathing techniques, such as pursed lip breathing or other method taught  during program session;Compliance with respiratory medication;Demonstrates proper use of MDI's             Oxygen Re-Evaluation:   Oxygen Discharge (Final Oxygen Re-Evaluation):   Initial Exercise Prescription:  Initial Exercise Prescription - 04/24/23 1100       Date of Initial Exercise RX and Referring Provider   Date 04/24/23    Referring Provider Dr. Coralie Derrick      Oxygen   Maintain Oxygen Saturation 88% or higher      Recumbant Bike   Level 1    RPM 50    Watts 15    Minutes 15    METs 1.18      NuStep   Level 1    SPM 80  Minutes 15    METs 1.18      Track   Laps 12    Minutes 15    METs 1.65      Prescription Details   Frequency (times per week) 2    Duration Progress to 30 minutes of continuous aerobic without signs/symptoms of physical distress      Intensity   THRR 40-80% of Max Heartrate 103-129    Ratings of Perceived Exertion 11-13    Perceived Dyspnea 0-4      Progression   Progression Continue to progress workloads to maintain intensity without signs/symptoms of physical distress.      Resistance Training   Training Prescription Yes    Weight 3 lb    Reps 10-15             Perform Capillary Blood Glucose checks as needed.  Exercise Prescription Changes:   Exercise Prescription Changes     Row Name 04/24/23 1100             Response to Exercise   Blood Pressure (Admit) 122/64       Blood Pressure (Exercise) 142/66       Blood Pressure (Exit) 120/62       Heart Rate (Admit) 77 bpm       Heart Rate (Exercise) 119 bpm       Heart Rate (Exit) 79 bpm       Oxygen Saturation (Admit) 95 %       Oxygen Saturation (Exercise) 90 %       Oxygen Saturation (Exit) 95 %       Rating of Perceived Exertion (Exercise) 13       Perceived Dyspnea (Exercise) 2       Symptoms none       Comments Results                Exercise Comments:   Exercise Goals and Review:   Exercise Goals     Row Name 04/24/23  1153             Exercise Goals   Increase Physical Activity Yes       Intervention Provide advice, education, support and counseling about physical activity/exercise needs.;Develop an individualized exercise prescription for aerobic and resistive training based on initial evaluation findings, risk stratification, comorbidities and participant's personal goals.       Expected Outcomes Short Term: Attend rehab on a regular basis to increase amount of physical activity.;Long Term: Add in home exercise to make exercise part of routine and to increase amount of physical activity.;Long Term: Exercising regularly at least 3-5 days a week.       Increase Strength and Stamina Yes       Intervention Provide advice, education, support and counseling about physical activity/exercise needs.;Develop an individualized exercise prescription for aerobic and resistive training based on initial evaluation findings, risk stratification, comorbidities and participant's personal goals.       Expected Outcomes Short Term: Increase workloads from initial exercise prescription for resistance, speed, and METs.;Long Term: Improve cardiorespiratory fitness, muscular endurance and strength as measured by increased METs and functional capacity ( );Short Term: Perform resistance training exercises routinely during rehab and add in resistance training at home       Able to understand and use rate of perceived exertion (RPE) scale Yes       Intervention Provide education and explanation on how to use RPE scale       Expected Outcomes Short  Term: Able to use RPE daily in rehab to express subjective intensity level;Long Term:  Able to use RPE to guide intensity level when exercising independently       Able to understand and use Dyspnea scale Yes       Intervention Provide education and explanation on how to use Dyspnea scale       Expected Outcomes Short Term: Able to use Dyspnea scale daily in rehab to express subjective sense  of shortness of breath during exertion;Long Term: Able to use Dyspnea scale to guide intensity level when exercising independently       Knowledge and understanding of Target Heart Rate Range (THRR) Yes       Intervention Provide education and explanation of THRR including how the numbers were predicted and where they are located for reference       Expected Outcomes Short Term: Able to state/look up THRR;Long Term: Able to use THRR to govern intensity when exercising independently;Short Term: Able to use daily as guideline for intensity in rehab       Able to check pulse independently Yes       Intervention Provide education and demonstration on how to check pulse in carotid and radial arteries.;Review the importance of being able to check your own pulse for safety during independent exercise       Expected Outcomes Short Term: Able to explain why pulse checking is important during independent exercise;Long Term: Able to check pulse independently and accurately       Understanding of Exercise Prescription Yes       Intervention Provide education, explanation, and written materials on patient's individual exercise prescription       Expected Outcomes Short Term: Able to explain program exercise prescription;Long Term: Able to explain home exercise prescription to exercise independently                Exercise Goals Re-Evaluation :   Discharge Exercise Prescription (Final Exercise Prescription Changes):  Exercise Prescription Changes - 04/24/23 1100       Response to Exercise   Blood Pressure (Admit) 122/64    Blood Pressure (Exercise) 142/66    Blood Pressure (Exit) 120/62    Heart Rate (Admit) 77 bpm    Heart Rate (Exercise) 119 bpm    Heart Rate (Exit) 79 bpm    Oxygen Saturation (Admit) 95 %    Oxygen Saturation (Exercise) 90 %    Oxygen Saturation (Exit) 95 %    Rating of Perceived Exertion (Exercise) 13    Perceived Dyspnea (Exercise) 2    Symptoms none    Comments  Results             Nutrition:  Target Goals: Understanding of nutrition guidelines, daily intake of sodium 1500mg , cholesterol 200mg , calories 30% from fat and 7% or less from saturated fats, daily to have 5 or more servings of fruits and vegetables.  Education: All About Nutrition: -Group instruction provided by verbal, written material, interactive activities, discussions, models, and posters to present general guidelines for heart healthy nutrition including fat, fiber, MyPlate, the role of sodium in heart healthy nutrition, utilization of the nutrition label, and utilization of this knowledge for meal planning. Follow up email sent as well. Written material given at graduation.   Biometrics:  Pre Biometrics - 04/24/23 1154       Pre Biometrics   Height 4\' 11"  (1.499 m)    Weight 158 lb 11.2 oz (72 kg)    Waist Circumference  40 inches    Hip Circumference 42 inches    Waist to Hip Ratio 0.95 %    BMI (Calculated) 32.04    Single Leg Stand 6.78 seconds              Nutrition Therapy Plan and Nutrition Goals:   Nutrition Assessments:  MEDIFICTS Score Key: >=70 Need to make dietary changes  40-70 Heart Healthy Diet <= 40 Therapeutic Level Cholesterol Diet  Flowsheet Row Pulmonary Rehab from 04/24/2023 in [facility] Cardiac and Pulmonary Rehab  Picture Your Plate Total Score on Admission 77      Picture Your Plate Scores: <16 Unhealthy dietary pattern with much room for improvement. 41-50 Dietary pattern unlikely to meet recommendations for good health and room for improvement. 51-60 More healthful dietary pattern, with some room for improvement.  >60 Healthy dietary pattern, although there may be some specific behaviors that could be improved.   Nutrition Goals Re-Evaluation:   Nutrition Goals Discharge (Final Nutrition Goals Re-Evaluation):   Psychosocial: Target Goals: Acknowledge presence or absence of significant depression and/or stress, maximize coping  skills, provide positive support system. Participant is able to verbalize types and ability to use techniques and skills needed for reducing stress and depression.   Education: Stress, Anxiety, and Depression - Group verbal and visual presentation to define topics covered.  Reviews how body is impacted by stress, anxiety, and depression.  Also discusses healthy ways to reduce stress and to treat/manage anxiety and depression.  Written material given at graduation.   Education: Sleep Hygiene -Provides group verbal and written instruction about how sleep can affect your health.  Define sleep hygiene, discuss sleep cycles and impact of sleep habits. Review good sleep hygiene tips.    Initial Review & Psychosocial Screening:  Initial Psych Review & Screening - 04/18/23 1442       Initial Review   Current issues with None Identified      Family Dynamics   Good Support System? Yes   husband  56 yrs,     Barriers   Psychosocial barriers to participate in program There are no identifiable barriers or psychosocial needs.      Screening Interventions   Interventions To provide support and resources with identified psychosocial needs;Provide feedback about the scores to participant;Encouraged to exercise    Expected Outcomes Short Term goal: Utilizing psychosocial counselor, staff and physician to assist with identification of specific Stressors or current issues interfering with healing process. Setting desired goal for each stressor or current issue identified.;Long Term Goal: Stressors or current issues are controlled or eliminated.;Short Term goal: Identification and review with participant of any Quality of Life or Depression concerns found by scoring the questionnaire.;Long Term goal: The participant improves quality of Life and PHQ9 Scores as seen by post scores and/or verbalization of changes             Quality of Life Scores:  Scores of 19 and below usually indicate a poorer quality  of life in these areas.  A difference of  2-3 points is a clinically meaningful difference.  A difference of 2-3 points in the total score of the Quality of Life Index has been associated with significant improvement in overall quality of life, self-image, physical symptoms, and general health in studies assessing change in quality of life.  PHQ-9: Review Flowsheet  More data exists      04/24/2023 01/22/2023 11/21/2022 07/17/2022 07/04/2022  Depression screen PHQ 2/9  Decreased Interest 0 0 0 0 0 0  Down, Depressed, Hopeless 2 0 0 0 0 0  PHQ - 2 Score 2 0 0 0 0 0  Altered sleeping 2 0 - 0 0  Tired, decreased energy 1 0 - 0 0  Change in appetite 2 0 - 0 0  Feeling bad or failure about yourself  0 0 - 0 0  Trouble concentrating 0 0 - 0 0  Moving slowly or fidgety/restless 0 0 - 0 0  Suicidal thoughts 0 0 - 0 0  PHQ-9 Score 7 0 - 0 0  Difficult doing work/chores Somewhat difficult Not difficult at all - Not difficult at all Not difficult at all    Details       Multiple values from one day are sorted in reverse-chronological order        Interpretation of Total Score  Total Score Depression Severity:  1-4 = Minimal depression, 5-9 = Mild depression, 10-14 = Moderate depression, 15-19 = Moderately severe depression, 20-27 = Severe depression   Psychosocial Evaluation and Intervention:  Psychosocial Evaluation - 04/18/23 1500       Psychosocial Evaluation & Interventions   Interventions Encouraged to exercise with the program and follow exercise prescription    Comments Thre are no barriers to attending the program.  She lives with her husband (62yrs) and he is her support. She is ready to start the program    Expected Outcomes STG attend all scheduled sessions, work on exercise progression as guided by the exercise physiologistLTG maintian exercise prgogression after discharge, looking for decreased shaortness of breath    Continue Psychosocial Services  Follow up required by staff              Psychosocial Re-Evaluation:   Psychosocial Discharge (Final Psychosocial Re-Evaluation):   Education: Education Goals: Education classes will be provided on a weekly basis, covering required topics. Participant will state understanding/return demonstration of topics presented.  Learning Barriers/Preferences:   General Pulmonary Education Topics:  Infection Prevention: - Provides verbal and written material to individual with discussion of infection control including proper hand washing and proper equipment cleaning during exercise session. Flowsheet Row Pulmonary Rehab from 04/24/2023 in [facility] Cardiac and Pulmonary Rehab  Date 04/24/23  Educator NT  Instruction Review Code 1- Verbalizes Understanding       Falls Prevention: - Provides verbal and written material to individual with discussion of falls prevention and safety. Flowsheet Row Pulmonary Rehab from 04/24/2023 in [facility] [facility]  Date 04/18/23  Educator SB  Instruction Review Code 1- Verbalizes Understanding       Chronic Lung Disease Review: - Group verbal instruction with posters, models, PowerPoint presentations and videos,  to review new updates, new respiratory medications, new advancements in procedures and treatments. Providing information on websites and "800" numbers for continued self-education. Includes information about supplement oxygen, available portable oxygen systems, continuous and intermittent flow rates, oxygen safety, concentrators, and Medicare reimbursement for oxygen. Explanation of Pulmonary Drugs, including class, frequency, complications, importance of spacers, rinsing mouth after steroid MDI's, and proper cleaning methods for nebulizers. Review of basic lung anatomy and physiology related to function, structure, and complications of lung disease. Review of risk factors. Discussion about methods for diagnosing sleep apnea and types of masks and machines for OSA.  Includes a review of the use of types of environmental controls: home humidity, furnaces, filters, dust mite/pet prevention, HEPA vacuums. Discussion about weather changes, air quality and the benefits of nasal washing. Instruction on Warning signs, infection symptoms, calling MD  promptly, preventive modes, and value of vaccinations. Review of effective airway clearance, coughing and/or vibration techniques. Emphasizing that all should Create an Action Plan. Written material given at graduation. Flowsheet Row Pulmonary Rehab from 04/24/2023 in [facility] Card[facility] Pul[facility]  Education need identified 04/24/23       AED/CPR: - Group verbal and written instruction with the use of models to demonstrate the basic use of the AED with the basic ABC's of resuscitation.    Anatomy and Cardiac Procedures: - Group verbal and visual presentation and models provide information about basic cardiac anatomy and function. Reviews the testing methods done to diagnose heart disease and the outcomes of the test results. Describes the treatment choices: Medical Management, Angioplasty, or Coronary Bypass Surgery for treating various heart conditions including Myocardial Infarction, Angina, Valve Disease, and Cardiac Arrhythmias.  Written material given at graduation.   Medication Safety: - Group verbal and visual instruction to review commonly prescribed medications for heart and lung disease. Reviews the medication, class of the drug, and side effects. Includes the steps to properly store meds and maintain the prescription regimen.  Written material given at graduation.   Other: -Provides group and verbal instruction on various topics (see comments)   Knowledge Questionnaire Score:  Knowledge Questionnaire Score - 04/24/23 1126       Knowledge Questionnaire Score   Pre Score 15/18              Core Components/Risk Factors/Patient Goals at Admission:  Personal Goals and Risk Factors at Admission -  04/18/23 1451       Core Components/Risk Factors/Patient Goals on Admission    Weight Management Yes    Intervention Weight Management: Develop a combined nutrition and exercise program designed to reach desired caloric intake, while maintaining appropriate intake of nutrient and fiber, sodium and fats, and appropriate energy expenditure required for the weight goal.;Weight Management: Provide education and appropriate resources to help participant work on and attain dietary goals.    Admit Weight 160 lb (72.6 kg)    Goal Weight: Short Term 159 lb (72.1 kg)    Goal Weight: Long Term 130 lb (59 kg)    Expected Outcomes Short Term: Continue to assess and modify interventions until short term weight is achieved;Long Term: Adherence to nutrition and physical activity/exercise program aimed toward attainment of established weight goal;Weight Loss: Understanding of general recommendations for a balanced deficit meal plan, which promotes 1-2 lb weight loss per week and includes a negative energy balance of [phone] kcal/d    Improve shortness of breath with ADL's Yes    Intervention Provide education, individualized exercise plan and daily activity instruction to help decrease symptoms of SOB with activities of daily living.    Expected Outcomes Short Term: Improve cardiorespiratory fitness to achieve a reduction of symptoms when performing ADLs;Long Term: Be able to perform more ADLs without symptoms or delay the onset of symptoms    Increase knowledge of respiratory medications and ability to use respiratory devices properly  Yes    Intervention Provide education and demonstration as needed of appropriate use of medications, inhalers, and oxygen therapy.    Expected Outcomes Short Term: Achieves understanding of medications use. Understands that oxygen is a medication prescribed by physician. Demonstrates appropriate use of inhaler and oxygen therapy.;Long Term: Maintain appropriate use of medications,  inhalers, and oxygen therapy.    Hypertension Yes    Intervention Provide education on lifestyle modifcations including regular physical activity/exercise, weight management, moderate sodium restriction and increased  consumption of fresh fruit, vegetables, and low fat dairy, alcohol moderation, and smoking cessation.;Monitor prescription use compliance.    Expected Outcomes Short Term: Continued assessment and intervention until BP is < 140/56mm HG in hypertensive participants. < 130/1mm HG in hypertensive participants with diabetes, heart failure or chronic kidney disease.;Long Term: Maintenance of blood pressure at goal levels.    Lipids Yes    Intervention Provide education and support for participant on nutrition & aerobic/resistive exercise along with prescribed medications to achieve LDL 70mg , HDL >40mg .    Expected Outcomes Short Term: Participant states understanding of desired cholesterol values and is compliant with medications prescribed. Participant is following exercise prescription and nutrition guidelines.;Long Term: Cholesterol controlled with medications as prescribed, with individualized exercise RX and with personalized nutrition plan. Value goals: LDL < 70mg , HDL > 40 mg.             Education:Diabetes - Individual verbal and written instruction to review signs/symptoms of diabetes, desired ranges of glucose level fasting, after meals and with exercise. Acknowledge that pre and post exercise glucose checks will be done for 3 sessions at entry of program.   Know Your Numbers and Heart Failure: - Group verbal and visual instruction to discuss disease risk factors for cardiac and pulmonary disease and treatment options.  Reviews associated critical values for Overweight/Obesity, Hypertension, Cholesterol, and Diabetes.  Discusses basics of heart failure: signs/symptoms and treatments.  Introduces Heart Failure Zone chart for action plan for heart failure.  Written material given  at graduation.   Core Components/Risk Factors/Patient Goals Review:    Core Components/Risk Factors/Patient Goals at Discharge (Final Review):    ITP Comments:  ITP Comments     Row Name 04/18/23 1459 04/24/23 1124         ITP Comments Virtual orientation call completed today. shehas an appointment on Date: 04/24/2023  for EP eval and gym Orientation.  Documentation of diagnosis can be found in [facility] 03/08/2023 . Completed and gym orientation for respiratory care services. Initial ITP created and sent for review to Dr. Faud Aleskerov, Medical Director.               Comments: Initial ITP

## 2023-04-24 NOTE — Progress Notes (Signed)
 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 Sometimes will have diet pepsi. She usually eats 3 meals per day. Lunch being her largest meal. She says she usually has veggies at least 1 time per day and likes to snack on fruit. Reviewed mediterranean diet handout. Educated on types of fats, sources, and how to read labels. She watches her sodium intake and says most days she feels she eats well, but sometimes she wants a hotdog. Encouraged and supported making change that are sustainable and allows for some indulgences.    Goal 1: Eat 15-30gProtein and 30-60gCarbs at each meal. Goal 2: Read labels and reduce sodium intake to below 2300mg . Ideally 1500mg  per day.  Goal 3: Drink 48-64oz of water daily  End time 10:37 AM

## 2023-04-24 NOTE — Patient Instructions (Addendum)
 Patient Instructions  Patient Details  Name: Teresa Jensen MRN: [id number] Date of Birth: January 26, 1945 Referring Provider:  Marc Senior, MD  Below are your personal goals for exercise, nutrition, and risk factors. Our goal is to help you stay on track towards obtaining and maintaining these goals. We will be discussing your progress on these goals with you throughout the program.  Initial Exercise Prescription:  Initial Exercise Prescription - 04/24/23 1100       Date of Initial Exercise RX and Referring Provider   Date 04/24/23    Referring Provider Dr. Coralie Derrick      Oxygen   Maintain Oxygen Saturation 88% or higher      Recumbant Bike   Level 1    RPM 50    Watts 15    Minutes 15    METs 1.18      NuStep   Level 1    SPM 80    Minutes 15    METs 1.18      Track   Laps 12    Minutes 15    METs 1.65      Prescription Details   Frequency (times per week) 2    Duration Progress to 30 minutes of continuous aerobic without signs/symptoms of physical distress      Intensity   THRR 40-80% of Max Heartrate 103-129    Ratings of Perceived Exertion 11-13    Perceived Dyspnea 0-4      Progression   Progression Continue to progress workloads to maintain intensity without signs/symptoms of physical distress.      Resistance Training   Training Prescription Yes    Weight 3 lb    Reps 10-15             Exercise Goals: Frequency: Be able to perform aerobic exercise two to three times per week in program working toward 2-5 days per week of home exercise.  Intensity: Work with a perceived exertion of 11 (fairly light) - 15 (hard) while following your exercise prescription.  We will make changes to your prescription with you as you progress through the program.   Duration: Be able to do 30 to 45 minutes of continuous aerobic exercise in addition to a 5 minute warm-up and a 5 minute cool-down routine.   Nutrition Goals: Your personal nutrition goals  will be established when you do your nutrition analysis with the dietician.  The following are general nutrition guidelines to follow: Cholesterol < 200mg /day Sodium < 1500mg /day Fiber: Women over 50 yrs - 21 grams per day  Personal Goals:  Personal Goals and Risk Factors at Admission - 04/18/23 1451       Core Components/Risk Factors/Patient Goals on Admission    Weight Management Yes    Intervention Weight Management: Develop a combined nutrition and exercise program designed to reach desired caloric intake, while maintaining appropriate intake of nutrient and fiber, sodium and fats, and appropriate energy expenditure required for the weight goal.;Weight Management: Provide education and appropriate resources to help participant work on and attain dietary goals.    Admit Weight 160 lb (72.6 kg)    Goal Weight: Short Term 159 lb (72.1 kg)    Goal Weight: Long Term 130 lb (59 kg)    Expected Outcomes Short Term: Continue to assess and modify interventions until short term weight is achieved;Long Term: Adherence to nutrition and physical activity/exercise program aimed toward attainment of established weight goal;Weight Loss: Understanding of general recommendations for a balanced deficit  meal plan, which promotes 1-2 lb weight loss per week and includes a negative energy balance of [phone] kcal/d    Improve shortness of breath with ADL's Yes    Intervention Provide education, individualized exercise plan and daily activity instruction to help decrease symptoms of SOB with activities of daily living.    Expected Outcomes Short Term: Improve cardiorespiratory fitness to achieve a reduction of symptoms when performing ADLs;Long Term: Be able to perform more ADLs without symptoms or delay the onset of symptoms    Increase knowledge of respiratory medications and ability to use respiratory devices properly  Yes    Intervention Provide education and demonstration as needed of appropriate use of  medications, inhalers, and oxygen therapy.    Expected Outcomes Short Term: Achieves understanding of medications use. Understands that oxygen is a medication prescribed by physician. Demonstrates appropriate use of inhaler and oxygen therapy.;Long Term: Maintain appropriate use of medications, inhalers, and oxygen therapy.    Hypertension Yes    Intervention Provide education on lifestyle modifcations including regular physical activity/exercise, weight management, moderate sodium restriction and increased consumption of fresh fruit, vegetables, and low fat dairy, alcohol moderation, and smoking cessation.;Monitor prescription use compliance.    Expected Outcomes Short Term: Continued assessment and intervention until BP is < 140/80mm HG in hypertensive participants. < 130/42mm HG in hypertensive participants with diabetes, heart failure or chronic kidney disease.;Long Term: Maintenance of blood pressure at goal levels.    Lipids Yes    Intervention Provide education and support for participant on nutrition & aerobic/resistive exercise along with prescribed medications to achieve LDL 70mg , HDL >40mg .    Expected Outcomes Short Term: Participant states understanding of desired cholesterol values and is compliant with medications prescribed. Participant is following exercise prescription and nutrition guidelines.;Long Term: Cholesterol controlled with medications as prescribed, with individualized exercise RX and with personalized nutrition plan. Value goals: LDL < 70mg , HDL > 40 mg.            Exercise Goals and Review:  Exercise Goals     Row Name 04/24/23 1153             Exercise Goals   Increase Physical Activity Yes       Intervention Provide advice, education, support and counseling about physical activity/exercise needs.;Develop an individualized exercise prescription for aerobic and resistive training based on initial evaluation findings, risk stratification, comorbidities and  participant's personal goals.       Expected Outcomes Short Term: Attend rehab on a regular basis to increase amount of physical activity.;Long Term: Add in home exercise to make exercise part of routine and to increase amount of physical activity.;Long Term: Exercising regularly at least 3-5 days a week.       Increase Strength and Stamina Yes       Intervention Provide advice, education, support and counseling about physical activity/exercise needs.;Develop an individualized exercise prescription for aerobic and resistive training based on initial evaluation findings, risk stratification, comorbidities and participant's personal goals.       Expected Outcomes Short Term: Increase workloads from initial exercise prescription for resistance, speed, and METs.;Long Term: Improve cardiorespiratory fitness, muscular endurance and strength as measured by increased METs and functional capacity ( );Short Term: Perform resistance training exercises routinely during rehab and add in resistance training at home       Able to understand and use rate of perceived exertion (RPE) scale Yes       Intervention Provide education and explanation on how to use RPE  scale       Expected Outcomes Short Term: Able to use RPE daily in rehab to express subjective intensity level;Long Term:  Able to use RPE to guide intensity level when exercising independently       Able to understand and use Dyspnea scale Yes       Intervention Provide education and explanation on how to use Dyspnea scale       Expected Outcomes Short Term: Able to use Dyspnea scale daily in rehab to express subjective sense of shortness of breath during exertion;Long Term: Able to use Dyspnea scale to guide intensity level when exercising independently       Knowledge and understanding of Target Heart Rate Range (THRR) Yes       Intervention Provide education and explanation of THRR including how the numbers were predicted and where they are located for  reference       Expected Outcomes Short Term: Able to state/look up THRR;Long Term: Able to use THRR to govern intensity when exercising independently;Short Term: Able to use daily as guideline for intensity in rehab       Able to check pulse independently Yes       Intervention Provide education and demonstration on how to check pulse in carotid and radial arteries.;Review the importance of being able to check your own pulse for safety during independent exercise       Expected Outcomes Short Term: Able to explain why pulse checking is important during independent exercise;Long Term: Able to check pulse independently and accurately       Understanding of Exercise Prescription Yes       Intervention Provide education, explanation, and written materials on patient's individual exercise prescription       Expected Outcomes Short Term: Able to explain program exercise prescription;Long Term: Able to explain home exercise prescription to exercise independently

## 2023-04-30 ENCOUNTER — Encounter: Admitting: *Deleted

## 2023-04-30 DIAGNOSIS — J4489 Other specified chronic obstructive pulmonary disease: Secondary | ICD-10-CM

## 2023-04-30 DIAGNOSIS — Z5189 Encounter for other specified aftercare: Secondary | ICD-10-CM | POA: Diagnosis not present

## 2023-04-30 DIAGNOSIS — J8410 Pulmonary fibrosis, unspecified: Secondary | ICD-10-CM

## 2023-04-30 NOTE — Progress Notes (Signed)
 Daily Session Note  Patient Details  Name: Teresa Jensen MRN: [id number] Date of Birth: 1945-06-10 Referring Provider:   Gattis Kass Pulmonary Rehab from 04/24/2023 in [facility] Cardiac [facility] Pulmonary Rehab  Referring Provider Dr. Coralie Derrick       Encounter Date: 04/30/2023  Check In:  Session Check In - 04/30/23 0825       Check-In   Supervising physician immediately available to respond to emergencies See telemetry face sheet for immediately available ER MD    Location [facility]-Cardiac & Pulmonary Rehab    Staff Present Lyell Samuel, MS, Exercise Physiologist;Noah Tickle, BS, Exercise Physiologist;Gamaliel Charney, RN, BSN, CCRP    Virtual Visit No    Medication changes reported     No    Fall or balance concerns reported    No    Warm-up and Cool-down Performed on first and last piece of equipment    Resistance Training Performed Yes    VAD Patient? No    PAD/SET Patient? No      Pain Assessment   Currently in Pain? No/denies                Social History   Tobacco Use  Smoking Status Former   Current packs/day: 0.00   Average packs/day: 1.5 packs/day for 20.0 years (30.0 ttl pk-yrs)   Types: Cigarettes   Start date: 02/11/1989   Quit date: 02/11/2009   Years since quitting: 14.2  Smokeless Tobacco Never    Goals Met:  Proper associated with RPD/PD & O2 Sat Exercise tolerated well Personal goals reviewed No report of concerns or symptoms today  Goals Unmet:  Not Applicable  Comments: Pt able to follow exercise prescription today without complaint.  Will continue to monitor for progression. First full day of exercise!  Patient was oriented to gym and equipment including functions, settings, policies, and procedures.  Patient's individual exercise prescription and treatment plan were reviewed.  All starting workloads were established based on the results of the 6 minute walk test done at initial orientation visit.  The plan for exercise progression was also  introduced and progression will be customized based on patient's performance and goals.    Dr. Firman Hughes is Medical Director for [facility] Cardiac Rehabilitation.  Dr. Fuad Aleskerov is Medical Director for [facility] Pulmonary Rehabilitation.

## 2023-05-02 ENCOUNTER — Other Ambulatory Visit: Payer: Self-pay | Admitting: Family

## 2023-05-02 ENCOUNTER — Encounter: Attending: Pulmonary Disease | Admitting: *Deleted

## 2023-05-02 DIAGNOSIS — J8410 Pulmonary fibrosis, unspecified: Secondary | ICD-10-CM | POA: Insufficient documentation

## 2023-05-02 DIAGNOSIS — I5032 Chronic diastolic (congestive) heart failure: Secondary | ICD-10-CM | POA: Diagnosis present

## 2023-05-02 DIAGNOSIS — J4489 Other specified chronic obstructive pulmonary disease: Secondary | ICD-10-CM | POA: Diagnosis present

## 2023-05-02 NOTE — Progress Notes (Signed)
 Daily Session Note  Patient Details  Name: Teresa Jensen MRN: [id number] Date of Birth: 1945/07/10 Referring Provider:   Gattis Kass Pulmonary Rehab from 04/24/2023 in [facility] Cardiac and Pulmonary Rehab  Referring Provider Dr. Coralie Derrick       Encounter Date: 05/02/2023  Check In:  Session Check In - 05/02/23 0803       Check-In   Supervising physician immediately available to respond to emergencies See telemetry face sheet for immediately available ER MD    Location [facility]-Cardiac & Pulmonary Rehab    Staff Present Gideon Kussmaul RDN,LDN;Kaniya Trueheart, RN, BSN, CCRP;Joseph Hood RCP,RRT,BSRT    Virtual Visit No    Medication changes reported     No    Fall or balance concerns reported    No    Warm-up and Cool-down Performed on first and last piece of equipment    Resistance Training Performed Yes    VAD Patient? No    PAD/SET Patient? No      Pain Assessment   Currently in Pain? No/denies                Social History   Tobacco Use  Smoking Status Former   Current packs/day: 0.00   Average packs/day: 1.5 packs/day for 20.0 years (30.0 ttl pk-yrs)   Types: Cigarettes   Start date: 02/11/1989   Quit date: 02/11/2009   Years since quitting: 14.2  Smokeless Tobacco Never    Goals Met:  Proper associated with RPD/PD & O2 Sat Exercise tolerated well No report of concerns or symptoms today  Goals Unmet:  Not Applicable  Comments: Pt able to follow exercise prescription today without complaint.  Will continue to monitor for progression.    Dr. Firman Hughes is Medical Director for [facility] Cardiac Rehabilitation.  Dr. Fuad Aleskerov is Medical Director for [facility] Pulmonary Rehabilitation.

## 2023-05-07 ENCOUNTER — Encounter: Admitting: *Deleted

## 2023-05-07 DIAGNOSIS — J4489 Other specified chronic obstructive pulmonary disease: Secondary | ICD-10-CM | POA: Diagnosis not present

## 2023-05-07 DIAGNOSIS — J8410 Pulmonary fibrosis, unspecified: Secondary | ICD-10-CM

## 2023-05-07 NOTE — Progress Notes (Signed)
 Daily Session Note  Patient Details  Name: Teresa Jensen MRN: [id number] Date of Birth: August 01, 1945 Referring Provider:   Gattis Kass Pulmonary Rehab from 04/24/2023 in [facility] Cardiac and Pulmonary Rehab  Referring Provider Dr. Coralie Derrick       Encounter Date: 05/07/2023  Check In:  Session Check In - 05/07/23 0804       Check-In   Supervising physician immediately available to respond to emergencies See telemetry face sheet for immediately available ER MD    Location [facility]-Cardiac & Pulmonary Rehab    Staff Present Lyell Samuel, MS, Exercise Physiologist;Ramesses Crampton, RN, BSN, CCRP;Noah Tickle, BS, Exercise Physiologist    Virtual Visit No    Medication changes reported     No    Fall or balance concerns reported    No    Warm-up and Cool-down Performed on first and last piece of equipment    Resistance Training Performed Yes    VAD Patient? No    PAD/SET Patient? No      Pain Assessment   Currently in Pain? No/denies                Social History   Tobacco Use  Smoking Status Former   Current packs/day: 0.00   Average packs/day: 1.5 packs/day for 20.0 years (30.0 ttl pk-yrs)   Types: Cigarettes   Start date: 02/11/1989   Quit date: 02/11/2009   Years since quitting: 14.2  Smokeless Tobacco Never    Goals Met:  Proper associated with RPD/PD & O2 Sat Independence with exercise equipment Exercise tolerated well No report of concerns or symptoms today  Goals Unmet:  Not Applicable  Comments: Pt able to follow exercise prescription today without complaint.  Will continue to monitor for progression.    Dr. Firman Hughes is Medical Director for [facility] Cardiac Rehabilitation.  Dr. Fuad Aleskerov is Medical Director for [facility] Pulmonary Rehabilitation.

## 2023-05-08 ENCOUNTER — Other Ambulatory Visit: Payer: Self-pay | Admitting: Primary Care

## 2023-05-08 ENCOUNTER — Ambulatory Visit: Payer: Medicare Other

## 2023-05-08 VITALS — Ht <= 58 in | Wt 158.0 lb

## 2023-05-08 DIAGNOSIS — Z0000 Encounter for general adult medical examination without abnormal findings: Secondary | ICD-10-CM | POA: Diagnosis not present

## 2023-05-08 DIAGNOSIS — K219 Gastro-esophageal reflux disease without esophagitis: Secondary | ICD-10-CM

## 2023-05-08 DIAGNOSIS — J8410 Pulmonary fibrosis, unspecified: Secondary | ICD-10-CM

## 2023-05-08 DIAGNOSIS — J4489 Other specified chronic obstructive pulmonary disease: Secondary | ICD-10-CM

## 2023-05-08 DIAGNOSIS — J449 Chronic obstructive pulmonary disease, unspecified: Secondary | ICD-10-CM

## 2023-05-08 NOTE — Patient Instructions (Addendum)
 Teresa Jensen , Thank you for taking time to come for your Medicare Wellness Visit. I appreciate your ongoing commitment to your health goals. Please review the following plan we discussed and let me know if I can assist you in the future.   Referrals/Orders/Follow-Ups/Clinician Recommendations: none  This is a list of the screening recommended for you and due dates:  Health Maintenance  Topic Date Due   Flu Shot  08/02/2023   Medicare Annual Wellness Visit  05/07/2024   DEXA scan (bone density measurement)  Completed   Hepatitis C Screening  Completed   HPV Vaccine  Aged Out   Meningitis B Vaccine  Aged Out   Screening for Lung Cancer  Discontinued   DTaP/Tdap/Td vaccine  Discontinued   Pneumonia Vaccine  Discontinued   Colon Cancer Screening  Discontinued   COVID-19 Vaccine  Discontinued   Zoster (Shingles) Vaccine  Discontinued    Advanced directives: (Copy Requested) Please bring a copy of your health care power of attorney and living will to the office to be added to your chart at your convenience. You can mail to [facility] [address] [place], [name] [address] or email to [email or web address]  Next Medicare Annual Wellness Visit scheduled for next year: Yes 05/08/24 @ 8:50am televisit

## 2023-05-08 NOTE — Progress Notes (Signed)
 30 Day review completed. Medical Director ITP review done, changes made as directed, and signed approval by Medical Director. New to prgroam

## 2023-05-08 NOTE — Progress Notes (Signed)
 Subjective:   Teresa Jensen is a 78 y.o. who presents for a Medicare Wellness preventive visit.  Visit Complete: Virtual I connected with  Lekita E Cirrincione on 05/08/23 by a audio enabled telemedicine application and verified that I am speaking with the correct person using two identifiers.  Patient Location: Home  Provider Location: Office/Clinic  I discussed the limitations of evaluation and management by telemedicine. The patient expressed understanding and agreed to proceed.  Vital Signs: Because this visit was a virtual/telehealth visit, some criteria may be missing or patient reported. Any vitals not documented were not able to be obtained and vitals that have been documented are patient reported.  VideoDeclined- This patient declined Librarian, academic. Therefore the visit was completed with audio only.  Persons Participating in Visit: Patient.  AWV Questionnaire: Yes: Patient Medicare AWV questionnaire was completed by the patient on 05/01/23; I have confirmed that all information answered by patient is correct and no changes since this date.  Cardiac Risk Factors include: advanced age (>60men, >9 women);dyslipidemia;hypertension;obesity (BMI >30kg/m2)     Objective:    Today's Vitals   05/08/23 1538  Weight: 158 lb (71.7 kg)  Height: 4\' 9"  (1.448 m)   Body mass index is 34.19 kg/m.     05/08/2023    3:51 PM 04/18/2023    2:38 PM 01/28/2023    9:27 AM 09/07/2022    4:04 PM 09/06/2022   10:37 AM 07/19/2022    9:53 AM 05/07/2022    1:36 PM  Advanced Directives  Does Patient Have a Medical Advance Directive? No No No  No No No  Would patient like information on creating a medical advance directive?  Yes (MAU/Ambulatory/Procedural Areas - Information given) No - Patient declined No - Patient declined  No - Patient declined No - Patient declined    Current Medications (verified) Outpatient Encounter Medications as of 05/08/2023  Medication Sig    atorvastatin  (LIPITOR) 10 MG tablet TAKE 1 TABLET BY MOUTH DAILY. FOR CHOLESTEROL   Calcium  Carb-Cholecalciferol (CALCIUM  + VITAMIN D3 PO) Take 1,200 mg by mouth daily.   dapagliflozin propanediol  (FARXIGA ) 10 MG TABS tablet TAKE 1 TABLET BY MOUTH DAILY BEFORE BREAKFAST.   famotidine  (PEPCID ) 20 MG tablet TAKE 1 TABLET BY MOUTH AFTER SUPPER   furosemide  (LASIX ) 40 MG tablet Take 1 tablet (40 mg total) by mouth daily.   Glycopyrrolate -Formoterol  (BEVESPI  AEROSPHERE) 9-4.8 MCG/ACT AERO Inhale 2 puffs into the lungs 2 (two) times daily.   levalbuterol  (XOPENEX  HFA) 45 MCG/ACT inhaler Inhale 2 puffs into the lungs every 4 (four) hours as needed for wheezing or shortness of breath.   levothyroxine  (SYNTHROID ) 75 MCG tablet TAKE 1 EVERY AM ON AN EMPTY STOMACH WITH WATER ONLY. NO FOOD OR OTHER MEDICATIONS FOR 30 MINUTES.   losartan  (COZAAR ) 100 MG tablet TAKE 1 TABLET (100 MG TOTAL) BY MOUTH DAILY. PLEASE CALL [phone] TO SCHEDULE A YEARLY APPOINTMENT. THANK YOU.   Multiple Vitamins-Minerals (PRESERVISION AREDS 2 PO) Take 1 tablet by mouth in the morning and at bedtime.   nystatin  cream (MYCOSTATIN ) Apply 1 Application topically 2 (two) times daily.   pantoprazole  (PROTONIX ) 40 MG tablet Take 1 tablet (40 mg total) by mouth daily. for heartburn.   pentoxifylline  (TRENTAL ) 400 MG CR tablet Take 1 tablet (400 mg total) by mouth in the morning and at bedtime.   spironolactone  (ALDACTONE ) 25 MG tablet Take 0.5 tablets (12.5 mg total) by mouth daily.   tacrolimus  (PROTOPIC ) 0.1 % ointment Apply  to aa's lower legs BID PRN.   alendronate  (FOSAMAX ) 70 MG tablet Take 70 mg by mouth once a week. Take with a full glass of water on an empty stomach. (Patient not taking: Reported on 05/08/2023)   No facility-administered encounter medications on file as of 05/08/2023.    Allergies (verified) Epinephrine , Food, Mangifera indica, Other, Prednisone , Ivp dye [iodinated contrast media], Lactose intolerance (gi),  Pneumococcal 13-val conj vacc, and Prevnar 13 [pneumococcal 13-val conj vacc]   History: Past Medical History:  Diagnosis Date   (HFpEF) heart failure with preserved ejection fraction (HCC) 06/28/2011   a.) TTE 06/28/2011: EF 55-60%, mild AR, G1DD; b.) TTE 01/16/2013: EF 55-60%, mild MR; c.) TTE 02/16/2018: EF 60-65%, G1DD; d.) MPI 11/26/2018: EF 56%   Abnormal electrocardiogram 11/12/2018   Acute kidney failure (HCC) 01/12/2017   Acute on chronic respiratory failure with hypoxia (HCC) 09/06/2022   Adenoma of left adrenal gland    Anemia    Anxiety    Aortic atherosclerosis (HCC)    Bilateral carpal tunnel syndrome    CAD (coronary artery disease)    a.) HR CT chest 10/30/2021: 2 vessel CAD (LAD/RCA)   CAP (community acquired pneumonia)    COPD (chronic obstructive pulmonary disease) (HCC)    COPD exacerbation (HCC) 01/21/2018   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   Diverticulosis    Dyspnea    DUE TO HEAT WITH COPD   Epistaxis 01/09/2017   GERD (gastroesophageal reflux disease)    Herpes zoster 07/08/2015   Hypertension    Hypothyroidism    Iatrogenic hypotension 01/12/2017   Iliac artery stenosis, bilateral (HCC)    Morbid obesity with BMI of 40.0-44.9, adult (HCC)    New onset of headaches    Osteopenia    PMR (polymyalgia rheumatica) (HCC)    Pneumonia 02/13/2018   PONV (postoperative nausea and vomiting)    Pulmonary nodule, left    a.) CT chest 10/30/2021: 1.5 cm medial LUL nodule; b.) PET CT 11/21/2021: hypermetabolic LUL nodule (SUX max 9.3)   S/P carpal tunnel release 08/04/2020   Shingles    Stasis dermatitis    Strain of chest wall 01/20/2011   Tobacco abuse    Vitamin D  deficiency    Past Surgical History:  Procedure Laterality Date   ANTERIOR CERVICAL DECOMP/DISCECTOMY FUSION  06/22/2004   C4-C6   APPENDECTOMY     CARPAL TUNNEL RELEASE Left 08/31/2019   Procedure: CARPAL TUNNEL RELEASE;  Surgeon: Arlyne Lame, MD;  Location: [facility] ORS;  Service:  Orthopedics;  Laterality: Left;   CARPAL TUNNEL RELEASE Right 06/22/2020   Procedure: CARPAL TUNNEL RELEASE;  Surgeon: Arlyne Lame, MD;  Location: [facility] OR[facility];  Service: Orthopedics;  Laterality: Right;   CHOLECYSTECTOMY     COLONOSCOPY     ENDOBRONCHIAL ULTRASOUND Left 01/17/2022   Procedure: ENDOBRONCHIAL ULTRASOUND;  Surgeon: Marc Senior, MD;  Location: [facility] OR[facility];  Service: Pulmonary;  Laterality: Left;   ESOPHAGOGASTRODUODENOSCOPY     HEMORRHOID SURGERY     TOTAL ABDOMINAL HYSTERECTOMY W/ BILATERAL SALPINGOOPHORECTOMY     TUBAL LIGATION     Family History  Problem Relation Age of Onset   Heart attack Mother 24   Cancer Father        lung/chest wall    Prostate cancer Father    Diabetes Brother        siblings   Hypertension Sister        x 2   Heart disease Brother    Breast cancer  Paternal Grandmother    Heart disease Sister    Diabetes Sister    Tuberculosis Sister    Cirrhosis Brother    Social History   Socioeconomic History   Marital status: Married    Spouse name: [name]   Number of children: 1   Years of education: Not on file   Highest education level: 12th grade  Occupational History   Occupation: Catering manager as a Paramedic: RETIRED    Comment: retired  Tobacco Use   Smoking status: Former    Current packs/day: 0.00    Average packs/day: 1.5 packs/day for 20.0 years (30.0 ttl pk-yrs)    Types: Cigarettes    Start date: 02/11/1989    Quit date: 02/11/2009    Years since quitting: 14.2   Smokeless tobacco: Never  Vaping Use   Vaping status: Never Used  Substance and Sexual Activity   Alcohol use: No    Alcohol/week: 0.0 standard drinks of alcohol   Drug use: No   Sexual activity: Not Currently  Other Topics Concern   Not on file  Social History Narrative   Lives in [place] with her husband.  Takes care of her two grandchildren, ages 36 and 2, everyday in [place].  Has one son who is 53.   Desires CPR   Would not want  prolonged life support if futile   Social Drivers of Health   Financial Resource Strain: Patient Declined (05/01/2023)   Overall Financial Resource Strain (CARDIA)    Difficulty of Paying Living Expenses: Patient declined  Food Insecurity: Patient Declined (05/01/2023)   Hunger Vital Sign    Worried About Running Out of Food in the Last Year: Patient declined    Ran Out of Food in the Last Year: Patient declined  Transportation Needs: Patient Declined (05/01/2023)   PRAPARE - Administrator, Civil Service (Medical): Patient declined    Lack of Transportation (Non-Medical): Patient declined  Physical Activity: Sufficiently Active (05/01/2023)   Exercise Vital Sign    Days of Exercise per Week: 3 days    Minutes of Exercise per Session: 60 min  Stress: No Stress Concern Present (05/01/2023)   [employer] of Occupational Health - Occupational Stress Questionnaire    Feeling of Stress : Only a little  Social Connections: Unknown (05/01/2023)   Social Connection and Isolation Panel [NHANES]    Frequency of Communication with Friends and Family: More than three times a week    Frequency of Social Gatherings with Friends and Family: More than three times a week    Attends Religious Services: Patient declined    Database administrator or Organizations: Patient declined    Attends Engineer, structural: Not on file    Marital Status: Married    Tobacco Counseling Counseling given: Not Answered    Clinical Intake:  Pre-visit preparation completed: Yes  Pain : No/denies pain    BMI - recorded: 27.12 Nutritional Status: BMI 25 -29 Overweight Nutritional Risks: None Diabetes: No  Lab Results  Component Value Date   HGBA1C 5.5 02/22/2022   HGBA1C 5.3 02/28/2021   HGBA1C 6.4 10/27/2019     How often do you need to have someone help you when you read instructions, pamphlets, or other written materials from your doctor or pharmacy?: 1 - Never  Interpreter  Needed?: No  Comments: lives with husband Information entered by :: B.Emerita Berkemeier,LPN   Activities of Daily Living     05/01/2023  9:25 AM 09/07/2022    4:08 PM  In your present state of health, do you have any difficulty performing the following activities:  Hearing? 0 0  Vision? 0 0  Difficulty concentrating or making decisions? 0 0  Walking or climbing stairs? 0 0  Dressing or bathing? 0 0  Doing errands, shopping? 0 0  Preparing Food and eating ? N   Using the Toilet? N   In the past six months, have you accidently leaked urine? Y   Do you have problems with loss of bowel control? N   Managing your Medications? N   Managing your Finances? N   Housekeeping or managing your Housekeeping? N     Patient Care Team: Gabriel John, NP as PCP - General (Internal Medicine) End, Veryl Gottron, MD as PCP - Cardiology (Cardiology) Rosan Comfort, MD as Consulting Physician (Neurology) Clair Crews, MD as Referring Physician (Ophthalmology) [facility], [place] , MD (Inactive) as Consulting Physician (Dermatology) Marc Senior, MD as Consulting Physician (Pulmonary Disease)  Indicate any recent Medical Services you may have received from other than [facility] providers in the past year (date may be approximate).     Assessment:   This is a routine wellness examination for Aizlee.  Hearing/Vision screen Hearing Screening - Comments:: Pt says her hearing is good Vision Screening - Comments:: Pt says she wears glasses: has MCD in lft eye  Dr Merrell Abate   Goals Addressed             This Visit's Progress    Increase physical activity   On track    Goes to PT 2x week; I will continue to walk at least 15 min daily.      Patient advised to follow up with AWV and Vaccines   On track      Depression Screen     05/08/2023    3:46 PM 04/24/2023   11:30 AM 01/22/2023    8:45 AM 01/22/2023    8:44 AM 11/21/2022   11:36 AM 07/17/2022   12:02 PM 07/04/2022    8:15 AM  PHQ 2/9  Scores  PHQ - 2 Score 1 2 0 0 0 0 0  PHQ- 9 Score  7 0   0 0    Fall Risk     05/01/2023    9:25 AM 04/18/2023    2:37 PM 01/22/2023    8:44 AM 11/21/2022   11:36 AM 07/17/2022   12:02 PM  Fall Risk   Falls in the past year? 0 0 0 0 0  Number falls in past yr:  0 0 0 0  Injury with Fall?  0 0 0 0  Risk for fall due to : No Fall Risks Medication side effect No Fall Risks No Fall Risks No Fall Risks  Follow up Education provided;Falls prevention discussed Falls evaluation completed;Education provided;Falls prevention discussed Falls evaluation completed Falls evaluation completed Falls evaluation completed    MEDICARE RISK AT HOME:  Medicare Risk at Home Any stairs in or around the home?: (Patient-Rptd) Yes If so, are there any without handrails?: (Patient-Rptd) Yes Home free of loose throw rugs in walkways, pet beds, electrical cords, etc?: (Patient-Rptd) No Adequate lighting in your home to reduce risk of falls?: (Patient-Rptd) Yes Life alert?: (Patient-Rptd) No Use of a cane, walker or w/c?: (Patient-Rptd) No Grab bars in the bathroom?: (Patient-Rptd) No Shower chair or bench in shower?: (Patient-Rptd) Yes Elevated toilet seat or a handicapped toilet?: (Patient-Rptd) No  TIMED UP AND GO:  Was the test performed?  No  Cognitive Function: 6CIT completed    10/13/2015    9:30 AM  MMSE - Mini Mental State Exam  Orientation to time 5  Orientation to Place 5  Registration 3  Attention/ Calculation 0  Recall 3  Language- name 2 objects 0  Language- repeat 1  Language- follow 3 step command 3  Language- read & follow direction 0  Write a sentence 0  Copy design 0  Total score 20        05/08/2023    3:53 PM 05/07/2022    1:40 PM 05/01/2021    2:31 PM  6CIT Screen  What Year? 0 points 0 points 0 points  What month? 0 points 0 points 0 points  What time? 0 points 0 points 0 points  Count back from 20 0 points 0 points 0 points  Months in reverse 2 points 0 points 2  points  Repeat phrase 0 points 0 points 0 points  Total Score 2 points 0 points 2 points    Immunizations Immunization History  Administered Date(s) Administered   Fluad Quad(high Dose 65+) 09/02/2020, 10/12/2021   Fluad Trivalent(High Dose 65+) 10/19/2022   Influenza Whole 10/01/2008   Influenza,inj,Quad PF,6+ Mos 10/12/2014, 10/13/2015, 10/17/2016, 10/17/2017, 10/24/2018, 10/27/2019   PFIZER(Purple Top)SARS-COV-2 Vaccination 03/11/2019, 04/01/2019   Pneumococcal Conjugate-13 10/12/2014   Td 10/01/2008    Screening Tests Health Maintenance  Topic Date Due   INFLUENZA VACCINE  08/02/2023   Medicare Annual Wellness (AWV)  05/07/2024   DEXA SCAN  Completed   Hepatitis C Screening  Completed   HPV VACCINES  Aged Out   Meningococcal B Vaccine  Aged Out   Lung Cancer Screening  Discontinued   DTaP/Tdap/Td  Discontinued   Pneumonia Vaccine 10+ Years old  Discontinued   Colonoscopy  Discontinued   COVID-19 Vaccine  Discontinued   Zoster Vaccines- Shingrix  Discontinued    Health Maintenance  There are no preventive care reminders to display for this patient.  Health Maintenance Items Addressed: None needed  Additional Screening:  Vision Screening: Recommended annual ophthalmology exams for early detection of glaucoma and other disorders of the eye.  Dental Screening: Recommended annual dental exams for proper oral hygiene  Community Resource Referral / Chronic Care Management: CRR required this visit?  No   CCM required this visit?  No    Plan:     I have personally reviewed and noted the following in the patient's chart:   Medical and social history Use of alcohol, tobacco or illicit drugs  Current medications and supplements including opioid prescriptions. Patient is not currently taking opioid prescriptions. Functional ability and status Nutritional status Physical activity Advanced directives List of other physicians Hospitalizations, surgeries, and ER  visits in previous 12 months Vitals Screenings to include cognitive, depression, and falls Referrals and appointments  In addition, I have reviewed and discussed with patient certain preventive protocols, quality metrics, and best practice recommendations. A written personalized care plan for preventive services as well as general preventive health recommendations were provided to patient.    Nerissa Bannister, LPN   0/02/5850   After Visit Summary: (MyChart) Due to this being a telephonic visit, the after visit summary with patients personalized plan was offered to patient via MyChart   Notes: See wrap up comment

## 2023-05-08 NOTE — Progress Notes (Signed)
 Pulmonary Individual Treatment Plan  Patient Details  Name: Teresa Jensen MRN: [id number] Date of Birth: 02-Dec-1945 Referring Provider:   Gattis Kass Pulmonary Rehab from 04/24/2023 in [facility] Cardiac and Pulmonary Rehab  Referring Provider Dr. Coralie Derrick       Initial Encounter Date:  Flowsheet Row Pulmonary Rehab from 04/24/2023 in [facility] Cardiac and Pulmonary Rehab  Date 04/24/23       Visit Diagnosis: COPD  Pulmonary fibrosis (HCC)  Chronic obstructive pulmonary disease, unspecified COPD type (HCC)  Patient's Home Medications on Admission:  Current Outpatient Medications:    alendronate  (FOSAMAX ) 70 MG tablet, Take 70 mg by mouth once a week. Take with a full glass of water on an empty stomach. (Patient not taking: Reported on 04/18/2023), Disp: , Rfl:    atorvastatin  (LIPITOR) 10 MG tablet, TAKE 1 TABLET BY MOUTH DAILY. FOR CHOLESTEROL, Disp: 90 tablet, Rfl: 1   Calcium  Carb-Cholecalciferol (CALCIUM  + VITAMIN D3 PO), Take 1,200 mg by mouth daily., Disp: , Rfl:    dapagliflozin propanediol  (FARXIGA ) 10 MG TABS tablet, TAKE 1 TABLET BY MOUTH DAILY BEFORE BREAKFAST., Disp: 90 tablet, Rfl: 3   famotidine  (PEPCID ) 20 MG tablet, TAKE 1 TABLET BY MOUTH AFTER SUPPER, Disp: 90 tablet, Rfl: 3   furosemide  (LASIX ) 40 MG tablet, Take 1 tablet (40 mg total) by mouth daily., Disp: 30 tablet, Rfl: 0   Glycopyrrolate -Formoterol  (BEVESPI  AEROSPHERE) 9-4.8 MCG/ACT AERO, Inhale 2 puffs into the lungs 2 (two) times daily., Disp: 3 each, Rfl: 3   levalbuterol  (XOPENEX  HFA) 45 MCG/ACT inhaler, Inhale 2 puffs into the lungs every 4 (four) hours as needed for wheezing or shortness of breath., Disp: 1 each, Rfl: 12   levothyroxine  (SYNTHROID ) 75 MCG tablet, TAKE 1 EVERY AM ON AN EMPTY STOMACH WITH WATER ONLY. NO FOOD OR OTHER MEDICATIONS FOR 30 MINUTES., Disp: 90 tablet, Rfl: 1   losartan  (COZAAR ) 100 MG tablet, TAKE 1 TABLET (100 MG TOTAL) BY MOUTH DAILY. PLEASE CALL [phone] TO SCHEDULE A  YEARLY APPOINTMENT. THANK YOU., Disp: 90 tablet, Rfl: 3   Multiple Vitamins-Minerals (PRESERVISION AREDS 2 PO), Take 1 tablet by mouth in the morning and at bedtime., Disp: , Rfl:    nystatin  cream (MYCOSTATIN ), Apply 1 Application topically 2 (two) times daily., Disp: 30 g, Rfl: 1   pantoprazole  (PROTONIX ) 40 MG tablet, Take 1 tablet (40 mg total) by mouth daily. for heartburn., Disp: 90 tablet, Rfl: 3   pentoxifylline  (TRENTAL ) 400 MG CR tablet, Take 1 tablet (400 mg total) by mouth in the morning and at bedtime., Disp: 180 tablet, Rfl: 1   spironolactone  (ALDACTONE ) 25 MG tablet, Take 0.5 tablets (12.5 mg total) by mouth daily., Disp: 45 tablet, Rfl: 3   tacrolimus  (PROTOPIC ) 0.1 % ointment, Apply to aa's lower legs BID PRN., Disp: 60 g, Rfl: 5  Past Medical History: Past Medical History:  Diagnosis Date   (HFpEF) heart failure with preserved ejection fraction (HCC) 06/28/2011   a.) TTE 06/28/2011: EF 55-60%, mild AR, G1DD; b.) TTE 01/16/2013: EF 55-60%, mild MR; c.) TTE 02/16/2018: EF 60-65%, G1DD; d.) MPI 11/26/2018: EF 56%   Abnormal electrocardiogram 11/12/2018   Acute kidney failure (HCC) 01/12/2017   Acute on chronic respiratory failure with hypoxia (HCC) 09/06/2022   Adenoma of left adrenal gland    Anemia    Anxiety    Aortic atherosclerosis (HCC)    Bilateral carpal tunnel syndrome    CAD (coronary artery disease)    a.) HR CT chest 10/30/2021: 2 vessel  CAD (LAD/RCA)   CAP (community acquired pneumonia)    COPD (chronic obstructive pulmonary disease) (HCC)    COPD exacerbation (HCC) 01/21/2018   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   Diverticulosis    Dyspnea    DUE TO HEAT WITH COPD   Epistaxis 01/09/2017   GERD (gastroesophageal reflux disease)    Herpes zoster 07/08/2015   Hypertension    Hypothyroidism    Iatrogenic hypotension 01/12/2017   Iliac artery stenosis, bilateral (HCC)    Morbid obesity with BMI of 40.0-44.9, adult (HCC)    New onset of headaches     Osteopenia    PMR (polymyalgia rheumatica) (HCC)    Pneumonia 02/13/2018   PONV (postoperative nausea and vomiting)    Pulmonary nodule, left    a.) CT chest 10/30/2021: 1.5 cm medial LUL nodule; b.) PET CT 11/21/2021: hypermetabolic LUL nodule (SUX max 9.3)   S/P carpal tunnel release 08/04/2020   Shingles    Stasis dermatitis    Strain of chest wall 01/20/2011   Tobacco abuse    Vitamin D  deficiency     Tobacco Use: Social History   Tobacco Use  Smoking Status Former   Current packs/day: 0.00   Average packs/day: 1.5 packs/day for 20.0 years (30.0 ttl pk-yrs)   Types: Cigarettes   Start date: 02/11/1989   Quit date: 02/11/2009   Years since quitting: 14.2  Smokeless Tobacco Never    Labs: Review Flowsheet  More data exists      Latest Ref Rng & Units 10/17/2017 10/24/2018 10/27/2019 02/28/2021 02/22/2022  Labs for ITP Cardiac and Pulmonary Rehab  Cholestrol 0 - 200 mg/dL 161  096  045  409  811   LDL (calc) 0 - 99 mg/dL 84  95  82  66  59   HDL-C >39.00 mg/dL 91.47  82.95  62.13  08.65  51.80   Trlycerides 0.0 - 149.0 mg/dL 784.6  962.9  52.8  413.2  149.0   Hemoglobin A1c 4.6 - 6.5 % 6.4  6.1  6.4  5.3  5.5      Pulmonary Assessment Scores:  Pulmonary Assessment Scores     Row Name 04/24/23 1129         ADL UCSD   ADL Phase Entry     SOB Score total 55     Rest 3     Walk 3     Stairs 4     Bath 2     Dress 2     Shop 2       CAT Score   CAT Score 21       mMRC Score   mMRC Score 2              UCSD: Self-administered rating of dyspnea associated with activities of daily living (ADLs) 6-point scale (0 = "not at all" to 5 = "maximal or unable to do because of breathlessness")  Scoring Scores range from 0 to 120.  Minimally important difference is 5 units  CAT: CAT can identify the health impairment of COPD patients and is better correlated with disease progression.  CAT has a scoring range of zero to 40. The CAT score is classified into  four groups of low (less than 10), medium (10 - 20), high (21-30) and very high (31-40) based on the impact level of disease on health status. A CAT score over 10 suggests significant symptoms.  A worsening CAT score could be explained by an exacerbation, poor medication  adherence, poor inhaler technique, or progression of COPD or comorbid conditions.  CAT MCID is 2 points  mMRC: mMRC (Modified Medical Research Council) Dyspnea Scale is used to assess the degree of baseline functional disability in patients of respiratory disease due to dyspnea. No minimal important difference is established. A decrease in score of 1 point or greater is considered a positive change.   Pulmonary Function Assessment:   Exercise Target Goals: Exercise Program Goal: Individual exercise prescription set using results from initial 6 min walk test and THRR while considering  patient's activity barriers and safety.   Exercise Prescription Goal: Initial exercise prescription builds to 30-45 minutes a day of aerobic activity, 2-3 days per week.  Home exercise guidelines will be given to patient during program as part of exercise prescription that the participant will acknowledge.  Education: Aerobic Exercise: - Group verbal and visual presentation on the components of exercise prescription. Introduces F.I.T.T principle from ACSM for exercise prescriptions.  Reviews F.I.T.T. principles of aerobic exercise including progression. Written material given at graduation. Flowsheet Row Pulmonary Rehab from 04/24/2023 in [facility] Card[facility] Pulmonary Rehab  Education need identified 04/24/23       Education: Resistance Exercise: - Group verbal and visual presentation on the components of exercise prescription. Introduces F.I.T.T principle from ACSM for exercise prescriptions  Reviews F.I.T.T. principles of resistance exercise including progression. Written material given at graduation.    Education: Exercise & Equipment  Safety: - Individual verbal instruction and demonstration of equipment use and safety with use of the equipment. Flowsheet Row Pulmonary Rehab from 04/24/2023 in [facility] Cardiac and Pulmonary Rehab  Date 04/24/23  Educator NT  Instruction Review Code 1- Verbalizes Understanding       Education: Exercise Physiology & General Exercise Guidelines: - Group verbal and written instruction with models to review the exercise physiology of the cardiovascular system and associated critical values. Provides general exercise guidelines with specific guidelines to those with heart or lung disease.    Education: Flexibility, Balance, Mind/Body Relaxation: - Group verbal and visual presentation with interactive activity on the components of exercise prescription. Introduces F.I.T.T principle from ACSM for exercise prescriptions. Reviews F.I.T.T. principles of flexibility and balance exercise training including progression. Also discusses the mind body connection.  Reviews various relaxation techniques to help reduce and manage stress (i.e. Deep breathing, progressive muscle relaxation, and visualization). Balance handout provided to take home. Written material given at graduation.   Activity Barriers & Risk Stratification:  Activity Barriers & Cardiac Risk Stratification - 04/24/23 1153       Activity Barriers & Cardiac Risk Stratification   Activity Barriers Shortness of Breath;Arthritis             6 Minute Walk:  6 Minute Walk     Row Name 04/24/23 1137         6 Minute Walk   Phase Initial     Distance 615 feet     Walk Time 4.73 minutes     # of Rest Breaks 3     MPH 1.48     METS 1.18     RPE 13     Perceived Dyspnea  2     VO2 Peak 4.13     Symptoms No     Resting HR 77 bpm     Resting BP 122/64     Resting Oxygen Saturation  95 %     Exercise Oxygen Saturation  during 6 min walk 90 %     Max  Ex. HR 119 bpm     Max Ex. BP 142/66     2 Minute Post BP 120/62       Interval  HR   1 Minute HR 105     2 Minute HR 111     3 Minute HR 119     4 Minute HR 112     5 Minute HR 115     6 Minute HR 119     2 Minute Post HR 79     Interval Heart Rate? Yes       Interval Oxygen   Interval Oxygen? Yes     Baseline Oxygen Saturation % 95 %     1 Minute Oxygen Saturation % 93 %     1 Minute Liters of Oxygen 1.5 L     2 Minute Oxygen Saturation % 91 %     2 Minute Liters of Oxygen 1.5 L     3 Minute Oxygen Saturation % 90 %     3 Minute Liters of Oxygen 1.5 L     4 Minute Oxygen Saturation % 92 %     4 Minute Liters of Oxygen 1.5 L     5 Minute Oxygen Saturation % 91 %     5 Minute Liters of Oxygen 1.5 L     6 Minute Oxygen Saturation % 91 %     6 Minute Liters of Oxygen 1.5 L     2 Minute Post Oxygen Saturation % 95 %     2 Minute Post Liters of Oxygen 1.5 L             Oxygen Initial Assessment:  Oxygen Initial Assessment - 04/18/23 1441       Home Oxygen   Home Oxygen Device Home Concentrator;E-Tanks    Sleep Oxygen Prescription Continuous    Liters per minute 2    Home Exercise Oxygen Prescription Continuous    Liters per minute 1.5    Home Resting Oxygen Prescription Continuous    Liters per minute 1.5   as needed  only when sat drops below 88%   Compliance with Home Oxygen Use Yes      Intervention   Short Term Goals To learn and exhibit compliance with exercise, home and travel O2 prescription;To learn and understand importance of monitoring SPO2 with pulse oximeter and demonstrate accurate use of the pulse oximeter.;To learn and understand importance of maintaining oxygen saturations>88%;To learn and demonstrate proper pursed lip breathing techniques or other breathing techniques. ;To learn and demonstrate proper use of respiratory medications    Long  Term Goals Exhibits compliance with exercise, home  and travel O2 prescription;Verbalizes importance of monitoring SPO2 with pulse oximeter and return demonstration;Maintenance of O2  saturations>88%;Exhibits proper breathing techniques, such as pursed lip breathing or other method taught during program session;Compliance with respiratory medication;Demonstrates proper use of MDI's             Oxygen Re-Evaluation:  Oxygen Re-Evaluation     Row Name 04/30/23 0826             Program Oxygen Prescription   Program Oxygen Prescription Continuous       Liters per minute 2         Home Oxygen   Home Oxygen Device Home Concentrator;E-Tanks       Sleep Oxygen Prescription Continuous       Liters per minute 2       Home Exercise Oxygen Prescription Continuous  Liters per minute 1.5       Home Resting Oxygen Prescription Continuous       Liters per minute 1.5       Compliance with Home Oxygen Use Yes         Goals/Expected Outcomes   Short Term Goals To learn and demonstrate proper pursed lip breathing techniques or other breathing techniques.        Long  Term Goals Exhibits proper breathing techniques, such as pursed lip breathing or other method taught during program session       Comments Reviewed PLB technique with pt.  Talked about how it works and it's importance in maintaining their exercise saturations.       Goals/Expected Outcomes Short: Become more profiecient at using PLB. Long: Become independent at using PLB.                Oxygen Discharge (Final Oxygen Re-Evaluation):  Oxygen Re-Evaluation - 04/30/23 0826       Program Oxygen Prescription   Program Oxygen Prescription Continuous    Liters per minute 2      Home Oxygen   Home Oxygen Device Home Concentrator;E-Tanks    Sleep Oxygen Prescription Continuous    Liters per minute 2    Home Exercise Oxygen Prescription Continuous    Liters per minute 1.5    Home Resting Oxygen Prescription Continuous    Liters per minute 1.5    Compliance with Home Oxygen Use Yes      Goals/Expected Outcomes   Short Term Goals To learn and demonstrate proper pursed lip breathing techniques or  other breathing techniques.     Long  Term Goals Exhibits proper breathing techniques, such as pursed lip breathing or other method taught during program session    Comments Reviewed PLB technique with pt.  Talked about how it works and it's importance in maintaining their exercise saturations.    Goals/Expected Outcomes Short: Become more profiecient at using PLB. Long: Become independent at using PLB.             Initial Exercise Prescription:  Initial Exercise Prescription - 04/24/23 1100       Date of Initial Exercise RX and Referring Provider   Date 04/24/23    Referring Provider Dr. Coralie Derrick      Oxygen   Maintain Oxygen Saturation 88% or higher      Recumbant Bike   Level 1    RPM 50    Watts 15    Minutes 15    METs 1.18      NuStep   Level 1    SPM 80    Minutes 15    METs 1.18      Track   Laps 12    Minutes 15    METs 1.65      Prescription Details   Frequency (times per week) 2    Duration Progress to 30 minutes of continuous aerobic without signs/symptoms of physical distress      Intensity   THRR 40-80% of Max Heartrate 103-129    Ratings of Perceived Exertion 11-13    Perceived Dyspnea 0-4      Progression   Progression Continue to progress workloads to maintain intensity without signs/symptoms of physical distress.      Resistance Training   Training Prescription Yes    Weight 3 lb    Reps 10-15             Perform Capillary Blood  Glucose checks as needed.  Exercise Prescription Changes:   Exercise Prescription Changes     Row Name 04/24/23 1100             Response to Exercise   Blood Pressure (Admit) 122/64       Blood Pressure (Exercise) 142/66       Blood Pressure (Exit) 120/62       Heart Rate (Admit) 77 bpm       Heart Rate (Exercise) 119 bpm       Heart Rate (Exit) 79 bpm       Oxygen Saturation (Admit) 95 %       Oxygen Saturation (Exercise) 90 %       Oxygen Saturation (Exit) 95 %       Rating of  Perceived Exertion (Exercise) 13       Perceived Dyspnea (Exercise) 2       Symptoms none       Comments Results                Exercise Comments:   Exercise Comments     Row Name 04/30/23 0825           Exercise Comments First full day of exercise!  Patient was oriented to gym and equipment including functions, settings, policies, and procedures.  Patient's individual exercise prescription and treatment plan were reviewed.  All starting workloads were established based on the results of the 6 minute walk test done at initial orientation visit.  The plan for exercise progression was also introduced and progression will be customized based on patient's performance and goals.                Exercise Goals and Review:   Exercise Goals     Row Name 04/24/23 1153             Exercise Goals   Increase Physical Activity Yes       Intervention Provide advice, education, support and counseling about physical activity/exercise needs.;Develop an individualized exercise prescription for aerobic and resistive training based on initial evaluation findings, risk stratification, comorbidities and participant's personal goals.       Expected Outcomes Short Term: Attend rehab on a regular basis to increase amount of physical activity.;Long Term: Add in home exercise to make exercise part of routine and to increase amount of physical activity.;Long Term: Exercising regularly at least 3-5 days a week.       Increase Strength and Stamina Yes       Intervention Provide advice, education, support and counseling about physical activity/exercise needs.;Develop an individualized exercise prescription for aerobic and resistive training based on initial evaluation findings, risk stratification, comorbidities and participant's personal goals.       Expected Outcomes Short Term: Increase workloads from initial exercise prescription for resistance, speed, and METs.;Long Term: Improve  cardiorespiratory fitness, muscular endurance and strength as measured by increased METs and functional capacity ( );Short Term: Perform resistance training exercises routinely during rehab and add in resistance training at home       Able to understand and use rate of perceived exertion (RPE) scale Yes       Intervention Provide education and explanation on how to use RPE scale       Expected Outcomes Short Term: Able to use RPE daily in rehab to express subjective intensity level;Long Term:  Able to use RPE to guide intensity level when exercising independently       Able to understand and  use Dyspnea scale Yes       Intervention Provide education and explanation on how to use Dyspnea scale       Expected Outcomes Short Term: Able to use Dyspnea scale daily in rehab to express subjective sense of shortness of breath during exertion;Long Term: Able to use Dyspnea scale to guide intensity level when exercising independently       Knowledge and understanding of Target Heart Rate Range (THRR) Yes       Intervention Provide education and explanation of THRR including how the numbers were predicted and where they are located for reference       Expected Outcomes Short Term: Able to state/look up THRR;Long Term: Able to use THRR to govern intensity when exercising independently;Short Term: Able to use daily as guideline for intensity in rehab       Able to check pulse independently Yes       Intervention Provide education and demonstration on how to check pulse in carotid and radial arteries.;Review the importance of being able to check your own pulse for safety during independent exercise       Expected Outcomes Short Term: Able to explain why pulse checking is important during independent exercise;Long Term: Able to check pulse independently and accurately       Understanding of Exercise Prescription Yes       Intervention Provide education, explanation, and written materials on patient's individual  exercise prescription       Expected Outcomes Short Term: Able to explain program exercise prescription;Long Term: Able to explain home exercise prescription to exercise independently                Exercise Goals Re-Evaluation :  Exercise Goals Re-Evaluation     Row Name 04/30/23 [phone]             Exercise Goal Re-Evaluation   Exercise Goals Review Able to understand and use rate of perceived exertion (RPE) scale;Able to understand and use Dyspnea scale;Knowledge and understanding of Target Heart Rate Range (THRR);Understanding of Exercise Prescription       Comments Reviewed RPE and dyspnea scale, THR and program prescription with pt today.  Pt voiced understanding and was given a copy of goals to take home.       Expected Outcomes Short: Use RPE daily to regulate intensity. Long: Follow program prescription in THR.                Discharge Exercise Prescription (Final Exercise Prescription Changes):  Exercise Prescription Changes - 04/24/23 1100       Response to Exercise   Blood Pressure (Admit) 122/64    Blood Pressure (Exercise) 142/66    Blood Pressure (Exit) 120/62    Heart Rate (Admit) 77 bpm    Heart Rate (Exercise) 119 bpm    Heart Rate (Exit) 79 bpm    Oxygen Saturation (Admit) 95 %    Oxygen Saturation (Exercise) 90 %    Oxygen Saturation (Exit) 95 %    Rating of Perceived Exertion (Exercise) 13    Perceived Dyspnea (Exercise) 2    Symptoms none    Comments Results             Nutrition:  Target Goals: Understanding of nutrition guidelines, daily intake of sodium 1500mg , cholesterol 200mg , calories 30% from fat and 7% or less from saturated fats, daily to have 5 or more servings of fruits and vegetables.  Education: All About Nutrition: -Group instruction provided by verbal, written  material, interactive activities, discussions, models, and posters to present general guidelines for heart healthy nutrition including fat, fiber, MyPlate, the  role of sodium in heart healthy nutrition, utilization of the nutrition label, and utilization of this knowledge for meal planning. Follow up email sent as well. Written material given at graduation.   Biometrics:  Pre Biometrics - 04/24/23 1154       Pre Biometrics   Height 4\' 11"  (1.499 m)    Weight 158 lb 11.2 oz (72 kg)    Waist Circumference 40 inches    Hip Circumference 42 inches    Waist to Hip Ratio 0.95 %    BMI (Calculated) 32.04    Single Leg Stand 6.78 seconds              Nutrition Therapy Plan and Nutrition Goals:  Nutrition Therapy & Goals - 04/24/23 1338       Nutrition Therapy   Diet Cardiac, low Na    Protein (specify units) 70-90    Fiber 25 grams    Whole Grain Foods 3 servings    Saturated Fats 15 max. grams    Fruits and Vegetables 5 servings/day    Sodium 2 grams      Personal Nutrition Goals   Nutrition Goal Eat 15-30gProtein and 30-60gCarbs at each meal.    Personal Goal #2 Read labels and reduce sodium intake to below 2300mg . Ideally 1500mg  per day.    Personal Goal #3 Drink 48-64oz of water daily    Comments Patient drinking ~48oz of water. Sometimes will have diet pepsi. She usually eats 3 meals per day. Lunch being her largest meal. She says she usually has veggies at least 1 time per day and likes to snack on fruit. Reviewed mediterranean diet handout. Educated on types of fats, sources, and how to read labels. She watches her sodium intake and says most days she feels she eats well, but sometimes she wants a hotdog. Encouraged and supported making change that are sustainable and allows for some indulgences.      Intervention Plan   Intervention Prescribe, educate and counsel regarding individualized specific dietary modifications aiming towards targeted core components such as weight, hypertension, lipid management, diabetes, heart failure and other comorbidities.;Nutrition handout(s) given to patient.    Expected Outcomes Short Term Goal:  Understand basic principles of dietary content, such as calories, fat, sodium, cholesterol and nutrients.;Short Term Goal: A plan has been developed with personal nutrition goals set during dietitian appointment.;Long Term Goal: Adherence to prescribed nutrition plan.             Nutrition Assessments:  MEDIFICTS Score Key: >=70 Need to make dietary changes  40-70 Heart Healthy Diet <= 40 Therapeutic Level Cholesterol Diet  Flowsheet Row Pulmonary Rehab from 04/24/2023 in [facility] Cardiac and Pulmonary Rehab  Picture Your Plate Total Score on Admission 77      Picture Your Plate Scores: <16 Unhealthy dietary pattern with much room for improvement. 41-50 Dietary pattern unlikely to meet recommendations for good health and room for improvement. 51-60 More healthful dietary pattern, with some room for improvement.  >60 Healthy dietary pattern, although there may be some specific behaviors that could be improved.   Nutrition Goals Re-Evaluation:   Nutrition Goals Discharge (Final Nutrition Goals Re-Evaluation):   Psychosocial: Target Goals: Acknowledge presence or absence of significant depression and/or stress, maximize coping skills, provide positive support system. Participant is able to verbalize types and ability to use techniques and skills needed for reducing stress  and depression.   Education: Stress, Anxiety, and Depression - Group verbal and visual presentation to define topics covered.  Reviews how body is impacted by stress, anxiety, and depression.  Also discusses healthy ways to reduce stress and to treat/manage anxiety and depression.  Written material given at graduation.   Education: Sleep Hygiene -Provides group verbal and written instruction about how sleep can affect your health.  Define sleep hygiene, discuss sleep cycles and impact of sleep habits. Review good sleep hygiene tips.    Initial Review & Psychosocial Screening:  Initial Psych Review & Screening -  04/18/23 1442       Initial Review   Current issues with None Identified      Family Dynamics   Good Support System? Yes   husband  56 yrs,     Barriers   Psychosocial barriers to participate in program There are no identifiable barriers or psychosocial needs.      Screening Interventions   Interventions To provide support and resources with identified psychosocial needs;Provide feedback about the scores to participant;Encouraged to exercise    Expected Outcomes Short Term goal: Utilizing psychosocial counselor, staff and physician to assist with identification of specific Stressors or current issues interfering with healing process. Setting desired goal for each stressor or current issue identified.;Long Term Goal: Stressors or current issues are controlled or eliminated.;Short Term goal: Identification and review with participant of any Quality of Life or Depression concerns found by scoring the questionnaire.;Long Term goal: The participant improves quality of Life and PHQ9 Scores as seen by post scores and/or verbalization of changes             Quality of Life Scores:  Scores of 19 and below usually indicate a poorer quality of life in these areas.  A difference of  2-3 points is a clinically meaningful difference.  A difference of 2-3 points in the total score of the Quality of Life Index has been associated with significant improvement in overall quality of life, self-image, physical symptoms, and general health in studies assessing change in quality of life.  PHQ-9: Review Flowsheet  More data exists      04/24/2023 01/22/2023 11/21/2022 07/17/2022 07/04/2022  Depression screen PHQ 2/9  Decreased Interest 0 0 0 0 0 0  Down, Depressed, Hopeless 2 0 0 0 0 0  PHQ - 2 Score 2 0 0 0 0 0  Altered sleeping 2 0 - 0 0  Tired, decreased energy 1 0 - 0 0  Change in appetite 2 0 - 0 0  Feeling bad or failure about yourself  0 0 - 0 0  Trouble concentrating 0 0 - 0 0  Moving slowly or  fidgety/restless 0 0 - 0 0  Suicidal thoughts 0 0 - 0 0  PHQ-9 Score 7 0 - 0 0  Difficult doing work/chores Somewhat difficult Not difficult at all - Not difficult at all Not difficult at all    Details       Multiple values from one day are sorted in reverse-chronological order        Interpretation of Total Score  Total Score Depression Severity:  1-4 = Minimal depression, 5-9 = Mild depression, 10-14 = Moderate depression, 15-19 = Moderately severe depression, 20-27 = Severe depression   Psychosocial Evaluation and Intervention:  Psychosocial Evaluation - 04/18/23 1500       Psychosocial Evaluation & Interventions   Interventions Encouraged to exercise with the program and follow exercise prescription  Comments Thre are no barriers to attending the program.  She lives with her husband (56yrs) and he is her support. She is ready to start the program    Expected Outcomes STG attend all scheduled sessions, work on exercise progression as guided by the exercise physiologistLTG maintian exercise prgogression after discharge, looking for decreased shaortness of breath    Continue Psychosocial Services  Follow up required by staff             Psychosocial Re-Evaluation:   Psychosocial Discharge (Final Psychosocial Re-Evaluation):   Education: Education Goals: Education classes will be provided on a weekly basis, covering required topics. Participant will state understanding/return demonstration of topics presented.  Learning Barriers/Preferences:   General Pulmonary Education Topics:  Infection Prevention: - Provides verbal and written material to individual with discussion of infection control including proper hand washing and proper equipment cleaning during exercise session. Flowsheet Row Pulmonary Rehab from 04/24/2023 in [facility] Cardiac and Pulmonary Rehab  Date 04/24/23  Educator NT  Instruction Review Code 1- Verbalizes Understanding       Falls  Prevention: - Provides verbal and written material to individual with discussion of falls prevention and safety. Flowsheet Row Pulmonary Rehab from 04/24/2023 in [facility] Cardiac and Pulmonary Rehab  Date 04/18/23  Educator SB  Instruction Review Code 1- Verbalizes Understanding       Chronic Lung Disease Review: - Group verbal instruction with posters, models, PowerPoint presentations and videos,  to review new updates, new respiratory medications, new advancements in procedures and treatments. Providing information on websites and "800" numbers for continued self-education. Includes information about supplement oxygen, available portable oxygen systems, continuous and intermittent flow rates, oxygen safety, concentrators, and Medicare reimbursement for oxygen. Explanation of Pulmonary Drugs, including class, frequency, complications, importance of spacers, rinsing mouth after steroid MDI's, and proper cleaning methods for nebulizers. Review of basic lung anatomy and physiology related to function, structure, and complications of lung disease. Review of risk factors. Discussion about methods for diagnosing sleep apnea and types of masks and machines for OSA. Includes a review of the use of types of environmental controls: home humidity, furnaces, filters, dust mite/pet prevention, HEPA vacuums. Discussion about weather changes, air quality and the benefits of nasal washing. Instruction on Warning signs, infection symptoms, calling MD promptly, preventive modes, and value of vaccinations. Review of effective airway clearance, coughing and/or vibration techniques. Emphasizing that all should Create an Action Plan. Written material given at graduation. Flowsheet Row Pulmonary Rehab from 04/24/2023 in [facility] Cardiac and Pulmonary Rehab  Education need identified 04/24/23       AED/CPR: - Group verbal and written instruction with the use of models to demonstrate the basic use of the AED with the basic ABC's  of resuscitation.    Anatomy and Cardiac Procedures: - Group verbal and visual presentation and models provide information about basic cardiac anatomy and function. Reviews the testing methods done to diagnose heart disease and the outcomes of the test results. Describes the treatment choices: Medical Management, Angioplasty, or Coronary Bypass Surgery for treating various heart conditions including Myocardial Infarction, Angina, Valve Disease, and Cardiac Arrhythmias.  Written material given at graduation.   Medication Safety: - Group verbal and visual instruction to review commonly prescribed medications for heart and lung disease. Reviews the medication, class of the drug, and side effects. Includes the steps to properly store meds and maintain the prescription regimen.  Written material given at graduation.   Other: -Provides group and verbal instruction on various topics (see  comments)   Knowledge Questionnaire Score:  Knowledge Questionnaire Score - 04/24/23 1126       Knowledge Questionnaire Score   Pre Score 15/18              Core Components/Risk Factors/Patient Goals at Admission:  Personal Goals and Risk Factors at Admission - 04/18/23 1451       Core Components/Risk Factors/Patient Goals on Admission    Weight Management Yes    Intervention Weight Management: Develop a combined nutrition and exercise program designed to reach desired caloric intake, while maintaining appropriate intake of nutrient and fiber, sodium and fats, and appropriate energy expenditure required for the weight goal.;Weight Management: Provide education and appropriate resources to help participant work on and attain dietary goals.    Admit Weight 160 lb (72.6 kg)    Goal Weight: Short Term 159 lb (72.1 kg)    Goal Weight: Long Term 130 lb (59 kg)    Expected Outcomes Short Term: Continue to assess and modify interventions until short term weight is achieved;Long Term: Adherence to nutrition  and physical activity/exercise program aimed toward attainment of established weight goal;Weight Loss: Understanding of general recommendations for a balanced deficit meal plan, which promotes 1-2 lb weight loss per week and includes a negative energy balance of [phone] kcal/d    Improve shortness of breath with ADL's Yes    Intervention Provide education, individualized exercise plan and daily activity instruction to help decrease symptoms of SOB with activities of daily living.    Expected Outcomes Short Term: Improve cardiorespiratory fitness to achieve a reduction of symptoms when performing ADLs;Long Term: Be able to perform more ADLs without symptoms or delay the onset of symptoms    Increase knowledge of respiratory medications and ability to use respiratory devices properly  Yes    Intervention Provide education and demonstration as needed of appropriate use of medications, inhalers, and oxygen therapy.    Expected Outcomes Short Term: Achieves understanding of medications use. Understands that oxygen is a medication prescribed by physician. Demonstrates appropriate use of inhaler and oxygen therapy.;Long Term: Maintain appropriate use of medications, inhalers, and oxygen therapy.    Hypertension Yes    Intervention Provide education on lifestyle modifcations including regular physical activity/exercise, weight management, moderate sodium restriction and increased consumption of fresh fruit, vegetables, and low fat dairy, alcohol moderation, and smoking cessation.;Monitor prescription use compliance.    Expected Outcomes Short Term: Continued assessment and intervention until BP is < 140/9mm HG in hypertensive participants. < 130/22mm HG in hypertensive participants with diabetes, heart failure or chronic kidney disease.;Long Term: Maintenance of blood pressure at goal levels.    Lipids Yes    Intervention Provide education and support for participant on nutrition & aerobic/resistive exercise  along with prescribed medications to achieve LDL 70mg , HDL >40mg .    Expected Outcomes Short Term: Participant states understanding of desired cholesterol values and is compliant with medications prescribed. Participant is following exercise prescription and nutrition guidelines.;Long Term: Cholesterol controlled with medications as prescribed, with individualized exercise RX and with personalized nutrition plan. Value goals: LDL < 70mg , HDL > 40 mg.             Education:Diabetes - Individual verbal and written instruction to review signs/symptoms of diabetes, desired ranges of glucose level fasting, after meals and with exercise. Acknowledge that pre and post exercise glucose checks will be done for 3 sessions at entry of program.   Know Your Numbers and Heart Failure: - Group verbal and visual  instruction to discuss disease risk factors for cardiac and pulmonary disease and treatment options.  Reviews associated critical values for Overweight/Obesity, Hypertension, Cholesterol, and Diabetes.  Discusses basics of heart failure: signs/symptoms and treatments.  Introduces Heart Failure Zone chart for action plan for heart failure.  Written material given at graduation.   Core Components/Risk Factors/Patient Goals Review:    Core Components/Risk Factors/Patient Goals at Discharge (Final Review):    ITP Comments:  ITP Comments     Row Name 04/18/23 1459 04/24/23 1124 04/30/23 0825 05/08/23 0850     ITP Comments Virtual orientation call completed today. shehas an appointment on Date: 04/24/2023  for EP eval and gym Orientation.  Documentation of diagnosis can be found in [facility] 03/08/2023 . Completed and gym orientation for respiratory care services. Initial ITP created and sent for review to Dr. Faud Aleskerov, Medical Director. First full day of exercise!  Patient was oriented to gym and equipment including functions, settings, policies, and procedures.  Patient's individual exercise  prescription and treatment plan were reviewed.  All starting workloads were established based on the results of the 6 minute walk test done at initial orientation visit.  The plan for exercise progression was also introduced and progression will be customized based on patient's performance and goals. 30 Day review completed. Medical Director ITP review done, changes made as directed, and signed approval by Medical Director. New to prgroam             Comments: 30 day review

## 2023-05-09 ENCOUNTER — Encounter

## 2023-05-14 ENCOUNTER — Encounter: Admitting: *Deleted

## 2023-05-14 DIAGNOSIS — J4489 Other specified chronic obstructive pulmonary disease: Secondary | ICD-10-CM | POA: Diagnosis not present

## 2023-05-14 NOTE — Progress Notes (Signed)
 Daily Session Note  Patient Details  Name: DEVIAN CRIGLER MRN: [id number] Date of Birth: 1945-08-26 Referring Provider:   Gattis Kass Pulmonary Rehab from 04/24/2023 in [facility] Cardiac and Pulmonary Rehab  Referring Provider Dr. Coralie Derrick       Encounter Date: 05/14/2023  Check In:  Session Check In - 05/14/23 0745       Check-In   Supervising physician immediately available to respond to emergencies See telemetry face sheet for immediately available ER MD    Location [facility]-Cardiac & Pulmonary Rehab    Staff Present Maud Sorenson, RN, BSN, CCRP;Margaret Best, MS, Exercise Physiologist;Jason Martina Sledge RDN,LDN    Virtual Visit No    Medication changes reported     No    Fall or balance concerns reported    No    Warm-up and Cool-down Performed on first and last piece of equipment    Resistance Training Performed Yes    VAD Patient? No    PAD/SET Patient? No      Pain Assessment   Currently in Pain? No/denies                Social History   Tobacco Use  Smoking Status Former   Current packs/day: 0.00   Average packs/day: 1.5 packs/day for 20.0 years (30.0 ttl pk-yrs)   Types: Cigarettes   Start date: 02/11/1989   Quit date: 02/11/2009   Years since quitting: 14.2  Smokeless Tobacco Never    Goals Met:  Proper associated with RPD/PD & O2 Sat Independence with exercise equipment Exercise tolerated well No report of concerns or symptoms today  Goals Unmet:  Not Applicable  Comments: Pt able to follow exercise prescription today without complaint.  Will continue to monitor for progression.    Dr. Firman Hughes is Medical Director for [facility] Cardiac Rehabilitation.  Dr. Fuad Aleskerov is Medical Director for [facility] Pulmonary Rehabilitation.

## 2023-05-16 ENCOUNTER — Encounter: Admitting: *Deleted

## 2023-05-16 DIAGNOSIS — J4489 Other specified chronic obstructive pulmonary disease: Secondary | ICD-10-CM | POA: Diagnosis not present

## 2023-05-16 NOTE — Progress Notes (Signed)
 Daily Session Note  Patient Details  Name: VANIAH RIOFRIO MRN: [id number] Date of Birth: 01/24/1945 Referring Provider:   Gattis Kass Pulmonary Rehab from 04/24/2023 in [facility] Cardiac and Pulmonary Rehab  Referring Provider Dr. Coralie Derrick       Encounter Date: 05/16/2023  Check In:  Session Check In - 05/16/23 0747       Check-In   Supervising physician immediately available to respond to emergencies See telemetry face sheet for immediately available ER MD    Location [facility]-Cardiac & Pulmonary Rehab    Staff Present Maud Sorenson, RN, BSN, CCRP;Joseph Hood RCP,RRT,BSRT;Margaret Best, MS, Exercise Physiologist    Virtual Visit No    Medication changes reported     No    Fall or balance concerns reported    No    Warm-up and Cool-down Performed on first and last piece of equipment    Resistance Training Performed Yes    VAD Patient? No    PAD/SET Patient? No      Pain Assessment   Currently in Pain? No/denies                Social History   Tobacco Use  Smoking Status Former   Current packs/day: 0.00   Average packs/day: 1.5 packs/day for 20.0 years (30.0 ttl pk-yrs)   Types: Cigarettes   Start date: 02/11/1989   Quit date: 02/11/2009   Years since quitting: 14.2  Smokeless Tobacco Never    Goals Met:  Proper associated with RPD/PD & O2 Sat Independence with exercise equipment Exercise tolerated well No report of concerns or symptoms today  Goals Unmet:  Not Applicable  Comments: Pt able to follow exercise prescription today without complaint.  Will continue to monitor for progression.    Dr. Firman Hughes is Medical Director for [facility] Cardiac Rehabilitation.  Dr. Fuad Aleskerov is Medical Director for [facility] Pulmonary Rehabilitation.

## 2023-05-21 ENCOUNTER — Encounter: Admitting: *Deleted

## 2023-05-21 DIAGNOSIS — J8410 Pulmonary fibrosis, unspecified: Secondary | ICD-10-CM

## 2023-05-21 DIAGNOSIS — J4489 Other specified chronic obstructive pulmonary disease: Secondary | ICD-10-CM | POA: Diagnosis not present

## 2023-05-21 NOTE — Progress Notes (Signed)
 Daily Session Note  Patient Details  Name: Teresa Jensen MRN: [id number] Date of Birth: 09/11/1945 Referring Provider:   Gattis Kass Pulmonary Rehab from 04/24/2023 in [facility] Cardiac and Pulmonary Rehab  Referring Provider Dr. Coralie Derrick       Encounter Date: 05/21/2023  Check In:  Session Check In - 05/21/23 0748       Check-In   Supervising physician immediately available to respond to emergencies See telemetry face sheet for immediately available ER MD    Location [facility]-Cardiac & Pulmonary Rehab    Staff Present Maud Sorenson, RN, BSN, CCRP;Margaret Best, MS, Exercise Physiologist;Noah Tickle, BS, Exercise Physiologist;Jason Martina Sledge RDN,LDN    Virtual Visit No    Medication changes reported     No    Fall or balance concerns reported    No    Warm-up and Cool-down Performed on first and last piece of equipment    Resistance Training Performed Yes    VAD Patient? No    PAD/SET Patient? No      Pain Assessment   Currently in Pain? No/denies                Social History   Tobacco Use  Smoking Status Former   Current packs/day: 0.00   Average packs/day: 1.5 packs/day for 20.0 years (30.0 ttl pk-yrs)   Types: Cigarettes   Start date: 02/11/1989   Quit date: 02/11/2009   Years since quitting: 14.2  Smokeless Tobacco Never    Goals Met:  Proper associated with RPD/PD & O2 Sat Independence with exercise equipment Exercise tolerated well No report of concerns or symptoms today  Goals Unmet:  Not Applicable  Comments: Pt able to follow exercise prescription today without complaint.  Will continue to monitor for progression.    Dr. Firman Hughes is Medical Director for [facility] Cardiac Rehabilitation.  Dr. Fuad Aleskerov is Medical Director for [facility] Pulmonary Rehabilitation.

## 2023-05-23 ENCOUNTER — Encounter: Admitting: *Deleted

## 2023-05-23 ENCOUNTER — Telehealth: Payer: Self-pay | Admitting: Family

## 2023-05-23 DIAGNOSIS — J8410 Pulmonary fibrosis, unspecified: Secondary | ICD-10-CM

## 2023-05-23 DIAGNOSIS — J4489 Other specified chronic obstructive pulmonary disease: Secondary | ICD-10-CM | POA: Diagnosis not present

## 2023-05-23 NOTE — Telephone Encounter (Signed)
 Called to confirm/remind patient of their appointment [facility] on 05/24/23.   Appointment:   [] Confirmed  [x] Left mess   [] No answer/No voice mail  [] VM Full/unable to leave message  [] Phone not in service  Patient reminded to bring all medications and/or complete list.  Confirmed patient has transportation. Gave directions, instructed to utilize valet parking.

## 2023-05-23 NOTE — Progress Notes (Signed)
 Daily Session Note  Patient Details  Name: Teresa Jensen MRN: [id number] Date of Birth: [birth year] Referring Provider:   Gattis Kass Pulmonary Rehab from 04/24/2023 in [facility] Cardiac and Pulmonary Rehab  Referring Provider Dr. Coralie Derrick       Encounter Date: 05/23/2023  Check In:  Session Check In - 05/23/23 0751       Check-In   Supervising physician immediately available to respond to emergencies See telemetry face sheet for immediately available ER MD    Location [facility]-Cardiac & Pulmonary Rehab    Staff Present Maud Sorenson, RN, BSN, CCRP;Margaret Best, MS, Exercise Physiologist;Joseph Lacinda Pica RCP,RRT,BSRT    Virtual Visit No    Medication changes reported     No    Fall or balance concerns reported    No    Warm-up and Cool-down Performed on first and last piece of equipment    Resistance Training Performed Yes    VAD Patient? No    PAD/SET Patient? No      Pain Assessment   Currently in Pain? No/denies                Social History   Tobacco Use  Smoking Status Former   Current packs/day: 0.00   Average packs/day: 1.5 packs/day for 20.0 years (30.0 ttl pk-yrs)   Types: Cigarettes   Start date: 02/11/1989   Quit date: 02/11/2009   Years since quitting: 14.2  Smokeless Tobacco Never    Goals Met:  Proper associated with RPD/PD & O2 Sat Independence with exercise equipment Exercise tolerated well No report of concerns or symptoms today  Goals Unmet:  Not Applicable  Comments: Pt able to follow exercise prescription today without complaint.  Will continue to monitor for progression.    Dr. Firman Hughes is Medical Director for [facility] Cardiac Rehabilitation.  Dr. Fuad Aleskerov is Medical Director for [facility] Pulmonary Rehabilitation.

## 2023-05-23 NOTE — Progress Notes (Signed)
 Advanced [facility] Failure Clinic Note   PCP: Gabriel John, NP (last seen 03/25) Cardiologist: Sammy Crisp, MD/ Toribio Frees, PA (last seen 10/24)  Chief Complaint: shortness of breath   HPI:  Teresa Jensen is a 78 y/o female with a history of CAD, COPD, lung cancer, HTN, adenoma of left adrenal gland, lung cancer, anemia, anxiety, diverticulosis, GERD, hypothyroidism, bilateral iliac artery stenosis, left pulmonary nodule, polymyalgia rheumatica, tobacco use and chronic heart failure.   Echo 06/28/11: EF 55-60% with Grade I DD and mild AR Echo 01/15/13: EF 55-60% with mild AR Echo 02/16/18: EF 60-65%   Admitted 09/06/22 due to bilateral leg edema, weight gain, shortness of breath with exertion, orthopnea. Chest x-ray showed vascular congestion, patient has mild elevation of BNP.  Patient also had worsening hypoxemia on 3 L oxygen, was placed on 6 L oxygen. Given IV lasix  with improvement and oxygen weaned back to baseline 3L. Echocardiogram showed ejection fraction 55 to 60% indeterminate diastolic dysfunction. Mild pulmonary artery hypertension. Hypokalemia corrected. Venous ultrasound was negative for DVT.   She presents today for a HF f/u visit with a chief complaint of moderate shortness of breath. Wearing oxygen at 1L. Has occasional pedal edema, fatigue and soreness in right lateral thigh. Goes to pulmonary rehab twice weekly for 45 minute sessions. Denies chest pain, palpitations, abdominal distention or dizziness.   Taking furosemide  PRN for swelling etc. Estimates that she takes it ~ monthly. She did take it yesterday because she had eaten a sandwich from [employer] in [place] and noticed pedal edema the following day.   ROS: All systems negative except as listed in HPI, PMH and Problem List.  SH:  Social History   Socioeconomic History   Marital status: Married    Spouse name: Teresa Jensen   Number of children: 1   Years of education: Not on file   Highest education level: 12th grade   Occupational History   Occupation: Catering manager as a Paramedic: RETIRED    Comment: retired  Tobacco Use   Smoking status: Former    Current packs/day: 0.00    Average packs/day: 1.5 packs/day for 20.0 years (30.0 ttl pk-yrs)    Types: Cigarettes    Start date: 02/11/1989    Quit date: 02/11/2009    Years since quitting: 14.2   Smokeless tobacco: Never  Vaping Use   Vaping status: Never Used  Substance and Sexual Activity   Alcohol use: No    Alcohol/week: 0.0 standard drinks of alcohol   Drug use: No   Sexual activity: Not Currently  Other Topics Concern   Not on file  Social History Narrative   Lives in [place] with her husband.  Takes care of her two grandchildren, ages 60 and 2, everyday in [place].  Has one son who is 58.   Desires CPR   Would not want prolonged life support if futile   Social Drivers of Health   Financial Resource Strain: Patient Declined (05/01/2023)   Overall Financial Resource Strain (CARDIA)    Difficulty of Paying Living Expenses: Patient declined  Food Insecurity: Patient Declined (05/01/2023)   Hunger Vital Sign    Worried About Running Out of Food in the Last Year: Patient declined    Ran Out of Food in the Last Year: Patient declined  Transportation Needs: Patient Declined (05/01/2023)   PRAPARE - Administrator, Civil Service (Medical): Patient declined    Lack of Transportation (Non-Medical): Patient declined  Physical Activity: Sufficiently Active (05/01/2023)   Exercise Vital Sign    Days of Exercise per Week: 3 days    Minutes of Exercise per Session: 60 min  Stress: No Stress Concern Present (05/01/2023)   [employer] of Occupational Health - Occupational Stress Questionnaire    Feeling of Stress : Only a little  Social Connections: Unknown (05/01/2023)   Social Connection and Isolation Panel [NHANES]    Frequency of Communication with Friends and Family: More than three times a week    Frequency  of Social Gatherings with Friends and Family: More than three times a week    Attends Religious Services: Patient declined    Active Member of Clubs or Organizations: Patient declined    Attends Banker Meetings: Not on file    Marital Status: Married  Intimate Partner Violence: Not At Risk (05/08/2023)   Humiliation, Afraid, Rape, and Kick questionnaire    Fear of Current or Ex-Partner: No    Emotionally Abused: No    Physically Abused: No    Sexually Abused: No    FH:  Family History  Problem Relation Age of Onset   Heart attack Mother 71   Cancer Father        lung/chest wall    Prostate cancer Father    Diabetes Brother        siblings   Hypertension Sister        x 2   Heart disease Brother    Breast cancer Paternal Grandmother    Heart disease Sister    Diabetes Sister    Tuberculosis Sister    Cirrhosis Brother     Past Medical History:  Diagnosis Date   (HFpEF) heart failure with preserved ejection fraction (HCC) 06/28/2011   a.) TTE 06/28/2011: EF 55-60%, mild AR, G1DD; b.) TTE 01/16/2013: EF 55-60%, mild MR; c.) TTE 02/16/2018: EF 60-65%, G1DD; d.) MPI 11/26/2018: EF 56%   Abnormal electrocardiogram 11/12/2018   Acute kidney failure (HCC) 01/12/2017   Acute on chronic respiratory failure with hypoxia (HCC) 09/06/2022   Adenoma of left adrenal gland    Anemia    Anxiety    Aortic atherosclerosis (HCC)    Bilateral carpal tunnel syndrome    CAD (coronary artery disease)    a.) HR CT chest 10/30/2021: 2 vessel CAD (LAD/RCA)   CAP (community acquired pneumonia)    COPD (chronic obstructive pulmonary disease) (HCC)    COPD exacerbation (HCC) 01/21/2018   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   Diverticulosis    Dyspnea    DUE TO HEAT WITH COPD   Epistaxis 01/09/2017   GERD (gastroesophageal reflux disease)    Herpes zoster 07/08/2015   Hypertension    Hypothyroidism    Iatrogenic hypotension 01/12/2017   Iliac artery stenosis, bilateral  (HCC)    Morbid obesity with BMI of 40.0-44.9, adult (HCC)    New onset of headaches    Osteopenia    PMR (polymyalgia rheumatica) (HCC)    Pneumonia 02/13/2018   PONV (postoperative nausea and vomiting)    Pulmonary nodule, left    a.) CT chest 10/30/2021: 1.5 cm medial LUL nodule; b.) PET CT 11/21/2021: hypermetabolic LUL nodule (SUX max 9.3)   S/P carpal tunnel release 08/04/2020   Shingles    Stasis dermatitis    Strain of chest wall 01/20/2011   Tobacco abuse    Vitamin D  deficiency     Current Outpatient Medications  Medication Sig Dispense Refill   alendronate  (FOSAMAX ) 70  MG tablet Take 70 mg by mouth once a week. Take with a full glass of water on an empty stomach. (Patient not taking: Reported on 05/08/2023)     atorvastatin  (LIPITOR) 10 MG tablet TAKE 1 TABLET BY MOUTH DAILY. FOR CHOLESTEROL 90 tablet 1   Calcium  Carb-Cholecalciferol (CALCIUM  + VITAMIN D3 PO) Take 1,200 mg by mouth daily.     dapagliflozin propanediol  (FARXIGA ) 10 MG TABS tablet TAKE 1 TABLET BY MOUTH DAILY BEFORE BREAKFAST. 90 tablet 3   famotidine  (PEPCID ) 20 MG tablet TAKE 1 TABLET BY MOUTH AFTER SUPPER 90 tablet 3   furosemide  (LASIX ) 40 MG tablet Take 1 tablet (40 mg total) by mouth daily. 30 tablet 0   Glycopyrrolate -Formoterol  (BEVESPI  AEROSPHERE) 9-4.8 MCG/ACT AERO Inhale 2 puffs into the lungs 2 (two) times daily. 3 each 3   levalbuterol  (XOPENEX  HFA) 45 MCG/ACT inhaler Inhale 2 puffs into the lungs every 4 (four) hours as needed for wheezing or shortness of breath. 1 each 12   levothyroxine  (SYNTHROID ) 75 MCG tablet TAKE 1 EVERY AM ON AN EMPTY STOMACH WITH WATER ONLY. NO FOOD OR OTHER MEDICATIONS FOR 30 MINUTES. 90 tablet 1   losartan  (COZAAR ) 100 MG tablet TAKE 1 TABLET (100 MG TOTAL) BY MOUTH DAILY. PLEASE CALL [phone] TO SCHEDULE A YEARLY APPOINTMENT. THANK YOU. 90 tablet 3   Multiple Vitamins-Minerals (PRESERVISION AREDS 2 PO) Take 1 tablet by mouth in the morning and at bedtime.     nystatin   cream (MYCOSTATIN ) Apply 1 Application topically 2 (two) times daily. 30 g 1   pantoprazole  (PROTONIX ) 40 MG tablet Take 1 tablet (40 mg total) by mouth daily. for heartburn. 90 tablet 3   pentoxifylline  (TRENTAL ) 400 MG CR tablet Take 1 tablet (400 mg total) by mouth in the morning and at bedtime. 180 tablet 1   spironolactone  (ALDACTONE ) 25 MG tablet Take 0.5 tablets (12.5 mg total) by mouth daily. 45 tablet 3   tacrolimus  (PROTOPIC ) 0.1 % ointment Apply to aa's lower legs BID PRN. 60 g 5   No current facility-administered medications for this visit.   Vitals:   05/24/23 0911  BP: (!) 137/40  Pulse: 79  SpO2: 93%  Weight: 159 lb 2 oz (72.2 kg)   Wt Readings from Last 3 Encounters:  05/24/23 159 lb 2 oz (72.2 kg)  05/08/23 158 lb (71.7 kg)  04/24/23 158 lb 11.2 oz (72 kg)   Lab Results  Component Value Date   CREATININE 0.98 10/17/2022   CREATININE 1.03 (H) 10/08/2022   CREATININE 1.06 09/21/2022    PHYSICAL EXAM:  General: Well appearing. No resp difficulty HEENT: normal Neck: supple, no JVD Cor: Regular rhythm, rate. No rubs, gallops or murmurs Lungs: clear Abdomen: soft, nontender, nondistended. Extremities: no cyanosis, clubbing, rash, edema Neuro: alert & oriented X 3. Moves all 4 extremities w/o difficulty. Affect pleasant   ECG: not done   ASSESSMENT & PLAN:  1: NICM with preserved ejection fraction- - suspect etiology due to COPD  - NYHA class III - euvolemic - weighing daily; reviewed to call for an overnight weight gain of > 2 pounds or a weekly weight gain of > 5 pounds - weight down 4 pounds from last visit here 4 months ago - Echo 06/28/11: EF 55-60% with Grade I DD and mild AR - Echo 01/15/13: EF 55-60% with mild AR - Echo 02/16/18: EF 60-65%  - Echo 09/07/22: EF 55-60% with mildly elevated PA pressure, mild LAE and mild MR - continue farxiga  10mg   daily - continue furosemide  40mg  PRN - continue losartan  100mg  daily - continue spironolactone  12.5mg   daily - saw cardiology Gennaro Khat) 10/24 - Mg 09/08/22 was 1.7 - BNP 09/06/22 was 237.0  2: HTN- - BP 137/40 - saw PCP Cherlyn Cornet) 03/25 - BMP 10/17/22 reviewed: sodium 138, potassium 4.1, creatinine 0.98 & GFR 59 - BMET next week when she's here for pulm rehab  3: Stage II COPD- - saw pulmonology Viva Grise) 03/25 - wearing oxygen at 1L during the day and 3L at bedtime  - quit smoking ~ 2009; smoked 1.5 ppd at her peak - participating in pulmonary rehab  4: Lung [place]- - had chest CT 01/25 - saw Dr Brutus Caprice 01/25  5: Hyperlipidemia- - LDL 02/22/22 was 59 - continue atorvastatin  10mg  daily - lipid panel next week   Return in 6 months, sooner if needed.   Teresa Jensen, [place] 05/24/23

## 2023-05-24 ENCOUNTER — Ambulatory Visit: Payer: Medicare Other | Attending: Family | Admitting: Family

## 2023-05-24 ENCOUNTER — Encounter: Payer: Self-pay | Admitting: Family

## 2023-05-24 VITALS — BP 137/40 | HR 79 | Wt 159.1 lb

## 2023-05-24 DIAGNOSIS — I10 Essential (primary) hypertension: Secondary | ICD-10-CM | POA: Diagnosis not present

## 2023-05-24 DIAGNOSIS — E039 Hypothyroidism, unspecified: Secondary | ICD-10-CM | POA: Diagnosis not present

## 2023-05-24 DIAGNOSIS — J449 Chronic obstructive pulmonary disease, unspecified: Secondary | ICD-10-CM | POA: Insufficient documentation

## 2023-05-24 DIAGNOSIS — M353 Polymyalgia rheumatica: Secondary | ICD-10-CM | POA: Insufficient documentation

## 2023-05-24 DIAGNOSIS — I110 Hypertensive heart disease with heart failure: Secondary | ICD-10-CM | POA: Diagnosis not present

## 2023-05-24 DIAGNOSIS — E785 Hyperlipidemia, unspecified: Secondary | ICD-10-CM | POA: Diagnosis not present

## 2023-05-24 DIAGNOSIS — K5730 Diverticulosis of large intestine without perforation or abscess without bleeding: Secondary | ICD-10-CM | POA: Insufficient documentation

## 2023-05-24 DIAGNOSIS — C3412 Malignant neoplasm of upper lobe, left bronchus or lung: Secondary | ICD-10-CM

## 2023-05-24 DIAGNOSIS — Z85118 Personal history of other malignant neoplasm of bronchus and lung: Secondary | ICD-10-CM | POA: Insufficient documentation

## 2023-05-24 DIAGNOSIS — I428 Other cardiomyopathies: Secondary | ICD-10-CM | POA: Insufficient documentation

## 2023-05-24 DIAGNOSIS — I2510 Atherosclerotic heart disease of native coronary artery without angina pectoris: Secondary | ICD-10-CM | POA: Diagnosis not present

## 2023-05-24 DIAGNOSIS — D3502 Benign neoplasm of left adrenal gland: Secondary | ICD-10-CM | POA: Diagnosis not present

## 2023-05-24 DIAGNOSIS — R0602 Shortness of breath: Secondary | ICD-10-CM | POA: Diagnosis present

## 2023-05-24 DIAGNOSIS — Z87891 Personal history of nicotine dependence: Secondary | ICD-10-CM | POA: Diagnosis not present

## 2023-05-24 DIAGNOSIS — D649 Anemia, unspecified: Secondary | ICD-10-CM | POA: Diagnosis not present

## 2023-05-24 DIAGNOSIS — I5032 Chronic diastolic (congestive) heart failure: Secondary | ICD-10-CM | POA: Diagnosis not present

## 2023-05-24 DIAGNOSIS — I708 Atherosclerosis of other arteries: Secondary | ICD-10-CM | POA: Insufficient documentation

## 2023-05-24 DIAGNOSIS — K219 Gastro-esophageal reflux disease without esophagitis: Secondary | ICD-10-CM | POA: Insufficient documentation

## 2023-05-24 DIAGNOSIS — F419 Anxiety disorder, unspecified: Secondary | ICD-10-CM | POA: Insufficient documentation

## 2023-05-24 DIAGNOSIS — R911 Solitary pulmonary nodule: Secondary | ICD-10-CM | POA: Insufficient documentation

## 2023-05-24 DIAGNOSIS — E782 Mixed hyperlipidemia: Secondary | ICD-10-CM

## 2023-05-24 NOTE — Patient Instructions (Addendum)
 Great to see you today!!!  No changes, please continue current medications  Your physician recommends that you return for lab work in: next week, this can be done at the [facility]. We will only call you if the results are abnormal or if the provider would like to make medication changes. Labs done today, your results will be available in MyChart, we will contact you for abnormal readings.  Do the following things EVERYDAY: Weigh yourself in the morning before breakfast. Write it down and keep it in a log. Take your medicines as prescribed Eat low salt foods--Limit salt (sodium) to 2000 mg per day.  Stay as active as you can everyday Limit all fluids for the day to less than 2 liters  Your physician recommends that you schedule a follow-up appointment in: 6 months    If you have any questions or concerns before your next appointment please send us  a message through [place] or call our office at [phone], If it is after office hours your call will be answered by our answering service and directed appropriately.     At the [facility], you and your health needs are our priority. We have a designated team specialized in the treatment of Heart Failure. This Care Team includes your primary Heart Failure Specialized Cardiologist (physician), Advanced Practice Providers (APPs- Physician Assistants and Nurse Practitioners), and Pharmacist who all work together to provide you with the care you need, when you need it.   You may see any of the following providers on your designated Care Team at your next follow up:  Dr. Jules Oar Dr. Peder Bourdon Dr. Alwin Baars Dr. Judyth Nunnery Nieves Bars, NP Ruddy Corral, [place] [address] [place], [place] Dennise Fitz, NP [place] Lee, NP [place] Dellen, NP Bevely Brush, PharmD

## 2023-05-28 ENCOUNTER — Encounter: Payer: Self-pay | Admitting: Family

## 2023-05-28 ENCOUNTER — Encounter

## 2023-05-30 ENCOUNTER — Other Ambulatory Visit
Admission: RE | Admit: 2023-05-30 | Discharge: 2023-05-30 | Disposition: A | Discharge status: Home / Self Care | Source: Home / Self Care | Attending: Family | Admitting: Family

## 2023-05-30 ENCOUNTER — Ambulatory Visit: Payer: Self-pay | Admitting: Family

## 2023-05-30 ENCOUNTER — Encounter: Admitting: *Deleted

## 2023-05-30 DIAGNOSIS — J4489 Other specified chronic obstructive pulmonary disease: Secondary | ICD-10-CM

## 2023-05-30 DIAGNOSIS — I5032 Chronic diastolic (congestive) heart failure: Secondary | ICD-10-CM | POA: Insufficient documentation

## 2023-05-30 DIAGNOSIS — J8410 Pulmonary fibrosis, unspecified: Secondary | ICD-10-CM

## 2023-05-30 LAB — LIPID PANEL
Cholesterol: 124 mg/dL (ref 0–200)
HDL: 60 mg/dL (ref >40)
LDL Cholesterol: 50 mg/dL (ref 0–99)
Total CHOL/HDL Ratio: 2.1 ratio
Triglycerides: 71 mg/dL (ref <150)
VLDL: 14 mg/dL (ref 0–40)

## 2023-05-30 LAB — BASIC METABOLIC PANEL WITH GFR
Anion gap: 9 (ref 5–15)
BUN: 40 mg/dL — ABNORMAL HIGH (ref 8–23)
CO2: 28 mmol/L (ref 22–32)
Calcium: 9.8 mg/dL (ref 8.9–10.3)
Chloride: 101 mmol/L (ref 98–111)
Creatinine, Ser: 1.04 mg/dL — ABNORMAL HIGH (ref 0.44–1.00)
GFR, Estimated: 55 mL/min — ABNORMAL LOW (ref >60)
Glucose, Bld: 91 mg/dL (ref 70–99)
Potassium: 4.6 mmol/L (ref 3.5–5.1)
Sodium: 138 mmol/L (ref 135–145)

## 2023-05-30 NOTE — Addendum Note (Signed)
 Addended by: Dala Dublin on: 05/30/2023 08:48 AM   Modules accepted: Orders

## 2023-05-30 NOTE — Progress Notes (Signed)
 Daily Session Note  Patient Details  Name: Teresa Jensen MRN: [id number] Date of Birth: 1945/10/02 Referring Provider:   Gattis Kass Pulmonary Rehab from 04/24/2023 in [facility] Cardiac and Pulmonary Rehab  Referring Provider Dr. Coralie Derrick       Encounter Date: 05/30/2023  Check In:  Session Check In - 05/30/23 0744       Check-In   Supervising physician immediately available to respond to emergencies See telemetry face sheet for immediately available ER MD    Location [facility]-Cardiac & Pulmonary Rehab    Staff Present Lyell Samuel, MS, Exercise Physiologist;Alayzia Pavlock, RN, BSN, CCRP;Joseph Hood RCP,RRT,BSRT    Virtual Visit No    Medication changes reported     No    Fall or balance concerns reported    No    Warm-up and Cool-down Performed on first and last piece of equipment    Resistance Training Performed Yes    VAD Patient? No    PAD/SET Patient? No      Pain Assessment   Currently in Pain? No/denies                Social History   Tobacco Use  Smoking Status Former   Current packs/day: 0.00   Average packs/day: 1.5 packs/day for 20.0 years (30.0 ttl pk-yrs)   Types: Cigarettes   Start date: 02/11/1989   Quit date: 02/11/2009   Years since quitting: 14.3  Smokeless Tobacco Never    Goals Met:  Proper associated with RPD/PD & O2 Sat Independence with exercise equipment Exercise tolerated well No report of concerns or symptoms today  Goals Unmet:  Not Applicable  Comments: Pt able to follow exercise prescription today without complaint.  Will continue to monitor for progression.    Dr. Firman Hughes is Medical Director for [facility].  Dr. Fuad Aleskerov is Medical Director for [facility] Pulmonary Rehabilitation.

## 2023-06-04 ENCOUNTER — Encounter: Attending: Pulmonary Disease

## 2023-06-04 DIAGNOSIS — J8410 Pulmonary fibrosis, unspecified: Secondary | ICD-10-CM | POA: Diagnosis present

## 2023-06-04 DIAGNOSIS — J449 Chronic obstructive pulmonary disease, unspecified: Secondary | ICD-10-CM | POA: Diagnosis present

## 2023-06-04 DIAGNOSIS — J4489 Other specified chronic obstructive pulmonary disease: Secondary | ICD-10-CM | POA: Diagnosis present

## 2023-06-04 NOTE — Progress Notes (Signed)
 Daily Session Note  Patient Details  Name: Teresa Jensen MRN: [id number] Date of Birth: 1945-07-10 Referring Provider:   Gattis Kass Pulmonary Rehab from 04/24/2023 in [facility] Cardiac and Pulmonary Rehab  Referring Provider Dr. Coralie Derrick       Encounter Date: 06/04/2023  Check In:  Session Check In - 06/04/23 0740       Check-In   Supervising physician immediately available to respond to emergencies See telemetry face sheet for immediately available ER MD    Location [facility]-Cardiac & Pulmonary Rehab    Staff Present Sherle Dire, BS, Exercise Physiologist;Jason Martina Sledge RDN,LDN;Susanne Bice, RN, BSN, CCRP;Marshaun Lortie RN,BSN,MPA    Virtual Visit No    Medication changes reported     No    Fall or balance concerns reported    No    Warm-up and Cool-down Performed on first and last piece of equipment    Resistance Training Performed Yes    VAD Patient? No    PAD/SET Patient? No      Pain Assessment   Currently in Pain? No/denies                Social History   Tobacco Use  Smoking Status Former   Current packs/day: 0.00   Average packs/day: 1.5 packs/day for 20.0 years (30.0 ttl pk-yrs)   Types: Cigarettes   Start date: 02/11/1989   Quit date: 02/11/2009   Years since quitting: 14.3  Smokeless Tobacco Never    Goals Met:  Independence with exercise equipment Exercise tolerated well No report of concerns or symptoms today Strength training completed today  Goals Unmet:  Not Applicable  Comments: Pt able to follow exercise prescription today without complaint.  Will continue to monitor for progression.    Dr. Firman Hughes is Medical Director for [facility] Cardiac Rehabilitation.  Dr. Fuad Aleskerov is Medical Director for [facility] Pulmonary Rehabilitation.

## 2023-06-05 ENCOUNTER — Encounter: Payer: Self-pay | Admitting: *Deleted

## 2023-06-05 ENCOUNTER — Encounter: Payer: Self-pay | Admitting: Family

## 2023-06-05 DIAGNOSIS — J4489 Other specified chronic obstructive pulmonary disease: Secondary | ICD-10-CM

## 2023-06-05 DIAGNOSIS — J8410 Pulmonary fibrosis, unspecified: Secondary | ICD-10-CM

## 2023-06-05 NOTE — Progress Notes (Signed)
 Pulmonary Individual Treatment Plan  Patient Details  Name: Teresa Jensen MRN: [id number] Date of Birth: [birth year] Referring Provider:   Gattis Kass Pulmonary Rehab from 04/24/2023 in [facility] Cardiac and Pulmonary Rehab  Referring Provider Dr. Coralie Derrick       Initial Encounter Date:  Flowsheet Row Pulmonary Rehab from 04/24/2023 in [facility] Cardiac and Pulmonary Rehab  Date 04/24/23       Visit Diagnosis: COPD  Pulmonary fibrosis (HCC)  Patient's Home Medications on Admission:  Current Outpatient Medications:    alendronate  (FOSAMAX ) 70 MG tablet, Take 70 mg by mouth once a week. Take with a full glass of water on an empty stomach., Disp: , Rfl:    atorvastatin  (LIPITOR) 10 MG tablet, TAKE 1 TABLET BY MOUTH DAILY. FOR CHOLESTEROL, Disp: 90 tablet, Rfl: 1   Calcium  Carb-Cholecalciferol (CALCIUM  + VITAMIN D3 PO), Take 1,200 mg by mouth daily., Disp: , Rfl:    dapagliflozin propanediol  (FARXIGA ) 10 MG TABS tablet, TAKE 1 TABLET BY MOUTH DAILY BEFORE BREAKFAST., Disp: 90 tablet, Rfl: 3   famotidine  (PEPCID ) 20 MG tablet, TAKE 1 TABLET BY MOUTH AFTER SUPPER, Disp: 90 tablet, Rfl: 3   furosemide  (LASIX ) 40 MG tablet, Take 1 tablet (40 mg total) by mouth daily., Disp: 30 tablet, Rfl: 0   Glycopyrrolate -Formoterol  (BEVESPI  AEROSPHERE) 9-4.8 MCG/ACT AERO, Inhale 2 puffs into the lungs 2 (two) times daily., Disp: 3 each, Rfl: 3   levothyroxine  (SYNTHROID ) 75 MCG tablet, TAKE 1 EVERY AM ON AN EMPTY STOMACH WITH WATER ONLY. NO FOOD OR OTHER MEDICATIONS FOR 30 MINUTES., Disp: 90 tablet, Rfl: 1   losartan  (COZAAR ) 100 MG tablet, TAKE 1 TABLET (100 MG TOTAL) BY MOUTH DAILY. PLEASE CALL [phone] TO SCHEDULE A YEARLY APPOINTMENT. THANK YOU., Disp: 90 tablet, Rfl: 3   Multiple Vitamins-Minerals (PRESERVISION AREDS 2 PO), Take 1 tablet by mouth in the morning and at bedtime., Disp: , Rfl:    nystatin  cream (MYCOSTATIN ), Apply 1 Application topically 2 (two) times daily., Disp: 30 g, Rfl: 1    pantoprazole  (PROTONIX ) 40 MG tablet, Take 1 tablet (40 mg total) by mouth daily. for heartburn., Disp: 90 tablet, Rfl: 3   pentoxifylline  (TRENTAL ) 400 MG CR tablet, Take 1 tablet (400 mg total) by mouth in the morning and at bedtime., Disp: 180 tablet, Rfl: 1   spironolactone  (ALDACTONE ) 25 MG tablet, Take 0.5 tablets (12.5 mg total) by mouth daily., Disp: 45 tablet, Rfl: 3   tacrolimus  (PROTOPIC ) 0.1 % ointment, Apply to aa's lower legs BID PRN., Disp: 60 g, Rfl: 5  Past Medical History: Past Medical History:  Diagnosis Date   (HFpEF) heart failure with preserved ejection fraction (HCC) 06/28/2011   a.) TTE 06/28/2011: EF 55-60%, mild AR, G1DD; b.) TTE 01/16/2013: EF 55-60%, mild MR; c.) TTE 02/16/2018: EF 60-65%, G1DD; d.) MPI 11/26/2018: EF 56%   Abnormal electrocardiogram 11/12/2018   Acute kidney failure (HCC) 01/12/2017   Acute on chronic respiratory failure with hypoxia (HCC) 09/06/2022   Adenoma of left adrenal gland    Anemia    Anxiety    Aortic atherosclerosis (HCC)    Bilateral carpal tunnel syndrome    CAD (coronary artery disease)    a.) HR CT chest 10/30/2021: 2 vessel CAD (LAD/RCA)   CAP (community acquired pneumonia)    COPD (chronic obstructive pulmonary disease) (HCC)    COPD exacerbation (HCC) 01/21/2018   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   Diverticulosis    Dyspnea    DUE TO HEAT WITH COPD  Epistaxis 01/09/2017   GERD (gastroesophageal reflux disease)    Herpes zoster 07/08/2015   Hypertension    Hypothyroidism    Iatrogenic hypotension 01/12/2017   Iliac artery stenosis, bilateral (HCC)    Morbid obesity with BMI of 40.0-44.9, adult (HCC)    New onset of headaches    Osteopenia    PMR (polymyalgia rheumatica) (HCC)    Pneumonia 02/13/2018   PONV (postoperative nausea and vomiting)    Pulmonary nodule, left    a.) CT chest 10/30/2021: 1.5 cm medial LUL nodule; b.) PET CT 11/21/2021: hypermetabolic LUL nodule (SUX max 9.3)   S/P carpal tunnel  release 08/04/2020   Shingles    Stasis dermatitis    Strain of chest wall 01/20/2011   Tobacco abuse    Vitamin D  deficiency     Tobacco Use: Social History   Tobacco Use  Smoking Status Former   Current packs/day: 0.00   Average packs/day: 1.5 packs/day for 20.0 years (30.0 ttl pk-yrs)   Types: Cigarettes   Start date: 02/11/1989   Quit date: 02/11/2009   Years since quitting: 14.3  Smokeless Tobacco Never    Labs: Review Flowsheet  More data exists      Latest Ref Rng & Units 10/24/2018 10/27/2019 02/28/2021 02/22/2022 05/30/2023  Labs for ITP Cardiac and Pulmonary Rehab  Cholestrol 0 - 200 mg/dL 811  914  782  956  213   LDL (calc) 0 - 99 mg/dL 95  82  66  59  50   HDL-C >40 mg/dL 08.65  78.46  96.29  52.84  60   Trlycerides <150 mg/dL 132.4  40.1  027.2  536.6  71   Hemoglobin A1c 4.6 - 6.5 % 6.1  6.4  5.3  5.5  -     Pulmonary Assessment Scores:  Pulmonary Assessment Scores     Row Name 04/24/23 1129         ADL UCSD   ADL Phase Entry     SOB Score total 55     Rest 3     Walk 3     Stairs 4     Bath 2     Dress 2     Shop 2       CAT Score   CAT Score 21       mMRC Score   mMRC Score 2              UCSD: Self-administered rating of dyspnea associated with activities of daily living (ADLs) 6-point scale (0 = "not at all" to 5 = "maximal or unable to do because of breathlessness")  Scoring Scores range from 0 to 120.  Minimally important difference is 5 units  CAT: CAT can identify the health impairment of COPD patients and is better correlated with disease progression.  CAT has a scoring range of zero to 40. The CAT score is classified into four groups of low (less than 10), medium (10 - 20), high (21-30) and very high (31-40) based on the impact level of disease on health status. A CAT score over 10 suggests significant symptoms.  A worsening CAT score could be explained by an exacerbation, poor medication adherence, poor inhaler technique, or  progression of COPD or comorbid conditions.  CAT MCID is 2 points  mMRC: mMRC (Modified Medical Research Council) Dyspnea Scale is used to assess the degree of baseline functional disability in patients of respiratory disease due to dyspnea. No minimal important difference is established. A  decrease in score of 1 point or greater is considered a positive change.   Pulmonary Function Assessment:   Exercise Target Goals: Exercise Program Goal: Individual exercise prescription set using results from initial 6 min walk test and THRR while considering  patient's activity barriers and safety.   Exercise Prescription Goal: Initial exercise prescription builds to 30-45 minutes a day of aerobic activity, 2-3 days per week.  Home exercise guidelines will be given to patient during program as part of exercise prescription that the participant will acknowledge.  Education: Aerobic Exercise: - Group verbal and visual presentation on the components of exercise prescription. Introduces F.I.T.T principle from ACSM for exercise prescriptions.  Reviews F.I.T.T. principles of aerobic exercise including progression. Written material given at graduation. Flowsheet Row Pulmonary Rehab from 04/24/2023 in [facility] Cardiac and Pulmonary Rehab  Education need identified 04/24/23       Education: Resistance Exercise: - Group verbal and visual presentation on the components of exercise prescription. Introduces F.I.T.T principle from ACSM for exercise prescriptions  Reviews F.I.T.T. principles of resistance exercise including progression. Written material given at graduation.    Education: Exercise & Equipment Safety: - Individual verbal instruction and demonstration of equipment use and safety with use of the equipment. Flowsheet Row Pulmonary Rehab from 04/24/2023 in [facility] Cardiac and Pulmonary Rehab  Date 04/24/23  Educator NT  Instruction Review Code 1- Verbalizes Understanding       Education: Exercise  Physiology & General Exercise Guidelines: - Group verbal and written instruction with models to review the exercise physiology of the cardiovascular system and associated critical values. Provides general exercise guidelines with specific guidelines to those with heart or lung disease.    Education: Flexibility, Balance, Mind/Body Relaxation: - Group verbal and visual presentation with interactive activity on the components of exercise prescription. Introduces F.I.T.T principle from ACSM for exercise prescriptions. Reviews F.I.T.T. principles of flexibility and balance exercise training including progression. Also discusses the mind body connection.  Reviews various relaxation techniques to help reduce and manage stress (i.e. Deep breathing, progressive muscle relaxation, and visualization). Balance handout provided to take home. Written material given at graduation.   Activity Barriers & Risk Stratification:  Activity Barriers & Cardiac Risk Stratification - 04/24/23 1153       Activity Barriers & Cardiac Risk Stratification   Activity Barriers Shortness of Breath;Arthritis             6 Minute Walk:  6 Minute Walk     Row Name 04/24/23 1137         6 Minute Walk   Phase Initial     Distance 615 feet     Walk Time 4.73 minutes     # of Rest Breaks 3     MPH 1.48     METS 1.18     RPE 13     Perceived Dyspnea  2     VO2 Peak 4.13     Symptoms No     Resting HR 77 bpm     Resting BP 122/64     Resting Oxygen Saturation  95 %     Exercise Oxygen Saturation  during 6 min walk 90 %     Max Ex. HR 119 bpm     Max Ex. BP 142/66     2 Minute Post BP 120/62       Interval HR   1 Minute HR 105     2 Minute HR 111     3 Minute HR 119  4 Minute HR 112     5 Minute HR 115     6 Minute HR 119     2 Minute Post HR 79     Interval Heart Rate? Yes       Interval Oxygen   Interval Oxygen? Yes     Baseline Oxygen Saturation % 95 %     1 Minute Oxygen Saturation % 93 %      1 Minute Liters of Oxygen 1.5 L     2 Minute Oxygen Saturation % 91 %     2 Minute Liters of Oxygen 1.5 L     3 Minute Oxygen Saturation % 90 %     3 Minute Liters of Oxygen 1.5 L     4 Minute Oxygen Saturation % 92 %     4 Minute Liters of Oxygen 1.5 L     5 Minute Oxygen Saturation % 91 %     5 Minute Liters of Oxygen 1.5 L     6 Minute Oxygen Saturation % 91 %     6 Minute Liters of Oxygen 1.5 L     2 Minute Post Oxygen Saturation % 95 %     2 Minute Post Liters of Oxygen 1.5 L             Oxygen Initial Assessment:  Oxygen Initial Assessment - 04/18/23 1441       Home Oxygen   Home Oxygen Device Home Concentrator;E-Tanks    Sleep Oxygen Prescription Continuous    Liters per minute 2    Home Exercise Oxygen Prescription Continuous    Liters per minute 1.5    Home Resting Oxygen Prescription Continuous    Liters per minute 1.5   as needed  only when sat drops below 88%   Compliance with Home Oxygen Use Yes      Intervention   Short Term Goals To learn and exhibit compliance with exercise, home and travel O2 prescription;To learn and understand importance of monitoring SPO2 with pulse oximeter and demonstrate accurate use of the pulse oximeter.;To learn and understand importance of maintaining oxygen saturations>88%;To learn and demonstrate proper pursed lip breathing techniques or other breathing techniques. ;To learn and demonstrate proper use of respiratory medications    Long  Term Goals Exhibits compliance with exercise, home  and travel O2 prescription;Verbalizes importance of monitoring SPO2 with pulse oximeter and return demonstration;Maintenance of O2 saturations>88%;Exhibits proper breathing techniques, such as pursed lip breathing or other method taught during program session;Compliance with respiratory medication;Demonstrates proper use of MDI's             Oxygen Re-Evaluation:  Oxygen Re-Evaluation     Row Name 04/30/23 0826 05/30/23 0802            Program Oxygen Prescription   Program Oxygen Prescription Continuous Continuous;E-Tanks      Liters per minute 2 2        Home Oxygen   Home Oxygen Device Home Concentrator;E-Tanks Home Concentrator;E-Tanks      Sleep Oxygen Prescription Continuous Continuous      Liters per minute 2 1      Home Exercise Oxygen Prescription Continuous Continuous      Liters per minute 1.5 2      Home Resting Oxygen Prescription Continuous Continuous      Liters per minute 1.5 0  prn      Compliance with Home Oxygen Use Yes Yes        Goals/Expected Outcomes  Short Term Goals To learn and demonstrate proper pursed lip breathing techniques or other breathing techniques.  To learn and demonstrate proper pursed lip breathing techniques or other breathing techniques.       Long  Term Goals Exhibits proper breathing techniques, such as pursed lip breathing or other method taught during program session Exhibits proper breathing techniques, such as pursed lip breathing or other method taught during program session      Comments Reviewed PLB technique with pt.  Talked about how it works and it's importance in maintaining their exercise saturations. Informed patient how to perform the Pursed Lipped breathing technique. Told patient to Inhale through the nose and out the mouth with pursed lips to keep their airways open, help oxygenate them better, practice when at rest or doing strenuous activity. Patient Verbalizes understanding of technique and will work on and be reiterated during [facility].      Goals/Expected Outcomes Short: Become more profiecient at using PLB. Long: Become independent at using PLB. Short: use PLB with exertion. Long: use PLB on exertion proficiently and independently.               Oxygen Discharge (Final Oxygen Re-Evaluation):  Oxygen Re-Evaluation - 05/30/23 0802       Program Oxygen Prescription   Program Oxygen Prescription Continuous;E-Tanks    Liters per minute 2      Home  Oxygen   Home Oxygen Device Home Concentrator;E-Tanks    Sleep Oxygen Prescription Continuous    Liters per minute 1    Home Exercise Oxygen Prescription Continuous    Liters per minute 2    Home Resting Oxygen Prescription Continuous    Liters per minute 0   prn   Compliance with Home Oxygen Use Yes      Goals/Expected Outcomes   Short Term Goals To learn and demonstrate proper pursed lip breathing techniques or other breathing techniques.     Long  Term Goals Exhibits proper breathing techniques, such as pursed lip breathing or other method taught during program session    Comments Informed patient how to perform the Pursed Lipped breathing technique. Told patient to Inhale through the nose and out the mouth with pursed lips to keep their airways open, help oxygenate them better, practice when at rest or doing strenuous activity. Patient Verbalizes understanding of technique and will work on and be reiterated during [facility].    Goals/Expected Outcomes Short: use PLB with exertion. Long: use PLB on exertion proficiently and independently.             Initial Exercise Prescription:  Initial Exercise Prescription - 04/24/23 1100       Date of Initial Exercise RX and Referring Provider   Date 04/24/23    Referring Provider Dr. Coralie Derrick      Oxygen   Maintain Oxygen Saturation 88% or higher      Recumbant Bike   Level 1    RPM 50    Watts 15    Minutes 15    METs 1.18      NuStep   Level 1    SPM 80    Minutes 15    METs 1.18      Track   Laps 12    Minutes 15    METs 1.65      Prescription Details   Frequency (times per week) 2    Duration Progress to 30 minutes of continuous aerobic without signs/symptoms of physical distress  Intensity   THRR 40-80% of Max Heartrate 103-129    Ratings of Perceived Exertion 11-13    Perceived Dyspnea 0-4      Progression   Progression Continue to progress workloads to maintain intensity without signs/symptoms  of physical distress.      Resistance Training   Training Prescription Yes    Weight 3 lb    Reps 10-15             Perform Capillary Blood Glucose checks as needed.  Exercise Prescription Changes:   Exercise Prescription Changes     Row Name 04/24/23 1100 05/15/23 1300 05/30/23 1800         Response to Exercise   Blood Pressure (Admit) 122/64 126/64 168/62     Blood Pressure (Exercise) 142/66 138/70 138/58     Blood Pressure (Exit) 120/62 122/60 130/60     Heart Rate (Admit) 77 bpm 74 bpm 81 bpm     Heart Rate (Exercise) 119 bpm 117 bpm 111 bpm     Heart Rate (Exit) 79 bpm 83 bpm 82 bpm     Oxygen Saturation (Admit) 95 % 94 % 96 %     Oxygen Saturation (Exercise) 90 % 90 % 89 %     Oxygen Saturation (Exit) 95 % 92 % 91 %     Rating of Perceived Exertion (Exercise) 13 15 13      Perceived Dyspnea (Exercise) 2 2 2      Symptoms none none none     Comments Results First 2 weeks of exercise --     Duration -- Progress to 30 minutes of  aerobic without signs/symptoms of physical distress Continue with 30 min of aerobic exercise without signs/symptoms of physical distress.     Intensity -- THRR unchanged THRR unchanged       Progression   Progression -- Continue to progress workloads to maintain intensity without signs/symptoms of physical distress. Continue to progress workloads to maintain intensity without signs/symptoms of physical distress.     Average METs -- 1.7 1.7       Resistance Training   Training Prescription -- Yes Yes     Weight -- 3 3     Reps -- 10-15 10-15       Interval Training   Interval Training -- No No       Oxygen   Oxygen -- -- Continuous     Liters -- -- 2       Recumbant Bike   Level -- 1 --     Watts -- 6 --     Minutes -- 15 --     METs -- 2.26 --       NuStep   Level -- 1 1     Minutes -- 15 30     METs -- 2 2.2       Track   Laps -- 4 7  hall     Minutes -- 15 15     METs -- 1.27 1.38       Oxygen   Maintain Oxygen  Saturation -- 88% or higher 88% or higher              Exercise Comments:   Exercise Comments     Row Name 04/30/23 0825           Exercise Comments First full day of exercise!  Patient was oriented to gym and equipment including functions, settings, policies, and procedures.  Patient's individual exercise prescription  and treatment plan were reviewed.  All starting workloads were established based on the results of the 6 minute walk test done at initial orientation visit.  The plan for exercise progression was also introduced and progression will be customized based on patient's performance and goals.                Exercise Goals and Review:   Exercise Goals     Row Name 04/24/23 1153             Exercise Goals   Increase Physical Activity Yes       Intervention Provide advice, education, support and counseling about physical activity/exercise needs.;Develop an individualized exercise prescription for aerobic and resistive training based on initial evaluation findings, risk stratification, comorbidities and participant's personal goals.       Expected Outcomes Short Term: Attend rehab on a regular basis to increase amount of physical activity.;Long Term: Add in home exercise to make exercise part of routine and to increase amount of physical activity.;Long Term: Exercising regularly at least 3-5 days a week.       Increase Strength and Stamina Yes       Intervention Provide advice, education, support and counseling about physical activity/exercise needs.;Develop an individualized exercise prescription for aerobic and resistive training based on initial evaluation findings, risk stratification, comorbidities and participant's personal goals.       Expected Outcomes Short Term: Increase workloads from initial exercise prescription for resistance, speed, and METs.;Long Term: Improve cardiorespiratory fitness, muscular endurance and strength as measured by increased METs and  functional capacity ( );Short Term: Perform resistance training exercises routinely during rehab and add in resistance training at home       Able to understand and use rate of perceived exertion (RPE) scale Yes       Intervention Provide education and explanation on how to use RPE scale       Expected Outcomes Short Term: Able to use RPE daily in rehab to express subjective intensity level;Long Term:  Able to use RPE to guide intensity level when exercising independently       Able to understand and use Dyspnea scale Yes       Intervention Provide education and explanation on how to use Dyspnea scale       Expected Outcomes Short Term: Able to use Dyspnea scale daily in rehab to express subjective sense of shortness of breath during exertion;Long Term: Able to use Dyspnea scale to guide intensity level when exercising independently       Knowledge and understanding of Target Heart Rate Range (THRR) Yes       Intervention Provide education and explanation of THRR including how the numbers were predicted and where they are located for reference       Expected Outcomes Short Term: Able to state/look up THRR;Long Term: Able to use THRR to govern intensity when exercising independently;Short Term: Able to use daily as guideline for intensity in rehab       Able to check pulse independently Yes       Intervention Provide education and demonstration on how to check pulse in carotid and radial arteries.;Review the importance of being able to check your own pulse for safety during independent exercise       Expected Outcomes Short Term: Able to explain why pulse checking is important during independent exercise;Long Term: Able to check pulse independently and accurately       Understanding of Exercise Prescription Yes  Intervention Provide education, explanation, and written materials on patient's individual exercise prescription       Expected Outcomes Short Term: Able to explain program exercise  prescription;Long Term: Able to explain home exercise prescription to exercise independently                Exercise Goals Re-Evaluation :  Exercise Goals Re-Evaluation     Row Name 04/30/23 1610 05/15/23 1326 05/30/23 1809         Exercise Goal Re-Evaluation   Exercise Goals Review Able to understand and use rate of perceived exertion (RPE) scale;Able to understand and use Dyspnea scale;Knowledge and understanding of Target Heart Rate Range (THRR);Understanding of Exercise Prescription Increase Physical Activity;Increase Strength and Stamina;Understanding of Exercise Prescription Increase Physical Activity;Increase Strength and Stamina;Understanding of Exercise Prescription     Comments Reviewed RPE and dyspnea scale, THR and program prescription with pt today.  Pt voiced understanding and was given a copy of goals to take home. Teresa Jensen is off to a good start in the program. She was able to attend her first 3 sessions during this review period. During these few sessions she was able use the recumbent bike and T4 nustep at level 1, and walk 4 laps on the track. We will continue to monitor her progress in the program. Teresa Jensen is off to a good start in the program. She maintained level 1 on the T4 nustep. She was also able to increase to 7 laps on the hall track. We will continue to monitor her progress in the program.     Expected Outcomes Short: Use RPE daily to regulate intensity. Long: Follow program prescription in THR. Short: Continue to follow exercise prescription. Long: Continue exercise to improve strength and stamina. Short: Try level 2 on the T4 nustep. Long: Continue exercise to improve strength and stamina.              Discharge Exercise Prescription (Final Exercise Prescription Changes):  Exercise Prescription Changes - 05/30/23 1800       Response to Exercise   Blood Pressure (Admit) 168/62    Blood Pressure (Exercise) 138/58    Blood Pressure (Exit) 130/60    Heart Rate  (Admit) 81 bpm    Heart Rate (Exercise) 111 bpm    Heart Rate (Exit) 82 bpm    Oxygen Saturation (Admit) 96 %    Oxygen Saturation (Exercise) 89 %    Oxygen Saturation (Exit) 91 %    Rating of Perceived Exertion (Exercise) 13    Perceived Dyspnea (Exercise) 2    Symptoms none    Duration Continue with 30 min of aerobic exercise without signs/symptoms of physical distress.    Intensity THRR unchanged      Progression   Progression Continue to progress workloads to maintain intensity without signs/symptoms of physical distress.    Average METs 1.7      Resistance Training   Training Prescription Yes    Weight 3    Reps 10-15      Interval Training   Interval Training No      Oxygen   Oxygen Continuous    Liters 2      NuStep   Level 1    Minutes 30    METs 2.2      Track   Laps 7   hall   Minutes 15    METs 1.38      Oxygen   Maintain Oxygen Saturation 88% or higher  Nutrition:  Target Goals: Understanding of nutrition guidelines, daily intake of sodium 1500mg , cholesterol 200mg , calories 30% from fat and 7% or less from saturated fats, daily to have 5 or more servings of fruits and vegetables.  Education: All About Nutrition: -Group instruction provided by verbal, written material, interactive activities, discussions, models, and posters to present general guidelines for heart healthy nutrition including fat, fiber, MyPlate, the role of sodium in heart healthy nutrition, utilization of the nutrition label, and utilization of this knowledge for meal planning. Follow up email sent as well. Written material given at graduation.   Biometrics:  Pre Biometrics - 04/24/23 1154       Pre Biometrics   Height 4\' 11"  (1.499 m)    Weight 158 lb 11.2 oz (72 kg)    Waist Circumference 40 inches    Hip Circumference 42 inches    Waist to Hip Ratio 0.95 %    BMI (Calculated) 32.04    Single Leg Stand 6.78 seconds              Nutrition Therapy  Plan and Nutrition Goals:  Nutrition Therapy & Goals - 04/24/23 1338       Nutrition Therapy   Diet Cardiac, low Na    Protein (specify units) 70-90    Fiber 25 grams    Whole Grain Foods 3 servings    Saturated Fats 15 max. grams    Fruits and Vegetables 5 servings/day    Sodium 2 grams      Personal Nutrition Goals   Nutrition Goal Eat 15-30gProtein and 30-60gCarbs at each meal.    Personal Goal #2 Read labels and reduce sodium intake to below 2300mg . Ideally 1500mg  per day.    Personal Goal #3 Drink 48-64oz of water daily    Comments Patient drinking ~48oz of water. Sometimes will have diet pepsi. She usually eats 3 meals per day. Lunch being her largest meal. She says she usually has veggies at least 1 time per day and likes to snack on fruit. Reviewed mediterranean diet handout. Educated on types of fats, sources, and how to read labels. She watches her sodium intake and says most days she feels she eats well, but sometimes she wants a hotdog. Encouraged and supported making change that are sustainable and allows for some indulgences.      Intervention Plan   Intervention Prescribe, educate and counsel regarding individualized specific dietary modifications aiming towards targeted core components such as weight, hypertension, lipid management, diabetes, heart failure and other comorbidities.;Nutrition handout(s) given to patient.    Expected Outcomes Short Term Goal: Understand basic principles of dietary content, such as calories, fat, sodium, cholesterol and nutrients.;Short Term Goal: A plan has been developed with personal nutrition goals set during dietitian appointment.;Long Term Goal: Adherence to prescribed nutrition plan.             Nutrition Assessments:  MEDIFICTS Score Key: >=70 Need to make dietary changes  40-70 Heart Healthy Diet <= 40 Therapeutic Level Cholesterol Diet  Flowsheet Row Pulmonary Rehab from 04/24/2023 in [facility] Cardiac and Pulmonary Rehab  Picture  Your Plate Total Score on Admission 77      Picture Your Plate Scores: <16 Unhealthy dietary pattern with much room for improvement. 41-50 Dietary pattern unlikely to meet recommendations for good health and room for improvement. 51-60 More healthful dietary pattern, with some room for improvement.  >60 Healthy dietary pattern, although there may be some specific behaviors that could be improved.   Nutrition Goals  Re-Evaluation:  Nutrition Goals Re-Evaluation     Row Name 05/30/23 0801             Goals   Comment Patient was informed on why it is important to maintain a balanced diet when dealing with Respiratory issues. Explained that it takes a lot of energy to breath and when they are short of breath often they will need to have a good diet to help keep up with the calories they are expending for breathing.       Expected Outcome Short: Choose and plan snacks accordingly to patients caloric intake to improve breathing. Long: Maintain a diet independently that meets their caloric intake to aid in daily shortness of breath.                Nutrition Goals Discharge (Final Nutrition Goals Re-Evaluation):  Nutrition Goals Re-Evaluation - 05/30/23 0801       Goals   Comment Patient was informed on why it is important to maintain a balanced diet when dealing with Respiratory issues. Explained that it takes a lot of energy to breath and when they are short of breath often they will need to have a good diet to help keep up with the calories they are expending for breathing.    Expected Outcome Short: Choose and plan snacks accordingly to patients caloric intake to improve breathing. Long: Maintain a diet independently that meets their caloric intake to aid in daily shortness of breath.             Psychosocial: Target Goals: Acknowledge presence or absence of significant depression and/or stress, maximize coping skills, provide positive support system. Participant is able to  verbalize types and ability to use techniques and skills needed for reducing stress and depression.   Education: Stress, Anxiety, and Depression - Group verbal and visual presentation to define topics covered.  Reviews how body is impacted by stress, anxiety, and depression.  Also discusses healthy ways to reduce stress and to treat/manage anxiety and depression.  Written material given at graduation.   Education: Sleep Hygiene -Provides group verbal and written instruction about how sleep can affect your health.  Define sleep hygiene, discuss sleep cycles and impact of sleep habits. Review good sleep hygiene tips.    Initial Review & Psychosocial Screening:  Initial Psych Review & Screening - 04/18/23 1442       Initial Review   Current issues with None Identified      Family Dynamics   Good Support System? Yes   husband  56 yrs,     Barriers   Psychosocial barriers to participate in program There are no identifiable barriers or psychosocial needs.      Screening Interventions   Interventions To provide support and resources with identified psychosocial needs;Provide feedback about the scores to participant;Encouraged to exercise    Expected Outcomes Short Term goal: Utilizing psychosocial counselor, staff and physician to assist with identification of specific Stressors or current issues interfering with healing process. Setting desired goal for each stressor or current issue identified.;Long Term Goal: Stressors or current issues are controlled or eliminated.;Short Term goal: Identification and review with participant of any Quality of Life or Depression concerns found by scoring the questionnaire.;Long Term goal: The participant improves quality of Life and PHQ9 Scores as seen by post scores and/or verbalization of changes             Quality of Life Scores:  Scores of 19 and below usually indicate a poorer  quality of life in these areas.  A difference of  2-3 points is a  clinically meaningful difference.  A difference of 2-3 points in the total score of the Quality of Life Index has been associated with significant improvement in overall quality of life, self-image, physical symptoms, and general health in studies assessing change in quality of life.  PHQ-9: Review Flowsheet  More data exists      05/30/2023 05/08/2023 04/24/2023 01/22/2023 11/21/2022  Depression screen PHQ 2/9  Decreased Interest 0 0 0 0 0 0  Down, Depressed, Hopeless 0 1 2 0 0 0  PHQ - 2 Score 0 1 2 0 0 0  Altered sleeping 3 - 2 0 -  Tired, decreased energy 0 - 1 0 -  Change in appetite 0 - 2 0 -  Feeling bad or failure about yourself  0 - 0 0 -  Trouble concentrating 0 - 0 0 -  Moving slowly or fidgety/restless 0 - 0 0 -  Suicidal thoughts 0 - 0 0 -  PHQ-9 Score 3 - 7 0 -  Difficult doing work/chores Not difficult at all - Somewhat difficult Not difficult at all -    Details       Multiple values from one day are sorted in reverse-chronological order        Interpretation of Total Score  Total Score Depression Severity:  1-4 = Minimal depression, 5-9 = Mild depression, 10-14 = Moderate depression, 15-19 = Moderately severe depression, 20-27 = Severe depression   Psychosocial Evaluation and Intervention:  Psychosocial Evaluation - 04/18/23 1500       Psychosocial Evaluation & Interventions   Interventions Encouraged to exercise with the program and follow exercise prescription    Comments Thre are no barriers to attending the program.  She lives with her husband (14yrs) and he is her support. She is ready to start the program    Expected Outcomes STG attend all scheduled sessions, work on exercise progression as guided by the exercise physiologistLTG maintian exercise prgogression after discharge, looking for decreased shaortness of breath    Continue Psychosocial Services  Follow up required by staff             Psychosocial Re-Evaluation:  Psychosocial Re-Evaluation      Row Name 05/30/23 0808             Psychosocial Re-Evaluation   Current issues with None Identified       Comments Reviewed patient health questionnaire (PHQ-9) with patient for follow up. Previously, patients score indicated signs/symptoms of depression.  Reviewed to see if patient is improving symptom wise while in program.  Score improved and patient states that it is because she is able to work on her health.       Expected Outcomes Short: Continue to attend [facility] regularly for regular exercise and social engagement. Long: Continue to improve symptoms and manage a positive mental state.       Interventions Encouraged to attend Pulmonary Rehabilitation for the exercise       Continue Psychosocial Services  Follow up required by staff                Psychosocial Discharge (Final Psychosocial Re-Evaluation):  Psychosocial Re-Evaluation - 05/30/23 0808       Psychosocial Re-Evaluation   Current issues with None Identified    Comments Reviewed patient health questionnaire (PHQ-9) with patient for follow up. Previously, patients score indicated signs/symptoms of depression.  Reviewed to see if patient  is improving symptom wise while in program.  Score improved and patient states that it is because she is able to work on her health.    Expected Outcomes Short: Continue to attend [facility] regularly for regular exercise and social engagement. Long: Continue to improve symptoms and manage a positive mental state.    Interventions Encouraged to attend Pulmonary Rehabilitation for the exercise    Continue Psychosocial Services  Follow up required by staff             Education: Education Goals: Education classes will be provided on a weekly basis, covering required topics. Participant will state understanding/return demonstration of topics presented.  Learning Barriers/Preferences:   General Pulmonary Education Topics:  Infection Prevention: - Provides verbal and  written material to individual with discussion of infection control including proper hand washing and proper equipment cleaning during exercise session. Flowsheet Row Pulmonary Rehab from 04/24/2023 in [facility] Cardiac and Pulmonary Rehab  Date 04/24/23  Educator NT  Instruction Review Code 1- Verbalizes Understanding       Falls Prevention: - Provides verbal and written material to individual with discussion of falls prevention and safety. Flowsheet Row Pulmonary Rehab from 04/24/2023 in [facility] Cardiac and Pulmonary Rehab  Date 04/18/23  Educator SB  Instruction Review Code 1- Verbalizes Understanding       Chronic Lung Disease Review: - Group verbal instruction with posters, models, PowerPoint presentations and videos,  to review new updates, new respiratory medications, new advancements in procedures and treatments. Providing information on websites and "800" numbers for continued self-education. Includes information about supplement oxygen, available portable oxygen systems, continuous and intermittent flow rates, oxygen safety, concentrators, and Medicare reimbursement for oxygen. Explanation of Pulmonary Drugs, including class, frequency, complications, importance of spacers, rinsing mouth after steroid MDI's, and proper cleaning methods for nebulizers. Review of basic lung anatomy and physiology related to function, structure, and complications of lung disease. Review of risk factors. Discussion about methods for diagnosing sleep apnea and types of masks and machines for OSA. Includes a review of the use of types of environmental controls: home humidity, furnaces, filters, dust mite/pet prevention, HEPA vacuums. Discussion about weather changes, air quality and the benefits of nasal washing. Instruction on Warning signs, infection symptoms, calling MD promptly, preventive modes, and value of vaccinations. Review of effective airway clearance, coughing and/or vibration techniques. Emphasizing that  all should Create an Action Plan. Written material given at graduation. Flowsheet Row Pulmonary Rehab from 04/24/2023 in [facility] Cardiac and Pulmonary Rehab  Education need identified 04/24/23       AED/CPR: - Group verbal and written instruction with the use of models to demonstrate the basic use of the AED with the basic ABC's of resuscitation.    Anatomy and Cardiac Procedures: - Group verbal and visual presentation and models provide information about basic cardiac anatomy and function. Reviews the testing methods done to diagnose heart disease and the outcomes of the test results. Describes the treatment choices: Medical Management, Angioplasty, or Coronary Bypass Surgery for treating various heart conditions including Myocardial Infarction, Angina, Valve Disease, and Cardiac Arrhythmias.  Written material given at graduation.   Medication Safety: - Group verbal and visual instruction to review commonly prescribed medications for heart and lung disease. Reviews the medication, class of the drug, and side effects. Includes the steps to properly store meds and maintain the prescription regimen.  Written material given at graduation.   Other: -Provides group and verbal instruction on various topics (see comments)   Knowledge Questionnaire  Score:  Knowledge Questionnaire Score - 04/24/23 1126       Knowledge Questionnaire Score   Pre Score 15/18              Core Components/Risk Factors/Patient Goals at Admission:  Personal Goals and Risk Factors at Admission - 04/18/23 1451       Core Components/Risk Factors/Patient Goals on Admission    Weight Management Yes    Intervention Weight Management: Develop a combined nutrition and exercise program designed to reach desired caloric intake, while maintaining appropriate intake of nutrient and fiber, sodium and fats, and appropriate energy expenditure required for the weight goal.;Weight Management: Provide education and appropriate  resources to help participant work on and attain dietary goals.    Admit Weight 160 lb (72.6 kg)    Goal Weight: Short Term 159 lb (72.1 kg)    Goal Weight: Long Term 130 lb (59 kg)    Expected Outcomes Short Term: Continue to assess and modify interventions until short term weight is achieved;Long Term: Adherence to nutrition and physical activity/exercise program aimed toward attainment of established weight goal;Weight Loss: Understanding of general recommendations for a balanced deficit meal plan, which promotes 1-2 lb weight loss per week and includes a negative energy balance of [phone] kcal/d    Improve shortness of breath with ADL's Yes    Intervention Provide education, individualized exercise plan and daily activity instruction to help decrease symptoms of SOB with activities of daily living.    Expected Outcomes Short Term: Improve cardiorespiratory fitness to achieve a reduction of symptoms when performing ADLs;Long Term: Be able to perform more ADLs without symptoms or delay the onset of symptoms    Increase knowledge of respiratory medications and ability to use respiratory devices properly  Yes    Intervention Provide education and demonstration as needed of appropriate use of medications, inhalers, and oxygen therapy.    Expected Outcomes Short Term: Achieves understanding of medications use. Understands that oxygen is a medication prescribed by physician. Demonstrates appropriate use of inhaler and oxygen therapy.;Long Term: Maintain appropriate use of medications, inhalers, and oxygen therapy.    Hypertension Yes    Intervention Provide education on lifestyle modifcations including regular physical activity/exercise, weight management, moderate sodium restriction and increased consumption of fresh fruit, vegetables, and low fat dairy, alcohol moderation, and smoking cessation.;Monitor prescription use compliance.    Expected Outcomes Short Term: Continued assessment and intervention  until BP is < 140/45mm HG in hypertensive participants. < 130/70mm HG in hypertensive participants with diabetes, heart failure or chronic kidney disease.;Long Term: Maintenance of blood pressure at goal levels.    Lipids Yes    Intervention Provide education and support for participant on nutrition & aerobic/resistive exercise along with prescribed medications to achieve LDL 70mg , HDL >40mg .    Expected Outcomes Short Term: Participant states understanding of desired cholesterol values and is compliant with medications prescribed. Participant is following exercise prescription and nutrition guidelines.;Long Term: Cholesterol controlled with medications as prescribed, with individualized exercise RX and with personalized nutrition plan. Value goals: LDL < 70mg , HDL > 40 mg.             Education:Diabetes - Individual verbal and written instruction to review signs/symptoms of diabetes, desired ranges of glucose level fasting, after meals and with exercise. Acknowledge that pre and post exercise glucose checks will be done for 3 sessions at entry of program.   Know Your Numbers and Heart Failure: - Group verbal and visual instruction to discuss disease risk  factors for cardiac and pulmonary disease and treatment options.  Reviews associated critical values for Overweight/Obesity, Hypertension, Cholesterol, and Diabetes.  Discusses basics of heart failure: signs/symptoms and treatments.  Introduces Heart Failure Zone chart for action plan for heart failure.  Written material given at graduation.   Core Components/Risk Factors/Patient Goals Review:   Goals and Risk Factor Review     Row Name 05/30/23 0800             Core Components/Risk Factors/Patient Goals Review   Personal Goals Review Improve shortness of breath with ADL's       Review Spoke to patient about their shortness of breath and what they can do to improve. Patient has been informed of breathing techniques when starting the  program. Patient is informed to tell staff if they have had any med changes and that certain meds they are taking or not taking can be causing shortness of breath.       Expected Outcomes Short: Attend [facility] regularly to improve shortness of breath with ADL's. Long: maintain independence with ADL's                Core Components/Risk Factors/Patient Goals at Discharge (Final Review):   Goals and Risk Factor Review - 05/30/23 0800       Core Components/Risk Factors/Patient Goals Review   Personal Goals Review Improve shortness of breath with ADL's    Review Spoke to patient about their shortness of breath and what they can do to improve. Patient has been informed of breathing techniques when starting the program. Patient is informed to tell staff if they have had any med changes and that certain meds they are taking or not taking can be causing shortness of breath.    Expected Outcomes Short: Attend [facility] regularly to improve shortness of breath with ADL's. Long: maintain independence with ADL's             ITP Comments:  ITP Comments     Row Name 04/18/23 1459 04/24/23 1124 04/30/23 0825 05/08/23 0850 06/05/23 1030   ITP Comments Virtual orientation call completed today. shehas an appointment on Date: 04/24/2023  for EP eval and gym Orientation.  Documentation of diagnosis can be found in [facility] 03/08/2023 . Completed and gym orientation for respiratory care services. Initial ITP created and sent for review to Dr. Faud Aleskerov, Medical Director. First full day of exercise!  Patient was oriented to gym and equipment including functions, settings, policies, and procedures.  Patient's individual exercise prescription and treatment plan were reviewed.  All starting workloads were established based on the results of the 6 minute walk test done at initial orientation visit.  The plan for exercise progression was also introduced and progression will be customized based on patient's  performance and goals. 30 Day review completed. Medical Director ITP review done, changes made as directed, and signed approval by Medical Director. New to prgroam 30 Day review completed. Medical Director ITP review done, changes made as directed, and signed approval by Medical Director.            Comments:

## 2023-06-06 ENCOUNTER — Encounter

## 2023-06-10 ENCOUNTER — Ambulatory Visit (INDEPENDENT_AMBULATORY_CARE_PROVIDER_SITE_OTHER): Payer: Medicare Other | Admitting: Dermatology

## 2023-06-10 ENCOUNTER — Encounter: Payer: Self-pay | Admitting: Dermatology

## 2023-06-10 DIAGNOSIS — M793 Panniculitis, unspecified: Secondary | ICD-10-CM

## 2023-06-10 DIAGNOSIS — I872 Venous insufficiency (chronic) (peripheral): Secondary | ICD-10-CM

## 2023-06-10 NOTE — Progress Notes (Signed)
   Follow-Up Visit   Subjective  Teresa Jensen is a 78 y.o. female who presents for the following: Lipodermatosclerosis/stasis dermatitis follow up - pt currently using Tacrolimus  0.1% ointment BID and pentoxifylline  400 mg BID, but she hasn't noticed a difference in condition since starting treatment, pt states that legs don't itch as much and is unsure what the medication is for. Pt tolerating pentoxifylline  BID without diarrhea.  Patient was diagnosed with lung cancer in February and has completed radiation. Recent imaging shows stability to improvement in LUL lesion and plan is for 6 month follow up (01/24/23). Patient scheduled to have blood flow checked in lower legs.   The following portions of the chart were reviewed this encounter and updated as appropriate: medications, allergies, medical history  Review of Systems:  No other skin or systemic complaints except as noted in HPI or Assessment and Plan.  Objective  Well appearing patient in no apparent distress; mood and affect are within normal limits.  A focused examination was performed of the following areas: the legs   Relevant exam findings are noted in the Assessment and Plan.    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 1+ pitting edema with tenderness to pressure   Chronic illness with clinical stability, at goal    Plan: Continue compression socks daily, increase compression pressure as tolerated Continue pentoxifylline  400 mg twice daily, previously reduced from TID due to diarrhea Continue tacrolimus  1-2 times daily as needed Patient will contact us  if worsening before follow up STASIS DERMATITIS OF BOTH LEGS   Related Medications tacrolimus  (PROTOPIC ) 0.1 % ointment Apply to aa's lower legs BID PRN. LIPODERMATOSCLEROSIS OF BOTH LOWER EXTREMITIES    Return in about 3  months (around 09/12/2023) for lipodermatosclerosus/stasis dermatitis follow up/TBSE in September with husband.  Teresa Jensen, CMA, am acting as scribe for Harris Liming, MD .  Documentation: I have reviewed the above documentation for accuracy and completeness, and I agree with the above.  Harris Liming, MD

## 2023-06-10 NOTE — Patient Instructions (Signed)

## 2023-06-11 ENCOUNTER — Encounter

## 2023-06-12 ENCOUNTER — Ambulatory Visit (INDEPENDENT_AMBULATORY_CARE_PROVIDER_SITE_OTHER): Admitting: Pulmonary Disease

## 2023-06-12 ENCOUNTER — Encounter: Payer: Self-pay | Admitting: Pulmonary Disease

## 2023-06-12 VITALS — BP 112/60 | HR 68 | Temp 98.1°F | Ht <= 58 in | Wt 158.8 lb

## 2023-06-12 DIAGNOSIS — J439 Emphysema, unspecified: Secondary | ICD-10-CM | POA: Diagnosis not present

## 2023-06-12 DIAGNOSIS — Z87891 Personal history of nicotine dependence: Secondary | ICD-10-CM

## 2023-06-12 DIAGNOSIS — J449 Chronic obstructive pulmonary disease, unspecified: Secondary | ICD-10-CM

## 2023-06-12 DIAGNOSIS — J9611 Chronic respiratory failure with hypoxia: Secondary | ICD-10-CM

## 2023-06-12 DIAGNOSIS — J8410 Pulmonary fibrosis, unspecified: Secondary | ICD-10-CM

## 2023-06-12 DIAGNOSIS — C3412 Malignant neoplasm of upper lobe, left bronchus or lung: Secondary | ICD-10-CM

## 2023-06-12 NOTE — Progress Notes (Signed)
 Subjective:    Patient ID: Teresa Jensen, female    DOB: February 23, 1945, 78 y.o.   MRN: [id number]  Patient Care Team: Gabriel John, NP as PCP - General (Internal Medicine) End, Veryl Gottron, MD as PCP - Cardiology (Cardiology) Rosan Comfort, MD as Consulting Physician (Neurology) Clair Crews, MD as Referring Physician (Ophthalmology) [facility], [place] , MD (Inactive) as Consulting Physician (Dermatology) Marc Senior, MD as Consulting Physician (Pulmonary Disease)  Chief Complaint  Patient presents with   Follow-up    No cough or wheezing.     BACKGROUND/INTERVAL: Patient is a 78 year old remote former smoker (30 PY) with a history as noted below who presents for follow-up on the issue of stage II COPD, postinflammatory pulmonary fibrosis and squamous cell carcinoma of the lung. Patient follows from her visit of 08 March 2023. This is a scheduled visit.  She has been enrolled in pulmonary rehab.  HPI Discussed the use of AI scribe software for clinical note transcription with the patient, who gave verbal consent to proceed.  History of Present Illness   Teresa Jensen is a 78 year old female with COPD and chronic respiratory failure who presents for follow-up.  She has been experiencing significant soreness in her buttocks and tailbone due to bicycle exercises during pulmonary rehabilitation, which has hindered her ability to continue. She has attempted to alleviate the discomfort with a pillow and a small cushion provided by the rehab staff, but these measures have not been effective.  Her breathing is stable, and she has learned and benefited from breathing techniques during rehabilitation. She is scheduled for a CT scan on July 28th to follow-up on her prior SBRT for squamous cell carcinoma of the lung.   She is compliant with oxygen therapy for her chronic respiratory failure with hypoxia.    DATA 06/2010 PFTs: FEV1 1.20 (56%), FVC 1.77 L or 63% predicted,  FEV1/FVC 68%, significant airtrapping (RV 207%), DLCO 46% but nearly corrects by alveolar volume. 08/30/2021 PFTs: FEV1 0.95 L or 85% predicted, FVC 1.41 L or 71% predicted, FEV1/FVC 67%, normal lung volumes, no significant response.  Moderate diffusion capacity impairment which corrects to alveolar volume. 10/30/2021 CT chest high resolution: 1.5 cm spiculated solid left upper lobe pulmonary nodule suspicious for bronchogenic carcinoma.  No thoracic adenopathy.  Moderate patchy air trapping in both lungs indicative of small airways disease.  Scattered thin parenchymal bands mild patchy subpleural reticulation without bronchiectasis or honeycombing.  Mostly compatible with postinfectious/postinflammatory scarring.  Bilateral pleural thickening without pleural effusions. 11/21/2021 PET/CT: Hypermetabolic left upper lobe pulmonary nodule.  Mildly metabolic pericarinal lymph node, favored to be reactive.  No hypermetabolic left hilar lymph nodes.  No evidence of hypermetabolic distant metastatic disease. 01/12/2022 [facility] protocol CT: Slightly increased left upper lobe pulmonary nodule previously noted to be hypermetabolic.  Stable mild subcarinal lymphadenopathy, nonspecific.  Stable left adrenal adenoma. 01/17/2022 robotic bronchoscopy + EBUS: Left upper lobe nodule positive for squamous cell carcinoma.  TBNA negative for malignancy on subcarinal nodes with sufficient lymph node material consistent with sampling of lymph node. 05/07/2022: Ambulatory oximetry: Patient ambulated 250 feet and desaturated to 82%.  Placed on 2 L/min continue to desaturate to 87%.  Required 4 L/min to maintain 90 to 91% O2 sats. 08/09/2022 PFTs: FEV1 0.79 L or 50% predicted, FVC 1.34 L or 62% predicted, FEV1/FVC 59%, lung volumes normal with mild to moderate air trapping evident.  There is severe diffusion defect.  Significant bronchodilator response.  Related to severe obstructive  airways disease.  Study performed in 2023 there  has been modest decline in function. 08/19/2022 overnight oximetry on oxygen at 3 L/min: Showing desaturation events 45 with ODI of 15 O2 nadir 87%, instructed to increase oxygen to 3-1/2 L/min during sleep. 09/07/2022 echocardiogram: LVEF 55 to 60%, wall motion abnormalities, determinate diastolic parameters, normal right ventricular function.  Mildly elevated pulmonary artery systolic pressure.  Left atrial size mildly dilated.  Mild mitral valve regurgitation.  Mild aortic valve stenosis. 01/15/2023 CT chest without contrast: Previously treated lesion anteriorly in the left upper lobe has decreased in size.  No evidence of local recurrence or metastatic disease.  New patchy airspace disease more inferiorly in the lingula likely infectious/inflammatory.  No suspicious pulmonary nodularity.  Stable mildly enlarged mediastinal lymph nodes, likely reactive.   Review of Systems A 10 point review of systems was performed and it is as noted above otherwise negative.   Patient Active Problem List   Diagnosis Date Noted   Candidal intertrigo 03/21/2023   Urinary urgency 11/21/2022   Acute on chronic diastolic (congestive) heart failure (HCC) 09/06/2022   Allergic reaction to contrast dye 07/17/2022   Intermittent claudication (HCC) 07/04/2022   Lipodermatosclerosis of both lower extremities 02/22/2022   Solitary pulmonary nodule 11/27/2021   Chronic right shoulder pain 08/04/2021   Lower extremity pain 08/04/2021   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   Morbid obesity (HCC) 05/01/2021   Foot callus 09/01/2020   Rash and nonspecific skin eruption 07/07/2020   History of colonic polyps 11/01/2019   Bilateral carpal tunnel syndrome 08/16/2019   Chronic heart failure with preserved ejection fraction (HFpEF) (HCC) 11/12/2018   DOE (dyspnea on exertion) 11/12/2018   Osteoporosis 12/16/2017   Low vitamin B12 level 01/13/2017   Hypokalemia 01/12/2017   Essential hypertension  01/08/2017   Closed fracture of metatarsal bone 09/28/2016   Prediabetes 10/17/2015   Pain of right lower extremity 05/20/2015   Medicare annual wellness visit, subsequent 10/12/2014   Occipital neuralgia 10/12/2014   Vitamin D  deficiency 10/12/2014   Erythema of lower extremity 09/08/2013   COPD GOLD 2  06/05/2010   Hypothyroidism 09/29/2009   Situational anxiety 10/01/2008   COPD 10/01/2008   GERD 10/01/2008   Osteoarthritis 10/01/2008    Social History   Tobacco Use   Smoking status: Former    Current packs/day: 0.00    Average packs/day: 1.5 packs/day for 20.0 years (30.0 ttl pk-yrs)    Types: Cigarettes    Start date: 02/11/1989    Quit date: 02/11/2009    Years since quitting: 14.3   Smokeless tobacco: Never  Substance Use Topics   Alcohol use: No    Alcohol/week: 0.0 standard drinks of alcohol    Allergies  Allergen Reactions   Epinephrine  Anaphylaxis and Shortness Of Breath   Food Rash    MANGO = cause rashes around the mouth LIPS GO NUMB   Mang[employer] Rash    THIS IS MANGO LIPS GO NUMB   Other Rash    MANGO = cause rashes around the mouth MANGO = cause rashes around the mouth LIPS GO NUMB   Prednisone  Anaphylaxis, Hives, Shortness Of Breath, Itching and Swelling    The face swells HAS TAKEN CORTISONE SHOTS WITHOUT ISSUE BEFORE   Ivp Dye [Iodinated Contrast Media] Hives   Lactose Intolerance (Gi) Diarrhea   Pneumococcal 13-Val Conj Vacc Itching and Other (See Comments)    Arm temp elevated at site of injection, red, itching   Prevnar 13 [Pneumococcal 13-Val  Conj Vacc] Other (See Comments)    Arm temp elevated at site of injection, red, itching    Current Meds  Medication Sig   alendronate  (FOSAMAX ) 70 MG tablet Take 70 mg by mouth once a week. Take with a full glass of water on an empty stomach.   atorvastatin  (LIPITOR) 10 MG tablet TAKE 1 TABLET BY MOUTH DAILY. FOR CHOLESTEROL   Calcium  Carb-Cholecalciferol (CALCIUM  + VITAMIN D3 PO) Take 1,200  mg by mouth daily.   dapagliflozin propanediol  (FARXIGA ) 10 MG TABS tablet TAKE 1 TABLET BY MOUTH DAILY BEFORE BREAKFAST.   famotidine  (PEPCID ) 20 MG tablet TAKE 1 TABLET BY MOUTH AFTER SUPPER   furosemide  (LASIX ) 40 MG tablet Take 1 tablet (40 mg total) by mouth daily.   Glycopyrrolate -Formoterol  (BEVESPI  AEROSPHERE) 9-4.8 MCG/ACT AERO Inhale 2 puffs into the lungs 2 (two) times daily.   levothyroxine  (SYNTHROID ) 75 MCG tablet TAKE 1 EVERY AM ON AN EMPTY STOMACH WITH WATER ONLY. NO FOOD OR OTHER MEDICATIONS FOR 30 MINUTES.   losartan  (COZAAR ) 100 MG tablet TAKE 1 TABLET (100 MG TOTAL) BY MOUTH DAILY. PLEASE CALL [phone] TO SCHEDULE A YEARLY APPOINTMENT. THANK YOU.   Multiple Vitamins-Minerals (PRESERVISION AREDS 2 PO) Take 1 tablet by mouth in the morning and at bedtime.   pantoprazole  (PROTONIX ) 40 MG tablet Take 1 tablet (40 mg total) by mouth daily. for heartburn.   pentoxifylline  (TRENTAL ) 400 MG CR tablet Take 1 tablet (400 mg total) by mouth in the morning and at bedtime.   spironolactone  (ALDACTONE ) 25 MG tablet Take 0.5 tablets (12.5 mg total) by mouth daily.   tacrolimus  (PROTOPIC ) 0.1 % ointment Apply to aa's lower legs BID PRN.    Immunization History  Administered Date(s) Administered   Fluad Quad(high Dose 65+) 09/02/2020, 10/12/2021   Fluad Trivalent(High Dose 65+) 10/19/2022   Influenza Whole 10/01/2008   Influenza,inj,Quad PF,6+ Mos 10/12/2014, 10/13/2015, 10/17/2016, 10/17/2017, 10/24/2018, 10/27/2019   PFIZER(Purple Top)SARS-COV-2 Vaccination 03/11/2019, 04/01/2019   Pneumococcal Conjugate-13 10/12/2014   Td 10/01/2008        Objective:     BP 112/60 (BP Location: Left Arm, Patient Position: Sitting, Cuff Size: Normal)   Pulse 68   Temp 98.1 F (36.7 C) (Oral)   Ht 4' 9 (1.448 m)   Wt 158 lb 12.8 oz (72 kg)   SpO2 94% Comment: 1L continuous oxygen  BMI 34.36 kg/m   SpO2: 94 % (1L continuous oxygen) O2 Device: Nasal cannula O2 Flow Rate (L/min): 1  L/min O2 Type: Continuous O2  GENERAL: Obese woman, no acute distress.  Fully ambulatory, no conversational dyspnea. HEAD: Normocephalic, atraumatic.  EYES: Pupils equal, round, reactive to light.  No scleral icterus.  MOUTH: Dentition intact, oral mucosa moist. NECK: Supple. No thyromegaly. Trachea midline. No JVD.  No adenopathy. PULMONARY: Good air entry bilaterally.  Coarse, otherwise, no adventitious sounds. CARDIOVASCULAR: S1 and S2. Regular rate and rhythm.  Grade 1/6 systolic ejection murmur LSB, no gallops or rubs noted. ABDOMEN: Obese, otherwise benign. MUSCULOSKELETAL: No joint deformity, no clubbing, no edema.  NEUROLOGIC: No overt focal deficit, no gait disturbance, speech is fluent. SKIN: Intact,warm,dry. PSYCH: Mood and behavior normal.          Assessment & Plan:     ICD-10-CM   1. Stage 2 moderate COPD by GOLD classification (HCC)  J44.9     2. Combined pulmonary fibrosis and emphysema (CPFE) (HCC)  J43.9    J84.10     3. Chronic respiratory failure with hypoxia (HCC)  J96.11  4. 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 She is experiencing soreness in the buttocks and tailbone, likely due to the seating arrangement during pulmonary rehabilitation exercises. Breathing techniques taught in rehabilitation are reportedly helpful. - Advise her to inform pulmonary rehabilitation staff about discomfort and to focus on upper body exercises instead of using the bicycle. - Continue use of Breztri  2 puffs twice a day as well as use of as needed albuterol  - Continue oxygen therapy as is  Squamous cell carcinoma of the left upper lobe Squamous cell carcinoma of the left upper lobe, status post stereotactic body radiation therapy (SBRT). A follow-up CT scan is scheduled for July 28th to monitor for  any recurrence or progression. - Ensure follow-up CT scan is completed on July 28th.       Advised if symptoms do not improve or worsen, to please contact office for sooner follow up or seek emergency care.    I spent 33 minutes of dedicated to the care of this patient on the date of this encounter to include pre-visit review of records, face-to-face time with the patient discussing conditions above, post visit ordering of testing, clinical documentation with the electronic health record, making appropriate referrals as documented, and communicating necessary findings to members of the patients care team.     C. Chloe Counter, MD Advanced Bronchoscopy PCCM [facility]    *This note was generated using voice recognition software/Dragon and/or AI transcription program.  Despite best efforts to proofread, errors can occur which can change the meaning. Any transcriptional errors that result from this process are unintentional and may not be fully corrected at the time of dictation.

## 2023-06-12 NOTE — Patient Instructions (Signed)
 VISIT SUMMARY:  Today, we discussed your chronic respiratory failure due to COPD and the soreness you are experiencing in your buttocks and tailbone from bicycle exercises during pulmonary rehabilitation. We also reviewed your history of squamous cell carcinoma of the left upper lobe and your upcoming CT scan.  YOUR PLAN:  -CHRONIC RESPIRATORY FAILURE DUE TO COPD: Chronic respiratory failure is a condition where your lungs cannot get enough oxygen into your blood or remove enough carbon dioxide from it. This is often due to COPD, which is a long-term lung disease that makes it hard to breathe. You are experiencing soreness in your buttocks and tailbone likely due to the seating arrangement during pulmonary rehabilitation exercises. Please inform the pulmonary rehabilitation staff about your discomfort and focus on upper body exercises instead of using the bicycle.  If the issues persist with the soreness please consult with your primary care provider.  -SQUAMOUS CELL CARCINOMA OF THE LEFT UPPER LOBE: Squamous cell carcinoma is a type of lung cancer that affects the cells lining the airways. You have previously undergone stereotactic body radiation therapy (SBRT) for this condition. A follow-up CT scan is scheduled for July 28th to monitor for any recurrence or progression. Please ensure that you complete this follow-up CT scan.  INSTRUCTIONS:  Please inform the pulmonary rehabilitation staff about your discomfort and focus on upper body exercises instead of using the bicycle. Also, ensure that you complete your follow-up CT scan on July 28th to monitor your lung condition.

## 2023-06-13 ENCOUNTER — Encounter: Admitting: *Deleted

## 2023-06-13 DIAGNOSIS — J8410 Pulmonary fibrosis, unspecified: Secondary | ICD-10-CM

## 2023-06-13 DIAGNOSIS — J4489 Other specified chronic obstructive pulmonary disease: Secondary | ICD-10-CM | POA: Diagnosis not present

## 2023-06-13 NOTE — Progress Notes (Signed)
 Daily Session Note  Patient Details  Name: Teresa Jensen MRN: [id number] Date of Birth: 07-04-1945 Referring Provider:   Gattis [facility] from 04/24/2023 in [facility]  Referring Provider Dr. Coralie Derrick    Encounter Date: 06/13/2023  Check In:  Session Check In - 06/13/23 0749       Check-In   Supervising physician immediately available to respond to emergencies See telemetry face sheet for immediately available ER MD    Location [facility]    Staff Present Maud Sorenson, RN, BSN, CCRP;Noah Tickle, BS, Exercise Physiologist;Margaret Best, MS, Exercise Physiologist;Jason Martina Sledge RDN,LDN    Virtual Visit No    Medication changes reported     No    Fall or balance concerns reported    No    Warm-up and Cool-down Performed on first and last piece of equipment    Resistance Training Performed Yes    VAD Patient? No    PAD/SET Patient? No      Pain Assessment   Currently in Pain? No/denies             Social History   Tobacco Use  Smoking Status Former   Current packs/day: 0.00   Average packs/day: 1.5 packs/day for 20.0 years (30.0 ttl pk-yrs)   Types: Cigarettes   Start date: 02/11/1989   Quit date: 02/11/2009   Years since quitting: 14.3  Smokeless Tobacco Never    Goals Met:  Proper associated with RPD/PD & O2 Sat [place] with exercise equipment Exercise tolerated well No report of concerns or symptoms today  Goals Unmet:  Not Applicable  Comments: Pt able to follow exercise prescription today without complaint.  Will continue to monitor for progression.    Dr. Firman Hughes is Medical Director for [facility].  Dr. Fuad Aleskerov is Medical Director for [facility] Pulmonary Rehabilitation.

## 2023-06-14 ENCOUNTER — Encounter

## 2023-06-18 ENCOUNTER — Encounter

## 2023-06-20 ENCOUNTER — Encounter

## 2023-06-21 ENCOUNTER — Encounter: Payer: Self-pay | Admitting: Pulmonary Disease

## 2023-06-21 ENCOUNTER — Encounter: Payer: Self-pay | Admitting: Primary Care

## 2023-06-21 ENCOUNTER — Ambulatory Visit (INDEPENDENT_AMBULATORY_CARE_PROVIDER_SITE_OTHER): Admitting: Primary Care

## 2023-06-21 VITALS — BP 122/80 | HR 91 | Temp 97.2°F | Ht <= 58 in | Wt 159.0 lb

## 2023-06-21 DIAGNOSIS — R238 Other skin changes: Secondary | ICD-10-CM | POA: Diagnosis not present

## 2023-06-21 NOTE — Assessment & Plan Note (Signed)
 Noted to the gluteal lower folds on exam today which is the site for her symptoms.  We discussed methods for healing including keeping the area clean and dry. We discussed zinc oxide for treatment and protection. We discussed Goldbond powder in the future to keep the site dry.  Recommend that she try the donut pillow again and try to relieve pressure from the buttocks.  She will update if no improvement. Consider antifungal agents at that point.

## 2023-06-21 NOTE — Patient Instructions (Addendum)
 Keep the area clean and dry.   Use Gold Bond Powder to keep the area dry from sweat OR you can try Zinc Oxide ointment for healing and protection.   You can find this over the counter at any drug store, [employer], [employer], etc.  Try to relieve pressure from the site.  Try the donut pillow again.  Please notify me if you do not notice improvement within a few weeks.  It was a pleasure to see you today!

## 2023-06-21 NOTE — Progress Notes (Signed)
 Subjective:    Patient ID: Teresa Jensen, female    DOB: [birth year], 78 y.o.   MRN: [id number]  Muscle Pain Pertinent negatives include no fever.    Teresa Jensen is a very pleasant 78 y.o. female with a history of hypertension, CHF, lung cancer, hypothyroidism, COPD, carpal tunnel syndrome, osteoarthritis, osteoporosis who presents today to discuss buttock pain.   Currently following with pulmonary rehab for physical therapy.  Last visit was 06/13/2023. During her PT sessions she's been using the recumbent bicycle, since then she's noticed bilateral buttock pain to the skin at the gluteal folds. Her pain is worse with sitting, improved with laying down or relieving pressure from her buttocks. She describes her pain as intense soreness and burning.   She is active at home during the day at home. She's been provided several pillows to try, and she's also tried a donut pillow without improvement. She has sat on a heating pad which helps.    Review of Systems  Constitutional:  Negative for fever.  Skin:        Burning pain to the skin of buttocks         Past Medical History:  Diagnosis Date   (HFpEF) heart failure with preserved ejection fraction (HCC) 06/28/2011   a.) TTE 06/28/2011: EF 55-60%, mild AR, G1DD; b.) TTE 01/16/2013: EF 55-60%, mild MR; c.) TTE 02/16/2018: EF 60-65%, G1DD; d.) MPI 11/26/2018: EF 56%   Abnormal electrocardiogram 11/12/2018   Acute kidney failure (HCC) 01/12/2017   Acute on chronic respiratory failure with hypoxia (HCC) 09/06/2022   Adenoma of left adrenal gland    Anemia    Anxiety    Aortic atherosclerosis (HCC)    Bilateral carpal tunnel syndrome    CAD (coronary artery disease)    a.) HR CT chest 10/30/2021: 2 vessel CAD (LAD/RCA)   CAP (community acquired pneumonia)    COPD (chronic obstructive pulmonary disease) (HCC)    COPD exacerbation (HCC) 01/21/2018   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   Diverticulosis    Dyspnea    DUE TO  HEAT WITH COPD   Epistaxis 01/09/2017   GERD (gastroesophageal reflux disease)    Herpes zoster 07/08/2015   Hypertension    Hypothyroidism    Iatrogenic hypotension 01/12/2017   Iliac artery stenosis, bilateral (HCC)    Morbid obesity with BMI of 40.0-44.9, adult (HCC)    New onset of headaches    Osteopenia    PMR (polymyalgia rheumatica) (HCC)    Pneumonia 02/13/2018   PONV (postoperative nausea and vomiting)    Pulmonary nodule, left    a.) CT chest 10/30/2021: 1.5 cm medial LUL nodule; b.) PET CT 11/21/2021: hypermetabolic LUL nodule (SUX max 9.3)   S/P carpal tunnel release 08/04/2020   Shingles    Stasis dermatitis    Strain of chest wall 01/20/2011   Tobacco abuse    Vitamin D  deficiency     Social History   Socioeconomic History   Marital status: Married    Spouse name: [name]   Number of children: 1   Years of education: Not on file   Highest education level: 12th grade  Occupational History   Occupation: Catering manager as a Paramedic: RETIRED    Comment: retired  Tobacco Use   Smoking status: Former    Current packs/day: 0.00    Average packs/day: 1.5 packs/day for 20.0 years (30.0 ttl pk-yrs)    Types: Cigarettes    Start date:  02/11/1989    Quit date: 02/11/2009    Years since quitting: 14.3   Smokeless tobacco: Never  Vaping Use   Vaping status: Never Used  Substance and Sexual Activity   Alcohol use: No    Alcohol/week: 0.0 standard drinks of alcohol   Drug use: No   Sexual activity: Not Currently  Other Topics Concern   Not on file  Social History Narrative   Lives in [place] with her husband.  Takes care of her two grandchildren, ages 82 and 2, everyday in [place].  Has one son who is 9.   Desires CPR   Would not want prolonged life support if futile   Social Drivers of Health   Financial Resource Strain: Patient Declined (05/01/2023)   Overall Financial Resource Strain (CARDIA)    Difficulty of Paying Living Expenses:  Patient declined  Food Insecurity: Patient Declined (05/01/2023)   Hunger Vital Sign    Worried About Running Out of Food in the Last Year: Patient declined    Ran Out of Food in the Last Year: Patient declined  Transportation Needs: Patient Declined (05/01/2023)   PRAPARE - Administrator, Civil Service (Medical): Patient declined    Lack of Transportation (Non-Medical): Patient declined  Physical Activity: Sufficiently Active (05/01/2023)   Exercise Vital Sign    Days of Exercise per Week: 3 days    Minutes of Exercise per Session: 60 min  Stress: No Stress Concern Present (05/01/2023)   [employer] of Occupational Health - Occupational Stress Questionnaire    Feeling of Stress : Only a little  Social Connections: Unknown (05/01/2023)   Social Connection and Isolation Panel    Frequency of Communication with Friends and Family: More than three times a week    Frequency of Social Gatherings with Friends and Family: More than three times a week    Attends Religious Services: Patient declined    Active Member of Clubs or Organizations: Patient declined    Attends Banker Meetings: Not on file    Marital Status: Married  Intimate Partner Violence: Not At Risk (05/08/2023)   Humiliation, Afraid, Rape, and Kick questionnaire    Fear of Current or Ex-Partner: No    Emotionally Abused: No    Physically Abused: No    Sexually Abused: No    Past Surgical History:  Procedure Laterality Date   ANTERIOR CERVICAL DECOMP/DISCECTOMY FUSION  06/22/2004   C4-C6   APPENDECTOMY     CARPAL TUNNEL RELEASE Left 08/31/2019   Procedure: CARPAL TUNNEL RELEASE;  Surgeon: Arlyne Lame, MD;  Location: [facility];  Service: Orthopedics;  Laterality: Left;   CARPAL TUNNEL RELEASE Right 06/22/2020   Procedure: CARPAL TUNNEL RELEASE;  Surgeon: Arlyne Lame, MD;  Location: [facility];  Service: Orthopedics;  Laterality: Right;   CHOLECYSTECTOMY     COLONOSCOPY     ENDOBRONCHIAL  ULTRASOUND Left 01/17/2022   Procedure: ENDOBRONCHIAL ULTRASOUND;  Surgeon: Marc Senior, MD;  Location: [facility];  Service: Pulmonary;  Laterality: Left;   ESOPHAGOGASTRODUODENOSCOPY     HEMORRHOID SURGERY     TOTAL ABDOMINAL HYSTERECTOMY W/ BILATERAL SALPINGOOPHORECTOMY     TUBAL LIGATION      Family History  Problem Relation Age of Onset   Heart attack Mother 58   Cancer Father        lung/chest wall    Prostate cancer Father    Diabetes Brother        siblings   Hypertension Sister  x 2   Heart disease Brother    Breast cancer Paternal Grandmother    Heart disease Sister    Diabetes Sister    Tuberculosis Sister    Cirrhosis Brother     Allergies  Allergen Reactions   Epinephrine  Anaphylaxis and Shortness Of Breath   Food Rash    MANGO = cause rashes around the mouth LIPS GO NUMB   [employer] Rash    THIS IS MANGO LIPS GO NUMB   Other Rash    MANGO = cause rashes around the mouth MANGO = cause rashes around the mouth LIPS GO NUMB   Prednisone  Anaphylaxis, Hives, Shortness Of Breath, Itching and Swelling    The face swells HAS TAKEN CORTISONE SHOTS WITHOUT ISSUE BEFORE   Ivp Dye [Iodinated Contrast Media] Hives   Lactose Intolerance (Gi) Diarrhea   Pneumococcal 13-Val Conj Vacc Itching and Other (See Comments)    Arm temp elevated at site of injection, red, itching   Prevnar 13 [Pneumococcal 13-Val Conj Vacc] Other (See Comments)    Arm temp elevated at site of injection, red, itching    Current Outpatient Medications on File Prior to Visit  Medication Sig Dispense Refill   alendronate  (FOSAMAX ) 70 MG tablet Take 70 mg by mouth once a week. Take with a full glass of water on an empty stomach.     atorvastatin  (LIPITOR) 10 MG tablet TAKE 1 TABLET BY MOUTH DAILY. FOR CHOLESTEROL 90 tablet 1   Calcium  Carb-Cholecalciferol (CALCIUM  + VITAMIN D3 PO) Take 1,200 mg by mouth daily.     dapagliflozin propanediol  (FARXIGA ) 10 MG TABS tablet TAKE 1  TABLET BY MOUTH DAILY BEFORE BREAKFAST. 90 tablet 3   famotidine  (PEPCID ) 20 MG tablet TAKE 1 TABLET BY MOUTH AFTER SUPPER 90 tablet 3   furosemide  (LASIX ) 40 MG tablet Take 1 tablet (40 mg total) by mouth daily. 30 tablet 0   Glycopyrrolate -Formoterol  (BEVESPI  AEROSPHERE) 9-4.8 MCG/ACT AERO Inhale 2 puffs into the lungs 2 (two) times daily. 3 each 3   levothyroxine  (SYNTHROID ) 75 MCG tablet TAKE 1 EVERY AM ON AN EMPTY STOMACH WITH WATER ONLY. NO FOOD OR OTHER MEDICATIONS FOR 30 MINUTES. 90 tablet 1   losartan  (COZAAR ) 100 MG tablet TAKE 1 TABLET (100 MG TOTAL) BY MOUTH DAILY. PLEASE CALL [phone] TO SCHEDULE A YEARLY APPOINTMENT. THANK YOU. 90 tablet 3   Multiple Vitamins-Minerals (PRESERVISION AREDS 2 PO) Take 1 tablet by mouth in the morning and at bedtime.     pantoprazole  (PROTONIX ) 40 MG tablet Take 1 tablet (40 mg total) by mouth daily. for heartburn. 90 tablet 3   pentoxifylline  (TRENTAL ) 400 MG CR tablet Take 1 tablet (400 mg total) by mouth in the morning and at bedtime. 180 tablet 1   spironolactone  (ALDACTONE ) 25 MG tablet Take 0.5 tablets (12.5 mg total) by mouth daily. 45 tablet 3   tacrolimus  (PROTOPIC ) 0.1 % ointment Apply to aa's lower legs BID PRN. 60 g 5   No current facility-administered medications on file prior to visit.    BP 122/80   Pulse 91   Temp (!) 97.2 F (36.2 C) (Temporal)   Ht 4' 9 (1.448 m)   Wt 159 lb (72.1 kg)   SpO2 94% Comment: 1 L of O2  BMI 34.41 kg/m  Objective:   Physical Exam  Cardiovascular:     Rate and Rhythm: Normal rate.  Pulmonary:     Effort: Pulmonary effort is normal.     Breath sounds: Normal breath  sounds.   Skin:    General: Skin is warm and dry.     Findings: Erythema present.     Comments: Mild skin breakdown with mild erythema to lower gluteal folds           Assessment & Plan:  Skin breakdown Assessment & Plan: Noted to the gluteal lower folds on exam today which is the site for her symptoms.  We  discussed methods for healing including keeping the area clean and dry. We discussed zinc oxide for treatment and protection. We discussed Goldbond powder in the future to keep the site dry.  Recommend that she try the donut pillow again and try to relieve pressure from the buttocks.  She will update if no improvement. Consider antifungal agents at that point.          Aron Inge K Tashaun Obey, NP

## 2023-06-25 ENCOUNTER — Encounter

## 2023-06-26 NOTE — Telephone Encounter (Signed)
 She can stop pulmonary rehab she can let them now.  If her issues continue with the buttocks she needs to continue to follow-up with her primary doctor.

## 2023-06-26 NOTE — Telephone Encounter (Signed)
 Copied from CRM [id number]. Topic: General - Other >> Jun 26, 2023 10:52 AM Celestine FALCON wrote: Reason for CRM: Pt stated she sent a mychart message to her provider Dr. Tamea on 6/20.  Went to see [facility] Primary Physician today.  Telling her about the buttocks, I have a break down in skin .   Don't want a bedsore.   So do I continue therapy or can I do the upper body therapy at home.   I have weights at home.    Physical therapy has put pillows and cushion on the bike for me to sit on,  but it hasn't help with the soreness. Thanks for your time.   Have a wonderful week-end.   Myrtha  Pt would like to hear from Dr. Tamea, and if the pt should continue doing the pulm rehab or do the upper body therapy at her home. Please call the pt back at [phone] ok to leave a vm.

## 2023-06-27 ENCOUNTER — Encounter

## 2023-07-01 ENCOUNTER — Encounter: Payer: Self-pay | Admitting: *Deleted

## 2023-07-01 DIAGNOSIS — J4489 Other specified chronic obstructive pulmonary disease: Secondary | ICD-10-CM

## 2023-07-01 NOTE — Progress Notes (Signed)
 Early Discharge Summary   Teresa Jensen DOB: 2045/03/13   [name] informed staff she would like to be discharged at this time. She has completed 12 of 36 sessions.     6 Minute Walk     Row Name 04/24/23 1137         6 Minute Walk   Phase Initial     Distance 615 feet     Walk Time 4.73 minutes     # of Rest Breaks 3     MPH 1.48     METS 1.18     RPE 13     Perceived Dyspnea  2     VO2 Peak 4.13     Symptoms No     Resting HR 77 bpm     Resting BP 122/64     Resting Oxygen Saturation  95 %     Exercise Oxygen Saturation  during 6 min walk 90 %     Max Ex. HR 119 bpm     Max Ex. BP 142/66     2 Minute Post BP 120/62       Interval HR   1 Minute HR 105     2 Minute HR 111     3 Minute HR 119     4 Minute HR 112     5 Minute HR 115     6 Minute HR 119     2 Minute Post HR 79     Interval Heart Rate? Yes       Interval Oxygen   Interval Oxygen? Yes     Baseline Oxygen Saturation % 95 %     1 Minute Oxygen Saturation % 93 %     1 Minute Liters of Oxygen 1.5 L     2 Minute Oxygen Saturation % 91 %     2 Minute Liters of Oxygen 1.5 L     3 Minute Oxygen Saturation % 90 %     3 Minute Liters of Oxygen 1.5 L     4 Minute Oxygen Saturation % 92 %     4 Minute Liters of Oxygen 1.5 L     5 Minute Oxygen Saturation % 91 %     5 Minute Liters of Oxygen 1.5 L     6 Minute Oxygen Saturation % 91 %     6 Minute Liters of Oxygen 1.5 L     2 Minute Post Oxygen Saturation % 95 %     2 Minute Post Liters of Oxygen 1.5 L

## 2023-07-01 NOTE — Progress Notes (Signed)
 Pulmonary Individual Treatment Plan  Patient Details  Name: Teresa Jensen MRN: [id number] Date of Birth: 09-Jul-1945 Referring Provider:   [facility] Pulmonary Rehab from 04/24/2023 in [facility]  Referring Provider Dr. Dedra Sanders    Initial Encounter Date:  Flowsheet Row Pulmonary Rehab from 04/24/2023 in [facility] Card[facility] and Pulmonary Rehab  Date 04/24/23    Visit Diagnosis: COPD  Patient's Home Medications on Admission:  Current Outpatient Medications:    alendronate  (FOSAMAX ) 70 MG tablet, Take 70 mg by mouth once a week. Take with a full glass of water on an empty stomach., Disp: , Rfl:    atorvastatin  (LIPITOR) 10 MG tablet, TAKE 1 TABLET BY MOUTH DAILY. FOR CHOLESTEROL, Disp: 90 tablet, Rfl: 1   Calcium  Carb-Cholecalciferol (CALCIUM  + VITAMIN D3 PO), Take 1,200 mg by mouth daily., Disp: , Rfl:    dapagliflozin propanediol  (FARXIGA ) 10 MG TABS tablet, TAKE 1 TABLET BY MOUTH DAILY BEFORE BREAKFAST., Disp: 90 tablet, Rfl: 3   famotidine  (PEPCID ) 20 MG tablet, TAKE 1 TABLET BY MOUTH AFTER SUPPER, Disp: 90 tablet, Rfl: 3   furosemide  (LASIX ) 40 MG tablet, Take 1 tablet (40 mg total) by mouth daily., Disp: 30 tablet, Rfl: 0   Glycopyrrolate -Formoterol  (BEVESPI  AEROSPHERE) 9-4.8 MCG/ACT AERO, Inhale 2 puffs into the lungs 2 (two) times daily., Disp: 3 each, Rfl: 3   levothyroxine  (SYNTHROID ) 75 MCG tablet, TAKE 1 EVERY AM ON AN EMPTY STOMACH WITH WATER ONLY. NO FOOD OR OTHER MEDICATIONS FOR 30 MINUTES., Disp: 90 tablet, Rfl: 1   losartan  (COZAAR ) 100 MG tablet, TAKE 1 TABLET (100 MG TOTAL) BY MOUTH DAILY. PLEASE CALL [phone] TO SCHEDULE A YEARLY APPOINTMENT. THANK YOU., Disp: 90 tablet, Rfl: 3   Multiple Vitamins-Minerals (PRESERVISION AREDS 2 PO), Take 1 tablet by mouth in the morning and at bedtime., Disp: , Rfl:    pantoprazole  (PROTONIX ) 40 MG tablet, Take 1 tablet (40 mg total) by mouth daily. for heartburn., Disp: 90 tablet, Rfl: 3   pentoxifylline   (TRENTAL ) 400 MG CR tablet, Take 1 tablet (400 mg total) by mouth in the morning and at bedtime., Disp: 180 tablet, Rfl: 1   spironolactone  (ALDACTONE ) 25 MG tablet, Take 0.5 tablets (12.5 mg total) by mouth daily., Disp: 45 tablet, Rfl: 3   tacrolimus  (PROTOPIC ) 0.1 % ointment, Apply to aa's lower legs BID PRN., Disp: 60 g, Rfl: 5  Past Medical History: Past Medical History:  Diagnosis Date   (HFpEF) heart failure with preserved ejection fraction (HCC) 06/28/2011   a.) TTE 06/28/2011: EF 55-60%, mild AR, G1DD; b.) TTE 01/16/2013: EF 55-60%, mild MR; c.) TTE 02/16/2018: EF 60-65%, G1DD; d.) MPI 11/26/2018: EF 56%   Abnormal electrocardiogram 11/12/2018   Acute kidney failure (HCC) 01/12/2017   Acute on chronic respiratory failure with hypoxia (HCC) 09/06/2022   Adenoma of left adrenal gland    Anemia    Anxiety    Aortic atherosclerosis (HCC)    Bilateral carpal tunnel syndrome    CAD (coronary artery disease)    a.) HR CT chest 10/30/2021: 2 vessel CAD (LAD/RCA)   CAP (community acquired pneumonia)    COPD (chronic obstructive pulmonary disease) (HCC)    COPD exacerbation (HCC) 01/21/2018   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   Diverticulosis    Dyspnea    DUE TO HEAT WITH COPD   Epistaxis 01/09/2017   GERD (gastroesophageal reflux disease)    Herpes zoster 07/08/2015   Hypertension    Hypothyroidism    Iatrogenic hypotension  01/12/2017   Iliac artery stenosis, bilateral (HCC)    Morbid obesity with BMI of 40.0-44.9, adult (HCC)    New onset of headaches    Osteopenia    PMR (polymyalgia rheumatica) (HCC)    Pneumonia 02/13/2018   PONV (postoperative nausea and vomiting)    Pulmonary nodule, left    a.) CT chest 10/30/2021: 1.5 cm medial LUL nodule; b.) PET CT 11/21/2021: hypermetabolic LUL nodule (SUX max 9.3)   S/P carpal tunnel release 08/04/2020   Shingles    Stasis dermatitis    Strain of chest wall 01/20/2011   Tobacco abuse    Vitamin D  deficiency      Tobacco Use: Social History   Tobacco Use  Smoking Status Former   Current packs/day: 0.00   Average packs/day: 1.5 packs/day for 20.0 years (30.0 ttl pk-yrs)   Types: Cigarettes   Start date: 02/11/1989   Quit date: 02/11/2009   Years since quitting: 14.3  Smokeless Tobacco Never    Labs: Review Flowsheet  More data exists      Latest Ref Rng & Units 10/24/2018 10/27/2019 02/28/2021 02/22/2022 05/30/2023  Labs for ITP Cardiac and Pulmonary Rehab  Cholestrol 0 - 200 mg/dL 832  857  858  858  875   LDL (calc) 0 - 99 mg/dL 95  82  66  59  50   HDL-C >40 mg/dL 56.09  59.29  47.59  48.19  60   Trlycerides <150 mg/dL 856.9  00.9  885.9  850.9  71   Hemoglobin A1c 4.6 - 6.5 % 6.1  6.4  5.3  5.5  -     Pulmonary Assessment Scores:  Pulmonary Assessment Scores     Row Name 04/24/23 1129         ADL UCSD   ADL Phase Entry     SOB Score total 55     Rest 3     Walk 3     Stairs 4     Bath 2     Dress 2     Shop 2       CAT Score   CAT Score 21       mMRC Score   mMRC Score 2        UCSD: Self-administered rating of dyspnea associated with activities of daily living (ADLs) 6-point scale (0 = not at all to 5 = maximal or unable to do because of breathlessness)  Scoring Scores range from 0 to 120.  Minimally important difference is 5 units  CAT: CAT can identify the health impairment of COPD patients and is better correlated with disease progression.  CAT has a scoring range of zero to 40. The CAT score is classified into four groups of low (less than 10), medium (10 - 20), high (21-30) and very high (31-40) based on the impact level of disease on health status. A CAT score over 10 suggests significant symptoms.  A worsening CAT score could be explained by an exacerbation, poor medication adherence, poor inhaler technique, or progression of COPD or comorbid conditions.  CAT MCID is 2 points  mMRC: mMRC (Modified Medical Research Council) Dyspnea Scale is used to  assess the degree of baseline functional disability in patients of respiratory disease due to dyspnea. No minimal important difference is established. A decrease in score of 1 point or greater is considered a positive change.   Pulmonary Function Assessment:   Exercise Target Goals: Exercise Program Goal: Individual exercise prescription set using results  from initial 6 min walk test and THRR while considering  patient's activity barriers and safety.   Exercise Prescription Goal: Initial exercise prescription builds to 30-45 minutes a day of aerobic activity, 2-3 days per week.  Home exercise guidelines will be given to patient during program as part of exercise prescription that the participant will acknowledge.  Education: Aerobic Exercise: - Group verbal and visual presentation on the components of exercise prescription. Introduces F.I.T.T principle from ACSM for exercise prescriptions.  Reviews F.I.T.T. principles of aerobic exercise including progression. Written material given at graduation. Flowsheet Row Pulmonary Rehab from 04/24/2023 in [facility] Re[facility]  Education need identified 04/24/23    Education: Resistance Exercise: - Group verbal and visual presentation on the components of exercise prescription. Introduces F.I.T.T principle from ACSM for exercise prescriptions  Reviews F.I.T.T. principles of resistance exercise including progression. Written material given at graduation.    Education: Exercise & Equipment Safety: - Individual verbal instruction and demonstration of equipment use and safety with use of the equipment. Flowsheet Row Pulmonary Rehab from 04/24/2023 in [facility] Card[facility] and Pulmonary Rehab  Date 04/24/23  Educator NT  Instruction Review Code 1- Verbalizes Understanding    Education: Exercise Physiology & General Exercise Guidelines: - Group verbal and written instruction with models to review the exercise physiology of the cardiovascular system  and associated critical values. Provides general exercise guidelines with specific guidelines to those with heart or lung disease.    Education: Flexibility, Balance, Mind/Body Relaxation: - Group verbal and visual presentation with interactive activity on the components of exercise prescription. Introduces F.I.T.T principle from ACSM for exercise prescriptions. Reviews F.I.T.T. principles of flexibility and balance exercise training including progression. Also discusses the mind body connection.  Reviews various relaxation techniques to help reduce and manage stress (i.e. Deep breathing, progressive muscle relaxation, and visualization). Balance handout provided to take home. Written material given at graduation.   Activity Barriers & Risk Stratification:  Activity Barriers & Cardiac Risk Stratification - 04/24/23 1153       Activity Barriers & Cardiac Risk Stratification   Activity Barriers Shortness of Breath;Arthritis          6 Minute Walk:  6 Minute Walk     Row Name 04/24/23 1137         6 Minute Walk   Phase Initial     Distance 615 feet     Walk Time 4.73 minutes     # of Rest Breaks 3     MPH 1.48     METS 1.18     RPE 13     Perceived Dyspnea  2     VO2 Peak 4.13     Symptoms No     Resting HR 77 bpm     Resting BP 122/64     Resting Oxygen Saturation  95 %     Exercise Oxygen Saturation  during 6 min walk 90 %     Max Ex. HR 119 bpm     Max Ex. BP 142/66     2 Minute Post BP 120/62       Interval HR   1 Minute HR 105     2 Minute HR 111     3 Minute HR 119     4 Minute HR 112     5 Minute HR 115     6 Minute HR 119     2 Minute Post HR 79     Interval Heart Rate? Yes  Interval Oxygen   Interval Oxygen? Yes     Baseline Oxygen Saturation % 95 %     1 Minute Oxygen Saturation % 93 %     1 Minute Liters of Oxygen 1.5 L     2 Minute Oxygen Saturation % 91 %     2 Minute Liters of Oxygen 1.5 L     3 Minute Oxygen Saturation % 90 %     3 Minute  Liters of Oxygen 1.5 L     4 Minute Oxygen Saturation % 92 %     4 Minute Liters of Oxygen 1.5 L     5 Minute Oxygen Saturation % 91 %     5 Minute Liters of Oxygen 1.5 L     6 Minute Oxygen Saturation % 91 %     6 Minute Liters of Oxygen 1.5 L     2 Minute Post Oxygen Saturation % 95 %     2 Minute Post Liters of Oxygen 1.5 L       Oxygen Initial Assessment:  Oxygen Initial Assessment - 04/18/23 1441       Home Oxygen   Home Oxygen Device Home Concentrator;E-Tanks    Sleep Oxygen Prescription Continuous    Liters per minute 2    Home Exercise Oxygen Prescription Continuous    Liters per minute 1.5    Home Resting Oxygen Prescription Continuous    Liters per minute 1.5   as needed  only when sat drops below 88%   Compliance with Home Oxygen Use Yes      Intervention   Short Term Goals To learn and exhibit compliance with exercise, home and travel O2 prescription;To learn and understand importance of monitoring SPO2 with pulse oximeter and demonstrate accurate use of the pulse oximeter.;To learn and understand importance of maintaining oxygen saturations>88%;To learn and demonstrate proper pursed lip breathing techniques or other breathing techniques. ;To learn and demonstrate proper use of respiratory medications    Long  Term Goals Exhibits compliance with exercise, home  and travel O2 prescription;Verbalizes importance of monitoring SPO2 with pulse oximeter and return demonstration;Maintenance of O2 saturations>88%;Exhibits proper breathing techniques, such as pursed lip breathing or other method taught during program session;Compliance with respiratory medication;Demonstrates proper use of MDI's          Oxygen Re-Evaluation:  Oxygen Re-Evaluation     Row Name 04/30/23 0826 05/30/23 0802           Program Oxygen Prescription   Program Oxygen Prescription Continuous Continuous;E-Tanks      Liters per minute 2 2        Home Oxygen   Home Oxygen Device Home  Concentrator;E-Tanks Home Concentrator;E-Tanks      Sleep Oxygen Prescription Continuous Continuous      Liters per minute 2 1      Home Exercise Oxygen Prescription Continuous Continuous      Liters per minute 1.5 2      Home Resting Oxygen Prescription Continuous Continuous      Liters per minute 1.5 0  prn      Compliance with Home Oxygen Use Yes Yes        Goals/Expected Outcomes   Short Term Goals To learn and demonstrate proper pursed lip breathing techniques or other breathing techniques.  To learn and demonstrate proper pursed lip breathing techniques or other breathing techniques.       Long  Term Goals Exhibits proper breathing techniques, such as pursed lip breathing or  other method taught during program session Exhibits proper breathing techniques, such as pursed lip breathing or other method taught during program session      Comments Reviewed PLB technique with pt.  Talked about how it works and it's importance in maintaining their exercise saturations. Informed patient how to perform the Pursed Lipped breathing technique. Told patient to Inhale through the nose and out the mouth with pursed lips to keep their airways open, help oxygenate them better, practice when at rest or doing strenuous activity. Patient Verbalizes understanding of technique and will work on and be reiterated during [facility].      Goals/Expected Outcomes Short: Become more profiecient at using PLB. Long: Become independent at using PLB. Short: use PLB with exertion. Long: use PLB on exertion proficiently and independently.         Oxygen Discharge (Final Oxygen Re-Evaluation):  Oxygen Re-Evaluation - 05/30/23 0802       Program Oxygen Prescription   Program Oxygen Prescription Continuous;E-Tanks    Liters per minute 2      Home Oxygen   Home Oxygen Device Home Concentrator;E-Tanks    Sleep Oxygen Prescription Continuous    Liters per minute 1    Home Exercise Oxygen Prescription Continuous    Liters  per minute 2    Home Resting Oxygen Prescription Continuous    Liters per minute 0   prn   Compliance with Home Oxygen Use Yes      Goals/Expected Outcomes   Short Term Goals To learn and demonstrate proper pursed lip breathing techniques or other breathing techniques.     Long  Term Goals Exhibits proper breathing techniques, such as pursed lip breathing or other method taught during program session    Comments Informed patient how to perform the Pursed Lipped breathing technique. Told patient to Inhale through the nose and out the mouth with pursed lips to keep their airways open, help oxygenate them better, practice when at rest or doing strenuous activity. Patient Verbalizes understanding of technique and will work on and be reiterated during [facility].    Goals/Expected Outcomes Short: use PLB with exertion. Long: use PLB on exertion proficiently and independently.          Initial Exercise Prescription:  Initial Exercise Prescription - 04/24/23 1100       Date of Initial Exercise RX and Referring Provider   Date 04/24/23    Referring Provider Dr. Dedra Sanders      Oxygen   Maintain Oxygen Saturation 88% or higher      Recumbant Bike   Level 1    RPM 50    Watts 15    Minutes 15    METs 1.18      NuStep   Level 1    SPM 80    Minutes 15    METs 1.18      Track   Laps 12    Minutes 15    METs 1.65      Prescription Details   Frequency (times per week) 2    Duration Progress to 30 minutes of continuous aerobic without signs/symptoms of physical distress      Intensity   THRR 40-80% of Max Heartrate 103-129    Ratings of Perceived Exertion 11-13    Perceived Dyspnea 0-4      Progression   Progression Continue to progress workloads to maintain intensity without signs/symptoms of physical distress.      Resistance Training   Training Prescription Yes  Weight 3 lb    Reps 10-15          Perform Capillary Blood Glucose checks as needed.  Exercise  Prescription Changes:   Exercise Prescription Changes     Row Name 04/24/23 1100 05/15/23 1300 05/30/23 1800 06/13/23 1000 06/27/23 0700     Response to Exercise   Blood Pressure (Admit) 122/64 126/64 168/62 122/68 104/58   Blood Pressure (Exercise) 142/66 138/70 138/58 152/60 122/64   Blood Pressure (Exit) 120/62 122/60 130/60 128/62 148/60   Heart Rate (Admit) 77 bpm 74 bpm 81 bpm 88 bpm 66 bpm   Heart Rate (Exercise) 119 bpm 117 bpm 111 bpm 110 bpm 85 bpm   Heart Rate (Exit) 79 bpm 83 bpm 82 bpm 80 bpm 73 bpm   Oxygen Saturation (Admit) 95 % 94 % 96 % 95 % 94 %   Oxygen Saturation (Exercise) 90 % 90 % 89 % 92 % 89 %   Oxygen Saturation (Exit) 95 % 92 % 91 % 93 % 92 %   Rating of Perceived Exertion (Exercise) 13 15 13 15 11    Perceived Dyspnea (Exercise) 2 2 2  0 --   Symptoms none none none none none   Comments Results First 2 weeks of exercise -- -- --   Duration -- Progress to 30 minutes of  aerobic without signs/symptoms of physical distress Continue with 30 min of aerobic exercise without signs/symptoms of physical distress. Continue with 30 min of aerobic exercise without signs/symptoms of physical distress. Continue with 30 min of aerobic exercise without signs/symptoms of physical distress.   Intensity -- THRR unchanged THRR unchanged THRR unchanged THRR unchanged     Progression   Progression -- Continue to progress workloads to maintain intensity without signs/symptoms of physical distress. Continue to progress workloads to maintain intensity without signs/symptoms of physical distress. Continue to progress workloads to maintain intensity without signs/symptoms of physical distress. Continue to progress workloads to maintain intensity without signs/symptoms of physical distress.   Average METs -- 1.7 1.7 1.33 1     Resistance Training   Training Prescription -- Yes Yes Yes Yes   Weight -- 3 3 2  lb 2 lb   Reps -- 10-15 10-15 10-15 10-15     Interval Training   Interval  Training -- No No No No     Oxygen   Oxygen -- -- Continuous Continuous Continuous   Liters -- -- 2 2 2      Recumbant Bike   Level -- 1 -- -- --   Watts -- 6 -- -- --   Minutes -- 15 -- -- --   METs -- 2.26 -- -- --     NuStep   Level -- 1 1 1 1    Minutes -- 15 30 30 15    METs -- 2 2.2 1.4 1     Arm Ergometer   Level -- -- -- -- 1   Minutes -- -- -- -- 15   METs -- -- -- -- 1     Track   Laps -- 4 7  hall -- --   Minutes -- 15 15 -- --   METs -- 1.27 1.38 -- --     Oxygen   Maintain Oxygen Saturation -- 88% or higher 88% or higher 88% or higher 88% or higher      Exercise Comments:   Exercise Comments     Row Name 04/30/23 [phone]  Exercise Comments First full day of exercise!  Patient was oriented to gym and equipment including functions, settings, policies, and procedures.  Patient's individual exercise prescription and treatment plan were reviewed.  All starting workloads were established based on the results of the 6 minute walk test done at initial orientation visit.  The plan for exercise progression was also introduced and progression will be customized based on patient's performance and goals.          Exercise Goals and Review:   Exercise Goals     Row Name 04/24/23 1153             Exercise Goals   Increase Physical Activity Yes       Intervention Provide advice, education, support and counseling about physical activity/exercise needs.;Develop an individualized exercise prescription for aerobic and resistive training based on initial evaluation findings, risk stratification, comorbidities and participant's personal goals.       Expected Outcomes Short Term: Attend rehab on a regular basis to increase amount of physical activity.;Long Term: Add in home exercise to make exercise part of routine and to increase amount of physical activity.;Long Term: Exercising regularly at least 3-5 days a week.       Increase Strength and Stamina Yes        Intervention Provide advice, education, support and counseling about physical activity/exercise needs.;Develop an individualized exercise prescription for aerobic and resistive training based on initial evaluation findings, risk stratification, comorbidities and participant's personal goals.       Expected Outcomes Short Term: Increase workloads from initial exercise prescription for resistance, speed, and METs.;Long Term: Improve cardiorespiratory fitness, muscular endurance and strength as measured by increased METs and functional capacity ( );Short Term: Perform resistance training exercises routinely during rehab and add in resistance training at home       Able to understand and use rate of perceived exertion (RPE) scale Yes       Intervention Provide education and explanation on how to use RPE scale       Expected Outcomes Short Term: Able to use RPE daily in rehab to express subjective intensity level;Long Term:  Able to use RPE to guide intensity level when exercising independently       Able to understand and use Dyspnea scale Yes       Intervention Provide education and explanation on how to use Dyspnea scale       Expected Outcomes Short Term: Able to use Dyspnea scale daily in rehab to express subjective sense of shortness of breath during exertion;Long Term: Able to use Dyspnea scale to guide intensity level when exercising independently       Knowledge and understanding of Target Heart Rate Range (THRR) Yes       Intervention Provide education and explanation of THRR including how the numbers were predicted and where they are located for reference       Expected Outcomes Short Term: Able to state/look up THRR;Long Term: Able to use THRR to govern intensity when exercising independently;Short Term: Able to use daily as guideline for intensity in rehab       Able to check pulse independently Yes       Intervention Provide education and demonstration on how to check pulse in carotid and  radial arteries.;Review the importance of being able to check your own pulse for safety during independent exercise       Expected Outcomes Short Term: Able to explain why pulse checking is important during independent exercise;Long Term: Able  to check pulse independently and accurately       Understanding of Exercise Prescription Yes       Intervention Provide education, explanation, and written materials on patient's individual exercise prescription       Expected Outcomes Short Term: Able to explain program exercise prescription;Long Term: Able to explain home exercise prescription to exercise independently          Exercise Goals Re-Evaluation :  Exercise Goals Re-Evaluation     Row Name 04/30/23 0826 05/15/23 1326 05/30/23 1809 06/13/23 1033 06/27/23 0749     Exercise Goal Re-Evaluation   Exercise Goals Review Able to understand and use rate of perceived exertion (RPE) scale;Able to understand and use Dyspnea scale;Knowledge and understanding of Target Heart Rate Range (THRR);Understanding of Exercise Prescription Increase Physical Activity;Increase Strength and Stamina;Understanding of Exercise Prescription Increase Physical Activity;Increase Strength and Stamina;Understanding of Exercise Prescription Increase Physical Activity;Increase Strength and Stamina;Understanding of Exercise Prescription Increase Physical Activity;Increase Strength and Stamina;Understanding of Exercise Prescription   Comments Reviewed RPE and dyspnea scale, THR and program prescription with pt today.  Pt voiced understanding and was given a copy of goals to take home. [name] is off to a good start in the program. She was able to attend her first 3 sessions during this review period. During these few sessions she was able use the recumbent bike and T4 nustep at level 1, and walk 4 laps on the track. We will continue to monitor her progress in the program. [name] is off to a good start in the program. She maintained level 1 on  the T4 nustep. She was also able to increase to 7 laps on the hall track. We will continue to monitor her progress in the program. [name] is doing well in rehab. She maintained level 1 on the T4 nustep for 30 minutes. She dropped down to 2 lb handweights due to her shoulder. We will continue to monitor her progress in the program. [name] has only attended one session of rehab since the last review. She continued to use the T4 nustep at level 1, and she began using the arm crank at level 1 as well. We will continue to monitor her progress in the program.   Expected Outcomes Short: Use RPE daily to regulate intensity. Long: Follow program prescription in THR. Short: Continue to follow exercise prescription. Long: Continue exercise to improve strength and stamina. Short: Try level 2 on the T4 nustep. Long: Continue exercise to improve strength and stamina. Short: Try level 2 on the T4 nustep. Long: Continue exercise to improve strength and stamina. Short: Attend rehab more consistently. Long: Continue exercise to improve strength and stamina.      Discharge Exercise Prescription (Final Exercise Prescription Changes):  Exercise Prescription Changes - 06/27/23 0700       Response to Exercise   Blood Pressure (Admit) 104/58    Blood Pressure (Exercise) 122/64    Blood Pressure (Exit) 148/60    Heart Rate (Admit) 66 bpm    Heart Rate (Exercise) 85 bpm    Heart Rate (Exit) 73 bpm    Oxygen Saturation (Admit) 94 %    Oxygen Saturation (Exercise) 89 %    Oxygen Saturation (Exit) 92 %    Rating of Perceived Exertion (Exercise) 11    Symptoms none    Duration Continue with 30 min of aerobic exercise without signs/symptoms of physical distress.    Intensity THRR unchanged      Progression   Progression Continue to progress  workloads to maintain intensity without signs/symptoms of physical distress.    Average METs 1      Resistance Training   Training Prescription Yes    Weight 2 lb    Reps 10-15       Interval Training   Interval Training No      Oxygen   Oxygen Continuous    Liters 2      NuStep   Level 1    Minutes 15    METs 1      Arm Ergometer   Level 1    Minutes 15    METs 1      Oxygen   Maintain Oxygen Saturation 88% or higher          Nutrition:  Target Goals: Understanding of nutrition guidelines, daily intake of sodium 1500mg , cholesterol 200mg , calories 30% from fat and 7% or less from saturated fats, daily to have 5 or more servings of fruits and vegetables.  Education: All About Nutrition: -Group instruction provided by verbal, written material, interactive activities, discussions, models, and posters to present general guidelines for heart healthy nutrition including fat, fiber, MyPlate, the role of sodium in heart healthy nutrition, utilization of the nutrition label, and utilization of this knowledge for meal planning. Follow up email sent as well. Written material given at graduation.   Biometrics:  Pre Biometrics - 04/24/23 1154       Pre Biometrics   Height 4' 11 (1.499 m)    Weight 158 lb 11.2 oz (72 kg)    Waist Circumference 40 inches    Hip Circumference 42 inches    Waist to Hip Ratio 0.95 %    BMI (Calculated) 32.04    Single Leg Stand 6.78 seconds           Nutrition Therapy Plan and Nutrition Goals:  Nutrition Therapy & Goals - 04/24/23 1338       Nutrition Therapy   Diet Cardiac, low Na    Protein (specify units) 70-90    Fiber 25 grams    Whole Grain Foods 3 servings    Saturated Fats 15 max. grams    Fruits and Vegetables 5 servings/day    Sodium 2 grams      Personal Nutrition Goals   Nutrition Goal Eat 15-30gProtein and 30-60gCarbs at each meal.    Personal Goal #2 Read labels and reduce sodium intake to below 2300mg . Ideally 1500mg  per day.    Personal Goal #3 Drink 48-64oz of water daily    Comments Patient drinking ~48oz of water. Sometimes will have diet pepsi. She usually eats 3 meals per day. Lunch  being her largest meal. She says she usually has veggies at least 1 time per day and likes to snack on fruit. Reviewed mediterranean diet handout. Educated on types of fats, sources, and how to read labels. She watches her sodium intake and says most days she feels she eats well, but sometimes she wants a hotdog. Encouraged and supported making change that are sustainable and allows for some indulgences.      Intervention Plan   Intervention Prescribe, educate and counsel regarding individualized specific dietary modifications aiming towards targeted core components such as weight, hypertension, lipid management, diabetes, heart failure and other comorbidities.;Nutrition handout(s) given to patient.    Expected Outcomes Short Term Goal: Understand basic principles of dietary content, such as calories, fat, sodium, cholesterol and nutrients.;Short Term Goal: A plan has been developed with personal nutrition goals set during dietitian appointment.;Long  Term Goal: Adherence to prescribed nutrition plan.          Nutrition Assessments:  MEDIFICTS Score Key: >=70 Need to make dietary changes  40-70 Heart Healthy Diet <= 40 Therapeutic Level Cholesterol Diet  Flowsheet Row Pulmonary Rehab from 04/24/2023 in [facility] Card[facility] Pulmonary Rehab  Picture Your Plate Total Score on Admission 77   Picture Your Plate Scores: <59 Unhealthy dietary pattern with much room for improvement. 41-50 Dietary pattern unlikely to meet recommendations for good health and room for improvement. 51-60 More healthful dietary pattern, with some room for improvement.  >60 Healthy dietary pattern, although there may be some specific behaviors that could be improved.   Nutrition Goals Re-Evaluation:  Nutrition Goals Re-Evaluation     Row Name 05/30/23 0801             Goals   Comment Patient was informed on why it is important to maintain a balanced diet when dealing with Respiratory issues. Explained that it takes a  lot of energy to breath and when they are short of breath often they will need to have a good diet to help keep up with the calories they are expending for breathing.       Expected Outcome Short: Choose and plan snacks accordingly to patients caloric intake to improve breathing. Long: Maintain a diet independently that meets their caloric intake to aid in daily shortness of breath.          Nutrition Goals Discharge (Final Nutrition Goals Re-Evaluation):  Nutrition Goals Re-Evaluation - 05/30/23 0801       Goals   Comment Patient was informed on why it is important to maintain a balanced diet when dealing with Respiratory issues. Explained that it takes a lot of energy to breath and when they are short of breath often they will need to have a good diet to help keep up with the calories they are expending for breathing.    Expected Outcome Short: Choose and plan snacks accordingly to patients caloric intake to improve breathing. Long: Maintain a diet independently that meets their caloric intake to aid in daily shortness of breath.          Psychosocial: Target Goals: Acknowledge presence or absence of significant depression and/or stress, maximize coping skills, provide positive support system. Participant is able to verbalize types and ability to use techniques and skills needed for reducing stress and depression.   Education: Stress, Anxiety, and Depression - Group verbal and visual presentation to define topics covered.  Reviews how body is impacted by stress, anxiety, and depression.  Also discusses healthy ways to reduce stress and to treat/manage anxiety and depression.  Written material given at graduation.   Education: Sleep Hygiene -Provides group verbal and written instruction about how sleep can affect your health.  Define sleep hygiene, discuss sleep cycles and impact of sleep habits. Review good sleep hygiene tips.    Initial Review & Psychosocial Screening:  Initial Psych  Review & Screening - 04/18/23 1442       Initial Review   Current issues with None Identified      Family Dynamics   Good Support System? Yes   husband  56 yrs,     Barriers   Psychosocial barriers to participate in program There are no identifiable barriers or psychosocial needs.      Screening Interventions   Interventions To provide support and resources with identified psychosocial needs;Provide feedback about the scores to participant;Encouraged to exercise  Expected Outcomes Short Term goal: Utilizing psychosocial counselor, staff and physician to assist with identification of specific Stressors or current issues interfering with healing process. Setting desired goal for each stressor or current issue identified.;Long Term Goal: Stressors or current issues are controlled or eliminated.;Short Term goal: Identification and review with participant of any Quality of Life or Depression concerns found by scoring the questionnaire.;Long Term goal: The participant improves quality of Life and PHQ9 Scores as seen by post scores and/or verbalization of changes          Quality of Life Scores:  Scores of 19 and below usually indicate a poorer quality of life in these areas.  A difference of  2-3 points is a clinically meaningful difference.  A difference of 2-3 points in the total score of the Quality of Life Index has been associated with significant improvement in overall quality of life, self-image, physical symptoms, and general health in studies assessing change in quality of life.  PHQ-9: Review Flowsheet  More data exists      05/30/2023 05/08/2023 04/24/2023 01/22/2023 11/21/2022  Depression screen PHQ 2/9  Decreased Interest 0 0 0 0 0 0  Down, Depressed, Hopeless 0 1 2 0 0 0  PHQ - 2 Score 0 1 2 0 0 0  Altered sleeping 3 - 2 0 -  Tired, decreased energy 0 - 1 0 -  Change in appetite 0 - 2 0 -  Feeling bad or failure about yourself  0 - 0 0 -  Trouble concentrating 0 - 0 0 -   Moving slowly or fidgety/restless 0 - 0 0 -  Suicidal thoughts 0 - 0 0 -  PHQ-9 Score 3 - 7 0 -  Difficult doing work/chores Not difficult at all - Somewhat difficult Not difficult at all -    Details       Multiple values from one day are sorted in reverse-chronological order        Interpretation of Total Score  Total Score Depression Severity:  1-4 = Minimal depression, 5-9 = Mild depression, 10-14 = Moderate depression, 15-19 = Moderately severe depression, 20-27 = Severe depression   Psychosocial Evaluation and Intervention:  Psychosocial Evaluation - 04/18/23 1500       Psychosocial Evaluation & Interventions   Interventions Encouraged to exercise with the program and follow exercise prescription    Comments Thre are no barriers to attending the program.  She lives with her husband (70yrs) and he is her support. She is ready to start the program    Expected Outcomes STG attend all scheduled sessions, work on exercise progression as guided by the exercise physiologistLTG maintian exercise prgogression after discharge, looking for decreased shaortness of breath    Continue Psychosocial Services  Follow up required by staff          Psychosocial Re-Evaluation:  Psychosocial Re-Evaluation     Row Name 05/30/23 0808             Psychosocial Re-Evaluation   Current issues with None Identified       Comments Reviewed patient health questionnaire (PHQ-9) with patient for follow up. Previously, patients score indicated signs/symptoms of depression.  Reviewed to see if patient is improving symptom wise while in program.  Score improved and patient states that it is because she is able to work on her health.       Expected Outcomes Short: Continue to attend [facility] regularly for regular exercise and social engagement. Long: Continue to improve symptoms  and manage a positive mental state.       Interventions Encouraged to attend Pulmonary Rehabilitation for the exercise        Continue Psychosocial Services  Follow up required by staff          Psychosocial Discharge (Final Psychosocial Re-Evaluation):  Psychosocial Re-Evaluation - 05/30/23 0808       Psychosocial Re-Evaluation   Current issues with None Identified    Comments Reviewed patient health questionnaire (PHQ-9) with patient for follow up. Previously, patients score indicated signs/symptoms of depression.  Reviewed to see if patient is improving symptom wise while in program.  Score improved and patient states that it is because she is able to work on her health.    Expected Outcomes Short: Continue to attend [facility] regularly for regular exercise and social engagement. Long: Continue to improve symptoms and manage a positive mental state.    Interventions Encouraged to attend Pulmonary Rehabilitation for the exercise    Continue Psychosocial Services  Follow up required by staff          Education: Education Goals: Education classes will be provided on a weekly basis, covering required topics. Participant will state understanding/return demonstration of topics presented.  Learning Barriers/Preferences:   General Pulmonary Education Topics:  Infection Prevention: - Provides verbal and written material to individual with discussion of infection control including proper hand washing and proper equipment cleaning during exercise session. Flowsheet Row Pulmonary Rehab from 04/24/2023 in [facility] Card[facility] and Pulmonary Rehab  Date 04/24/23  Educator NT  Instruction Review Code 1- Verbalizes Understanding    Falls Prevention: - Provides verbal and written material to individual with discussion of falls prevention and safety. Flowsheet Row Pulmonary Rehab from 04/24/2023 in [facility] Card[facility] and Pulmonary Rehab  Date 04/18/23  Educator SB  Instruction Review Code 1- Verbalizes Understanding    Chronic Lung Disease Review: - Group verbal instruction with posters, models, PowerPoint presentations and  videos,  to review new updates, new respiratory medications, new advancements in procedures and treatments. Providing information on websites and 800 numbers for continued self-education. Includes information about supplement oxygen, available portable oxygen systems, continuous and intermittent flow rates, oxygen safety, concentrators, and Medicare reimbursement for oxygen. Explanation of Pulmonary Drugs, including class, frequency, complications, importance of spacers, rinsing mouth after steroid MDI's, and proper cleaning methods for nebulizers. Review of basic lung anatomy and physiology related to function, structure, and complications of lung disease. Review of risk factors. Discussion about methods for diagnosing sleep apnea and types of masks and machines for OSA. Includes a review of the use of types of environmental controls: home humidity, furnaces, filters, dust mite/pet prevention, HEPA vacuums. Discussion about weather changes, air quality and the benefits of nasal washing. Instruction on Warning signs, infection symptoms, calling MD promptly, preventive modes, and value of vaccinations. Review of effective airway clearance, coughing and/or vibration techniques. Emphasizing that all should Create an Action Plan. Written material given at graduation. Flowsheet Row Pulmonary Rehab from 04/24/2023 in [facility] Cardiac and Pulmonary Rehab  Education need identified 04/24/23    AED/CPR: - Group verbal and written instruction with the use of models to demonstrate the basic use of the AED with the basic ABC's of resuscitation.    Anatomy and Cardiac Procedures: - Group verbal and visual presentation and models provide information about basic cardiac anatomy and function. Reviews the testing methods done to diagnose heart disease and the outcomes of the test results. Describes the treatment choices: Medical Management, Angioplasty, or Coronary Bypass  Surgery for treating various heart conditions  including Myocardial Infarction, Angina, Valve Disease, and Cardiac Arrhythmias.  Written material given at graduation.   Medication Safety: - Group verbal and visual instruction to review commonly prescribed medications for heart and lung disease. Reviews the medication, class of the drug, and side effects. Includes the steps to properly store meds and maintain the prescription regimen.  Written material given at graduation.   Other: -Provides group and verbal instruction on various topics (see comments)   Knowledge Questionnaire Score:  Knowledge Questionnaire Score - 04/24/23 1126       Knowledge Questionnaire Score   Pre Score 15/18           Core Components/Risk Factors/Patient Goals at Admission:  Personal Goals and Risk Factors at Admission - 04/18/23 1451       Core Components/Risk Factors/Patient Goals on Admission    Weight Management Yes    Intervention Weight Management: Develop a combined nutrition and exercise program designed to reach desired caloric intake, while maintaining appropriate intake of nutrient and fiber, sodium and fats, and appropriate energy expenditure required for the weight goal.;Weight Management: Provide education and appropriate resources to help participant work on and attain dietary goals.    Admit Weight 160 lb (72.6 kg)    Goal Weight: Short Term 159 lb (72.1 kg)    Goal Weight: Long Term 130 lb (59 kg)    Expected Outcomes Short Term: Continue to assess and modify interventions until short term weight is achieved;Long Term: Adherence to nutrition and physical activity/exercise program aimed toward attainment of established weight goal;Weight Loss: Understanding of general recommendations for a balanced deficit meal plan, which promotes 1-2 lb weight loss per week and includes a negative energy balance of [phone] kcal/d    Improve shortness of breath with ADL's Yes    Intervention Provide education, individualized exercise plan and daily  activity instruction to help decrease symptoms of SOB with activities of daily living.    Expected Outcomes Short Term: Improve cardiorespiratory fitness to achieve a reduction of symptoms when performing ADLs;Long Term: Be able to perform more ADLs without symptoms or delay the onset of symptoms    Increase knowledge of respiratory medications and ability to use respiratory devices properly  Yes    Intervention Provide education and demonstration as needed of appropriate use of medications, inhalers, and oxygen therapy.    Expected Outcomes Short Term: Achieves understanding of medications use. Understands that oxygen is a medication prescribed by physician. Demonstrates appropriate use of inhaler and oxygen therapy.;Long Term: Maintain appropriate use of medications, inhalers, and oxygen therapy.    Hypertension Yes    Intervention Provide education on lifestyle modifcations including regular physical activity/exercise, weight management, moderate sodium restriction and increased consumption of fresh fruit, vegetables, and low fat dairy, alcohol moderation, and smoking cessation.;Monitor prescription use compliance.    Expected Outcomes Short Term: Continued assessment and intervention until BP is < 140/80mm HG in hypertensive participants. < 130/80mm HG in hypertensive participants with diabetes, heart failure or chronic kidney disease.;Long Term: Maintenance of blood pressure at goal levels.    Lipids Yes    Intervention Provide education and support for participant on nutrition & aerobic/resistive exercise along with prescribed medications to achieve LDL 70mg , HDL >40mg .    Expected Outcomes Short Term: Participant states understanding of desired cholesterol values and is compliant with medications prescribed. Participant is following exercise prescription and nutrition guidelines.;Long Term: Cholesterol controlled with medications as prescribed, with individualized exercise RX and with  personalized  nutrition plan. Value goals: LDL < 70mg , HDL > 40 mg.          Education:Diabetes - Individual verbal and written instruction to review signs/symptoms of diabetes, desired ranges of glucose level fasting, after meals and with exercise. Acknowledge that pre and post exercise glucose checks will be done for 3 sessions at entry of program.   Know Your Numbers and Heart Failure: - Group verbal and visual instruction to discuss disease risk factors for cardiac and pulmonary disease and treatment options.  Reviews associated critical values for Overweight/Obesity, Hypertension, Cholesterol, and Diabetes.  Discusses basics of heart failure: signs/symptoms and treatments.  Introduces Heart Failure Zone chart for action plan for heart failure.  Written material given at graduation.   Core Components/Risk Factors/Patient Goals Review:   Goals and Risk Factor Review     Row Name 05/30/23 0800             Core Components/Risk Factors/Patient Goals Review   Personal Goals Review Improve shortness of breath with ADL's       Review Spoke to patient about their shortness of breath and what they can do to improve. Patient has been informed of breathing techniques when starting the program. Patient is informed to tell staff if they have had any med changes and that certain meds they are taking or not taking can be causing shortness of breath.       Expected Outcomes Short: Attend [facility] regularly to improve shortness of breath with ADL's. Long: maintain independence with ADL's          Core Components/Risk Factors/Patient Goals at Discharge (Final Review):   Goals and Risk Factor Review - 05/30/23 0800       Core Components/Risk Factors/Patient Goals Review   Personal Goals Review Improve shortness of breath with ADL's    Review Spoke to patient about their shortness of breath and what they can do to improve. Patient has been informed of breathing techniques when starting the program. Patient is  informed to tell staff if they have had any med changes and that certain meds they are taking or not taking can be causing shortness of breath.    Expected Outcomes Short: Attend [facility] regularly to improve shortness of breath with ADL's. Long: maintain independence with ADL's          ITP Comments:  ITP Comments     Row Name 04/18/23 1459 04/24/23 1124 04/30/23 0825 05/08/23 0850 06/05/23 1030   ITP Comments Virtual orientation call completed today. shehas an appointment on Date: 04/24/2023  for EP eval and gym Orientation.  Documentation of diagnosis can be found in [facility] 03/08/2023 . Completed and gym orientation for respiratory care services. Initial ITP created and sent for review to Dr. Faud Aleskerov, Medical Director. First full day of exercise!  Patient was oriented to gym and equipment including functions, settings, policies, and procedures.  Patient's individual exercise prescription and treatment plan were reviewed.  All starting workloads were established based on the results of the 6 minute walk test done at initial orientation visit.  The plan for exercise progression was also introduced and progression will be customized based on patient's performance and goals. 30 Day review completed. Medical Director ITP review done, changes made as directed, and signed approval by Medical Director. New to prgroam 30 Day review completed. Medical Director ITP review done, changes made as directed, and signed approval by Medical Director.    Row Name 07/01/23 1000  ITP Comments Ms. Haser informed staff she would like to be discharged at this time. She has completed 12 of 36 sessions.          Comments: Early Discharge ITP

## 2023-07-02 ENCOUNTER — Encounter

## 2023-07-03 ENCOUNTER — Telehealth: Payer: Self-pay | Admitting: Family

## 2023-07-03 NOTE — Progress Notes (Unsigned)
 Advanced Heart Failure Clinic Note   PCP: Gretta Comer POUR, NP (last seen 03/25) Cardiologist: Lonni Hanson, MD/ Franchester Mail, PA (last seen 10/24)  Chief Complaint: shortness of breath   HPI:  Ms Deas is a 78 y/o female with a history of CAD, COPD, lung cancer, HTN, adenoma of left adrenal gland, lung cancer, anemia, anxiety, diverticulosis, GERD, hypothyroidism, bilateral iliac artery stenosis, left pulmonary nodule, polymyalgia rheumatica, tobacco use and chronic heart failure.   Echo 06/28/11: EF 55-60% with Grade I DD and mild AR Echo 01/15/13: EF 55-60% with mild AR Echo 02/16/18: EF 60-65%   Admitted 09/06/22 due to bilateral leg edema, weight gain, shortness of breath with exertion, orthopnea. Chest x-ray showed vascular congestion, patient has mild elevation of BNP.  Patient also had worsening hypoxemia on 3 L oxygen, was placed on 6 L oxygen. Given IV lasix  with improvement and oxygen weaned back to baseline 3L. Echocardiogram showed ejection fraction 55 to 60% indeterminate diastolic dysfunction. Mild pulmonary artery hypertension. Hypokalemia corrected. Venous ultrasound was negative for DVT.   She presents today for a HF f/u visit with a chief complaint of moderate shortness of breath. Wearing oxygen at 1L. Has occasional pedal edema, fatigue and soreness in right lateral thigh. Goes to pulmonary rehab twice weekly for 45 minute sessions. Denies chest pain, palpitations, abdominal distention or dizziness.   Taking furosemide  PRN for swelling etc. Estimates that she takes it ~ monthly. She did take it yesterday because she had eaten a sandwich from [employer] in [place] and noticed pedal edema the following day.   ROS: All systems negative except as listed in HPI, PMH and Problem List.  SH:  Social History   Socioeconomic History   Marital status: Married    Spouse name: [name]   Number of children: 1   Years of education: Not on file   Highest education level: 12th grade   Occupational History   Occupation: Catering manager as a Paramedic: RETIRED    Comment: retired  Tobacco Use   Smoking status: Former    Current packs/day: 0.00    Average packs/day: 1.5 packs/day for 20.0 years (30.0 ttl pk-yrs)    Types: Cigarettes    Start date: 02/11/1989    Quit date: 02/11/2009    Years since quitting: 14.3   Smokeless tobacco: Never  Vaping Use   Vaping status: Never Used  Substance and Sexual Activity   Alcohol use: No    Alcohol/week: 0.0 standard drinks of alcohol   Drug use: No   Sexual activity: Not Currently  Other Topics Concern   Not on file  Social History Narrative   Lives in [place] with her husband.  Takes care of her two grandchildren, ages 13 and 2, everyday in [place].  Has one son who is 20.   Desires CPR   Would not want prolonged life support if futile   Social Drivers of Health   Financial Resource Strain: Patient Declined (05/01/2023)   Overall Financial Resource Strain (CARDIA)    Difficulty of Paying Living Expenses: Patient declined  Food Insecurity: Patient Declined (05/01/2023)   Hunger Vital Sign    Worried About Running Out of Food in the Last Year: Patient declined    Ran Out of Food in the Last Year: Patient declined  Transportation Needs: Patient Declined (05/01/2023)   PRAPARE - Administrator, Civil Service (Medical): Patient declined    Lack of Transportation (Non-Medical): Patient declined  Physical Activity: Sufficiently Active (05/01/2023)   Exercise Vital Sign    Days of Exercise per Week: 3 days    Minutes of Exercise per Session: 60 min  Stress: No Stress Concern Present (05/01/2023)   [employer] of Occupational Health - Occupational Stress Questionnaire    Feeling of Stress : Only a little  Social Connections: Unknown (05/01/2023)   Social Connection and Isolation Panel    Frequency of Communication with Friends and Family: More than three times a week    Frequency of  Social Gatherings with Friends and Family: More than three times a week    Attends Religious Services: Patient declined    Active Member of Clubs or Organizations: Patient declined    Attends Banker Meetings: Not on file    Marital Status: Married  Intimate Partner Violence: Not At Risk (05/08/2023)   Humiliation, Afraid, Rape, and Kick questionnaire    Fear of Current or Ex-Partner: No    Emotionally Abused: No    Physically Abused: No    Sexually Abused: No    FH:  Family History  Problem Relation Age of Onset   Heart attack Mother 61   Cancer Father        lung/chest wall    Prostate cancer Father    Diabetes Brother        siblings   Hypertension Sister        x 2   Heart disease Brother    Breast cancer Paternal Grandmother    Heart disease Sister    Diabetes Sister    Tuberculosis Sister    Cirrhosis Brother     Past Medical History:  Diagnosis Date   (HFpEF) heart failure with preserved ejection fraction (HCC) 06/28/2011   a.) TTE 06/28/2011: EF 55-60%, mild AR, G1DD; b.) TTE 01/16/2013: EF 55-60%, mild MR; c.) TTE 02/16/2018: EF 60-65%, G1DD; d.) MPI 11/26/2018: EF 56%   Abnormal electrocardiogram 11/12/2018   Acute kidney failure (HCC) 01/12/2017   Acute on chronic respiratory failure with hypoxia (HCC) 09/06/2022   Adenoma of left adrenal gland    Anemia    Anxiety    Aortic atherosclerosis (HCC)    Bilateral carpal tunnel syndrome    CAD (coronary artery disease)    a.) HR CT chest 10/30/2021: 2 vessel CAD (LAD/RCA)   CAP (community acquired pneumonia)    COPD (chronic obstructive pulmonary disease) (HCC)    COPD exacerbation (HCC) 01/21/2018   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   Diverticulosis    Dyspnea    DUE TO HEAT WITH COPD   Epistaxis 01/09/2017   GERD (gastroesophageal reflux disease)    Herpes zoster 07/08/2015   Hypertension    Hypothyroidism    Iatrogenic hypotension 01/12/2017   Iliac artery stenosis, bilateral (HCC)     Morbid obesity with BMI of 40.0-44.9, adult (HCC)    New onset of headaches    Osteopenia    PMR (polymyalgia rheumatica) (HCC)    Pneumonia 02/13/2018   PONV (postoperative nausea and vomiting)    Pulmonary nodule, left    a.) CT chest 10/30/2021: 1.5 cm medial LUL nodule; b.) PET CT 11/21/2021: hypermetabolic LUL nodule (SUX max 9.3)   S/P carpal tunnel release 08/04/2020   Shingles    Stasis dermatitis    Strain of chest wall 01/20/2011   Tobacco abuse    Vitamin D  deficiency     Current Outpatient Medications  Medication Sig Dispense Refill   alendronate  (FOSAMAX ) 70 MG  tablet Take 70 mg by mouth once a week. Take with a full glass of water on an empty stomach.     atorvastatin  (LIPITOR) 10 MG tablet TAKE 1 TABLET BY MOUTH DAILY. FOR CHOLESTEROL 90 tablet 1   Calcium  Carb-Cholecalciferol (CALCIUM  + VITAMIN D3 PO) Take 1,200 mg by mouth daily.     dapagliflozin propanediol  (FARXIGA ) 10 MG TABS tablet TAKE 1 TABLET BY MOUTH DAILY BEFORE BREAKFAST. 90 tablet 3   famotidine  (PEPCID ) 20 MG tablet TAKE 1 TABLET BY MOUTH AFTER SUPPER 90 tablet 3   furosemide  (LASIX ) 40 MG tablet Take 1 tablet (40 mg total) by mouth daily. 30 tablet 0   Glycopyrrolate -Formoterol  (BEVESPI  AEROSPHERE) 9-4.8 MCG/ACT AERO Inhale 2 puffs into the lungs 2 (two) times daily. 3 each 3   levothyroxine  (SYNTHROID ) 75 MCG tablet TAKE 1 EVERY AM ON AN EMPTY STOMACH WITH WATER ONLY. NO FOOD OR OTHER MEDICATIONS FOR 30 MINUTES. 90 tablet 1   losartan  (COZAAR ) 100 MG tablet TAKE 1 TABLET (100 MG TOTAL) BY MOUTH DAILY. PLEASE CALL [phone] TO SCHEDULE A YEARLY APPOINTMENT. THANK YOU. 90 tablet 3   Multiple Vitamins-Minerals (PRESERVISION AREDS 2 PO) Take 1 tablet by mouth in the morning and at bedtime.     pantoprazole  (PROTONIX ) 40 MG tablet Take 1 tablet (40 mg total) by mouth daily. for heartburn. 90 tablet 3   pentoxifylline  (TRENTAL ) 400 MG CR tablet Take 1 tablet (400 mg total) by mouth in the morning and at  bedtime. 180 tablet 1   spironolactone  (ALDACTONE ) 25 MG tablet Take 0.5 tablets (12.5 mg total) by mouth daily. 45 tablet 3   tacrolimus  (PROTOPIC ) 0.1 % ointment Apply to aa's lower legs BID PRN. 60 g 5   No current facility-administered medications for this visit.   There were no vitals filed for this visit.  Wt Readings from Last 3 Encounters:  06/21/23 159 lb (72.1 kg)  06/12/23 158 lb 12.8 oz (72 kg)  05/24/23 159 lb 2 oz (72.2 kg)   Lab Results  Component Value Date   CREATININE 1.04 (H) 05/30/2023   CREATININE 0.98 10/17/2022   CREATININE 1.03 (H) 10/08/2022    PHYSICAL EXAM:  General: Well appearing. No resp difficulty HEENT: normal Neck: supple, no JVD Cor: Regular rhythm, rate. No rubs, gallops or murmurs Lungs: clear Abdomen: soft, nontender, nondistended. Extremities: no cyanosis, clubbing, rash, edema Neuro: alert & oriented X 3. Moves all 4 extremities w/o difficulty. Affect pleasant   ECG: not done   ASSESSMENT & PLAN:  1: NICM with preserved ejection fraction- - suspect etiology due to COPD  - NYHA class III - euvolemic - weighing daily; reviewed to call for an overnight weight gain of > 2 pounds or a weekly weight gain of > 5 pounds - weight down 4 pounds from last visit here 4 months ago - Echo 06/28/11: EF 55-60% with Grade I DD and mild AR - Echo 01/15/13: EF 55-60% with mild AR - Echo 02/16/18: EF 60-65%  - Echo 09/07/22: EF 55-60% with mildly elevated PA pressure, mild LAE and mild MR - continue farxiga  10mg  daily - continue furosemide  40mg  PRN - continue losartan  100mg  daily - continue spironolactone  12.5mg  daily - saw cardiology Dene) 10/24 - Mg 09/08/22 was 1.7 - BNP 09/06/22 was 237.0  2: HTN- - BP 137/40 - saw PCP Myrna) 03/25 - BMP 10/17/22 reviewed: sodium 138, potassium 4.1, creatinine 0.98 & GFR 59 - BMET next week when she's here for pulm rehab  3:  Stage II COPD- - saw pulmonology Herlene) 03/25 - wearing oxygen at 1L  during the day and 3L at bedtime  - quit smoking ~ 2009; smoked 1.5 ppd at her peak - participating in pulmonary rehab  4: Lung [place]- - had chest CT 01/25 - saw Dr Ubaldo 01/25  5: Hyperlipidemia- - LDL 02/22/22 was 59 - continue atorvastatin  10mg  daily - lipid panel next week   Return in 6 months, sooner if needed.   Ellouise Class, [place] 05/24/23

## 2023-07-03 NOTE — Telephone Encounter (Signed)
 Called to confirm/remind patient of their appointment at the [facility] on 07/04/23.   Appointment:   [] Confirmed  [x] Left mess   [] No answer/No voice mail  [] VM Full/unable to leave message  [] Phone not in service  Patient reminded to bring all medications and/or complete list.  Confirmed patient has transportation. Gave directions, instructed to utilize valet parking.

## 2023-07-04 ENCOUNTER — Encounter

## 2023-07-04 ENCOUNTER — Encounter: Payer: Self-pay | Admitting: Family

## 2023-07-04 ENCOUNTER — Ambulatory Visit: Attending: Family | Admitting: Family

## 2023-07-04 VITALS — BP 112/58 | HR 69 | Wt 159.8 lb

## 2023-07-04 DIAGNOSIS — Z9981 Dependence on supplemental oxygen: Secondary | ICD-10-CM | POA: Diagnosis not present

## 2023-07-04 DIAGNOSIS — I428 Other cardiomyopathies: Secondary | ICD-10-CM | POA: Diagnosis not present

## 2023-07-04 DIAGNOSIS — I110 Hypertensive heart disease with heart failure: Secondary | ICD-10-CM | POA: Diagnosis present

## 2023-07-04 DIAGNOSIS — I10 Essential (primary) hypertension: Secondary | ICD-10-CM | POA: Diagnosis not present

## 2023-07-04 DIAGNOSIS — I2510 Atherosclerotic heart disease of native coronary artery without angina pectoris: Secondary | ICD-10-CM | POA: Insufficient documentation

## 2023-07-04 DIAGNOSIS — M353 Polymyalgia rheumatica: Secondary | ICD-10-CM | POA: Diagnosis not present

## 2023-07-04 DIAGNOSIS — Z85118 Personal history of other malignant neoplasm of bronchus and lung: Secondary | ICD-10-CM | POA: Diagnosis not present

## 2023-07-04 DIAGNOSIS — J449 Chronic obstructive pulmonary disease, unspecified: Secondary | ICD-10-CM | POA: Insufficient documentation

## 2023-07-04 DIAGNOSIS — E039 Hypothyroidism, unspecified: Secondary | ICD-10-CM | POA: Diagnosis not present

## 2023-07-04 DIAGNOSIS — E785 Hyperlipidemia, unspecified: Secondary | ICD-10-CM | POA: Diagnosis not present

## 2023-07-04 DIAGNOSIS — M549 Dorsalgia, unspecified: Secondary | ICD-10-CM | POA: Diagnosis not present

## 2023-07-04 DIAGNOSIS — C3412 Malignant neoplasm of upper lobe, left bronchus or lung: Secondary | ICD-10-CM

## 2023-07-04 DIAGNOSIS — Z79899 Other long term (current) drug therapy: Secondary | ICD-10-CM | POA: Insufficient documentation

## 2023-07-04 DIAGNOSIS — I5032 Chronic diastolic (congestive) heart failure: Secondary | ICD-10-CM | POA: Diagnosis not present

## 2023-07-04 DIAGNOSIS — E782 Mixed hyperlipidemia: Secondary | ICD-10-CM

## 2023-07-04 DIAGNOSIS — Z8249 Family history of ischemic heart disease and other diseases of the circulatory system: Secondary | ICD-10-CM | POA: Diagnosis not present

## 2023-07-04 DIAGNOSIS — Z87891 Personal history of nicotine dependence: Secondary | ICD-10-CM | POA: Insufficient documentation

## 2023-07-04 NOTE — Patient Instructions (Signed)
 It was good to see you today!  If you receive a satisfaction survey regarding the Heart Failure Clinic, please take the time to fill it out. This way we can continue to provide excellent care and make any changes that need to be made.

## 2023-07-09 ENCOUNTER — Encounter

## 2023-07-11 ENCOUNTER — Encounter

## 2023-07-16 ENCOUNTER — Encounter

## 2023-07-18 ENCOUNTER — Encounter

## 2023-07-19 ENCOUNTER — Ambulatory Visit: Admitting: Primary Care

## 2023-07-23 ENCOUNTER — Encounter

## 2023-07-24 ENCOUNTER — Other Ambulatory Visit: Payer: Self-pay | Admitting: Primary Care

## 2023-07-24 DIAGNOSIS — E038 Other specified hypothyroidism: Secondary | ICD-10-CM

## 2023-07-25 ENCOUNTER — Encounter

## 2023-07-29 ENCOUNTER — Ambulatory Visit
Admission: RE | Admit: 2023-07-29 | Discharge: 2023-07-29 | Disposition: A | Discharge status: Home / Self Care | Payer: Medicare Other | Source: Ambulatory Visit | Attending: Radiation Oncology | Admitting: Radiation Oncology

## 2023-07-29 DIAGNOSIS — C3412 Malignant neoplasm of upper lobe, left bronchus or lung: Secondary | ICD-10-CM | POA: Insufficient documentation

## 2023-07-30 ENCOUNTER — Encounter

## 2023-08-01 ENCOUNTER — Encounter

## 2023-08-06 ENCOUNTER — Encounter

## 2023-08-08 ENCOUNTER — Encounter

## 2023-08-08 ENCOUNTER — Ambulatory Visit
Admission: RE | Admit: 2023-08-08 | Discharge: 2023-08-08 | Disposition: A | Discharge status: Home / Self Care | Payer: Medicare Other | Source: Ambulatory Visit | Attending: Radiation Oncology | Admitting: Radiation Oncology

## 2023-08-08 ENCOUNTER — Encounter: Payer: Self-pay | Admitting: Radiation Oncology

## 2023-08-08 ENCOUNTER — Other Ambulatory Visit: Payer: Self-pay | Admitting: *Deleted

## 2023-08-08 VITALS — BP 146/82 | HR 72 | Temp 98.2°F | Resp 20 | Wt 162.7 lb

## 2023-08-08 DIAGNOSIS — C3412 Malignant neoplasm of upper lobe, left bronchus or lung: Secondary | ICD-10-CM | POA: Diagnosis present

## 2023-08-08 DIAGNOSIS — R918 Other nonspecific abnormal finding of lung field: Secondary | ICD-10-CM | POA: Insufficient documentation

## 2023-08-08 DIAGNOSIS — R590 Localized enlarged lymph nodes: Secondary | ICD-10-CM | POA: Insufficient documentation

## 2023-08-08 DIAGNOSIS — Z923 Personal history of irradiation: Secondary | ICD-10-CM | POA: Diagnosis not present

## 2023-08-08 NOTE — Progress Notes (Signed)
 Radiation Oncology Follow up Note  Name: Teresa Jensen   Date:   08/08/2023 MRN:  [id number] DOB: 02-21-1945    This 78 y.o. female presents to the clinic today for 69-month follow-up status post SBRT to her left upper lobe for stage I squamous cell carcinoma.  REFERRING PROVIDER: Gretta Comer POUR, NP  HPI: Patient is a 78 year old female now out 16 months having completed SBRT to her left upper lobe for stage I squamous cell carcinoma seen today in routine follow-up she is doing well.  Pulmonary status is stable.  She continues on nasal oxygen.  She specifically denies cough hemoptysis or chest tightness or any significant change in her pulmonary status.  She is seeing Dr. Tamea for continued therapy.  She had a recent CT scan of her chest.  Showing increased consolidation in the anterior left upper lobe which in my review shows radiation changes.  She also has some consolidative groundglass in the lingula with new patchy clam grass again also favoring radiation changes.  She does have similar mildly enlarged mediastinal lymph nodes unchanged over time.  She has some other scattered tiny pulmonary nodules which was also stable.  COMPLICATIONS OF TREATMENT: none  FOLLOW UP COMPLIANCE: keeps appointments   PHYSICAL EXAM:  BP (!) 146/82   Pulse 72   Temp 98.2 F (36.8 C) (Tympanic)   Resp 20   Wt 162 lb 11.2 oz (73.8 kg)   BMI 35.21 kg/m patient is on nasal oxygen. Well-developed well-nourished patient in NAD. HEENT reveals PERLA, EOMI, discs not visualized.  Oral cavity is clear. No oral mucosal lesions are identified. Neck is clear without evidence of cervical or supraclavicular adenopathy. Lungs are clear to A&P. Cardiac examination is essentially unremarkable with regular rate and rhythm without murmur rub or thrill. Abdomen is benign with no organomegaly or masses noted. Motor sensory and DTR levels are equal and symmetric in the upper and lower extremities. Cranial nerves II  through XII are grossly intact. Proprioception is intact. No peripheral adenopathy or edema is identified. No motor or sensory levels are noted. Crude visual fields are within normal range.  RADIOLOGY RESULTS: Serial CT scans are reviewed compatible with above-stated findings  PLAN: At the present time patient is doing well.  Her chest CT is stable showing good response to therapy.  And pleased with her overall progress.  I have asked to see her back in 6 months for follow-up.  If CT scan is stable at that time we will turn follow-up care over to Dr. Tamea.  Patient comprehends my recommendations well.  I would like to take this opportunity to thank you for allowing me to participate in the care of your patient.SABRA Marcey Penton, MD

## 2023-08-13 ENCOUNTER — Encounter: Payer: Self-pay | Admitting: Primary Care

## 2023-08-13 ENCOUNTER — Ambulatory Visit: Payer: Self-pay | Admitting: Primary Care

## 2023-08-13 ENCOUNTER — Ambulatory Visit (INDEPENDENT_AMBULATORY_CARE_PROVIDER_SITE_OTHER): Payer: Medicare Other | Admitting: Primary Care

## 2023-08-13 ENCOUNTER — Encounter

## 2023-08-13 VITALS — BP 102/68 | HR 75 | Temp 97.3°F | Ht <= 58 in | Wt 163.0 lb

## 2023-08-13 DIAGNOSIS — K219 Gastro-esophageal reflux disease without esophagitis: Secondary | ICD-10-CM | POA: Diagnosis not present

## 2023-08-13 DIAGNOSIS — Z1231 Encounter for screening mammogram for malignant neoplasm of breast: Secondary | ICD-10-CM

## 2023-08-13 DIAGNOSIS — L539 Erythematous condition, unspecified: Secondary | ICD-10-CM

## 2023-08-13 DIAGNOSIS — R7303 Prediabetes: Secondary | ICD-10-CM

## 2023-08-13 DIAGNOSIS — E038 Other specified hypothyroidism: Secondary | ICD-10-CM

## 2023-08-13 DIAGNOSIS — I5032 Chronic diastolic (congestive) heart failure: Secondary | ICD-10-CM

## 2023-08-13 DIAGNOSIS — M810 Age-related osteoporosis without current pathological fracture: Secondary | ICD-10-CM

## 2023-08-13 DIAGNOSIS — I10 Essential (primary) hypertension: Secondary | ICD-10-CM

## 2023-08-13 DIAGNOSIS — J4489 Other specified chronic obstructive pulmonary disease: Secondary | ICD-10-CM | POA: Diagnosis not present

## 2023-08-13 DIAGNOSIS — M791 Myalgia, unspecified site: Secondary | ICD-10-CM | POA: Insufficient documentation

## 2023-08-13 LAB — TSH: TSH: 1.15 u[IU]/mL (ref 0.35–5.50)

## 2023-08-13 LAB — HEMOGLOBIN A1C: Hgb A1c MFr Bld: 5.4 % (ref 4.6–6.5)

## 2023-08-13 NOTE — Patient Instructions (Addendum)
 Stop by the lab prior to leaving today. I will notify you of your results once received.   Hold your atorvastatin  cholesterol pill for 2 weeks for muscle cramping.  Please update me at that time.  [facility] your [facility].   It was a pleasure to see you today!

## 2023-08-13 NOTE — Assessment & Plan Note (Signed)
 Appears euvolemic.   Following with CHF clinic, reviewed office notes from July 2025. Continue Farxiga  10 mg daily, furosemide  40 mg daily PRN, spironolactone  12.5 mg daily, losartan  100 mg daily

## 2023-08-13 NOTE — Assessment & Plan Note (Signed)
 She is taking levothyroxine  correctly. Continue levothyroxine  75 mg daily.   Repeat TSH pending.

## 2023-08-13 NOTE — Assessment & Plan Note (Signed)
 Follow with dermatology.  Continue Trental  400 mg twice daily, tacrolimus  0.1% cream.

## 2023-08-13 NOTE — Assessment & Plan Note (Signed)
 Stable.  Continue supplemental oxygen 1-2 liters 24 hours daily. Continue Bevespi  aerosphere 9-4.8 mcg, 2 puffs BID.

## 2023-08-13 NOTE — Progress Notes (Signed)
 Subjective:    Patient ID: Teresa Jensen, female    DOB: 02-20-1945, 78 y.o.   MRN: [id number]  HPI  Teresa Jensen is a very pleasant 78 y.o. female with a history of hypertension, COPD, CHF, hypothyroidism, osteoarthritis who presents today for follow up of chronic conditions.   1) Hypothyroidism: Currently managed on levothyroxine  75 mcg daily.  She is taking her levothyroxine  every morning on an empty stomach with water only.   No food or other medications for 30 minutes.   No heartburn medication, iron pills, calcium , vitamin D , or magnesium  pills within four hours of taking levothyroxine .   2) CHF/Hypertension/Hyperlipidemia: Currently managed on losartan  100 mg daily, atorvastatin  10 mg daily, furosemide  40 mg daily, Farxiga  10 mg daily, spironolactone  12.5 mg daily.  She continues to notice muscle cramping and pain. This is waking her from sleep at night. She denies chest pain, dizziness, headaches.   BP Readings from Last 3 Encounters:  08/13/23 102/68  08/08/23 (!) 146/82  07/04/23 (!) 112/58   3) COPD/Lung Cancer: Following with radiology oncology, last office visit and CT chest were in July and August 2025. Overall breathing is stable. She denies increased dyspnea. She does experience chronic mucous production, clear. Following with pulmonology from June 2025.     Review of Systems  Eyes:  Negative for visual disturbance.  Respiratory:  Positive for shortness of breath.   Cardiovascular:  Negative for chest pain.  Gastrointestinal:  Negative for constipation and diarrhea.  Neurological:  Negative for dizziness and headaches.  Psychiatric/Behavioral:  The patient is not nervous/anxious.          Past Medical History:  Diagnosis Date   (HFpEF) heart failure with preserved ejection fraction (HCC) 06/28/2011   a.) TTE 06/28/2011: EF 55-60%, mild AR, G1DD; b.) TTE 01/16/2013: EF 55-60%, mild MR; c.) TTE 02/16/2018: EF 60-65%, G1DD; d.) MPI 11/26/2018: EF 56%    Abnormal electrocardiogram 11/12/2018   Acute kidney failure (HCC) 01/12/2017   Acute on chronic respiratory failure with hypoxia (HCC) 09/06/2022   Adenoma of left adrenal gland    Anemia    Anxiety    Aortic atherosclerosis (HCC)    Bilateral carpal tunnel syndrome    CAD (coronary artery disease)    a.) HR CT chest 10/30/2021: 2 vessel CAD (LAD/RCA)   CAP (community acquired pneumonia)    COPD (chronic obstructive pulmonary disease) (HCC)    COPD exacerbation (HCC) 01/21/2018   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   Diverticulosis    Dyspnea    DUE TO HEAT WITH COPD   Epistaxis 01/09/2017   GERD (gastroesophageal reflux disease)    Herpes zoster 07/08/2015   Hypertension    Hypothyroidism    Iatrogenic hypotension 01/12/2017   Iliac artery stenosis, bilateral (HCC)    Morbid obesity with BMI of 40.0-44.9, adult (HCC)    New onset of headaches    Osteopenia    PMR (polymyalgia rheumatica) (HCC)    Pneumonia 02/13/2018   PONV (postoperative nausea and vomiting)    Pulmonary nodule, left    a.) CT chest 10/30/2021: 1.5 cm medial LUL nodule; b.) PET CT 11/21/2021: hypermetabolic LUL nodule (SUX max 9.3)   S/P carpal tunnel release 08/04/2020   Shingles    Stasis dermatitis    Strain of chest wall 01/20/2011   Tobacco abuse    Vitamin D  deficiency     Social History   Socioeconomic History   Marital status: Married    Spouse name:  [name]   Number of children: 1   Years of education: Not on file   Highest education level: 12th grade  Occupational History   Occupation: Catering manager as a Paramedic: RETIRED    Comment: retired  Tobacco Use   Smoking status: Former    Current packs/day: 0.00    Average packs/day: 1.5 packs/day for 20.0 years (30.0 ttl pk-yrs)    Types: Cigarettes    Start date: 02/11/1989    Quit date: 02/11/2009    Years since quitting: 14.5   Smokeless tobacco: Never  Vaping Use   Vaping status: Never Used  Substance  and Sexual Activity   Alcohol use: No    Alcohol/week: 0.0 standard drinks of alcohol   Drug use: No   Sexual activity: Not Currently  Other Topics Concern   Not on file  Social History Narrative   Lives in [place] with her husband.  Takes care of her two grandchildren, ages 53 and 2, everyday in [place].  Has one son who is 14.   Desires CPR   Would not want prolonged life support if futile   Social Drivers of Health   Financial Resource Strain: Patient Declined (05/01/2023)   Overall Financial Resource Strain (CARDIA)    Difficulty of Paying Living Expenses: Patient declined  Food Insecurity: Patient Declined (05/01/2023)   Hunger Vital Sign    Worried About Running Out of Food in the Last Year: Patient declined    Ran Out of Food in the Last Year: Patient declined  Transportation Needs: Patient Declined (05/01/2023)   PRAPARE - Administrator, Civil Service (Medical): Patient declined    Lack of Transportation (Non-Medical): Patient declined  Physical Activity: Sufficiently Active (05/01/2023)   Exercise Vital Sign    Days of Exercise per Week: 3 days    Minutes of Exercise per Session: 60 min  Stress: No Stress Concern Present (05/01/2023)   [employer] of Occupational Health - Occupational Stress Questionnaire    Feeling of Stress : Only a little  Social Connections: Unknown (05/01/2023)   Social Connection and Isolation Panel    Frequency of Communication with Friends and Family: More than three times a week    Frequency of Social Gatherings with Friends and Family: More than three times a week    Attends Religious Services: Patient declined    Active Member of Clubs or Organizations: Patient declined    Attends Banker Meetings: Not on file    Marital Status: Married  Intimate Partner Violence: Not At Risk (05/08/2023)   Humiliation, Afraid, Rape, and Kick questionnaire    Fear of Current or Ex-Partner: No    Emotionally Abused: No     Physically Abused: No    Sexually Abused: No    Past Surgical History:  Procedure Laterality Date   ANTERIOR CERVICAL DECOMP/DISCECTOMY FUSION  06/22/2004   C4-C6   APPENDECTOMY     CARPAL TUNNEL RELEASE Left 08/31/2019   Procedure: CARPAL TUNNEL RELEASE;  Surgeon: Mardee Lynwood SQUIBB, MD;  Location: [facility];  Service: Orthopedics;  Laterality: Left;   CARPAL TUNNEL RELEASE Right 06/22/2020   Procedure: CARPAL TUNNEL RELEASE;  Surgeon: Mardee Lynwood SQUIBB, MD;  Location: [facility];  Service: Orthopedics;  Laterality: Right;   CHOLECYSTECTOMY     COLONOSCOPY     ENDOBRONCHIAL ULTRASOUND Left 01/17/2022   Procedure: ENDOBRONCHIAL ULTRASOUND;  Surgeon: Tamea Dedra CROME, MD;  Location: [facility];  Service: Pulmonary;  Laterality: Left;  ESOPHAGOGASTRODUODENOSCOPY     HEMORRHOID SURGERY     TOTAL ABDOMINAL HYSTERECTOMY W/ BILATERAL SALPINGOOPHORECTOMY     TUBAL LIGATION      Family History  Problem Relation Age of Onset   Heart attack Mother 61   Cancer Father        lung/chest wall    Prostate cancer Father    Diabetes Brother        siblings   Hypertension Sister        x 2   Heart disease Brother    Breast cancer Paternal Grandmother    Heart disease Sister    Diabetes Sister    Tuberculosis Sister    Cirrhosis Brother     Allergies  Allergen Reactions   Epinephrine  Anaphylaxis and Shortness Of Breath   Food Rash    MANGO = cause rashes around the mouth LIPS GO NUMB   [employer] Rash    THIS IS MANGO LIPS GO NUMB   Other Rash    MANGO = cause rashes around the mouth MANGO = cause rashes around the mouth LIPS GO NUMB   Prednisone  Anaphylaxis, Hives, Shortness Of Breath, Itching and Swelling    The face swells HAS TAKEN CORTISONE SHOTS WITHOUT ISSUE BEFORE   Ivp Dye [Iodinated Contrast Media] Hives   Lactose Intolerance (Gi) Diarrhea   Pneumococcal 13-Val Conj Vacc Itching and Other (See Comments)    Arm temp elevated at site of injection, red, itching    Prevnar 13 [Pneumococcal 13-Val Conj Vacc] Other (See Comments)    Arm temp elevated at site of injection, red, itching    Current Outpatient Medications on File Prior to Visit  Medication Sig Dispense Refill   alendronate  (FOSAMAX ) 70 MG tablet Take 70 mg by mouth once a week. Take with a full glass of water on an empty stomach.     atorvastatin  (LIPITOR) 10 MG tablet TAKE 1 TABLET BY MOUTH DAILY. FOR CHOLESTEROL 90 tablet 1   Calcium  Carb-Cholecalciferol (CALCIUM  + VITAMIN D3 PO) Take 1,200 mg by mouth daily.     dapagliflozin propanediol  (FARXIGA ) 10 MG TABS tablet TAKE 1 TABLET BY MOUTH DAILY BEFORE BREAKFAST. 90 tablet 3   famotidine  (PEPCID ) 20 MG tablet TAKE 1 TABLET BY MOUTH AFTER SUPPER 90 tablet 3   furosemide  (LASIX ) 40 MG tablet Take 1 tablet (40 mg total) by mouth daily. 30 tablet 0   Glycopyrrolate -Formoterol  (BEVESPI  AEROSPHERE) 9-4.8 MCG/ACT AERO Inhale 2 puffs into the lungs 2 (two) times daily. 3 each 3   levothyroxine  (SYNTHROID ) 75 MCG tablet TAKE 1 EVERY AM ON AN EMPTY STOMACH WITH WATER ONLY. NO FOOD OR OTHER MEDICATIONS FOR 30 MINUTES. 90 tablet 0   losartan  (COZAAR ) 100 MG tablet TAKE 1 TABLET (100 MG TOTAL) BY MOUTH DAILY. PLEASE CALL [phone] TO SCHEDULE A YEARLY APPOINTMENT. THANK YOU. 90 tablet 3   Multiple Vitamins-Minerals (PRESERVISION AREDS 2 PO) Take 1 tablet by mouth in the morning and at bedtime.     pantoprazole  (PROTONIX ) 40 MG tablet Take 1 tablet (40 mg total) by mouth daily. for heartburn. 90 tablet 3   pentoxifylline  (TRENTAL ) 400 MG CR tablet Take 1 tablet (400 mg total) by mouth in the morning and at bedtime. 180 tablet 1   spironolactone  (ALDACTONE ) 25 MG tablet Take 0.5 tablets (12.5 mg total) by mouth daily. 45 tablet 3   tacrolimus  (PROTOPIC ) 0.1 % ointment Apply to aa's lower legs BID PRN. 60 g 5   No current facility-administered medications on  file prior to visit.    BP 102/68   Pulse 75   Temp (!) 97.3 F (36.3 C) (Temporal)   Ht 4'  9 (1.448 m)   Wt 163 lb (73.9 kg)   SpO2 95% Comment: 1 L of O2  BMI 35.27 kg/m  Objective:   Physical Exam Cardiovascular:     Rate and Rhythm: Normal rate and regular rhythm.  Pulmonary:     Effort: Pulmonary effort is normal.     Breath sounds: Normal breath sounds.  Musculoskeletal:     Cervical back: Neck supple.  Skin:    General: Skin is warm and dry.  Neurological:     Mental Status: She is alert and oriented to person, place, and time.  Psychiatric:        Mood and Affect: Mood normal.           Assessment & Plan:  Chronic heart failure with preserved ejection fraction (HFpEF) (HCC) Assessment & Plan: Appears euvolemic.   Following with CHF clinic, reviewed office notes from July 2025. Continue Farxiga  10 mg daily, furosemide  40 mg daily PRN, spironolactone  12.5 mg daily, losartan  100 mg daily   Essential hypertension Assessment & Plan: Well controlled.  Continue losartan  100 mg daily and spironolactone  12.5 mg daily. We discussed to start monitoring BP at home due to borderline low reading. Reviewed BMP from May 2025.   COPD Assessment & Plan: Stable.  Continue supplemental oxygen 1-2 liters 24 hours daily. Continue Bevespi  aerosphere 9-4.8 mcg, 2 puffs BID.   Gastroesophageal reflux disease, unspecified whether esophagitis present Assessment & Plan: Controlled.  Continue pantoprazole  40 mg daily.   Other specified hypothyroidism Assessment & Plan: She is taking levothyroxine  correctly. Continue levothyroxine  75 mg daily.   Repeat TSH pending.  Orders: -     TSH  Screening mammogram for breast cancer -     3D Screening Mammogram, Left and Right; Future  Osteoporosis, unspecified osteoporosis type, unspecified pathological fracture presence Assessment & Plan: Bone density scan UTD and improved.  Continue Fosamax  70 mg weekly.    Prediabetes Assessment & Plan: Repeat A1C pending.   Orders: -     Hemoglobin A1c  Erythema  of lower extremity Assessment & Plan: Follow with dermatology.  Continue Trental  400 mg twice daily, tacrolimus  0.1% cream.   Myalgia Assessment & Plan: Likely secondary to statin.  Hold atorvastatin  10 mg daily x 2 weeks.  She will update. Consider rosuvastatin or pravastatin.         Talise Sligh K Lenin Kuhnle, NP

## 2023-08-13 NOTE — Assessment & Plan Note (Signed)
 Likely secondary to statin.  Hold atorvastatin  10 mg daily x 2 weeks.  She will update. Consider rosuvastatin or pravastatin.

## 2023-08-13 NOTE — Assessment & Plan Note (Signed)
 Bone density scan UTD and improved.  Continue Fosamax  70 mg weekly.

## 2023-08-13 NOTE — Assessment & Plan Note (Signed)
 Well controlled.  Continue losartan  100 mg daily and spironolactone  12.5 mg daily. We discussed to start monitoring BP at home due to borderline low reading. Reviewed BMP from May 2025.

## 2023-08-13 NOTE — Assessment & Plan Note (Signed)
 Repeat A1C pending.

## 2023-08-13 NOTE — Assessment & Plan Note (Signed)
 Controlled.  Continue pantoprazole 40 mg daily.

## 2023-08-15 ENCOUNTER — Encounter

## 2023-08-16 ENCOUNTER — Other Ambulatory Visit: Payer: Self-pay | Admitting: Internal Medicine

## 2023-08-16 ENCOUNTER — Other Ambulatory Visit: Payer: Self-pay | Admitting: Primary Care

## 2023-08-16 DIAGNOSIS — I739 Peripheral vascular disease, unspecified: Secondary | ICD-10-CM

## 2023-08-20 ENCOUNTER — Encounter

## 2023-08-20 ENCOUNTER — Ambulatory Visit (INDEPENDENT_AMBULATORY_CARE_PROVIDER_SITE_OTHER)

## 2023-08-20 DIAGNOSIS — I872 Venous insufficiency (chronic) (peripheral): Secondary | ICD-10-CM

## 2023-08-20 DIAGNOSIS — M793 Panniculitis, unspecified: Secondary | ICD-10-CM

## 2023-08-20 NOTE — Progress Notes (Signed)
   Follow-Up Visit   Subjective  Teresa Jensen is a 78 y.o. female who presents for the following: stasis derm bil lower legs, burning, painful, tacrolimus  0.1% oint qd, Pentoxifylline  400mg  1 po bid, compression sleeves qd  Patient accompanied by husband.  The following portions of the chart were reviewed this encounter and updated as appropriate: medications, allergies, medical history  Review of Systems:  No other skin or systemic complaints except as noted in HPI or Assessment and Plan.  Objective  Well appearing patient in no apparent distress; mood and affect are within normal limits.   A focused examination was performed of the following areas: Lower legs  Relevant exam findings are noted in the Assessment and Plan.    Assessment & Plan   STASIS DERMATITIS / LIPODERMATOSCLEROSIS  Lower legs Exam: mild edema bil lower legs, erythematous rusty patch R > L ant lower extremities   Chronic and persistent condition with duration or expected duration over one year. Condition is symptomatic/ bothersome to patient. Not currently at goal.   Stasis in the legs causes chronic leg swelling, which may result in itchy or painful rashes, skin discoloration, skin texture changes, and sometimes ulceration.  Recommend daily graduated compression hose/stockings- easiest to put on first thing in morning, remove at bedtime.  Elevate legs as much as possible. Avoid salt/sodium rich foods.  Treatment Plan: Cont Compression socks daily Recommend elevating lower legs when sitting Start otc Sarna lotion Recommend discussing diuretic dose/frequency with Cardiology Cont Pentoxifylline  400mg  1 po bid - patient reports not much benefit with this. Advised ok to trial off medication and can restart if worsening  Cont Tacrolimus  0.1% oint qd    Return for to be scheduled for TBSE.  I, Grayce Saunas, RMA, am acting as scribe for Lauraine JAYSON Kanaris, MD .   Documentation: I have reviewed the above  documentation for accuracy and completeness, and I agree with the above.  Lauraine JAYSON Kanaris, MD

## 2023-08-20 NOTE — Patient Instructions (Addendum)
 Sarna lotion daily to legs    Due to recent changes in healthcare laws, you may see results of your pathology and/or laboratory studies on MyChart before the doctors have had a chance to review them. We understand that in some cases there may be results that are confusing or concerning to you. Please understand that not all results are received at the same time and often the doctors may need to interpret multiple results in order to provide you with the best plan of care or course of treatment. Therefore, we ask that you please give us  2 business days to thoroughly review all your results before contacting the office for clarification. Should we see a critical lab result, you will be contacted sooner.   If You Need Anything After Your Visit  If you have any questions or concerns for your doctor, please call our main line at [phone] and press option 4 to reach your doctor's medical assistant. If no one answers, please leave a voicemail as directed and we will return your call as soon as possible. Messages left after 4 pm will be answered the following business day.   You may also send us  a message via MyChart. We typically respond to MyChart messages within 1-2 business days.  For prescription refills, please ask your pharmacy to contact our office. Our fax number is [phone].  If you have an urgent issue when the clinic is closed that cannot wait until the next business day, you can Deveny your doctor at the number below.    Please note that while we do our best to be available for urgent issues outside of office hours, we are not available 24/7.   If you have an urgent issue and are unable to reach us , you may choose to seek medical care at your doctor's office, retail clinic, urgent care center, or emergency room.  If you have a medical emergency, please immediately call 911 or go to the emergency department.  Pager Numbers  - Dr. Hester: [phone]  - Dr. Jackquline:  [phone]  - Dr. Claudene: [phone]   - Dr. Raymund: [phone]  In the event of inclement weather, please call our main line at [phone] for an update on the status of any delays or closures.  Dermatology Medication Tips: Please keep the boxes that topical medications come in in order to help keep track of the instructions about where and how to use these. Pharmacies typically print the medication instructions only on the boxes and not directly on the medication tubes.   If your medication is too expensive, please contact our office at [phone] option 4 or send us  a message through MyChart.   We are unable to tell what your co-pay for medications will be in advance as this is different depending on your insurance coverage. However, we may be able to find a substitute medication at lower cost or fill out paperwork to get insurance to cover a needed medication.   If a prior authorization is required to get your medication covered by your insurance company, please allow us  1-2 business days to complete this process.  Drug prices often vary depending on where the prescription is filled and some pharmacies may offer cheaper prices.  The website www.goodrx.com contains coupons for medications through different pharmacies. The prices here do not account for what the cost may be with help from insurance (it may be cheaper with your insurance), but the website can give you the price if you did not use  any insurance.  - You can print the associated coupon and take it with your prescription to the pharmacy.  - You may also stop by our office during regular business hours and pick up a GoodRx coupon card.  - If you need your prescription sent electronically to a different pharmacy, notify our office through [facility] or by phone at [phone] option 4.     Si Usted Necesita Algo Despus de Su Visita  Tambin puede enviarnos un mensaje a travs de Clinical cytogeneticist. Por lo general  respondemos a los mensajes de MyChart en el transcurso de 1 a 2 das hbiles.  Para renovar recetas, por favor pida a su farmacia que se ponga en contacto con nuestra oficina. Randi [place] de fax es [place] [phone].  Si tiene un asunto urgente cuando la clnica est cerrada y que no puede esperar hasta el siguiente da hbil, puede llamar/localizar a su doctor(a) al nmero que aparece a continuacin.   Por favor, tenga en cuenta que aunque hacemos todo lo posible para estar disponibles para asuntos urgentes fuera del horario de [place], no estamos disponibles las 24 horas del da, los 7 [address]  [address] de la [place].   Si tiene un problema urgente y no puede comunicarse con nosotros, puede optar por buscar atencin mdica  en el consultorio de su doctor(a), en una clnica privada, en un centro de atencin urgente o en una sala de emergencias.  Si tiene Engineer, drilling, por favor llame inmediatamente al 911 o vaya a la sala de emergencias.  Nmeros de bper  - Dr. Hester: [phone]  - Dra. Jackquline: [phone]  - Dr. Claudene: [phone]   En caso de inclemencias del tiempo, por favor llame a landry capes principal al [phone] para una actualizacin sobre el [place] de cualquier retraso o cierre.  Consejos para la medicacin en dermatologa: Por favor, guarde las cajas en las que vienen los medicamentos de uso tpico para ayudarle a seguir las instrucciones sobre dnde y cmo usarlos. Las farmacias generalmente imprimen las instrucciones del medicamento slo en las cajas y no directamente en los tubos del Home.   Si su medicamento es muy caro, por favor, pngase en contacto con landry rieger llamando al [phone] y presione la opcin 4 o envenos un mensaje a travs de Clinical cytogeneticist.   No podemos decirle cul ser su copago por los medicamentos por adelantado ya que esto es diferente dependiendo de la cobertura de su seguro. Sin embargo, es posible que podamos encontrar un  medicamento sustituto a Audiological scientist un formulario para que el seguro cubra el medicamento que se considera necesario.   Si se requiere una autorizacin previa para que su compaa de seguros malta su medicamento, por favor permtanos de 1 a 2 das hbiles para completar este proceso.  Los precios de los medicamentos varan con frecuencia dependiendo del Environmental consultant de dnde se surte la receta y alguna farmacias pueden ofrecer precios ms baratos.  El sitio web www.goodrx.com tiene cupones para medicamentos de Health and safety inspector. Los precios aqu no tienen en cuenta lo que podra costar con la ayuda del seguro (puede ser ms barato con su seguro), pero el sitio web puede darle el precio si no utiliz Tourist information centre manager.  - Puede imprimir el cupn correspondiente y llevarlo con su receta a la farmacia.  - Tambin puede pasar por nuestra oficina durante el horario de atencin regular y Education officer, museum una tarjeta de cupones de GoodRx.  - Si necesita que su receta se enve electrnicamente  a [employer], informe a nuestra oficina a travs de MyChart de [place] o por telfono llamando al [phone] y presione la opcin 4.

## 2023-08-22 ENCOUNTER — Encounter

## 2023-09-03 DIAGNOSIS — I739 Peripheral vascular disease, unspecified: Secondary | ICD-10-CM

## 2023-09-11 ENCOUNTER — Ambulatory Visit: Admitting: Primary Care

## 2023-09-12 ENCOUNTER — Ambulatory Visit: Admitting: Dermatology

## 2023-09-12 ENCOUNTER — Ambulatory Visit

## 2023-09-12 DIAGNOSIS — L821 Other seborrheic keratosis: Secondary | ICD-10-CM

## 2023-09-12 DIAGNOSIS — Z1283 Encounter for screening for malignant neoplasm of skin: Secondary | ICD-10-CM | POA: Diagnosis not present

## 2023-09-12 DIAGNOSIS — L578 Other skin changes due to chronic exposure to nonionizing radiation: Secondary | ICD-10-CM

## 2023-09-12 DIAGNOSIS — L814 Other melanin hyperpigmentation: Secondary | ICD-10-CM | POA: Diagnosis not present

## 2023-09-12 DIAGNOSIS — I872 Venous insufficiency (chronic) (peripheral): Secondary | ICD-10-CM

## 2023-09-12 DIAGNOSIS — L820 Inflamed seborrheic keratosis: Secondary | ICD-10-CM

## 2023-09-12 DIAGNOSIS — D229 Melanocytic nevi, unspecified: Secondary | ICD-10-CM

## 2023-09-12 DIAGNOSIS — D1801 Hemangioma of skin and subcutaneous tissue: Secondary | ICD-10-CM | POA: Diagnosis not present

## 2023-09-12 DIAGNOSIS — W908XXA Exposure to other nonionizing radiation, initial encounter: Secondary | ICD-10-CM | POA: Diagnosis not present

## 2023-09-12 NOTE — Progress Notes (Signed)
 Subjective   Teresa Jensen is a 78 y.o. female who presents for the following: Total body skin exam for skin cancer screening and mole check. The patient has spots, moles and lesions to be evaluated, some may be new or changing and the patient may have concern these could be cancer.. Patient is established patient   Today patient reports: No new spots she has noticed.  Has ongoing rash on lower extremities, using cream and compression.   Review of Systems:    No other skin or systemic complaints except as noted in HPI or Assessment and Plan.  The following portions of the chart were reviewed this encounter and updated as appropriate: medications, allergies, medical history  Relevant Medical History:  Family history of skin cancer - Sister   Objective  Well appearing patient in no apparent distress; mood and affect are within normal limits. Examination was performed of the: Full Skin Examination: scalp, head, eyes, ears, nose, lips, neck, chest, axillae, abdomen, back, buttocks, bilateral upper extremities, bilateral lower extremities, hands, feet, fingers, toes, fingernails, and toenails.   Examination notable for: SKIN EXAM, Angioma(s): Scattered red vascular papule(s)  , Lentigo/lentigines: Scattered pigmented macules that are tan to brown in color and are somewhat non-uniform in shape and concentrated in the sun-exposed areas, Nevus/nevi: Scattered well-demarcated, regular, pigmented macule(s) and/or papule(s)  , Seborrheic Keratosis(es): Stuck-on appearing keratotic papule(s) on the trunk, some  irritated with redness, crusting, edema, and/or partial avulsion, Actinic Damage/Elastosis: chronic sun damage: dyspigmentation, telangiectasia, and wrinkling  - Woody induration of lower legs with marked hyperpigmentation and orange discoloration. No ulcerations Examination limited by: Shoes or socks  -   Left Lower Back x1 Stuck on waxy paps with erythema  Assessment & Plan   SKIN CANCER  SCREENING PERFORMED TODAY.  BENIGN SKIN FINDINGS  - Lentigines  - Seborrheic keratoses  - Hemangiomas   - Nevus/Multiple Benign Nevi  - Reassurance provided regarding the benign appearance of lesions noted on exam today; no treatment is indicated in the absence of symptoms/changes. - Reinforced importance of photoprotective strategies including liberal and frequent sunscreen use of a broad-spectrum SPF 30 or greater, use of protective clothing, and sun avoidance for prevention of cutaneous malignancy and photoaging.  Counseled patient on the importance of regular self-skin monitoring as well as routine clinical skin examinations as scheduled.   ACTINIC DAMAGE - Chronic condition, secondary to cumulative UV/sun exposure - Recommend daily broad spectrum sunscreen SPF 30+ to sun-exposed areas, reapply every 2 hours as needed.  - Staying in the shade or wearing long sleeves, sun glasses (UVA+UVB protection) and wide brim hats (4-inch brim around the entire circumference of the hat) are also recommended for sun protection.  - Call for new or changing lesions.  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 Condition is symptomatic and bothersome to patient. Patient is flaring and not currently at treatment goal.  - Informed the patient that this condition is caused by swelling in his legs and reducing this swelling is the ultimate treatment - Encouraged to elevate their legs for 1 hour three times a day - Continue compression stockings - Previously on pentoxifylline  - stopped as felt not much benefit. Advised can continue to hold.  - Cont Tacrolimus  0.1% oint qd   Level of service outlined above   Procedures, orders, diagnosis for this visit:  INFLAMED SEBORRHEIC KERATOSIS Left Lower Back x1 Symptomatic, irritating, patient would like  treated. Destruction of lesion - Left Lower Back x1 Complexity:  simple   Destruction method: cryotherapy   Informed consent: discussed and consent obtained   Timeout:  patient name, date of birth, surgical site, and procedure verified Lesion destroyed using liquid nitrogen: Yes   Region frozen until ice ball extended beyond lesion: Yes   Cryo cycles: 1 or 2. Outcome: patient tolerated procedure well with no complications   Post-procedure details: wound care instructions given   Additional details:  Prior to procedure, discussed risks of blister formation, small wound, skin dyspigmentation, or rare scar following cryotherapy. Recommend Vaseline ointment to treated areas while healing.    Inflamed seborrheic keratosis -     Destruction of lesion    Return to clinic: Return in about 1 year (around 09/11/2024) for TBSE, w/ Dr. Raymund.  Documentation:   I, Jacquelynn V. Wilfred, CMA, am acting as scribe for Lauraine JAYSON Raymund, MD .  I have reviewed the above documentation for accuracy and completeness, and I agree with the above.  Lauraine JAYSON Raymund, MD

## 2023-09-12 NOTE — Patient Instructions (Addendum)
 Cryosurgery  Cryosurgery ("freezing") uses liquid nitrogen to destroy certain types of skin lesions. Lowering the temperature of the lesion in a small area surrounding skin destroys the lesion. Immediately following cryosurgery, you will notice redness and swelling of the treatment area. Blistering or weeping may occur, lasting approximately one week which will then be followed by crusting. Most areas will heal completely in 10 to 14 days.  Wash the treated areas daily. Allow soap and water to run over the areas, but do not scrub. Should a scab or crust form, allow it to fall off on its own. Do not remove or pick at it. Application of an ointment  and a bandage may make you feel more comfortable, but it is not necessary. Some people develop an allergy to Neosporin , so we recommend that Vaseline or  Aquaphor be used.  The cryotherapy site will be more sensitive than your surrounding skin. Keep it covered, and remember to apply sunscreen every day to all your sun exposed skin. A scar may remain which is lighter or pinker than your normal skin. Your body will continue to improve your scar for up to one year; however a light-colored scar may remain.  Infection following cryotherapy is rare. However if you are worried about the appearance of the treated area, contact your doctor. We have a physician on call at all times. If you have any concerns about the site, please call our clinic at [phone]    Sunscreen  Who needs sunscreen? Everyone. Sunscreen use can help prevent skin cancer by protecting you from the sun's harmful ultraviolet rays. Anyone can get skin cancer, regardless of age, gender or race. In fact, it is estimated that one in five Americans will develop skin cancer in their lifetime.  Sunscreen alone cannot fully protect you. In addition to wearing sunscreen, dermatologists recommend taking the following steps to protect your skin and find skin cancer early:  Seek shade when appropriate,  remembering that the sun's rays are strongest between 10 a.m. and 2 p.m. If your shadow is shorter than you are, seek shade. Dress to protect yourself from the sun by wearing a lightweight long-sleeved shirt, pants, a wide-brimmed hat and sunglasses, when possible.  Use extra caution near water, snow and sand as they reflect the damaging rays of the sun, which can increase your chance of sunburn.  Get vitamin D  safely through a healthy diet that may include vitamin supplements. Don't seek the sun. Avoid tanning beds. Ultraviolet light from the sun and tanning beds can cause skin cancer and wrinkling. If you want to look tan, you may wish to use a self-tanning product, but continue to use sunscreen with it.  When should I use sunscreen? Every day you go outside--even if you're just walking to and from your form of transportation. The sun emits harmful UV rays year-round. Even on cloudy days, up to 80 percent of the sun's harmful UV rays can penetrate your skin. Snow, sand and water increase the need for sunscreen because they reflect the sun's rays.  How much sunscreen should I use, and how often should I apply it? Most people only apply 25-50 percent of the recommended amount of sunscreen. Apply enough sunscreen to cover all exposed skin. Most adults need about 1 ounce -- or enough to fill a shot glass -- to fully cover their body.  Don't forget to apply to the tops of your feet, your neck, your ears and the top of your head. Apply sunscreen to dry  skin 15 minutes before going outdoors.  Skin cancer also can form on the lips. To protect your lips, apply a lip balm or lipstick that contains sunscreen with an SPF of 30 or higher.  When outdoors, reapply sunscreen approximately every two hours, or after swimming or sweating, according to the directions on the bottle.   Broad-spectrum sunscreens protect against both UVA and UVB rays. What is the difference between the rays? Sunlight consists of two types  of harmful rays that reach the earth -- UVA rays and UVB rays. Overexposure to either can lead to skin cancer. In addition to causing skin cancer, here's what each of these rays do:  UVA rays (or aging rays) can prematurely age your skin, causing wrinkles and age spots, and can pass through window glass. UVB rays (or burning rays) are the primary cause of sunburn and are blocked by window glass  There is no safe way to tan. Every time you tan, you damage your skin. As this damage builds, you speed up the aging of your skin and increase your risk for all types of skin cancer.  What is the difference between chemical and physical sunscreens? Chemical sunscreens work like a sponge, absorbing the sun's rays. They contain one or more of the following active ingredients: oxybenzone, avobenzone, octisalate, octocrylene, homosalate and octinoxate. These formulations tend to be easier to rub into the skin without leaving a white residue.   Physical sunscreens work like a shield, sitting sit on the surface of your skin and deflecting the sun's rays. They contain the active ingredients zinc oxide and/or titanium dioxide. Use this sunscreen if you have sensitive skin.   What type of sunscreen should I use? The best type of sunscreen is the one you will use again and again. Just make sure it offers broad-spectrum (UVA and UVB) protection, has an SPF of 30+, and is water-resistant. The kind of sunscreen you use is a matter of personal choice, and may vary depending on the area of the body to be protected. Available sunscreen options include lotions, creams, gels, ointments, wax sticks and sprays.  Recommended physical sunscreens for face: - Neutrogena Sheer Zinc - Aveeno Positively Mineral Sensitive - CeraVe Hydrating Mineral (also has a tinted version) - La Roche-Posay Anthelios Mineral Face (comes as a cream, lotion, light fluid, and there is also a tinted version).  - EltaMD UV Clear (also has a tinted  version)  Recommended physical sunscreens for body: - Neutrogena Sheer Zinc Dry-Touch Sunscreen Sensitive Skin Lotion Broad Spectrum SPF 50 - Aveeno Positively Mineral Sensitive Skin Sunscreen Broad Spectrum SPF 50 - La Roche-Posay Anthelios SPF 50 Mineral Sunscreen - Gentle Lotion - CeraVe Hydrating Mineral Sunscreen SPF 50  Recommended chemical sunscreens for face: - Anthelios UV Correct Face Sunscreen SPF 70 with Niacinamide - Neutrogena Clear Face Oil-Free SPF 50 with Helioplex - Neutrogena Sport Face Oil-Free SPF 70+ with Helioplex - Aveeno Protect + Hydrate Sunscreen For Face SPF 70 - La Roche-Posay Anthelios Light Fluid Sunscreen for Face SPF 60   Melanoma ABCDEs  Melanoma is the most dangerous type of skin cancer, and is the leading cause of death from skin disease.  You are more likely to develop melanoma if you: Have light-colored skin, light-colored eyes, or red or blond hair Spend a lot of time in the sun Tan regularly, either outdoors or in a tanning bed Have had blistering sunburns, especially during childhood Have a close family member who has had a melanoma Have atypical moles  or large birthmarks  Early detection of melanoma is key since treatment is typically straightforward and cure rates are extremely high if we catch it early.   The first sign of melanoma is often a change in a mole or a new dark spot.  The ABCDE system is a way of remembering the signs of melanoma.  A for asymmetry:  The two halves do not match. B for border:  The edges of the growth are irregular. C for color:  A mixture of colors are present instead of an even brown color. D for diameter:  Melanomas are usually (but not always) greater than 6mm - the size of a pencil eraser. E for evolution:  The spot keeps changing in size, shape, and color.  Please check your skin once per month between visits. You can use a small mirror in front and a large mirror behind you to keep an eye on the back  side or your body.   If you see any new or changing lesions before your next follow-up, please call to schedule a visit.  Please continue daily skin protection including broad spectrum sunscreen SPF 30+ to sun-exposed areas, reapplying every 2 hours as needed when you're outdoors.   Staying in the shade or wearing long sleeves, sun glasses (UVA+UVB protection) and wide brim hats (4-inch brim around the entire circumference of the hat) are also recommended for sun protection.    Due to recent changes in healthcare laws, you may see results of your pathology and/or laboratory studies on MyChart before the doctors have had a chance to review them. We understand that in some cases there may be results that are confusing or concerning to you. Please understand that not all results are received at the same time and often the doctors may need to interpret multiple results in order to provide you with the best plan of care or course of treatment. Therefore, we ask that you please give us  2 business days to thoroughly review all your results before contacting the office for clarification. Should we see a critical lab result, you will be contacted sooner.   If You Need Anything After Your Visit  If you have any questions or concerns for your doctor, please call our main line at [phone] and press option 4 to reach your doctor's medical assistant. If no one answers, please leave a voicemail as directed and we will return your call as soon as possible. Messages left after 4 pm will be answered the following business day.   You may also send us  a message via MyChart. We typically respond to MyChart messages within 1-2 business days.  For prescription refills, please ask your pharmacy to contact our office. Our fax number is [phone].  If you have an urgent issue when the clinic is closed that cannot wait until the next business day, you can [place] your doctor at the number below.    Please note that  while we do our best to be available for urgent issues outside of office hours, we are not available 24/7.   If you have an urgent issue and are unable to reach us , you may choose to seek medical care at your doctor's office, retail clinic, urgent care center, or emergency room.  If you have a medical emergency, please immediately call 911 or go to the emergency department.  Pager Numbers  - Dr. Hester: [phone]  - Dr. Jackquline: [phone]  - Dr. Claudene: [phone]   In the event of  inclement weather, please call our main line at [phone] for an update on the status of any delays or closures.  Dermatology Medication Tips: Please keep the boxes that topical medications come in in order to help keep track of the instructions about where and how to use these. Pharmacies typically print the medication instructions only on the boxes and not directly on the medication tubes.   If your medication is too expensive, please contact our office at [phone] option 4 or send us  a message through MyChart.   We are unable to tell what your co-pay for medications will be in advance as this is different depending on your insurance coverage. However, we may be able to find a substitute medication at lower cost or fill out paperwork to get insurance to cover a needed medication.   If a prior authorization is required to get your medication covered by your insurance company, please allow us  1-2 business days to complete this process.  Drug prices often vary depending on where the prescription is filled and some pharmacies may offer cheaper prices.  The website www.goodrx.com contains coupons for medications through different pharmacies. The prices here do not account for what the cost may be with help from insurance (it may be cheaper with your insurance), but the website can give you the price if you did not use any insurance.  - You can print the associated coupon and take it with your  prescription to the pharmacy.  - You may also stop by our office during regular business hours and pick up a GoodRx coupon card.  - If you need your prescription sent electronically to a different pharmacy, notify our office through [facility] or by phone at [phone] option 4.     Si Usted Necesita Algo Despus de Su Visita  Tambin puede enviarnos un mensaje a travs de Clinical cytogeneticist. Por lo general respondemos a los mensajes de MyChart en el transcurso de 1 a 2 das hbiles.  Para renovar recetas, por favor pida a su farmacia que se ponga en contacto con nuestra oficina. Randi lakes de fax es [place] [phone].  Si tiene un asunto urgente cuando la clnica est cerrada y que no puede esperar hasta el siguiente da hbil, puede llamar/localizar a su doctor(a) al nmero que aparece a continuacin.   Por favor, tenga en cuenta que aunque hacemos todo lo posible para estar disponibles para asuntos urgentes fuera del horario de [place], no estamos disponibles las 24 horas del da, los 7 [address]  [address] de la [place].   Si tiene un problema urgente y no puede comunicarse con nosotros, puede optar por buscar atencin mdica  en el consultorio de su doctor(a), en una clnica privada, en un centro de atencin urgente o en una sala de emergencias.  Si tiene Engineer, drilling, por favor llame inmediatamente al 911 o vaya a la sala de emergencias.  Nmeros de bper  - Dr. Hester: [phone]  - Dra. Jackquline: [phone]  - Dr. Claudene: [phone]   En caso de inclemencias del tiempo, por favor llame a landry capes principal al [phone] para una actualizacin sobre el [place] de cualquier retraso o cierre.  Consejos para la medicacin en dermatologa: Por favor, guarde las cajas en las que vienen los medicamentos de uso tpico para ayudarle a seguir las instrucciones sobre dnde y cmo usarlos. Las farmacias generalmente imprimen las instrucciones del medicamento slo en las cajas y no  directamente en los tubos del [place].   Si su medicamento  es muy caro, por favor, pngase en contacto con landry rieger llamando al [phone] y presione la opcin 4 o envenos un mensaje a travs de Clinical cytogeneticist.   No podemos decirle cul ser su copago por los medicamentos por adelantado ya que esto es diferente dependiendo de la cobertura de su seguro. Sin embargo, es posible que podamos encontrar un medicamento sustituto a Audiological scientist un formulario para que el seguro cubra el medicamento que se considera necesario.   Si se requiere una autorizacin previa para que su compaa de seguros malta su medicamento, por favor permtanos de 1 a 2 das hbiles para completar este proceso.  Los precios de los medicamentos varan con frecuencia dependiendo del Environmental consultant de dnde se surte la receta y alguna farmacias pueden ofrecer precios ms baratos.  El sitio web www.goodrx.com tiene cupones para medicamentos de Health and safety inspector. Los precios aqu no tienen en cuenta lo que podra costar con la ayuda del seguro (puede ser ms barato con su seguro), pero el sitio web puede darle el precio si no utiliz Tourist information centre manager.  - Puede imprimir el cupn correspondiente y llevarlo con su receta a la farmacia.  - Tambin puede pasar por nuestra oficina durante el horario de atencin regular y Education officer, museum una tarjeta de cupones de GoodRx.  - Si necesita que su receta se enve electrnicamente a una farmacia diferente, informe a nuestra oficina a travs de MyChart de [place] o por telfono llamando al [phone] y presione la opcin 4.   Recommended chemical sunscreens for body: - Neutrogena Ultra Sheer Dry-Touch Sunscreen SPF 70 - Aveeno Protect + Hydrate Broad Spectrum All-Day Hydration SPF 60 (comes in a big pump) - La Roche-Posay Anthelios Melt-In Milk Sunscreen SPF 60

## 2023-09-23 ENCOUNTER — Encounter

## 2023-10-07 ENCOUNTER — Encounter: Payer: Self-pay | Admitting: Dermatology

## 2023-10-07 ENCOUNTER — Other Ambulatory Visit: Payer: Self-pay | Admitting: Dermatology

## 2023-10-07 ENCOUNTER — Telehealth: Payer: Self-pay

## 2023-10-07 NOTE — Telephone Encounter (Signed)
 Gave pt a call pt is coming up due for reenrollment [place] [employer]  ,spoke with pt is aware she will received an application in the mail and will faxed provider portion today.

## 2023-10-14 ENCOUNTER — Encounter: Payer: Self-pay | Admitting: Pulmonary Disease

## 2023-10-14 ENCOUNTER — Other Ambulatory Visit: Payer: Self-pay | Admitting: Dermatology

## 2023-10-14 ENCOUNTER — Ambulatory Visit: Admitting: Pulmonary Disease

## 2023-10-14 VITALS — BP 136/60 | HR 69 | Temp 97.9°F | Ht <= 58 in | Wt 168.6 lb

## 2023-10-14 DIAGNOSIS — J8410 Pulmonary fibrosis, unspecified: Secondary | ICD-10-CM

## 2023-10-14 DIAGNOSIS — J449 Chronic obstructive pulmonary disease, unspecified: Secondary | ICD-10-CM

## 2023-10-14 DIAGNOSIS — J9611 Chronic respiratory failure with hypoxia: Secondary | ICD-10-CM | POA: Diagnosis not present

## 2023-10-14 DIAGNOSIS — M793 Panniculitis, unspecified: Secondary | ICD-10-CM

## 2023-10-14 DIAGNOSIS — C3412 Malignant neoplasm of upper lobe, left bronchus or lung: Secondary | ICD-10-CM | POA: Diagnosis not present

## 2023-10-14 DIAGNOSIS — I872 Venous insufficiency (chronic) (peripheral): Secondary | ICD-10-CM

## 2023-10-14 MED ORDER — PENTOXIFYLLINE ER 400 MG PO TBCR: 400.0000 mg | EXTENDED_RELEASE_TABLET | Freq: Two times a day (BID) | ORAL | 5 refills | Status: AC

## 2023-10-14 NOTE — Progress Notes (Signed)
 Subjective:    Patient ID: Teresa Jensen, female    DOB: January 27, 1945, 78 y.o.   MRN: [id number]  Patient Care Team: Gretta Comer POUR, NP as PCP - General (Internal Medicine) End, Lonni, MD as PCP - Cardiology (Cardiology) Maree Jannett POUR, MD as Consulting Physician (Neurology) Jaye Fallow, MD as Referring Physician (Ophthalmology) [facility], [place] , MD (Inactive) as Consulting Physician (Dermatology) Tamea Dedra CROME, MD as Consulting Physician (Pulmonary Disease)  Chief Complaint  Patient presents with   COPD    Shortness of breath on exertion. Occasional cough.     BACKGROUND/INTERVAL:Patient is a 78 year old remote former smoker (30 PY) with a history as noted below who presents for follow-up on the issue of stage II COPD, postinflammatory pulmonary fibrosis and squamous cell carcinoma of the lung. Patient follows from her visit of 12 June 2023. This is a scheduled visit.  Could not complete pulmonary rehab program.  HPI Discussed the use of AI scribe software for clinical note transcription with the patient, who gave verbal consent to proceed.  History of Present Illness   Teresa Jensen is a 78 year old female with stage two moderate COPD and post-inflammatory pulmonary fibrosis who presents for follow-up of chronic respiratory failure.  She presents with her husband, Ubaldo.  She has been diagnosed with COPD and pulmonary fibrosis and uses supplemental oxygen. Her breathing is variable, with stress exacerbating her symptoms. She uses a battery-powered generator to ensure her oxygenator functions during power outages, which lasts for twelve hours before needing a recharge. She is currently using Bevespi  inhaler, which is due for renewal.  She does not tolerate inhaled corticosteroids and has no indication for them.  Doing well with LABA/LAMA combination.  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 This has  impacted her vision, adding to her stress.  She has not yet received her flu shot but plans to get it with her husband, declined getting it today.   Previously enrolled in pulmonary rehab but had to discontinue due to issues with the exercises and poor tolerance of same.  With regards to her squamous cell carcinoma of the left upper lobe she received SBRT and follows with radiation oncology.  Most recent CT chest was on 29 July 2022 showed evolving postradiation change.    DATA 06/2010 PFTs: FEV1 1.20 (56%), FVC 1.77 L or 63% predicted, FEV1/FVC 68%, significant airtrapping (RV 207%), DLCO 46% but nearly corrects by alveolar volume. 08/30/2021 PFTs: FEV1 0.95 L or 85% predicted, FVC 1.41 L or 71% predicted, FEV1/FVC 67%, normal lung volumes, no significant response.  Moderate diffusion capacity impairment which corrects to alveolar volume. 10/30/2021 CT chest high resolution: 1.5 cm spiculated solid left upper lobe pulmonary nodule suspicious for bronchogenic carcinoma.  No thoracic adenopathy.  Moderate patchy air trapping in both lungs indicative of small airways disease.  Scattered thin parenchymal bands mild patchy subpleural reticulation without bronchiectasis or honeycombing.  Mostly compatible with postinfectious/postinflammatory scarring.  Bilateral pleural thickening without pleural effusions. 11/21/2021 PET/CT: Hypermetabolic left upper lobe pulmonary nodule.  Mildly metabolic pericarinal lymph node, favored to be reactive.  No hypermetabolic left hilar lymph nodes.  No evidence of hypermetabolic distant metastatic disease. 01/12/2022 [facility] protocol CT: Slightly increased left upper lobe pulmonary nodule previously noted to be hypermetabolic.  Stable mild subcarinal lymphadenopathy, nonspecific.  Stable left adrenal adenoma. 01/17/2022 robotic bronchoscopy + EBUS: Left upper lobe nodule positive for squamous cell carcinoma.  TBNA negative for malignancy on  subcarinal nodes with sufficient  lymph node material consistent with sampling of lymph node. 05/07/2022: Ambulatory oximetry: Patient ambulated 250 feet and desaturated to 82%.  Placed on 2 L/min continue to desaturate to 87%.  Required 4 L/min to maintain 90 to 91% O2 sats. 08/09/2022 PFTs: FEV1 0.79 L or 50% predicted, FVC 1.34 L or 62% predicted, FEV1/FVC 59%, lung volumes normal with mild to moderate air trapping evident.  There is severe diffusion defect.  Significant bronchodilator response.  Related to severe obstructive airways disease.  Study performed in 2023 there has been modest decline in function. 08/19/2022 overnight oximetry on oxygen at 3 L/min: Showing desaturation events 45 with ODI of 15 O2 nadir 87%, instructed to increase oxygen to 3-1/2 L/min during sleep. 09/07/2022 echocardiogram: LVEF 55 to 60%, wall motion abnormalities, determinate diastolic parameters, normal right ventricular function.  Mildly elevated pulmonary artery systolic pressure.  Left atrial size mildly dilated.  Mild mitral valve regurgitation.  Mild aortic valve stenosis. 01/15/2023 CT chest without contrast: Previously treated lesion anteriorly in the left upper lobe has decreased in size.  No evidence of local recurrence or metastatic disease.  New patchy airspace disease more inferiorly in the lingula likely infectious/inflammatory.  No suspicious pulmonary nodularity.  Stable mildly enlarged mediastinal lymph nodes, likely reactive. 07/29/2023 CT chest without contrast: Increased consolidation of the anterior left upper lobe extending to the hilum consistent with evolving postradiation change.  Progressive consolidation/ground glass in the lingula also favored to be reflect evolving postradiation change.  Mildly prominent mediastinal lymph nodes unchanged from prior.  No new suspicious nodules or masses.  Review of Systems A 10 point review of systems was performed and it is as noted above otherwise negative.   Patient Active Problem List    Diagnosis Date Noted   Myalgia 08/13/2023   Skin breakdown 06/21/2023   Candidal intertrigo 03/21/2023   Urinary urgency 11/21/2022   Acute on chronic diastolic (congestive) heart failure (HCC) 09/06/2022   Allergic reaction to contrast dye 07/17/2022   Intermittent claudication 07/04/2022   Lipodermatosclerosis of both lower extremities 02/22/2022   Solitary pulmonary nodule 11/27/2021   Chronic right shoulder pain 08/04/2021   Lower extremity pain 08/04/2021   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   Morbid obesity (HCC) 05/01/2021   Foot callus 09/01/2020   Rash and nonspecific skin eruption 07/07/2020   History of colonic polyps 11/01/2019   Bilateral carpal tunnel syndrome 08/16/2019   Chronic heart failure with preserved ejection fraction (HFpEF) (HCC) 11/12/2018   DOE (dyspnea on exertion) 11/12/2018   Osteoporosis 12/16/2017   Low vitamin B12 level 01/13/2017   Hypokalemia 01/12/2017   Essential hypertension 01/08/2017   Closed fracture of metatarsal bone 09/28/2016   Prediabetes 10/17/2015   Pain of right lower extremity 05/20/2015   Medicare annual wellness visit, subsequent 10/12/2014   Occipital neuralgia 10/12/2014   Vitamin D  deficiency 10/12/2014   Erythema of lower extremity 09/08/2013   COPD GOLD 2  06/05/2010   Hypothyroidism 09/29/2009   Situational anxiety 10/01/2008   COPD 10/01/2008   GERD 10/01/2008   Osteoarthritis 10/01/2008    Social History   Tobacco Use   Smoking status: Former    Current packs/day: 0.00    Average packs/day: 1.5 packs/day for 20.0 years (30.0 ttl pk-yrs)    Types: Cigarettes    Start date: 02/11/1989    Quit date: 02/11/2009    Years since quitting: 14.6   Smokeless tobacco: Never  Substance Use Topics   Alcohol use: No  Alcohol/week: 0.0 standard drinks of alcohol    Allergies  Allergen Reactions   Epinephrine  Anaphylaxis and Shortness Of Breath   Food Rash    MANGO = cause rashes around the  mouth LIPS GO NUMB   [employer] Rash    THIS IS MANGO LIPS GO NUMB   Other Rash    MANGO = cause rashes around the mouth MANGO = cause rashes around the mouth LIPS GO NUMB   Prednisone  Anaphylaxis, Hives, Shortness Of Breath, Itching and Swelling    The face swells HAS TAKEN CORTISONE SHOTS WITHOUT ISSUE BEFORE   Ivp Dye [Iodinated Contrast Media] Hives   Lactose Intolerance (Gi) Diarrhea   Pneumococcal 13-Val Conj Vacc Itching and Other (See Comments)    Arm temp elevated at site of injection, red, itching   Prevnar 13 [Pneumococcal 13-Val Conj Vacc] Other (See Comments)    Arm temp elevated at site of injection, red, itching    Current Meds  Medication Sig   alendronate  (FOSAMAX ) 70 MG tablet Take 70 mg by mouth once a week. Take with a full glass of water on an empty stomach.   Calcium  Carb-Cholecalciferol (CALCIUM  + VITAMIN D3 PO) Take 1,200 mg by mouth daily.   dapagliflozin propanediol  (FARXIGA ) 10 MG TABS tablet TAKE 1 TABLET BY MOUTH DAILY BEFORE BREAKFAST.   famotidine  (PEPCID ) 20 MG tablet TAKE 1 TABLET BY MOUTH AFTER SUPPER   furosemide  (LASIX ) 40 MG tablet Take 1 tablet (40 mg total) by mouth daily.   Glycopyrrolate -Formoterol  (BEVESPI  AEROSPHERE) 9-4.8 MCG/ACT AERO Inhale 2 puffs into the lungs 2 (two) times daily.   levothyroxine  (SYNTHROID ) 75 MCG tablet TAKE 1 EVERY AM ON AN EMPTY STOMACH WITH WATER ONLY. NO FOOD OR OTHER MEDICATIONS FOR 30 MINUTES.   losartan  (COZAAR ) 100 MG tablet TAKE 1 TABLET (100 MG TOTAL) BY MOUTH DAILY. PLEASE CALL [phone] TO SCHEDULE A YEARLY APPOINTMENT. THANK YOU.   Multiple Vitamins-Minerals (PRESERVISION AREDS 2 PO) Take 1 tablet by mouth in the morning and at bedtime.   pantoprazole  (PROTONIX ) 40 MG tablet Take 1 tablet (40 mg total) by mouth daily. for heartburn.   pentoxifylline  (TRENTAL ) 400 MG CR tablet Take 1 tablet (400 mg total) by mouth in the morning and at bedtime.   spironolactone  (ALDACTONE ) 25 MG tablet Take 0.5  tablets (12.5 mg total) by mouth daily.   tacrolimus  (PROTOPIC ) 0.1 % ointment Apply to aa's lower legs BID PRN.    Immunization History  Administered Date(s) Administered   Fluad Quad(high Dose 65+) 09/02/2020, 10/12/2021   Fluad Trivalent(High Dose 65+) 10/19/2022   Influenza Whole 10/01/2008   Influenza,inj,Quad PF,6+ Mos 10/12/2014, 10/13/2015, 10/17/2016, 10/17/2017, 10/24/2018, 10/27/2019   PFIZER(Purple Top)SARS-COV-2 Vaccination 03/11/2019, 04/01/2019   Pneumococcal Conjugate-13 10/12/2014   Td 10/01/2008        Objective:     BP 136/60   Pulse 69   Temp 97.9 F (36.6 C) (Temporal)   Ht 4' 9 (1.448 m)   Wt 168 lb 9.6 oz (76.5 kg)   SpO2 96% Comment: 1L oxygen  BMI 36.48 kg/m   SpO2: 96 % (1L oxygen)  GENERAL: Obese woman, no acute distress.  Fully ambulatory, no conversational dyspnea. HEAD: Normocephalic, atraumatic.  EYES: Pupils equal, round, reactive to light.  No scleral icterus.  MOUTH: Dentition intact, oral mucosa moist. NECK: Supple. No thyromegaly. Trachea midline. No JVD.  No adenopathy. PULMONARY: Good air entry bilaterally.  Coarse, dry bibasilar crackles, no wheezes. CARDIOVASCULAR: S1 and S2. Regular rate and rhythm.  Grade 1/6 systolic ejection murmur LSB, no gallops or rubs noted. ABDOMEN: Obese, otherwise benign. MUSCULOSKELETAL: No joint deformity, no clubbing, no edema.  NEUROLOGIC: No overt focal deficit, no gait disturbance, speech is fluent. SKIN: Intact,warm,dry. PSYCH: Mood and behavior normal.   Assessment & Plan:     ICD-10-CM   1. Stage 2 moderate COPD by GOLD classification (HCC)  J44.9     2. Postinflammatory pulmonary fibrosis (HCC)  J84.10     3. Chronic respiratory failure with hypoxia (HCC)  J96.11     4. Squamous cell carcinoma of bronchus in left upper lobe (HCC)  C34.12      Discussion:    COPD with chronic respiratory failure, oxygen dependent and postinflammatory pulmonary fibrosis Chronic respiratory failure  secondary to stage two moderate COPD and postinflammatory pulmonary fibrosis. Oxygen dependent with variable breathing quality influenced by stress levels. Continues effective use of Bevespi  inhaler. Discussion about medication costs and potential reduction due to new healthcare deals. No recent flu vaccination. - Renew Bevespi  inhaler prescription via savings program. - Patient decided to postpone flu vaccine. - Follow up in four months.     Advised if symptoms do not improve or worsen, to please contact office for sooner follow up or seek emergency care.    I spent 30 minutes of dedicated to the care of this patient on the date of this encounter to include pre-visit review of records, face-to-face time with the patient discussing conditions above, post visit ordering of testing, clinical documentation with the electronic health record, making appropriate referrals as documented, and communicating necessary findings to members of the patients care team.     C. Leita Sanders, MD Advanced Bronchoscopy [facility]    *This note was generated using voice recognition software/Dragon and/or AI transcription program.  Despite best efforts to proofread, errors can occur which can change the meaning. Any transcriptional errors that result from this process are unintentional and may not be fully corrected at the time of dictation.

## 2023-10-14 NOTE — Patient Instructions (Signed)
 VISIT SUMMARY:  Today, we discussed your chronic respiratory issues, including COPD and pulmonary fibrosis, and reviewed your current treatment plan. We also talked about your recent diagnosis of macular degeneration and your plans to get a flu shot.  YOUR PLAN:  -COPD WITH CHRONIC RESPIRATORY FAILURE AND POSTINFLAMMATORY PULMONARY FIBROSIS: Chronic obstructive pulmonary disease (COPD) and pulmonary fibrosis are conditions that affect your lungs and make it hard to breathe. Your breathing can get worse with stress. You are using a Bevespi  inhaler, which helps open your airways, and you have a battery-powered generator for your oxygenator in case of power outages. We will renew your Bevespi  inhaler prescription and discuss any potential cost reductions due to new healthcare deals. Please get your flu shot as planned with your son.  -MACULAR DEGENERATION: Macular degeneration is an eye condition that affects your vision. You are receiving injections in both eyes to manage this condition. This has understandably added to your stress.  INSTRUCTIONS:  Please follow up in four months for a re-evaluation of your COPD and pulmonary fibrosis. Make sure to get your flu shot as planned.

## 2023-10-15 ENCOUNTER — Ambulatory Visit (INDEPENDENT_AMBULATORY_CARE_PROVIDER_SITE_OTHER)

## 2023-10-15 DIAGNOSIS — Z23 Encounter for immunization: Secondary | ICD-10-CM | POA: Diagnosis not present

## 2023-10-16 NOTE — Telephone Encounter (Signed)
 Pt call back had question if she needed to send income paper work along application,explain to pt she does not pt just need to added to the application.

## 2023-10-18 NOTE — Progress Notes (Unsigned)
 Cardiology Office Note    Date:  10/22/2023   ID:  Teresa Jensen, DOB 1945-12-30, MRN [id number]  PCP:  Gretta Comer POUR, NP  Cardiologist:  Lonni Hanson, MD  Electrophysiologist:  None   Chief Complaint: Follow up  History of Present Illness:   Teresa Jensen is a 78 y.o. 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  Echo from 06/2011 showed an EF of 55 to 60%, mild LVH, no regional wall motion, grade 1 diastolic dysfunction, and mild aortic insufficiency.  Echo from 01/2013 showed an EF of 55 to 60%, normal wall motion, normal LV diastolic function parameters, and mild aortic insufficiency.  Carotid artery ultrasound from 11/2017 showed 1 to 39% bilateral ICA stenosis with antegrade flow of the bilateral vertebral arteries and normal flow hemodynamics of the bilateral subclavian arteries.  She was admitted to the hospital in 02/2018 with pneumonia.  Echo during the admission showed an EF of 60 to 65%, diastolic dysfunction, normal RV systolic function and ventricular cavity size.  She subsequently established with Dr. Hanson in 11/2018 for evaluation of HFpEF.  Lexiscan  MPI in 11/2018 showed a small in size, moderate severity, fixed defect involving the apical anterior and apical segments consistent with artifact.  EF of 56%.  Aortic atherosclerosis without significant coronary artery calcification noted on CT imaging.  Overall, study was low risk.  Over the years, she has been followed by one of our other APP's and the [facility] CHF clinic.  She was diagnosed with stage I squamous cell carcinoma of the left lung in 01/2022 and underwent SBRT.  PFTs  in 08/2022 showing largely stable COPD.  ABIs in 09/2022 0.98 on the right and 0.93 on the left with abnormal bilateral brachial artery waveforms suggestive of more significant PAD.  Echo in 09/2022 showed an EF of 55 to 60%, no regional wall motion abnormalities, normal RV systolic function and ventricular cavity size, mildly elevated RVSP estimated at 39.6 mmHg, mildly dilated left atrium, mild mitral regurgitation, and mild aortic stenosis with a mean gradient of 10 mmHg.  She comes in doing well from a cardiac perspective and is without symptoms of angina or cardiac decompensation.  She presents with her husband.  Chronic dyspnea is stable on supplemental oxygen.  No significant lower extremity swelling or progressive orthopnea.  No dizziness, presyncope, or syncope.  No falls or symptoms concerning for bleeding.  Adherent to cardiac pharmacotherapy without off target effect.  Reports that she does not believe that she will still be alive 12 months from now, indicating this is a premonition.  She is without suicidal or homicidal ideation.  She is not worried about this premonition.  Her weight is down 3 pounds today when compared to visit from 10/2022.  Overall, she feels well from a cardiac perspective and does not have any acute cardiac concerns at this time.   Labs independently reviewed: 08/2023 - TSH normal, A1c 5.4 05/2023 - potassium 4.6, BUN 40, serum creatinine 1.04, TC 124, TG 71, HDL 60, LDL 50 09/2022 - magnesium  1.7, Hgb 12.3, PLT 319 05/2022 - albumin 4.0, AST/ALT normal  Past Medical History:  Diagnosis Date   (HFpEF) heart failure with preserved ejection fraction (HCC) 06/28/2011  a.) TTE 06/28/2011: EF 55-60%, mild AR, G1DD; b.) TTE 01/16/2013: EF 55-60%, mild MR; c.) TTE 02/16/2018: EF 60-65%, G1DD; d.) MPI 11/26/2018: EF 56%   Abnormal electrocardiogram 11/12/2018   Acute kidney failure 01/12/2017   Acute on chronic respiratory failure with hypoxia (HCC) 09/06/2022   Adenoma of  left adrenal gland    Anemia    Anxiety    Aortic atherosclerosis    Bilateral carpal tunnel syndrome    CAD (coronary artery disease)    a.) HR CT chest 10/30/2021: 2 vessel CAD (LAD/RCA)   CAP (community acquired pneumonia)    COPD (chronic obstructive pulmonary disease) (HCC)    COPD exacerbation (HCC) 01/21/2018   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   Diverticulosis    Dyspnea    DUE TO HEAT WITH COPD   Epistaxis 01/09/2017   GERD (gastroesophageal reflux disease)    Herpes zoster 07/08/2015   Hypertension    Hypothyroidism    Iatrogenic hypotension 01/12/2017   Iliac artery stenosis, bilateral    Morbid obesity with BMI of 40.0-44.9, adult (HCC)    New onset of headaches    Osteopenia    PMR (polymyalgia rheumatica)    Pneumonia 02/13/2018   PONV (postoperative nausea and vomiting)    Pulmonary nodule, left    a.) CT chest 10/30/2021: 1.5 cm medial LUL nodule; b.) PET CT 11/21/2021: hypermetabolic LUL nodule (SUX max 9.3)   S/P carpal tunnel release 08/04/2020   Shingles    Stasis dermatitis    Strain of chest wall 01/20/2011   Tobacco abuse    Vitamin D  deficiency     Past Surgical History:  Procedure Laterality Date   ANTERIOR CERVICAL DECOMP/DISCECTOMY FUSION  06/22/2004   C4-C6   APPENDECTOMY     CARPAL TUNNEL RELEASE Left 08/31/2019   Procedure: CARPAL TUNNEL RELEASE;  Surgeon: Mardee Lynwood SQUIBB, MD;  Location: [facility] ORS;  Service: Orthopedics;  Laterality: Left;   CARPAL TUNNEL RELEASE Right 06/22/2020   Procedure: CARPAL TUNNEL RELEASE;  Surgeon: Mardee Lynwood SQUIBB, MD;  Location: [facility] ORS;  Service: Orthopedics;  Laterality: Right;   CHOLECYSTECTOMY     COLONOSCOPY     ENDOBRONCHIAL ULTRASOUND Left 01/17/2022   Procedure: ENDOBRONCHIAL ULTRASOUND;  Surgeon: Tamea Dedra CROME, MD;  Location: [facility] ORS;  Service: Pulmonary;  Laterality: Left;   ESOPHAGOGASTRODUODENOSCOPY     HEMORRHOID SURGERY     TOTAL ABDOMINAL HYSTERECTOMY W/ BILATERAL  SALPINGOOPHORECTOMY     TUBAL LIGATION      Current Medications: Current Meds  Medication Sig   alendronate  (FOSAMAX ) 70 MG tablet Take 70 mg by mouth once a week. Take with a full glass of water on an empty stomach.   atorvastatin  (LIPITOR) 10 MG tablet TAKE 1 TABLET BY MOUTH EVERY DAY FOR CHOLESTEROL   Calcium  Carb-Cholecalciferol (CALCIUM  + VITAMIN D3 PO) Take 1,200 mg by mouth daily.   dapagliflozin propanediol  (FARXIGA ) 10 MG TABS tablet TAKE 1 TABLET BY MOUTH DAILY BEFORE BREAKFAST.   famotidine  (PEPCID ) 20 MG tablet TAKE 1 TABLET BY MOUTH AFTER SUPPER   furosemide  (LASIX ) 40 MG tablet Take 1 tablet (40 mg total) by mouth daily.   Glycopyrrolate -Formoterol  (BEVESPI  AEROSPHERE) 9-4.8 MCG/ACT AERO Inhale 2 puffs into the lungs 2 (two) times daily.   levothyroxine  (SYNTHROID ) 75 MCG tablet TAKE 1 TABLET BY MOUTH EVERY MORNING ON EMPTY STOMACH WITH WATER ONLY,NO FOOD OR OTHER MED X30MINUTES   losartan  (COZAAR ) 100 MG tablet TAKE 1 TABLET (100 MG TOTAL) BY MOUTH DAILY. PLEASE CALL  [phone] TO SCHEDULE A YEARLY APPOINTMENT. THANK YOU.   Multiple Vitamins-Minerals (PRESERVISION AREDS 2 PO) Take 1 tablet by mouth in the morning and at bedtime.   pantoprazole  (PROTONIX ) 40 MG tablet Take 1 tablet (40 mg total) by mouth daily. for heartburn.   pentoxifylline  (TRENTAL ) 400 MG CR tablet Take 1 tablet (400 mg total) by mouth in the morning and at bedtime.   spironolactone  (ALDACTONE ) 25 MG tablet Take 0.5 tablets (12.5 mg total) by mouth daily.   tacrolimus  (PROTOPIC ) 0.1 % ointment Apply to aa's lower legs BID PRN.    Allergies:   Epinephrine , Food, Mangifera indica, Other, Prednisone , Ivp dye [iodinated contrast media], Lactose intolerance (gi), Pneumococcal 13-val conj vacc, and Prevnar 13 [pneumococcal 13-val conj vacc]   Social History   Socioeconomic History   Marital status: Married    Spouse name: [name]   Number of children: 1   Years of education: Not on file   Highest education  level: 12th grade  Occupational History   Occupation: Catering manager as a Paramedic: RETIRED    Comment: retired  Tobacco Use   Smoking status: Former    Current packs/day: 0.00    Average packs/day: 1.5 packs/day for 20.0 years (30.0 ttl pk-yrs)    Types: Cigarettes    Start date: 02/11/1989    Quit date: 02/11/2009    Years since quitting: 14.7   Smokeless tobacco: Never  Vaping Use   Vaping status: Never Used  Substance and Sexual Activity   Alcohol use: No    Alcohol/week: 0.0 standard drinks of alcohol   Drug use: No   Sexual activity: Not Currently  Other Topics Concern   Not on file  Social History Narrative   Lives in [place] with her husband.  Takes care of her two grandchildren, ages 99 and 2, everyday in [place].  Has one son who is 56.   Desires CPR   Would not want prolonged life support if futile   Social Drivers of Health   Financial Resource Strain: Patient Declined (05/01/2023)   Overall Financial Resource Strain (CARDIA)    Difficulty of Paying Living Expenses: Patient declined  Food Insecurity: Patient Declined (05/01/2023)   Hunger Vital Sign    Worried About Running Out of Food in the Last Year: Patient declined    Ran Out of Food in the Last Year: Patient declined  Transportation Needs: Patient Declined (05/01/2023)   PRAPARE - Administrator, Civil Service (Medical): Patient declined    Lack of Transportation (Non-Medical): Patient declined  Physical Activity: Sufficiently Active (05/01/2023)   Exercise Vital Sign    Days of Exercise per Week: 3 days    Minutes of Exercise per Session: 60 min  Stress: No Stress Concern Present (05/01/2023)   [employer] of Occupational Health - Occupational Stress Questionnaire    Feeling of Stress : Only a little  Social Connections: Unknown (05/01/2023)   Social Connection and Isolation Panel    Frequency of Communication with Friends and Family: More than three times a week     Frequency of Social Gatherings with Friends and Family: More than three times a week    Attends Religious Services: Patient declined    Database administrator or Organizations: Patient declined    Attends Engineer, structural: Not on file    Marital Status: Married     Family History:  The patient's family history includes Breast cancer in her paternal grandmother;  Cancer in her father; Cirrhosis in her brother; Diabetes in her brother and sister; Heart attack (age of onset: 77) in her mother; Heart disease in her brother and sister; Hypertension in her sister; Prostate cancer in her father; Tuberculosis in her sister.  ROS:   12-point review of systems is negative unless otherwise noted in the HPI.   EKGs/Labs/Other Studies Reviewed:    Studies reviewed were summarized above. The additional studies were reviewed today:  2D echo 09/07/2022: 1. Left ventricular ejection fraction, by estimation, is 55 to 60%. Left  ventricular ejection fraction by 2D MOD biplane is 55.6 %. The left  ventricle has normal function. The left ventricle has no regional wall  motion abnormalities. Left ventricular  diastolic parameters are indeterminate.   2. Right ventricular systolic function is normal. The right ventricular  size is normal. There is mildly elevated pulmonary artery systolic  pressure.   3. Left atrial size was mildly dilated.   4. The mitral valve is normal in structure. Mild mitral valve  regurgitation.   5. The aortic valve is calcified. Aortic valve regurgitation is not  visualized. Mild aortic valve stenosis. Aortic valve mean gradient  measures 10.5 mmHg.   6. The inferior vena cava is normal in size with <50% respiratory  variability, suggesting right atrial pressure of 8 mmHg.  __________  Lexiscan  MPI 11/26/2018: Low risk, probably normal pharmacologic myocardial perfusion stress test. There is a small in size, mild in severity, fixed defect involving the apical  anterior and apical segments. This is most consistent with artifact (attenuation and apical thinning) but cannot rule out subtle scar. There is no significant ischemia. The left ventricular ejection fraction is normal (56%). There is no significant coronary artery calcification. Aortic atherosclerosis is noted on the attenuation correction CT. This is a low risk study. __________  2D echo 02/16/2018: 1. The left ventricle has normal systolic function with an ejection  fraction of 60-65%. The cavity size was normal. Left ventricular diastolic  Doppler parameters are consistent with impaired relaxation.   2. The right ventricle has normal systolic function. The cavity was  normal. There is no increase in right ventricular wall thickness.   3. Right atrial pressure is estimated at 10 mmHg.   4. Unable to estimate RVSP  __________  2D echo 01/16/2013: - Left ventricle: The cavity size was normal. Systolic    function was normal. The estimated ejection fraction was    in the range of 55% to 60%. Wall motion was normal; there    were no regional wall motion abnormalities. Left    ventricular diastolic function parameters were normal.  - Aortic valve: Mild regurgitation.  __________  2D echo 06/28/2011: - Left ventricle: The cavity size was normal. Wall thickness    was increased in a pattern of mild LVH. Systolic function    was normal. The estimated ejection fraction was in the    range of 55% to 60%. Wall motion was normal; there were no    regional wall motion abnormalities. Doppler parameters are    consistent with abnormal left ventricular relaxation    (grade 1 diastolic dysfunction).  - Aortic valve: Mild regurgitation.     EKG:  EKG is ordered today.  The EKG ordered today demonstrates sinus rhythm with sinus arrhythmia, 82 bpm, no acute st/t changes  Recent Labs: 05/30/2023: BUN 40; Creatinine, Ser 1.04; Potassium 4.6; Sodium 138 08/13/2023: TSH 1.15  Recent Lipid Panel     Component Value Date/Time  CHOL 124 05/30/2023 0852   TRIG 71 05/30/2023 0852   HDL 60 05/30/2023 0852   CHOLHDL 2.1 05/30/2023 0852   VLDL 14 05/30/2023 0852   LDLCALC 50 05/30/2023 0852    PHYSICAL EXAM:    VS:  BP (!) 140/60 (BP Location: Left Arm, Patient Position: Sitting, Cuff Size: Large)   Pulse 82 Comment: 80 oximeter  Ht 4' 9 (1.448 m)   Wt 168 lb 3.2 oz (76.3 kg)   SpO2 97%   BMI 36.40 kg/m   BMI: Body mass index is 36.4 kg/m.  Physical Exam Vitals reviewed.  Constitutional:      Appearance: She is well-developed.  HENT:     Head: Normocephalic and atraumatic.  Eyes:     General:        Right eye: No discharge.        Left eye: No discharge.  Cardiovascular:     Rate and Rhythm: Normal rate and regular rhythm.     Heart sounds: S1 normal and S2 normal. Heart sounds not distant. No midsystolic click and no opening snap. Murmur heard.     Systolic murmur is present with a grade of 1/6 at the upper right sternal border.     No friction rub.  Pulmonary:     Effort: Pulmonary effort is normal. No respiratory distress.     Breath sounds: Normal breath sounds. No decreased breath sounds, wheezing, rhonchi or rales.     Comments: Supplemental oxygen via nasal cannula noted.  Musculoskeletal:     Cervical back: Normal range of motion.     Right lower leg: No edema.     Left lower leg: No edema.  Skin:    General: Skin is warm and dry.     Nails: There is no clubbing.  Neurological:     Mental Status: She is alert and oriented to person, place, and time.  Psychiatric:        Speech: Speech normal.        Behavior: Behavior normal.        Thought Content: Thought content normal.        Judgment: Judgment normal.     Wt Readings from Last 3 Encounters:  10/22/23 168 lb 3.2 oz (76.3 kg)  10/14/23 168 lb 9.6 oz (76.5 kg)  08/13/23 163 lb (73.9 kg)     ASSESSMENT & PLAN:   HFpEF/pulmonary hypertension: Euvolemic and well compensated on furosemide  40 mg  daily, spironolactone  12.5 mg daily, and Farxiga  10 mg.  Recent labs stable.  HTN: Blood pressure is reasonably controlled in the office today.  She remains on losartan  100 mg and spironolactone  12.5 mg.  Aortic atherosclerosis/HLD: Prior Lexiscan  MPI in 11/2018 showed no evidence of coronary artery calcification with aortic atherosclerosis noted.  LDL 50 in 05/2023.  She remains on atorvastatin  10 mg.  Chronic hypoxic respiratory failure with underlying COPD, postinflammatory pulmonary fibrosis, and squamous cell carcinoma of the left lung: Chronic dyspnea overall stable.  Ongoing management per pulmonology.  PAD: Updated ABIs and lower extremity arterial ultrasound performed earlier this morning and are pending at this time.  Consider addition of ASA 81 mg.  Remains on atorvastatin .  Based on trend, consider referral to Dr. Darron.  Continue with regular walking regimen.    Disposition: F/u with Dr. Mady or an APP in 6 months.   Medication Adjustments/Labs and Tests Ordered: Current medicines are reviewed at length with the patient today.  Concerns regarding medicines are outlined above. Medication  changes, Labs and Tests ordered today are summarized above and listed in the Patient Instructions accessible in Encounters.   Signed, Bernardino Bring, PA-C 10/22/2023 11:24 AM     [facility] [place] [address] [place], [name] [address] [phone]

## 2023-10-20 ENCOUNTER — Other Ambulatory Visit: Payer: Self-pay | Admitting: Primary Care

## 2023-10-20 DIAGNOSIS — E038 Other specified hypothyroidism: Secondary | ICD-10-CM

## 2023-10-21 ENCOUNTER — Other Ambulatory Visit (HOSPITAL_COMMUNITY): Payer: Self-pay

## 2023-10-21 NOTE — Telephone Encounter (Signed)
 Received pt portion pap [employer] [facility]  today pt has been approve for 2026 re-enrollment thru 12/31/2024.approval letter index

## 2023-10-22 ENCOUNTER — Ambulatory Visit (INDEPENDENT_AMBULATORY_CARE_PROVIDER_SITE_OTHER): Admitting: Physician Assistant

## 2023-10-22 ENCOUNTER — Ambulatory Visit (INDEPENDENT_AMBULATORY_CARE_PROVIDER_SITE_OTHER)

## 2023-10-22 ENCOUNTER — Encounter

## 2023-10-22 ENCOUNTER — Ambulatory Visit: Attending: Cardiovascular Disease

## 2023-10-22 ENCOUNTER — Encounter: Payer: Self-pay | Admitting: Physician Assistant

## 2023-10-22 VITALS — BP 140/60 | HR 82 | Ht <= 58 in | Wt 168.2 lb

## 2023-10-22 DIAGNOSIS — Z85118 Personal history of other malignant neoplasm of bronchus and lung: Secondary | ICD-10-CM | POA: Diagnosis present

## 2023-10-22 DIAGNOSIS — I5032 Chronic diastolic (congestive) heart failure: Secondary | ICD-10-CM

## 2023-10-22 DIAGNOSIS — J9611 Chronic respiratory failure with hypoxia: Secondary | ICD-10-CM | POA: Diagnosis present

## 2023-10-22 DIAGNOSIS — E785 Hyperlipidemia, unspecified: Secondary | ICD-10-CM | POA: Insufficient documentation

## 2023-10-22 DIAGNOSIS — I10 Essential (primary) hypertension: Secondary | ICD-10-CM | POA: Insufficient documentation

## 2023-10-22 DIAGNOSIS — J8410 Pulmonary fibrosis, unspecified: Secondary | ICD-10-CM

## 2023-10-22 DIAGNOSIS — I700 Atherosclerosis of aorta: Secondary | ICD-10-CM | POA: Insufficient documentation

## 2023-10-22 DIAGNOSIS — I272 Pulmonary hypertension, unspecified: Secondary | ICD-10-CM | POA: Diagnosis present

## 2023-10-22 DIAGNOSIS — I739 Peripheral vascular disease, unspecified: Secondary | ICD-10-CM | POA: Insufficient documentation

## 2023-10-22 DIAGNOSIS — J42 Unspecified chronic bronchitis: Secondary | ICD-10-CM

## 2023-10-22 NOTE — Patient Instructions (Addendum)
 Medication Instructions:   Your physician recommends that you continue on your current medications as directed. Please refer to the Current Medication list given to you today.    *If you need a refill on your cardiac medications before your next appointment, please call your pharmacy*  Lab Work:  None ordered at this time   If you have labs (blood work) drawn today and your tests are completely normal, you will receive your results only by:  MyChart Message (if you have MyChart) OR  A paper copy in the mail If you have any lab test that is abnormal or we need to change your treatment, we will call you to review the results.  Testing/Procedures:  None ordered at this time   Referrals:  None ordered at this time   Follow-Up:  At [facility], you and your health needs are our priority.  As part of our continuing mission to provide you with exceptional heart care, our providers are all part of one team.  This team includes your primary Cardiologist (physician) and Advanced Practice Providers or APPs (Physician Assistants and Nurse Practitioners) who all work together to provide you with the care you need, when you need it.  Your next appointment:   5 - 6 month(s)  Provider:    Lonni Hanson, MD or Bernardino Bring, PA-C    We recommend signing up for the patient portal called MyChart.  Sign up information is provided on this After Visit Summary.  MyChart is used to connect with patients for Virtual Visits (Telemedicine).  Patients are able to view lab/test results, encounter notes, upcoming appointments, etc.  Non-urgent messages can be sent to your provider as well.   To learn more about what you can do with MyChart, go to [email or web address].

## 2023-10-23 ENCOUNTER — Ambulatory Visit: Payer: Self-pay | Admitting: Primary Care

## 2023-10-23 DIAGNOSIS — I739 Peripheral vascular disease, unspecified: Secondary | ICD-10-CM

## 2023-10-23 LAB — VAS US ABI WITH/WO TBI
Left ABI: 0.71
Right ABI: 0.91

## 2023-10-23 NOTE — Telephone Encounter (Signed)
 Received provider portion today faxed to [employer] along pt portion.

## 2023-10-29 ENCOUNTER — Ambulatory Visit (INDEPENDENT_AMBULATORY_CARE_PROVIDER_SITE_OTHER): Admitting: Podiatry

## 2023-10-29 DIAGNOSIS — Q828 Other specified congenital malformations of skin: Secondary | ICD-10-CM

## 2023-10-29 NOTE — Progress Notes (Signed)
 Subjective:  Patient ID: Teresa Jensen, female    DOB: 1945-01-05,  MRN: [id number]  Chief Complaint  Patient presents with   Toe Pain    Left foot 5th toe     78 y.o. female presents with the above complaint.  Patient presents with left submetatarsal 5 porokeratotic lesion painful to touch is progressing and worse worse with ambulation and shoe pressure has not seen MRIs prior to seeing me denies any other acute complaints pain scale 7 out of 10 dull aching nature would like to discuss treatment options for this.  Hurts with ambulation worse with pressure   Review of Systems: Negative except as noted in the HPI. Denies N/V/F/Ch.  Past Medical History:  Diagnosis Date   (HFpEF) heart failure with preserved ejection fraction (HCC) 06/28/2011   a.) TTE 06/28/2011: EF 55-60%, mild AR, G1DD; b.) TTE 01/16/2013: EF 55-60%, mild MR; c.) TTE 02/16/2018: EF 60-65%, G1DD; d.) MPI 11/26/2018: EF 56%   Abnormal electrocardiogram 11/12/2018   Acute kidney failure 01/12/2017   Acute on chronic respiratory failure with hypoxia (HCC) 09/06/2022   Adenoma of left adrenal gland    Anemia    Anxiety    Aortic atherosclerosis    Bilateral carpal tunnel syndrome    CAD (coronary artery disease)    a.) HR CT chest 10/30/2021: 2 vessel CAD (LAD/RCA)   CAP (community acquired pneumonia)    COPD (chronic obstructive pulmonary disease) (HCC)    COPD exacerbation (HCC) 01/21/2018   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   Diverticulosis    Dyspnea    DUE TO HEAT WITH COPD   Epistaxis 01/09/2017   GERD (gastroesophageal reflux disease)    Herpes zoster 07/08/2015   Hypertension    Hypothyroidism    Iatrogenic hypotension 01/12/2017   Iliac artery stenosis, bilateral    Morbid obesity with BMI of 40.0-44.9, adult (HCC)    New onset of headaches    Osteopenia    PMR (polymyalgia rheumatica)    Pneumonia 02/13/2018   PONV (postoperative nausea and vomiting)    Pulmonary nodule, left    a.)  CT chest 10/30/2021: 1.5 cm medial LUL nodule; b.) PET CT 11/21/2021: hypermetabolic LUL nodule (SUX max 9.3)   S/P carpal tunnel release 08/04/2020   Shingles    Stasis dermatitis    Strain of chest wall 01/20/2011   Tobacco abuse    Vitamin D  deficiency     Current Outpatient Medications:    alendronate  (FOSAMAX ) 70 MG tablet, Take 70 mg by mouth once a week. Take with a full glass of water on an empty stomach., Disp: , Rfl:    atorvastatin  (LIPITOR) 10 MG tablet, TAKE 1 TABLET BY MOUTH EVERY DAY FOR CHOLESTEROL, Disp: 90 tablet, Rfl: 3   Calcium  Carb-Cholecalciferol (CALCIUM  + VITAMIN D3 PO), Take 1,200 mg by mouth daily., Disp: , Rfl:    dapagliflozin propanediol  (FARXIGA ) 10 MG TABS tablet, TAKE 1 TABLET BY MOUTH DAILY BEFORE BREAKFAST., Disp: 90 tablet, Rfl: 3   famotidine  (PEPCID ) 20 MG tablet, TAKE 1 TABLET BY MOUTH AFTER SUPPER, Disp: 90 tablet, Rfl: 3   furosemide  (LASIX ) 40 MG tablet, Take 1 tablet (40 mg total) by mouth daily., Disp: 30 tablet, Rfl: 0   Glycopyrrolate -Formoterol  (BEVESPI  AEROSPHERE) 9-4.8 MCG/ACT AERO, Inhale 2 puffs into the lungs 2 (two) times daily., Disp: 3 each, Rfl: 3   levothyroxine  (SYNTHROID ) 75 MCG tablet, TAKE 1 TABLET BY MOUTH EVERY MORNING ON EMPTY STOMACH WITH WATER ONLY,NO FOOD OR OTHER  MED X30MINUTES, Disp: 90 tablet, Rfl: 2   losartan  (COZAAR ) 100 MG tablet, TAKE 1 TABLET (100 MG TOTAL) BY MOUTH DAILY. PLEASE CALL [phone] TO SCHEDULE A YEARLY APPOINTMENT. THANK YOU., Disp: 90 tablet, Rfl: 3   Multiple Vitamins-Minerals (PRESERVISION AREDS 2 PO), Take 1 tablet by mouth in the morning and at bedtime., Disp: , Rfl:    pantoprazole  (PROTONIX ) 40 MG tablet, Take 1 tablet (40 mg total) by mouth daily. for heartburn., Disp: 90 tablet, Rfl: 3   pentoxifylline  (TRENTAL ) 400 MG CR tablet, Take 1 tablet (400 mg total) by mouth in the morning and at bedtime., Disp: 60 tablet, Rfl: 5   spironolactone  (ALDACTONE ) 25 MG tablet, Take 0.5 tablets (12.5 mg  total) by mouth daily., Disp: 45 tablet, Rfl: 3   tacrolimus  (PROTOPIC ) 0.1 % ointment, Apply to aa's lower legs BID PRN., Disp: 60 g, Rfl: 5  Social History   Tobacco Use  Smoking Status Former   Current packs/day: 0.00   Average packs/day: 1.5 packs/day for 20.0 years (30.0 ttl pk-yrs)   Types: Cigarettes   Start date: 02/11/1989   Quit date: 02/11/2009   Years since quitting: 14.7  Smokeless Tobacco Never    Allergies  Allergen Reactions   Epinephrine  Anaphylaxis and Shortness Of Breath   Food Rash    MANGO = cause rashes around the mouth LIPS GO NUMB   [employer] Rash    THIS IS MANGO LIPS GO NUMB   Other Rash    MANGO = cause rashes around the mouth MANGO = cause rashes around the mouth LIPS GO NUMB   Prednisone  Anaphylaxis, Hives, Shortness Of Breath, Itching and Swelling    The face swells HAS TAKEN CORTISONE SHOTS WITHOUT ISSUE BEFORE   Ivp Dye [Iodinated Contrast Media] Hives   Lactose Intolerance (Gi) Diarrhea   Pneumococcal 13-Val Conj Vacc Itching and Other (See Comments)    Arm temp elevated at site of injection, red, itching   Prevnar 13 [Pneumococcal 13-Val Conj Vacc] Other (See Comments)    Arm temp elevated at site of injection, red, itching   Objective:  There were no vitals filed for this visit. There is no height or weight on file to calculate BMI. Constitutional Well developed. Well nourished.  Vascular Dorsalis pedis pulses palpable bilaterally. Posterior tibial pulses palpable bilaterally. Capillary refill normal to all digits.  No cyanosis or clubbing noted. Pedal hair growth normal.  Neurologic Normal speech. Oriented to person, place, and time. Epicritic sensation to light touch grossly present bilaterally.  Dermatologic Left submetatarsal 5 porokeratotic lesion with central nucleated core noted mild pain on palpation.  No open wounds noted no pinpoint bleeding noted  Orthopedic: Normal joint ROM without pain or crepitus  bilaterally. No visible deformities. No bony tenderness.   Radiographs: None Assessment:   1. Porokeratosis    Plan:  Patient was evaluated and treated and all questions answered.  Left submetatarsal 5 porokeratosis - All questions or concerns were discussed with the patient in extensive detail given the amount of pain that she is having she would benefit from debridement of the lesion as a courtesy the lesion was debrided down to healthy stripe tissue no complication noted no pinpoint bleeding noted.  No follow-ups on file.

## 2023-10-30 ENCOUNTER — Ambulatory Visit: Admitting: Physician Assistant

## 2023-11-06 ENCOUNTER — Emergency Department (HOSPITAL_COMMUNITY)

## 2023-11-06 ENCOUNTER — Inpatient Hospital Stay (HOSPITAL_COMMUNITY)
Admission: EM | Admit: 2023-11-06 | Discharge: 2023-11-22 | DRG: 483 | Disposition: A | Discharge status: Skilled Nursing Facility | Attending: Emergency Medicine | Admitting: Emergency Medicine

## 2023-11-06 ENCOUNTER — Other Ambulatory Visit: Payer: Self-pay

## 2023-11-06 ENCOUNTER — Encounter (HOSPITAL_COMMUNITY): Payer: Self-pay

## 2023-11-06 DIAGNOSIS — Z7983 Long term (current) use of bisphosphonates: Secondary | ICD-10-CM

## 2023-11-06 DIAGNOSIS — I5033 Acute on chronic diastolic (congestive) heart failure: Secondary | ICD-10-CM | POA: Diagnosis not present

## 2023-11-06 DIAGNOSIS — J9621 Acute and chronic respiratory failure with hypoxia: Secondary | ICD-10-CM | POA: Diagnosis present

## 2023-11-06 DIAGNOSIS — Z91041 Radiographic dye allergy status: Secondary | ICD-10-CM

## 2023-11-06 DIAGNOSIS — Z79899 Other long term (current) drug therapy: Secondary | ICD-10-CM

## 2023-11-06 DIAGNOSIS — Z801 Family history of malignant neoplasm of trachea, bronchus and lung: Secondary | ICD-10-CM

## 2023-11-06 DIAGNOSIS — E785 Hyperlipidemia, unspecified: Secondary | ICD-10-CM | POA: Diagnosis present

## 2023-11-06 DIAGNOSIS — I5022 Chronic systolic (congestive) heart failure: Secondary | ICD-10-CM | POA: Diagnosis not present

## 2023-11-06 DIAGNOSIS — E038 Other specified hypothyroidism: Secondary | ICD-10-CM | POA: Diagnosis not present

## 2023-11-06 DIAGNOSIS — Z6836 Body mass index (BMI) 36.0-36.9, adult: Secondary | ICD-10-CM

## 2023-11-06 DIAGNOSIS — S2243XA Multiple fractures of ribs, bilateral, initial encounter for closed fracture: Secondary | ICD-10-CM | POA: Diagnosis present

## 2023-11-06 DIAGNOSIS — I10 Essential (primary) hypertension: Secondary | ICD-10-CM | POA: Diagnosis not present

## 2023-11-06 DIAGNOSIS — J8410 Pulmonary fibrosis, unspecified: Secondary | ICD-10-CM | POA: Diagnosis present

## 2023-11-06 DIAGNOSIS — I4891 Unspecified atrial fibrillation: Secondary | ICD-10-CM | POA: Diagnosis not present

## 2023-11-06 DIAGNOSIS — W010XXA Fall on same level from slipping, tripping and stumbling without subsequent striking against object, initial encounter: Secondary | ICD-10-CM | POA: Diagnosis present

## 2023-11-06 DIAGNOSIS — I272 Pulmonary hypertension, unspecified: Secondary | ICD-10-CM | POA: Diagnosis present

## 2023-11-06 DIAGNOSIS — Y92009 Unspecified place in unspecified non-institutional (private) residence as the place of occurrence of the external cause: Secondary | ICD-10-CM | POA: Diagnosis not present

## 2023-11-06 DIAGNOSIS — D649 Anemia, unspecified: Secondary | ICD-10-CM

## 2023-11-06 DIAGNOSIS — N1831 Chronic kidney disease, stage 3a: Secondary | ICD-10-CM | POA: Diagnosis present

## 2023-11-06 DIAGNOSIS — Z803 Family history of malignant neoplasm of breast: Secondary | ICD-10-CM

## 2023-11-06 DIAGNOSIS — I5043 Acute on chronic combined systolic (congestive) and diastolic (congestive) heart failure: Secondary | ICD-10-CM | POA: Diagnosis present

## 2023-11-06 DIAGNOSIS — N183 Chronic kidney disease, stage 3 unspecified: Secondary | ICD-10-CM | POA: Diagnosis not present

## 2023-11-06 DIAGNOSIS — R509 Fever, unspecified: Secondary | ICD-10-CM | POA: Diagnosis not present

## 2023-11-06 DIAGNOSIS — D631 Anemia in chronic kidney disease: Secondary | ICD-10-CM | POA: Diagnosis present

## 2023-11-06 DIAGNOSIS — R54 Age-related physical debility: Secondary | ICD-10-CM | POA: Diagnosis present

## 2023-11-06 DIAGNOSIS — I480 Paroxysmal atrial fibrillation: Secondary | ICD-10-CM | POA: Diagnosis present

## 2023-11-06 DIAGNOSIS — S32591A Other specified fracture of right pubis, initial encounter for closed fracture: Secondary | ICD-10-CM | POA: Diagnosis present

## 2023-11-06 DIAGNOSIS — W1830XA Fall on same level, unspecified, initial encounter: Secondary | ICD-10-CM | POA: Diagnosis not present

## 2023-11-06 DIAGNOSIS — K219 Gastro-esophageal reflux disease without esophagitis: Secondary | ICD-10-CM | POA: Diagnosis present

## 2023-11-06 DIAGNOSIS — Z87891 Personal history of nicotine dependence: Secondary | ICD-10-CM

## 2023-11-06 DIAGNOSIS — Z79890 Hormone replacement therapy (postmenopausal): Secondary | ICD-10-CM

## 2023-11-06 DIAGNOSIS — Z8042 Family history of malignant neoplasm of prostate: Secondary | ICD-10-CM

## 2023-11-06 DIAGNOSIS — I5032 Chronic diastolic (congestive) heart failure: Secondary | ICD-10-CM

## 2023-11-06 DIAGNOSIS — I350 Nonrheumatic aortic (valve) stenosis: Secondary | ICD-10-CM | POA: Diagnosis present

## 2023-11-06 DIAGNOSIS — S2249XA Multiple fractures of ribs, unspecified side, initial encounter for closed fracture: Principal | ICD-10-CM | POA: Insufficient documentation

## 2023-11-06 DIAGNOSIS — Z01810 Encounter for preprocedural cardiovascular examination: Secondary | ICD-10-CM | POA: Diagnosis not present

## 2023-11-06 DIAGNOSIS — E039 Hypothyroidism, unspecified: Secondary | ICD-10-CM | POA: Diagnosis present

## 2023-11-06 DIAGNOSIS — J4489 Other specified chronic obstructive pulmonary disease: Secondary | ICD-10-CM | POA: Diagnosis present

## 2023-11-06 DIAGNOSIS — I130 Hypertensive heart and chronic kidney disease with heart failure and stage 1 through stage 4 chronic kidney disease, or unspecified chronic kidney disease: Secondary | ICD-10-CM | POA: Diagnosis present

## 2023-11-06 DIAGNOSIS — S42291A Other displaced fracture of upper end of right humerus, initial encounter for closed fracture: Secondary | ICD-10-CM | POA: Diagnosis not present

## 2023-11-06 DIAGNOSIS — S42411A Displaced simple supracondylar fracture without intercondylar fracture of right humerus, initial encounter for closed fracture: Secondary | ICD-10-CM | POA: Diagnosis present

## 2023-11-06 DIAGNOSIS — E669 Obesity, unspecified: Secondary | ICD-10-CM | POA: Diagnosis not present

## 2023-11-06 DIAGNOSIS — S0081XA Abrasion of other part of head, initial encounter: Secondary | ICD-10-CM | POA: Diagnosis present

## 2023-11-06 DIAGNOSIS — J9610 Chronic respiratory failure, unspecified whether with hypoxia or hypercapnia: Secondary | ICD-10-CM | POA: Diagnosis not present

## 2023-11-06 DIAGNOSIS — J439 Emphysema, unspecified: Secondary | ICD-10-CM | POA: Diagnosis present

## 2023-11-06 DIAGNOSIS — R040 Epistaxis: Secondary | ICD-10-CM | POA: Diagnosis present

## 2023-11-06 DIAGNOSIS — Z9981 Dependence on supplemental oxygen: Secondary | ICD-10-CM

## 2023-11-06 DIAGNOSIS — B961 Klebsiella pneumoniae [K. pneumoniae] as the cause of diseases classified elsewhere: Secondary | ICD-10-CM | POA: Diagnosis present

## 2023-11-06 DIAGNOSIS — N390 Urinary tract infection, site not specified: Secondary | ICD-10-CM | POA: Diagnosis present

## 2023-11-06 DIAGNOSIS — E66812 Obesity, class 2: Secondary | ICD-10-CM | POA: Diagnosis present

## 2023-11-06 DIAGNOSIS — I2510 Atherosclerotic heart disease of native coronary artery without angina pectoris: Secondary | ICD-10-CM | POA: Diagnosis not present

## 2023-11-06 DIAGNOSIS — S32599A Other specified fracture of unspecified pubis, initial encounter for closed fracture: Secondary | ICD-10-CM | POA: Diagnosis present

## 2023-11-06 DIAGNOSIS — Z90710 Acquired absence of both cervix and uterus: Secondary | ICD-10-CM

## 2023-11-06 DIAGNOSIS — Z833 Family history of diabetes mellitus: Secondary | ICD-10-CM

## 2023-11-06 DIAGNOSIS — J9611 Chronic respiratory failure with hypoxia: Secondary | ICD-10-CM | POA: Diagnosis not present

## 2023-11-06 DIAGNOSIS — Z8249 Family history of ischemic heart disease and other diseases of the circulatory system: Secondary | ICD-10-CM

## 2023-11-06 DIAGNOSIS — I471 Supraventricular tachycardia, unspecified: Secondary | ICD-10-CM | POA: Diagnosis present

## 2023-11-06 DIAGNOSIS — M7521 Bicipital tendinitis, right shoulder: Secondary | ICD-10-CM | POA: Diagnosis present

## 2023-11-06 DIAGNOSIS — N179 Acute kidney failure, unspecified: Secondary | ICD-10-CM | POA: Diagnosis present

## 2023-11-06 DIAGNOSIS — R7881 Bacteremia: Secondary | ICD-10-CM | POA: Diagnosis present

## 2023-11-06 DIAGNOSIS — Z831 Family history of other infectious and parasitic diseases: Secondary | ICD-10-CM

## 2023-11-06 DIAGNOSIS — N9489 Other specified conditions associated with female genital organs and menstrual cycle: Secondary | ICD-10-CM | POA: Diagnosis present

## 2023-11-06 DIAGNOSIS — S42201A Unspecified fracture of upper end of right humerus, initial encounter for closed fracture: Secondary | ICD-10-CM | POA: Diagnosis not present

## 2023-11-06 DIAGNOSIS — Z85118 Personal history of other malignant neoplasm of bronchus and lung: Secondary | ICD-10-CM

## 2023-11-06 DIAGNOSIS — M8580 Other specified disorders of bone density and structure, unspecified site: Secondary | ICD-10-CM | POA: Diagnosis present

## 2023-11-06 LAB — COMPREHENSIVE METABOLIC PANEL WITH GFR
ALT: 18 U/L (ref 0–44)
AST: 20 U/L (ref 15–41)
Albumin: 3.3 g/dL — ABNORMAL LOW (ref 3.5–5.0)
Alkaline Phosphatase: 63 U/L (ref 38–126)
Anion gap: 13 (ref 5–15)
BUN: 30 mg/dL — ABNORMAL HIGH (ref 8–23)
CO2: 24 mmol/L (ref 22–32)
Calcium: 9 mg/dL (ref 8.9–10.3)
Chloride: 102 mmol/L (ref 98–111)
Creatinine, Ser: 1.15 mg/dL — ABNORMAL HIGH (ref 0.44–1.00)
GFR, Estimated: 49 mL/min — ABNORMAL LOW (ref >60)
Glucose, Bld: 140 mg/dL — ABNORMAL HIGH (ref 70–99)
Potassium: 4.1 mmol/L (ref 3.5–5.1)
Sodium: 139 mmol/L (ref 135–145)
Total Bilirubin: 0.5 mg/dL (ref 0.0–1.2)
Total Protein: 7.3 g/dL (ref 6.5–8.1)

## 2023-11-06 LAB — I-STAT CHEM 8, ED
BUN: 36 mg/dL — ABNORMAL HIGH (ref 8–23)
BUN: 30 mg/dL — ABNORMAL HIGH (ref 8–23)
Calcium, Ion: 1.15 mmol/L (ref 1.15–1.40)
Calcium, Ion: 1.15 mmol/L (ref 1.15–1.40)
Chloride: 104 mmol/L (ref 98–111)
Chloride: 106 mmol/L (ref 98–111)
Creatinine, Ser: 1.2 mg/dL — ABNORMAL HIGH (ref 0.44–1.00)
Creatinine, Ser: 1.1 mg/dL — ABNORMAL HIGH (ref 0.44–1.00)
Glucose, Bld: 138 mg/dL — ABNORMAL HIGH (ref 70–99)
Glucose, Bld: 139 mg/dL — ABNORMAL HIGH (ref 70–99)
HCT: 35 % — ABNORMAL LOW (ref 36.0–46.0)
HCT: 35 % — ABNORMAL LOW (ref 36.0–46.0)
Hemoglobin: 11.9 g/dL — ABNORMAL LOW (ref 12.0–15.0)
Hemoglobin: 11.9 g/dL — ABNORMAL LOW (ref 12.0–15.0)
Potassium: 4.3 mmol/L (ref 3.5–5.1)
Potassium: 4.6 mmol/L (ref 3.5–5.1)
Sodium: 140 mmol/L (ref 135–145)
Sodium: 140 mmol/L (ref 135–145)
TCO2: 27 mmol/L (ref 22–32)
TCO2: 23 mmol/L (ref 22–32)

## 2023-11-06 LAB — CBC WITH DIFFERENTIAL/PLATELET
Abs Immature Granulocytes: 0.16 K/uL — ABNORMAL HIGH (ref 0.00–0.07)
Basophils Absolute: 0.1 K/uL (ref 0.0–0.1)
Basophils Relative: 0 %
Eosinophils Absolute: 0 K/uL (ref 0.0–0.5)
Eosinophils Relative: 0 %
HCT: 36.7 % (ref 36.0–46.0)
Hemoglobin: 11.6 g/dL — ABNORMAL LOW (ref 12.0–15.0)
Immature Granulocytes: 1 %
Lymphocytes Relative: 6 %
Lymphs Abs: 1 K/uL (ref 0.7–4.0)
MCH: 30.2 pg (ref 26.0–34.0)
MCHC: 31.6 g/dL (ref 30.0–36.0)
MCV: 95.6 fL (ref 80.0–100.0)
Monocytes Absolute: 1.3 K/uL — ABNORMAL HIGH (ref 0.1–1.0)
Monocytes Relative: 7 %
Neutro Abs: 15.5 K/uL — ABNORMAL HIGH (ref 1.7–7.7)
Neutrophils Relative %: 86 %
Platelets: 331 K/uL (ref 150–400)
RBC: 3.84 MIL/uL — ABNORMAL LOW (ref 3.87–5.11)
RDW: 13.4 % (ref 11.5–15.5)
WBC: 18.1 K/uL — ABNORMAL HIGH (ref 4.0–10.5)
nRBC: 0 % (ref 0.0–0.2)

## 2023-11-06 LAB — CBC
HCT: 35 % — ABNORMAL LOW (ref 36.0–46.0)
Hemoglobin: 11.1 g/dL — ABNORMAL LOW (ref 12.0–15.0)
MCH: 30.1 pg (ref 26.0–34.0)
MCHC: 31.7 g/dL (ref 30.0–36.0)
MCV: 94.9 fL (ref 80.0–100.0)
Platelets: 321 K/uL (ref 150–400)
RBC: 3.69 MIL/uL — ABNORMAL LOW (ref 3.87–5.11)
RDW: 13.4 % (ref 11.5–15.5)
WBC: 17.3 K/uL — ABNORMAL HIGH (ref 4.0–10.5)
nRBC: 0 % (ref 0.0–0.2)

## 2023-11-06 MED ORDER — ONDANSETRON HCL 4 MG/2ML IJ SOLN
4.0000 mg | Freq: Once | INTRAMUSCULAR | Status: AC
  Administered 2023-11-06: 4 mg via INTRAVENOUS
  Filled 2023-11-06: qty 2

## 2023-11-06 MED ORDER — FENTANYL CITRATE (PF) 50 MCG/ML IJ SOSY
50.0000 ug | PREFILLED_SYRINGE | Freq: Once | INTRAMUSCULAR | Status: AC
  Administered 2023-11-06: 50 ug via INTRAVENOUS
  Filled 2023-11-06: qty 1

## 2023-11-06 NOTE — Consult Note (Signed)
 Activation and Reason: non activated; ground level fall  HPI: Teresa Jensen is an 78 y.o. 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   She underwent workup in the ER and was found to have right humerus fx as well as pubic rami fx. She is being admitted for further care and we are asked to see in consultation.   She denies LOC.  Currently she reports right arm pain near her shoulder. She also notes pain in her right pelvic region. She denies pain in her head, neck, chest, abdomen, back, left upper extremity or lower extremities. She has not ambulated since the fall.  Past Medical History:  Diagnosis Date   (HFpEF) heart failure with preserved ejection fraction (HCC) 06/28/2011   a.) TTE 06/28/2011: EF 55-60%, mild AR, G1DD; b.) TTE 01/16/2013: EF 55-60%, mild MR; c.) TTE 02/16/2018: EF 60-65%, G1DD; d.) MPI 11/26/2018: EF 56%   Abnormal electrocardiogram 11/12/2018   Acute kidney failure 01/12/2017   Acute on chronic respiratory failure with hypoxia (HCC) 09/06/2022   Adenoma of left adrenal gland    Anemia    Anxiety    Aortic atherosclerosis    Bilateral carpal tunnel syndrome    CAD (coronary artery disease)    a.) HR CT chest 10/30/2021: 2 vessel CAD (LAD/RCA)   CAP (community acquired pneumonia)    COPD (chronic obstructive pulmonary disease) (HCC)    COPD exacerbation (HCC) 01/21/2018   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   Diverticulosis    Dyspnea    DUE TO HEAT WITH COPD   Epistaxis 01/09/2017   GERD (gastroesophageal reflux disease)    Herpes zoster 07/08/2015   Hypertension    Hypothyroidism    Iatrogenic  hypotension 01/12/2017   Iliac artery stenosis, bilateral    Morbid obesity with BMI of 40.0-44.9, adult (HCC)    New onset of headaches    Osteopenia    PMR (polymyalgia rheumatica)    Pneumonia 02/13/2018   PONV (postoperative nausea and vomiting)    Pulmonary nodule, left    a.) CT chest 10/30/2021: 1.5 cm medial LUL nodule; b.) PET CT 11/21/2021: hypermetabolic LUL nodule (SUX max 9.3)   S/P carpal tunnel release 08/04/2020   Shingles    Stasis dermatitis    Strain of chest wall 01/20/2011   Tobacco abuse    Vitamin D  deficiency     Past Surgical History:  Procedure Laterality Date   ANTERIOR CERVICAL DECOMP/DISCECTOMY FUSION  06/22/2004   C4-C6   APPENDECTOMY     CARPAL TUNNEL RELEASE Left 08/31/2019   Procedure: CARPAL TUNNEL RELEASE;  Surgeon: Mardee Lynwood SQUIBB, MD;  Location: [facility] ORS;  Service: Orthopedics;  Laterality: Left;   CARPAL TUNNEL RELEASE Right 06/22/2020   Procedure: CARPAL TUNNEL RELEASE;  Surgeon: Mardee Lynwood SQUIBB, MD;  Location: [facility] ORS;  Service: Orthopedics;  Laterality: Right;   CHOLECYSTECTOMY     COLONOSCOPY     ENDOBRONCHIAL ULTRASOUND Left 01/17/2022   Procedure: ENDOBRONCHIAL ULTRASOUND;  Surgeon: Tamea Dedra CROME, MD;  Location: [facility] ORS;  Service: Pulmonary;  Laterality: Left;   ESOPHAGOGASTRODUODENOSCOPY     HEMORRHOID SURGERY     TOTAL ABDOMINAL HYSTERECTOMY W/ BILATERAL SALPINGOOPHORECTOMY     TUBAL LIGATION  Family History  Problem Relation Age of Onset   Heart attack Mother 23   Cancer Father        lung/chest wall    Prostate cancer Father    Diabetes Brother        siblings   Hypertension Sister        x 2   Heart disease Brother    Breast cancer Paternal Grandmother    Heart disease Sister    Diabetes Sister    Tuberculosis Sister    Cirrhosis Brother     Social:  reports that she quit smoking about 14 years ago. Her smoking use included cigarettes. She started smoking about 34 years ago. She has a 30 pack-year  smoking history. She has never used smokeless tobacco. She reports that she does not drink alcohol and does not use drugs.  Allergies:  Allergies  Allergen Reactions   Epinephrine  Anaphylaxis and Shortness Of Breath   Food Rash    MANGO = cause rashes around the mouth LIPS GO NUMB   [employer] Rash    THIS IS MANGO LIPS GO NUMB   Other Rash    MANGO = cause rashes around the mouth MANGO = cause rashes around the mouth LIPS GO NUMB   Prednisone  Anaphylaxis, Hives, Shortness Of Breath, Itching and Swelling    The face swells HAS TAKEN CORTISONE SHOTS WITHOUT ISSUE BEFORE   Ivp Dye [Iodinated Contrast Media] Hives   Lactose Intolerance (Gi) Diarrhea   Pneumococcal 13-Val Conj Vacc Itching and Other (See Comments)    Arm temp elevated at site of injection, red, itching   Prevnar 13 [Pneumococcal 13-Val Conj Vacc] Other (See Comments)    Arm temp elevated at site of injection, red, itching    Medications: I have reviewed the patient's current medications.  Results for orders placed or performed during the hospital encounter of 11/06/23 (from the past 48 hours)  Comprehensive metabolic panel     Status: Abnormal   Collection Time: 11/06/23  6:00 PM  Result Value Ref Range   Sodium 139 135 - 145 mmol/L   Potassium 4.1 3.5 - 5.1 mmol/L   Chloride 102 98 - 111 mmol/L   CO2 24 22 - 32 mmol/L   Glucose, Bld 140 (H) 70 - 99 mg/dL    Comment: Glucose reference range applies only to samples taken after fasting for at least 8 hours.   BUN 30 (H) 8 - 23 mg/dL   Creatinine, Ser 8.84 (H) 0.44 - 1.00 mg/dL   Calcium  9.0 8.9 - 10.3 mg/dL   Total Protein 7.3 6.5 - 8.1 g/dL   Albumin 3.3 (L) 3.5 - 5.0 g/dL   AST 20 15 - 41 U/L   ALT 18 0 - 44 U/L   Alkaline Phosphatase 63 38 - 126 U/L   Total Bilirubin 0.5 0.0 - 1.2 mg/dL   GFR, Estimated 49 (L) >60 mL/min    Comment: (NOTE) Calculated using the CKD-EPI Creatinine Equation (2021)    Anion gap 13 5 - 15    Comment: Performed at  [facility], [address], [place], [name] [address]  CBC WITH DIFFERENTIAL     Status: Abnormal   Collection Time: 11/06/23  6:00 PM  Result Value Ref Range   WBC 18.1 (H) 4.0 - 10.5 K/uL   RBC 3.84 (L) 3.87 - 5.11 MIL/uL   Hemoglobin 11.6 (L) 12.0 - 15.0 g/dL   HCT 63.2 63.9 - 53.9 %   MCV  95.6 80.0 - 100.0 fL   MCH 30.2 26.0 - 34.0 pg   MCHC 31.6 30.0 - 36.0 g/dL   RDW 86.5 88.4 - 84.4 %   Platelets 331 150 - 400 K/uL   nRBC 0.0 0.0 - 0.2 %   Neutrophils Relative % 86 %   Neutro Abs 15.5 (H) 1.7 - 7.7 K/uL   Lymphocytes Relative 6 %   Lymphs Abs 1.0 0.7 - 4.0 K/uL   Monocytes Relative 7 %   Monocytes Absolute 1.3 (H) 0.1 - 1.0 K/uL   Eosinophils Relative 0 %   Eosinophils Absolute 0.0 0.0 - 0.5 K/uL   Basophils Relative 0 %   Basophils Absolute 0.1 0.0 - 0.1 K/uL   Immature Granulocytes 1 %   Abs Immature Granulocytes 0.16 (H) 0.00 - 0.07 K/uL    Comment: Performed at [facility], [address], [place], [name] [address]  I-Stat Chem 8, ED     Status: Abnormal   Collection Time: 11/06/23  6:11 PM  Result Value Ref Range   Sodium 140 135 - 145 mmol/L   Potassium 4.3 3.5 - 5.1 mmol/L   Chloride 104 98 - 111 mmol/L   BUN 36 (H) 8 - 23 mg/dL   Creatinine, Ser 8.79 (H) 0.44 - 1.00 mg/dL   Glucose, Bld 861 (H) 70 - 99 mg/dL    Comment: Glucose reference range applies only to samples taken after fasting for at least 8 hours.   Calcium , Ion 1.15 1.15 - 1.40 mmol/L   TCO2 27 22 - 32 mmol/L   Hemoglobin 11.9 (L) 12.0 - 15.0 g/dL   HCT 64.9 (L) 63.9 - 53.9 %  CBC     Status: Abnormal   Collection Time: 11/06/23  9:10 PM  Result Value Ref Range   WBC 17.3 (H) 4.0 - 10.5 K/uL   RBC 3.69 (L) 3.87 - 5.11 MIL/uL   Hemoglobin 11.1 (L) 12.0 - 15.0 g/dL   HCT 64.9 (L) 63.9 - 53.9 %   MCV 94.9 80.0 - 100.0 fL   MCH 30.1 26.0 - 34.0 pg   MCHC 31.7 30.0 - 36.0 g/dL   RDW 86.5 88.4 - 84.4 %   Platelets 321 150 - 400 K/uL   nRBC 0.0 0.0 - 0.2 %    Comment:  Performed at [facility], [address], [place], [name] [address]  I-stat chem 8, ED (not at [facility], [facility] or [facility])     Status: Abnormal   Collection Time: 11/06/23  9:22 PM  Result Value Ref Range   Sodium 140 135 - 145 mmol/L   Potassium 4.6 3.5 - 5.1 mmol/L   Chloride 106 98 - 111 mmol/L   BUN 30 (H) 8 - 23 mg/dL   Creatinine, Ser 8.89 (H) 0.44 - 1.00 mg/dL   Glucose, Bld 860 (H) 70 - 99 mg/dL    Comment: Glucose reference range applies only to samples taken after fasting for at least 8 hours.   Calcium , Ion 1.15 1.15 - 1.40 mmol/L   TCO2 23 22 - 32 mmol/L   Hemoglobin 11.9 (L) 12.0 - 15.0 g/dL   HCT 64.9 (L) 63.9 - 53.9 %    CT HEAD WO CONTRAST Result Date: 11/06/2023 EXAM: CT HEAD WITHOUT CONTRAST 11/06/2023 07:27:00 PM TECHNIQUE: CT of the head was performed without the administration of intravenous contrast. Automated exposure control, iterative reconstruction, and/or weight based adjustment of the mA/kV was utilized to reduce the radiation dose to as  low as reasonably achievable. COMPARISON: None available. CLINICAL HISTORY: Head trauma, moderate-severe FINDINGS: BRAIN AND VENTRICLES: No acute hemorrhage. No evidence of acute infarct. No hydrocephalus. No extra-axial collection. No mass effect or midline shift. ORBITS: No acute abnormality. SINUSES: No acute abnormality. SOFT TISSUES AND SKULL: No acute soft tissue abnormality. No skull fracture. IMPRESSION: 1. No acute intracranial abnormality or skull fracture. Electronically signed by: Maude Stammer MD 11/06/2023 07:47 PM EST RP Workstation: [id number]   CT CERVICAL SPINE WO CONTRAST Result Date: 11/06/2023 EXAM: CT CERVICAL SPINE WITHOUT CONTRAST 11/06/2023 07:27:00 PM TECHNIQUE: CT of the cervical spine was performed without the administration of intravenous contrast. Multiplanar reformatted images are provided for review. Automated exposure control, iterative reconstruction, and/or weight based adjustment of the mA/kV was  utilized to reduce the radiation dose to as low as reasonably achievable. COMPARISON: None available. CLINICAL HISTORY: Polytrauma, blunt FINDINGS: CERVICAL SPINE: BONES AND ALIGNMENT: Normal alignment of the cervical vertebral bodies. No acute cervical spine fracture is identified. Surgical changes related to prior C4-C5 and C5-C6 anterior interbody fusion. No complicating features are identified. The facets are normally aligned. No fractures. DEGENERATIVE CHANGES: The spinal canal is fairly generous. No significant canal stenosis. Small focal calcified disc protrusion noted at C3-C4 with mass effect on the central aspect of the thecal sac. There is also moderate left foraminal stenosis at this level. SOFT TISSUES: No abnormal prevertebral soft tissue swelling. No neck mass, adenopathy, or hematoma. The lung apices are grossly clear. Advanced emphysematous changes and pulmonary scarring noted. IMPRESSION: 1. No acute cervical spine fracture. 2. Small calcified disc protrusion at C3-4 with thecal sac mass effect and moderate left foraminal stenosis. 3. Postsurgical C4-5 and C5-6 anterior interbody fusion without complicating features. Electronically signed by: Maude Stammer MD 11/06/2023 07:46 PM EST RP Workstation: [id number]   CT CHEST ABDOMEN PELVIS WO CONTRAST Result Date: 11/06/2023 EXAM: CT CHEST, ABDOMEN AND PELVIS WITHOUT CONTRAST 11/06/2023 07:27:00 PM TECHNIQUE: CT of the chest, abdomen and pelvis was performed without the administration of intravenous contrast. Multiplanar reformatted images are provided for review. Automated exposure control, iterative reconstruction, and/or weight based adjustment of the mA/kV was utilized to reduce the radiation dose to as low as reasonably achievable. COMPARISON: Prior chest CT 07/29/2023 and prior abdominal/pelvic CT scan 12/20/2022. CLINICAL HISTORY: FINDINGS: CHEST: MEDIASTINUM AND LYMPH NODES: The heart is normal in size. No pericardial effusion. Stable  tortuosity and calcification of the thoracic aorta and branch vessels. Stable 3-vessel coronary artery calcifications. Stable scattered mediastinal and hilar lymph nodes but no new or progressive findings. The central airways are clear. The esophagus is grossly normal. LUNGS AND PLEURA: Progressive medial left upper lobe soft tissue density most likely reflecting progressive radiation fibrosis could not totally exclude the possibility of recurrent tumor. Stable advanced emphysematous changes and pulmonary scarring. Stable 7 mm nodular lesion in the left lower lobe on image 57/06. Progressive nodularity along the nearby major fissure is indeterminate. No new pulmonary nodules. No acute pulmonary process. No pulmonary contusion, pneumothorax, or pleural effusion. ABDOMEN AND PELVIS: LIVER: No acute hepatic injury or perihepatic fluid collection. GALLBLADDER AND BILE DUCTS: The gallbladder is surgically absent. Common bile duct dilatation. SPLEEN: No acute splenic injury or perisplenic fluid collection. PANCREAS: No acute pancreatic injury or peripancreatic fluid collection. ADRENAL GLANDS: Stable bilateral adrenal gland nodules consistent with benign adenomas. KIDNEYS, URETERS AND BLADDER: No renal lesions or hydronephrosis. No perinephric hematoma. No stones in the kidneys or ureters. No perinephric or periureteral stranding. The bladder is unremarkable. GI  AND BOWEL: The stomach, duodenum, small bowel, and colon are grossly normal without oral contrast. No inflammatory changes, mass lesions, or obstructive findings. The terminal ileum is normal. Colonic diverticulosis again noted. REPRODUCTIVE ORGANS: The uterus and ovaries are surgically absent. PERITONEUM AND RETROPERITONEUM: No ascites. No free air. No mesenteric or retroperitoneal mass, adenopathy, or hematoma. VASCULATURE: Advanced atherosclerotic calcification involving the aorta and branch vessels but no aneurysm. ABDOMINAL AND PELVIS LYMPH NODES: No  lymphadenopathy. BONES AND SOFT TISSUES: The bony thorax is intact. No acute sternal, rib, or thoracic vertebral body fractures. No chest wall contusion or breast mass. Superior and inferior pubic rami fractures on the right side. Both hips are normally located. No hip fractures. The pubic symphysis and SI joints are intact. The lumbar vertebral bodies are intact. Right-sided extraperitoneal pelvic hematoma surrounding the bladder, findings secondary to right-sided pubic rami fractures. IMPRESSION: 1. Progressive medial left upper lobe soft tissue density most consistent with progressive radiation fibrosis; recurrent tumor cannot be excluded. Recommend short-interval chest CT follow-up or PET/CT and oncologic evaluation as indicated. 2. Stable 7 mm left lower lobe pulmonary nodule. 3. Progressive indeterminate nodularity along the adjacent major fissure. Recommend short-interval chest CT follow-up to assess for stability. 4. Right-sided extraperitoneal pelvic hematoma surrounding the bladder, secondary to right-sided pubic rami fractures. Electronically signed by: Maude Stammer MD 11/06/2023 07:42 PM EST RP Workstation: [id number]   DG Pelvis Portable Result Date: 11/06/2023 CLINICAL DATA:  Clemens, pain EXAM: PORTABLE PELVIS 1-2 VIEWS COMPARISON:  12/20/2022 FINDINGS: Supine frontal view of the pelvis was obtained, the patient is slightly rotated toward the left. No acute displaced fracture, subluxation, or dislocation. Joint spaces are relatively well preserved. Prominent atherosclerosis of the aorta and its branches. IMPRESSION: 1. No acute displaced fracture. Electronically Signed   By: Ozell Daring M.D.   On: 11/06/2023 17:22   DG Chest Port 1 View Result Date: 11/06/2023 CLINICAL DATA:  Clemens, right shoulder pain EXAM: PORTABLE CHEST 1 VIEW COMPARISON:  09/06/2022 FINDINGS: Frontal view of the chest was obtained, excluding the right costophrenic angle by collimation. The cardiac silhouette is  unremarkable. Increased density in the left suprahilar region compatible with patient's known history of lung cancer, may reflect post therapeutic change given findings on recent CT. Chronic consolidation within the periphery of the superior segment of the left lower lobe again noted and stable. No new airspace disease, effusion, or pneumothorax. There is a displaced impacted fracture through the right humeral neck, incompletely characterized on this exam due to collimation. There are acute displaced right lateral fifth through eighth rib fractures as well. Prior healed left rib fractures are incidentally noted. IMPRESSION: 1. Acute fractures involving the right humeral neck and right lateral fifth through eighth ribs. 2. Chronic left suprahilar density unchanged since recent CT, compatible with known history of lung cancer. This could reflect post therapeutic changes in this region and continued follow-up is recommended. 3. Chronic scarring within the left lower lobe. Electronically Signed   By: Ozell Daring M.D.   On: 11/06/2023 17:21    ROS - all of the below systems have been reviewed with the patient and positives are indicated with bold text General: chills, fever or night sweats Eyes: blurry vision or double vision ENT: epistaxis or sore throat Allergy/Immunology: itchy/watery eyes or nasal congestion Hematologic/Lymphatic: bleeding problems, blood clots or swollen lymph nodes Endocrine: temperature intolerance or unexpected weight changes Breast: new or changing breast lumps or nipple discharge Resp: cough, shortness of breath (chronic), or wheezing CV: chest  pain or dyspnea on exertion(chronic) GI: nausea, vomiting, abdominal pain GU: dysuria, trouble voiding, or hematuria MSK: joint pain (chronic) or joint stiffness Neuro: TIA or stroke symptoms Derm: pruritus and skin lesion changes Psych: anxiety and depression  PE Blood pressure (!) 122/43, pulse 99, temperature 98.4 F (36.9 C),  temperature source Oral, resp. rate (!) 22, height 4' 9 (1.448 m), weight 72.6 kg, SpO2 94%. Physical Exam Constitutional: NAD; conversant; R arm in sling Eyes: Moist conjunctiva; no lid lag; anicteric; PERRL Neck: Trachea midline; no thyromegaly Lungs: Normal respiratory effort; CTAB CV: RRR; no palpable thrills; no pitting edema GI: Abd soft, NT/ND; no palpable hepatosplenomegaly MSK: Normal range of motion of left upper + bilateral extremities; no clubbing/cyanosis Psychiatric: Appropriate affect; alert and oriented x3 Lymphatic: No palpable cervical or axillary lymphadenopathy  Results for orders placed or performed during the hospital encounter of 11/06/23 (from the past 48 hours)  Comprehensive metabolic panel     Status: Abnormal   Collection Time: 11/06/23  6:00 PM  Result Value Ref Range   Sodium 139 135 - 145 mmol/L   Potassium 4.1 3.5 - 5.1 mmol/L   Chloride 102 98 - 111 mmol/L   CO2 24 22 - 32 mmol/L   Glucose, Bld 140 (H) 70 - 99 mg/dL    Comment: Glucose reference range applies only to samples taken after fasting for at least 8 hours.   BUN 30 (H) 8 - 23 mg/dL   Creatinine, Ser 8.84 (H) 0.44 - 1.00 mg/dL   Calcium  9.0 8.9 - 10.3 mg/dL   Total Protein 7.3 6.5 - 8.1 g/dL   Albumin 3.3 (L) 3.5 - 5.0 g/dL   AST 20 15 - 41 U/L   ALT 18 0 - 44 U/L   Alkaline Phosphatase 63 38 - 126 U/L   Total Bilirubin 0.5 0.0 - 1.2 mg/dL   GFR, Estimated 49 (L) >60 mL/min    Comment: (NOTE) Calculated using the CKD-EPI Creatinine Equation (2021)    Anion gap 13 5 - 15    Comment: Performed at [facility], [address] [place], [name] [address]  CBC WITH DIFFERENTIAL     Status: Abnormal   Collection Time: 11/06/23  6:00 PM  Result Value Ref Range   WBC 18.1 (H) 4.0 - 10.5 K/uL   RBC 3.84 (L) 3.87 - 5.11 MIL/uL   Hemoglobin 11.6 (L) 12.0 - 15.0 g/dL   HCT 63.2 63.9 - 53.9 %   MCV 95.6 80.0 - 100.0 fL   MCH 30.2 26.0 - 34.0 pg   MCHC 31.6 30.0 - 36.0 g/dL   RDW 86.5  88.4 - 84.4 %   Platelets 331 150 - 400 K/uL   nRBC 0.0 0.0 - 0.2 %   Neutrophils Relative % 86 %   Neutro Abs 15.5 (H) 1.7 - 7.7 K/uL   Lymphocytes Relative 6 %   Lymphs Abs 1.0 0.7 - 4.0 K/uL   Monocytes Relative 7 %   Monocytes Absolute 1.3 (H) 0.1 - 1.0 K/uL   Eosinophils Relative 0 %   Eosinophils Absolute 0.0 0.0 - 0.5 K/uL   Basophils Relative 0 %   Basophils Absolute 0.1 0.0 - 0.1 K/uL   Immature Granulocytes 1 %   Abs Immature Granulocytes 0.16 (H) 0.00 - 0.07 K/uL    Comment: Performed at [facility], [address] [place], [name] [address]  I-Stat Chem 8, ED     Status: Abnormal   Collection Time: 11/06/23  6:11 PM  Result Value Ref Range   Sodium 140 135 - 145 mmol/L   Potassium 4.3 3.5 - 5.1 mmol/L   Chloride 104 98 - 111 mmol/L   BUN 36 (H) 8 - 23 mg/dL   Creatinine, Ser 8.79 (H) 0.44 - 1.00 mg/dL   Glucose, Bld 861 (H) 70 - 99 mg/dL    Comment: Glucose reference range applies only to samples taken after fasting for at least 8 hours.   Calcium , Ion 1.15 1.15 - 1.40 mmol/L   TCO2 27 22 - 32 mmol/L   Hemoglobin 11.9 (L) 12.0 - 15.0 g/dL   HCT 64.9 (L) 63.9 - 53.9 %  CBC     Status: Abnormal   Collection Time: 11/06/23  9:10 PM  Result Value Ref Range   WBC 17.3 (H) 4.0 - 10.5 K/uL   RBC 3.69 (L) 3.87 - 5.11 MIL/uL   Hemoglobin 11.1 (L) 12.0 - 15.0 g/dL   HCT 64.9 (L) 63.9 - 53.9 %   MCV 94.9 80.0 - 100.0 fL   MCH 30.1 26.0 - 34.0 pg   MCHC 31.7 30.0 - 36.0 g/dL   RDW 86.5 88.4 - 84.4 %   Platelets 321 150 - 400 K/uL   nRBC 0.0 0.0 - 0.2 %    Comment: Performed at [facility], [address], [place], [name] [address]  I-stat chem 8, ED (not at [facility], [facility] or [facility])     Status: Abnormal   Collection Time: 11/06/23  9:22 PM  Result Value Ref Range   Sodium 140 135 - 145 mmol/L   Potassium 4.6 3.5 - 5.1 mmol/L   Chloride 106 98 - 111 mmol/L   BUN 30 (H) 8 - 23 mg/dL   Creatinine, Ser 8.89 (H) 0.44 - 1.00 mg/dL   Glucose, Bld 860 (H) 70 - 99  mg/dL    Comment: Glucose reference range applies only to samples taken after fasting for at least 8 hours.   Calcium , Ion 1.15 1.15 - 1.40 mmol/L   TCO2 23 22 - 32 mmol/L   Hemoglobin 11.9 (L) 12.0 - 15.0 g/dL   HCT 64.9 (L) 63.9 - 53.9 %    CT HEAD WO CONTRAST Result Date: 11/06/2023 EXAM: CT HEAD WITHOUT CONTRAST 11/06/2023 07:27:00 PM TECHNIQUE: CT of the head was performed without the administration of intravenous contrast. Automated exposure control, iterative reconstruction, and/or weight based adjustment of the mA/kV was utilized to reduce the radiation dose to as low as reasonably achievable. COMPARISON: None available. CLINICAL HISTORY: Head trauma, moderate-severe FINDINGS: BRAIN AND VENTRICLES: No acute hemorrhage. No evidence of acute infarct. No hydrocephalus. No extra-axial collection. No mass effect or midline shift. ORBITS: No acute abnormality. SINUSES: No acute abnormality. SOFT TISSUES AND SKULL: No acute soft tissue abnormality. No skull fracture. IMPRESSION: 1. No acute intracranial abnormality or skull fracture. Electronically signed by: Maude Stammer MD 11/06/2023 07:47 PM EST RP Workstation: [id number]   CT CERVICAL SPINE WO CONTRAST Result Date: 11/06/2023 EXAM: CT CERVICAL SPINE WITHOUT CONTRAST 11/06/2023 07:27:00 PM TECHNIQUE: CT of the cervical spine was performed without the administration of intravenous contrast. Multiplanar reformatted images are provided for review. Automated exposure control, iterative reconstruction, and/or weight based adjustment of the mA/kV was utilized to reduce the radiation dose to as low as reasonably achievable. COMPARISON: None available. CLINICAL HISTORY: Polytrauma, blunt FINDINGS: CERVICAL SPINE: BONES AND ALIGNMENT: Normal alignment of the cervical vertebral bodies. No acute cervical spine fracture is identified. Surgical changes related to prior  C4-C5 and C5-C6 anterior interbody fusion. No complicating features are identified. The  facets are normally aligned. No fractures. DEGENERATIVE CHANGES: The spinal canal is fairly generous. No significant canal stenosis. Small focal calcified disc protrusion noted at C3-C4 with mass effect on the central aspect of the thecal sac. There is also moderate left foraminal stenosis at this level. SOFT TISSUES: No abnormal prevertebral soft tissue swelling. No neck mass, adenopathy, or hematoma. The lung apices are grossly clear. Advanced emphysematous changes and pulmonary scarring noted. IMPRESSION: 1. No acute cervical spine fracture. 2. Small calcified disc protrusion at C3-4 with thecal sac mass effect and moderate left foraminal stenosis. 3. Postsurgical C4-5 and C5-6 anterior interbody fusion without complicating features. Electronically signed by: Maude Stammer MD 11/06/2023 07:46 PM EST RP Workstation: [id number]   CT CHEST ABDOMEN PELVIS WO CONTRAST Result Date: 11/06/2023 EXAM: CT CHEST, ABDOMEN AND PELVIS WITHOUT CONTRAST 11/06/2023 07:27:00 PM TECHNIQUE: CT of the chest, abdomen and pelvis was performed without the administration of intravenous contrast. Multiplanar reformatted images are provided for review. Automated exposure control, iterative reconstruction, and/or weight based adjustment of the mA/kV was utilized to reduce the radiation dose to as low as reasonably achievable. COMPARISON: Prior chest CT 07/29/2023 and prior abdominal/pelvic CT scan 12/20/2022. CLINICAL HISTORY: FINDINGS: CHEST: MEDIASTINUM AND LYMPH NODES: The heart is normal in size. No pericardial effusion. Stable tortuosity and calcification of the thoracic aorta and branch vessels. Stable 3-vessel coronary artery calcifications. Stable scattered mediastinal and hilar lymph nodes but no new or progressive findings. The central airways are clear. The esophagus is grossly normal. LUNGS AND PLEURA: Progressive medial left upper lobe soft tissue density most likely reflecting progressive radiation fibrosis could not  totally exclude the possibility of recurrent tumor. Stable advanced emphysematous changes and pulmonary scarring. Stable 7 mm nodular lesion in the left lower lobe on image 57/06. Progressive nodularity along the nearby major fissure is indeterminate. No new pulmonary nodules. No acute pulmonary process. No pulmonary contusion, pneumothorax, or pleural effusion. ABDOMEN AND PELVIS: LIVER: No acute hepatic injury or perihepatic fluid collection. GALLBLADDER AND BILE DUCTS: The gallbladder is surgically absent. Common bile duct dilatation. SPLEEN: No acute splenic injury or perisplenic fluid collection. PANCREAS: No acute pancreatic injury or peripancreatic fluid collection. ADRENAL GLANDS: Stable bilateral adrenal gland nodules consistent with benign adenomas. KIDNEYS, URETERS AND BLADDER: No renal lesions or hydronephrosis. No perinephric hematoma. No stones in the kidneys or ureters. No perinephric or periureteral stranding. The bladder is unremarkable. GI AND BOWEL: The stomach, duodenum, small bowel, and colon are grossly normal without oral contrast. No inflammatory changes, mass lesions, or obstructive findings. The terminal ileum is normal. Colonic diverticulosis again noted. REPRODUCTIVE ORGANS: The uterus and ovaries are surgically absent. PERITONEUM AND RETROPERITONEUM: No ascites. No free air. No mesenteric or retroperitoneal mass, adenopathy, or hematoma. VASCULATURE: Advanced atherosclerotic calcification involving the aorta and branch vessels but no aneurysm. ABDOMINAL AND PELVIS LYMPH NODES: No lymphadenopathy. BONES AND SOFT TISSUES: The bony thorax is intact. No acute sternal, rib, or thoracic vertebral body fractures. No chest wall contusion or breast mass. Superior and inferior pubic rami fractures on the right side. Both hips are normally located. No hip fractures. The pubic symphysis and SI joints are intact. The lumbar vertebral bodies are intact. Right-sided extraperitoneal pelvic hematoma  surrounding the bladder, findings secondary to right-sided pubic rami fractures. IMPRESSION: 1. Progressive medial left upper lobe soft tissue density most consistent with progressive radiation fibrosis; recurrent tumor cannot be excluded. Recommend short-interval chest CT  follow-up or PET/CT and oncologic evaluation as indicated. 2. Stable 7 mm left lower lobe pulmonary nodule. 3. Progressive indeterminate nodularity along the adjacent major fissure. Recommend short-interval chest CT follow-up to assess for stability. 4. Right-sided extraperitoneal pelvic hematoma surrounding the bladder, secondary to right-sided pubic rami fractures. Electronically signed by: Maude Stammer MD 11/06/2023 07:42 PM EST RP Workstation: [id number]   DG Pelvis Portable Result Date: 11/06/2023 CLINICAL DATA:  Clemens, pain EXAM: PORTABLE PELVIS 1-2 VIEWS COMPARISON:  12/20/2022 FINDINGS: Supine frontal view of the pelvis was obtained, the patient is slightly rotated toward the left. No acute displaced fracture, subluxation, or dislocation. Joint spaces are relatively well preserved. Prominent atherosclerosis of the aorta and its branches. IMPRESSION: 1. No acute displaced fracture. Electronically Signed   By: Ozell Daring M.D.   On: 11/06/2023 17:22   DG Chest Port 1 View Result Date: 11/06/2023 CLINICAL DATA:  Clemens, right shoulder pain EXAM: PORTABLE CHEST 1 VIEW COMPARISON:  09/06/2022 FINDINGS: Frontal view of the chest was obtained, excluding the right costophrenic angle by collimation. The cardiac silhouette is unremarkable. Increased density in the left suprahilar region compatible with patient's known history of lung cancer, may reflect post therapeutic change given findings on recent CT. Chronic consolidation within the periphery of the superior segment of the left lower lobe again noted and stable. No new airspace disease, effusion, or pneumothorax. There is a displaced impacted fracture through the right humeral neck,  incompletely characterized on this exam due to collimation. There are acute displaced right lateral fifth through eighth rib fractures as well. Prior healed left rib fractures are incidentally noted. IMPRESSION: 1. Acute fractures involving the right humeral neck and right lateral fifth through eighth ribs. 2. Chronic left suprahilar density unchanged since recent CT, compatible with known history of lung cancer. This could reflect post therapeutic changes in this region and continued follow-up is recommended. 3. Chronic scarring within the left lower lobe. Electronically Signed   By: Ozell Daring M.D.   On: 11/06/2023 17:21      Assessment/Plan: 78yoF with numerous medical comorbidities s/p ground level fall  R humerus; pubic rami fxs - as per orthopedics CXR suggested acute rib fractures, but this was not seen on CT. She has no significant chest wall pain to suggest broken ribs either We will continue to follow for tertiary evaluation in AM as well   I spent a total of 60 minutes today in both face-to-face and non-face-to-face activities to perform the following: review records, take and update history, examine the patient, counsel the patient on the diagnosis, and document encounter, findings, and plan in the EHR  for this visit on the date of this encounter.  Lonni Pizza, MD [facility]

## 2023-11-06 NOTE — Progress Notes (Signed)
 Orthopedic Tech Progress Note Patient Details:  Teresa Jensen May 13, 1945 [id number]  Ortho Devices Type of Ortho Device: Shoulder immobilizer Ortho Device/Splint Location: RUE Ortho Device/Splint Interventions: Ordered, Application, Adjustment   Post Interventions Patient Tolerated: Well Instructions Provided: Care of device, Adjustment of device  [name] 11/06/2023, 7:11 PM

## 2023-11-06 NOTE — ED Triage Notes (Signed)
 Pt BIB EMS and fell after walking out side door to house. Pt fell on right side. No LOC and no blood thinners. Pt complaining of right hip pain and right shoulder pain. Abrasion to right side of head. Pt is on home oxygen at 2L. EMS gave 50 of fentanyl  IM en route. Pt Aox4 and vitals stable at this time.    EMS Vitals BP 146/84 HR 106 SP02 98% CBG 122

## 2023-11-06 NOTE — ED Provider Notes (Signed)
 [place] [facility]MENT AT [facility] Provider Note   CSN: [id number] Arrival date & time: 11/06/23  1634     History No chief complaint on file.   HPI: NATANE HEWARD is a 78 y.o. 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 Patient arrived via EMS from home, accompanied by son and husband.  History provided by patient.  No interpreter required during this encounter.  Patient reports that workers were doing yard work and using a [employer] outside, and she wanted to watch the leaves, therefore she was trying to go outside of her front door, however she tripped and fell onto the ground.  Did not lose consciousness, denies headache, neck pain, back pain, does report pain in her right hip, as well as her chest wall.  Reports that she does not take any anticoagulants at home.  Was unable to get up on her own, EMS had to help her up.  Reports that she does use 1 to 2 L nasal cannula at baseline.  Patient's recorded medical, surgical, social, medication list and allergies were reviewed in the Snapshot window as part of the initial history.   Prior to Admission medications   Medication Sig Start Date End Date Taking? Authorizing Provider  alendronate  (FOSAMAX ) 70 MG tablet Take 70 mg by mouth every Sunday. Take with a full glass of water on an empty stomach.   Yes [provider]  Calcium  Carb-Cholecalciferol (CALCIUM  + VITAMIN D3 PO) Take 1,200 mg by mouth daily.   Yes [provider]  dapagliflozin propanediol  (FARXIGA ) 10 MG TABS tablet TAKE 1 TABLET BY MOUTH DAILY BEFORE BREAKFAST. 05/02/23  Yes Hackney, Tina A, FNP  famotidine  (PEPCID ) 20 MG tablet TAKE 1 TABLET BY MOUTH AFTER SUPPER Patient taking differently: Take 20 mg by mouth daily as needed for heartburn. 08/16/23  Yes Darlean Ozell NOVAK, MD  furosemide  (LASIX ) 40 MG tablet  Take 1 tablet (40 mg total) by mouth daily. Patient taking differently: Take 40 mg by mouth daily as needed for edema. 09/08/22 11/06/24 Yes Laurita Pillion, MD  Glycopyrrolate -Formoterol  (BEVESPI  AEROSPHERE) 9-4.8 MCG/ACT AERO Inhale 2 puffs into the lungs 2 (two) times daily. 10/08/22  Yes Tamea Dedra CROME, MD  levothyroxine  (SYNTHROID ) 75 MCG tablet TAKE 1 TABLET BY MOUTH EVERY MORNING ON EMPTY STOMACH WITH WATER ONLY,NO FOOD OR OTHER MED X30MINUTES 10/20/23  Yes Gretta Comer POUR, NP  losartan  (COZAAR ) 100 MG tablet TAKE 1 TABLET (100 MG TOTAL) BY MOUTH DAILY. PLEASE CALL [phone] TO SCHEDULE A YEARLY APPOINTMENT. THANK YOU. 11/15/22  Yes End, Lonni, MD  Multiple Vitamins-Minerals (PRESERVISION AREDS 2 PO) Take 1 tablet by mouth in the morning and at bedtime.   Yes [provider]  pantoprazole  (PROTONIX ) 40 MG tablet Take 1 tablet (40 mg total) by mouth daily. for heartburn. Patient taking differently: Take 40 mg by mouth daily as needed (for heartburn). for heartburn. 08/08/22  Yes Gretta Comer POUR, NP  pentoxifylline  (TRENTAL ) 400 MG CR tablet Take 1 tablet (400 mg total) by mouth in the morning and at bedtime. 10/14/23 04/11/24 Yes Claudene Lehmann, MD  spironolactone  (ALDACTONE ) 25 MG tablet Take 0.5 tablets (12.5 mg total) by mouth daily. 10/09/22  Yes Hackney, Ellouise A, FNP  tacrolimus  (PROTOPIC ) 0.1 % ointment Apply to aa's lower legs BID PRN. Patient taking differently: Apply 1 Application topically 2 (two) times daily as needed (for lower legs). 12/10/22  Yes Claudene Lehmann, MD  atorvastatin  (LIPITOR) 10 MG tablet TAKE 1 TABLET BY MOUTH EVERY DAY FOR CHOLESTEROL Patient not taking: Reported on 11/07/2023 08/16/23   Gretta Comer POUR, NP     Allergies: Epinephrine , Food, Mangifera indica, Prednisone , Ivp dye [iodinated contrast media], Lactose intolerance (gi), and Prevnar 13 [pneumococcal 13-val conj vacc]   Review of Systems   ROS as per HPI  Physical  Exam Updated Vital Signs BP (!) 114/39   Pulse 97   Temp 97.9 F (36.6 C) (Oral)   Resp 19   Ht 4' 9 (1.448 m)   Wt 72.6 kg   SpO2 97%   BMI 34.62 kg/m  Physical Exam Vitals and nursing note reviewed.  Constitutional:      General: She is not in acute distress.    Appearance: She is well-developed.  HENT:     Head: Normocephalic.     Comments: Abrasion to right sided anterior forehead Eyes:     Conjunctiva/sclera: Conjunctivae normal.  Cardiovascular:     Rate and Rhythm: Normal rate and regular rhythm.     Heart sounds: No murmur heard. Pulmonary:     Effort: Pulmonary effort is normal. No respiratory distress.     Breath sounds: Normal breath sounds.  Abdominal:     Palpations: Abdomen is soft.     Tenderness: There is no abdominal tenderness.  Musculoskeletal:        General: No swelling.     Cervical back: Neck supple.     Comments: Right shoulder tender to palpation, chest wall stable to AP and lateral compression, however tender on bilateral lateral compression.  Pelvis stable to AP and lateral compression, significant tenderness to palpation of right sided pelvis  Skin:    General: Skin is warm and dry.     Capillary Refill: Capillary refill takes less than 2 seconds.  Neurological:     Mental Status: She is alert.  Psychiatric:        Mood and Affect: Mood normal.     ED Course/ Medical Decision Making/ A&P   Procedures Procedures   Medications Ordered in ED Medications  pantoprazole  (PROTONIX ) injection 40 mg (has no administration in time range)  fentaNYL  (SUBLIMAZE ) injection 50 mcg (50 mcg Intravenous Given 11/06/23 1756)  ondansetron  (ZOFRAN ) injection 4 mg (4 mg Intravenous Given 11/06/23 1950)    Medical Decision Making:   Medical Decision Making:   Aeron Lheureux Manansala is a 78 y.o. female who presents for ground-level fall as per above.  Physical exam is pertinent for tenderness to palpation of right hip as well as bilateral lateral chest wall,  abrasion to right forehead, tenderness to palpation of right shoulder.   The differential includes but is not limited to ICH, TBI, skull fracture, spinal fracture/dislocation, blunt thoracic trauma, hemothorax, pneumothorax, rib fractures, blunt abdominal trauma, hemorrhage, extremity fracture, dislocation.  Independent historian: None  External data reviewed: Labs: reviewed prior labs for baseline  Labs: Ordered and Independent interpretation CBC: Leukocytosis to 18.1, consistent with stress to margination in the setting of trauma.  Hemoglobin 11.6, similar to baseline.  No thrombocytopenia.  Repeat CBC with stability, leukocytosis to 17.3, hemoglobin 11.1, no thrombocytopenia. CMP: CBC with mild elevation of creatinine and BUN, which appears to be baseline for patient.  No emergent electrolyte derangement or emergent LFT abnormality.  Radiology: Ordered, Independent interpretation, and All images reviewed independently.  Discrepancies in radiology report further elaborated on below, please see my discussion with Dr. Oneta. CT head: No  ICH or displaced skull fracture CT C-spine: No displaced fracture or dislocation CT CAP: No pneumothorax, hemothorax, intra-abdominal free air, significant intra-abdominal free fluid, overt bony displacement CXR: No acute cardiac or pulmonary abnormality. No appreciable rib fx. No PTX.  Pelvis XR: Pelvic ring intact. No acute fx. Both hips located.  CT HEAD WO CONTRAST Result Date: 11/06/2023 EXAM: CT HEAD WITHOUT CONTRAST 11/06/2023 07:27:00 PM TECHNIQUE: CT of the head was performed without the administration of intravenous contrast. Automated exposure control, iterative reconstruction, and/or weight based adjustment of the mA/kV was utilized to reduce the radiation dose to as low as reasonably achievable. COMPARISON: None available. CLINICAL HISTORY: Head trauma, moderate-severe FINDINGS: BRAIN AND VENTRICLES: No acute hemorrhage. No evidence of acute infarct.  No hydrocephalus. No extra-axial collection. No mass effect or midline shift. ORBITS: No acute abnormality. SINUSES: No acute abnormality. SOFT TISSUES AND SKULL: No acute soft tissue abnormality. No skull fracture. IMPRESSION: 1. No acute intracranial abnormality or skull fracture. Electronically signed by: Maude Stammer MD 11/06/2023 07:47 PM EST RP Workstation: [id number]   CT CERVICAL SPINE WO CONTRAST Result Date: 11/06/2023 EXAM: CT CERVICAL SPINE WITHOUT CONTRAST 11/06/2023 07:27:00 PM TECHNIQUE: CT of the cervical spine was performed without the administration of intravenous contrast. Multiplanar reformatted images are provided for review. Automated exposure control, iterative reconstruction, and/or weight based adjustment of the mA/kV was utilized to reduce the radiation dose to as low as reasonably achievable. COMPARISON: None available. CLINICAL HISTORY: Polytrauma, blunt FINDINGS: CERVICAL SPINE: BONES AND ALIGNMENT: Normal alignment of the cervical vertebral bodies. No acute cervical spine fracture is identified. Surgical changes related to prior C4-C5 and C5-C6 anterior interbody fusion. No complicating features are identified. The facets are normally aligned. No fractures. DEGENERATIVE CHANGES: The spinal canal is fairly generous. No significant canal stenosis. Small focal calcified disc protrusion noted at C3-C4 with mass effect on the central aspect of the thecal sac. There is also moderate left foraminal stenosis at this level. SOFT TISSUES: No abnormal prevertebral soft tissue swelling. No neck mass, adenopathy, or hematoma. The lung apices are grossly clear. Advanced emphysematous changes and pulmonary scarring noted. IMPRESSION: 1. No acute cervical spine fracture. 2. Small calcified disc protrusion at C3-4 with thecal sac mass effect and moderate left foraminal stenosis. 3. Postsurgical C4-5 and C5-6 anterior interbody fusion without complicating features. Electronically signed by: Maude Stammer MD 11/06/2023 07:46 PM EST RP Workstation: [id number]   CT CHEST ABDOMEN PELVIS WO CONTRAST Result Date: 11/06/2023 EXAM: CT CHEST, ABDOMEN AND PELVIS WITHOUT CONTRAST 11/06/2023 07:27:00 PM TECHNIQUE: CT of the chest, abdomen and pelvis was performed without the administration of intravenous contrast. Multiplanar reformatted images are provided for review. Automated exposure control, iterative reconstruction, and/or weight based adjustment of the mA/kV was utilized to reduce the radiation dose to as low as reasonably achievable. COMPARISON: Prior chest CT 07/29/2023 and prior abdominal/pelvic CT scan 12/20/2022. CLINICAL HISTORY: FINDINGS: CHEST: MEDIASTINUM AND LYMPH NODES: The heart is normal in size. No pericardial effusion. Stable tortuosity and calcification of the thoracic aorta and branch vessels. Stable 3-vessel coronary artery calcifications. Stable scattered mediastinal and hilar lymph nodes but no new or progressive findings. The central airways are clear. The esophagus is grossly normal. LUNGS AND PLEURA: Progressive medial left upper lobe soft tissue density most likely reflecting progressive radiation fibrosis could not totally exclude the possibility of recurrent tumor. Stable advanced emphysematous changes and pulmonary scarring. Stable 7 mm nodular lesion in the left lower lobe on image 57/06. Progressive nodularity along the nearby  major fissure is indeterminate. No new pulmonary nodules. No acute pulmonary process. No pulmonary contusion, pneumothorax, or pleural effusion. ABDOMEN AND PELVIS: LIVER: No acute hepatic injury or perihepatic fluid collection. GALLBLADDER AND BILE DUCTS: The gallbladder is surgically absent. Common bile duct dilatation. SPLEEN: No acute splenic injury or perisplenic fluid collection. PANCREAS: No acute pancreatic injury or peripancreatic fluid collection. ADRENAL GLANDS: Stable bilateral adrenal gland nodules consistent with benign adenomas. KIDNEYS,  URETERS AND BLADDER: No renal lesions or hydronephrosis. No perinephric hematoma. No stones in the kidneys or ureters. No perinephric or periureteral stranding. The bladder is unremarkable. GI AND BOWEL: The stomach, duodenum, small bowel, and colon are grossly normal without oral contrast. No inflammatory changes, mass lesions, or obstructive findings. The terminal ileum is normal. Colonic diverticulosis again noted. REPRODUCTIVE ORGANS: The uterus and ovaries are surgically absent. PERITONEUM AND RETROPERITONEUM: No ascites. No free air. No mesenteric or retroperitoneal mass, adenopathy, or hematoma. VASCULATURE: Advanced atherosclerotic calcification involving the aorta and branch vessels but no aneurysm. ABDOMINAL AND PELVIS LYMPH NODES: No lymphadenopathy. BONES AND SOFT TISSUES: The bony thorax is intact. No acute sternal, rib, or thoracic vertebral body fractures. No chest wall contusion or breast mass. Superior and inferior pubic rami fractures on the right side. Both hips are normally located. No hip fractures. The pubic symphysis and SI joints are intact. The lumbar vertebral bodies are intact. Right-sided extraperitoneal pelvic hematoma surrounding the bladder, findings secondary to right-sided pubic rami fractures. IMPRESSION: 1. Progressive medial left upper lobe soft tissue density most consistent with progressive radiation fibrosis; recurrent tumor cannot be excluded. Recommend short-interval chest CT follow-up or PET/CT and oncologic evaluation as indicated. 2. Stable 7 mm left lower lobe pulmonary nodule. 3. Progressive indeterminate nodularity along the adjacent major fissure. Recommend short-interval chest CT follow-up to assess for stability. 4. Right-sided extraperitoneal pelvic hematoma surrounding the bladder, secondary to right-sided pubic rami fractures. Electronically signed by: Maude Stammer MD 11/06/2023 07:42 PM EST RP Workstation: [id number]   DG Pelvis Portable Result Date:  11/06/2023 CLINICAL DATA:  Clemens, pain EXAM: PORTABLE PELVIS 1-2 VIEWS COMPARISON:  12/20/2022 FINDINGS: Supine frontal view of the pelvis was obtained, the patient is slightly rotated toward the left. No acute displaced fracture, subluxation, or dislocation. Joint spaces are relatively well preserved. Prominent atherosclerosis of the aorta and its branches. IMPRESSION: 1. No acute displaced fracture. Electronically Signed   By: Ozell Daring M.D.   On: 11/06/2023 17:22   DG Chest Port 1 View Result Date: 11/06/2023 CLINICAL DATA:  Clemens, right shoulder pain EXAM: PORTABLE CHEST 1 VIEW COMPARISON:  09/06/2022 FINDINGS: Frontal view of the chest was obtained, excluding the right costophrenic angle by collimation. The cardiac silhouette is unremarkable. Increased density in the left suprahilar region compatible with patient's known history of lung cancer, may reflect post therapeutic change given findings on recent CT. Chronic consolidation within the periphery of the superior segment of the left lower lobe again noted and stable. No new airspace disease, effusion, or pneumothorax. There is a displaced impacted fracture through the right humeral neck, incompletely characterized on this exam due to collimation. There are acute displaced right lateral fifth through eighth rib fractures as well. Prior healed left rib fractures are incidentally noted. IMPRESSION: 1. Acute fractures involving the right humeral neck and right lateral fifth through eighth ribs. 2. Chronic left suprahilar density unchanged since recent CT, compatible with known history of lung cancer. This could reflect post therapeutic changes in this region and continued follow-up is recommended. 3.  Chronic scarring within the left lower lobe. Electronically Signed   By: Ozell Daring M.D.   On: 11/06/2023 17:21    EKG/Medicine tests: Not indicated EKG Interpretation:                  Interventions: Fentanyl , Zofran   See the EMR for full details  regarding lab and imaging results.  Currently, patient is awake, alert, and protecting own airway and is hemodynamically stable.  Given polytrauma with multiple areas of tenderness to palpation, do feel that patient requires CT chest abdomen pelvis to evaluate for bony injuries that are not fully elucidated on plain films.  Additionally patient is at risk per the [place] CT head and CT C-spine rule due to age, therefore will obtain CT head and CT C-spine.  Labs overall reassuring, anemia is similar to patient's baseline, no new different electrolyte derangements or change in renal function.  Patient does have a contrast allergy, therefore noncontrasted scans obtained.  CT of the abdomen pelvis notes pelvic fracture, however does not note any rib fractures, which is a change from the patient's chest x-ray which notes acute fractures of ribs 5 through 8 on the right side.  I did call [facility] radiology to speak with the reading radiologist, unfortunately Dr. Marven (radiologist who read the patient's CT chest abdomen pelvis) was no longer available by the time of my call, therefore I spoke with Dr. Maple regarding the discrepancy between the chest x-ray read as well as the CT read, on her reevaluation she does note fractures of right ribs 5 through 8, as well as age-indeterminate fractures of left ribs 5 through 7.  Given patient does have tenderness bilaterally in these areas, I do feel that patient has acute fractures bilaterally.  Given patient has pubic ramus fracture with adjacent hematoma, repeat CBC obtained to evaluate for stability of hemoglobin given noncontrasted scan was obtained in the setting of patient's contrast allergy.  Hemoglobin was stable at 11.1.  Orthopedic surgery consulted for patient's humerus fracture as well as pubic ramus fracture, spoke with Dr. Lajuana, who states that orthopedic surgery will follow the patient.  Patient was placed in a sling in her right upper  extremity for her proximal humerus fracture.    Additionally I spoke with Dr. Teresa with trauma surgery given patient has 11 rib fractures.  Notably patient does use home oxygen, is requiring 3 L nasal cannula in the emergency department which is increased from her 1 L baseline requirement, therefore do feel that patient warrants admission for monitoring, pulmonary toilet, and pain control.  Dr. Teresa agrees with plan for admission, however given patient's chronic medical history, feels that patient is more appropriate for medicine admission with trauma consult.  Trauma will follow while patient is admitted.  Given medical history, hospitalist consulted, patient was accepted to hospitalist service by Dr. Silvester.  No additional acute events while patient was under my care.  Presentation is most consistent with acute complicated illness and Current presentation is complicated by underlying chronic conditions  Discussion of management or test interpretations with external provider(s): Dr. Teresa, trauma, Dr. Silvester, hospitalist, Dr. Lajuana, Ortho  Risk Drugs:Prescription drug management and Parenteral controlled substances Treatment: Decision regarding hospitalization  Disposition: ADMIT: I believe the patient requires admission for further care and management. The patient was admitted to hospitalist with trauma and Ortho consult. Please see inpatient provider note for additional treatment plan details.   MDM generated using voice dictation software and may contain dictation errors.  Please contact me for any clarification or with any questions.   Clinical Impression:  1. Ground-level fall   2. Closed fracture of multiple ribs of both sides, initial encounter   3. Other closed displaced fracture of proximal end of right humerus, initial encounter   4. Closed fracture of ramus of right pubis, initial encounter (HCC)      Admit   Final Clinical Impression(s) / ED Diagnoses Final  diagnoses:  Ground-level fall  Closed fracture of multiple ribs of both sides, initial encounter  Other closed displaced fracture of proximal end of right humerus, initial encounter  Closed fracture of ramus of right pubis, initial encounter [facility])    Rx / DC Orders ED Discharge Orders     None        Rogelia Jerilynn RAMAN, MD 11/07/23 [id number]

## 2023-11-06 NOTE — ED Notes (Signed)
 [facility] called

## 2023-11-06 NOTE — Subjective & Objective (Signed)
 Had a mechanical fall today tripped on door step Resulting in multiple fractures including multiple bilateral rib fractures Right pubic ramus with hematoma in the pelvis Right humeral head  Trauma surgery has been consulted Dr Teresa With HG stable Not on any blood thinners at this time Orthopedics has been consulted Dr. Edna  Requiring higher level of  o2 up to 3-4 L

## 2023-11-06 NOTE — H&P (Signed)
 Teresa Jensen FMW:[id number] DOB: 1945-03-24 DOA: 11/06/2023     PCP: Gretta Comer POUR, NP   Outpatient Specialists:   CARDS:  Dr. Lonni Hanson, MD  Pulmonology Dedra Sanders radOncology Marcey Penton   Patient arrived to ER on 11/06/23 at 1634 Referred by Attending Rogelia Jerilynn RAMAN, MD   Patient coming from:    home Lives   With family     Chief Complaint: fall   HPI: Teresa Jensen is a 78 y.o. 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 Edna  Requiring higher level of  o2 up to 3-4 L      Denies significant ETOH intake   Does not smoke      Regarding pertinent Chronic problems:    Hyperlipidemia -  on statins Lipitor (atorvastatin )  Lipid Panel     Component Value Date/Time   CHOL 124 05/30/2023 0852   TRIG 71 05/30/2023 0852   HDL 60 05/30/2023 0852   CHOLHDL 2.1 05/30/2023 0852   VLDL 14 05/30/2023 0852   LDLCALC 50 05/30/2023 0852     HTN on lasix , cozaar , spironolactone ,    chronic CHF diastolic - last echo  Recent Results (from the past 56199 hours)  ECHOCARDIOGRAM COMPLETE   Collection Time: 09/07/22 11:35 AM  Result Value   Weight 2,944   Height 57   BP 123/55   Ao pk vel 2.32    AV Area VTI 1.68   AR max vel 1.48   AV Mean grad 10.5   AV Peak grad 21.4   Single Plane A2C EF 58.9   Single Plane A4C EF 50.7   Calc EF 55.6   S' Lateral 3.10   AV Area mean vel 1.58   Area-P 1/2 2.82   MV VTI 2.53   Est EF 55 - 60%   Narrative      ECHOCARDIOGRAM REPORT      IMPRESSIONS    1. Left ventricular ejection fraction, by estimation, is 55 to 60%. Left ventricular ejection fraction by 2D MOD biplane is 55.6 %. The left ventricle has normal function. The left ventricle has no regional wall motion abnormalities. Left ventricular  diastolic parameters are indeterminate.  2. Right ventricular systolic function is normal. The right ventricular size is normal. There is mildly elevated pulmonary artery systolic pressure.  3. Left atrial size was mildly dilated.  4. The mitral valve is normal in structure. Mild mitral valve regurgitation.  5. The aortic valve is calcified. Aortic valve regurgitation is not visualized. Mild aortic valve stenosis. Aortic valve mean gradient measures 10.5 mmHg.  6. The inferior vena cava is normal in size with <50% respiratory variability, suggesting right atrial pressure of 8 mmHg.     *Note: Due to a large number of results and/or encounters for the  requested time period, some results have not been displayed. A complete set of results can be found in Results Review.      Hypothyroidism:   Lab Results  Component Value Date   TSH 1.15 08/13/2023   on synthroid     Morbid obesity-   BMI Readings from Last 1 Encounters:  11/06/23 34.62 kg/m      COPD -  followed by pulmonology  on baseline oxygen  1-2L,         CKD stage IIIa  baseline Cr 1.1 Estimated Creatinine Clearance: 34.7 mL/min (A) (by C-G formula based on SCr of 1.1 mg/dL (H)).  Lab Results  Component Value Date   CREATININE 1.10 (H) 11/06/2023   CREATININE 1.20 (H) 11/06/2023   CREATININE 1.15 (H) 11/06/2023   Lab Results  Component Value Date   NA 140 11/06/2023    CL 106 11/06/2023   K 4.6 11/06/2023   CO2 24 11/06/2023   BUN 30 (H) 11/06/2023   CREATININE 1.10 (H) 11/06/2023   GFRNONAA 49 (L) 11/06/2023   CALCIUM  9.0 11/06/2023   ALBUMIN 3.3 (L) 11/06/2023   GLUCOSE 139 (H) 11/06/2023    Chronic anemia - baseline hg Hemoglobin & Hematocrit  Recent Labs    11/06/23 1811 11/06/23 2110 11/06/23 2122  HGB 11.9* 11.1* 11.9*   Iron/TIBC/Ferritin/ %Sat    Component Value Date/Time   IRON 24 (L) 01/12/2017 1013   TIBC 288 01/12/2017 1013   FERRITIN 85 01/12/2017 1013   IRONPCTSAT 8 (L) 01/12/2017 1013    Cancer: lung ca sp radiation     While in ER: Clinical Course as of 11/06/23 2245  Wed Nov 06, 2023  2208 guttmann [LS]  2208 5-8 R, 5-7 L [LS]    Clinical Course User Index [LS] Rogelia Jerilynn RAMAN, MD       Lab Orders         Comprehensive metabolic panel         CBC WITH DIFFERENTIAL         CBC         I-Stat Chem 8, ED         I-stat chem 8, ED (not at [facility], [facility] or [facility])      CT HEAD   NON acute   Small calcified disc protrusion at C3-4 with thecal sac mass effect and moderate left foraminal stenosis.  CXR -  right humeral neck and right lateral fifth through eighth ribs.  CTabd/pelvis -    chest   Right-sided extraperitoneal pelvic hematoma surrounding the bladder, secondary to right-sided pubic rami fractures.  Following Medications were ordered in ER: Medications  fentaNYL  (SUBLIMAZE ) injection 50 mcg (50 mcg Intravenous Given 11/06/23 1756)  ondansetron  (ZOFRAN ) injection 4 mg (4 mg Intravenous Given 11/06/23 1950)    _______________________________________________________ ER Provider Called:       Trauma surgery Dr. Teresa They Recommend admit to medicine       ED Triage Vitals  Encounter Vitals Group     BP 11/06/23 1645 131/84     Girls Systolic BP Percentile --      Girls Diastolic BP Percentile --      Boys Systolic BP Percentile --      Boys Diastolic BP Percentile --      Pulse Rate 11/06/23 1645  (!) 116     Resp 11/06/23 1647 18     Temp 11/06/23 1647 98.4 F (36.9 C)     Temp Source 11/06/23 1647 Oral  SpO2 11/06/23 1645 95 %     Weight 11/06/23 1640 160 lb (72.6 kg)     Height 11/06/23 1640 4' 9 (1.448 m)     Head Circumference --      Peak Flow --      Pain Score 11/06/23 1756 9     Pain Loc --      Pain Education --      Exclude from Growth Chart --   UFJK(75)@     _________________________________________ Significant initial  Findings: Abnormal Labs Reviewed  COMPREHENSIVE METABOLIC PANEL WITH GFR - Abnormal; Notable for the following components:      Result Value   Glucose, Bld 140 (*)    BUN 30 (*)    Creatinine, Ser 1.15 (*)    Albumin 3.3 (*)    GFR, Estimated 49 (*)    All other components within normal limits  CBC WITH DIFFERENTIAL/PLATELET - Abnormal; Notable for the following components:   WBC 18.1 (*)    RBC 3.84 (*)    Hemoglobin 11.6 (*)    Neutro Abs 15.5 (*)    Monocytes Absolute 1.3 (*)    Abs Immature Granulocytes 0.16 (*)    All other components within normal limits  CBC - Abnormal; Notable for the following components:   WBC 17.3 (*)    RBC 3.69 (*)    Hemoglobin 11.1 (*)    HCT 35.0 (*)    All other components within normal limits  I-STAT CHEM 8, ED - Abnormal; Notable for the following components:   BUN 36 (*)    Creatinine, Ser 1.20 (*)    Glucose, Bld 138 (*)    Hemoglobin 11.9 (*)    HCT 35.0 (*)    All other components within normal limits  I-STAT CHEM 8, ED - Abnormal; Notable for the following components:   BUN 30 (*)    Creatinine, Ser 1.10 (*)    Glucose, Bld 139 (*)    Hemoglobin 11.9 (*)    HCT 35.0 (*)    All other components within normal limits     ECG: Ordered Personally reviewed and interpreted by me showing: HR : 99 Rhythm: NSR,     no evidence of ischemic changes QTC 455    The recent clinical data is shown below. Vitals:   11/06/23 2130 11/06/23 2145 11/06/23 2200 11/06/23 2215  BP: (!) 114/46  (!) 122/95 (!) 124/51 (!) 126/37  Pulse: 90 89 92 93  Resp: 20 (!) 22 (!) 22 18  Temp:      TempSrc:      SpO2: 96% 97% 96% 93%  Weight:      Height:        WBC     Component Value Date/Time   WBC 17.3 (H) 11/06/2023 2110   LYMPHSABS 1.0 11/06/2023 1800   LYMPHSABS 1.6 05/30/2022 0916   MONOABS 1.3 (H) 11/06/2023 1800   EOSABS 0.0 11/06/2023 1800   EOSABS 0.1 05/30/2022 0916   BASOSABS 0.1 11/06/2023 1800   BASOSABS 0.1 05/30/2022 9083    Results for orders placed or performed in visit on 11/21/22  Urine Culture     Status: Abnormal   Collection Time: 11/21/22  4:12 PM   Specimen: Urine  Result Value Ref Range Status   MICRO NUMBER: [id number]  Final   SPECIMEN QUALITY: Adequate  Final   Sample Source URINE  Final   STATUS: FINAL  Final   ISOLATE 1: Klebsiella pneumoniae (A)  Final  Comment: 10,000-49,000 CFU/mL of Klebsiella pneumoniae      Susceptibility   Klebsiella pneumoniae - URINE CULTURE, REFLEX    AMOX/CLAVULANIC <=2 Sensitive     AMPICILLIN 16 Resistant     AMPICILLIN/SULBACTAM <=2 Sensitive     CEFAZOLIN* <=4 Not Reportable      * For infections other than uncomplicated UTI caused by E. coli, K. pneumoniae or P. mirabilis: Cefazolin is resistant if MIC > or = 8 mcg/mL. (Distinguishing susceptible versus intermediate for isolates with MIC < or = 4 mcg/mL requires additional testing.) For uncomplicated UTI caused by E. coli, K. pneumoniae or P. mirabilis: Cefazolin is susceptible if MIC <32 mcg/mL and predicts susceptible to the oral agents cefaclor, cefdinir, cefpodoxime, cefprozil, cefuroxime, cephalexin  and loracarbef.     CEFTAZIDIME <=1 Sensitive     CEFEPIME <=1 Sensitive     CEFTRIAXONE  <=1 Sensitive     CIPROFLOXACIN <=0.25 Sensitive     LEVOFLOXACIN  <=0.12 Sensitive     GENTAMICIN <=1 Sensitive     IMIPENEM <=0.25 Sensitive     NITROFURANTOIN 64 Intermediate     PIP/TAZO <=4 Sensitive     TOBRAMYCIN <=1 Sensitive     TRIMETH /SULFA * <=20  Sensitive      * For infections other than uncomplicated UTI caused by E. coli, K. pneumoniae or P. mirabilis: Cefazolin is resistant if MIC > or = 8 mcg/mL. (Distinguishing susceptible versus intermediate for isolates with MIC < or = 4 mcg/mL requires additional testing.) For uncomplicated UTI caused by E. coli, K. pneumoniae or P. mirabilis: Cefazolin is susceptible if MIC <32 mcg/mL and predicts susceptible to the oral agents cefaclor, cefdinir, cefpodoxime, cefprozil, cefuroxime, cephalexin  and loracarbef. Legend: S = Susceptible  I = Intermediate R = Resistant  NS = Not susceptible SDD = Susceptible Dose Dependent * = Not Tested  NR = Not Reported **NN = See Therapy Comments      __________________________________________________________ Recent Labs  Lab 11/06/23 1800 11/06/23 1811 11/06/23 2122  NA 139 140 140  K 4.1 4.3 4.6  CO2 24  --   --   GLUCOSE 140* 138* 139*  BUN 30* 36* 30*  CREATININE 1.15* 1.20* 1.10*  CALCIUM  9.0  --   --     Cr stable,    Lab Results  Component Value Date   CREATININE 1.10 (H) 11/06/2023   CREATININE 1.20 (H) 11/06/2023   CREATININE 1.15 (H) 11/06/2023    Recent Labs  Lab 11/06/23 1800  AST 20  ALT 18  ALKPHOS 63  BILITOT 0.5  PROT 7.3  ALBUMIN 3.3*   Lab Results  Component Value Date   CALCIUM  9.0 11/06/2023    Plt: Lab Results  Component Value Date   PLT 321 11/06/2023    Recent Labs  Lab 11/06/23 1800 11/06/23 1811 11/06/23 2110 11/06/23 2122  WBC 18.1*  --  17.3*  --   NEUTROABS 15.5*  --   --   --   HGB 11.6* 11.9* 11.1* 11.9*  HCT 36.7 35.0* 35.0* 35.0*  MCV 95.6  --  94.9  --   PLT 331  --  321  --     HG/HCT  stable,     Component Value Date/Time   HGB 11.9 (L) 11/06/2023 2122   HGB 12.0 05/30/2022 0916   HCT 35.0 (L) 11/06/2023 2122   HCT 38.0 05/30/2022 0916   MCV 94.9 11/06/2023 2110   MCV 88 05/30/2022 0916    _______________________________________________ Hospitalist was called  for admission for  multiple trauma fractures pain control   The following Work up has been ordered so far:  Orders Placed This Encounter  Procedures   DG Chest Port 1 View   DG Pelvis Portable   CT HEAD WO CONTRAST   CT CERVICAL SPINE WO CONTRAST   CT CHEST ABDOMEN PELVIS WO CONTRAST   Comprehensive metabolic panel   CBC WITH DIFFERENTIAL   CBC   Diet NPO time specified   ED Cardiac monitoring   Measure blood pressure   Initiate Carrier Fluid Protocol   Apply shoulder immobilizer/sling   Consult to trauma surgery   Consult to orthopedic surgery   Consult to hospitalist   ED Pulse oximetry, continuous   I-Stat Chem 8, ED   I-stat chem 8, ED (not at [facility], [facility] or [facility])     OTHER Significant initial  Findings:  labs showing:     DM  labs:  HbA1C: Recent Labs    08/13/23 0904  HGBA1C 5.4       CBG (last 3)  No results for input(s): GLUCAP in the last 72 hours.        Cultures:    Component Value Date/Time   SDES  10/04/2020 0854    URINE, CLEAN CATCH Performed at [facility], [address] [place], [name] [address]    [facility]  10/04/2020 [phone]    NONE Performed at [facility], [address] [place], [name] [address]    CULT >=100,000 COLONIES/mL CITROBACTER KOSERI (A) 10/04/2020 0854   REPTSTATUS 10/06/2020 FINAL 10/04/2020 0854     Radiological Exams on Admission: CT HEAD WO CONTRAST Result Date: 11/06/2023 EXAM: CT HEAD WITHOUT CONTRAST 11/06/2023 07:27:00 PM TECHNIQUE: CT of the head was performed without the administration of intravenous contrast. Automated exposure control, iterative reconstruction, and/or weight based adjustment of the mA/kV was utilized to reduce the radiation dose to as low as reasonably achievable. COMPARISON: None available. CLINICAL HISTORY: Head trauma, moderate-severe FINDINGS: BRAIN AND VENTRICLES: No acute hemorrhage. No evidence of acute infarct. No hydrocephalus. No extra-axial collection. No  mass effect or midline shift. ORBITS: No acute abnormality. SINUSES: No acute abnormality. SOFT TISSUES AND SKULL: No acute soft tissue abnormality. No skull fracture. IMPRESSION: 1. No acute intracranial abnormality or skull fracture. Electronically signed by: Maude Stammer MD 11/06/2023 07:47 PM EST RP Workstation: [id number]   CT CERVICAL SPINE WO CONTRAST Result Date: 11/06/2023 EXAM: CT CERVICAL SPINE WITHOUT CONTRAST 11/06/2023 07:27:00 PM TECHNIQUE: CT of the cervical spine was performed without the administration of intravenous contrast. Multiplanar reformatted images are provided for review. Automated exposure control, iterative reconstruction, and/or weight based adjustment of the mA/kV was utilized to reduce the radiation dose to as low as reasonably achievable. COMPARISON: None available. CLINICAL HISTORY: Polytrauma, blunt FINDINGS: CERVICAL SPINE: BONES AND ALIGNMENT: Normal alignment of the cervical vertebral bodies. No acute cervical spine fracture is identified. Surgical changes related to prior C4-C5 and C5-C6 anterior interbody fusion. No complicating features are identified. The facets are normally aligned. No fractures. DEGENERATIVE CHANGES: The spinal canal is fairly generous. No significant canal stenosis. Small focal calcified disc protrusion noted at C3-C4 with mass effect on the central aspect of the thecal sac. There is also moderate left foraminal stenosis at this level. SOFT TISSUES: No abnormal prevertebral soft tissue swelling. No neck mass, adenopathy, or hematoma. The lung apices are grossly clear. Advanced emphysematous changes and pulmonary scarring noted. IMPRESSION: 1. No acute cervical spine fracture. 2. Small calcified disc protrusion at C3-4 with  thecal sac mass effect and moderate left foraminal stenosis. 3. Postsurgical C4-5 and C5-6 anterior interbody fusion without complicating features. Electronically signed by: Maude Stammer MD 11/06/2023 07:46 PM EST RP  Workstation: [id number]   CT CHEST ABDOMEN PELVIS WO CONTRAST Result Date: 11/06/2023 EXAM: CT CHEST, ABDOMEN AND PELVIS WITHOUT CONTRAST 11/06/2023 07:27:00 PM TECHNIQUE: CT of the chest, abdomen and pelvis was performed without the administration of intravenous contrast. Multiplanar reformatted images are provided for review. Automated exposure control, iterative reconstruction, and/or weight based adjustment of the mA/kV was utilized to reduce the radiation dose to as low as reasonably achievable. COMPARISON: Prior chest CT 07/29/2023 and prior abdominal/pelvic CT scan 12/20/2022. CLINICAL HISTORY: FINDINGS: CHEST: MEDIASTINUM AND LYMPH NODES: The heart is normal in size. No pericardial effusion. Stable tortuosity and calcification of the thoracic aorta and branch vessels. Stable 3-vessel coronary artery calcifications. Stable scattered mediastinal and hilar lymph nodes but no new or progressive findings. The central airways are clear. The esophagus is grossly normal. LUNGS AND PLEURA: Progressive medial left upper lobe soft tissue density most likely reflecting progressive radiation fibrosis could not totally exclude the possibility of recurrent tumor. Stable advanced emphysematous changes and pulmonary scarring. Stable 7 mm nodular lesion in the left lower lobe on image 57/06. Progressive nodularity along the nearby major fissure is indeterminate. No new pulmonary nodules. No acute pulmonary process. No pulmonary contusion, pneumothorax, or pleural effusion. ABDOMEN AND PELVIS: LIVER: No acute hepatic injury or perihepatic fluid collection. GALLBLADDER AND BILE DUCTS: The gallbladder is surgically absent. Common bile duct dilatation. SPLEEN: No acute splenic injury or perisplenic fluid collection. PANCREAS: No acute pancreatic injury or peripancreatic fluid collection. ADRENAL GLANDS: Stable bilateral adrenal gland nodules consistent with benign adenomas. KIDNEYS, URETERS AND BLADDER: No renal lesions or  hydronephrosis. No perinephric hematoma. No stones in the kidneys or ureters. No perinephric or periureteral stranding. The bladder is unremarkable. GI AND BOWEL: The stomach, duodenum, small bowel, and colon are grossly normal without oral contrast. No inflammatory changes, mass lesions, or obstructive findings. The terminal ileum is normal. Colonic diverticulosis again noted. REPRODUCTIVE ORGANS: The uterus and ovaries are surgically absent. PERITONEUM AND RETROPERITONEUM: No ascites. No free air. No mesenteric or retroperitoneal mass, adenopathy, or hematoma. VASCULATURE: Advanced atherosclerotic calcification involving the aorta and branch vessels but no aneurysm. ABDOMINAL AND PELVIS LYMPH NODES: No lymphadenopathy. BONES AND SOFT TISSUES: The bony thorax is intact. No acute sternal, rib, or thoracic vertebral body fractures. No chest wall contusion or breast mass. Superior and inferior pubic rami fractures on the right side. Both hips are normally located. No hip fractures. The pubic symphysis and SI joints are intact. The lumbar vertebral bodies are intact. Right-sided extraperitoneal pelvic hematoma surrounding the bladder, findings secondary to right-sided pubic rami fractures. IMPRESSION: 1. Progressive medial left upper lobe soft tissue density most consistent with progressive radiation fibrosis; recurrent tumor cannot be excluded. Recommend short-interval chest CT follow-up or PET/CT and oncologic evaluation as indicated. 2. Stable 7 mm left lower lobe pulmonary nodule. 3. Progressive indeterminate nodularity along the adjacent major fissure. Recommend short-interval chest CT follow-up to assess for stability. 4. Right-sided extraperitoneal pelvic hematoma surrounding the bladder, secondary to right-sided pubic rami fractures. Electronically signed by: Maude Stammer MD 11/06/2023 07:42 PM EST RP Workstation: [id number]   DG Pelvis Portable Result Date: 11/06/2023 CLINICAL DATA:  Clemens, pain EXAM:  PORTABLE PELVIS 1-2 VIEWS COMPARISON:  12/20/2022 FINDINGS: Supine frontal view of the pelvis was obtained, the patient is slightly rotated toward the left. No  acute displaced fracture, subluxation, or dislocation. Joint spaces are relatively well preserved. Prominent atherosclerosis of the aorta and its branches. IMPRESSION: 1. No acute displaced fracture. Electronically Signed   By: Ozell Daring M.D.   On: 11/06/2023 17:22   DG Chest Port 1 View Result Date: 11/06/2023 CLINICAL DATA:  Clemens, right shoulder pain EXAM: PORTABLE CHEST 1 VIEW COMPARISON:  09/06/2022 FINDINGS: Frontal view of the chest was obtained, excluding the right costophrenic angle by collimation. The cardiac silhouette is unremarkable. Increased density in the left suprahilar region compatible with patient's known history of lung cancer, may reflect post therapeutic change given findings on recent CT. Chronic consolidation within the periphery of the superior segment of the left lower lobe again noted and stable. No new airspace disease, effusion, or pneumothorax. There is a displaced impacted fracture through the right humeral neck, incompletely characterized on this exam due to collimation. There are acute displaced right lateral fifth through eighth rib fractures as well. Prior healed left rib fractures are incidentally noted. IMPRESSION: 1. Acute fractures involving the right humeral neck and right lateral fifth through eighth ribs. 2. Chronic left suprahilar density unchanged since recent CT, compatible with known history of lung cancer. This could reflect post therapeutic changes in this region and continued follow-up is recommended. 3. Chronic scarring within the left lower lobe. Electronically Signed   By: Ozell Daring M.D.   On: 11/06/2023 17:21   _______________________________________________________________________________________________________ Latest  Blood pressure (!) 126/37, pulse 93, temperature 98.4 F (36.9 C),  temperature source Oral, resp. rate 18, height 4' 9 (1.448 m), weight 72.6 kg, SpO2 93%.   Vitals  labs and radiology finding personally reviewed  Review of Systems:    Pertinent positives include: fall chest pain,  Constitutional:  No weight loss, night sweats, Fevers, chills, fatigue, weight loss  HEENT:  No headaches, Difficulty swallowing,Tooth/dental problems,Sore throat,  No sneezing, itching, ear ache, nasal congestion, post nasal drip,  Cardio-vascular:  No  Orthopnea, PND, anasarca, dizziness, palpitations.no Bilateral lower extremity swelling  GI:  No heartburn, indigestion, abdominal pain, nausea, vomiting, diarrhea, change in bowel habits, loss of appetite, melena, blood in stool, hematemesis Resp:  no shortness of breath at rest. No dyspnea on exertion, No excess mucus, no productive cough, No non-productive cough, No coughing up of blood.No change in color of mucus.No wheezing. Skin:  no rash or lesions. No jaundice GU:  no dysuria, change in color of urine, no urgency or frequency. No straining to urinate.  No flank pain.  Musculoskeletal:  No joint pain or no joint swelling. No decreased range of motion. No back pain.  Psych:  No change in mood or affect. No depression or anxiety. No memory loss.  Neuro: no localizing neurological complaints, no tingling, no weakness, no double vision, no gait abnormality, no slurred speech, no confusion  All systems reviewed and apart from HOPI all are negative _______________________________________________________________________________________________ Past Medical History:   Past Medical History:  Diagnosis Date   (HFpEF) heart failure with preserved ejection fraction (HCC) 06/28/2011   a.) TTE 06/28/2011: EF 55-60%, mild AR, G1DD; b.) TTE 01/16/2013: EF 55-60%, mild MR; c.) TTE 02/16/2018: EF 60-65%, G1DD; d.) MPI 11/26/2018: EF 56%   Abnormal electrocardiogram 11/12/2018   Acute kidney failure 01/12/2017   Acute on  chronic respiratory failure with hypoxia (HCC) 09/06/2022   Adenoma of left adrenal gland    Anemia    Anxiety    Aortic atherosclerosis    Bilateral carpal tunnel syndrome  CAD (coronary artery disease)    a.) HR CT chest 10/30/2021: 2 vessel CAD (LAD/RCA)   CAP (community acquired pneumonia)    COPD (chronic obstructive pulmonary disease) (HCC)    COPD exacerbation (HCC) 01/21/2018   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   Diverticulosis    Dyspnea    DUE TO HEAT WITH COPD   Epistaxis 01/09/2017   GERD (gastroesophageal reflux disease)    Herpes zoster 07/08/2015   Hypertension    Hypothyroidism    Iatrogenic hypotension 01/12/2017   Iliac artery stenosis, bilateral    Morbid obesity with BMI of 40.0-44.9, adult (HCC)    New onset of headaches    Osteopenia    PMR (polymyalgia rheumatica)    Pneumonia 02/13/2018   PONV (postoperative nausea and vomiting)    Pulmonary nodule, left    a.) CT chest 10/30/2021: 1.5 cm medial LUL nodule; b.) PET CT 11/21/2021: hypermetabolic LUL nodule (SUX max 9.3)   S/P carpal tunnel release 08/04/2020   Shingles    Stasis dermatitis    Strain of chest wall 01/20/2011   Tobacco abuse    Vitamin D  deficiency     Past Surgical History:  Procedure Laterality Date   ANTERIOR CERVICAL DECOMP/DISCECTOMY FUSION  06/22/2004   C4-C6   APPENDECTOMY     CARPAL TUNNEL RELEASE Left 08/31/2019   Procedure: CARPAL TUNNEL RELEASE;  Surgeon: Mardee Lynwood SQUIBB, MD;  Location: [facility];  Service: Orthopedics;  Laterality: Left;   CARPAL TUNNEL RELEASE Right 06/22/2020   Procedure: CARPAL TUNNEL RELEASE;  Surgeon: Mardee Lynwood SQUIBB, MD;  Location: [facility];  Service: Orthopedics;  Laterality: Right;   CHOLECYSTECTOMY     COLONOSCOPY     ENDOBRONCHIAL ULTRASOUND Left 01/17/2022   Procedure: ENDOBRONCHIAL ULTRASOUND;  Surgeon: Tamea Dedra CROME, MD;  Location: [facility];  Service: Pulmonary;  Laterality: Left;   ESOPHAGOGASTRODUODENOSCOPY     HEMORRHOID  SURGERY     TOTAL ABDOMINAL HYSTERECTOMY W/ BILATERAL SALPINGOOPHORECTOMY     TUBAL LIGATION      Social History:  Ambulatory   independently       reports that she quit smoking about 14 years ago. Her smoking use included cigarettes. She started smoking about 34 years ago. She has a 30 pack-year smoking history. She has never used smokeless tobacco. She reports that she does not drink alcohol and does not use drugs.    Family History:   Family History  Problem Relation Age of Onset   Heart attack Mother 64   Cancer Father        lung/chest wall    Prostate cancer Father    Diabetes Brother        siblings   Hypertension Sister        x 2   Heart disease Brother    Breast cancer Paternal Grandmother    Heart disease Sister    Diabetes Sister    Tuberculosis Sister    Cirrhosis Brother    ______________________________________________________________________________________________ Allergies: Allergies  Allergen Reactions   Epinephrine  Anaphylaxis and Shortness Of Breath   Food Rash    MANGO = cause rashes around the mouth LIPS GO NUMB   [employer] Rash    THIS IS MANGO LIPS GO NUMB   Other Rash    MANGO = cause rashes around the mouth MANGO = cause rashes around the mouth LIPS GO NUMB   Prednisone  Anaphylaxis, Hives, Shortness Of Breath, Itching and Swelling    The face swells HAS TAKEN CORTISONE  SHOTS WITHOUT ISSUE BEFORE   Ivp Dye [Iodinated Contrast Media] Hives   Lactose Intolerance (Gi) Diarrhea   Pneumococcal 13-Val Conj Vacc Itching and Other (See Comments)    Arm temp elevated at site of injection, red, itching   Prevnar 13 [Pneumococcal 13-Val Conj Vacc] Other (See Comments)    Arm temp elevated at site of injection, red, itching     Prior to Admission medications   Medication Sig Start Date End Date Taking? Authorizing Provider  alendronate  (FOSAMAX ) 70 MG tablet Take 70 mg by mouth once a week. Take with a full glass of water on an empty  stomach.    [provider]  atorvastatin  (LIPITOR) 10 MG tablet TAKE 1 TABLET BY MOUTH EVERY DAY FOR CHOLESTEROL 08/16/23   Clark, Katherine K, NP  Calcium  Carb-Cholecalciferol (CALCIUM  + VITAMIN D3 PO) Take 1,200 mg by mouth daily.    [provider]  dapagliflozin propanediol  (FARXIGA ) 10 MG TABS tablet TAKE 1 TABLET BY MOUTH DAILY BEFORE BREAKFAST. 05/02/23   Donette Ellouise LABOR, FNP  famotidine  (PEPCID ) 20 MG tablet TAKE 1 TABLET BY MOUTH AFTER SUPPER 08/16/23   Wert, Michael B, MD  furosemide  (LASIX ) 40 MG tablet Take 1 tablet (40 mg total) by mouth daily. 09/08/22 10/22/23  Laurita Pillion, MD  Glycopyrrolate -Formoterol  (BEVESPI  AEROSPHERE) 9-4.8 MCG/ACT AERO Inhale 2 puffs into the lungs 2 (two) times daily. 10/08/22   Tamea Dedra CROME, MD  levothyroxine  (SYNTHROID ) 75 MCG tablet TAKE 1 TABLET BY MOUTH EVERY MORNING ON EMPTY STOMACH WITH WATER ONLY,NO FOOD OR OTHER MED X30MINUTES 10/20/23   Gretta Comer POUR, NP  losartan  (COZAAR ) 100 MG tablet TAKE 1 TABLET (100 MG TOTAL) BY MOUTH DAILY. PLEASE CALL [phone] TO SCHEDULE A YEARLY APPOINTMENT. THANK YOU. 11/15/22   End, Lonni, MD  Multiple Vitamins-Minerals (PRESERVISION AREDS 2 PO) Take 1 tablet by mouth in the morning and at bedtime.    [provider]  pantoprazole  (PROTONIX ) 40 MG tablet Take 1 tablet (40 mg total) by mouth daily. for heartburn. 08/08/22   Gretta Comer POUR, NP  pentoxifylline  (TRENTAL ) 400 MG CR tablet Take 1 tablet (400 mg total) by mouth in the morning and at bedtime. 10/14/23 04/11/24  Claudene Lehmann, MD  spironolactone  (ALDACTONE ) 25 MG tablet Take 0.5 tablets (12.5 mg total) by mouth daily. 10/09/22   Donette Ellouise LABOR, FNP  tacrolimus  (PROTOPIC ) 0.1 % ointment Apply to aa's lower legs BID PRN. 12/10/22   Claudene Lehmann, MD    ___________________________________________________________________________________________________ Physical Exam:    11/06/2023   10:15 PM 11/06/2023   10:00  PM 11/06/2023    9:45 PM  Vitals with BMI  Systolic 126 124 877  Diastolic 37 51 95  Pulse 93 92 89     1. General:  in No  Acute distress    Chronically ill   -appearing 2. Psychological: Alert and   Oriented 3. Head/ENT:  Dry Mucous Membranes                          Head Non traumatic, neck supple                          Poor Dentition 4. SKIN:  decreased Skin turgor,  Skin clean Dry and intact no rash    5. Heart: Regular rate and rhythm no*** Murmur, no Rub or gallop 6. Lungs: ***Clear to auscultation bilaterally, no wheezes or crackles   7.  Abdomen: Soft, ***non-tender, Non distended *** obese ***bowel sounds present 8. Lower extremities: no clubbing, cyanosis, no ***edema 9. Neurologically Grossly intact, moving all 4 extremities equally  10. MSK: Normal range of motion    Chart has been reviewed  ______________________________________________________________________________________________  Assessment/Plan 78 y.o. 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    Other plan as per orders.  DVT prophylaxis:  SCD     Code Status:    Code Status: Prior FULL CODE   as per patient   I had personally discussed CODE STATUS with patient  ACP   none   Family Communication:   Family not at  Bedside    Diet  Diet Orders (From admission, onward)     Start     Ordered   11/06/23 1655  Diet NPO time specified  Diet effective now        11/06/23 1655             Disposition Plan:       To home once workup is complete and patient is stable   Following barriers for discharge:                                                                                  Anemia   stable                             Pain controlled with PO medications                                                          Will need consultants to evaluate patient prior to discharge                        Consult Orders  (From admission, onward)           Start     Ordered   11/06/23 2240  Consult to hospitalist  Once       Provider:  (Not yet assigned)  Question Answer Comment  Place call to: [facility]   Reason for Consult Admit      11/06/23 2239                               Would benefit from PT/OT eval prior to DC  Ordered  Consults called:   Treatment Team:  Md, Trauma, MD  Admission status:  ED Disposition     ED Disposition  Admit   Condition  --   Comment  Hospital Area: [facility] [[id number]]  Level of Care: Progressive [102]  Admit to Progressive based on following criteria: MULTISYSTEM THREATS such as stable sepsis, metabolic/electrolyte imbalance with or without encephalopathy that is responding to early treatment.  May admit patient to [facility] or [facility] if equivalent level of care is available:: No  Diagnosis: Multiple rib fractures [[id number]]  Admitting Physician: Jeannene Tschetter [[id number]]  Attending Physician: Revere Maahs [[id number]]  Certification:: I certify this patient will need inpatient services for at least 2 midnights            inpatient     I Expect 2 midnight stay secondary to severity of patient's current illness need for inpatient interventions justified by the following: ***hemodynamic instability despite optimal treatment (tachycardia *hypotension * tachypnea *hypoxia, hypercapnia) *** Severe lab/radiological/exam abnormalities including:     There are no diagnoses linked to this encounter. and extensive comorbidities including: *substance abuse  *Chronic pain *DM2  * CHF * CAD  * COPD/asthma *Morbid Obesity * CKD *dementia *liver disease *history of stroke with residual deficits *  malignancy, * sickle cell disease  History of amputation Chronic anticoagulation  That are currently affecting medical management.   I expect  patient to be hospitalized for 2 midnights requiring inpatient medical care.  Patient is at high risk for adverse outcome (such as loss of life or disability) if not treated.  Indication for inpatient stay as follows:  Severe change from baseline regarding mental status Hemodynamic instability despite maximal medical therapy,  severe pain requiring acute inpatient management,  inability to maintain oral hydration   persistent chest pain despite medical management Need for operative/procedural  intervention New or worsening hypoxia ongoing suicidal ideations   Need for IV antibiotics, IV fluids,, IV pain medications, IV anticoagulation,  IV rate controling medications, IV antihypertensives need for biPAP Frequent labs    Level of care        progressive       Ardice Boyan 11/06/2023, 11:34 PM    [facility]     after 2 AM please [place] floor coverage   If 7AM-7PM, please contact the day team taking care of the patient using [email or web address]

## 2023-11-07 ENCOUNTER — Inpatient Hospital Stay (HOSPITAL_COMMUNITY)

## 2023-11-07 DIAGNOSIS — N1831 Chronic kidney disease, stage 3a: Secondary | ICD-10-CM | POA: Diagnosis present

## 2023-11-07 DIAGNOSIS — I5033 Acute on chronic diastolic (congestive) heart failure: Secondary | ICD-10-CM | POA: Diagnosis not present

## 2023-11-07 DIAGNOSIS — E669 Obesity, unspecified: Secondary | ICD-10-CM

## 2023-11-07 DIAGNOSIS — S42411A Displaced simple supracondylar fracture without intercondylar fracture of right humerus, initial encounter for closed fracture: Secondary | ICD-10-CM | POA: Diagnosis present

## 2023-11-07 DIAGNOSIS — S32599A Other specified fracture of unspecified pubis, initial encounter for closed fracture: Secondary | ICD-10-CM | POA: Diagnosis present

## 2023-11-07 DIAGNOSIS — J4489 Other specified chronic obstructive pulmonary disease: Secondary | ICD-10-CM | POA: Diagnosis not present

## 2023-11-07 DIAGNOSIS — N183 Chronic kidney disease, stage 3 unspecified: Secondary | ICD-10-CM | POA: Diagnosis present

## 2023-11-07 LAB — TYPE AND SCREEN
ABO/RH(D): A POS
Antibody Screen: NEGATIVE

## 2023-11-07 LAB — COMPREHENSIVE METABOLIC PANEL WITH GFR
ALT: 18 U/L (ref 0–44)
AST: 22 U/L (ref 15–41)
Albumin: 3.2 g/dL — ABNORMAL LOW (ref 3.5–5.0)
Alkaline Phosphatase: 57 U/L (ref 38–126)
Anion gap: 10 (ref 5–15)
BUN: 26 mg/dL — ABNORMAL HIGH (ref 8–23)
CO2: 24 mmol/L (ref 22–32)
Calcium: 8.5 mg/dL — ABNORMAL LOW (ref 8.9–10.3)
Chloride: 104 mmol/L (ref 98–111)
Creatinine, Ser: 1.22 mg/dL — ABNORMAL HIGH (ref 0.44–1.00)
GFR, Estimated: 45 mL/min — ABNORMAL LOW (ref >60)
Glucose, Bld: 130 mg/dL — ABNORMAL HIGH (ref 70–99)
Potassium: 5 mmol/L (ref 3.5–5.1)
Sodium: 138 mmol/L (ref 135–145)
Total Bilirubin: 0.8 mg/dL (ref 0.0–1.2)
Total Protein: 7 g/dL (ref 6.5–8.1)

## 2023-11-07 LAB — CBC
HCT: 34.3 % — ABNORMAL LOW (ref 36.0–46.0)
HCT: 32.9 % — ABNORMAL LOW (ref 36.0–46.0)
HCT: 33.2 % — ABNORMAL LOW (ref 36.0–46.0)
Hemoglobin: 10.6 g/dL — ABNORMAL LOW (ref 12.0–15.0)
Hemoglobin: 10.4 g/dL — ABNORMAL LOW (ref 12.0–15.0)
Hemoglobin: 10.3 g/dL — ABNORMAL LOW (ref 12.0–15.0)
MCH: 29.8 pg (ref 26.0–34.0)
MCH: 30.1 pg (ref 26.0–34.0)
MCH: 29.7 pg (ref 26.0–34.0)
MCHC: 30.9 g/dL (ref 30.0–36.0)
MCHC: 31.6 g/dL (ref 30.0–36.0)
MCHC: 31 g/dL (ref 30.0–36.0)
MCV: 96.3 fL (ref 80.0–100.0)
MCV: 95.1 fL (ref 80.0–100.0)
MCV: 95.7 fL (ref 80.0–100.0)
Platelets: 292 K/uL (ref 150–400)
Platelets: 285 K/uL (ref 150–400)
Platelets: 273 K/uL (ref 150–400)
RBC: 3.56 MIL/uL — ABNORMAL LOW (ref 3.87–5.11)
RBC: 3.46 MIL/uL — ABNORMAL LOW (ref 3.87–5.11)
RBC: 3.47 MIL/uL — ABNORMAL LOW (ref 3.87–5.11)
RDW: 13.6 % (ref 11.5–15.5)
RDW: 13.9 % (ref 11.5–15.5)
RDW: 13.7 % (ref 11.5–15.5)
WBC: 13.2 K/uL — ABNORMAL HIGH (ref 4.0–10.5)
WBC: 12.2 K/uL — ABNORMAL HIGH (ref 4.0–10.5)
WBC: 12.9 K/uL — ABNORMAL HIGH (ref 4.0–10.5)
nRBC: 0 % (ref 0.0–0.2)
nRBC: 0 % (ref 0.0–0.2)
nRBC: 0 % (ref 0.0–0.2)

## 2023-11-07 LAB — PHOSPHORUS: Phosphorus: 3.3 mg/dL (ref 2.5–4.6)

## 2023-11-07 LAB — MAGNESIUM: Magnesium: 2.2 mg/dL (ref 1.7–2.4)

## 2023-11-07 MED ORDER — MORPHINE SULFATE (PF) 4 MG/ML IV SOLN
4.0000 mg | INTRAVENOUS | Status: DC | PRN
  Administered 2023-11-07 – 2023-11-09 (×5): 4 mg via INTRAVENOUS
  Filled 2023-11-07 (×6): qty 1

## 2023-11-07 MED ORDER — SPIRONOLACTONE 12.5 MG HALF TABLET
12.5000 mg | ORAL_TABLET | Freq: Every day | ORAL | Status: DC
  Administered 2023-11-07 – 2023-11-22 (×15): 12.5 mg via ORAL
  Filled 2023-11-07 (×15): qty 1

## 2023-11-07 MED ORDER — METOCLOPRAMIDE HCL 5 MG/ML IJ SOLN
5.0000 mg | Freq: Four times a day (QID) | INTRAMUSCULAR | Status: DC | PRN
  Filled 2023-11-07: qty 2

## 2023-11-07 MED ORDER — FUROSEMIDE 40 MG PO TABS: 40.0000 mg | ORAL_TABLET | Freq: Every day | ORAL | Status: DC

## 2023-11-07 MED ORDER — PANTOPRAZOLE SODIUM 40 MG PO TBEC: 40.0000 mg | DELAYED_RELEASE_TABLET | Freq: Every day | ORAL | Status: DC

## 2023-11-07 MED ORDER — METHOCARBAMOL 500 MG PO TABS
500.0000 mg | ORAL_TABLET | Freq: Three times a day (TID) | ORAL | Status: DC
  Administered 2023-11-07 – 2023-11-22 (×40): 500 mg via ORAL
  Filled 2023-11-07 (×43): qty 1

## 2023-11-07 MED ORDER — LEVOTHYROXINE SODIUM 75 MCG PO TABS
75.0000 ug | ORAL_TABLET | Freq: Every day | ORAL | Status: DC
  Administered 2023-11-07 – 2023-11-22 (×16): 75 ug via ORAL
  Filled 2023-11-07 (×16): qty 1

## 2023-11-07 MED ORDER — ACETAMINOPHEN 650 MG RE SUPP
650.0000 mg | Freq: Four times a day (QID) | RECTAL | Status: DC | PRN
Start: 2023-11-07 — End: 2023-11-11

## 2023-11-07 MED ORDER — ONDANSETRON HCL 4 MG/2ML IJ SOLN
4.0000 mg | Freq: Four times a day (QID) | INTRAMUSCULAR | Status: DC | PRN
  Administered 2023-11-10 – 2023-11-11 (×2): 4 mg via INTRAVENOUS
  Filled 2023-11-07 (×2): qty 2

## 2023-11-07 MED ORDER — SODIUM CHLORIDE 0.9 % IV SOLN: INTRAVENOUS | Status: DC

## 2023-11-07 MED ORDER — ONDANSETRON HCL 4 MG PO TABS: 4.0000 mg | ORAL_TABLET | Freq: Four times a day (QID) | ORAL | Status: DC | PRN

## 2023-11-07 MED ORDER — ACETAMINOPHEN 325 MG PO TABS
650.0000 mg | ORAL_TABLET | Freq: Four times a day (QID) | ORAL | Status: DC | PRN
  Administered 2023-11-08 – 2023-11-10 (×3): 650 mg via ORAL
  Filled 2023-11-07 (×3): qty 2

## 2023-11-07 MED ORDER — FUROSEMIDE 40 MG PO TABS
40.0000 mg | ORAL_TABLET | Freq: Every day | ORAL | Status: DC
  Administered 2023-11-08: 40 mg via ORAL
  Filled 2023-11-07: qty 1

## 2023-11-07 MED ORDER — PANTOPRAZOLE SODIUM 40 MG IV SOLR
40.0000 mg | Freq: Every day | INTRAVENOUS | Status: DC
  Administered 2023-11-07 (×2): 40 mg via INTRAVENOUS
  Filled 2023-11-07 (×2): qty 10

## 2023-11-07 MED ORDER — FENTANYL CITRATE (PF) 50 MCG/ML IJ SOSY
12.5000 ug | PREFILLED_SYRINGE | INTRAMUSCULAR | Status: DC | PRN
  Administered 2023-11-07 (×4): 50 ug via INTRAVENOUS
  Filled 2023-11-07 (×4): qty 1

## 2023-11-07 MED ORDER — HYDROCODONE-ACETAMINOPHEN 5-325 MG PO TABS
1.0000 | ORAL_TABLET | ORAL | Status: DC | PRN
  Administered 2023-11-10: 1 via ORAL
  Filled 2023-11-07: qty 1

## 2023-11-07 MED ORDER — FUROSEMIDE 10 MG/ML IJ SOLN
40.0000 mg | Freq: Once | INTRAMUSCULAR | Status: AC
  Administered 2023-11-07: 40 mg via INTRAVENOUS
  Filled 2023-11-07: qty 4

## 2023-11-07 MED ORDER — HYDROMORPHONE HCL 1 MG/ML IJ SOLN: 0.2500 mg | INTRAMUSCULAR | Status: DC | PRN

## 2023-11-07 MED ORDER — ATORVASTATIN CALCIUM 10 MG PO TABS
10.0000 mg | ORAL_TABLET | Freq: Every day | ORAL | Status: DC
  Administered 2023-11-09 – 2023-11-22 (×13): 10 mg via ORAL
  Filled 2023-11-07 (×14): qty 1

## 2023-11-07 MED ORDER — ALBUTEROL SULFATE (2.5 MG/3ML) 0.083% IN NEBU: 2.5000 mg | INHALATION_SOLUTION | RESPIRATORY_TRACT | Status: DC | PRN

## 2023-11-07 NOTE — Assessment & Plan Note (Signed)
 Chronic avoid fluid overload continue to monitor fluid status Restart diuretics when able to tolerate

## 2023-11-07 NOTE — ED Notes (Signed)
 Attempted to place purewick to collect urine sample, pt daughter at bedside states pt just fell asleep and request waiting to place purewick at this time. Pt incontinent and unable to use bedpan.

## 2023-11-07 NOTE — Assessment & Plan Note (Signed)
 Likely contributing to chronic respiratory failure

## 2023-11-07 NOTE — TOC CAGE-AID Note (Signed)
 Transition of Care [facility]) - CAGE-AID Screening   Patient Details  Name: Teresa Jensen MRN: [id number] Date of Birth: 11-01-1945  [facility] CM/SW Contact:    Maliik Karner E Basma Buchner, LCSW Phone Number: 11/07/2023, 3:35 PM   Clinical Narrative: No SA noted.   CAGE-AID Screening:    Have You Ever Felt You Ought to Cut Down on Your Drinking or Drug Use?: No Have People Annoyed You By Critizing Your Drinking Or Drug Use?: No Have You Felt Bad Or Guilty About Your Drinking Or Drug Use?: No Have You Ever Had a Drink or Used Drugs First Thing In The Morning to Steady Your Nerves or to Get Rid of a Hangover?: No CAGE-AID Score: 0  Substance Abuse Education Offered: No

## 2023-11-07 NOTE — Assessment & Plan Note (Signed)
-

## 2023-11-07 NOTE — Assessment & Plan Note (Signed)
-  chronic avoid nephrotoxic medications such as NSAIDs, Vanco Zosyn combo,  avoid hypotension, continue to follow renal function

## 2023-11-07 NOTE — Assessment & Plan Note (Signed)
Chronic stable continue home medication

## 2023-11-07 NOTE — Assessment & Plan Note (Signed)
 Contributing to comorbidity and complicating medical management  Body mass index is 34.62 kg/m.  Nutritional follow up as an out pt would be recommended

## 2023-11-07 NOTE — Assessment & Plan Note (Signed)
 Appreciate orthopedics and trauma surgery consult Given hematoma will follow CBC Obtain type and screen Transfuse as needed with hemoglobin noted to drop Pain management

## 2023-11-07 NOTE — Consult Note (Cosign Needed Addendum)
 Reason for Consult:Polytrauma Referring Physician: Concepcion Jensen Time called: 0730 Time at bedside: 0908   Teresa Jensen is an 78 y.o. female.  HPI: Teresa Jensen went outside to speak to some landscapers and slipped and fell. She had immediate pain in multiple locations and could not get up. She was brought to the ED where x-rays showed a right humerus and pelvic fxs in addition to other injuries and orthopedic surgery was consulted. She lives at home and is RHD.  Past Medical History:  Diagnosis Date   (HFpEF) heart failure with preserved ejection fraction (HCC) 06/28/2011   a.) TTE 06/28/2011: EF 55-60%, mild AR, G1DD; b.) TTE 01/16/2013: EF 55-60%, mild MR; c.) TTE 02/16/2018: EF 60-65%, G1DD; d.) MPI 11/26/2018: EF 56%   Abnormal electrocardiogram 11/12/2018   Acute kidney failure 01/12/2017   Acute on chronic respiratory failure with hypoxia (HCC) 09/06/2022   Adenoma of left adrenal gland    Anemia    Anxiety    Aortic atherosclerosis    Bilateral carpal tunnel syndrome    CAD (coronary artery disease)    a.) HR CT chest 10/30/2021: 2 vessel CAD (LAD/RCA)   CAP (community acquired pneumonia)    COPD (chronic obstructive pulmonary disease) (HCC)    COPD exacerbation (HCC) 01/21/2018   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   Diverticulosis    Dyspnea    DUE TO HEAT WITH COPD   Epistaxis 01/09/2017   GERD (gastroesophageal reflux disease)    Herpes zoster 07/08/2015   Hypertension    Hypothyroidism    Iatrogenic hypotension 01/12/2017   Iliac artery stenosis, bilateral    Morbid obesity with BMI of 40.0-44.9, adult (HCC)    New onset of headaches    Osteopenia    PMR (polymyalgia rheumatica)    Pneumonia 02/13/2018   PONV (postoperative nausea and vomiting)    Pulmonary nodule, left    a.) CT chest 10/30/2021: 1.5 cm medial LUL nodule; b.) PET CT 11/21/2021: hypermetabolic LUL nodule (SUX max 9.3)   S/P carpal tunnel release 08/04/2020   Shingles    Stasis  dermatitis    Strain of chest wall 01/20/2011   Tobacco abuse    Vitamin D  deficiency     Past Surgical History:  Procedure Laterality Date   ANTERIOR CERVICAL DECOMP/DISCECTOMY FUSION  06/22/2004   C4-C6   APPENDECTOMY     CARPAL TUNNEL RELEASE Left 08/31/2019   Procedure: CARPAL TUNNEL RELEASE;  Surgeon: Mardee Lynwood SQUIBB, MD;  Location: [facility] ORS;  Service: Orthopedics;  Laterality: Left;   CARPAL TUNNEL RELEASE Right 06/22/2020   Procedure: CARPAL TUNNEL RELEASE;  Surgeon: Mardee Lynwood SQUIBB, MD;  Location: [facility] OR[facility];  Service: Orthopedics;  Laterality: Right;   CHOLECYSTECTOMY     COLONOSCOPY     ENDOBRONCHIAL ULTRASOUND Left 01/17/2022   Procedure: ENDOBRONCHIAL ULTRASOUND;  Surgeon: Tamea Dedra CROME, MD;  Location: [facility] OR[facility];  Service: Pulmonary;  Laterality: Left;   ESOPHAGOGASTRODUODENOSCOPY     HEMORRHOID SURGERY     TOTAL ABDOMINAL HYSTERECTOMY W/ BILATERAL SALPINGOOPHORECTOMY     TUBAL LIGATION      Family History  Problem Relation Age of Onset   Heart attack Mother 39   Cancer Father        lung/chest wall    Prostate cancer Father    Diabetes Brother        siblings   Hypertension Sister        x 2   Heart disease Brother    Breast cancer Paternal Grandmother  Heart disease Sister    Diabetes Sister    Tuberculosis Sister    Cirrhosis Brother     Social History:  reports that she quit smoking about 14 years ago. Her smoking use included cigarettes. She started smoking about 34 years ago. She has a 30 pack-year smoking history. She has never used smokeless tobacco. She reports that she does not drink alcohol and does not use drugs.  Allergies:  Allergies  Allergen Reactions   Epinephrine  Anaphylaxis and Shortness Of Breath   Food Rash    MANGO = cause rashes around the mouth LIPS GO NUMB   Mangifera Indica Rash    THIS IS MANGO LIPS GO NUMB   Prednisone  Anaphylaxis, Hives, Shortness Of Breath, Itching and Swelling    The face swells HAS TAKEN CORTISONE  SHOTS WITHOUT ISSUE BEFORE Cannot take oral tablets either   Ivp Dye [Iodinated Contrast Media] Hives   Lactose Intolerance (Gi) Diarrhea   Prevnar 13 [Pneumococcal 13-Val Conj Vacc] Other (See Comments)    Arm temp elevated at site of injection, red, itching    Medications: I have reviewed the patient's current medications.  Results for orders placed or performed during the hospital encounter of 11/06/23 (from the past 48 hours)  Comprehensive metabolic panel     Status: Abnormal   Collection Time: 11/06/23  6:00 PM  Result Value Ref Range   Sodium 139 135 - 145 mmol/L   Potassium 4.1 3.5 - 5.1 mmol/L   Chloride 102 98 - 111 mmol/L   CO2 24 22 - 32 mmol/L   Glucose, Bld 140 (H) 70 - 99 mg/dL    Comment: Glucose reference range applies only to samples taken after fasting for at least 8 hours.   BUN 30 (H) 8 - 23 mg/dL   Creatinine, Ser 8.84 (H) 0.44 - 1.00 mg/dL   Calcium  9.0 8.9 - 10.3 mg/dL   Total Protein 7.3 6.5 - 8.1 g/dL   Albumin 3.3 (L) 3.5 - 5.0 g/dL   AST 20 15 - 41 U/L   ALT 18 0 - 44 U/L   Alkaline Phosphatase 63 38 - 126 U/L   Total Bilirubin 0.5 0.0 - 1.2 mg/dL   GFR, Estimated 49 (L) >60 mL/min    Comment: (NOTE) Calculated using the CKD-EPI Creatinine Equation (2021)    Anion gap 13 5 - 15    Comment: Performed at [facility], [address], [place], [name] [address]  CBC WITH DIFFERENTIAL     Status: Abnormal   Collection Time: 11/06/23  6:00 PM  Result Value Ref Range   WBC 18.1 (H) 4.0 - 10.5 K/uL   RBC 3.84 (L) 3.87 - 5.11 MIL/uL   Hemoglobin 11.6 (L) 12.0 - 15.0 g/dL   HCT 63.2 63.9 - 53.9 %   MCV 95.6 80.0 - 100.0 fL   MCH 30.2 26.0 - 34.0 pg   MCHC 31.6 30.0 - 36.0 g/dL   RDW 86.5 88.4 - 84.4 %   Platelets 331 150 - 400 K/uL   nRBC 0.0 0.0 - 0.2 %   Neutrophils Relative % 86 %   Neutro Abs 15.5 (H) 1.7 - 7.7 K/uL   Lymphocytes Relative 6 %   Lymphs Abs 1.0 0.7 - 4.0 K/uL   Monocytes Relative 7 %   Monocytes Absolute 1.3 (H) 0.1 -  1.0 K/uL   Eosinophils Relative 0 %   Eosinophils Absolute 0.0 0.0 - 0.5 K/uL   Basophils Relative 0 %  Basophils Absolute 0.1 0.0 - 0.1 K/uL   Immature Granulocytes 1 %   Abs Immature Granulocytes 0.16 (H) 0.00 - 0.07 K/uL    Comment: Performed at [facility], [address], [place], [name] [address]  I-Stat Chem 8, ED     Status: Abnormal   Collection Time: 11/06/23  6:11 PM  Result Value Ref Range   Sodium 140 135 - 145 mmol/L   Potassium 4.3 3.5 - 5.1 mmol/L   Chloride 104 98 - 111 mmol/L   BUN 36 (H) 8 - 23 mg/dL   Creatinine, Ser 8.79 (H) 0.44 - 1.00 mg/dL   Glucose, Bld 861 (H) 70 - 99 mg/dL    Comment: Glucose reference range applies only to samples taken after fasting for at least 8 hours.   Calcium , Ion 1.15 1.15 - 1.40 mmol/L   TCO2 27 22 - 32 mmol/L   Hemoglobin 11.9 (L) 12.0 - 15.0 g/dL   HCT 64.9 (L) 63.9 - 53.9 %  CBC     Status: Abnormal   Collection Time: 11/06/23  9:10 PM  Result Value Ref Range   WBC 17.3 (H) 4.0 - 10.5 K/uL   RBC 3.69 (L) 3.87 - 5.11 MIL/uL   Hemoglobin 11.1 (L) 12.0 - 15.0 g/dL   HCT 64.9 (L) 63.9 - 53.9 %   MCV 94.9 80.0 - 100.0 fL   MCH 30.1 26.0 - 34.0 pg   MCHC 31.7 30.0 - 36.0 g/dL   RDW 86.5 88.4 - 84.4 %   Platelets 321 150 - 400 K/uL   nRBC 0.0 0.0 - 0.2 %    Comment: Performed at [facility], [address], [place], [name] [address]  I-stat chem 8, ED (not at [facility], [facility] or [facility])     Status: Abnormal   Collection Time: 11/06/23  9:22 PM  Result Value Ref Range   Sodium 140 135 - 145 mmol/L   Potassium 4.6 3.5 - 5.1 mmol/L   Chloride 106 98 - 111 mmol/L   BUN 30 (H) 8 - 23 mg/dL   Creatinine, Ser 8.89 (H) 0.44 - 1.00 mg/dL   Glucose, Bld 860 (H) 70 - 99 mg/dL    Comment: Glucose reference range applies only to samples taken after fasting for at least 8 hours.   Calcium , Ion 1.15 1.15 - 1.40 mmol/L   TCO2 23 22 - 32 mmol/L   Hemoglobin 11.9 (L) 12.0 - 15.0 g/dL   HCT 64.9 (L) 63.9 - 53.9 %  Type and screen   [facility]     Status: None   Collection Time: 11/07/23  1:09 AM  Result Value Ref Range   ABO/RH(D) A POS    Antibody Screen NEG    Sample Expiration      11/10/2023,2359 Performed at [facility], [address], [place], [name] [address]   CBC     Status: Abnormal   Collection Time: 11/07/23  2:20 AM  Result Value Ref Range   WBC 13.2 (H) 4.0 - 10.5 K/uL   RBC 3.56 (L) 3.87 - 5.11 MIL/uL   Hemoglobin 10.6 (L) 12.0 - 15.0 g/dL   HCT 65.6 (L) 63.9 - 53.9 %   MCV 96.3 80.0 - 100.0 fL   MCH 29.8 26.0 - 34.0 pg   MCHC 30.9 30.0 - 36.0 g/dL   RDW 86.3 88.4 - 84.4 %   Platelets 292 150 - 400 K/uL   nRBC 0.0 0.0 - 0.2 %  Comment: Performed at [facility], [address] [place], [name] [address]    DG Shoulder Right Result Date: 11/07/2023 CLINICAL DATA:  Fall with right shoulder pain. EXAM: RIGHT SHOULDER - 2+ VIEW COMPARISON:  Chest x-ray 11/06/2023 FINDINGS: Exam demonstrates degenerative changes of the [facility] joint and glenohumeral joints. There is a minimally displaced fracture of the humeral neck/head. No evidence of shoulder dislocation. Suggestion of right posterolateral seventh rib fracture. Diffuse decreased bone mineralization. Remainder of the exam is unchanged. IMPRESSION: 1. Minimally displaced fracture of the humeral neck/head. 2. Suggestion of right posterolateral seventh rib fracture. Electronically Signed   By: Toribio Agreste M.D.   On: 11/07/2023 07:53   CT HEAD WO CONTRAST Result Date: 11/06/2023 EXAM: CT HEAD WITHOUT CONTRAST 11/06/2023 07:27:00 PM TECHNIQUE: CT of the head was performed without the administration of intravenous contrast. Automated exposure control, iterative reconstruction, and/or weight based adjustment of the mA/kV was utilized to reduce the radiation dose to as low as reasonably achievable. COMPARISON: None available. CLINICAL HISTORY: Head trauma, moderate-severe FINDINGS: BRAIN AND VENTRICLES: No acute hemorrhage. No  evidence of acute infarct. No hydrocephalus. No extra-axial collection. No mass effect or midline shift. ORBITS: No acute abnormality. SINUSES: No acute abnormality. SOFT TISSUES AND SKULL: No acute soft tissue abnormality. No skull fracture. IMPRESSION: 1. No acute intracranial abnormality or skull fracture. Electronically signed by: Maude Stammer MD 11/06/2023 07:47 PM EST RP Workstation: [id number]   CT CERVICAL SPINE WO CONTRAST Result Date: 11/06/2023 EXAM: CT CERVICAL SPINE WITHOUT CONTRAST 11/06/2023 07:27:00 PM TECHNIQUE: CT of the cervical spine was performed without the administration of intravenous contrast. Multiplanar reformatted images are provided for review. Automated exposure control, iterative reconstruction, and/or weight based adjustment of the mA/kV was utilized to reduce the radiation dose to as low as reasonably achievable. COMPARISON: None available. CLINICAL HISTORY: Polytrauma, blunt FINDINGS: CERVICAL SPINE: BONES AND ALIGNMENT: Normal alignment of the cervical vertebral bodies. No acute cervical spine fracture is identified. Surgical changes related to prior C4-C5 and C5-C6 anterior interbody fusion. No complicating features are identified. The facets are normally aligned. No fractures. DEGENERATIVE CHANGES: The spinal canal is fairly generous. No significant canal stenosis. Small focal calcified disc protrusion noted at C3-C4 with mass effect on the central aspect of the thecal sac. There is also moderate left foraminal stenosis at this level. SOFT TISSUES: No abnormal prevertebral soft tissue swelling. No neck mass, adenopathy, or hematoma. The lung apices are grossly clear. Advanced emphysematous changes and pulmonary scarring noted. IMPRESSION: 1. No acute cervical spine fracture. 2. Small calcified disc protrusion at C3-4 with thecal sac mass effect and moderate left foraminal stenosis. 3. Postsurgical C4-5 and C5-6 anterior interbody fusion without complicating features.  Electronically signed by: Maude Stammer MD 11/06/2023 07:46 PM EST RP Workstation: [id number]   CT CHEST ABDOMEN PELVIS WO CONTRAST Result Date: 11/06/2023 EXAM: CT CHEST, ABDOMEN AND PELVIS WITHOUT CONTRAST 11/06/2023 07:27:00 PM TECHNIQUE: CT of the chest, abdomen and pelvis was performed without the administration of intravenous contrast. Multiplanar reformatted images are provided for review. Automated exposure control, iterative reconstruction, and/or weight based adjustment of the mA/kV was utilized to reduce the radiation dose to as low as reasonably achievable. COMPARISON: Prior chest CT 07/29/2023 and prior abdominal/pelvic CT scan 12/20/2022. CLINICAL HISTORY: FINDINGS: CHEST: MEDIASTINUM AND LYMPH NODES: The heart is normal in size. No pericardial effusion. Stable tortuosity and calcification of the thoracic aorta and branch vessels. Stable 3-vessel coronary artery calcifications. Stable scattered mediastinal and hilar lymph nodes but  no new or progressive findings. The central airways are clear. The esophagus is grossly normal. LUNGS AND PLEURA: Progressive medial left upper lobe soft tissue density most likely reflecting progressive radiation fibrosis could not totally exclude the possibility of recurrent tumor. Stable advanced emphysematous changes and pulmonary scarring. Stable 7 mm nodular lesion in the left lower lobe on image 57/06. Progressive nodularity along the nearby major fissure is indeterminate. No new pulmonary nodules. No acute pulmonary process. No pulmonary contusion, pneumothorax, or pleural effusion. ABDOMEN AND PELVIS: LIVER: No acute hepatic injury or perihepatic fluid collection. GALLBLADDER AND BILE DUCTS: The gallbladder is surgically absent. Common bile duct dilatation. SPLEEN: No acute splenic injury or perisplenic fluid collection. PANCREAS: No acute pancreatic injury or peripancreatic fluid collection. ADRENAL GLANDS: Stable bilateral adrenal gland nodules consistent  with benign adenomas. KIDNEYS, URETERS AND BLADDER: No renal lesions or hydronephrosis. No perinephric hematoma. No stones in the kidneys or ureters. No perinephric or periureteral stranding. The bladder is unremarkable. GI AND BOWEL: The stomach, duodenum, small bowel, and colon are grossly normal without oral contrast. No inflammatory changes, mass lesions, or obstructive findings. The terminal ileum is normal. Colonic diverticulosis again noted. REPRODUCTIVE ORGANS: The uterus and ovaries are surgically absent. PERITONEUM AND RETROPERITONEUM: No ascites. No free air. No mesenteric or retroperitoneal mass, adenopathy, or hematoma. VASCULATURE: Advanced atherosclerotic calcification involving the aorta and branch vessels but no aneurysm. ABDOMINAL AND PELVIS LYMPH NODES: No lymphadenopathy. BONES AND SOFT TISSUES: The bony thorax is intact. No acute sternal, rib, or thoracic vertebral body fractures. No chest wall contusion or breast mass. Superior and inferior pubic rami fractures on the right side. Both hips are normally located. No hip fractures. The pubic symphysis and SI joints are intact. The lumbar vertebral bodies are intact. Right-sided extraperitoneal pelvic hematoma surrounding the bladder, findings secondary to right-sided pubic rami fractures. IMPRESSION: 1. Progressive medial left upper lobe soft tissue density most consistent with progressive radiation fibrosis; recurrent tumor cannot be excluded. Recommend short-interval chest CT follow-up or PET/CT and oncologic evaluation as indicated. 2. Stable 7 mm left lower lobe pulmonary nodule. 3. Progressive indeterminate nodularity along the adjacent major fissure. Recommend short-interval chest CT follow-up to assess for stability. 4. Right-sided extraperitoneal pelvic hematoma surrounding the bladder, secondary to right-sided pubic rami fractures. Electronically signed by: Maude Stammer MD 11/06/2023 07:42 PM EST RP Workstation: [id number]   DG  Pelvis Portable Result Date: 11/06/2023 CLINICAL DATA:  Clemens, pain EXAM: PORTABLE PELVIS 1-2 VIEWS COMPARISON:  12/20/2022 FINDINGS: Supine frontal view of the pelvis was obtained, the patient is slightly rotated toward the left. No acute displaced fracture, subluxation, or dislocation. Joint spaces are relatively well preserved. Prominent atherosclerosis of the aorta and its branches. IMPRESSION: 1. No acute displaced fracture. Electronically Signed   By: Ozell Daring M.D.   On: 11/06/2023 17:22   DG Chest Port 1 View Result Date: 11/06/2023 CLINICAL DATA:  Clemens, right shoulder pain EXAM: PORTABLE CHEST 1 VIEW COMPARISON:  09/06/2022 FINDINGS: Frontal view of the chest was obtained, excluding the right costophrenic angle by collimation. The cardiac silhouette is unremarkable. Increased density in the left suprahilar region compatible with patient's known history of lung cancer, may reflect post therapeutic change given findings on recent CT. Chronic consolidation within the periphery of the superior segment of the left lower lobe again noted and stable. No new airspace disease, effusion, or pneumothorax. There is a displaced impacted fracture through the right humeral neck, incompletely characterized on this exam due to collimation. There are  acute displaced right lateral fifth through eighth rib fractures as well. Prior healed left rib fractures are incidentally noted. IMPRESSION: 1. Acute fractures involving the right humeral neck and right lateral fifth through eighth ribs. 2. Chronic left suprahilar density unchanged since recent CT, compatible with known history of lung cancer. This could reflect post therapeutic changes in this region and continued follow-up is recommended. 3. Chronic scarring within the left lower lobe. Electronically Signed   By: Ozell Daring M.D.   On: 11/06/2023 17:21    Review of Systems  HENT:  Negative for ear discharge, ear pain, hearing loss and tinnitus.   Eyes:   Negative for photophobia and pain.  Respiratory:  Negative for cough and shortness of breath.   Cardiovascular:  Positive for chest pain.  Gastrointestinal:  Negative for abdominal pain, nausea and vomiting.  Genitourinary:  Negative for dysuria, flank pain, frequency and urgency.  Musculoskeletal:  Positive for arthralgias (Right shoulder). Negative for back pain, myalgias and neck pain.  Neurological:  Negative for dizziness and headaches.  Hematological:  Does not bruise/bleed easily.  Psychiatric/Behavioral:  The patient is not nervous/anxious.    Blood pressure (!) 107/35, pulse 91, temperature 98.2 F (36.8 C), temperature source Oral, resp. rate 17, height 4' 9 (1.448 m), weight 72.6 kg, SpO2 93%. Physical Exam Constitutional:      General: She is not in acute distress.    Appearance: She is well-developed. She is not diaphoretic.  HENT:     Head: Normocephalic and atraumatic.  Eyes:     General: No scleral icterus.       Right eye: No discharge.        Left eye: No discharge.     Conjunctiva/sclera: Conjunctivae normal.  Cardiovascular:     Rate and Rhythm: Normal rate and regular rhythm.  Pulmonary:     Effort: Pulmonary effort is normal. No respiratory distress.  Musculoskeletal:     Cervical back: Normal range of motion.     Comments: Right shoulder, elbow, wrist, digits- no skin wounds, mod TTP, sling in place, no instability, no blocks to motion  Sens  Ax/R/M/U intact  Mot   Ax/ R/ PIN/ M/ AIN/ U intact  Rad 2+  Pelvis--no traumatic wounds or rash, no ecchymosis, stable to manual stress, nontender  Skin:    General: Skin is warm and dry.  Neurological:     Mental Status: She is alert.  Psychiatric:        Mood and Affect: Mood normal.        Behavior: Behavior normal.     Assessment/Plan: Right humerus fx -- Likely will benefit from operative management, timing TBD. For now sling and NWB. Pelvic fxs -- Plan non-operative management with WBAT  BLE.    Ozell DOROTHA Ned, PA-C Orthopedic Surgery [phone] 11/07/2023, 9:18 AM

## 2023-11-07 NOTE — Assessment & Plan Note (Signed)
-   Check TSH continue home medications Synthroid  at 75mcg po q day

## 2023-11-07 NOTE — Progress Notes (Signed)
 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 She sustained the following injuries:  Rib fractures - Xray showed acute fractures of the right lateral 5-8th rib. However, not viewed on CT. Today, patient has no chest wall tenderness and is able to take deep breaths without significant pain. Continue to monitor exam. IS, Pulm toilet, and multi-modal pain control recommended.   Right humerus fracture - Ortho has consulted. Planning for operative management with timing TBD. Sling and NWB has been recommended. Multi-modal pain control advised.  Right ramus of right pubis - Ortho has consulted. Planning for non-operative management with WBAT BLE. UA ordered due to pelvic fracture. If greater than 50 RBC/hpf, will plan for CT cysto. Multi-modal pain control advised.  --Diet changed to NPO except sips with meds, so that PO pain regimen can be optimized. Fentanyl  changed to low dose dilaudid for slightly longer acting IV regimen.    Tertiary Survey: Trauma scans reviewed: Yes Additional Imaging recommendations: No Labs reviewed: Yes Additional lab work recommendations: Yes: Urinalysis  Does the patient have any new complaints? No Cervical Collar Cleared: Yes DVT ppx ordered? No; Would recommend DVT prophylaxis with SQH [id number] (due to renal function) to start pending Ortho's surgical plan. Is patient age > 37 (78 y.o.) : Yes  Physical Exam: Constitutional: Pleasant female resting in bed in NAD.  Eyes: Head normocephalic. Hematoma noted of right forehead. EOMI. Ears and  nose without any masses or lesions. Mouth is pink and moist.  Neck: Trachea midline. Lungs: Normal respiratory effort on O2; CTAB. CV: Normal HR during encounter. Palpable radial and pedal pulses bilaterally. GI: Abd soft, NT/ND; no palpable hepatosplenomegaly MSK: Able to move left upper extremity and bilateral lower extremities; R arm in sling. No edema or cyanosis noted. Psychiatric: Appropriate affect; alert and oriented x3   Incidental Findings:  Progressive indeterminate nodularity along the adjacent major fissure. Short-interval chest CT follow up recommended. To assess stability.

## 2023-11-07 NOTE — Assessment & Plan Note (Signed)
 Monitor blood pressure and restart as able Given currently soft blood pressures and hematoma will hold off on spironolactone  and losartan  for tonight

## 2023-11-07 NOTE — Progress Notes (Signed)
 Progress Note   Patient: Teresa Jensen FMW:[id number] DOB: 12-22-1945 DOA: 11/06/2023     1 DOS: the patient was seen and examined on 11/07/2023   Brief hospital course: MITTIE KNITTEL is a 78 y.o. 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  Patient sustained right pubic ramus fracture with hematoma in pelvis, right humeral head fracture.  Trauma surgery cleared her, orthopedic consulted, admitted to TRH service.  Assessment and Plan: Status post fall, multiple injuries Right humerus fracture Closed pubic rami fracture Cleared by trauma service. Orthopedic surgery planning on ORIF right humerus, no scheduled procedure at this time.  Patient is started on diet. Continue pain control with opiate medications.  Acute on chronic hypoxic respiratory failure - In the setting of trauma, pain.  She does have CHF. Continue supplemental oxygen, wean to home 1-2L O2. CT chest ruled out rib fractures. Continue bronchodilators, Lasix  therapy  Acute on chronic heart failure with preserved ejection fraction- Patient does have basilar rhonchi, increased supplemental oxygen requirement. Will give a dose of IV lasix  40mg  one time and start oral Lasix  40 mg daily from tomorrow. Follow daily renal function, electrolytes, weight, strict input and output.  COPD History of pulmonary fibrosis No exacerbation Continue bronchodilators as needed.  Essential hypertension: BP lower side.  Caution with antihypertensive regimen.  GERD: Continue Protonix .  Hypothyroidism: Continue home  Synthroid .  CKD stage III A- Kidney function stable. Avoid nephrotoxic drugs, hypertension, contrast. Monitor daily renal function.  Obesity class I- BMI 34.62. Diet, exercise and weight reduction advised.      Out of bed to chair. Incentive spirometry. Nursing supportive care. Fall, aspiration precautions. Diet:  Diet Orders (From admission, onward)     Start     Ordered   11/07/23 1021  Diet Heart Room service appropriate? Yes; Fluid consistency: Thin  Diet effective now       Question Answer Comment  Room service appropriate? Yes   Fluid consistency: Thin      11/07/23 1021           DVT prophylaxis: SCDs Start: 11/07/23 0943  Level of care: Progressive   Code Status: Full Code  Subjective: Patient is seen and examined today morning. She is lying in bed. Has severe pain, asked for pain meds, diet. Son over phone with whom I discussed care plan.   Physical Exam: Vitals:   11/07/23 0830 11/07/23 0906 11/07/23 0944 11/07/23 0948  BP: (!) 107/35   (!) 103/50  Pulse: 91   93  Resp: 17   18  Temp:  98.2 F (36.8 C) 98.5 F (36.9 C) 98.5 F (36.9 C)  TempSrc:  Oral Oral Oral  SpO2: 93%   96%  Weight:      Height:        General - Elderly Caucasian female, distress due to pain HEENT - PERRLA, EOMI, atraumatic head, non tender sinuses. Lung - Clear, diffuse rales, rhonchi, no wheezes. Heart - S1, S2 heard, no murmurs, rubs, trace pedal edema. Abdomen - Soft, non tender, bowel sounds good Neuro - Alert, awake and oriented x 3, non focal exam. Skin - Warm and dry.  Right arm sling noted.  Data Reviewed:      Latest Ref Rng & Units 11/07/2023   11:03 AM 11/07/2023    2:20 AM 11/06/2023    9:22 PM  CBC  WBC 4.0 - 10.5 K/uL 12.2  13.2    Hemoglobin 12.0 - 15.0 g/dL 89.5  89.3  88.0   Hematocrit 36.0 - 46.0 % 32.9  34.3  35.0   Platelets 150 - 400 K/uL 285  292        Latest Ref Rng & Units 11/07/2023   11:03 AM 11/06/2023    9:22 PM 11/06/2023    6:11  PM  BMP  Glucose 70 - 99 mg/dL 869  860  861   BUN 8 - 23 mg/dL 26  30  36   Creatinine 0.44 - 1.00 mg/dL 8.77  8.89  8.79   Sodium 135 - 145 mmol/L 138  140  140   Potassium 3.5 - 5.1 mmol/L 5.0  4.6  4.3   Chloride 98 - 111 mmol/L 104  106  104   CO2 22 - 32 mmol/L 24     Calcium  8.9 - 10.3 mg/dL 8.5      DG Shoulder Right Result Date: 11/07/2023 CLINICAL DATA:  Fall with right shoulder pain. EXAM: RIGHT SHOULDER - 2+ VIEW COMPARISON:  Chest x-ray 11/06/2023 FINDINGS: Exam demonstrates degenerative changes of the [facility] joint and glenohumeral joints. There is a minimally displaced fracture of the humeral neck/head. No evidence of shoulder dislocation. Suggestion of right posterolateral seventh rib fracture. Diffuse decreased bone mineralization. Remainder of the exam is unchanged. IMPRESSION: 1. Minimally displaced fracture of the humeral neck/head. 2. Suggestion of right posterolateral seventh rib fracture. Electronically Signed   By: Toribio Agreste M.D.   On: 11/07/2023 07:53   CT HEAD WO CONTRAST Result Date: 11/06/2023 EXAM: CT HEAD WITHOUT CONTRAST 11/06/2023 07:27:00 PM TECHNIQUE: CT of the head was performed without the administration of intravenous contrast. Automated exposure control, iterative reconstruction, and/or weight based adjustment of the mA/kV was utilized to reduce the radiation dose to as low as reasonably achievable. COMPARISON: None available. CLINICAL HISTORY: Head trauma, moderate-severe FINDINGS: BRAIN AND VENTRICLES: No acute hemorrhage. No evidence of acute infarct. No hydrocephalus. No extra-axial collection. No mass effect or midline shift. ORBITS: No acute abnormality. SINUSES: No acute abnormality. SOFT TISSUES AND SKULL: No acute soft tissue abnormality. No skull fracture. IMPRESSION: 1. No acute intracranial abnormality or skull fracture. Electronically signed by: Maude Stammer MD 11/06/2023 07:47 PM EST RP Workstation: [id number]   CT CERVICAL SPINE WO  CONTRAST Result Date: 11/06/2023 EXAM: CT CERVICAL SPINE WITHOUT CONTRAST 11/06/2023 07:27:00 PM TECHNIQUE: CT of the cervical spine was performed without the administration of intravenous contrast. Multiplanar reformatted images are provided for review. Automated exposure control, iterative reconstruction, and/or weight based adjustment of the mA/kV was utilized to reduce the radiation dose to as low as reasonably achievable. COMPARISON: None available. CLINICAL HISTORY: Polytrauma, blunt FINDINGS: CERVICAL SPINE: BONES AND ALIGNMENT: Normal alignment of the cervical vertebral bodies. No acute cervical spine fracture is identified. Surgical changes related to prior C4-C5 and C5-C6 anterior interbody fusion. No complicating features are identified. The facets are normally aligned. No fractures. DEGENERATIVE CHANGES: The spinal canal is fairly generous. No significant canal stenosis. Small focal calcified disc protrusion noted at C3-C4 with mass effect on the central aspect of the thecal sac. There is also moderate left foraminal stenosis at this level. SOFT TISSUES: No abnormal prevertebral soft tissue swelling. No  neck mass, adenopathy, or hematoma. The lung apices are grossly clear. Advanced emphysematous changes and pulmonary scarring noted. IMPRESSION: 1. No acute cervical spine fracture. 2. Small calcified disc protrusion at C3-4 with thecal sac mass effect and moderate left foraminal stenosis. 3. Postsurgical C4-5 and C5-6 anterior interbody fusion without complicating features. Electronically signed by: Maude Stammer MD 11/06/2023 07:46 PM EST RP Workstation: [id number]   CT CHEST ABDOMEN PELVIS WO CONTRAST Result Date: 11/06/2023 EXAM: CT CHEST, ABDOMEN AND PELVIS WITHOUT CONTRAST 11/06/2023 07:27:00 PM TECHNIQUE: CT of the chest, abdomen and pelvis was performed without the administration of intravenous contrast. Multiplanar reformatted images are provided for review. Automated exposure control,  iterative reconstruction, and/or weight based adjustment of the mA/kV was utilized to reduce the radiation dose to as low as reasonably achievable. COMPARISON: Prior chest CT 07/29/2023 and prior abdominal/pelvic CT scan 12/20/2022. CLINICAL HISTORY: FINDINGS: CHEST: MEDIASTINUM AND LYMPH NODES: The heart is normal in size. No pericardial effusion. Stable tortuosity and calcification of the thoracic aorta and branch vessels. Stable 3-vessel coronary artery calcifications. Stable scattered mediastinal and hilar lymph nodes but no new or progressive findings. The central airways are clear. The esophagus is grossly normal. LUNGS AND PLEURA: Progressive medial left upper lobe soft tissue density most likely reflecting progressive radiation fibrosis could not totally exclude the possibility of recurrent tumor. Stable advanced emphysematous changes and pulmonary scarring. Stable 7 mm nodular lesion in the left lower lobe on image 57/06. Progressive nodularity along the nearby major fissure is indeterminate. No new pulmonary nodules. No acute pulmonary process. No pulmonary contusion, pneumothorax, or pleural effusion. ABDOMEN AND PELVIS: LIVER: No acute hepatic injury or perihepatic fluid collection. GALLBLADDER AND BILE DUCTS: The gallbladder is surgically absent. Common bile duct dilatation. SPLEEN: No acute splenic injury or perisplenic fluid collection. PANCREAS: No acute pancreatic injury or peripancreatic fluid collection. ADRENAL GLANDS: Stable bilateral adrenal gland nodules consistent with benign adenomas. KIDNEYS, URETERS AND BLADDER: No renal lesions or hydronephrosis. No perinephric hematoma. No stones in the kidneys or ureters. No perinephric or periureteral stranding. The bladder is unremarkable. GI AND BOWEL: The stomach, duodenum, small bowel, and colon are grossly normal without oral contrast. No inflammatory changes, mass lesions, or obstructive findings. The terminal ileum is normal. Colonic  diverticulosis again noted. REPRODUCTIVE ORGANS: The uterus and ovaries are surgically absent. PERITONEUM AND RETROPERITONEUM: No ascites. No free air. No mesenteric or retroperitoneal mass, adenopathy, or hematoma. VASCULATURE: Advanced atherosclerotic calcification involving the aorta and branch vessels but no aneurysm. ABDOMINAL AND PELVIS LYMPH NODES: No lymphadenopathy. BONES AND SOFT TISSUES: The bony thorax is intact. No acute sternal, rib, or thoracic vertebral body fractures. No chest wall contusion or breast mass. Superior and inferior pubic rami fractures on the right side. Both hips are normally located. No hip fractures. The pubic symphysis and SI joints are intact. The lumbar vertebral bodies are intact. Right-sided extraperitoneal pelvic hematoma surrounding the bladder, findings secondary to right-sided pubic rami fractures. IMPRESSION: 1. Progressive medial left upper lobe soft tissue density most consistent with progressive radiation fibrosis; recurrent tumor cannot be excluded. Recommend short-interval chest CT follow-up or PET/CT and oncologic evaluation as indicated. 2. Stable 7 mm left lower lobe pulmonary nodule. 3. Progressive indeterminate nodularity along the adjacent major fissure. Recommend short-interval chest CT follow-up to assess for stability. 4. Right-sided extraperitoneal pelvic hematoma surrounding the bladder, secondary to right-sided pubic rami fractures. Electronically signed by: Maude Stammer MD 11/06/2023 07:42 PM EST RP Workstation: [id number]   DG Pelvis Portable Result Date:  11/06/2023 CLINICAL DATA:  Clemens, pain EXAM: PORTABLE PELVIS 1-2 VIEWS COMPARISON:  12/20/2022 FINDINGS: Supine frontal view of the pelvis was obtained, the patient is slightly rotated toward the left. No acute displaced fracture, subluxation, or dislocation. Joint spaces are relatively well preserved. Prominent atherosclerosis of the aorta and its branches. IMPRESSION: 1. No acute displaced  fracture. Electronically Signed   By: Ozell Daring M.D.   On: 11/06/2023 17:22   DG Chest Port 1 View Result Date: 11/06/2023 CLINICAL DATA:  Clemens, right shoulder pain EXAM: PORTABLE CHEST 1 VIEW COMPARISON:  09/06/2022 FINDINGS: Frontal view of the chest was obtained, excluding the right costophrenic angle by collimation. The cardiac silhouette is unremarkable. Increased density in the left suprahilar region compatible with patient's known history of lung cancer, may reflect post therapeutic change given findings on recent CT. Chronic consolidation within the periphery of the superior segment of the left lower lobe again noted and stable. No new airspace disease, effusion, or pneumothorax. There is a displaced impacted fracture through the right humeral neck, incompletely characterized on this exam due to collimation. There are acute displaced right lateral fifth through eighth rib fractures as well. Prior healed left rib fractures are incidentally noted. IMPRESSION: 1. Acute fractures involving the right humeral neck and right lateral fifth through eighth ribs. 2. Chronic left suprahilar density unchanged since recent CT, compatible with known history of lung cancer. This could reflect post therapeutic changes in this region and continued follow-up is recommended. 3. Chronic scarring within the left lower lobe. Electronically Signed   By: Ozell Daring M.D.   On: 11/06/2023 17:21    Family Communication: Discussed with patient, son over phone. They understand and agree. All questions answered.  Disposition: Status is: Inpatient Remains inpatient appropriate because: pain control, ortho intervention, PT/ OT  Planned Discharge Destination: Home with Home Health     Time spent: 45 minutes  Author: Concepcion Riser, MD 11/07/2023 2:56 PM Secure chat 7am to 7pm For on call review [email or web address].

## 2023-11-07 NOTE — Assessment & Plan Note (Signed)
 As per orthopedics Npo post midnight in case needs operative intervention Pain managment

## 2023-11-07 NOTE — Assessment & Plan Note (Signed)
 In the setting of trauma pain and pain medication administration Order incentive spirometry Continue oxygen as needed Initially there was report of possible rib fractures per trauma surgery there is no evidence of rib  fractures on CT

## 2023-11-07 NOTE — ED Notes (Signed)
 CCMD called, pt placed on monitor.

## 2023-11-07 NOTE — TOC Initial Note (Signed)
 Transition of Care [facility]) - Initial/Assessment Note    Patient Details  Name: Teresa Jensen MRN: [id number] Date of Birth: May 12, 1945  [facility] CM/SW Contact:    Nola Devere Hands, RN Phone Number: 11/07/2023, 1:46 PM  Clinical Narrative:                 Patient is a 78 yr old female, s/p fall when she went outside to speak to landscaper. Patient has right rib fractures 5-8, right humerus fracture and right pubic Rami fracture. Waiting for surgery date for humerus repair, the Pubic Rami fracture will be medically managed. Pattient is from home with family and was independent prior to fall, ICM will continue to follow for needs.         Patient Goals and CMS Choice            Expected Discharge Plan and Services                                              Prior Living Arrangements/Services                       Activities of Daily Living      Permission Sought/Granted                  Emotional Assessment              Admission diagnosis:  Multiple rib fractures [S22.49XA] Closed fracture of ramus of right pubis, initial encounter (HCC) [S32.591A] Closed fracture of multiple ribs of both sides, initial encounter [S22.43XA] Other closed displaced fracture of proximal end of right humerus, initial encounter [S42.291A] Ground-level fall [W18.30XA] Patient Active Problem List   Diagnosis Date Noted   Closed fracture of multiple pubic rami (HCC) 11/07/2023   CKD (chronic kidney disease), stage III (HCC) 11/07/2023   Right supracondylar humerus fracture 11/07/2023   Multiple rib fractures 11/06/2023   Myalgia 08/13/2023   Skin breakdown 06/21/2023   Candidal intertrigo 03/21/2023   Urinary urgency 11/21/2022   Acute on chronic diastolic (congestive) heart failure (HCC) 09/06/2022   Acute on chronic respiratory failure with hypoxia (HCC) 09/06/2022   Allergic reaction to contrast dye 07/17/2022   Intermittent  claudication 07/04/2022   Lipodermatosclerosis of both lower extremities 02/22/2022   Solitary pulmonary nodule 11/27/2021   Chronic right shoulder pain 08/04/2021   Lower extremity pain 08/04/2021   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   Morbid obesity (HCC) 05/01/2021   Foot callus 09/01/2020   Rash and nonspecific skin eruption 07/07/2020   History of colonic polyps 11/01/2019   Bilateral carpal tunnel syndrome 08/16/2019   Chronic heart failure with preserved ejection fraction (HFpEF) (HCC) 11/12/2018   DOE (dyspnea on exertion) 11/12/2018   Osteoporosis 12/16/2017   Low vitamin B12 level 01/13/2017   Hypokalemia 01/12/2017   Essential hypertension 01/08/2017   Closed fracture of metatarsal bone 09/28/2016   Prediabetes 10/17/2015   Pain of right lower extremity 05/20/2015   Medicare annual wellness visit, subsequent 10/12/2014   Occipital neuralgia 10/12/2014   Vitamin D  deficiency 10/12/2014   Erythema of lower extremity 09/08/2013   COPD GOLD 2  06/05/2010   Hypothyroidism 09/29/2009   Situational anxiety 10/01/2008   COPD 10/01/2008   GERD 10/01/2008   Osteoarthritis 10/01/2008   PCP:  Gretta Comer POUR, NP Pharmacy:  [id number] [place], [place] - [address] [name] [address] Phone: [phone] Fax: [phone]  [facility] - [place], [name] - [address] [name] [address] Phone: [phone] Fax: [phone]  [facility] - [place], [place] - [address] [place] [address] Phone: [phone] Fax: [phone]     Social Drivers of Health (SDOH) Social History: SDOH Screenings   Food Insecurity: Patient Declined (05/01/2023)  Housing: Patient Declined (05/01/2023)  Transportation Needs: Patient Declined (05/01/2023)  Utilities: Not At Risk (05/08/2023)  Alcohol Screen: Low Risk  (05/07/2022)  Depression (PHQ2-9): Low Risk   (08/13/2023)  Financial Resource Strain: Patient Declined (05/01/2023)  Physical Activity: Sufficiently Active (05/01/2023)  Social Connections: Unknown (05/01/2023)  Stress: No Stress Concern Present (05/01/2023)  Tobacco Use: Medium Risk (11/06/2023)  Health Literacy: Adequate Health Literacy (05/08/2023)   SDOH Interventions:     Readmission Risk Interventions     No data to display

## 2023-11-08 DIAGNOSIS — I5033 Acute on chronic diastolic (congestive) heart failure: Secondary | ICD-10-CM | POA: Diagnosis not present

## 2023-11-08 DIAGNOSIS — S42411A Displaced simple supracondylar fracture without intercondylar fracture of right humerus, initial encounter for closed fracture: Secondary | ICD-10-CM | POA: Diagnosis not present

## 2023-11-08 DIAGNOSIS — J4489 Other specified chronic obstructive pulmonary disease: Secondary | ICD-10-CM | POA: Diagnosis not present

## 2023-11-08 DIAGNOSIS — N1831 Chronic kidney disease, stage 3a: Secondary | ICD-10-CM | POA: Diagnosis not present

## 2023-11-08 LAB — URINALYSIS, ROUTINE W REFLEX MICROSCOPIC
Bilirubin Urine: NEGATIVE
Glucose, UA: NEGATIVE mg/dL
Ketones, ur: NEGATIVE mg/dL
Nitrite: NEGATIVE
Protein, ur: 30 mg/dL — AB
Specific Gravity, Urine: 1.017 (ref 1.005–1.030)
WBC, UA: >50 WBC/hpf (ref 0–5)
pH: 5 (ref 5.0–8.0)

## 2023-11-08 LAB — CBC
HCT: 31.9 % — ABNORMAL LOW (ref 36.0–46.0)
Hemoglobin: 10.1 g/dL — ABNORMAL LOW (ref 12.0–15.0)
MCH: 29.8 pg (ref 26.0–34.0)
MCHC: 31.7 g/dL (ref 30.0–36.0)
MCV: 94.1 fL (ref 80.0–100.0)
Platelets: 251 K/uL (ref 150–400)
RBC: 3.39 MIL/uL — ABNORMAL LOW (ref 3.87–5.11)
RDW: 13.8 % (ref 11.5–15.5)
WBC: 13 K/uL — ABNORMAL HIGH (ref 4.0–10.5)
nRBC: 0 % (ref 0.0–0.2)

## 2023-11-08 LAB — BASIC METABOLIC PANEL WITH GFR
Anion gap: 12 (ref 5–15)
BUN: 31 mg/dL — ABNORMAL HIGH (ref 8–23)
CO2: 23 mmol/L (ref 22–32)
Calcium: 7.9 mg/dL — ABNORMAL LOW (ref 8.9–10.3)
Chloride: 97 mmol/L — ABNORMAL LOW (ref 98–111)
Creatinine, Ser: 1.45 mg/dL — ABNORMAL HIGH (ref 0.44–1.00)
GFR, Estimated: 37 mL/min — ABNORMAL LOW (ref >60)
Glucose, Bld: 117 mg/dL — ABNORMAL HIGH (ref 70–99)
Potassium: 4.5 mmol/L (ref 3.5–5.1)
Sodium: 132 mmol/L — ABNORMAL LOW (ref 135–145)

## 2023-11-08 MED ORDER — FUROSEMIDE 20 MG PO TABS
20.0000 mg | ORAL_TABLET | Freq: Every day | ORAL | Status: DC
  Administered 2023-11-09 – 2023-11-11 (×3): 20 mg via ORAL
  Filled 2023-11-08 (×3): qty 1

## 2023-11-08 MED ORDER — PANTOPRAZOLE SODIUM 40 MG PO TBEC
40.0000 mg | DELAYED_RELEASE_TABLET | Freq: Every day | ORAL | Status: DC
  Administered 2023-11-08 – 2023-11-21 (×14): 40 mg via ORAL
  Filled 2023-11-08 (×14): qty 1

## 2023-11-08 NOTE — Consult Note (Signed)
 ORTHOPAEDIC CONSULTATION  REQUESTING PHYSICIAN: Darci Pore, MD  Chief Complaint: right shoulder pain  HPI: Teresa Jensen is a 78 y.o. 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 She had a fall on 11/5. She landed on her right side. She had immediate shoulder pain. She denies any prior shoulder issues. She is right hand dominant. She normally ambulates without any assistance. She had trouble sleeping last night due to pain. She has been in her sling. She cannot tolerate any movement of the shoulder.  Past Medical History:  Diagnosis Date   (HFpEF) heart failure with preserved ejection fraction (HCC) 06/28/2011   a.) TTE 06/28/2011: EF 55-60%, mild AR, G1DD; b.) TTE 01/16/2013: EF 55-60%, mild MR; c.) TTE 02/16/2018: EF 60-65%, G1DD; d.) MPI 11/26/2018: EF 56%   Abnormal electrocardiogram 11/12/2018   Acute kidney failure 01/12/2017   Acute on chronic respiratory failure with hypoxia (HCC) 09/06/2022   Adenoma of left adrenal gland    Anemia    Anxiety    Aortic atherosclerosis    Bilateral carpal tunnel syndrome    CAD (coronary artery disease)    a.) HR CT chest 10/30/2021: 2 vessel CAD (LAD/RCA)   CAP (community acquired pneumonia)    COPD (chronic obstructive pulmonary disease) (HCC)    COPD exacerbation (HCC) 01/21/2018   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   Diverticulosis    Dyspnea    DUE TO HEAT WITH COPD   Epistaxis 01/09/2017   GERD (gastroesophageal reflux disease)    Herpes zoster 07/08/2015   Hypertension    Hypothyroidism     Iatrogenic hypotension 01/12/2017   Iliac artery stenosis, bilateral    Morbid obesity with BMI of 40.0-44.9, adult (HCC)    New onset of headaches    Osteopenia    PMR (polymyalgia rheumatica)    Pneumonia 02/13/2018   PONV (postoperative nausea and vomiting)    Pulmonary nodule, left    a.) CT chest 10/30/2021: 1.5 cm medial LUL nodule; b.) PET CT 11/21/2021: hypermetabolic LUL nodule (SUX max 9.3)   S/P carpal tunnel release 08/04/2020   Shingles    Stasis dermatitis    Strain of chest wall 01/20/2011   Tobacco abuse    Vitamin D  deficiency    Past Surgical History:  Procedure Laterality Date   ANTERIOR CERVICAL DECOMP/DISCECTOMY FUSION  06/22/2004   C4-C6   APPENDECTOMY     CARPAL TUNNEL RELEASE Left 08/31/2019   Procedure: CARPAL TUNNEL RELEASE;  Surgeon: Mardee Lynwood SQUIBB, MD;  Location: [facility];  Service: Orthopedics;  Laterality: Left;   CARPAL TUNNEL RELEASE Right 06/22/2020   Procedure: CARPAL TUNNEL RELEASE;  Surgeon: Mardee Lynwood SQUIBB, MD;  Location: [facility];  Service: Orthopedics;  Laterality: Right;   CHOLECYSTECTOMY     COLONOSCOPY     ENDOBRONCHIAL ULTRASOUND Left 01/17/2022   Procedure: ENDOBRONCHIAL ULTRASOUND;  Surgeon: Tamea Dedra CROME, MD;  Location: [facility];  Service: Pulmonary;  Laterality: Left;   ESOPHAGOGASTRODUODENOSCOPY     HEMORRHOID SURGERY     TOTAL ABDOMINAL HYSTERECTOMY W/ BILATERAL SALPINGOOPHORECTOMY     TUBAL LIGATION     Social History  Socioeconomic History   Marital status: Married    Spouse name: [name]   Number of children: 1   Years of education: Not on file   Highest education level: 12th grade  Occupational History   Occupation: Catering Manager as a Paramedic: RETIRED    Comment: retired  Tobacco Use   Smoking status: Former    Current packs/day: 0.00    Average packs/day: 1.5 packs/day for 20.0 years (30.0 ttl pk-yrs)    Types: Cigarettes    Start date: 02/11/1989    Quit date: 02/11/2009    Years since  quitting: 14.7   Smokeless tobacco: Never  Vaping Use   Vaping status: Never Used  Substance and Sexual Activity   Alcohol use: No    Alcohol/week: 0.0 standard drinks of alcohol   Drug use: No   Sexual activity: Not Currently  Other Topics Concern   Not on file  Social History Narrative   Lives in [place] with her husband.  Takes care of her two grandchildren, ages 23 and 2, everyday in [place].  Has one son who is 32.   Desires CPR   Would not want prolonged life support if futile   Social Drivers of Health   Financial Resource Strain: Patient Declined (05/01/2023)   Overall Financial Resource Strain (CARDIA)    Difficulty of Paying Living Expenses: Patient declined  Food Insecurity: Patient Declined (11/08/2023)   Hunger Vital Sign    Worried About Running Out of Food in the Last Year: Patient declined    Ran Out of Food in the Last Year: Patient declined  Transportation Needs: Patient Declined (11/08/2023)   PRAPARE - Administrator, Civil Service (Medical): Patient declined    Lack of Transportation (Non-Medical): Patient declined  Physical Activity: Sufficiently Active (05/01/2023)   Exercise Vital Sign    Days of Exercise per Week: 3 days    Minutes of Exercise per Session: 60 min  Stress: No Stress Concern Present (05/01/2023)   [employer] of Occupational Health - Occupational Stress Questionnaire    Feeling of Stress : Only a little  Social Connections: Unknown (11/08/2023)   Social Connection and Isolation Panel    Frequency of Communication with Friends and Family: Patient declined    Frequency of Social Gatherings with Friends and Family: Not on file    Attends Religious Services: Patient declined    Active Member of Clubs or Organizations: Patient declined    Attends Banker Meetings: Patient declined    Marital Status: Patient declined   Family History  Problem Relation Age of Onset   Heart attack Mother 52   Cancer Father         lung/chest wall    Prostate cancer Father    Diabetes Brother        siblings   Hypertension Sister        x 2   Heart disease Brother    Breast cancer Paternal Grandmother    Heart disease Sister    Diabetes Sister    Tuberculosis Sister    Cirrhosis Brother    Allergies  Allergen Reactions   Epinephrine  Anaphylaxis and Shortness Of Breath   Food Rash    MANGO = cause rashes around the mouth LIPS GO NUMB   [employer] Rash    THIS IS MANGO LIPS GO NUMB   Prednisone  Anaphylaxis, Hives, Shortness Of Breath, Itching and Swelling    The face swells HAS TAKEN  CORTISONE SHOTS WITHOUT ISSUE BEFORE Cannot take oral tablets either   Ivp Dye [Iodinated Contrast Media] Hives   Lactose Intolerance (Gi) Diarrhea   Prevnar 13 [Pneumococcal 13-Val Conj Vacc] Other (See Comments)    Arm temp elevated at site of injection, red, itching   Prior to Admission medications   Medication Sig Start Date End Date Taking? Authorizing Provider  alendronate  (FOSAMAX ) 70 MG tablet Take 70 mg by mouth every Sunday. Take with a full glass of water on an empty stomach.   Yes [provider]  Calcium  Carb-Cholecalciferol (CALCIUM  + VITAMIN D3 PO) Take 1,200 mg by mouth daily.   Yes [provider]  dapagliflozin propanediol  (FARXIGA ) 10 MG TABS tablet TAKE 1 TABLET BY MOUTH DAILY BEFORE BREAKFAST. 05/02/23  Yes Hackney, Tina A, FNP  famotidine  (PEPCID ) 20 MG tablet TAKE 1 TABLET BY MOUTH AFTER SUPPER Patient taking differently: Take 20 mg by mouth daily as needed for heartburn. 08/16/23  Yes Darlean Ozell NOVAK, MD  furosemide  (LASIX ) 40 MG tablet Take 1 tablet (40 mg total) by mouth daily. Patient taking differently: Take 40 mg by mouth daily as needed for edema. 09/08/22 11/06/24 Yes Laurita Pillion, MD  Glycopyrrolate -Formoterol  (BEVESPI  AEROSPHERE) 9-4.8 MCG/ACT AERO Inhale 2 puffs into the lungs 2 (two) times daily. 10/08/22  Yes Tamea Dedra CROME, MD  levothyroxine  (SYNTHROID ) 75 MCG  tablet TAKE 1 TABLET BY MOUTH EVERY MORNING ON EMPTY STOMACH WITH WATER ONLY,NO FOOD OR OTHER MED X30MINUTES 10/20/23  Yes Gretta Comer POUR, NP  losartan  (COZAAR ) 100 MG tablet TAKE 1 TABLET (100 MG TOTAL) BY MOUTH DAILY. PLEASE CALL [phone] TO SCHEDULE A YEARLY APPOINTMENT. THANK YOU. 11/15/22  Yes End, Lonni, MD  Multiple Vitamins-Minerals (PRESERVISION AREDS 2 PO) Take 1 tablet by mouth in the morning and at bedtime.   Yes [provider]  pantoprazole  (PROTONIX ) 40 MG tablet Take 1 tablet (40 mg total) by mouth daily. for heartburn. Patient taking differently: Take 40 mg by mouth daily as needed (for heartburn). for heartburn. 08/08/22  Yes Gretta Comer POUR, NP  pentoxifylline  (TRENTAL ) 400 MG CR tablet Take 1 tablet (400 mg total) by mouth in the morning and at bedtime. 10/14/23 04/11/24 Yes Claudene Lehmann, MD  spironolactone  (ALDACTONE ) 25 MG tablet Take 0.5 tablets (12.5 mg total) by mouth daily. 10/09/22  Yes Hackney, Ellouise A, FNP  tacrolimus  (PROTOPIC ) 0.1 % ointment Apply to aa's lower legs BID PRN. Patient taking differently: Apply 1 Application topically 2 (two) times daily as needed (for lower legs). 12/10/22  Yes Claudene Lehmann, MD  atorvastatin  (LIPITOR) 10 MG tablet TAKE 1 TABLET BY MOUTH EVERY DAY FOR CHOLESTEROL Patient not taking: Reported on 11/07/2023 08/16/23   Gretta Comer POUR, NP   DG Shoulder Right Result Date: 11/07/2023 CLINICAL DATA:  Fall with right shoulder pain. EXAM: RIGHT SHOULDER - 2+ VIEW COMPARISON:  Chest x-ray 11/06/2023 FINDINGS: Exam demonstrates degenerative changes of the [facility] joint and glenohumeral joints. There is a minimally displaced fracture of the humeral neck/head. No evidence of shoulder dislocation. Suggestion of right posterolateral seventh rib fracture. Diffuse decreased bone mineralization. Remainder of the exam is unchanged. IMPRESSION: 1. Minimally displaced fracture of the humeral neck/head. 2. Suggestion of right  posterolateral seventh rib fracture. Electronically Signed   By: Toribio Agreste M.D.   On: 11/07/2023 07:53   CT HEAD WO CONTRAST Result Date: 11/06/2023 EXAM: CT HEAD WITHOUT CONTRAST 11/06/2023 07:27:00 PM TECHNIQUE: CT of the head was performed without the administration of intravenous contrast. Automated  exposure control, iterative reconstruction, and/or weight based adjustment of the mA/kV was utilized to reduce the radiation dose to as low as reasonably achievable. COMPARISON: None available. CLINICAL HISTORY: Head trauma, moderate-severe FINDINGS: BRAIN AND VENTRICLES: No acute hemorrhage. No evidence of acute infarct. No hydrocephalus. No extra-axial collection. No mass effect or midline shift. ORBITS: No acute abnormality. SINUSES: No acute abnormality. SOFT TISSUES AND SKULL: No acute soft tissue abnormality. No skull fracture. IMPRESSION: 1. No acute intracranial abnormality or skull fracture. Electronically signed by: Maude Stammer MD 11/06/2023 07:47 PM EST RP Workstation: [id number]   CT CERVICAL SPINE WO CONTRAST Result Date: 11/06/2023 EXAM: CT CERVICAL SPINE WITHOUT CONTRAST 11/06/2023 07:27:00 PM TECHNIQUE: CT of the cervical spine was performed without the administration of intravenous contrast. Multiplanar reformatted images are provided for review. Automated exposure control, iterative reconstruction, and/or weight based adjustment of the mA/kV was utilized to reduce the radiation dose to as low as reasonably achievable. COMPARISON: None available. CLINICAL HISTORY: Polytrauma, blunt FINDINGS: CERVICAL SPINE: BONES AND ALIGNMENT: Normal alignment of the cervical vertebral bodies. No acute cervical spine fracture is identified. Surgical changes related to prior C4-C5 and C5-C6 anterior interbody fusion. No complicating features are identified. The facets are normally aligned. No fractures. DEGENERATIVE CHANGES: The spinal canal is fairly generous. No significant canal stenosis. Small focal  calcified disc protrusion noted at C3-C4 with mass effect on the central aspect of the thecal sac. There is also moderate left foraminal stenosis at this level. SOFT TISSUES: No abnormal prevertebral soft tissue swelling. No neck mass, adenopathy, or hematoma. The lung apices are grossly clear. Advanced emphysematous changes and pulmonary scarring noted. IMPRESSION: 1. No acute cervical spine fracture. 2. Small calcified disc protrusion at C3-4 with thecal sac mass effect and moderate left foraminal stenosis. 3. Postsurgical C4-5 and C5-6 anterior interbody fusion without complicating features. Electronically signed by: Maude Stammer MD 11/06/2023 07:46 PM EST RP Workstation: [id number]   CT CHEST ABDOMEN PELVIS WO CONTRAST Result Date: 11/06/2023 EXAM: CT CHEST, ABDOMEN AND PELVIS WITHOUT CONTRAST 11/06/2023 07:27:00 PM TECHNIQUE: CT of the chest, abdomen and pelvis was performed without the administration of intravenous contrast. Multiplanar reformatted images are provided for review. Automated exposure control, iterative reconstruction, and/or weight based adjustment of the mA/kV was utilized to reduce the radiation dose to as low as reasonably achievable. COMPARISON: Prior chest CT 07/29/2023 and prior abdominal/pelvic CT scan 12/20/2022. CLINICAL HISTORY: FINDINGS: CHEST: MEDIASTINUM AND LYMPH NODES: The heart is normal in size. No pericardial effusion. Stable tortuosity and calcification of the thoracic aorta and branch vessels. Stable 3-vessel coronary artery calcifications. Stable scattered mediastinal and hilar lymph nodes but no new or progressive findings. The central airways are clear. The esophagus is grossly normal. LUNGS AND PLEURA: Progressive medial left upper lobe soft tissue density most likely reflecting progressive radiation fibrosis could not totally exclude the possibility of recurrent tumor. Stable advanced emphysematous changes and pulmonary scarring. Stable 7 mm nodular lesion in the  left lower lobe on image 57/06. Progressive nodularity along the nearby major fissure is indeterminate. No new pulmonary nodules. No acute pulmonary process. No pulmonary contusion, pneumothorax, or pleural effusion. ABDOMEN AND PELVIS: LIVER: No acute hepatic injury or perihepatic fluid collection. GALLBLADDER AND BILE DUCTS: The gallbladder is surgically absent. Common bile duct dilatation. SPLEEN: No acute splenic injury or perisplenic fluid collection. PANCREAS: No acute pancreatic injury or peripancreatic fluid collection. ADRENAL GLANDS: Stable bilateral adrenal gland nodules consistent with benign adenomas. KIDNEYS, URETERS AND BLADDER: No renal lesions or hydronephrosis.  No perinephric hematoma. No stones in the kidneys or ureters. No perinephric or periureteral stranding. The bladder is unremarkable. GI AND BOWEL: The stomach, duodenum, small bowel, and colon are grossly normal without oral contrast. No inflammatory changes, mass lesions, or obstructive findings. The terminal ileum is normal. Colonic diverticulosis again noted. REPRODUCTIVE ORGANS: The uterus and ovaries are surgically absent. PERITONEUM AND RETROPERITONEUM: No ascites. No free air. No mesenteric or retroperitoneal mass, adenopathy, or hematoma. VASCULATURE: Advanced atherosclerotic calcification involving the aorta and branch vessels but no aneurysm. ABDOMINAL AND PELVIS LYMPH NODES: No lymphadenopathy. BONES AND SOFT TISSUES: The bony thorax is intact. No acute sternal, rib, or thoracic vertebral body fractures. No chest wall contusion or breast mass. Superior and inferior pubic rami fractures on the right side. Both hips are normally located. No hip fractures. The pubic symphysis and SI joints are intact. The lumbar vertebral bodies are intact. Right-sided extraperitoneal pelvic hematoma surrounding the bladder, findings secondary to right-sided pubic rami fractures. IMPRESSION: 1. Progressive medial left upper lobe soft tissue density  most consistent with progressive radiation fibrosis; recurrent tumor cannot be excluded. Recommend short-interval chest CT follow-up or PET/CT and oncologic evaluation as indicated. 2. Stable 7 mm left lower lobe pulmonary nodule. 3. Progressive indeterminate nodularity along the adjacent major fissure. Recommend short-interval chest CT follow-up to assess for stability. 4. Right-sided extraperitoneal pelvic hematoma surrounding the bladder, secondary to right-sided pubic rami fractures. Electronically signed by: Maude Stammer MD 11/06/2023 07:42 PM EST RP Workstation: [id number]   DG Pelvis Portable Result Date: 11/06/2023 CLINICAL DATA:  Clemens, pain EXAM: PORTABLE PELVIS 1-2 VIEWS COMPARISON:  12/20/2022 FINDINGS: Supine frontal view of the pelvis was obtained, the patient is slightly rotated toward the left. No acute displaced fracture, subluxation, or dislocation. Joint spaces are relatively well preserved. Prominent atherosclerosis of the aorta and its branches. IMPRESSION: 1. No acute displaced fracture. Electronically Signed   By: Ozell Daring M.D.   On: 11/06/2023 17:22   DG Chest Port 1 View Result Date: 11/06/2023 CLINICAL DATA:  Clemens, right shoulder pain EXAM: PORTABLE CHEST 1 VIEW COMPARISON:  09/06/2022 FINDINGS: Frontal view of the chest was obtained, excluding the right costophrenic angle by collimation. The cardiac silhouette is unremarkable. Increased density in the left suprahilar region compatible with patient's known history of lung cancer, may reflect post therapeutic change given findings on recent CT. Chronic consolidation within the periphery of the superior segment of the left lower lobe again noted and stable. No new airspace disease, effusion, or pneumothorax. There is a displaced impacted fracture through the right humeral neck, incompletely characterized on this exam due to collimation. There are acute displaced right lateral fifth through eighth rib fractures as well. Prior  healed left rib fractures are incidentally noted. IMPRESSION: 1. Acute fractures involving the right humeral neck and right lateral fifth through eighth ribs. 2. Chronic left suprahilar density unchanged since recent CT, compatible with known history of lung cancer. This could reflect post therapeutic changes in this region and continued follow-up is recommended. 3. Chronic scarring within the left lower lobe. Electronically Signed   By: Ozell Daring M.D.   On: 11/06/2023 17:21   Family History Reviewed and non-contributory, no pertinent history of problems with bleeding or anesthesia      Review of Systems 14 system ROS conducted and negative except for that noted in HPI   OBJECTIVE  Vitals:Patient Vitals for the past 8 hrs:  BP Temp Temp src Pulse Resp SpO2  11/08/23 0900 (!) 122/46 --  Oral 91 20 93 %  11/08/23 0504 -- 98.6 F (37 C) Oral -- -- --   General: Alert, no acute distress Cardiovascular: Warm extremities noted Respiratory: No cyanosis, no use of accessory musculature GI: No organomegaly, abdomen is soft and non-tender Skin: No lesions in the area of chief complaint other than those listed below in MSK exam.  Neurologic: Sensation intact distally save for the below mentioned MSK exam Psychiatric: Patient is competent for consent with normal mood and affect Lymphatic: No swelling obvious and reported other than the area involved in the exam below  Extremities  RUE: TTP proximal humerus. ROM not tested in setting of known fracture. She endorses axillary nerve sensation which she states is preserved and symmetric. Was able to appreciate slight deltoid motor function. Distal motor and sensory function intact. Warm well perfused hand.     Test Results Imaging Xrays of the right shoulder demonstrate a displaced proximal humerus fracture  CT pelvis demonstrates right sided pubic rami fractures  Labs cbc Recent Labs    11/07/23 1317 11/08/23 0255  WBC 12.9* 13.0*  HGB  10.3* 10.1*  HCT 33.2* 31.9*  PLT 273 251    Labs inflam No results for input(s): CRP in the last 72 hours.  Invalid input(s): ESR  Labs coag No results for input(s): INR, PTT in the last 72 hours.  Invalid input(s): PT  Recent Labs    11/07/23 1103 11/08/23 0255  NA 138 132*  K 5.0 4.5  CL 104 97*  CO2 24 23  GLUCOSE 130* 117*  BUN 26* 31*  CREATININE 1.22* 1.45*  CALCIUM  8.5* 7.9*     ASSESSMENT AND PLAN: 78 y.o. female with the following:   Right proximal humerus fracture:  Orthopedics recommends admission to a medical service and we will provide consultation and follow along.  The risks benefits and alternatives were discussed with the patient including but not limited to the risks of nonoperative treatment, versus surgical intervention including infection, bleeding, nerve injury, periprosthetic fracture, the need for revision surgery blood clots, cardiopulmonary complications, morbidity, mortality, among others, and they were willing to proceed.    We additionally specifically discussed risks of axillary nerve injury, infection, periprosthetic fracture, continued pain and longevity of implants prior to beginning procedure.    This was discussed with the patient's son Teresa Jensen who is also in agreement with the treatment plan.   - Weight Bearing Status/Activity: NWB in sling - NPO midnight Sunday - Right reverse total shoulder arthroplasty for fracture with Dr. Cristy   Right pubic rami fracture:  -WBAT  - non-operative management   Aleck Stalling, PA-C 11/08/23

## 2023-11-08 NOTE — Progress Notes (Signed)
 Progress Note   Patient: Teresa Jensen FMW:[id number] DOB: [birth year] DOA: 11/06/2023     2 DOS: the patient was seen and examined on 11/08/2023   Brief hospital course: Teresa Jensen is a 78 y.o. 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  Patient sustained right pubic ramus fracture with hematoma in pelvis, right humeral head fracture.  Trauma surgery cleared her, orthopedic consulted, admitted to TRH service.  Assessment and Plan: Status post fall, multiple injuries Right humerus fracture Closed pubic rami fracture Cleared by trauma service. Orthopedic surgery planning on ORIF right humerus on Monday. Continue pain control with opiate medications.  Acute on chronic hypoxic respiratory failure - In the setting of trauma, pain.  She does have CHF. Continue supplemental oxygen, wean to home 1-2L O2. CT chest ruled out rib fractures. Continue bronchodilators, Lasix  home dose.  Acute on chronic heart failure with preserved ejection fraction- Patient does have basilar rhonchi, increased supplemental oxygen requirement. S/p IV lasix . decreased oral Lasix  to 20 mg daily. Follow daily renal function, electrolytes, weight, strict input and output.  COPD History of pulmonary fibrosis No exacerbation. Home o2 1-2L. Continue bronchodilators as needed.  Essential hypertension: BP lower side.  Caution with antihypertensive regimen.  GERD: Continue Protonix .  Hypothyroidism: Continue home Synthroid .  CKD stage III A- Kidney function stable. Avoid nephrotoxic drugs,  hypertension, contrast. Monitor daily renal function.  Obesity class I- BMI 34.62. Diet, exercise and weight reduction advised.      Out of bed to chair. Incentive spirometry. Nursing supportive care. Fall, aspiration precautions. Diet:  Diet Orders (From admission, onward)     Start     Ordered   11/07/23 1021  Diet Heart Room service appropriate? Yes; Fluid consistency: Thin  Diet effective now       Question Answer Comment  Room service appropriate? Yes   Fluid consistency: Thin      11/07/23 1021           DVT prophylaxis: SCDs Start: 11/07/23 0943  Level of care: Progressive   Code Status: Full Code  Subjective: Patient is seen and examined today morning. She is lying in bed.  Has pain, eating fair. Advised to work with PT. No family at bedside.  Physical Exam: Vitals:   11/08/23 0504 11/08/23 0900 11/08/23 1124 11/08/23 1128  BP:  (!) 122/46 (!) 108/44 (!) 124/45  Pulse:  91 (!) 101 90  Resp:  20 20 (!) 22  Temp: 98.6 F (37 C) 98.8 F (37.1 C) 98.1 F (36.7 C)   TempSrc: Oral Oral Oral   SpO2:  93% 95% 98%  Weight:      Height:        General - Elderly Caucasian female, distress due to pain HEENT - PERRLA, EOMI, atraumatic head, non tender sinuses. Lung - Clear, diffuse rales, rhonchi, no wheezes. Heart - S1, S2 heard, no murmurs, rubs, trace pedal edema. Abdomen - Soft, non tender, bowel sounds good Neuro - Alert, awake and oriented x 3, non focal exam. Skin - Warm and dry. Right arm sling noted.  Data Reviewed:      Latest Ref Rng &  Units 11/08/2023    2:55 AM 11/07/2023    1:17 PM 11/07/2023   11:03 AM  CBC  WBC 4.0 - 10.5 K/uL 13.0  12.9  12.2   Hemoglobin 12.0 - 15.0 g/dL 89.8  89.6  89.5   Hematocrit 36.0 - 46.0 % 31.9  33.2  32.9   Platelets 150 - 400 K/uL 251  273  285       Latest Ref Rng & Units 11/08/2023    2:55 AM 11/07/2023   11:03 AM 11/06/2023    9:22 PM  BMP  Glucose 70 - 99 mg/dL 882  869  860   BUN 8 - 23 mg/dL 31   26  30    Creatinine 0.44 - 1.00 mg/dL 8.54  8.77  8.89   Sodium 135 - 145 mmol/L 132  138  140   Potassium 3.5 - 5.1 mmol/L 4.5  5.0  4.6   Chloride 98 - 111 mmol/L 97  104  106   CO2 22 - 32 mmol/L 23  24    Calcium  8.9 - 10.3 mg/dL 7.9  8.5     DG Shoulder Right Result Date: 11/07/2023 CLINICAL DATA:  Fall with right shoulder pain. EXAM: RIGHT SHOULDER - 2+ VIEW COMPARISON:  Chest x-ray 11/06/2023 FINDINGS: Exam demonstrates degenerative changes of the [facility] joint and glenohumeral joints. There is a minimally displaced fracture of the humeral neck/head. No evidence of shoulder dislocation. Suggestion of right posterolateral seventh rib fracture. Diffuse decreased bone mineralization. Remainder of the exam is unchanged. IMPRESSION: 1. Minimally displaced fracture of the humeral neck/head. 2. Suggestion of right posterolateral seventh rib fracture. Electronically Signed   By: Toribio Agreste M.D.   On: 11/07/2023 07:53   CT HEAD WO CONTRAST Result Date: 11/06/2023 EXAM: CT HEAD WITHOUT CONTRAST 11/06/2023 07:27:00 PM TECHNIQUE: CT of the head was performed without the administration of intravenous contrast. Automated exposure control, iterative reconstruction, and/or weight based adjustment of the mA/kV was utilized to reduce the radiation dose to as low as reasonably achievable. COMPARISON: None available. CLINICAL HISTORY: Head trauma, moderate-severe FINDINGS: BRAIN AND VENTRICLES: No acute hemorrhage. No evidence of acute infarct. No hydrocephalus. No extra-axial collection. No mass effect or midline shift. ORBITS: No acute abnormality. SINUSES: No acute abnormality. SOFT TISSUES AND SKULL: No acute soft tissue abnormality. No skull fracture. IMPRESSION: 1. No acute intracranial abnormality or skull fracture. Electronically signed by: Maude Stammer MD 11/06/2023 07:47 PM EST RP Workstation: [id number]   CT CERVICAL SPINE WO CONTRAST Result Date: 11/06/2023 EXAM: CT CERVICAL SPINE WITHOUT CONTRAST  11/06/2023 07:27:00 PM TECHNIQUE: CT of the cervical spine was performed without the administration of intravenous contrast. Multiplanar reformatted images are provided for review. Automated exposure control, iterative reconstruction, and/or weight based adjustment of the mA/kV was utilized to reduce the radiation dose to as low as reasonably achievable. COMPARISON: None available. CLINICAL HISTORY: Polytrauma, blunt FINDINGS: CERVICAL SPINE: BONES AND ALIGNMENT: Normal alignment of the cervical vertebral bodies. No acute cervical spine fracture is identified. Surgical changes related to prior C4-C5 and C5-C6 anterior interbody fusion. No complicating features are identified. The facets are normally aligned. No fractures. DEGENERATIVE CHANGES: The spinal canal is fairly generous. No significant canal stenosis. Small focal calcified disc protrusion noted at C3-C4 with mass effect on the central aspect of the thecal sac. There is also moderate left foraminal stenosis at this level. SOFT TISSUES: No abnormal prevertebral soft tissue swelling. No neck mass, adenopathy, or hematoma. The lung apices are grossly clear. Advanced  emphysematous changes and pulmonary scarring noted. IMPRESSION: 1. No acute cervical spine fracture. 2. Small calcified disc protrusion at C3-4 with thecal sac mass effect and moderate left foraminal stenosis. 3. Postsurgical C4-5 and C5-6 anterior interbody fusion without complicating features. Electronically signed by: Maude Stammer MD 11/06/2023 07:46 PM EST RP Workstation: [id number]   CT CHEST ABDOMEN PELVIS WO CONTRAST Result Date: 11/06/2023 EXAM: CT CHEST, ABDOMEN AND PELVIS WITHOUT CONTRAST 11/06/2023 07:27:00 PM TECHNIQUE: CT of the chest, abdomen and pelvis was performed without the administration of intravenous contrast. Multiplanar reformatted images are provided for review. Automated exposure control, iterative reconstruction, and/or weight based adjustment of the mA/kV was  utilized to reduce the radiation dose to as low as reasonably achievable. COMPARISON: Prior chest CT 07/29/2023 and prior abdominal/pelvic CT scan 12/20/2022. CLINICAL HISTORY: FINDINGS: CHEST: MEDIASTINUM AND LYMPH NODES: The heart is normal in size. No pericardial effusion. Stable tortuosity and calcification of the thoracic aorta and branch vessels. Stable 3-vessel coronary artery calcifications. Stable scattered mediastinal and hilar lymph nodes but no new or progressive findings. The central airways are clear. The esophagus is grossly normal. LUNGS AND PLEURA: Progressive medial left upper lobe soft tissue density most likely reflecting progressive radiation fibrosis could not totally exclude the possibility of recurrent tumor. Stable advanced emphysematous changes and pulmonary scarring. Stable 7 mm nodular lesion in the left lower lobe on image 57/06. Progressive nodularity along the nearby major fissure is indeterminate. No new pulmonary nodules. No acute pulmonary process. No pulmonary contusion, pneumothorax, or pleural effusion. ABDOMEN AND PELVIS: LIVER: No acute hepatic injury or perihepatic fluid collection. GALLBLADDER AND BILE DUCTS: The gallbladder is surgically absent. Common bile duct dilatation. SPLEEN: No acute splenic injury or perisplenic fluid collection. PANCREAS: No acute pancreatic injury or peripancreatic fluid collection. ADRENAL GLANDS: Stable bilateral adrenal gland nodules consistent with benign adenomas. KIDNEYS, URETERS AND BLADDER: No renal lesions or hydronephrosis. No perinephric hematoma. No stones in the kidneys or ureters. No perinephric or periureteral stranding. The bladder is unremarkable. GI AND BOWEL: The stomach, duodenum, small bowel, and colon are grossly normal without oral contrast. No inflammatory changes, mass lesions, or obstructive findings. The terminal ileum is normal. Colonic diverticulosis again noted. REPRODUCTIVE ORGANS: The uterus and ovaries are  surgically absent. PERITONEUM AND RETROPERITONEUM: No ascites. No free air. No mesenteric or retroperitoneal mass, adenopathy, or hematoma. VASCULATURE: Advanced atherosclerotic calcification involving the aorta and branch vessels but no aneurysm. ABDOMINAL AND PELVIS LYMPH NODES: No lymphadenopathy. BONES AND SOFT TISSUES: The bony thorax is intact. No acute sternal, rib, or thoracic vertebral body fractures. No chest wall contusion or breast mass. Superior and inferior pubic rami fractures on the right side. Both hips are normally located. No hip fractures. The pubic symphysis and SI joints are intact. The lumbar vertebral bodies are intact. Right-sided extraperitoneal pelvic hematoma surrounding the bladder, findings secondary to right-sided pubic rami fractures. IMPRESSION: 1. Progressive medial left upper lobe soft tissue density most consistent with progressive radiation fibrosis; recurrent tumor cannot be excluded. Recommend short-interval chest CT follow-up or PET/CT and oncologic evaluation as indicated. 2. Stable 7 mm left lower lobe pulmonary nodule. 3. Progressive indeterminate nodularity along the adjacent major fissure. Recommend short-interval chest CT follow-up to assess for stability. 4. Right-sided extraperitoneal pelvic hematoma surrounding the bladder, secondary to right-sided pubic rami fractures. Electronically signed by: Maude Stammer MD 11/06/2023 07:42 PM EST RP Workstation: [id number]   DG Pelvis Portable Result Date: 11/06/2023 CLINICAL DATA:  Clemens, pain EXAM: PORTABLE PELVIS 1-2 VIEWS COMPARISON:  12/20/2022 FINDINGS: Supine frontal view of the pelvis was obtained, the patient is slightly rotated toward the left. No acute displaced fracture, subluxation, or dislocation. Joint spaces are relatively well preserved. Prominent atherosclerosis of the aorta and its branches. IMPRESSION: 1. No acute displaced fracture. Electronically Signed   By: Ozell Daring M.D.   On: 11/06/2023 17:22    DG Chest Port 1 View Result Date: 11/06/2023 CLINICAL DATA:  Clemens, right shoulder pain EXAM: PORTABLE CHEST 1 VIEW COMPARISON:  09/06/2022 FINDINGS: Frontal view of the chest was obtained, excluding the right costophrenic angle by collimation. The cardiac silhouette is unremarkable. Increased density in the left suprahilar region compatible with patient's known history of lung cancer, may reflect post therapeutic change given findings on recent CT. Chronic consolidation within the periphery of the superior segment of the left lower lobe again noted and stable. No new airspace disease, effusion, or pneumothorax. There is a displaced impacted fracture through the right humeral neck, incompletely characterized on this exam due to collimation. There are acute displaced right lateral fifth through eighth rib fractures as well. Prior healed left rib fractures are incidentally noted. IMPRESSION: 1. Acute fractures involving the right humeral neck and right lateral fifth through eighth ribs. 2. Chronic left suprahilar density unchanged since recent CT, compatible with known history of lung cancer. This could reflect post therapeutic changes in this region and continued follow-up is recommended. 3. Chronic scarring within the left lower lobe. Electronically Signed   By: Ozell Daring M.D.   On: 11/06/2023 17:21    Family Communication: Discussed with patient, she understand and agree. All questions answered.  Disposition: Status is: Inpatient Remains inpatient appropriate because: pain control, ortho intervention monday, PT/ OT  Planned Discharge Destination: Home with Home Health     Time spent: 44 minutes  Author: Concepcion Riser, MD 11/08/2023 4:42 PM Secure chat 7am to 7pm For on call review [email or web address].

## 2023-11-08 NOTE — Progress Notes (Signed)
 MD: please consider allowing this patient to have a PW since she is unable to move d/t her multiple fractures.   Or a foley catheter?    Thank you, nursing

## 2023-11-08 NOTE — Plan of Care (Signed)

## 2023-11-08 NOTE — Plan of Care (Signed)
  Problem: Activity: Goal: Risk for activity intolerance will decrease 11/08/2023 0124 by Neville Arland SAUNDERS, RN Outcome: Not Progressing 11/08/2023 0015 by Neville Arland SAUNDERS, RN Outcome: Not Progressing   Problem: Coping: Goal: Level of anxiety will decrease Outcome: Not Progressing

## 2023-11-08 NOTE — Plan of Care (Signed)
   Problem: Activity: Goal: Risk for activity intolerance will decrease Outcome: Not Progressing   Problem: Coping: Goal: Level of anxiety will decrease Outcome: Not Progressing

## 2023-11-09 DIAGNOSIS — I5033 Acute on chronic diastolic (congestive) heart failure: Secondary | ICD-10-CM | POA: Diagnosis not present

## 2023-11-09 DIAGNOSIS — J4489 Other specified chronic obstructive pulmonary disease: Secondary | ICD-10-CM | POA: Diagnosis not present

## 2023-11-09 DIAGNOSIS — S42411A Displaced simple supracondylar fracture without intercondylar fracture of right humerus, initial encounter for closed fracture: Secondary | ICD-10-CM | POA: Diagnosis not present

## 2023-11-09 DIAGNOSIS — N1831 Chronic kidney disease, stage 3a: Secondary | ICD-10-CM | POA: Diagnosis not present

## 2023-11-09 LAB — SURGICAL PCR SCREEN
MRSA, PCR: NEGATIVE
Staphylococcus aureus: NEGATIVE

## 2023-11-09 LAB — CBC
HCT: 30.2 % — ABNORMAL LOW (ref 36.0–46.0)
Hemoglobin: 9.7 g/dL — ABNORMAL LOW (ref 12.0–15.0)
MCH: 29.8 pg (ref 26.0–34.0)
MCHC: 32.1 g/dL (ref 30.0–36.0)
MCV: 92.9 fL (ref 80.0–100.0)
Platelets: 215 K/uL (ref 150–400)
RBC: 3.25 MIL/uL — ABNORMAL LOW (ref 3.87–5.11)
RDW: 13.4 % (ref 11.5–15.5)
WBC: 10.6 K/uL — ABNORMAL HIGH (ref 4.0–10.5)
nRBC: 0 % (ref 0.0–0.2)

## 2023-11-09 LAB — BASIC METABOLIC PANEL WITH GFR
Anion gap: 13 (ref 5–15)
BUN: 40 mg/dL — ABNORMAL HIGH (ref 8–23)
CO2: 26 mmol/L (ref 22–32)
Calcium: 8 mg/dL — ABNORMAL LOW (ref 8.9–10.3)
Chloride: 95 mmol/L — ABNORMAL LOW (ref 98–111)
Creatinine, Ser: 1.57 mg/dL — ABNORMAL HIGH (ref 0.44–1.00)
GFR, Estimated: 34 mL/min — ABNORMAL LOW (ref >60)
Glucose, Bld: 99 mg/dL (ref 70–99)
Potassium: 4.2 mmol/L (ref 3.5–5.1)
Sodium: 134 mmol/L — ABNORMAL LOW (ref 135–145)

## 2023-11-09 MED ORDER — DOCUSATE SODIUM 100 MG PO CAPS
100.0000 mg | ORAL_CAPSULE | Freq: Two times a day (BID) | ORAL | Status: DC
  Administered 2023-11-10: 100 mg via ORAL
  Filled 2023-11-09 (×3): qty 1

## 2023-11-09 MED ORDER — SENNOSIDES-DOCUSATE SODIUM 8.6-50 MG PO TABS
2.0000 | ORAL_TABLET | Freq: Every day | ORAL | Status: DC
  Filled 2023-11-09: qty 2

## 2023-11-09 MED ORDER — POLYETHYLENE GLYCOL 3350 17 G PO PACK
17.0000 g | PACK | Freq: Every day | ORAL | Status: DC
  Administered 2023-11-09 – 2023-11-10 (×2): 17 g via ORAL
  Filled 2023-11-09 (×3): qty 1

## 2023-11-09 NOTE — Progress Notes (Signed)
 OT Cancellation Note  Patient Details Name: Teresa Jensen MRN: [id number] DOB: 1945-07-17   Cancelled Treatment:    Reason Eval/Treat Not Completed: Pain limiting ability to participate (Pt declining acute OT eval at this time secondary to significant pain in R shoulder and R hip. OT to reattempt to see pt at a later time as available/appropriate.)  Margarie Rockey HERO., OTR/L, MA Acute Rehab [phone]   [name] 11/09/2023, 11:11 AM

## 2023-11-09 NOTE — Progress Notes (Signed)
 Progress Note   Patient: Teresa Jensen FMW:[id number] DOB: 1945/01/07 DOA: 11/06/2023     3 DOS: the patient was seen and examined on 11/09/2023   Brief hospital course: Teresa Jensen is a 78 y.o. 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  Patient sustained right pubic ramus fracture with hematoma in pelvis, right humeral head fracture.  Trauma surgery cleared her, orthopedic consulted, admitted to TRH service.  Assessment and Plan: Status post fall, multiple injuries Right humerus fracture Closed pubic rami fracture Cleared by trauma service. Orthopedic surgery planning on ORIF right humerus on Monday. Continue pain control with opiate medications.  Acute on chronic hypoxic respiratory failure - In the setting of trauma, pain.  She does have CHF. Continue supplemental oxygen, wean to home 1-2L O2. CT chest ruled out rib fractures. Continue bronchodilators, Lasix  home dose.  Acute on chronic heart failure with preserved ejection fraction- Patient does have basilar rhonchi, increased supplemental oxygen requirement. S/p IV lasix . decreased oral Lasix  to 20 mg daily. Follow daily renal function, electrolytes, weight, strict input and output.  COPD History of pulmonary fibrosis No exacerbation. Home o2 1-2L. Continue bronchodilators as needed.  Essential hypertension: BP lower side.  Caution with antihypertensive regimen.  GERD: Continue Protonix .  Hypothyroidism: Continue home Synthroid .  CKD stage III A- Kidney function stable. Avoid nephrotoxic drugs,  hypertension, contrast. Monitor daily renal function.  Obesity class I- BMI 34.62. Diet, exercise and weight reduction advised.      Out of bed to chair. Incentive spirometry. Nursing supportive care. Fall, aspiration precautions. Diet:  Diet Orders (From admission, onward)     Start     Ordered   11/07/23 1021  Diet Heart Room service appropriate? Yes; Fluid consistency: Thin  Diet effective now       Question Answer Comment  Room service appropriate? Yes   Fluid consistency: Thin      11/07/23 1021           DVT prophylaxis: SCDs Start: 11/07/23 0943  Level of care: Progressive   Code Status: Full Code  Subjective: Patient is seen and examined today morning. She is lying in bed.  Pain better but unable to get out of bed with PT. No family at bedside.  Physical Exam: Vitals:   11/09/23 0001 11/09/23 0323 11/09/23 0823 11/09/23 1202  BP: (!) 109/39 99/69 (!) 113/58 (!) 126/38  Pulse: 87 80 86 93  Resp: 14 14 20 20   Temp: 98.4 F (36.9 C) 98 F (36.7 C) 98.4 F (36.9 C) 100.1 F (37.8 C)  TempSrc: Oral Oral Oral Oral  SpO2: 97% 95% 99% 94%  Weight:      Height:        General - Elderly Caucasian female, no acute distress. HEENT - PERRLA, EOMI, atraumatic head, non tender sinuses. Lung - Clear, diffuse rales, rhonchi, no wheezes. Heart - S1, S2 heard, no murmurs, rubs, trace pedal edema. Abdomen - Soft, non tender, bowel sounds good Neuro - Alert, awake and oriented x 3, non focal exam. Skin - Warm and dry. Right arm sling noted.  Data Reviewed:      Latest Ref  Rng & Units 11/09/2023    3:36 AM 11/08/2023    2:55 AM 11/07/2023    1:17 PM  CBC  WBC 4.0 - 10.5 K/uL 10.6  13.0  12.9   Hemoglobin 12.0 - 15.0 g/dL 9.7  89.8  89.6   Hematocrit 36.0 - 46.0 % 30.2  31.9  33.2   Platelets 150 - 400 K/uL 215  251  273       Latest Ref Rng & Units 11/09/2023    3:36 AM 11/08/2023    2:55 AM 11/07/2023   11:03 AM  BMP  Glucose 70 - 99 mg/dL 99  882  869    BUN 8 - 23 mg/dL 40  31  26   Creatinine 0.44 - 1.00 mg/dL 8.42  8.54  8.77   Sodium 135 - 145 mmol/L 134  132  138   Potassium 3.5 - 5.1 mmol/L 4.2  4.5  5.0   Chloride 98 - 111 mmol/L 95  97  104   CO2 22 - 32 mmol/L 26  23  24    Calcium  8.9 - 10.3 mg/dL 8.0  7.9  8.5    No results found.   Family Communication: Discussed with patient, she understand and agree. All questions answered.  Disposition: Status is: Inpatient Remains inpatient appropriate because: pain control, ortho intervention monday, PT/ OT  Planned Discharge Destination: Home with Home Health     Time spent: 43 minutes  Author: Concepcion Riser, MD 11/09/2023 2:56 PM Secure chat 7am to 7pm For on call review [email or web address].

## 2023-11-09 NOTE — Progress Notes (Signed)
 PT Cancellation Note  Patient Details Name: Teresa Jensen MRN: [id number] DOB: 23-Aug-1945   Cancelled Treatment:    Reason Eval/Treat Not Completed: Patient declined, no reason specified  Patient requests to defer PT/OT evaluations today due to pain. If schedule allows, I will attempt to check back this afternoon.   Leontine Roads, PT, DPT [facility]  Rehabilitation Services Physical Therapist Office: [phone] Website: [email or web address]    Leontine GORMAN Roads 11/09/2023, 11:10 AM

## 2023-11-09 NOTE — Plan of Care (Signed)
   Problem: Activity: Goal: Risk for activity intolerance will decrease Outcome: Not Progressing   Problem: Coping: Goal: Level of anxiety will decrease Outcome: Not Progressing

## 2023-11-09 NOTE — Plan of Care (Signed)
  Problem: Coping: Goal: Level of anxiety will decrease Outcome: Progressing   Problem: Nutrition: Goal: Adequate nutrition will be maintained Outcome: Progressing   Problem: Pain Managment: Goal: General experience of comfort will improve and/or be controlled Outcome: Progressing   Problem: Safety: Goal: Ability to remain free from injury will improve Outcome: Progressing

## 2023-11-09 NOTE — Progress Notes (Signed)
 Pt's mews yellow due to HR (110s) and temp of 102.2, pt asymptomatic . PRN tylenol  given , provider and charge RN notified . No new orders or interventions at this time, will continue to assess. Call bell within reach , pt has no concerns or questions at this time.   11/09/23 2020  Assess: MEWS Score  Temp (!) 102.2 F (39 C)  BP (!) 140/70  MAP (mmHg) 83  Pulse Rate (!) 110  ECG Heart Rate (!) 110  Resp 20  Level of Consciousness Alert  SpO2 91 %  O2 Device Nasal Cannula  Assess: MEWS Score  MEWS Temp 2  MEWS Systolic 0  MEWS Pulse 1  MEWS RR 0  MEWS LOC 0  MEWS Score 3  MEWS Score Color Yellow  Assess: if the MEWS score is Yellow or Red  Were vital signs accurate and taken at a resting state? Yes  Does the patient meet 2 or more of the SIRS criteria? Yes  Does the patient have a confirmed or suspected source of infection? No  MEWS guidelines implemented  Yes, yellow  Treat  MEWS Interventions Considered administering scheduled or prn medications/treatments as ordered  Take Vital Signs  Increase Vital Sign Frequency  Yellow: Q2hr x1, continue Q4hrs until patient remains green for 12hrs  Escalate  MEWS: Escalate Yellow: Discuss with charge nurse and consider notifying provider and/or RRT  Notify: Charge Nurse/RN  Name of Charge Nurse/RN Notified Tim , RN  Provider Notification  Provider Name/Title Mansy MD  Date Provider Notified 11/09/23  Time Provider Notified 2039  Method of Notification Justin  Notification Reason Other (Comment) (Yellow mews due tp HR 110s and temp of 102.2, pt asymptotic , given prn tylenol  .)  Provider response Evaluate remotely (provider aware no new orders or interventions at this time.)  Date of Provider Response 11/09/23  Time of Provider Response 2043  Assess: SIRS CRITERIA  SIRS Temperature  1  SIRS Respirations  0  SIRS Pulse 1  SIRS WBC 0  SIRS Score Sum  2

## 2023-11-09 NOTE — Plan of Care (Signed)

## 2023-11-09 NOTE — Progress Notes (Signed)
 Patient seems delirious, she was calling out her husband, asking staffs to type message in the TV, informed to the MD, IV opioids d/c.

## 2023-11-10 ENCOUNTER — Inpatient Hospital Stay (HOSPITAL_COMMUNITY)

## 2023-11-10 DIAGNOSIS — I4891 Unspecified atrial fibrillation: Secondary | ICD-10-CM

## 2023-11-10 DIAGNOSIS — J9611 Chronic respiratory failure with hypoxia: Secondary | ICD-10-CM

## 2023-11-10 DIAGNOSIS — J4489 Other specified chronic obstructive pulmonary disease: Secondary | ICD-10-CM | POA: Diagnosis not present

## 2023-11-10 DIAGNOSIS — N1831 Chronic kidney disease, stage 3a: Secondary | ICD-10-CM | POA: Diagnosis not present

## 2023-11-10 DIAGNOSIS — R509 Fever, unspecified: Secondary | ICD-10-CM

## 2023-11-10 DIAGNOSIS — J8410 Pulmonary fibrosis, unspecified: Secondary | ICD-10-CM | POA: Diagnosis not present

## 2023-11-10 DIAGNOSIS — I5032 Chronic diastolic (congestive) heart failure: Secondary | ICD-10-CM | POA: Diagnosis not present

## 2023-11-10 DIAGNOSIS — J9610 Chronic respiratory failure, unspecified whether with hypoxia or hypercapnia: Secondary | ICD-10-CM | POA: Diagnosis not present

## 2023-11-10 DIAGNOSIS — Z01810 Encounter for preprocedural cardiovascular examination: Secondary | ICD-10-CM | POA: Diagnosis not present

## 2023-11-10 DIAGNOSIS — S42411A Displaced simple supracondylar fracture without intercondylar fracture of right humerus, initial encounter for closed fracture: Secondary | ICD-10-CM | POA: Diagnosis not present

## 2023-11-10 LAB — ECHOCARDIOGRAM COMPLETE
Area-P 1/2: 3.85 cm2
Height: 57 in
S' Lateral: 2.6 cm
Weight: 2560 [oz_av]

## 2023-11-10 LAB — CBC
HCT: 31.6 % — ABNORMAL LOW (ref 36.0–46.0)
Hemoglobin: 10.2 g/dL — ABNORMAL LOW (ref 12.0–15.0)
MCH: 29.8 pg (ref 26.0–34.0)
MCHC: 32.3 g/dL (ref 30.0–36.0)
MCV: 92.4 fL (ref 80.0–100.0)
Platelets: 235 K/uL (ref 150–400)
RBC: 3.42 MIL/uL — ABNORMAL LOW (ref 3.87–5.11)
RDW: 13.2 % (ref 11.5–15.5)
WBC: 10.5 K/uL (ref 4.0–10.5)
nRBC: 0 % (ref 0.0–0.2)

## 2023-11-10 LAB — BASIC METABOLIC PANEL WITH GFR
Anion gap: 15 (ref 5–15)
BUN: 45 mg/dL — ABNORMAL HIGH (ref 8–23)
CO2: 24 mmol/L (ref 22–32)
Calcium: 8 mg/dL — ABNORMAL LOW (ref 8.9–10.3)
Chloride: 94 mmol/L — ABNORMAL LOW (ref 98–111)
Creatinine, Ser: 1.36 mg/dL — ABNORMAL HIGH (ref 0.44–1.00)
GFR, Estimated: 40 mL/min — ABNORMAL LOW (ref >60)
Glucose, Bld: 121 mg/dL — ABNORMAL HIGH (ref 70–99)
Potassium: 4.2 mmol/L (ref 3.5–5.1)
Sodium: 133 mmol/L — ABNORMAL LOW (ref 135–145)

## 2023-11-10 LAB — BRAIN NATRIURETIC PEPTIDE: B Natriuretic Peptide: 159 pg/mL — ABNORMAL HIGH (ref 0.0–100.0)

## 2023-11-10 MED ORDER — SODIUM CHLORIDE 0.9 % IV BOLUS
250.0000 mL | Freq: Once | INTRAVENOUS | Status: AC
  Administered 2023-11-10: 250 mL via INTRAVENOUS

## 2023-11-10 MED ORDER — AMIODARONE HCL IN DEXTROSE 360-4.14 MG/200ML-% IV SOLN
30.0000 mg/h | INTRAVENOUS | Status: DC
  Administered 2023-11-10 – 2023-11-11 (×2): 30 mg/h via INTRAVENOUS
  Filled 2023-11-10 (×2): qty 200

## 2023-11-10 MED ORDER — AMIODARONE LOAD VIA INFUSION
150.0000 mg | Freq: Once | INTRAVENOUS | Status: AC
  Administered 2023-11-10: 150 mg via INTRAVENOUS
  Filled 2023-11-10: qty 83.34

## 2023-11-10 MED ORDER — AMIODARONE HCL IN DEXTROSE 360-4.14 MG/200ML-% IV SOLN
60.0000 mg/h | INTRAVENOUS | Status: AC
  Administered 2023-11-10 (×2): 60 mg/h via INTRAVENOUS
  Filled 2023-11-10 (×2): qty 200

## 2023-11-10 MED ORDER — DILTIAZEM HCL 25 MG/5ML IV SOLN
10.0000 mg | Freq: Once | INTRAVENOUS | Status: AC
  Administered 2023-11-10: 10 mg via INTRAVENOUS
  Filled 2023-11-10: qty 5

## 2023-11-10 NOTE — Progress Notes (Addendum)
 Progress Note   Patient: Teresa Jensen FMW:[id number] DOB: June 01, 1945 DOA: 11/06/2023     4 DOS: the patient was seen and examined on 11/10/2023   Brief hospital course: Teresa Jensen is a 78 y.o. 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  Patient sustained right pubic ramus fracture with hematoma in pelvis, right humeral head fracture.  Trauma surgery cleared her, orthopedic consulted, admitted to TRH service.  Assessment and Plan: Status post fall, multiple injuries Right humerus fracture Closed pubic rami fracture Cleared by trauma service. Orthopedic surgery planning on ORIF right humerus on Monday. Continue pain control, IV morphine stopped due to confusion last night. She is on oral opiate meds.  New onset Afib with RVR- Early AM she had HR> 140, due to he borderline low BP, Amiodarone drip initiated. She is sinus this AM. Cardiology consult appreciated, continue Amio drip till surgery, no anticoagulation until Afib recurs, outpatient holter monitor per cardiology. Continue telemetry.   Febrile illness- Transient confusion, afib along with fever. Blood/ Urine cultures ordered. Chest xray rule out infiltrate. If fever persists, will consider antibiotics.  Chronic hypoxic respiratory failure - Hypoxia, oxygen requirement worsened in the setting of trauma, pain.  She does have CHF. Continue supplemental oxygen, wean to home 1-2L O2. CT chest ruled out rib fractures. Continue bronchodilators, Lasix  home dose.  Chronic heart failure with preserved ejection  fraction- Patient does have basilar rhonchi, rales, increased supplemental oxygen requirement. S/p IV lasix . decreased oral Lasix  to 20 mg daily. Follow daily renal function, electrolytes, weight, strict input and output.  COPD History of pulmonary fibrosis No exacerbation. Home o2 1-2L. Continue bronchodilators as needed.  Essential hypertension: BP lower side.  Caution with antihypertensive regimen.  GERD: Continue Protonix .  Hypothyroidism: Continue home Synthroid .  CKD stage III A- Kidney function stable. Avoid nephrotoxic drugs, hypertension, contrast. Monitor daily renal function.  Obesity class I- BMI 34.62. Diet, exercise and weight reduction advised.      Out of bed to chair. Incentive spirometry. Nursing supportive care. Fall, aspiration precautions. Diet:  Diet Orders (From admission, onward)     Start     Ordered   11/07/23 1021  Diet Heart Room service appropriate? Yes; Fluid consistency: Thin  Diet effective now       Question Answer Comment  Room service appropriate? Yes   Fluid consistency: Thin      11/07/23 1021           DVT prophylaxis: SCDs Start: 11/07/23 0943  Level of care: Progressive   Code Status: Full Code  Subjective: Patient is seen and examined today morning. She is sitting in chair. Last night was confused per RN. Tmax 102.2. She has an episode of afib with RVR early am. Pain better. No family at bedside.  Physical Exam: Vitals:   11/10/23 0449 11/10/23 0801 11/10/23 0900 11/10/23 1000  BP:  (!) 109/57 (!) 119/41 (!) 124/51  Pulse: 76 74  93  Resp: 20 18 20 20   Temp:  99 F (37.2 C)    TempSrc:  Oral    SpO2: 100% 100%  94%  Weight:  Height:        General - Elderly Caucasian female, no acute distress. HEENT - PERRLA, EOMI, atraumatic head, non tender sinuses. Lung - Clear, diffuse rales, rhonchi, no wheezes. Heart - S1, S2 heard, no murmurs, rubs, trace pedal edema. Abdomen - Soft, non tender, bowel sounds  good Neuro - Alert, awake and oriented x 3, non focal exam. Skin - Warm and dry. Right arm sling noted.  Data Reviewed:      Latest Ref Rng & Units 11/10/2023    2:51 AM 11/09/2023    3:36 AM 11/08/2023    2:55 AM  CBC  WBC 4.0 - 10.5 K/uL 10.5  10.6  13.0   Hemoglobin 12.0 - 15.0 g/dL 89.7  9.7  89.8   Hematocrit 36.0 - 46.0 % 31.6  30.2  31.9   Platelets 150 - 400 K/uL 235  215  251       Latest Ref Rng & Units 11/10/2023    2:51 AM 11/09/2023    3:36 AM 11/08/2023    2:55 AM  BMP  Glucose 70 - 99 mg/dL 878  99  882   BUN 8 - 23 mg/dL 45  40  31   Creatinine 0.44 - 1.00 mg/dL 8.63  8.42  8.54   Sodium 135 - 145 mmol/L 133  134  132   Potassium 3.5 - 5.1 mmol/L 4.2  4.2  4.5   Chloride 98 - 111 mmol/L 94  95  97   CO2 22 - 32 mmol/L 24  26  23    Calcium  8.9 - 10.3 mg/dL 8.0  8.0  7.9    No results found.   Family Communication: Discussed with patient, she understand and agree. All questions answered.  Disposition: Status is: Inpatient Remains inpatient appropriate because: amio drip, pain control, fever work up, ortho intervention monday, cardiology follow up, PT/ OT  Planned Discharge Destination: Home with Home Health     Time spent: 53 minutes  Author: Concepcion Riser, MD 11/10/2023 11:34 AM Secure chat 7am to 7pm For on call review [email or web address].

## 2023-11-10 NOTE — Progress Notes (Signed)
 Pt now NSR 70s.

## 2023-11-10 NOTE — Progress Notes (Addendum)
 Per tele tech pt hR is a-fib 140s sustained , EKG completed shows a-fib rvr . Pt asymptomatic provider notified, see new orders. Vitals stable.

## 2023-11-10 NOTE — Progress Notes (Signed)
 Echocardiogram 2D Echocardiogram has been performed.  Teresa Jensen 11/10/2023, 2:15 PM

## 2023-11-10 NOTE — Evaluation (Signed)
 Physical Therapy Evaluation Patient Details Name: Teresa Jensen MRN: [id number] DOB: 05/16/1945 Today's Date: 11/10/2023  History of Present Illness  Pt is a 78 y.o. female who presented 11/06/23 after fall. Xrays of the right shoulder demonstrate a displaced proximal humerus fracture. CT pelvis demonstrates right sided pubic rami fractures. Plan for right reverse TSA 11/10 and non-op management of RLE. 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   Clinical Impression  Pt admitted with above diagnosis. PTA, pt was independent with functional mobility, ADLs, and IADLs. She lives with her husband in a two story townhouse with a level entry. Pt is able to reside on the main level. Pt currently with functional limitations due to the deficits listed below (see PT Problem List). She required maxA x2 for bed mobility and maxA x2 for bed>chair transfer. Attempted sit<>stand but pt was unable to clear hip and demonstrated an excessive forward lean. She is currently limited by pain, weight bearing status (RUE NWB), impaired balance, generalized weakness, and decreased activity tolerance. Pt will benefit from acute skilled PT to increase her independence and safety with mobility to allow discharge. Recommend continued inpatient follow up therapy, <3 hours/day.    If plan is discharge home, recommend the following: Two people to help with walking and/or transfers;A lot of help with bathing/dressing/bathroom;Assistance with cooking/housework;Assist for transportation;Help with stairs or ramp for entrance   Can travel by private vehicle   No    Equipment Recommendations Hospital bed;Hoyer lift;Wheelchair (measurements PT);Wheelchair cushion  (measurements PT)  Recommendations for Other Services       Functional Status Assessment Patient has had a recent decline in their functional status and demonstrates the ability to make significant improvements in function in a reasonable and predictable amount of time.     Precautions / Restrictions Precautions Precautions: Fall Recall of Precautions/Restrictions: Impaired Required Braces or Orthoses: Sling Restrictions Weight Bearing Restrictions Per Provider Order: Yes RUE Weight Bearing Per Provider Order: Non weight bearing RLE Weight Bearing Per Provider Order: Weight bearing as tolerated      Mobility  Bed Mobility Overal bed mobility: Needs Assistance Bed Mobility: Supine to Sit     Supine to sit: Max assist, +2 for physical assistance, +2 for safety/equipment, HOB elevated, Used rails     General bed mobility comments: Pt sat up on R side of bed with increased time. Cues for sequencing. Pt with minimal initiation to move either LE despite assist for RLE. Use of bed pad to pivot pt to EOB. She pulled on rail with LUE. Slowly scooted pt fwd til feet flat.    Transfers Overall transfer level: Needs assistance Equipment used: 2 person hand held assist Transfers: Sit to/from Stand, Bed to chair/wheelchair/BSC Sit to Stand: Max assist, +2 physical assistance     Squat pivot transfers: Max assist, +2 physical assistance     General transfer comment: Attempted to stand from lowest bed height. Pt unable to clear hips and with excessive fwd lean. Use of bed pad to facilitate hip ext. Cued pt to bring trunk upright. Pt quickly fatigued, returned to resting sitting EOB. Transferred to recliner chair on right postioned diagonal to bed. Completed squat pivot transfer with PT and Therapy tech facilitating hip rotation with aid of pad.    Ambulation/Gait  General Gait Details: Unable  Stairs            Wheelchair Mobility     Tilt Bed     Modified Rankin (Stroke Patients Only)       Balance Overall balance assessment: Needs assistance Sitting-balance support: Single extremity supported, Feet supported Sitting balance-Leahy Scale: Fair Sitting balance - Comments: Pt sat EOB with CGA. She demonstrated a L lateral lean to offload hip d/t pain. Occasional posterior lean as she fatigued.   Standing balance support: Single extremity supported, During functional activity Standing balance-Leahy Scale: Poor Standing balance comment: Pt dependent on external support of PT and Therapy Tech. She demonstrated excessive anterior lean during sit<>stand attempt.                             Pertinent Vitals/Pain Pain Assessment Pain Assessment: Faces Faces Pain Scale: Hurts whole lot Pain Location: R shoulder/upper arm and R hip Pain Descriptors / Indicators: Grimacing, Guarding, Discomfort, Aching Pain Intervention(s): Premedicated before session, Monitored during session, Limited activity within patient's tolerance, Repositioned    Home Living Family/patient expects to be discharged to:: Private residence Living Arrangements: Spouse/significant other Available Help at Discharge: Family;Available 24 hours/day Type of Home: House [facility]) Home Access: Level entry       Home Layout: Two level;Able to live on main level with bedroom/bathroom;Full bath on main level Home Equipment: Hand held shower head;Shower seat - built in Additional Comments: Pt has a portable oxygen tank and carrier at home    Prior Function Prior Level of Function : Independent/Modified Independent             Mobility Comments: Ambulates without AD. 1 fall leading to admit, when trying to watch the leaves been blown outside and tripped over her O2 tank. ADLs Comments: Indep with ADLs. Manages her own meds. Doesn't drive.     Extremity/Trunk Assessment   Upper Extremity Assessment Upper Extremity Assessment: Defer to OT  evaluation    Lower Extremity Assessment Lower Extremity Assessment: RLE deficits/detail RLE Deficits / Details: Decreased hip and knee AROM. Pt resistant to PROM attempts. Grossly 2/5 strength. RLE: Unable to fully assess due to pain RLE Sensation: WNL RLE Coordination: decreased gross motor    Cervical / Trunk Assessment Cervical / Trunk Assessment: Other exceptions (Increased Body Habitus) Cervical / Trunk Exceptions: Increased Body Habitus  Communication   Communication Communication: No apparent difficulties    Cognition Arousal: Alert Behavior During Therapy: WFL for tasks assessed/performed, Anxious   PT - Cognitive impairments: No apparent impairments                       PT - Cognition Comments: Pt A,Ox4. She was very nervous to mobilize d/t anticipation of pain and fear of falling. Following commands: Impaired Following commands impaired: Follows one step commands with increased time     Cueing Cueing Techniques: Verbal cues, Gestural cues, Tactile cues     General Comments General comments (skin integrity, edema, etc.): Adjusted RUE sling for improved support and immobilization. VSS on 2L O2 via [place]    Exercises     Assessment/Plan    PT Assessment Patient needs continued PT services  PT Problem List Decreased strength;Decreased range of motion;Decreased activity tolerance;Decreased balance;Decreased mobility;Decreased cognition;Decreased knowledge of use of DME;Decreased safety awareness;Pain       PT Treatment Interventions DME instruction;Gait training;Functional mobility training;Therapeutic activities;Therapeutic exercise;Patient/family education;Wheelchair mobility training  PT Goals (Current goals can be found in the Care Plan section)  Acute Rehab PT Goals Patient Stated Goal: Have less pain and move better PT Goal Formulation: With patient/family Time For Goal Achievement: 11/24/23 Potential to Achieve Goals: Fair    Frequency Min  2X/week     Co-evaluation               AM-PAC PT 6 Clicks Mobility  Outcome Measure Help needed turning from your back to your side while in a flat bed without using bedrails?: Total Help needed moving from lying on your back to sitting on the side of a flat bed without using bedrails?: Total Help needed moving to and from a bed to a chair (including a wheelchair)?: Total Help needed standing up from a chair using your arms (e.g., wheelchair or bedside chair)?: Total Help needed to walk in hospital room?: Total Help needed climbing 3-5 steps with a railing? : Total 6 Click Score: 6    End of Session Equipment Utilized During Treatment: Gait belt;Oxygen Activity Tolerance: Patient tolerated treatment well;Patient limited by pain;Patient limited by fatigue Patient left: in chair;with call bell/phone within reach;with chair alarm set;with family/visitor present Nurse Communication: Mobility status;Need for lift equipment PT Visit Diagnosis: Pain;Difficulty in walking, not elsewhere classified (R26.2);Other abnormalities of gait and mobility (R26.89);Unsteadiness on feet (R26.81) Pain - Right/Left: Right Pain - part of body: Shoulder;Hip    Time: 9062-8989 PT Time Calculation (min) (ACUTE ONLY): 33 min   Charges:   PT Evaluation $PT Eval Moderate Complexity: 1 Mod   PT General Charges $$ ACUTE PT VISIT: 1 Visit         Randall SAUNDERS, PT, DPT Acute Rehabilitation Services Office: [phone] Secure Chat Preferred  Delon CHRISTELLA Callander 11/10/2023, 12:59 PM

## 2023-11-10 NOTE — Consult Note (Signed)
 Cardiology Consultation   Patient ID: ANGELIA Jensen MRN: [id number]; DOB: [birth year]  Admit date: 11/06/2023 Date of Consult: 11/10/2023  PCP:  Gretta Comer POUR, NP   [facility] Providers Cardiologist:  Lonni Hanson, MD       Patient Profile: Teresa Jensen is a 78 y.o. female with a hx of hronic HFpEF, pulm HTN, chronic respiratory failure on home O2, lung cancer, COPD, HTN, HLD, PAD who is being seen 11/10/2023 for the evaluation of new onset afib at the request of Dr. Lewayne  History of Present Illness: Ms. Basic 78 yo female history of chronic HFpEF, pulm HTN, chronic respiratory failure on home O2, lung cancer, COPD, HTN, HLD, PAD, admitted 11/06/23 after fall at home. Mechanical fall at home, tripped on door step. Multiple fracutres including rib fracture, pubic ramus hematoma, right humeral head fracture. During admission episode of afib. Cardiology consulted to assist with management and also preoperative assessment for surgery for humeral fracture.  Onset of afib around 130AM this morning, she was asymptomatic. Soft bp's, primary team started amiodarone. Converted to SR around 430AM.      Admit labs WBC 17.3 Hgb 11.1 Plt 321 Cr 1.20 BUN 36  Past Medical History:  Diagnosis Date   (HFpEF) heart failure with preserved ejection fraction (HCC) 06/28/2011   a.) TTE 06/28/2011: EF 55-60%, mild AR, G1DD; b.) TTE 01/16/2013: EF 55-60%, mild MR; c.) TTE 02/16/2018: EF 60-65%, G1DD; d.) MPI 11/26/2018: EF 56%   Abnormal electrocardiogram 11/12/2018   Acute kidney failure 01/12/2017   Acute on chronic respiratory failure with hypoxia (HCC) 09/06/2022   Adenoma of left adrenal gland    Anemia    Anxiety    Aortic atherosclerosis    Bilateral carpal tunnel syndrome    CAD (coronary artery disease)    a.) HR CT chest 10/30/2021: 2 vessel CAD (LAD/RCA)   CAP (community acquired pneumonia)    COPD (chronic obstructive pulmonary disease) (HCC)    COPD  exacerbation (HCC) 01/21/2018   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   Diverticulosis    Dyspnea    DUE TO HEAT WITH COPD   Epistaxis 01/09/2017   GERD (gastroesophageal reflux disease)    Herpes zoster 07/08/2015   Hypertension    Hypothyroidism    Iatrogenic hypotension 01/12/2017   Iliac artery stenosis, bilateral    Morbid obesity with BMI of 40.0-44.9, adult (HCC)    New onset of headaches    Osteopenia    PMR (polymyalgia rheumatica)    Pneumonia 02/13/2018   PONV (postoperative nausea and vomiting)    Pulmonary nodule, left    a.) CT chest 10/30/2021: 1.5 cm medial LUL nodule; b.) PET CT 11/21/2021: hypermetabolic LUL nodule (SUX max 9.3)   S/P carpal tunnel release 08/04/2020   Shingles    Stasis dermatitis    Strain of chest wall 01/20/2011   Tobacco abuse    Vitamin D  deficiency     Past Surgical History:  Procedure Laterality Date   ANTERIOR CERVICAL DECOMP/DISCECTOMY FUSION  06/22/2004   C4-C6   APPENDECTOMY     CARPAL TUNNEL RELEASE Left 08/31/2019   Procedure: CARPAL TUNNEL RELEASE;  Surgeon: Mardee Lynwood SQUIBB, MD;  Location: [facility] ORS;  Service: Orthopedics;  Laterality: Left;   CARPAL TUNNEL RELEASE Right 06/22/2020   Procedure: CARPAL TUNNEL RELEASE;  Surgeon: Mardee Lynwood SQUIBB, MD;  Location: [facility];  Service: Orthopedics;  Laterality: Right;   CHOLECYSTECTOMY     COLONOSCOPY     ENDOBRONCHIAL ULTRASOUND  Left 01/17/2022   Procedure: ENDOBRONCHIAL ULTRASOUND;  Surgeon: Tamea Dedra CROME, MD;  Location: [facility] OR[facility];  Service: Pulmonary;  Laterality: Left;   ESOPHAGOGASTRODUODENOSCOPY     HEMORRHOID SURGERY     TOTAL ABDOMINAL HYSTERECTOMY W/ BILATERAL SALPINGOOPHORECTOMY     TUBAL LIGATION       Scheduled Meds:  atorvastatin   10 mg Oral Daily   docusate sodium   100 mg Oral BID   furosemide   20 mg Oral Daily   levothyroxine   75 mcg Oral Q0600   methocarbamol  500 mg Oral TID   pantoprazole   40 mg Oral QHS   polyethylene glycol  17 g Oral Daily    senna-docusate  2 tablet Oral QHS   spironolactone   12.5 mg Oral Daily   Continuous Infusions:  amiodarone 30 mg/hr (11/10/23 1009)   PRN Meds: acetaminophen  **OR** acetaminophen , albuterol , HYDROcodone -acetaminophen , metoCLOPramide  (REGLAN ) injection, ondansetron  **OR** ondansetron  (ZOFRAN ) IV  Allergies:    Allergies  Allergen Reactions   Epinephrine  Anaphylaxis and Shortness Of Breath   Food Rash    MANGO = cause rashes around the mouth LIPS GO NUMB   Mangifera Indica Rash    THIS IS MANGO LIPS GO NUMB   Prednisone  Anaphylaxis, Hives, Shortness Of Breath, Itching and Swelling    The face swells HAS TAKEN CORTISONE SHOTS WITHOUT ISSUE BEFORE Cannot take oral tablets either   Ivp Dye [Iodinated Contrast Media] Hives   Lactose Intolerance (Gi) Diarrhea   Prevnar 13 [Pneumococcal 13-Val Conj Vacc] Other (See Comments)    Arm temp elevated at site of injection, red, itching    Social History:   Social History   Socioeconomic History   Marital status: Married    Spouse name: [name]   Number of children: 1   Years of education: Not on file   Highest education level: 12th grade  Occupational History   Occupation: Catering Manager as a Paramedic: RETIRED    Comment: retired  Tobacco Use   Smoking status: Former    Current packs/day: 0.00    Average packs/day: 1.5 packs/day for 20.0 years (30.0 ttl pk-yrs)    Types: Cigarettes    Start date: 02/11/1989    Quit date: 02/11/2009    Years since quitting: 14.7   Smokeless tobacco: Never  Vaping Use   Vaping status: Never Used  Substance and Sexual Activity   Alcohol use: No    Alcohol/week: 0.0 standard drinks of alcohol   Drug use: No   Sexual activity: Not Currently  Other Topics Concern   Not on file  Social History Narrative   Lives in [place] with her husband.  Takes care of her two grandchildren, ages 59 and 2, everyday in [place].  Has one son who is 75.   Desires CPR   Would not want  prolonged life support if futile   Social Drivers of Health   Financial Resource Strain: Patient Declined (05/01/2023)   Overall Financial Resource Strain (CARDIA)    Difficulty of Paying Living Expenses: Patient declined  Food Insecurity: Patient Declined (11/08/2023)   Hunger Vital Sign    Worried About Running Out of Food in the Last Year: Patient declined    Ran Out of Food in the Last Year: Patient declined  Transportation Needs: Patient Declined (11/08/2023)   PRAPARE - Administrator, Civil Service (Medical): Patient declined    Lack of Transportation (Non-Medical): Patient declined  Physical Activity: Sufficiently Active (05/01/2023)   Exercise Vital Sign  Days of Exercise per Week: 3 days    Minutes of Exercise per Session: 60 min  Stress: No Stress Concern Present (05/01/2023)   [employer] of Occupational Health - Occupational Stress Questionnaire    Feeling of Stress : Only a little  Social Connections: Unknown (11/08/2023)   Social Connection and Isolation Panel    Frequency of Communication with Friends and Family: Patient declined    Frequency of Social Gatherings with Friends and Family: Not on file    Attends Religious Services: Patient declined    Active Member of Clubs or Organizations: Patient declined    Attends Banker Meetings: Patient declined    Marital Status: Patient declined  Intimate Partner Violence: Patient Declined (11/08/2023)   Humiliation, Afraid, Rape, and Kick questionnaire    Fear of Current or Ex-Partner: Patient declined    Emotionally Abused: Patient declined    Physically Abused: Patient declined    Sexually Abused: Patient declined    Family History:    Family History  Problem Relation Age of Onset   Heart attack Mother 28   Cancer Father        lung/chest wall    Prostate cancer Father    Diabetes Brother        siblings   Hypertension Sister        x 2   Heart disease Brother    Breast cancer  Paternal Grandmother    Heart disease Sister    Diabetes Sister    Tuberculosis Sister    Cirrhosis Brother      ROS:  Please see the history of present illness.   All other ROS reviewed and negative.     Physical Exam/Data: Vitals:   11/10/23 0421 11/10/23 0449 11/10/23 0801 11/10/23 0900  BP: 102/61  (!) 109/57 (!) 119/41  Pulse: (!) 110 76 74   Resp: 20 20 18 20   Temp: 97.7 F (36.5 C)  99 F (37.2 C)   TempSrc: Oral  Oral   SpO2: 99% 100% 100%   Weight:      Height:        Intake/Output Summary (Last 24 hours) at 11/10/2023 1023 Last data filed at 11/10/2023 0509 Gross per 24 hour  Intake 297.49 ml  Output 350 ml  Net -52.51 ml      11/06/2023    4:40 PM 10/22/2023    9:10 AM 10/14/2023    8:36 AM  Last 3 Weights  Weight (lbs) 160 lb 168 lb 3.2 oz 168 lb 9.6 oz  Weight (kg) 72.576 kg 76.295 kg 76.476 kg     Body mass index is 34.62 kg/m.  General:  Well nourished, well developed, in no acute distress HEENT: normal Neck: no JVD Vascular: No carotid bruits; Distal pulses 2+ bilaterally Cardiac:  normal S1, S2; RRR; no murmur  Lungs:  clear to auscultation bilaterally, no wheezing, rhonchi or rales  Abd: soft, nontender, no hepatomegaly  Ext: no edema Musculoskeletal:  No deformities, BUE and BLE strength normal and equal Skin: warm and dry  Neuro:  CNs 2-12 intact, no focal abnormalities noted Psych:  Normal affect     Laboratory Data: High Sensitivity Troponin:  No results for input(s): TROPONINIHS in the last 720 hours.   Chemistry Recent Labs  Lab 11/07/23 1103 11/08/23 0255 11/09/23 0336 11/10/23 0251  NA 138 132* 134* 133*  K 5.0 4.5 4.2 4.2  CL 104 97* 95* 94*  CO2 24 23 26 24   GLUCOSE 130* 117*  99 121*  BUN 26* 31* 40* 45*  CREATININE 1.22* 1.45* 1.57* 1.36*  CALCIUM  8.5* 7.9* 8.0* 8.0*  MG 2.2  --   --   --   GFRNONAA 45* 37* 34* 40*  ANIONGAP 10 12 13 15     Recent Labs  Lab 11/06/23 1800 11/07/23 1103  PROT 7.3 7.0   ALBUMIN 3.3* 3.2*  AST 20 22  ALT 18 18  ALKPHOS 63 57  BILITOT 0.5 0.8   Lipids No results for input(s): CHOL, TRIG, HDL, LABVLDL, LDLCALC, CHOLHDL in the last 168 hours.  Hematology Recent Labs  Lab 11/08/23 0255 11/09/23 0336 11/10/23 0251  WBC 13.0* 10.6* 10.5  RBC 3.39* 3.25* 3.42*  HGB 10.1* 9.7* 10.2*  HCT 31.9* 30.2* 31.6*  MCV 94.1 92.9 92.4  MCH 29.8 29.8 29.8  MCHC 31.7 32.1 32.3  RDW 13.8 13.4 13.2  PLT 251 215 235   Thyroid  No results for input(s): TSH, FREET4 in the last 168 hours.  BNPNo results for input(s): BNP, PROBNP in the last 168 hours.  DDimer No results for input(s): DDIMER in the last 168 hours.  Radiology/Studies:  DG Shoulder Right Result Date: 11/07/2023 CLINICAL DATA:  Fall with right shoulder pain. EXAM: RIGHT SHOULDER - 2+ VIEW COMPARISON:  Chest x-ray 11/06/2023 FINDINGS: Exam demonstrates degenerative changes of the [facility] joint and glenohumeral joints. There is a minimally displaced fracture of the humeral neck/head. No evidence of shoulder dislocation. Suggestion of right posterolateral seventh rib fracture. Diffuse decreased bone mineralization. Remainder of the exam is unchanged. IMPRESSION: 1. Minimally displaced fracture of the humeral neck/head. 2. Suggestion of right posterolateral seventh rib fracture. Electronically Signed   By: Toribio Agreste M.D.   On: 11/07/2023 07:53   CT HEAD WO CONTRAST Result Date: 11/06/2023 EXAM: CT HEAD WITHOUT CONTRAST 11/06/2023 07:27:00 PM TECHNIQUE: CT of the head was performed without the administration of intravenous contrast. Automated exposure control, iterative reconstruction, and/or weight based adjustment of the mA/kV was utilized to reduce the radiation dose to as low as reasonably achievable. COMPARISON: None available. CLINICAL HISTORY: Head trauma, moderate-severe FINDINGS: BRAIN AND VENTRICLES: No acute hemorrhage. No evidence of acute infarct. No hydrocephalus. No extra-axial  collection. No mass effect or midline shift. ORBITS: No acute abnormality. SINUSES: No acute abnormality. SOFT TISSUES AND SKULL: No acute soft tissue abnormality. No skull fracture. IMPRESSION: 1. No acute intracranial abnormality or skull fracture. Electronically signed by: Maude Stammer MD 11/06/2023 07:47 PM EST RP Workstation: [id number]   CT CERVICAL SPINE WO CONTRAST Result Date: 11/06/2023 EXAM: CT CERVICAL SPINE WITHOUT CONTRAST 11/06/2023 07:27:00 PM TECHNIQUE: CT of the cervical spine was performed without the administration of intravenous contrast. Multiplanar reformatted images are provided for review. Automated exposure control, iterative reconstruction, and/or weight based adjustment of the mA/kV was utilized to reduce the radiation dose to as low as reasonably achievable. COMPARISON: None available. CLINICAL HISTORY: Polytrauma, blunt FINDINGS: CERVICAL SPINE: BONES AND ALIGNMENT: Normal alignment of the cervical vertebral bodies. No acute cervical spine fracture is identified. Surgical changes related to prior C4-C5 and C5-C6 anterior interbody fusion. No complicating features are identified. The facets are normally aligned. No fractures. DEGENERATIVE CHANGES: The spinal canal is fairly generous. No significant canal stenosis. Small focal calcified disc protrusion noted at C3-C4 with mass effect on the central aspect of the thecal sac. There is also moderate left foraminal stenosis at this level. SOFT TISSUES: No abnormal prevertebral soft tissue swelling. No neck mass, adenopathy, or hematoma. The lung apices are grossly  clear. Advanced emphysematous changes and pulmonary scarring noted. IMPRESSION: 1. No acute cervical spine fracture. 2. Small calcified disc protrusion at C3-4 with thecal sac mass effect and moderate left foraminal stenosis. 3. Postsurgical C4-5 and C5-6 anterior interbody fusion without complicating features. Electronically signed by: Maude Stammer MD 11/06/2023 07:46 PM  EST RP Workstation: [id number]   CT CHEST ABDOMEN PELVIS WO CONTRAST Result Date: 11/06/2023 EXAM: CT CHEST, ABDOMEN AND PELVIS WITHOUT CONTRAST 11/06/2023 07:27:00 PM TECHNIQUE: CT of the chest, abdomen and pelvis was performed without the administration of intravenous contrast. Multiplanar reformatted images are provided for review. Automated exposure control, iterative reconstruction, and/or weight based adjustment of the mA/kV was utilized to reduce the radiation dose to as low as reasonably achievable. COMPARISON: Prior chest CT 07/29/2023 and prior abdominal/pelvic CT scan 12/20/2022. CLINICAL HISTORY: FINDINGS: CHEST: MEDIASTINUM AND LYMPH NODES: The heart is normal in size. No pericardial effusion. Stable tortuosity and calcification of the thoracic aorta and Sabrie Moritz vessels. Stable 3-vessel coronary artery calcifications. Stable scattered mediastinal and hilar lymph nodes but no new or progressive findings. The central airways are clear. The esophagus is grossly normal. LUNGS AND PLEURA: Progressive medial left upper lobe soft tissue density most likely reflecting progressive radiation fibrosis could not totally exclude the possibility of recurrent tumor. Stable advanced emphysematous changes and pulmonary scarring. Stable 7 mm nodular lesion in the left lower lobe on image 57/06. Progressive nodularity along the nearby major fissure is indeterminate. No new pulmonary nodules. No acute pulmonary process. No pulmonary contusion, pneumothorax, or pleural effusion. ABDOMEN AND PELVIS: LIVER: No acute hepatic injury or perihepatic fluid collection. GALLBLADDER AND BILE DUCTS: The gallbladder is surgically absent. Common bile duct dilatation. SPLEEN: No acute splenic injury or perisplenic fluid collection. PANCREAS: No acute pancreatic injury or peripancreatic fluid collection. ADRENAL GLANDS: Stable bilateral adrenal gland nodules consistent with benign adenomas. KIDNEYS, URETERS AND BLADDER: No renal lesions  or hydronephrosis. No perinephric hematoma. No stones in the kidneys or ureters. No perinephric or periureteral stranding. The bladder is unremarkable. GI AND BOWEL: The stomach, duodenum, small bowel, and colon are grossly normal without oral contrast. No inflammatory changes, mass lesions, or obstructive findings. The terminal ileum is normal. Colonic diverticulosis again noted. REPRODUCTIVE ORGANS: The uterus and ovaries are surgically absent. PERITONEUM AND RETROPERITONEUM: No ascites. No free air. No mesenteric or retroperitoneal mass, adenopathy, or hematoma. VASCULATURE: Advanced atherosclerotic calcification involving the aorta and Tiago Humphrey vessels but no aneurysm. ABDOMINAL AND PELVIS LYMPH NODES: No lymphadenopathy. BONES AND SOFT TISSUES: The bony thorax is intact. No acute sternal, rib, or thoracic vertebral body fractures. No chest wall contusion or breast mass. Superior and inferior pubic rami fractures on the right side. Both hips are normally located. No hip fractures. The pubic symphysis and SI joints are intact. The lumbar vertebral bodies are intact. Right-sided extraperitoneal pelvic hematoma surrounding the bladder, findings secondary to right-sided pubic rami fractures. IMPRESSION: 1. Progressive medial left upper lobe soft tissue density most consistent with progressive radiation fibrosis; recurrent tumor cannot be excluded. Recommend short-interval chest CT follow-up or PET/CT and oncologic evaluation as indicated. 2. Stable 7 mm left lower lobe pulmonary nodule. 3. Progressive indeterminate nodularity along the adjacent major fissure. Recommend short-interval chest CT follow-up to assess for stability. 4. Right-sided extraperitoneal pelvic hematoma surrounding the bladder, secondary to right-sided pubic rami fractures. Electronically signed by: Maude Stammer MD 11/06/2023 07:42 PM EST RP Workstation: [id number]   DG Pelvis Portable Result Date: 11/06/2023 CLINICAL DATA:  Clemens, pain EXAM:  PORTABLE PELVIS  1-2 VIEWS COMPARISON:  12/20/2022 FINDINGS: Supine frontal view of the pelvis was obtained, the patient is slightly rotated toward the left. No acute displaced fracture, subluxation, or dislocation. Joint spaces are relatively well preserved. Prominent atherosclerosis of the aorta and its branches. IMPRESSION: 1. No acute displaced fracture. Electronically Signed   By: Ozell Daring M.D.   On: 11/06/2023 17:22   DG Chest Port 1 View Result Date: 11/06/2023 CLINICAL DATA:  Clemens, right shoulder pain EXAM: PORTABLE CHEST 1 VIEW COMPARISON:  09/06/2022 FINDINGS: Frontal view of the chest was obtained, excluding the right costophrenic angle by collimation. The cardiac silhouette is unremarkable. Increased density in the left suprahilar region compatible with patient's known history of lung cancer, may reflect post therapeutic change given findings on recent CT. Chronic consolidation within the periphery of the superior segment of the left lower lobe again noted and stable. No new airspace disease, effusion, or pneumothorax. There is a displaced impacted fracture through the right humeral neck, incompletely characterized on this exam due to collimation. There are acute displaced right lateral fifth through eighth rib fractures as well. Prior healed left rib fractures are incidentally noted. IMPRESSION: 1. Acute fractures involving the right humeral neck and right lateral fifth through eighth ribs. 2. Chronic left suprahilar density unchanged since recent CT, compatible with known history of lung cancer. This could reflect post therapeutic changes in this region and continued follow-up is recommended. 3. Chronic scarring within the left lower lobe. Electronically Signed   By: Ozell Daring M.D.   On: 11/06/2023 17:21     Assessment and Plan: 1.Afib with RVR - new diagnosis this admission is setting of mechanical fall, multiple traumatic injuries - Onset of afib around 130AM this morning, she was  asymptomatic. Soft bp's, primary team started amiodarone. Converted to SR around 430AM. Remains in SR.   - with upcoming ortho surgery would continue amio gtt today and in perioperative period. Her surgery is planned for tomorrow afternoon.  - with pelvic hematoma, short isolated episode, and surgery tomorrow hold on anticoagulation - afib may have been provoked by multiple traumas. Would plan on outpatient monitor to be arranged at outpatient f/u. I think most likely would d/c amiodarone after surgery, I think would be reasonable to hold on anticoagulation unless significant recurrences noted in postop period or with outpatient monitor. This may have been an isolated event in setting of severe systemic stress. Also not great candidate for long term amio with her lung disease.    2. Chronic HFpEF - 09/2022 echo: LVEF 55-60%, no WMAs, indet diasotlic, normal RV, mild pulm HTN PASP 39, mild LAE, mild MR, mild AS mean grad 11 AVA VTI 1.7 CXR no acute lung process. BNP pending - had received some IV lasix  by primary team earlier in admission, now on oral lasix  20mg  daily.  - appears euvolemic, continue oral lasix .   3. Chronic respiratory failure/COPD - on home O2 1-2L - sats 94% on 2L [place] which is her baseline O2  4. Preoperative evaluation - plans for humeral fracture repair tomorrow - she has not active cardiac conditions that would be contraindication at this time - appears euvolemic, her transient afib is controlled - chronic O2 at home, reports typically can walk up a flight of stairs but some SOB during. METs are limited by her chronic respiratory lung disease/COPD. - she is at increased but not prohibitive risk for surgery, risk is based primarily on her advanced age and chronic comorbidities which are currently stable. -  she does have a repeat echo ordered more so for her afib as opposed to surgical risk stratification, will f/u results.      For questions or updates, please contact  [facility] Please consult [email or web address] for contact info under      Signed, Alvan Carrier, MD  11/10/2023 10:23 AM

## 2023-11-11 ENCOUNTER — Encounter (HOSPITAL_COMMUNITY): Payer: Self-pay

## 2023-11-11 ENCOUNTER — Encounter (HOSPITAL_COMMUNITY)
Admission: EM | Disposition: A | Discharge status: Skilled Nursing Facility | Payer: Self-pay | Source: Home / Self Care | Attending: Internal Medicine

## 2023-11-11 DIAGNOSIS — S42411A Displaced simple supracondylar fracture without intercondylar fracture of right humerus, initial encounter for closed fracture: Secondary | ICD-10-CM | POA: Diagnosis not present

## 2023-11-11 DIAGNOSIS — W1830XA Fall on same level, unspecified, initial encounter: Principal | ICD-10-CM

## 2023-11-11 DIAGNOSIS — Z01810 Encounter for preprocedural cardiovascular examination: Secondary | ICD-10-CM | POA: Diagnosis not present

## 2023-11-11 DIAGNOSIS — I5032 Chronic diastolic (congestive) heart failure: Secondary | ICD-10-CM | POA: Diagnosis not present

## 2023-11-11 DIAGNOSIS — I4891 Unspecified atrial fibrillation: Secondary | ICD-10-CM | POA: Diagnosis not present

## 2023-11-11 LAB — BLOOD CULTURE ID PANEL (REFLEXED) - BCID2

## 2023-11-11 LAB — TYPE AND SCREEN
ABO/RH(D): A POS
Antibody Screen: NEGATIVE

## 2023-11-11 LAB — CBC
HCT: 28.6 % — ABNORMAL LOW (ref 36.0–46.0)
Hemoglobin: 9.5 g/dL — ABNORMAL LOW (ref 12.0–15.0)
MCH: 30 pg (ref 26.0–34.0)
MCHC: 33.2 g/dL (ref 30.0–36.0)
MCV: 90.2 fL (ref 80.0–100.0)
Platelets: 244 K/uL (ref 150–400)
RBC: 3.17 MIL/uL — ABNORMAL LOW (ref 3.87–5.11)
RDW: 13.2 % (ref 11.5–15.5)
WBC: 8.5 K/uL (ref 4.0–10.5)
nRBC: 0 % (ref 0.0–0.2)

## 2023-11-11 LAB — BASIC METABOLIC PANEL WITH GFR
Anion gap: 12 (ref 5–15)
BUN: 44 mg/dL — ABNORMAL HIGH (ref 8–23)
CO2: 24 mmol/L (ref 22–32)
Calcium: 7.8 mg/dL — ABNORMAL LOW (ref 8.9–10.3)
Chloride: 94 mmol/L — ABNORMAL LOW (ref 98–111)
Creatinine, Ser: 1.29 mg/dL — ABNORMAL HIGH (ref 0.44–1.00)
GFR, Estimated: 42 mL/min — ABNORMAL LOW (ref >60)
Glucose, Bld: 118 mg/dL — ABNORMAL HIGH (ref 70–99)
Potassium: 4 mmol/L (ref 3.5–5.1)
Sodium: 130 mmol/L — ABNORMAL LOW (ref 135–145)

## 2023-11-11 SURGERY — ARTHROPLASTY, SHOULDER, TOTAL, REVERSE
Anesthesia: Choice | Site: Shoulder | Laterality: Right

## 2023-11-11 MED ORDER — ACETAMINOPHEN 650 MG RE SUPP: 650.0000 mg | Freq: Three times a day (TID) | RECTAL | Status: DC

## 2023-11-11 MED ORDER — TRANEXAMIC ACID-NACL 1000-0.7 MG/100ML-% IV SOLN: 1000.0000 mg | INTRAVENOUS | Status: DC

## 2023-11-11 MED ORDER — SODIUM CHLORIDE 0.9 % IV SOLN
1.0000 g | INTRAVENOUS | Status: DC
  Filled 2023-11-11: qty 10

## 2023-11-11 MED ORDER — ACETAMINOPHEN 500 MG PO TABS: 1000.0000 mg | ORAL_TABLET | Freq: Once | ORAL | Status: DC

## 2023-11-11 MED ORDER — CEFAZOLIN SODIUM-DEXTROSE 2-4 GM/100ML-% IV SOLN: 2.0000 g | INTRAVENOUS | Status: DC

## 2023-11-11 MED ORDER — FAMOTIDINE 20 MG PO TABS
20.0000 mg | ORAL_TABLET | Freq: Every day | ORAL | Status: DC
  Administered 2023-11-11 – 2023-11-22 (×11): 20 mg via ORAL
  Filled 2023-11-11 (×11): qty 1

## 2023-11-11 MED ORDER — PENTOXIFYLLINE ER 400 MG PO TBCR
400.0000 mg | EXTENDED_RELEASE_TABLET | Freq: Two times a day (BID) | ORAL | Status: DC
  Administered 2023-11-11 – 2023-11-22 (×22): 400 mg via ORAL
  Filled 2023-11-11 (×25): qty 1

## 2023-11-11 MED ORDER — CEFTRIAXONE SODIUM 2 G IJ SOLR
2.0000 g | INTRAMUSCULAR | Status: DC
  Administered 2023-11-11 – 2023-11-12 (×2): 2 g via INTRAVENOUS
  Filled 2023-11-11 (×2): qty 20

## 2023-11-11 MED ORDER — OXYCODONE HCL 5 MG PO TABS
5.0000 mg | ORAL_TABLET | ORAL | Status: DC | PRN
Start: 2023-11-11 — End: 2023-11-14
  Administered 2023-11-11: 5 mg via ORAL
  Filled 2023-11-11 (×2): qty 1

## 2023-11-11 MED ORDER — ACETAMINOPHEN 500 MG PO TABS
1000.0000 mg | ORAL_TABLET | Freq: Three times a day (TID) | ORAL | Status: DC
  Administered 2023-11-11 – 2023-11-22 (×31): 1000 mg via ORAL
  Filled 2023-11-11 (×32): qty 2

## 2023-11-11 MED ORDER — UMECLIDINIUM BROMIDE 62.5 MCG/ACT IN AEPB
1.0000 | INHALATION_SPRAY | Freq: Every day | RESPIRATORY_TRACT | Status: DC
  Administered 2023-11-12 – 2023-11-17 (×6): 1 via RESPIRATORY_TRACT
  Filled 2023-11-11 (×2): qty 7

## 2023-11-11 MED ORDER — GABAPENTIN 300 MG PO CAPS: 300.0000 mg | ORAL_CAPSULE | Freq: Once | ORAL | Status: DC

## 2023-11-11 MED ORDER — MORPHINE SULFATE (PF) 4 MG/ML IV SOLN
4.0000 mg | INTRAVENOUS | Status: DC | PRN
  Administered 2023-11-12 – 2023-11-19 (×18): 4 mg via INTRAVENOUS
  Filled 2023-11-11 (×19): qty 1

## 2023-11-11 MED ORDER — HEPARIN SODIUM (PORCINE) 5000 UNIT/ML IJ SOLN
5000.0000 [IU] | Freq: Two times a day (BID) | INTRAMUSCULAR | Status: DC
  Administered 2023-11-11 – 2023-11-22 (×21): 5000 [IU] via SUBCUTANEOUS
  Filled 2023-11-11 (×21): qty 1

## 2023-11-11 MED ORDER — DOCUSATE SODIUM 100 MG PO CAPS: 100.0000 mg | ORAL_CAPSULE | Freq: Two times a day (BID) | ORAL | Status: DC | PRN

## 2023-11-11 MED ORDER — PROCHLORPERAZINE EDISYLATE 10 MG/2ML IJ SOLN
10.0000 mg | Freq: Four times a day (QID) | INTRAMUSCULAR | Status: DC | PRN
Start: 2023-11-11 — End: 2023-11-22

## 2023-11-11 MED ORDER — ARFORMOTEROL TARTRATE 15 MCG/2ML IN NEBU
15.0000 ug | INHALATION_SOLUTION | Freq: Two times a day (BID) | RESPIRATORY_TRACT | Status: DC
  Administered 2023-11-11 – 2023-11-22 (×19): 15 ug via RESPIRATORY_TRACT
  Filled 2023-11-11 (×22): qty 2

## 2023-11-11 NOTE — Progress Notes (Addendum)
 [facility] Progress Note Patient: Teresa Jensen FMW:[id number] DOB: 12-26-1945  DOA: 11/06/2023 DOS: the patient was seen and examined on 11/11/2023  Brief Hospital Course: Teresa Jensen is a 78 y.o. 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  Patient sustained right pubic ramus fracture with hematoma in pelvis, right humeral head fracture.  Trauma surgery cleared her, orthopedic consulted, admitted to TRH service.   Assessment and Plan: Status post fall, multiple injuries Right humerus fracture Closed pubic rami fracture Cleared by trauma service. Orthopedic surgery planning on ORIF right humerus. Surgery now postponed due to bacteremia.  Klebsiella bacteremia. Etiology not clear. Patient developed temperature a few days ago blood culture performed. Urine culture still pending. Chest x-ray unremarkable. CT abdomen and pelvis negative for any acute abnormality done on admission. At present will initiate IV ceftriaxone . Repeat cultures on Wednesday. Cultures remaining negative for 48 hours patient can be safely considered for surgery.  But currently surgery has to be postponed due to risk of infection.   New onset Afib with RVR- Early AM she had HR> 140, due to he borderline low BP, Amiodarone drip initiated. She is sinus this AM. Cardiology consult appreciated, continue Amio drip till surgery, no anticoagulation until Afib recurs, outpatient holter monitor per cardiology. Continue telemetry.    Chronic hypoxic respiratory failure - Hypoxia, oxygen requirement  worsened in the setting of trauma, pain.  She does have CHF. Continue supplemental oxygen, wean to home 1-2L O2. CT chest ruled out rib fractures. Continue bronchodilators, Lasix  home dose.   Chronic heart failure with preserved ejection fraction- Patient does have basilar rhonchi, rales, increased supplemental oxygen requirement. S/p IV lasix . decreased oral Lasix  to 20 mg daily. Follow daily renal function, electrolytes, weight, strict input and output.   COPD History of pulmonary fibrosis No exacerbation. Home o2 1-2L. Continue bronchodilators as needed.   Essential hypertension: BP lower side.  Caution with antihypertensive regimen.   GERD: Continue Protonix .   Hypothyroidism: Continue home Synthroid .   CKD stage III A- Kidney function stable. Avoid nephrotoxic drugs, hypertension, contrast. Monitor daily renal function.   Obesity class I- Body mass index is 34.62 kg/m.  Diet, exercise and weight reduction advised. Placing the patient at high risk of poor outcome.   Subjective: No nausea no vomiting.  Reports ongoing pain.  No fever no chills.  Physical Exam: Basal crackles. S1-S2 present Bowel sound present.  Nontender. No edema.  Data Reviewed: I have Reviewed nursing notes, Vitals, and Lab results. Since last encounter, pertinent lab results CBC BMP   . I have ordered test including CBC and BMP  .  Discussed with orthopedics. Disposition: Status is: Inpatient Remains inpatient appropriate because: Monitor for improvement in surgical status and surgery  SCDs Start: 11/07/23 0943   Family Communication: Family at bedside.  Also discussed with son on the phone. Level of care: Progressive Vitals:   11/11/23 0500 11/11/23 0900 11/11/23 1236 11/11/23 1727  BP: 116/74 (!) 113/49 (!) 125/57 (!) 123/40  Pulse: 63 71 79 64  Resp: 16 18 17 16   Temp: 97.8 F (36.6 C) (!) 97.5 F (36.4 C) 98.3 F (36.8 C)  98 F (36.7 C)  TempSrc: Oral Oral Oral Oral  SpO2:  100% 98% 97% 99%  Weight:      Height:         Author: Yetta Blanch, MD 11/11/2023 6:08 PM  Please look on [email or web address] to find out who is on call.

## 2023-11-11 NOTE — Plan of Care (Signed)

## 2023-11-11 NOTE — Progress Notes (Signed)
 PHARMACY - PHYSICIAN COMMUNICATION CRITICAL VALUE ALERT - BLOOD CULTURE IDENTIFICATION (BCID)  Teresa Jensen is an 78 y.o. female who presented to [facility] on 11/06/2023 with a chief complaint of a fall.   Assessment:  78 year old female admitted with a fall on 11/5. Developed a fever on 11/8. Now with klebsiella pneumoniae in 1/2 blood cultures. Has had a external catheter since 11/6.   Name of physician (or Provider) Contacted: Tobie   Current antibiotics: ceftriaxone   Changes to prescribed antibiotics recommended:  Increase ceftriaxone  to 2 gm daily   Results for orders placed or performed during the hospital encounter of 11/06/23  Blood Culture ID Panel (Reflexed) (Collected: 11/10/2023  7:07 PM)  Result Value Ref Range   Enterococcus faecalis NOT DETECTED NOT DETECTED   Enterococcus Faecium NOT DETECTED NOT DETECTED   Listeria monocytogenes NOT DETECTED NOT DETECTED   Staphylococcus species NOT DETECTED NOT DETECTED   Staphylococcus aureus (BCID) NOT DETECTED NOT DETECTED   Staphylococcus epidermidis NOT DETECTED NOT DETECTED   Staphylococcus lugdunensis NOT DETECTED NOT DETECTED   Streptococcus species NOT DETECTED NOT DETECTED   Streptococcus agalactiae NOT DETECTED NOT DETECTED   Streptococcus pneumoniae NOT DETECTED NOT DETECTED   Streptococcus pyogenes NOT DETECTED NOT DETECTED   A.calcoaceticus-baumannii NOT DETECTED NOT DETECTED   Bacteroides fragilis NOT DETECTED NOT DETECTED   Enterobacterales DETECTED (A) NOT DETECTED   Enterobacter cloacae complex NOT DETECTED NOT DETECTED   Escherichia coli NOT DETECTED NOT DETECTED   Klebsiella aerogenes NOT DETECTED NOT DETECTED   Klebsiella oxytoca NOT DETECTED NOT DETECTED   Klebsiella pneumoniae DETECTED (A) NOT DETECTED   Proteus species NOT DETECTED NOT DETECTED   Salmonella species NOT DETECTED NOT DETECTED   Serratia marcescens NOT DETECTED NOT DETECTED   Haemophilus influenzae NOT DETECTED NOT DETECTED    Neisseria meningitidis NOT DETECTED NOT DETECTED   Pseudomonas aeruginosa NOT DETECTED NOT DETECTED   Stenotrophomonas maltophilia NOT DETECTED NOT DETECTED   Candida albicans NOT DETECTED NOT DETECTED   Candida auris NOT DETECTED NOT DETECTED   Candida glabrata NOT DETECTED NOT DETECTED   Candida krusei NOT DETECTED NOT DETECTED   Candida parapsilosis NOT DETECTED NOT DETECTED   Candida tropicalis NOT DETECTED NOT DETECTED   Cryptococcus neoformans/gattii NOT DETECTED NOT DETECTED   CTX-M ESBL NOT DETECTED NOT DETECTED   Carbapenem resistance IMP NOT DETECTED NOT DETECTED   Carbapenem resistance KPC NOT DETECTED NOT DETECTED   Carbapenem resistance NDM NOT DETECTED NOT DETECTED   Carbapenem resist OXA 48 LIKE NOT DETECTED NOT DETECTED   Carbapenem resistance VIM NOT DETECTED NOT DETECTED    Damien Quiet, PharmD, BCPS, BCIDP Infectious Diseases Clinical Pharmacist Phone: [phone] 11/11/2023  10:06 AM

## 2023-11-11 NOTE — TOC Initial Note (Signed)
 Transition of Care [facility]) - Initial/Assessment Note    Patient Details  Name: Teresa Jensen MRN: [id number] Date of Birth: [birth year]  Transition of Care [facility]) CM/SW Contact:    Montie LOISE Louder, LCSW Phone Number: 11/11/2023, 3:41 PM  Clinical Narrative:           CSW met with patient , her spouse and son. CSW introduced self and explained role. CSW discuss with family recommendation for short term rehab. Patient and family is agreeable to rehab. CSW explained the SNF process. Answered all questions. Preferred SNF is [facility] or [facility], close to their home. CSW given permission to fax out to  other SNFs.   TOC will provide bed offers once available.   Montie Louder, MSW, LCSW Clinical Social Worker           Expected Discharge Plan: Skilled Nursing Facility Barriers to Discharge: Continued Medical Work up   Patient Goals and CMS Choice            Expected Discharge Plan and Services In-house Referral: Clinical Social Work     Living arrangements for the past 2 months: Single Family Home                                      Prior Living Arrangements/Services Living arrangements for the past 2 months: Single Family Home Lives with:: Self, Spouse Patient language and need for interpreter reviewed:: No        Need for Family Participation in Patient Care: Yes (Comment) Care giver support system in place?: Yes (comment)   Criminal Activity/Legal Involvement Pertinent to Current Situation/Hospitalization: No - Comment as needed  Activities of Daily Living      Permission Sought/Granted Permission sought to share information with : Family Supports Permission granted to share information with : Yes, Verbal Permission Granted  Share Information with NAME: Ariahna Smiddy  Permission granted to share info w AGENCY: SNFs  Permission granted to share info w Relationship: spouse  Permission granted to share info w Contact Information:  [phone]  Emotional Assessment Appearance:: Appears stated age Attitude/Demeanor/Rapport: Engaged Affect (typically observed): Accepting, Appropriate, Pleasant Orientation: : Oriented to Self, Oriented to Place, Oriented to  Time, Oriented to Situation Alcohol / Substance Use: Not Applicable Psych Involvement: No (comment)  Admission diagnosis:  Multiple rib fractures [S22.49XA] Closed fracture of ramus of right pubis, initial encounter (HCC) [S32.591A] Closed fracture of multiple ribs of both sides, initial encounter [S22.43XA] Other closed displaced fracture of proximal end of right humerus, initial encounter [S42.291A] Ground-level fall [W18.30XA] Patient Active Problem List   Diagnosis Date Noted   Closed fracture of multiple pubic rami (HCC) 11/07/2023   CKD (chronic kidney disease), stage III (HCC) 11/07/2023   Right supracondylar humerus fracture 11/07/2023   Multiple rib fractures 11/06/2023   Myalgia 08/13/2023   Skin breakdown 06/21/2023   Candidal intertrigo 03/21/2023   Urinary urgency 11/21/2022   Acute on chronic diastolic (congestive) heart failure (HCC) 09/06/2022   Acute on chronic respiratory failure with hypoxia (HCC) 09/06/2022   Allergic reaction to contrast dye 07/17/2022   Intermittent claudication 07/04/2022   Lipodermatosclerosis of both lower extremities 02/22/2022   Solitary pulmonary nodule 11/27/2021   Chronic right shoulder pain 08/04/2021   Lower extremity pain 08/04/2021   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   Morbid obesity (HCC) 05/01/2021   Foot callus 09/01/2020   Rash and nonspecific skin  eruption 07/07/2020   History of colonic polyps 11/01/2019   Bilateral carpal tunnel syndrome 08/16/2019   Chronic heart failure with preserved ejection fraction (HFpEF) (HCC) 11/12/2018   DOE (dyspnea on exertion) 11/12/2018   Osteoporosis 12/16/2017   Low vitamin B12 level 01/13/2017   Hypokalemia 01/12/2017   Essential  hypertension 01/08/2017   Closed fracture of metatarsal bone 09/28/2016   Prediabetes 10/17/2015   Pain of right lower extremity 05/20/2015   Medicare annual wellness visit, subsequent 10/12/2014   Occipital neuralgia 10/12/2014   Vitamin D  deficiency 10/12/2014   Erythema of lower extremity 09/08/2013   COPD GOLD 2  06/05/2010   Hypothyroidism 09/29/2009   Situational anxiety 10/01/2008   COPD 10/01/2008   GERD 10/01/2008   Osteoarthritis 10/01/2008   PCP:  Gretta Comer POUR, NP Pharmacy:   [facility] [phone] - [address] [place] - [name] [address] Phone: [phone] Fax: [phone]  [facility] - [place], [name] - [address] [name] [address] Phone: [phone] Fax: [phone]  [facility] - [place], [place] - [address] [place] [address] Phone: [phone] Fax: [phone]     Social Drivers of Health (SDOH) Social History: SDOH Screenings   Food Insecurity: Patient Declined (11/08/2023)  Housing: Unknown (11/08/2023)  Transportation Needs: Patient Declined (11/08/2023)  Utilities: Patient Declined (11/08/2023)  Alcohol Screen: Low Risk  (05/07/2022)  Depression (PHQ2-9): Low Risk  (08/13/2023)  Financial Resource Strain: Patient Declined (05/01/2023)  Physical Activity: Sufficiently Active (05/01/2023)  Social Connections: Unknown (11/08/2023)  Stress: No Stress Concern Present (05/01/2023)  Tobacco Use: Medium Risk (11/06/2023)  Health Literacy: Adequate Health Literacy (05/08/2023)   SDOH Interventions:     Readmission Risk Interventions     No data to display

## 2023-11-11 NOTE — Evaluation (Signed)
 Occupational Therapy Evaluation Patient Details Name: Teresa Jensen MRN: [id number] DOB: 04-Jun-1945 Today's Date: 11/11/2023   History of Present Illness   Pt is a 78 y.o. female who presented 11/06/23 after fall. Xrays of the right shoulder demonstrate a displaced proximal humerus fracture. CT pelvis demonstrates right sided pubic rami fractures. Plan for right reverse TSA  (delayed) and non-op management of RLE. 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     Clinical Impressions PTA patient independent with ADLs, light IADLs.  Admitted for above and presents with problem list below, including pain, decreased functional use of dominant R UE, impaired balance, decreased activity tolerance and generalized weakness.  Pt fearful of falling throughout session, preference to L UE support on bed rail sitting EOB.  Needing mod-max assist for bed mobility, setup to total assist for Adls.  Deferred OOB today due to pain.  Encouraged increased support with pillows to R UE, reports more comfortable with pillow behind arm and under elbow.  Based on performance today, believe patient will best benefit from continued OT services acutely and after dc at inpatient setting with <3hrs/day to optimize independence, safety with ADLs and mobility.      If plan is discharge home, recommend the following:   Two people to help with walking and/or transfers;A lot of help with bathing/dressing/bathroom;Assistance with cooking/housework;Assist for transportation;Help with stairs or ramp for entrance     Functional Status Assessment   Patient has had a recent decline in their functional status and demonstrates the ability to make significant improvements  in function in a reasonable and predictable amount of time.     Equipment Recommendations   BSC/3in1     Recommendations for Other Services         Precautions/Restrictions   Precautions Precautions: Fall Recall of Precautions/Restrictions: Impaired Required Braces or Orthoses: Sling Restrictions Weight Bearing Restrictions Per Provider Order: Yes RUE Weight Bearing Per Provider Order: Non weight bearing RLE Weight Bearing Per Provider Order: Weight bearing as tolerated     Mobility Bed Mobility Overal bed mobility: Needs Assistance Bed Mobility: Supine to Sit, Sit to Supine     Supine to sit: HOB elevated, Used rails, Max assist Sit to supine: Mod assist, HOB elevated, Used rails   General bed mobility comments: max assist for trunk support to elevate and scoot hips to EOB, pt able to manage LEs towards EOB. returned to supine with LE support. +2 to pull up inbed    Transfers                   General transfer comment: deferred      Balance Overall balance assessment: Needs assistance Sitting-balance support: Single extremity supported, Feet supported Sitting balance-Leahy Scale: Fair Sitting balance - Comments: preference to LUE support on rail. L lateral lean without UE support                                   ADL either performed or assessed with clinical judgement   ADL Overall ADL's : Needs assistance/impaired     Grooming: Minimal assistance;Sitting           Upper Body Dressing : Moderate assistance;Sitting   Lower Body Dressing: Total assistance;Sitting/lateral leans;Bed  level     Toilet Transfer Details (indicate cue type and reason): deferred         Functional mobility during ADLs: Moderate assistance       Vision Baseline Vision/History: 6 Macular Degeneration Ability to See in Adequate Light: 1 Impaired Patient Visual Report: No change from baseline Additional Comments: poor vision at baseline      Perception         Praxis         Pertinent Vitals/Pain Pain Assessment Pain Assessment: Faces Faces Pain Scale: Hurts whole lot Pain Location: R shoulder/upper arm and R hip Pain Descriptors / Indicators: Grimacing, Guarding, Discomfort, Aching Pain Intervention(s): Limited activity within patient's tolerance, Monitored during session, Premedicated before session, Repositioned     Extremity/Trunk Assessment Upper Extremity Assessment Upper Extremity Assessment: Right hand dominant;RUE deficits/detail;Generalized weakness RUE Deficits / Details: R humerus fx, in sling. plans for surgery later this week RUE: Unable to fully assess due to immobilization RUE Coordination: decreased gross motor;decreased fine motor   Lower Extremity Assessment Lower Extremity Assessment: Defer to PT evaluation   Cervical / Trunk Assessment Cervical / Trunk Assessment: Other exceptions Cervical / Trunk Exceptions: increased body habitus   Communication Communication Communication: No apparent difficulties   Cognition Arousal: Alert Behavior During Therapy: WFL for tasks assessed/performed, Anxious Cognition: No apparent impairments                               Following commands: Intact       Cueing  General Comments   Cueing Techniques: Verbal cues  pt on 2L [place], VSS   Exercises     Shoulder Instructions      Home Living Family/patient expects to be discharged to:: Private residence Living Arrangements: Spouse/significant other Available Help at Discharge: Family;Available 24 hours/day Type of Home: House Home Access: Level entry     Home Layout: Two level;Able to live on main level with bedroom/bathroom;Full bath on main level     Bathroom Shower/Tub: Producer, Television/film/video: Standard     Home Equipment: Hand held shower head;Shower seat - built in   Additional Comments: Pt has a portable oxygen tank and carrier at home      Prior  Functioning/Environment Prior Level of Function : Independent/Modified Independent             Mobility Comments: Ambulates without AD. 1 fall leading to admit, when trying to watch the leaves been blown outside and tripped over her O2 tank. ADLs Comments: Indep with ADLs. Manages her own meds. Doesn't drive.    OT Problem List: Decreased strength;Decreased activity tolerance;Impaired balance (sitting and/or standing);Decreased safety awareness;Decreased knowledge of use of DME or AE;Decreased knowledge of precautions;Pain;Impaired UE functional use;Obesity   OT Treatment/Interventions: Self-care/ADL training;Therapeutic exercise;DME and/or AE instruction;Therapeutic activities;Patient/family education;Balance training;Energy conservation      OT Goals(Current goals can be found in the care plan section)   Acute Rehab OT Goals Patient Stated Goal: less pain OT Goal Formulation: With patient Time For Goal Achievement: 11/25/23 Potential to Achieve Goals: Good   OT Frequency:  Min 2X/week    Co-evaluation              AM-PAC OT 6 Clicks Daily Activity     Outcome Measure Help from another person eating meals?: A Little Help from another person taking care of personal grooming?: A Little Help from another person toileting, which includes using toliet,  bedpan, or urinal?: Total Help from another person bathing (including washing, rinsing, drying)?: A Lot Help from another person to put on and taking off regular upper body clothing?: A Lot Help from another person to put on and taking off regular lower body clothing?: Total 6 Click Score: 12   End of Session Equipment Utilized During Treatment: Oxygen;Other (comment) (sling) Nurse Communication: Mobility status;Precautions  Activity Tolerance: Patient limited by fatigue;Patient limited by pain Patient left: in bed;with call bell/phone within reach;with bed alarm set;with family/visitor present  OT Visit Diagnosis:  Other abnormalities of gait and mobility (R26.89);Muscle weakness (generalized) (M62.81);Pain;History of falling (Z91.81) Pain - Right/Left: Right Pain - part of body: Shoulder;Arm                Time: 1249-1317 OT Time Calculation (min): 28 min Charges:  OT General Charges $OT Visit: 1 Visit OT Evaluation $OT Eval Moderate Complexity: 1 Mod OT Treatments $Self Care/Home Management : 8-22 mins  Etta NOVAK, OT Acute Rehabilitation Services Office [phone] Secure Chat Preferred    Etta GORMAN Hope 11/11/2023, 2:13 PM

## 2023-11-11 NOTE — Hospital Course (Addendum)
 Teresa Jensen is a 78 y.o. 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  Patient sustained right pubic ramus fracture with hematoma in pelvis, right humeral head fracture.  Trauma surgery cleared her, orthopedic consulted, admitted to TRH service.  11/13: Hemodynamically stable, continue to have pain, repeat blood cultures negative in 1 day, likely will have her surgery on Monday per orthopedic surgery.  11/14: Hemodynamically stable, stating that morphine and Xanax  together help with pain.  Surgery now planned for Monday.  Repeat blood cultures remain negative.  11/15: Hemodynamically stable, had a BM.  Pain bearable, surgery on Monday.  Repeat blood cultures remain negative 11/18 right reverse total shoulder arthroplasty, right open reduction internal fixation of tuberosities.

## 2023-11-11 NOTE — Progress Notes (Addendum)
 Rounding Note   Patient Name: Teresa Jensen Date of Encounter: 11/11/2023  [facility] Cardiologist: Lonni Hanson, MD   Subjective No acute overnight events.  Patient's only complaint is pain in right arm from humerus fracture.  No chest pain. No shortness of breath. She is on 2L of O2 via nasal cannula (she is normally on 1-2 L at home).  Maintaining sinus rhythm.   Blood cultures last night came back positive for Enterobacterales and Klebsiella pneumoniae.  Initial plan was for surgery for repair of right humerus fracture today but this has been cancelled due to bacteremia.  Scheduled Meds:  acetaminophen   1,000 mg Oral TID   Or   acetaminophen   650 mg Rectal TID   arformoterol  15 mcg Nebulization BID   And   umeclidinium bromide  1 puff Inhalation Daily   atorvastatin   10 mg Oral Daily   famotidine   20 mg Oral Daily   heparin injection (subcutaneous)  5,000 Units Subcutaneous Q12H   levothyroxine   75 mcg Oral Q0600   methocarbamol  500 mg Oral TID   pantoprazole   40 mg Oral QHS   pentoxifylline   400 mg Oral BID   spironolactone   12.5 mg Oral Daily   Continuous Infusions:  cefTRIAXone  (ROCEPHIN )  IV 2 g (11/11/23 1333)   PRN Meds: albuterol , docusate sodium , morphine injection, ondansetron  **OR** ondansetron  (ZOFRAN ) IV, oxyCODONE , prochlorperazine   Vital Signs  Vitals:   11/11/23 0500 11/11/23 0900 11/11/23 1236 11/11/23 1727  BP: 116/74 (!) 113/49 (!) 125/57 (!) 123/40  Pulse: 63 71 79 64  Resp: 16 18 17 16   Temp: 97.8 F (36.6 C) (!) 97.5 F (36.4 C) 98.3 F (36.8 C) 98 F (36.7 C)  TempSrc: Oral Oral Oral Oral  SpO2: 100% 98% 97% 99%  Weight:      Height:        Intake/Output Summary (Last 24 hours) at 11/11/2023 1920 Last data filed at 11/11/2023 1727 Gross per 24 hour  Intake 480 ml  Output 400 ml  Net 80 ml      11/06/2023    4:40 PM 10/22/2023    9:10 AM 10/14/2023    8:36 AM  Last 3 Weights  Weight (lbs) 160 lb 168 lb  3.2 oz 168 lb 9.6 oz  Weight (kg) 72.576 kg 76.295 kg 76.476 kg      Telemetry Normal sinus rhythm with one very short run of SVT. Rates in the 70s. - Personally Reviewed  ECG  No new ECG tracing today. - Personally Reviewed  Physical Exam  GEN: No acute distress. On 2L of O2 via nasal cannula.  Neck: No JVD. Cardiac: RRR. No murmurs, rubs, or gallops.  Respiratory: No increased work of breathing. Clear to auscultation bilaterally. MS: No lower extremity edema. Neuro:  No focal deficits.  Psych: Normal affect. Responds appropriately.   Labs High Sensitivity Troponin:  No results for input(s): TROPONINIHS in the last 720 hours.   Chemistry Recent Labs  Lab 11/06/23 1800 11/06/23 1811 11/07/23 1103 11/08/23 0255 11/09/23 0336 11/10/23 0251 11/11/23 0319  NA 139   < > 138   < > 134* 133* 130*  K 4.1   < > 5.0   < > 4.2 4.2 4.0  CL 102   < > 104   < > 95* 94* 94*  CO2 24  --  24   < > 26 24 24   GLUCOSE 140*   < > 130*   < > 99  121* 118*  BUN 30*   < > 26*   < > 40* 45* 44*  CREATININE 1.15*   < > 1.22*   < > 1.57* 1.36* 1.29*  CALCIUM  9.0  --  8.5*   < > 8.0* 8.0* 7.8*  MG  --   --  2.2  --   --   --   --   PROT 7.3  --  7.0  --   --   --   --   ALBUMIN 3.3*  --  3.2*  --   --   --   --   AST 20  --  22  --   --   --   --   ALT 18  --  18  --   --   --   --   ALKPHOS 63  --  57  --   --   --   --   BILITOT 0.5  --  0.8  --   --   --   --   GFRNONAA 49*  --  45*   < > 34* 40* 42*  ANIONGAP 13  --  10   < > 13 15 12    < > = values in this interval not displayed.    Lipids No results for input(s): CHOL, TRIG, HDL, LABVLDL, LDLCALC, CHOLHDL in the last 168 hours.  Hematology Recent Labs  Lab 11/09/23 0336 11/10/23 0251 11/11/23 0319  WBC 10.6* 10.5 8.5  RBC 3.25* 3.42* 3.17*  HGB 9.7* 10.2* 9.5*  HCT 30.2* 31.6* 28.6*  MCV 92.9 92.4 90.2  MCH 29.8 29.8 30.0  MCHC 32.1 32.3 33.2  RDW 13.4 13.2 13.2  PLT 215 235 244   Thyroid  No results for  input(s): TSH, FREET4 in the last 168 hours.  BNP Recent Labs  Lab 11/10/23 1109  BNP 159.0*    DDimer No results for input(s): DDIMER in the last 168 hours.    Cardiac Studies  Echocardiogram 11/10/2023: Impressions: 1. Left ventricular ejection fraction, by estimation, is 60 to 65%. The  left ventricle has normal function. The left ventricle has no regional  wall motion abnormalities. Left ventricular diastolic parameters are  indeterminate.   2. Right ventricular systolic function is normal. The right ventricular  size is normal.   3. The mitral valve is normal in structure. No evidence of mitral valve  regurgitation. No evidence of mitral stenosis.   4. Technically difficult apical windows, Dopplers of the LVOT and AV were  not obtained. Not able to assess gradients. Morphologically likely mild at  most moderate aortic stenosis. . The aortic valve has an indeterminant  number of cusps. There is mild  calcification of the aortic valve. There is mild thickening of the aortic  valve. Aortic valve regurgitation is mild.   5. The inferior vena cava is normal in size with greater than 50%  respiratory variability, suggesting right atrial pressure of 3 mmHg.    Patient Profile   78 y.o. female with a history of chronic HFpEF, PAD, COPD with chronic hypoxic respiratory failure, pulmonary hypertension, hypertension, hyperlipidemia, hypothyroidism, anemia, bilateral carpal tunnel syndrome, lung cancer, and obesity who was admitted on 11/06/2023 after a mechanical fall at home. She sustained multiple injuries including rib fracture, right humeral head fracture, and pubic ramus fracture/ hematoma. She was also noted to have a short episode of atrial fibrillation during admission. Cardiology consulted fro pre-op evaluation.  Assessment & Plan   Pre-Op Evaluation Cardiology  consulted for pre-op evaluation for humeral fracture. Per Dr. Ranae note from 11/10/2023 felt to be at  increased but not prohibitive risk for surgery based primarily on advanced age and chronic comorbidities which are stable. She has no active cardiac conditions. Echo showed normal LV function.   Initial plan was for surgery today; however, this has been cancelled due to positive blood cultures.  New Onset Atrial Fibrillation New diagnosis in setting of mechanical fall and multiple traumatic injuries. She was started on IV Amiodarone given soft BP and converted to sinus rhythm. She was only in atrial fibrillation for about 3 hours. Echo showed normal LV function. - Telemetry shows sinus rhythm with one very short run of SVT. - Currently on IV Amiodarone. May be able to stop this. Will discuss with MD. Click Here to Calculate/Change CHADS2VASc Score The patient's CHADS2-VASc score is 6, indicating a 9.7% annual risk of stroke.   CHF History: Yes HTN History: Yes Diabetes History: No Stroke History: No Vascular Disease History: Yes Not currently on anticoagulation in anticipation for surgery.  Per Dr. Alvan, it may be reasonable to hold off on anticoagulation unless she has has significant recurrent in post-op period.  Can plan for outpatient monitor to assess for recurrence/ burden as well.  Chronic HFpEF BNP mildly elevated at 159. Chest x-ray showed Echo this admission showed chronic interstitial coarsening likely related to emphysema but no acute findings.  LVEF of 60-65% with normal wall motion, normal RV function, and mild (at most moderate) aortic stenosis and mild aortic insufficiency. He received one dose of IV Lasix  and then was transitioned to PO Lasix . Net negative 522 mL. - Euvolemic on exam.  - Continue PO Lasix  20mg  daily.  - Continue Spironolactone  12.5mg  daily.  Hypertension BP soft  at times but stable. - Continue Spironolactone  12.5mg  daily. - Continue to monitor closely.  Hyperlipidemia - Continue Lipitor 10mg  daily.   Otherwise, per primary team: - Right humerus  fracture - Pubic rami fracture - Chronic hypoxic respiratory failure - COPD/ emphysema - Fever - Bacteremia - Anemia - GERD - Hypothyroidism    For questions or updates, please contact [facility] Please consult [email or web address] for contact info under    Signed, Callie E Goodrich, PA-C  11/11/2023, 10:55 AM    ADDENDUM:   Patient seen and examined with Callie E Goodrich, PA-C.  I personally taken a history, examined the patient, reviewed relevant notes,  laboratory data / imaging studies.  I performed a substantive portion of this encounter and formulated the important aspects of the plan.  I agree with the APP's note, impression, and recommendations; however, I have edited the note to reflect changes or salient points.   Patient resting in bed comfortably. Denies anginal chest pain or heart failure symptoms. No family at bedside. Had plans to undergo ORIF of the right humerus fracture; however, surgery canceled due to bacteremia  PHYSICAL EXAM: Today's Vitals   11/11/23 1236 11/11/23 1240 11/11/23 1639 11/11/23 1727  BP: (!) 125/57   (!) 123/40  Pulse: 79   64  Resp: 17   16  Temp: 98.3 F (36.8 C)   98 F (36.7 C)  TempSrc: Oral   Oral  SpO2: 97%   99%  Weight:      Height:      PainSc:  10-Worst pain ever 2     Body mass index is 34.62 kg/m.   Net IO Since Admission: -442.51 mL [11/11/23 1920]  [employer]  11/06/23 1640  Weight: 72.6 kg    Physical Exam  Constitutional: She appears chronically ill.  Age appropriate, hemodynamically stable, no acute distress.   Neck: No JVD present.  Cardiovascular: Normal rate, regular rhythm, S1 normal and S2 normal. Exam reveals no gallop and no friction rub.  No murmur heard. Pulmonary/Chest: Breath sounds normal. She has no wheezes. She has no rales. She exhibits no tenderness.  Abdominal: Soft. Bowel sounds are normal. She exhibits no distension. There is no abdominal tenderness.  Musculoskeletal:         General: No tenderness or edema.     Comments: Right upper extremity in a sling  Neurological: She is alert and oriented to person, place, and time.  Skin: Skin is warm and moist.    EKG: (personally reviewed by me) 11/10/2023: Atrial fibrillation with rapid ventricular rate, 136 bpm, nonspecific ST-T changes.  Telemetry: (personally reviewed by me) Sinus rhythm   Impression & Recommendations: :  Newly discovered atrial fibrillation Preoperative risk stratification Status post mechanical fall with multiple injuries: Right humerus fracture, closed pubic rami fracture,  Right-sided extraperitoneal pelvic hematoma surrounding the bladder,secondary to right-sided pubic rami fractures. Bacteremia Chronic HFpEF. History of COPD/emphysema/home oxygen dependence 1 to 2 L. Hypertension. GERD  Newly discovered atrial fibrillation: In the setting of mechanical fall with sustained fractures Likely precipitated by overall stress and acute illness The patient was placed on IV amiodarone drip for rate and rhythm control strategy earlier this admission According to EMR onset of A-fib was 1:30 AM 11/10/2023 and then transition back to sinus rhythm around 4:30 AM 11/10/2023 She is currently not on anticoagulation High CHA2DS2-VASc score - 6 No prior history of atrial fibrillation. Currently not on anticoagulation. If anticoagulation is considered during this hospitalization she will need to be cleared by trauma and orthopedic services to make sure its safe given her recent injuries.  Ideally, given her high CHA2DS2-VASc and the need off antiarrythmic to convert her to SR oral anticoagulation for thromboembolic prophylaxis should be considered. But risk and benefit for long term use needs to be discussed with patient, son, and care team.  Patient denies history of gastrointestinal bleeding or intracranial bleeding. No prior history of falls except the recent event.  Ideally would recommend  anticoagulation for the first 30 days given her high CHA2DS2-VASc score along with long-term monitoring for A-fib burden i.e. cardiac monitor or loop recorder. IF within the first 30 days she has recurrence of Afib than oral anticoagulation should be re-considered to reduce her thromboembolic events risk after informed consent with risk vs. Benefit ratio.  However, if she does not have A-fib within the first 30 days then holding anticoagulation and continuing monitoring would be ideal (this would provide monitoring and reduce bleeding risk). Spoke with internal medicine attending and they also favored anticoagulation to prevent thromboembolic events given her high CHA2DS2-VASc score.  Internal medicine team reached out to her son so that informed decision can be made. According to the son patient has had history of significant epistaxis needing cautery and therefore would like to hold of anticoagulation knowing her CHA2DS2-Vasc score. In addition, CT 11/06/2023 noted Right-sided extraperitoneal pelvic hematoma therefore, will hold off anticoagulation for now and continue to monitor.  Since her surgery is postponed she will be inpatient during which her telemetry can be continued to monitor for future occurences.  Otherwise, consider cardiac monitor at discharge with outpt follow up with her primary cardiologist Dr. Mady. Will update her EKG as she is back  in SR.   Preoperative risk stratification: Already provided by Dr. Alvan, please refer to EMR.  Please reach out if change in clinical status in the interim for reevaluation.  Bacteremia: Management primary team  Chronic HFpEF: History of Compensated, euvolemic. BNP slightly elevated at 159 Currently on spironolactone  12.5 mg p.o. daily Recent chest x-ray 11/10/2023: No findings for pulmonary vascular congestion.   Medical Decision:  Complexity: High - Discussion regarding oral anticoagulation in the setting of her current injuries, prior  epistaxis requiring intervention, and the role of long-term monitoring. Interdisciplinary: Yes, please send to internal medicine. Independently reviewed: Imaging results since admission, labs, EKG, telemetry Prescription drug management -recommendations stated above  Further recommendations to follow as the case evolves.   This note was created using a voice recognition software as a result there may be grammatical errors inadvertently enclosed that do not reflect the nature of this encounter. Every attempt is made to correct such errors.   Madonna Michele HAS, [facility]  A Division of [facility] [address] [place], [name] [address]  Pager: [phone] Office: [phone] 11/11/2023 7:20 PM

## 2023-11-11 NOTE — Progress Notes (Signed)
   ORTHOPAEDIC PROGRESS NOTE   SUBJECTIVE: Reports continued pain in the right shoulder. Surgery is now delayed due to blood cultures showing Gram negative rods. I had a long discussion with the patient, her husband, and her son who were all at the bedside. They state their understanding.   OBJECTIVE: PE: RUE: TTP proximal humerus. ROM not tested in setting of known fracture. She endorses axillary nerve sensation which she states is preserved and symmetric. Was able to appreciate slight deltoid motor function. Distal motor and sensory function intact. Warm well perfused hand.   Vitals:   11/11/23 0900 11/11/23 1236  BP: (!) 113/49 (!) 125/57  Pulse: 71 79  Resp: 18 17  Temp: (!) 97.5 F (36.4 C) 98.3 F (36.8 C)  SpO2: 98% 97%    Opiates Today (MME): Today's  total administered Morphine Milligram Equivalents: 7.5 Opiates Yesterday (MME): Yesterday's total administered Morphine Milligram Equivalents: 5 Opiates Used in the last two days:  Inpatient Morphine Milligram Equivalents Per Day 11/5 - 11/10   Values displayed are in units of MME/Day    Order Start / End Date 11/5 11/6 11/7 11/8 Yesterday Today    fentaNYL  (SUBLIMAZE ) injection 50 mcg 11/5 - 11/5 15 of 15 -- -- -- -- --    HYDROcodone -acetaminophen  (NORCO/VICODIN) 5-325 MG per tablet 1-2 tablet 11/6 - 11/10 -- 0 of 20-40 0 of 30-60 0 of 30-60 5 of 35-70 0 of 10-20    fentaNYL  (SUBLIMAZE ) injection 12.5-50 mcg 11/6 - 11/6 -- 60 of 18.75-75 -- -- -- --    HYDROmorphone (DILAUDID) injection 0.25 mg 11/6 - 11/6 -- 0 of 5 -- -- -- --    morphine (PF) 4 MG/ML injection 4 mg 11/6 - 11/8 -- 24 of 48 24 of 72 12 of 48 -- --    morphine (PF) 4 MG/ML injection 4 mg 11/10 - No end date -- -- -- -- -- 0 of 48    oxyCODONE  (Oxy IR/ROXICODONE ) immediate release tablet 5 mg 11/10 - No end date -- -- -- -- -- 7.5 of 30    Daily Totals  15 of 15 84 of 91.75-168 24 of 102-132 12 of 78-108 5 of 35-70 7.5 of 88-98        ASSESSMENT: Teresa Jensen is a 78 y.o. female   Right proximal humerus fracture:   Orthopedics recommends admission to a medical service and we will provide consultation and follow along.   The risks benefits and alternatives were discussed with the patient including but not limited to the risks of nonoperative treatment, versus surgical intervention including infection, bleeding, nerve injury, periprosthetic fracture, the need for revision surgery blood clots, cardiopulmonary complications, morbidity, mortality, among others, and they were willing to proceed.     We additionally specifically discussed risks of axillary nerve injury, infection, periprosthetic fracture, continued pain and longevity of implants prior to beginning procedure.     This was discussed with the patient's son Teresa Jensen who is also in agreement with the treatment plan.   Surgery will be delayed until likely next week depending on medical stabilization.    - Weight Bearing Status/Activity: NWB in sling - Right reverse total shoulder arthroplasty for fracture with Dr. Cristy   Right pubic rami fracture:  -WBAT  - non-operative management  Contact information:  Weekdays 8-5 Dr. Bonner Cristy, Aleck Stalling PA-C, After hours and holidays please check [email or web address] for group call information for [facility]  Aleck Stalling, PA-C 11/11/23

## 2023-11-11 NOTE — [place] FL2 (Signed)
 [place]  MEDICAID FL2 LEVEL OF CARE FORM     IDENTIFICATION  Patient Name: Teresa Jensen Birthdate: [birth year] Sex: female Admission Date (Current Location): 11/06/2023  [facility] and [place] Number:  Chiropodist and Address:  [facility], [address] [place], [name] [address]      Provider Number: [id number]  Attending Physician Name and Address:  Tobie Yetta HERO, MD  Relative Name and Phone Number:       Current Level of Care: Hospital Recommended Level of Care: Skilled Nursing Facility Prior Approval Number:    Date Approved/Denied:   PASRR Number: [id number]  Discharge Plan: SNF    Current Diagnoses: Patient Active Problem List   Diagnosis Date Noted   Closed fracture of multiple pubic rami (HCC) 11/07/2023   CKD (chronic kidney disease), stage III (HCC) 11/07/2023   Right supracondylar humerus fracture 11/07/2023   Multiple rib fractures 11/06/2023   Myalgia 08/13/2023   Skin breakdown 06/21/2023   Candidal intertrigo 03/21/2023   Urinary urgency 11/21/2022   Acute on chronic diastolic (congestive) heart failure (HCC) 09/06/2022   Acute on chronic respiratory failure with hypoxia (HCC) 09/06/2022   Allergic reaction to contrast dye 07/17/2022   Intermittent claudication 07/04/2022   Lipodermatosclerosis of both lower extremities 02/22/2022   Solitary pulmonary nodule 11/27/2021   Chronic right shoulder pain 08/04/2021   Lower extremity pain 08/04/2021   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   Morbid obesity (HCC) 05/01/2021   Foot callus 09/01/2020   Rash and nonspecific skin eruption 07/07/2020   History of colonic polyps 11/01/2019   Bilateral carpal tunnel syndrome 08/16/2019   Chronic heart failure with preserved ejection fraction (HFpEF) (HCC) 11/12/2018   DOE (dyspnea on exertion) 11/12/2018   Osteoporosis 12/16/2017   Low vitamin B12 level 01/13/2017   Hypokalemia 01/12/2017   Essential  hypertension 01/08/2017   Closed fracture of metatarsal bone 09/28/2016   Prediabetes 10/17/2015   Pain of right lower extremity 05/20/2015   Medicare annual wellness visit, subsequent 10/12/2014   Occipital neuralgia 10/12/2014   Vitamin D  deficiency 10/12/2014   Erythema of lower extremity 09/08/2013   COPD GOLD 2  06/05/2010   Hypothyroidism 09/29/2009   Situational anxiety 10/01/2008   COPD 10/01/2008   GERD 10/01/2008   Osteoarthritis 10/01/2008    Orientation RESPIRATION BLADDER Height & Weight     Self, Time, Situation, Place  O2 Incontinent Weight: 160 lb (72.6 kg) Height:  4' 9 (144.8 cm)  BEHAVIORAL SYMPTOMS/MOOD NEUROLOGICAL BOWEL NUTRITION STATUS      Continent Diet (please see discharge summary)  AMBULATORY STATUS COMMUNICATION OF NEEDS Skin   Limited Assist Verbally Normal                       Personal Care Assistance Level of Assistance  Bathing, Feeding, Dressing Bathing Assistance: Limited assistance Feeding assistance: Independent Dressing Assistance: Limited assistance     Functional Limitations Info  Sight, Hearing, Speech Sight Info: Adequate Hearing Info: Adequate Speech Info: Adequate    SPECIAL CARE FACTORS FREQUENCY  PT (By licensed PT), OT (By licensed OT)     PT Frequency: 5x per week OT Frequency: 5x per week            Contractures Contractures Info: Not present    Additional Factors Info  Code Status, Allergies Code Status Info: FULL Allergies Info: Epinephrine  High  Anaphylaxis, Shortness Of Breath   Food High Allergy Rash MANGO = cause rashes  around the mouth LIPS GO NUMB  Mangifera [employer]ica High  Rash THIS IS MANGO LIPS GO NUMB  Prednisone  High Allergy Anaphylaxis, Hives, Shortness Of Breath, Itching, Swelling The face swells HAS TAKEN CORTISONE SHOTS WITHOUT ISSUE BEFORE Cannot take oral tablets either  Ivp Dye (iodinated Contrast Media) Medium  Hives   Lactose Intolerance (gi) Not Specified  Diarrhea   Prevnar 13  (pneumococcal 13-val Conj Vacc) Low Allergy Other (See Comments) Arm temp elevated at site of injection, red, itching           Current Medications (11/11/2023):  This is the current hospital active medication list Current Facility-Administered Medications  Medication Dose Route Frequency Provider Last Rate Last Admin   acetaminophen  (TYLENOL ) tablet 1,000 mg  1,000 mg Oral TID Patel, Pranav M, MD   1,000 mg at 11/11/23 1240   Or   acetaminophen  (TYLENOL ) suppository 650 mg  650 mg Rectal TID Patel, Pranav M, MD       albuterol  (PROVENTIL ) (2.5 MG/3ML) 0.083% nebulizer solution 2.5 mg  2.5 mg Nebulization Q2H PRN Doutova, Anastassia, MD       amiodarone (NEXTERONE PREMIX) 360-4.14 MG/200ML-% (1.8 mg/mL) IV infusion  30 mg/hr Intravenous Continuous Mansy, Jan A, MD 16.67 mL/hr at 11/11/23 1330 30 mg/hr at 11/11/23 1330   arformoterol (BROVANA) nebulizer solution 15 mcg  15 mcg Nebulization BID Patel, Pranav M, MD       And   umeclidinium bromide (INCRUSE ELLIPTA) 62.5 MCG/ACT 1 puff  1 puff Inhalation Daily Patel, Pranav M, MD       atorvastatin  (LIPITOR) tablet 10 mg  10 mg Oral Daily Doutova, Anastassia, MD   10 mg at 11/11/23 0925   cefTRIAXone  (ROCEPHIN ) 2 g in sodium chloride  0.9 % 100 mL IVPB  2 g Intravenous Q24H Bevely Damien RAMAN, RPH 200 mL/hr at 11/11/23 1333 2 g at 11/11/23 1333   docusate sodium  (COLACE) capsule 100 mg  100 mg Oral BID PRN Patel, Pranav M, MD       famotidine  (PEPCID ) tablet 20 mg  20 mg Oral Daily Patel, Pranav M, MD   20 mg at 11/11/23 1241   levothyroxine  (SYNTHROID ) tablet 75 mcg  75 mcg Oral Q0600 Doutova, Anastassia, MD   75 mcg at 11/11/23 0616   methocarbamol (ROBAXIN) tablet 500 mg  500 mg Oral TID Lovick, Ayesha N, MD   500 mg at 11/11/23 0925   morphine (PF) 4 MG/ML injection 4 mg  4 mg Intravenous Q4H PRN Patel, Pranav M, MD       ondansetron  (ZOFRAN ) tablet 4 mg  4 mg Oral Q6H PRN Doutova, Anastassia, MD       Or   ondansetron  (ZOFRAN ) injection 4 mg   4 mg Intravenous Q6H PRN Doutova, Anastassia, MD   4 mg at 11/10/23 0944   oxyCODONE  (Oxy IR/ROXICODONE ) immediate release tablet 5 mg  5 mg Oral Q4H PRN Patel, Pranav M, MD   5 mg at 11/11/23 1240   pantoprazole  (PROTONIX ) EC tablet 40 mg  40 mg Oral QHS Gretel Prentice BIRCH, RPH   40 mg at 11/10/23 2111   pentoxifylline  (TRENTAL ) CR tablet 400 mg  400 mg Oral BID Patel, Pranav M, MD       prochlorperazine (COMPAZINE) injection 10 mg  10 mg Intravenous Q6H PRN Patel, Pranav M, MD       spironolactone  (ALDACTONE ) tablet 12.5 mg  12.5 mg Oral Daily Doutova, Anastassia, MD   12.5 mg at 11/11/23 [phone]  Discharge Medications: Please see discharge summary for a list of discharge medications.  Relevant Imaging Results:  Relevant Lab Results:   Additional Information SSN [id number]  Montie LOISE Louder, LCSW

## 2023-11-12 DIAGNOSIS — Z01810 Encounter for preprocedural cardiovascular examination: Secondary | ICD-10-CM | POA: Diagnosis not present

## 2023-11-12 DIAGNOSIS — I4891 Unspecified atrial fibrillation: Secondary | ICD-10-CM | POA: Diagnosis not present

## 2023-11-12 DIAGNOSIS — I5032 Chronic diastolic (congestive) heart failure: Secondary | ICD-10-CM | POA: Diagnosis not present

## 2023-11-12 DIAGNOSIS — W1830XA Fall on same level, unspecified, initial encounter: Secondary | ICD-10-CM | POA: Diagnosis not present

## 2023-11-12 LAB — BASIC METABOLIC PANEL WITH GFR
Anion gap: 10 (ref 5–15)
BUN: 40 mg/dL — ABNORMAL HIGH (ref 8–23)
CO2: 25 mmol/L (ref 22–32)
Calcium: 8 mg/dL — ABNORMAL LOW (ref 8.9–10.3)
Chloride: 97 mmol/L — ABNORMAL LOW (ref 98–111)
Creatinine, Ser: 1.13 mg/dL — ABNORMAL HIGH (ref 0.44–1.00)
GFR, Estimated: 50 mL/min — ABNORMAL LOW (ref >60)
Glucose, Bld: 107 mg/dL — ABNORMAL HIGH (ref 70–99)
Potassium: 4.3 mmol/L (ref 3.5–5.1)
Sodium: 132 mmol/L — ABNORMAL LOW (ref 135–145)

## 2023-11-12 LAB — CBC
HCT: 29.5 % — ABNORMAL LOW (ref 36.0–46.0)
Hemoglobin: 9.4 g/dL — ABNORMAL LOW (ref 12.0–15.0)
MCH: 29.2 pg (ref 26.0–34.0)
MCHC: 31.9 g/dL (ref 30.0–36.0)
MCV: 91.6 fL (ref 80.0–100.0)
Platelets: 241 K/uL (ref 150–400)
RBC: 3.22 MIL/uL — ABNORMAL LOW (ref 3.87–5.11)
RDW: 13.2 % (ref 11.5–15.5)
WBC: 8.1 K/uL (ref 4.0–10.5)
nRBC: 0 % (ref 0.0–0.2)

## 2023-11-12 MED ORDER — ALPRAZOLAM 0.25 MG PO TABS
0.2500 mg | ORAL_TABLET | Freq: Three times a day (TID) | ORAL | Status: DC | PRN
  Administered 2023-11-12 – 2023-11-21 (×14): 0.25 mg via ORAL
  Filled 2023-11-12 (×16): qty 1

## 2023-11-12 NOTE — Progress Notes (Signed)
 Physical Therapy Treatment Patient Details Name: Teresa Jensen MRN: [id number] DOB: Aug 30, 1945 Today's Date: 11/12/2023   History of Present Illness Pt is a 78 y.o. female who presented 11/06/23 after fall. Xrays of the right shoulder demonstrate a displaced proximal humerus fracture. CT pelvis demonstrates right sided pubic rami fractures. Plan for right reverse TSA  (delayed) and non-op management of RLE. 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    PT Comments  Good progress towards functional goals, pt motivated to work with therapy today. Mod assist for bed mobility, sit to stand transfer with hand held support, and Mod assist +2 for stand pivot transfer to recliner. Poor WB tolerance with buckling of RLE, requires physical block from therapist. Reviewed LE exercises. Should do well with stedy - RN notified. Patient will continue to benefit from skilled physical therapy services to further improve independence with functional mobility.     If plan is discharge home, recommend the following: Two people to help with walking and/or transfers;A lot of help with bathing/dressing/bathroom;Assistance with cooking/housework;Assist for transportation;Help with stairs or ramp for entrance   Can travel by private vehicle     No  Equipment Recommendations  Hospital bed;Hoyer lift;Wheelchair (measurements PT);Wheelchair cushion (measurements PT)    Recommendations for Other Services       Precautions / Restrictions Precautions Precautions: Fall Recall of Precautions/Restrictions: Impaired Required Braces or Orthoses: Sling Restrictions Weight Bearing Restrictions Per Provider Order: No RUE Weight Bearing Per Provider Order: Non  weight bearing RLE Weight Bearing Per Provider Order: Weight bearing as tolerated     Mobility  Bed Mobility Overal bed mobility: Needs Assistance Bed Mobility: Supine to Sit     Supine to sit: HOB elevated, Mod assist, Used rails     General bed mobility comments: Used rail with LUE, able to bring LEs off bed with some support for RLE. Cues for sequencing, mod assist to support trunk and slide bed pad with pt to EOB.    Transfers Overall transfer level: Needs assistance Equipment used: 1 person hand held assist Transfers: Sit to/from Stand, Bed to chair/wheelchair/BSC Sit to Stand: Mod assist, +2 safety/equipment Stand pivot transfers: Mod assist, +2 safety/equipment         General transfer comment: Mod assist for boost to stand from bed with hand held support on Lt, Rt knee block for safety. Pt able pt pivot with weight on LLE, but could not fully bear weight through RLE. Mod assist for transfer to chair to block Rt knee and assist with sequencing of pivot safely +2 available for support/equipment.    Ambulation/Gait               General Gait Details: Unable   Stairs             Wheelchair Mobility     Tilt Bed    Modified Rankin (Stroke Patients Only)       Balance Overall balance assessment: Needs assistance Sitting-balance support: Feet supported, No upper extremity supported Sitting balance-Leahy Scale: Fair     Standing balance support: Single extremity supported, During functional activity Standing balance-Leahy Scale: Poor Standing balance comment: Stands statically with Single UE support.  Communication Communication Communication: No apparent difficulties  Cognition Arousal: Alert Behavior During Therapy: WFL for tasks assessed/performed, Anxious   PT - Cognitive impairments: No apparent impairments                       PT - Cognition Comments: Pt A,Ox4. Following commands:  Intact Following commands impaired: Only follows one step commands consistently    Cueing Cueing Techniques: Verbal cues  Exercises General Exercises - Lower Extremity Ankle Circles/Pumps: AROM, Both, 10 reps, Seated Quad Sets: Strengthening, Both, 10 reps, Seated Gluteal Sets: Strengthening, Both, 10 reps, Seated    General Comments General comments (skin integrity, edema, etc.): SpO2 100% on 3L, 90% on RA with exertion.      Pertinent Vitals/Pain Pain Assessment Pain Assessment: Faces Faces Pain Scale: Hurts whole lot Pain Location: R shoulder/upper arm and R hip Pain Descriptors / Indicators: Grimacing, Guarding, Discomfort, Aching Pain Intervention(s): Limited activity within patient's tolerance, Monitored during session, Repositioned    Home Living                          Prior Function            PT Goals (current goals can now be found in the care plan section) Acute Rehab PT Goals Patient Stated Goal: Have less pain and move better PT Goal Formulation: With patient/family Time For Goal Achievement: 11/24/23 Potential to Achieve Goals: Fair Progress towards PT goals: Progressing toward goals    Frequency    Min 2X/week      PT Plan      Co-evaluation              AM-PAC PT 6 Clicks Mobility   Outcome Measure  Help needed turning from your back to your side while in a flat bed without using bedrails?: A Lot Help needed moving from lying on your back to sitting on the side of a flat bed without using bedrails?: A Lot Help needed moving to and from a bed to a chair (including a wheelchair)?: A Lot Help needed standing up from a chair using your arms (e.g., wheelchair or bedside chair)?: A Lot Help needed to walk in hospital room?: Total Help needed climbing 3-5 steps with a railing? : Total 6 Click Score: 10    End of Session Equipment Utilized During Treatment: Gait belt;Oxygen Activity Tolerance: Patient limited by pain Patient  left: in chair;with call bell/phone within reach;with chair alarm set;with family/visitor present (Declines SCDs) Nurse Communication: Mobility status;Need for lift equipment Laurent for transfers) PT Visit Diagnosis: Pain;Difficulty in walking, not elsewhere classified (R26.2);Other abnormalities of gait and mobility (R26.89);Unsteadiness on feet (R26.81);Repeated falls (R29.6);Muscle weakness (generalized) (M62.81);History of falling (Z91.81) Pain - Right/Left: Right Pain - part of body: Shoulder;Hip     Time: 8666-8596 PT Time Calculation (min) (ACUTE ONLY): 30 min  Charges:    $Therapeutic Exercise: 8-22 mins $Therapeutic Activity: 8-22 mins PT General Charges $$ ACUTE PT VISIT: 1 Visit                     Leontine Roads, PT, DPT [facility]  Rehabilitation Services Physical Therapist Office: [phone] Website: [email or web address]    Leontine GORMAN Roads 11/12/2023, 4:00 PM

## 2023-11-12 NOTE — Plan of Care (Signed)

## 2023-11-12 NOTE — Progress Notes (Signed)
 [facility] Progress Note Patient: Teresa Jensen DOB: June 18, 1945  DOA: 11/06/2023 DOS: the patient was seen and examined on 11/12/2023  Brief Hospital Course: Teresa Jensen is a 78 y.o. 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  Patient sustained right pubic ramus fracture with hematoma in pelvis, right humeral head fracture.  Trauma surgery cleared her, orthopedic consulted, admitted to TRH service.   Assessment and Plan: Status post fall, multiple injuries Right humerus fracture Closed pubic rami fracture Cleared by trauma service. Orthopedic surgery planning on ORIF right humerus. Surgery now postponed due to bacteremia.  Klebsiella bacteremia. Etiology not clear. Patient developed temperature a few days ago blood culture performed. Urine culture still pending. Chest x-ray unremarkable. CT abdomen and pelvis negative for any acute abnormality done on admission. At present will initiate IV ceftriaxone . Repeat cultures on Wednesday. Cultures remaining negative for 48 hours patient can be safely considered for surgery.  But currently surgery has to be postponed due to risk of infection.   New onset paroxysmal Afib with RVR- Early AM she had HR> 140, due to he borderline low BP, Amiodarone drip initiated. She is sinus this AM. Cardiology consult appreciated, continue Amio drip till surgery, no anticoagulation until Afib recurs, outpatient holter monitor per cardiology. Also not a good candidate for anticoagulation given pelvic hematoma and prior  history of recurrent epistaxis. Continue telemetry.    Chronic hypoxic respiratory failure - Hypoxia, oxygen requirement worsened in the setting of trauma, pain.  She does have CHF. Continue supplemental oxygen, wean to home 1-2L O2. CT chest ruled out rib fractures. Continue bronchodilators, Lasix  home dose.   Chronic heart failure with preserved ejection fraction- Patient does have basilar rhonchi, rales, increased supplemental oxygen requirement. S/p IV lasix . decreased oral Lasix  to 20 mg daily. Follow daily renal function, electrolytes, weight, strict input and output.   COPD History of pulmonary fibrosis No exacerbation. Home o2 1-2L. Continue bronchodilators as needed.   Essential hypertension: BP lower side.  Caution with antihypertensive regimen.   GERD: Continue Protonix .   Hypothyroidism: Continue home Synthroid .   CKD stage III A- Kidney function stable. Avoid nephrotoxic drugs, hypertension, contrast. Monitor daily renal function.   Obesity class I- Body mass index is 34.62 kg/m.  Diet, exercise and weight reduction advised. Placing the patient at high risk of poor outcome.   Subjective: Pain well-controlled.  No nausea no vomiting.  No acute complaint.  Physical Exam: Faint basilar crackles. S1-S2 present Bowel sound present No edema.  Data Reviewed: I have Reviewed nursing notes, Vitals, and Lab results. Reviewed CBC and BMP. Reordered CBC and BMP.  Disposition: Status is: Inpatient Remains inpatient appropriate because: Monitor for improvement in surgical status and surgery  heparin injection 5,000 Units Start: 11/11/23 2200 SCDs Start: 11/07/23 0943   Family Communication: Family at bedside.   Level of care: Progressive Vitals:   11/12/23 0900 11/12/23 1053 11/12/23 1300 11/12/23 1531  BP: (!) 127/47 (!) 120/47 (!) 123/37 (!) 121/44  Pulse: 67 65 66 70  Resp: 17 20 17 14   Temp: 97.9 F (36.6 C) 97.6 F (36.4 C) (!) 97.5 F (36.4 C)  97.9 F (36.6 C)  TempSrc: Oral Oral Oral Oral  SpO2: 97% 100% 100% 99%  Weight:      Height:         Author: Yetta Blanch, MD 11/12/2023 6:58 PM  Please look on [email or web address] to find out who is on call.

## 2023-11-12 NOTE — TOC Progression Note (Addendum)
 Transition of Care [facility]) - Progression Note    Patient Details  Name: Teresa Jensen MRN: [id number] Date of Birth: 08-Oct-1945  [facility] CM/SW Contact  Teresa Jensen, [name] Phone Number: 11/12/2023, 3:27 PM  Clinical Narrative:     CSW met with patient and her spouse- informed [facility] has no availability but [facility] has offered. They accepted bed offer with [facility].   Sent message to [facility]- will need to confirmed bed availability once close to being stable.   Teresa Jensen, MSW, LCSW Clinical Social Worker    Expected Discharge Plan: Skilled Nursing Facility Barriers to Discharge: Continued Medical Work up               Expected Discharge Plan and Services In-house Referral: Clinical Social Work     Living arrangements for the past 2 months: Single Family Home                                       Social Drivers of Health (SDOH) Interventions SDOH Screenings   Food Insecurity: Patient Declined (11/08/2023)  Housing: Unknown (11/08/2023)  Transportation Needs: Patient Declined (11/08/2023)  Utilities: Patient Declined (11/08/2023)  Alcohol Screen: Low Risk  (05/07/2022)  Depression (PHQ2-9): Low Risk  (08/13/2023)  Financial Resource Strain: Patient Declined (05/01/2023)  Physical Activity: Sufficiently Active (05/01/2023)  Social Connections: Unknown (11/08/2023)  Stress: No Stress Concern Present (05/01/2023)  Tobacco Use: Medium Risk (11/06/2023)  Health Literacy: Adequate Health Literacy (05/08/2023)    Readmission Risk Interventions     No data to display

## 2023-11-13 DIAGNOSIS — I4891 Unspecified atrial fibrillation: Secondary | ICD-10-CM | POA: Diagnosis not present

## 2023-11-13 DIAGNOSIS — Z01810 Encounter for preprocedural cardiovascular examination: Secondary | ICD-10-CM | POA: Diagnosis not present

## 2023-11-13 DIAGNOSIS — I5032 Chronic diastolic (congestive) heart failure: Secondary | ICD-10-CM | POA: Diagnosis not present

## 2023-11-13 LAB — URINE CULTURE: Culture: >=100000 — AB

## 2023-11-13 LAB — CBC
HCT: 28.6 % — ABNORMAL LOW (ref 36.0–46.0)
Hemoglobin: 9.2 g/dL — ABNORMAL LOW (ref 12.0–15.0)
MCH: 30 pg (ref 26.0–34.0)
MCHC: 32.2 g/dL (ref 30.0–36.0)
MCV: 93.2 fL (ref 80.0–100.0)
Platelets: 309 K/uL (ref 150–400)
RBC: 3.07 MIL/uL — ABNORMAL LOW (ref 3.87–5.11)
RDW: 13.5 % (ref 11.5–15.5)
WBC: 8.4 K/uL (ref 4.0–10.5)
nRBC: 0 % (ref 0.0–0.2)

## 2023-11-13 LAB — CULTURE, BLOOD (ROUTINE X 2)

## 2023-11-13 LAB — BASIC METABOLIC PANEL WITH GFR
Anion gap: 13 (ref 5–15)
BUN: 30 mg/dL — ABNORMAL HIGH (ref 8–23)
CO2: 25 mmol/L (ref 22–32)
Calcium: 8.4 mg/dL — ABNORMAL LOW (ref 8.9–10.3)
Chloride: 100 mmol/L (ref 98–111)
Creatinine, Ser: 1.05 mg/dL — ABNORMAL HIGH (ref 0.44–1.00)
GFR, Estimated: 54 mL/min — ABNORMAL LOW (ref >60)
Glucose, Bld: 88 mg/dL (ref 70–99)
Potassium: 4.7 mmol/L (ref 3.5–5.1)
Sodium: 138 mmol/L (ref 135–145)

## 2023-11-13 MED ORDER — SALINE SPRAY 0.65 % NA SOLN: 1.0000 | NASAL | Status: DC | PRN

## 2023-11-13 MED ORDER — CEFAZOLIN SODIUM-DEXTROSE 2-4 GM/100ML-% IV SOLN
2.0000 g | Freq: Three times a day (TID) | INTRAVENOUS | Status: AC
  Administered 2023-11-13 – 2023-11-17 (×14): 2 g via INTRAVENOUS
  Filled 2023-11-13 (×15): qty 100

## 2023-11-13 MED ORDER — OXYMETAZOLINE HCL 0.05 % NA SOLN: 1.0000 | NASAL | Status: AC | PRN

## 2023-11-13 NOTE — Progress Notes (Addendum)
 Progress Note  Patient Name: Teresa Jensen Date of Encounter: 11/13/2023 [facility] Cardiologist: Lonni Hanson, MD   Interval Summary   Up resting in the chair  Reports feeling overall okay, just fatigued Remains in NSR Primary team holding home Lasix  for now, renal function trending down Re-discussed need for [facility] discussion in future, patient understands   Vital Signs Vitals:   11/12/23 2340 11/13/23 0401 11/13/23 0820 11/13/23 0857  BP: (!) 113/31 (!) 117/48  (!) 129/42  Pulse: 81 68 76 80  Resp: 20 20 20    Temp: 97.9 F (36.6 C) 98.1 F (36.7 C)  98.1 F (36.7 C)  TempSrc: Oral Oral  Oral  SpO2: 97% 100%  98%  Weight:      Height:        Intake/Output Summary (Last 24 hours) at 11/13/2023 1034 Last data filed at 11/13/2023 0600 Gross per 24 hour  Intake 238 ml  Output 700 ml  Net -462 ml      11/06/2023    4:40 PM 10/22/2023    9:10 AM 10/14/2023    8:36 AM  Last 3 Weights  Weight (lbs) 160 lb 168 lb 3.2 oz 168 lb 9.6 oz  Weight (kg) 72.576 kg 76.295 kg 76.476 kg     Telemetry/ECG  Sinus rhythm, HR 80s - Personally Reviewed  Physical Exam  GEN: No acute distress.   Neck: No JVD Cardiac: RRR, no murmurs, rubs, or gallops.  Respiratory: Clear to auscultation bilaterally. GI: Soft, nontender, non-distended  MS: No edema, right arm in sling  Assessment & Plan   New onset A-Fib  No prior documented history of A-Fib Noted to be in A-Fib in the setting of mechanical fall and multiple traumatic injuries Started on IV amiodarone given soft BP Converted to NSR Only in A-Fib for ~3 hours Echo: LVEF 60-65%, normal LA Long discussion with patient, family, treatment team regarding anticoagulation given high CHA2DS2-Vasc score -- decision was made to hold off on anticoagulation given history of epistaxis requiring cautery Continue to monitor on telemetry while inpatient  Consider cardiac monitor at discharge to evaluate for recurrence of  A-Fib  Chronic HFpEF Echo this admission LVEF 60-65%, normal wall motion, normal RV function, and mild (at most moderate) aortic stenosis and mild aortic insufficiency Appears euvolemic on exam  Continue spironolactone  12.5 mg daily   Home Lasix  has been held, euvolemic on exam  Hypertension  Stable this admission Continue spironolactone  12.5 mg daily   Hyperlipidemia 05/30/2023: HDL 60; LDL Cholesterol 50 11/07/2023: ALT 18  Continue Lipitor 10 mg daily   Per primary R humerus fracture Pubic rami fracture Chronic hypoxic respiratory failure COPD Emphysema Bacteremia  Anemia Hypothyroidism GERD   For questions or updates, please contact [facility] Please consult [email or web address] for contact info under       Signed, Waddell DELENA Donath, PA-C   ADDENDUM:   Patient seen and examined with Waddell DELENA Donath, PA-C.  I personally taken a history, examined the patient, reviewed relevant notes,  laboratory data / imaging studies.  I performed a substantive portion of this encounter and formulated the important aspects of the plan.  I agree with the APP's note, impression, and recommendations; however, I have edited the note to reflect changes or salient points.   Patient seen and examined at bedside No cardiac symptoms.    PHYSICAL EXAM: Today's Vitals   11/13/23 1434 11/13/23 1504 11/13/23 1617 11/13/23 1628  BP:   (!) 107/49   Pulse:  77   Resp:   16   Temp:   98.4 F (36.9 C)   TempSrc:   Oral   SpO2:   99%   Weight:      Height:      PainSc: 9  2   3     Body mass index is 34.62 kg/m.   Net IO Since Admission: -1,366.51 mL [11/13/23 1741]  Filed Weights   11/06/23 1640  Weight: 72.6 kg    Physical Exam  Constitutional: She appears chronically ill.  Age appropriate, hemodynamically stable, no acute distress.   Neck: No JVD present.  Cardiovascular: Normal rate, regular rhythm, S1 normal and S2 normal. Exam reveals no gallop and no friction rub.   No murmur heard. Pulmonary/Chest: Breath sounds normal. She has no wheezes. She has no rales. She exhibits no tenderness.  Abdominal: Soft. Bowel sounds are normal. She exhibits no distension. There is no abdominal tenderness.  Musculoskeletal:        General: No tenderness or edema.     Comments: Right upper extremity in a sling  Neurological: She is alert and oriented to person, place, and time.  Skin: Skin is warm and moist.    EKG: (personally reviewed by me) No new tracing  Telemetry: (personally reviewed by me) SR without ectopy    Impression & Recommendations: :  Newly discovered atrial fibrillation Preoperative risk stratification Status post mechanical fall with multiple injuries: Right humerus fracture, closed pubic rami fracture,  Right-sided extraperitoneal pelvic hematoma surrounding the bladder,secondary to right-sided pubic rami fractures. Bacteremia Chronic HFpEF. History of COPD/emphysema/home oxygen dependence 1 to 2 L. Hypertension. GERD   Newly discovered atrial fibrillation: In the setting of mechanical fall with sustained fractures Likely precipitated by overall stress and acute illness The patient was placed on IV amiodarone drip for rate and rhythm control strategy earlier this admission According to EMR onset of A-fib was 1:30 AM 11/10/2023 and then transition back to sinus rhythm around 4:30 AM 11/10/2023 She is currently not on anticoagulation High CHA2DS2-VASc score - 6 No prior history of atrial fibrillation. If anticoagulation is considered during this hospitalization she will need to be cleared by trauma and orthopedic services to make sure its safe given her recent injuries. Of note, she also had a CT 11/06/2023 noted Right-sided pelvic hematoma therefore, will hold off anticoagulation for now and continue to monitor.  Patient denies history of gastrointestinal bleeding or intracranial bleeding. No prior history of falls except the recent event.   Ideally, given her high CHA2DS2-VASc and the need off antiarrythmic to convert her to SR oral anticoagulation for thromboembolic prophylaxis should be considered when safe. But risk and benefit for long term use needs to be discussed with patient, son, and care team.  Ideally would recommend anticoagulation for the first 30 days given her high CHA2DS2-VASc score along with long-term monitoring for A-fib burden i.e. cardiac monitor or loop recorder. IF within the first 30 days she has recurrence of Afib than oral anticoagulation should be re-considered to reduce her thromboembolic events risk after discussing the risk vs. Benefit ratio of oral anticoagulation.   However, if she does not have A-fib within the first 30 days then holding anticoagulation and continuing monitoring would be ideal (this would provide monitoring and reduce bleeding risk). Internal medicine team reached out to her son to discuss the role of oral anticoagulation given her CHA2DS2-Vasc score.  Son notes that patient has had history of significant epistaxis needing cautery and therefore would like  to hold of anticoagulation despite knowing her CHA2DS2-Vasc score and annual risk of stroke. Patient also agrees to hold off oral anticoagulation.  Since her surgery is postponed she will be inpatient during which her telemetry can be continued to monitor for future occurences.  Otherwise, consider cardiac monitor at discharge with outpt follow up with her primary cardiologist Dr. Mady.   Preoperative risk stratification: Already provided by Dr. Alvan, please refer to EMR.  Please reach out if change in clinical status in the interim for reevaluation.   Bacteremia: Management primary team   Chronic HFpEF: History of Compensated, euvolemic. BNP slightly elevated at 159 Currently on spironolactone  12.5 mg p.o. daily Recent chest x-ray 11/10/2023: No findings for pulmonary vascular congestion.  Cardiology will sign off for now reach  out if any questions or concerns arise.    This note was created using a voice recognition software as a result there may be grammatical errors inadvertently enclosed that do not reflect the nature of this encounter. Every attempt is made to correct such errors.   Madonna Michele HAS, [facility]  A Division of [facility] [address] [place], [name] [address]  Pager: [phone] Office: [phone] 11/13/2023 5:41 PM

## 2023-11-13 NOTE — Progress Notes (Signed)
 [facility] Progress Note Patient: Teresa Jensen FMW:[id number] DOB: [birth year]  DOA: 11/06/2023 DOS: the patient was seen and examined on 11/13/2023  Brief Hospital Course: Teresa Jensen is a 78 y.o. 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  Patient sustained right pubic ramus fracture with hematoma in pelvis, right humeral head fracture.  Trauma surgery cleared her, orthopedic consulted, admitted to TRH service.   Assessment and Plan: Status post fall, multiple injuries Right humerus fracture Closed pubic rami fracture Cleared by trauma service. Orthopedic surgery planning on ORIF right humerus. Surgery now postponed due to bacteremia.  Klebsiella bacteremia. Etiology not clear. Patient developed temperature a few days ago blood culture performed. Urine culture still pending. Chest x-ray unremarkable. CT abdomen and pelvis negative for any acute abnormality done on admission. At present will initiate IV ceftriaxone . Repeat cultures on Wednesday. Cultures remaining negative for 48 hours patient can be safely considered for surgery.  But currently surgery has to be postponed due to risk of infection.   New onset Afib with RVR- Early AM she had HR> 140, due to he borderline low BP, Amiodarone drip initiated. She is sinus this AM. Cardiology consult appreciated, continue Amio drip till surgery, no anticoagulation until Afib recurs, outpatient holter monitor per cardiology. Continue telemetry.    Chronic hypoxic respiratory failure - Hypoxia, oxygen requirement  worsened in the setting of trauma, pain.  She does have CHF. Continue supplemental oxygen, wean to home 1-2L O2. CT chest ruled out rib fractures. Continue bronchodilators, Lasix  home dose.   Chronic heart failure with preserved ejection fraction- Patient does have basilar rhonchi, rales, increased supplemental oxygen requirement. S/p IV lasix . decreased oral Lasix  to 20 mg daily. Follow daily renal function, electrolytes, weight, strict input and output.   COPD History of pulmonary fibrosis No exacerbation. Home o2 1-2L. Continue bronchodilators as needed.   Essential hypertension BP lower side.  Caution with antihypertensive regimen.   GERD Continue Protonix .   Hypothyroidism: Continue home Synthroid .   CKD stage III A Kidney function stable. Avoid nephrotoxic drugs, hypertension, contrast. Monitor daily renal function.   Obesity class I Body mass index is 34.62 kg/m.  Diet, exercise and weight reduction advised. Placing the patient at high risk of poor outcome.  History of nosebleed. Continue humidity.   Subjective: No nausea no vomiting no fever no chills.  No chest pain.  Reported some nosebleed.  She has a form that she needs to be filled up for [facility] [employer].  Recommended her to bring the form to the hospital.  Physical Exam: Clear to auscultation Bowel sound present S1-S2 present No edema.  Data Reviewed: I have Reviewed nursing notes, Vitals, and Lab results. Since last encounter, pertinent lab results CBC and BMP   . I have ordered test including CBC and BMP  .   Disposition: Status is: Inpatient Remains inpatient appropriate because: Awaiting culture clearance and surgery  heparin injection 5,000 Units Start: 11/11/23 2200 SCDs Start: 11/07/23 0943   Family Communication: No one at bedside Level of care: Progressive   Vitals:   11/13/23 0401 11/13/23 0820 11/13/23 0857 11/13/23 1617  BP: (!) 117/48  ROLLEN)  129/42 (!) 107/49  Pulse: 68 76 80 77   Resp: 20 20  16   Temp: 98.1 F (36.7 C)  98.1 F (36.7 C) 98.4 F (36.9 C)  TempSrc: Oral  Oral Oral  SpO2: 100%  98% 99%  Weight:      Height:         Author: Yetta Blanch, MD 11/13/2023 7:11 PM  Please look on [email or web address] to find out who is on call.

## 2023-11-13 NOTE — Progress Notes (Signed)
 Occupational Therapy Treatment Patient Details Name: Teresa Jensen MRN: [id number] DOB: [birth year] Today's Date: 11/13/2023   History of present illness Pt is a 78 y.o. female who presented 11/06/23 after fall. Xrays of the right shoulder demonstrate a displaced proximal humerus fracture. CT pelvis demonstrates right sided pubic rami fractures. Plan for right reverse TSA  (delayed) and non-op management of RLE. 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   OT comments  Pt progressing toward goals, requires mod A for bed mobility and transfers via stedy from bed > chair this session. Pt max A for LB ADL. Pt with incr R shoulder and hip pain, declines further ADLs in [place] or at chair level. Pt presenting with impairments listed below, will follow acutely. Patient will benefit from continued inpatient follow up therapy, <3 hours/day to maximize safety/ind with ADL/functional mobility.       If plan is discharge home, recommend the following:  Two people to help with walking and/or transfers;A lot of help with bathing/dressing/bathroom;Assistance with cooking/housework;Assist for transportation;Help with stairs or ramp for entrance   Equipment Recommendations  BSC/3in1    Recommendations for Other Services      Precautions / Restrictions Precautions Precautions: Fall Recall of Precautions/Restrictions: Impaired Required Braces or Orthoses: Sling Restrictions Weight Bearing Restrictions Per Provider Order: Yes RUE Weight Bearing Per Provider Order: Non weight bearing RLE Weight Bearing Per Provider Order: Weight bearing as tolerated       Mobility Bed Mobility Overal bed mobility: Needs Assistance Bed Mobility: Supine to  Sit     Supine to sit: HOB elevated, Mod assist, Used rails          Transfers Overall transfer level: Needs assistance Equipment used: Ambulation equipment used Transfers: Sit to/from Stand, Bed to chair/wheelchair/BSC Sit to Stand: Mod assist Stand pivot transfers: Mod assist           Transfer via Lift Equipment: Stedy   Balance Overall balance assessment: Needs assistance Sitting-balance support: Feet supported, No upper extremity supported Sitting balance-Leahy Scale: Fair     Standing balance support: Single extremity supported, During functional activity Standing balance-Leahy Scale: Poor Standing balance comment: Stands statically with Single UE support.                           ADL either performed or assessed with clinical judgement   ADL Overall ADL's : Needs assistance/impaired                     Lower Body Dressing: Maximal assistance;Sitting/lateral leans   Toilet Transfer: Moderate assistance Toilet Transfer Details (indicate cue type and reason): via stedy         Functional mobility during ADLs: Moderate assistance      Extremity/Trunk Assessment Upper Extremity Assessment Upper Extremity Assessment: Right hand dominant RUE Deficits / Details: R humerus fx, in sling. plans for surgery later this week RUE: Unable to fully assess due to immobilization RUE Coordination: decreased gross motor;decreased fine motor   Lower Extremity Assessment Lower Extremity Assessment: Defer to PT evaluation        Vision   Additional Comments: poor vision at baseline   Perception Perception Perception: Not tested   Praxis Praxis Praxis: Not tested   Communication Communication Communication: No  apparent difficulties   Cognition Arousal: Alert Behavior During Therapy: WFL for tasks assessed/performed, Anxious Cognition: No apparent impairments                               Following commands: Intact Following  commands impaired: Only follows one step commands consistently      Cueing   Cueing Techniques: Verbal cues  Exercises      Shoulder Instructions       General Comments VSS on 2L O2    Pertinent Vitals/ Pain       Pain Assessment Pain Assessment: Faces Pain Score: 8  Faces Pain Scale: Hurts whole lot Pain Location: R shoulder/upper arm and R hip Pain Descriptors / Indicators: Grimacing, Guarding, Discomfort, Aching Pain Intervention(s): Limited activity within patient's tolerance, Monitored during session, Repositioned  Home Living                                          Prior Functioning/Environment              Frequency  Min 2X/week        Progress Toward Goals  OT Goals(current goals can now be found in the care plan section)  Progress towards OT goals: Progressing toward goals  Acute Rehab OT Goals Patient Stated Goal: none stated OT Goal Formulation: With patient Time For Goal Achievement: 11/25/23 Potential to Achieve Goals: Good ADL Goals Pt Will Perform Grooming: with set-up;sitting Pt Will Perform Lower Body Dressing: sit to/from stand;sitting/lateral leans;with mod assist;with adaptive equipment Pt Will Transfer to Toilet: with mod assist;stand pivot transfer;bedside commode Additional ADL Goal #1: Pt will complete bed moblity with min assist.  Plan      Co-evaluation                 AM-PAC OT 6 Clicks Daily Activity     Outcome Measure   Help from another person eating meals?: A Little Help from another person taking care of personal grooming?: A Little Help from another person toileting, which includes using toliet, bedpan, or urinal?: Total Help from another person bathing (including washing, rinsing, drying)?: A Lot Help from another person to put on and taking off regular upper body clothing?: A Lot Help from another person to put on and taking off regular lower body clothing?: Total 6 Click Score: 12     End of Session Equipment Utilized During Treatment: Oxygen;Other (comment) (sling)  OT Visit Diagnosis: Other abnormalities of gait and mobility (R26.89);Muscle weakness (generalized) (M62.81);Pain;History of falling (Z91.81) Pain - Right/Left: Right Pain - part of body: Shoulder;Arm   Activity Tolerance Patient limited by fatigue;Patient limited by pain   Patient Left in bed;with call bell/phone within reach;with bed alarm set;with family/visitor present   Nurse Communication Mobility status;Precautions        Time: 9065-9044 OT Time Calculation (min): 21 min  Charges: OT General Charges $OT Visit: 1 Visit OT Treatments $Therapeutic Activity: 8-22 mins  Bethany Hirt K, OTD, OTR/L SecureChat Preferred Acute Rehab [phone]   Teresa Jensen 11/13/2023, 12:44 PM

## 2023-11-13 NOTE — Plan of Care (Signed)
   Problem: Clinical Measurements: Goal: Will remain free from infection Outcome: Progressing Goal: Diagnostic test results will improve Outcome: Progressing   Problem: Activity: Goal: Risk for activity intolerance will decrease Outcome: Progressing

## 2023-11-14 ENCOUNTER — Other Ambulatory Visit (HOSPITAL_COMMUNITY): Payer: Self-pay

## 2023-11-14 DIAGNOSIS — R7881 Bacteremia: Secondary | ICD-10-CM

## 2023-11-14 DIAGNOSIS — B961 Klebsiella pneumoniae [K. pneumoniae] as the cause of diseases classified elsewhere: Secondary | ICD-10-CM

## 2023-11-14 DIAGNOSIS — J9621 Acute and chronic respiratory failure with hypoxia: Secondary | ICD-10-CM | POA: Diagnosis not present

## 2023-11-14 DIAGNOSIS — S42411A Displaced simple supracondylar fracture without intercondylar fracture of right humerus, initial encounter for closed fracture: Secondary | ICD-10-CM | POA: Diagnosis not present

## 2023-11-14 DIAGNOSIS — W1830XA Fall on same level, unspecified, initial encounter: Secondary | ICD-10-CM | POA: Diagnosis not present

## 2023-11-14 DIAGNOSIS — I5033 Acute on chronic diastolic (congestive) heart failure: Secondary | ICD-10-CM | POA: Diagnosis not present

## 2023-11-14 LAB — CBC WITH DIFFERENTIAL/PLATELET
Abs Immature Granulocytes: 0.08 K/uL — ABNORMAL HIGH (ref 0.00–0.07)
Basophils Absolute: 0 K/uL (ref 0.0–0.1)
Basophils Relative: 0 %
Eosinophils Absolute: 0.1 K/uL (ref 0.0–0.5)
Eosinophils Relative: 1 %
HCT: 27.3 % — ABNORMAL LOW (ref 36.0–46.0)
Hemoglobin: 8.6 g/dL — ABNORMAL LOW (ref 12.0–15.0)
Immature Granulocytes: 1 %
Lymphocytes Relative: 18 %
Lymphs Abs: 1.7 K/uL (ref 0.7–4.0)
MCH: 29.6 pg (ref 26.0–34.0)
MCHC: 31.5 g/dL (ref 30.0–36.0)
MCV: 93.8 fL (ref 80.0–100.0)
Monocytes Absolute: 1.1 K/uL — ABNORMAL HIGH (ref 0.1–1.0)
Monocytes Relative: 12 %
Neutro Abs: 6.3 K/uL (ref 1.7–7.7)
Neutrophils Relative %: 68 %
Platelets: 313 K/uL (ref 150–400)
RBC: 2.91 MIL/uL — ABNORMAL LOW (ref 3.87–5.11)
RDW: 13.6 % (ref 11.5–15.5)
Smear Review: NORMAL
WBC: 9.2 K/uL (ref 4.0–10.5)
nRBC: 0 % (ref 0.0–0.2)

## 2023-11-14 LAB — COMPREHENSIVE METABOLIC PANEL WITH GFR
ALT: 25 U/L (ref 0–44)
AST: 26 U/L (ref 15–41)
Albumin: 2.1 g/dL — ABNORMAL LOW (ref 3.5–5.0)
Alkaline Phosphatase: 59 U/L (ref 38–126)
Anion gap: 12 (ref 5–15)
BUN: 28 mg/dL — ABNORMAL HIGH (ref 8–23)
CO2: 25 mmol/L (ref 22–32)
Calcium: 8.3 mg/dL — ABNORMAL LOW (ref 8.9–10.3)
Chloride: 98 mmol/L (ref 98–111)
Creatinine, Ser: 1.15 mg/dL — ABNORMAL HIGH (ref 0.44–1.00)
GFR, Estimated: 49 mL/min — ABNORMAL LOW (ref >60)
Glucose, Bld: 98 mg/dL (ref 70–99)
Potassium: 4.5 mmol/L (ref 3.5–5.1)
Sodium: 135 mmol/L (ref 135–145)
Total Bilirubin: 0.2 mg/dL (ref 0.0–1.2)
Total Protein: 6.1 g/dL — ABNORMAL LOW (ref 6.5–8.1)

## 2023-11-14 LAB — MAGNESIUM: Magnesium: 2.2 mg/dL (ref 1.7–2.4)

## 2023-11-14 MED ORDER — TRAMADOL HCL 50 MG PO TABS
50.0000 mg | ORAL_TABLET | Freq: Four times a day (QID) | ORAL | Status: AC | PRN
Start: 2023-11-14 — End: ?
  Administered 2023-11-14 – 2023-11-19 (×3): 50 mg via ORAL
  Filled 2023-11-14 (×4): qty 1

## 2023-11-14 NOTE — Progress Notes (Signed)
 Brief Rounding Note:   Telemetry Reviewed:  Sinus  One asymptomatic episode of PSVT 6 beat.  No Afib.   No charge.   Dr. Sunny Gains

## 2023-11-14 NOTE — Progress Notes (Signed)
 [facility] Progress Note Patient: Teresa Jensen FMW:[id number] DOB: Aug 18, 1945  DOA: 11/06/2023 DOS: the patient was seen and examined on 11/14/2023  Brief Hospital Course: Teresa Jensen is a 78 y.o. 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  Patient sustained right pubic ramus fracture with hematoma in pelvis, right humeral head fracture.  Trauma surgery cleared her, orthopedic consulted, admitted to TRH service.  11/13: Hemodynamically stable, continue to have pain, repeat blood cultures negative in 1 day, likely will have her surgery on Monday per orthopedic surgery.   Assessment and Plan: Status post fall, multiple injuries Right humerus fracture Closed pubic rami fracture Cleared by trauma service. Orthopedic surgery planning on ORIF right humerus. Surgery now postponed due to bacteremia. Likely will get her surgery on Monday - Continue with pain control and supportive care  Klebsiella bacteremia. Likely secondary to UTI as blood and urine cultures both grew Klebsiella bacteremia. Chest x-ray unremarkable. CT abdomen and pelvis negative for any acute abnormality done on admission. Received ceftriaxone  followed by cefazolin. Repeat blood cultures from 11/12 are negative so far. - Continue with cefazolin  New onset Afib with RVR- Early AM she had HR> 140, due to he borderline low BP, Amiodarone drip initiated. She is sinus this AM. Cardiology consult appreciated, continue Amio drip till surgery, no anticoagulation until Afib recurs, outpatient holter monitor  per cardiology. Continue telemetry.    Chronic hypoxic respiratory failure - Hypoxia, oxygen requirement worsened in the setting of trauma, pain.  She does have CHF. Continue supplemental oxygen, wean to home 1-2L O2. CT chest ruled out rib fractures. Continue bronchodilators, Lasix  home dose.   Chronic heart failure with preserved ejection fraction- Patient does have basilar rhonchi, rales, increased supplemental oxygen requirement. S/p IV lasix . decreased oral Lasix  to 20 mg daily. Follow daily renal function, electrolytes, weight, strict input and output.   COPD History of pulmonary fibrosis No exacerbation. Home o2 1-2L. Continue bronchodilators as needed.   Essential hypertension Blood pressure currently within goal. -Continue spironolactone    GERD Continue Protonix .   Hypothyroidism:  Continue home Synthroid .   CKD stage III A Kidney function stable. Avoid nephrotoxic drugs, hypertension, contrast. Monitor daily renal function.   Obesity class I Body mass index is 34.62 kg/m.  Diet, exercise and weight reduction advised. Placing the patient at high risk of poor outcome.  History of nosebleed. Continue humidity.   Subjective: Patient was seen and examined today.  Continued to have shoulder and hip pain.  She wants to have her surgery done as soon as possible.  Physical Exam: General.  Obese and frail elderly lady, in no acute distress. Pulmonary.  Lungs clear bilaterally, normal respiratory effort. CV.  Regular rate and rhythm, no JVD, rub or murmur. Abdomen.  Soft, nontender, nondistended, BS positive. CNS.  Alert and oriented .  No focal neurologic deficit. Extremities.  No edema,  pulses intact and symmetrical.  R UE in a sling Psychiatry.  Judgment and insight appears normal.   Data Reviewed: I have Reviewed nursing notes, Vitals, and Lab results.  Disposition: Status is: Inpatient Remains inpatient appropriate because: Awaiting culture clearance and  surgery  heparin injection 5,000 Units Start: 11/11/23 2200 SCDs Start: 11/07/23 0943   Family Communication: Discussed with husband at bedside  Level of care: Progressive   Vitals:   11/14/23 0403 11/14/23 0521 11/14/23 0738 11/14/23 1213  BP: (!) 125/40  (!) 118/48 (!) 124/43  Pulse: 77  83 72  Resp: 20  18 18   Temp: 98.7 F (37.1 C)  98 F (36.7 C) 98.2 F (36.8 C)  TempSrc: Oral  Oral Axillary  SpO2: 98%  98% 97%  Weight:  78.4 kg    Height:       This record has been created using Conservation officer, historic buildings. Errors have been sought and corrected,but may not always be located. Such creation errors do not reflect on the standard of care.   Author: Amaryllis Dare, MD 11/14/2023 2:59 PM  Please look on [email or web address] to find out who is on call.

## 2023-11-14 NOTE — Plan of Care (Signed)

## 2023-11-15 ENCOUNTER — Other Ambulatory Visit (HOSPITAL_COMMUNITY): Payer: Self-pay

## 2023-11-15 DIAGNOSIS — W1830XA Fall on same level, unspecified, initial encounter: Secondary | ICD-10-CM | POA: Diagnosis not present

## 2023-11-15 DIAGNOSIS — S42411A Displaced simple supracondylar fracture without intercondylar fracture of right humerus, initial encounter for closed fracture: Secondary | ICD-10-CM | POA: Diagnosis not present

## 2023-11-15 DIAGNOSIS — J9621 Acute and chronic respiratory failure with hypoxia: Secondary | ICD-10-CM | POA: Diagnosis not present

## 2023-11-15 DIAGNOSIS — I5033 Acute on chronic diastolic (congestive) heart failure: Secondary | ICD-10-CM | POA: Diagnosis not present

## 2023-11-15 NOTE — H&P (View-Only) (Signed)
   ORTHOPAEDIC PROGRESS NOTE   SUBJECTIVE: Reports continued pain in the right shoulder. Difficulty moving in any position. Surgery was now delayed due to blood cultures showing Gram negative rods. Plan for surgery on Monday as cultures are now negative.  OBJECTIVE: PE: RUE: TTP proximal humerus. ROM not tested in setting of known fracture. She endorses axillary nerve sensation which she states is preserved and symmetric. Was able to appreciate slight deltoid motor function. Distal motor and sensory function intact. Warm well perfused hand.   Vitals:   11/15/23 1157 11/15/23 1204  BP:  (!) 138/46  Pulse: 78 80  Resp:  18  Temp:  97.7 F (36.5 C)  SpO2: 97% 97%    Opiates Today (MME): Today's  total administered Morphine Milligram Equivalents: 17 Opiates Yesterday (MME): Yesterday's total administered Morphine Milligram Equivalents: 41 Opiates Used in the last two days:  Inpatient Morphine Milligram Equivalents Per Day 11/7 - 11/14   Values displayed are in units of MME/Day    Order Start / End Date 11/7 11/8 11/9 11/10 11/11 11/12  Yesterday Today    HYDROcodone -acetaminophen  (NORCO/VICODIN) 5-325 MG per tablet 1-2 tablet 11/6 - 11/10 0 of 30-60 0 of 30-60 5 of 35-70 0 of 10-20 -- -- -- --    morphine (PF) 4 MG/ML injection 4 mg 11/6 - 11/8 24 of 72 12 of 48 -- -- -- -- -- --    morphine (PF) 4 MG/ML injection 4 mg 11/10 - No end date -- -- -- 0 of 48 36 of 72 24 of 72 36 of 72 12 of 72    oxyCODONE  (Oxy IR/ROXICODONE ) immediate release tablet 5 mg 11/10 - 11/13 -- -- -- 7.5 of 30 0 of 45 0 of 45 0 of 22.5 --    traMADol  (ULTRAM ) tablet 50 mg 11/13 - No end date -- -- -- -- -- -- 5 of 10 5 of 20    Daily Totals  24 of 102-132 12 of 78-108 5 of 35-70 7.5 of 88-98 36 of 117 24 of 117 41 of 104.5 17 of 92       ASSESSMENT: Teresa Jensen is a 78 y.o. female   Right proximal humerus fracture:   Orthopedics recommends admission to a medical service and we will provide  consultation and follow along.   The risks benefits and alternatives were discussed with the patient including but not limited to the risks of nonoperative treatment, versus surgical intervention including infection, bleeding, nerve injury, periprosthetic fracture, the need for revision surgery blood clots, cardiopulmonary complications, morbidity, mortality, among others, and they were willing to proceed.     We additionally specifically discussed risks of axillary nerve injury, infection, periprosthetic fracture, continued pain and longevity of implants prior to beginning procedure.     This was discussed with the patient's son Teresa Jensen who is also in agreement with the treatment plan.     - Weight Bearing Status/Activity: NWB in sling - Right reverse total shoulder arthroplasty for fracture with Dr. Cristy on Monday 11/17 - NPO midnight Sunday   Right pubic rami fracture:  -WBAT  - non-operative management  Contact information:  Weekdays 8-5 Dr. Bonner Cristy, Aleck Stalling PA-C, After hours and holidays please check [email or web address] for group call information for [facility]  Aleck Stalling, PA-C 11/15/23

## 2023-11-15 NOTE — Progress Notes (Signed)
 Physical Therapy Treatment Patient Details Name: Teresa Jensen MRN: [id number] DOB: 04-Dec-1945 Today's Date: 11/15/2023   History of Present Illness Pt is a 78 y.o. female who presented 11/06/23 after fall. Xrays of the right shoulder demonstrate a displaced proximal humerus fracture. CT pelvis demonstrates right sided pubic rami fractures. Plan for right reverse TSA  (delayed) and non-op management of RLE. 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    PT Comments  Pt admitted with above diagnosis. Pt was able to tolerate standing to [facility] with mod assist of 2.  Stands with left UE in [place] with min assist of 2 for up to 30 seconds.  Fatigues quickly and limited by right LE pain.  Continue to follow acutely.  Pt currently with functional limitations due to the deficits listed below (see PT Problem List). Pt will benefit from acute skilled PT to increase their independence and safety with mobility to allow discharge.       If plan is discharge home, recommend the following: Two people to help with walking and/or transfers;A lot of help with bathing/dressing/bathroom;Assistance with cooking/housework;Assist for transportation;Help with stairs or ramp for entrance   Can travel by private vehicle     No  Equipment Recommendations  Hospital bed;Hoyer lift;Wheelchair (measurements PT);Wheelchair cushion (measurements PT)    Recommendations for Other Services       Precautions / Restrictions Precautions Precautions: Fall Recall of Precautions/Restrictions: Impaired Required Braces or Orthoses: Sling Restrictions Weight Bearing Restrictions Per Provider Order: No RUE Weight Bearing Per Provider Order: Non weight bearing RLE Weight  Bearing Per Provider Order: Weight bearing as tolerated     Mobility  Bed Mobility Overal bed mobility: Needs Assistance Bed Mobility: Supine to Sit     Supine to sit: HOB elevated, Mod assist, Used rails     General bed mobility comments: Used rail with LUE, able to bring LEs off bed with some support for RLE. Cues for sequencing, mod assist to support trunk and slide bed pad with pt to EOB.    Transfers Overall transfer level: Needs assistance Equipment used: Ambulation equipment used Transfers: Sit to/from Stand, Bed to chair/wheelchair/BSC Sit to Stand: Mod assist, +2 physical assistance, From elevated surface, Via lift equipment           General transfer comment: Mod assist of 2  for boost to stand from bed to [facility] with pt pulling up with left UE. Pt able to stand once up for up to 20 seconds.  Rested on buttocks pads and stood again with pt again making it up about 15 seconds due to right LE pain. Pt was able to weight shift bilaterally and lift bil feet up slightly each LE a few times. Moved pt to recliner in [place]. needs assist to control descent into chair. Transfer via Lift Equipment: Stedy  Ambulation/Gait               General Gait Details: Theme [employer] Manager Bed    Modified Rankin (Stroke Patients Only)       Balance Overall balance assessment: Needs assistance Sitting-balance support: Feet supported, No upper extremity supported Sitting balance-Leahy Scale: Fair Sitting balance - Comments: preference to LUE  support on rail. L lateral lean without UE support   Standing balance support: Single extremity supported, During functional activity Standing balance-Leahy Scale: Poor Standing balance comment: Stands statically with Single UE support.                            Communication Communication Communication: No apparent difficulties  Cognition Arousal: Alert Behavior During  Therapy: WFL for tasks assessed/performed, Anxious   PT - Cognitive impairments: No apparent impairments                       PT - Cognition Comments: Pt A,Ox4. Following commands: Intact Following commands impaired: Only follows one step commands consistently    Cueing Cueing Techniques: Verbal cues  Exercises General Exercises - Lower Extremity Ankle Circles/Pumps: AROM, Both, 10 reps, Seated Quad Sets: Strengthening, Both, 10 reps, Seated Gluteal Sets: Strengthening, Both, 10 reps, Seated Long Arc Quad: AROM, Both, 10 reps, Seated Heel Slides: AAROM, Both, 5 reps, Supine    General Comments General comments (skin integrity, edema, etc.): VSS on 2LO2      Pertinent Vitals/Pain Pain Assessment Pain Assessment: Faces Faces Pain Scale: Hurts whole lot Pain Location: R shoulder/upper arm and R hip Pain Descriptors / Indicators: Grimacing, Guarding, Discomfort, Aching Pain Intervention(s): Limited activity within patient's tolerance, Monitored during session, Repositioned, Premedicated before session    Home Living                          Prior Function            PT Goals (current goals can now be found in the care plan section) Progress towards PT goals: Progressing toward goals    Frequency    Min 2X/week      PT Plan      Co-evaluation              AM-PAC PT 6 Clicks Mobility   Outcome Measure  Help needed turning from your back to your side while in a flat bed without using bedrails?: A Lot Help needed moving from lying on your back to sitting on the side of a flat bed without using bedrails?: A Lot Help needed moving to and from a bed to a chair (including a wheelchair)?: Total Help needed standing up from a chair using your arms (e.g., wheelchair or bedside chair)?: Total Help needed to walk in hospital room?: Total Help needed climbing 3-5 steps with a railing? : Total 6 Click Score: 8    End of Session Equipment  Utilized During Treatment: Gait belt;Oxygen Activity Tolerance: Patient limited by pain;Patient limited by fatigue Patient left: in chair;with call bell/phone within reach;with chair alarm set;with family/visitor present Nurse Communication: Mobility status;Need for lift equipment Laurent for transfers) PT Visit Diagnosis: Pain;Difficulty in walking, not elsewhere classified (R26.2);Other abnormalities of gait and mobility (R26.89);Unsteadiness on feet (R26.81);Repeated falls (R29.6);Muscle weakness (generalized) (M62.81);History of falling (Z91.81) Pain - Right/Left: Right Pain - part of body: Shoulder;Hip     Time: 8576-8551 PT Time Calculation (min) (ACUTE ONLY): 25 min  Charges:    $Therapeutic Exercise: 8-22 mins $Therapeutic Activity: 8-22 mins PT General Charges $$ ACUTE PT VISIT: 1 Visit                     Marley Pakula M,PT Acute Rehab Services [phone]    Stephane JULIANNA Bevel 11/15/2023, 4:29 PM

## 2023-11-15 NOTE — Progress Notes (Signed)
   ORTHOPAEDIC PROGRESS NOTE   SUBJECTIVE: Reports continued pain in the right shoulder. Difficulty moving in any position. Surgery was now delayed due to blood cultures showing Gram negative rods. Plan for surgery on Monday as cultures are now negative.  OBJECTIVE: PE: RUE: TTP proximal humerus. ROM not tested in setting of known fracture. She endorses axillary nerve sensation which she states is preserved and symmetric. Was able to appreciate slight deltoid motor function. Distal motor and sensory function intact. Warm well perfused hand.   Vitals:   11/15/23 1157 11/15/23 1204  BP:  (!) 138/46  Pulse: 78 80  Resp:  18  Temp:  97.7 F (36.5 C)  SpO2: 97% 97%    Opiates Today (MME): Today's  total administered Morphine Milligram Equivalents: 17 Opiates Yesterday (MME): Yesterday's total administered Morphine Milligram Equivalents: 41 Opiates Used in the last two days:  Inpatient Morphine Milligram Equivalents Per Day 11/7 - 11/14   Values displayed are in units of MME/Day    Order Start / End Date 11/7 11/8 11/9 11/10 11/11 11/12  Yesterday Today    HYDROcodone -acetaminophen  (NORCO/VICODIN) 5-325 MG per tablet 1-2 tablet 11/6 - 11/10 0 of 30-60 0 of 30-60 5 of 35-70 0 of 10-20 -- -- -- --    morphine (PF) 4 MG/ML injection 4 mg 11/6 - 11/8 24 of 72 12 of 48 -- -- -- -- -- --    morphine (PF) 4 MG/ML injection 4 mg 11/10 - No end date -- -- -- 0 of 48 36 of 72 24 of 72 36 of 72 12 of 72    oxyCODONE  (Oxy IR/ROXICODONE ) immediate release tablet 5 mg 11/10 - 11/13 -- -- -- 7.5 of 30 0 of 45 0 of 45 0 of 22.5 --    traMADol  (ULTRAM ) tablet 50 mg 11/13 - No end date -- -- -- -- -- -- 5 of 10 5 of 20    Daily Totals  24 of 102-132 12 of 78-108 5 of 35-70 7.5 of 88-98 36 of 117 24 of 117 41 of 104.5 17 of 92       ASSESSMENT: Teresa Jensen is a 78 y.o. female   Right proximal humerus fracture:   Orthopedics recommends admission to a medical service and we will provide  consultation and follow along.   The risks benefits and alternatives were discussed with the patient including but not limited to the risks of nonoperative treatment, versus surgical intervention including infection, bleeding, nerve injury, periprosthetic fracture, the need for revision surgery blood clots, cardiopulmonary complications, morbidity, mortality, among others, and they were willing to proceed.     We additionally specifically discussed risks of axillary nerve injury, infection, periprosthetic fracture, continued pain and longevity of implants prior to beginning procedure.     This was discussed with the patient's son Izzie Geers who is also in agreement with the treatment plan.     - Weight Bearing Status/Activity: NWB in sling - Right reverse total shoulder arthroplasty for fracture with Dr. Cristy on Monday 11/17 - NPO midnight Sunday   Right pubic rami fracture:  -WBAT  - non-operative management  Contact information:  Weekdays 8-5 Dr. Bonner Cristy, Aleck Stalling PA-C, After hours and holidays please check [email or web address] for group call information for [facility]  Aleck Stalling, PA-C 11/15/23

## 2023-11-15 NOTE — Progress Notes (Signed)
  Brief Rounding Note:  11/15/23   Telemetry Reviewed:  Sinus  No Afib.    No charge.    Dr. Makayela Secrest

## 2023-11-15 NOTE — Progress Notes (Signed)
 [facility] Progress Note Patient: Teresa Jensen FMW:[id number] DOB: 10-Mar-1945  DOA: 11/06/2023 DOS: the patient was seen and examined on 11/15/2023  Brief Hospital Course: AMI MALLY is a 78 y.o. 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  Patient sustained right pubic ramus fracture with hematoma in pelvis, right humeral head fracture.  Trauma surgery cleared her, orthopedic consulted, admitted to [facility] service.  11/13: Hemodynamically stable, continue to have pain, repeat blood cultures negative in 1 day, likely will have her surgery on Monday per orthopedic surgery.  11/14: Hemodynamically stable, stating that morphine and Xanax  together help with pain.  Surgery now planned for Monday.  Repeat blood cultures remain negative.   Assessment and Plan: Status post fall, multiple injuries Right humerus fracture Closed pubic rami fracture Cleared by trauma service. Orthopedic surgery planning on ORIF right humerus on Monday now. Surgery now postponed due to bacteremia. - Continue with pain control and supportive care  Klebsiella bacteremia. Likely secondary to UTI as blood and urine cultures both grew Klebsiella bacteremia. Chest x-ray unremarkable. CT abdomen and pelvis negative for any acute abnormality done on admission. Received ceftriaxone  followed by cefazolin. Repeat blood cultures from 11/12 are negative so far. - Continue with cefazolin  New onset Afib with RVR- Early AM she had HR> 140, due to he borderline low BP, Amiodarone drip initiated. Amiodarone was  discontinued as she remained in sinus now. Cardiology consult appreciated, outpatient holter monitor per cardiology. Continue telemetry.    Chronic hypoxic respiratory failure - Hypoxia, oxygen requirement worsened in the setting of trauma, pain.  She does have CHF. Continue supplemental oxygen, -now back to baseline oxygen use. CT chest ruled out rib fractures. Continue bronchodilators, Lasix  home dose.   Chronic heart failure with preserved ejection fraction- Patient does have basilar rhonchi, rales, increased supplemental oxygen requirement. S/p IV lasix . decreased oral Lasix  to 20 mg daily. Follow daily renal function, electrolytes, weight, strict input and output.   COPD History of pulmonary fibrosis No exacerbation. Home o2 1-2L. Continue bronchodilators as needed.   Essential hypertension Blood pressure currently within goal. -Continue spironolactone    GERD Continue Protonix .   Hypothyroidism:  Continue home Synthroid .   CKD stage III A Kidney function stable. Avoid nephrotoxic drugs, hypertension, contrast. Monitor daily renal function.   Obesity class I Body mass index is 34.62 kg/m.  Diet, exercise and weight reduction advised. Placing the patient at high risk of poor outcome.  History of nosebleed. Continue humidity.   Subjective: Patient continued to have pain in her shoulder which increases significantly with any movement.  Per patient morphine and Xanax  together works better for her.  Physical Exam: General.  Frail and obese lady, in no acute distress. Pulmonary.  Lungs clear bilaterally, normal respiratory effort. CV.  Regular rate and rhythm, no JVD, rub or murmur. Abdomen.  Soft, nontender, nondistended, BS positive. CNS.  Alert and oriented .  No focal neurologic deficit. Extremities.  No edema,  pulses intact and symmetrical.  Right upper extremity in sling Psychiatry.  Judgment and insight appears normal.   Data Reviewed: I have Reviewed  nursing notes, Vitals, and Lab results.  Disposition: Status is: Inpatient Remains inpatient appropriate because: Awaiting culture clearance and surgery  heparin injection 5,000 Units Start: 11/11/23 2200 SCDs Start: 11/07/23 0943   Family Communication: Discussed with husband at bedside  Level of care: Progressive   Vitals:   11/15/23 0812 11/15/23 0922 11/15/23 1157 11/15/23 1204  BP:    (!) 138/46  Pulse:  88 78 80  Resp:  17  18  Temp:    97.7 F (36.5 C)  TempSrc:    Oral  SpO2: 99% 99% 97% 97%  Weight:      Height:       This record has been created using Conservation officer, historic buildings. Errors have been sought and corrected,but may not always be located. Such creation errors do not reflect on the standard of care.   Author: Amaryllis Dare, MD 11/15/2023 12:36 PM  Please look on [email or web address] to find out who is on call.

## 2023-11-15 NOTE — Progress Notes (Signed)
 Occupational Therapy Treatment Patient Details Name: Teresa Jensen MRN: [id number] DOB: Nov 30, 1945 Today's Date: 11/15/2023   History of present illness Pt is a 78 y.o. female who presented 11/06/23 after fall. Xrays of the right shoulder demonstrate a displaced proximal humerus fracture. CT pelvis demonstrates right sided pubic rami fractures. Plan for right reverse TSA  (delayed) and non-op management of RLE. 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   OT comments  OT session focused on training in techniques for increased safety and independence with ADLs and functional transfers during/in preparation for functional tasks. Pt currently demonstrating ability to complete UB dressing with Mod assist, Self-feeding with use of non-dominant Left hand with Set up, bed mobility with Mod to Max assist, and functional transfers with Mod assist +2 with use of Stedy, all while adhering to R UE NWB precautions and with R UE sling donned during self-feeding and all mobility. Pt participated well in session but was limited by fatigue and pain. Pt is making slow but consistent progress toward OT goals. Acute OT to continue to follow. Post acute discharge, pt will benefit from intensive inpatient skilled rehab services < 3 hours per day.       If plan is discharge home, recommend the following:  Two people to help with walking and/or transfers;A lot of help with bathing/dressing/bathroom;Assistance with cooking/housework;Assist for transportation;Help with stairs or ramp for entrance   Equipment Recommendations  BSC/3in1    Recommendations for Other Services      Precautions / Restrictions Precautions Precautions: Fall Recall of  Precautions/Restrictions: Impaired Required Braces or Orthoses: Sling Restrictions Weight Bearing Restrictions Per Provider Order: Yes RUE Weight Bearing Per Provider Order: Non weight bearing RLE Weight Bearing Per Provider Order: Weight bearing as tolerated       Mobility Bed Mobility Overal bed mobility: Needs Assistance Bed Mobility: Sit to Supine     Supine to sit: Mod assist, Max assist, HOB elevated, Used rails     General bed mobility comments: Used rail with LUE. Cues for sequencing, mod assist to support trunk and Max assist to elevate B LE into bed.    Transfers Overall transfer level: Needs assistance Equipment used: Ambulation equipment used Transfers: Sit to/from Stand, Bed to chair/wheelchair/BSC Sit to Stand: Mod assist, +2 physical assistance, Via lift equipment           General transfer comment: Mod assist of 2  for boost to stand from recliner to [facility] with pt pulling up on Stedy handrail with Left UE. Pt able to stand once up for up to 30 seconds x2. Rested on buttocks pads between stands. Moved pt from recliner to bed in [place]. Pt needs assist to control descent into sitting at EOB. Transfer via Lift Equipment: Stedy   Balance Overall balance assessment: Needs assistance Sitting-balance support: No upper extremity supported, Feet supported Sitting balance-Leahy Scale: Fair Sitting balance - Comments: preference to LUE support on rail. L lateral lean without UE support   Standing balance support: Single extremity supported, During functional activity, Reliant on assistive device for balance Standing balance-Leahy Scale: Poor Standing balance comment: Stands statically with Single UE support.                           ADL either  performed or assessed with clinical judgement   ADL Overall ADL's : Needs assistance/impaired Eating/Feeding: Set up;Sitting Eating/Feeding Details (indicate cue type and reason): for drinking from a cup with  straw             Upper Body Dressing : Moderate assistance;Sitting                          Extremity/Trunk Assessment Upper Extremity Assessment Upper Extremity Assessment: Right hand dominant;RUE deficits/detail RUE Deficits / Details: R humerus fx, in sling. NWB; plans for surgery early next week RUE Coordination: decreased gross motor;decreased fine motor   Lower Extremity Assessment Lower Extremity Assessment: Defer to PT evaluation        Vision       Perception     Praxis     Communication Communication Communication: No apparent difficulties   Cognition Arousal: Alert Behavior During Therapy: WFL for tasks assessed/performed, Anxious Cognition: Cognition impaired     Awareness: Intellectual awareness intact, Online awareness intact Memory impairment (select all impairments): Short-term memory Attention impairment (select first level of impairment): Alternating attention Executive functioning impairment (select all impairments): Reasoning, Problem solving OT - Cognition Comments: Pt AAOx4 and pleasant throughout session. Pt cognition largely [facility] but with noted mild short-term memory and executive functioning deficits.                 Following commands: Impaired Following commands impaired: Only follows one step commands consistently, Follows multi-step commands inconsistently, Follows multi-step commands with increased time      Cueing   Cueing Techniques: Verbal cues, Visual cues  Exercises      Shoulder Instructions       General Comments VSS on 2L continuous O2 through nasal cannula. Pt's husband present and supportive throughout session. RN present and assisting through majority of session.    Pertinent Vitals/ Pain       Pain Assessment Pain Assessment: Faces Faces Pain Scale: Hurts whole lot Pain Location: R shoulder/upper arm and R hip Pain Descriptors / Indicators: Grimacing, Guarding, Discomfort, Aching, Moaning Pain  Intervention(s): Limited activity within patient's tolerance, Monitored during session, Repositioned, Patient requesting pain meds-RN notified  Home Living                                          Prior Functioning/Environment              Frequency  Min 2X/week        Progress Toward Goals  OT Goals(current goals can now be found in the care plan section)  Progress towards OT goals: Progressing toward goals  Acute Rehab OT Goals Patient Stated Goal: to have less pain  Plan      Co-evaluation                 AM-PAC OT 6 Clicks Daily Activity     Outcome Measure   Help from another person eating meals?: A Little Help from another person taking care of personal grooming?: A Little Help from another person toileting, which includes using toliet, bedpan, or urinal?: Total Help from another person bathing (including washing, rinsing, drying)?: A Lot Help from another person to put on and taking off regular upper body clothing?: A Lot Help from another person to put on and taking off regular lower body clothing?: Total 6 Click Score: 12  End of Session Equipment Utilized During Treatment: Oxygen;Other (comment);Gait belt (R UE sling; Stedy)  OT Visit Diagnosis: Other abnormalities of gait and mobility (R26.89);Muscle weakness (generalized) (M62.81);Pain;History of falling (Z91.81) Pain - Right/Left: Right Pain - part of body: Shoulder;Arm;Hip   Activity Tolerance Patient tolerated treatment well;Patient limited by pain;Patient limited by fatigue   Patient Left in bed;with call bell/phone within reach;with bed alarm set;with family/visitor present   Nurse Communication Mobility status;Precautions;Patient requests pain meds;Weight bearing status (RN present and assisting with mobility through majority of session)        Time: 8462-8443 OT Time Calculation (min): 19 min  Charges: OT General Charges $OT Visit: 1 Visit OT  Treatments $Self Care/Home Management : 8-22 mins  Margarie Rockey HERO., OTR/L, MA Acute Rehab [phone]   [name] 11/15/2023, 7:16 PM

## 2023-11-15 NOTE — Plan of Care (Signed)
  Problem: Education: Goal: Knowledge of General Education information will improve Description: Including pain rating scale, medication(s)/side effects and non-pharmacologic comfort measures Outcome: Progressing   Problem: Activity: Goal: Risk for activity intolerance will decrease Outcome: Progressing. Pt out of bed to recliner and back after dinner.

## 2023-11-16 DIAGNOSIS — W1830XA Fall on same level, unspecified, initial encounter: Secondary | ICD-10-CM | POA: Diagnosis not present

## 2023-11-16 DIAGNOSIS — J9621 Acute and chronic respiratory failure with hypoxia: Secondary | ICD-10-CM | POA: Diagnosis not present

## 2023-11-16 DIAGNOSIS — S42411A Displaced simple supracondylar fracture without intercondylar fracture of right humerus, initial encounter for closed fracture: Secondary | ICD-10-CM | POA: Diagnosis not present

## 2023-11-16 DIAGNOSIS — I5033 Acute on chronic diastolic (congestive) heart failure: Secondary | ICD-10-CM | POA: Diagnosis not present

## 2023-11-16 MED ORDER — LACTULOSE 10 GM/15ML PO SOLN
20.0000 g | Freq: Two times a day (BID) | ORAL | Status: DC
  Administered 2023-11-16 – 2023-11-19 (×3): 20 g via ORAL
  Filled 2023-11-16 (×7): qty 30

## 2023-11-16 NOTE — Plan of Care (Signed)
  Problem: Education: Goal: Knowledge of General Education information will improve Description: Including pain rating scale, medication(s)/side effects and non-pharmacologic comfort measures Outcome: Progressing   Problem: Health Behavior/Discharge Planning: Goal: Ability to manage health-related needs will improve Outcome: Progressing   Problem: Clinical Measurements: Goal: Ability to maintain clinical measurements within normal limits will improve Outcome: Progressing   Problem: Nutrition: Goal: Adequate nutrition will be maintained Outcome: Progressing   Problem: Skin Integrity: Goal: Risk for impaired skin integrity will decrease Outcome: Progressing   

## 2023-11-16 NOTE — Progress Notes (Signed)
 [facility] Progress Note Patient: Teresa Jensen FMW:[id number] DOB: [birth year]  DOA: 11/06/2023 DOS: the patient was seen and examined on 11/16/2023  Brief Hospital Course: Teresa Jensen is a 78 y.o. 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  Patient sustained right pubic ramus fracture with hematoma in pelvis, right humeral head fracture.  Trauma surgery cleared her, orthopedic consulted, admitted to TRH service.  11/13: Hemodynamically stable, continue to have pain, repeat blood cultures negative in 1 day, likely will have her surgery on Monday per orthopedic surgery.  11/14: Hemodynamically stable, stating that morphine and Xanax  together help with pain.  Surgery now planned for Monday.  Repeat blood cultures remain negative.  11/15: Hemodynamically stable, had a BM.  Pain bearable, surgery on Monday.  Repeat blood cultures remain negative   Assessment and Plan: Status post fall, multiple injuries Right humerus fracture Closed pubic rami fracture Cleared by trauma service. Orthopedic surgery planning on ORIF right humerus on Monday now. Surgery now postponed due to bacteremia. - Continue with pain control and supportive care  Klebsiella bacteremia. Likely secondary to UTI as blood and urine cultures both grew Klebsiella bacteremia. Chest x-ray unremarkable. CT abdomen and pelvis negative for any acute abnormality done on admission. Received ceftriaxone  followed by cefazolin. Repeat blood cultures from 11/12 are negative so far. - Continue with cefazolin  New  onset Afib with RVR- Early AM she had HR> 140, due to he borderline low BP, Amiodarone drip initiated. Amiodarone was discontinued as she remained in sinus now. Cardiology consult appreciated, outpatient holter monitor per cardiology. Continue telemetry.    Chronic hypoxic respiratory failure - Hypoxia, oxygen requirement worsened in the setting of trauma, pain.  She does have CHF. Continue supplemental oxygen, -now back to baseline oxygen use. CT chest ruled out rib fractures. Continue bronchodilators, Lasix  home dose.   Chronic heart failure with preserved ejection fraction- Patient does have basilar rhonchi, rales, increased supplemental oxygen requirement. S/p IV lasix . decreased oral Lasix  to 20 mg daily. Follow daily renal function, electrolytes, weight, strict input and output.   COPD History of pulmonary fibrosis No exacerbation. Home o2 1-2L. Continue bronchodilators as needed.   Essential hypertension Blood pressure currently within goal. -Continue spironolactone    GERD Continue Protonix .   Hypothyroidism:  Continue home Synthroid .   CKD stage III A Kidney function stable. Avoid nephrotoxic drugs, hypertension, contrast. Monitor daily renal function.   Obesity class I Body mass index is 34.62 kg/m.  Diet, exercise and weight reduction advised. Placing the patient at high risk of poor outcome.  History of nosebleed. Continue humidity.   Subjective: Patient was seen and examined today.  Pain today bearable.  Had a good bowel movement so feeling much relieved.  Physical Exam: General.  Obese and frail elderly lady, in no acute distress. Pulmonary.  Lungs clear bilaterally, normal respiratory effort. CV.  Regular rate and rhythm, no JVD, rub or murmur. Abdomen.  Soft, nontender, nondistended, BS positive. CNS.  Alert and oriented .  No focal neurologic deficit. Extremities.  No edema, no cyanosis, pulses intact and symmetrical.  Right upper extremity in  sling Psychiatry.  Judgment and insight  appears normal.    Data Reviewed: I have Reviewed nursing notes, Vitals, and Lab results.  Disposition: Status is: Inpatient Remains inpatient appropriate because: Awaiting culture clearance and surgery  heparin injection 5,000 Units Start: 11/11/23 2200 SCDs Start: 11/07/23 0943   Family Communication: Discussed with husband at bedside  Level of care: Progressive   Vitals:   11/15/23 2330 11/16/23 0319 11/16/23 0750 11/16/23 1131  BP: (!) 133/47 (!) 131/45 (!) 130/43 (!) 116/43  Pulse: 74 82 75 68  Resp: 15 17 18 19   Temp:  97.6 F (36.4 C) 98.2 F (36.8 C) 98 F (36.7 C)  TempSrc:  Oral Oral Oral  SpO2: 95% 96% 100% 99%  Weight:      Height:       This record has been created using Conservation officer, historic buildings. Errors have been sought and corrected,but may not always be located. Such creation errors do not reflect on the standard of care.   Author: Amaryllis Dare, MD 11/16/2023 4:04 PM  Please look on [email or web address] to find out who is on call.

## 2023-11-17 NOTE — Plan of Care (Signed)
  Problem: Education: Goal: Knowledge of General Education information will improve Description: Including pain rating scale, medication(s)/side effects and non-pharmacologic comfort measures Outcome: Progressing   Problem: Clinical Measurements: Goal: Ability to maintain clinical measurements within normal limits will improve Outcome: Progressing Goal: Will remain free from infection Outcome: Progressing Goal: Respiratory complications will improve Outcome: Progressing Goal: Cardiovascular complication will be avoided Outcome: Progressing   Problem: Activity: Goal: Risk for activity intolerance will decrease Outcome: Progressing   Problem: Nutrition: Goal: Adequate nutrition will be maintained Outcome: Progressing   Problem: Coping: Goal: Level of anxiety will decrease Outcome: Progressing   Problem: Elimination: Goal: Will not experience complications related to bowel motility Outcome: Progressing Goal: Will not experience complications related to urinary retention Outcome: Progressing   Problem: Pain Managment: Goal: General experience of comfort will improve and/or be controlled Outcome: Progressing   Problem: Safety: Goal: Ability to remain free from injury will improve Outcome: Progressing   Problem: Skin Integrity: Goal: Risk for impaired skin integrity will decrease Outcome: Progressing

## 2023-11-17 NOTE — Progress Notes (Signed)
 [facility] Progress Note Patient: Teresa Jensen FMW:[id number] DOB: 30-Mar-1945  DOA: 11/06/2023 DOS: the patient was seen and examined on 11/17/2023  Brief Hospital Course: Teresa Jensen is a 78 y.o. 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  Patient sustained right pubic ramus fracture with hematoma in pelvis, right humeral head fracture.  Trauma surgery cleared her, orthopedic consulted, admitted to TRH service.  11/13: Hemodynamically stable, continue to have pain, repeat blood cultures negative in 1 day, likely will have her surgery on Monday per orthopedic surgery.  11/14: Hemodynamically stable, stating that morphine and Xanax  together help with pain.  Surgery now planned for Monday.  Repeat blood cultures remain negative.  11/15: Hemodynamically stable, had a BM.  Pain bearable, surgery on Monday.  Repeat blood cultures remain negative  11/16 pain is off and on, surgery scheduled on Monday.   Assessment and Plan: Status post fall, multiple injuries Right humerus fracture Closed pubic rami fracture Cleared by trauma service. Orthopedic surgery planning on ORIF right humerus on Monday Surgery was postponed due to bacteremia. - Continue with pain control and supportive care  Klebsiella bacteremia. Likely secondary to UTI as blood and urine cultures both grew Klebsiella bacteremia. Chest x-ray unremarkable. CT abdomen and pelvis negative for any acute abnormality done on admission. Received ceftriaxone  followed by cefazolin. Repeat blood cultures from 11/12 are  negative so far. - Continue with cefazolin  New onset Afib with RVR- Early AM she had HR> 140, due to he borderline low BP, Amiodarone drip initiated. Amiodarone was discontinued as she remained in sinus now. Cardiology consult appreciated, outpatient holter monitor per cardiology. Continue telemetry.    Chronic hypoxic respiratory failure - Hypoxia, oxygen requirement worsened in the setting of trauma, pain.  She does have CHF. Continue supplemental oxygen, -now back to baseline oxygen use. CT chest ruled out rib fractures. Continue bronchodilators, Lasix  home dose.   Chronic heart failure with preserved ejection fraction- Patient does have basilar rhonchi, rales, increased supplemental oxygen requirement. S/p IV lasix . decreased oral Lasix  to 20 mg daily. Follow daily renal function, electrolytes, weight, strict input and output.   COPD History of pulmonary fibrosis No exacerbation. Home o2 1-2L. Continue bronchodilators as needed.   Essential hypertension Blood pressure currently within goal. -Continue spironolactone    GERD Continue Protonix .   Hypothyroidism:  Continue home Synthroid .   CKD stage III A Kidney function stable. Avoid nephrotoxic drugs, hypertension, contrast. Monitor daily renal function.   Obesity class I Body mass index is 34.62 kg/m.  Diet, exercise and weight reduction advised. Placing the patient at high risk of poor outcome.  History of nosebleed. Continue humidity.   Subjective: Patient was seen and examined today.  Right arm pain is off and on, overall she is feeling better, denied any complaints. Patient was informed that the surgery is scheduled tomorrow.   Physical Exam: General.  Obese and frail elderly lady, in no acute distress. Pulmonary.  Lungs clear bilaterally, normal respiratory effort. CV.  Regular rate and rhythm, no JVD, rub or murmur. Abdomen.  Soft, nontender, nondistended, BS positive. CNS.  Alert and oriented .  No  focal neurologic  deficit. Extremities.  No edema, no cyanosis, pulses intact and symmetrical.  Right upper extremity in sling Psychiatry.  Judgment and insight appears normal.    Data Reviewed: I have Reviewed nursing notes, Vitals, and Lab results.  Disposition: Status is: Inpatient Remains inpatient appropriate because: Awaiting culture clearance and surgery  heparin injection 5,000 Units Start: 11/11/23 2200 SCDs Start: 11/07/23 0943   Family Communication: Discussed with husband at bedside  Level of care: Progressive   Vitals:   11/16/23 2304 11/17/23 0334 11/17/23 0758 11/17/23 1257  BP: (!) 133/40 (!) 143/50 (!) 137/48   Pulse: 80 72 74 93  Resp: 20 20 20    Temp: 98.2 F (36.8 C) 97.9 F (36.6 C) 97.8 F (36.6 C)   TempSrc: Oral Oral Oral   SpO2: 100% 95% 99% 98%  Weight:      Height:       This record has been created using Conservation officer, historic buildings. Errors have been sought and corrected,but may not always be located. Such creation errors do not reflect on the standard of care.   Author: Elvan Sor, MD 11/17/2023 2:28 PM  Please look on [email or web address] to find out who is on call.

## 2023-11-17 NOTE — TOC Progression Note (Signed)
 Transition of Care [facility]) - Progression Note    Patient Details  Name: Teresa Jensen MRN: [id number] Date of Birth: 04-24-1945  [facility] CM/SW Contact  Isaiah Public, LCSWA Phone Number: 11/17/2023, 11:29 AM  Clinical Narrative:     CSW following to confirm SNF bed availability at [facility] closer to patient being medically ready for dc.  Expected Discharge Plan: Skilled Nursing Facility Barriers to Discharge: Continued Medical Work up               Expected Discharge Plan and Services In-house Referral: Clinical Social Work     Living arrangements for the past 2 months: Single Family Home                                       Social Drivers of Health (SDOH) Interventions SDOH Screenings   Food Insecurity: Patient Declined (11/08/2023)  Housing: Unknown (11/08/2023)  Transportation Needs: Patient Declined (11/08/2023)  Utilities: Patient Declined (11/08/2023)  Alcohol Screen: Low Risk  (05/07/2022)  Depression (PHQ2-9): Low Risk  (08/13/2023)  Financial Resource Strain: Patient Declined (05/01/2023)  Physical Activity: Sufficiently Active (05/01/2023)  Social Connections: Unknown (11/08/2023)  Stress: No Stress Concern Present (05/01/2023)  Tobacco Use: Medium Risk (11/06/2023)  Health Literacy: Adequate Health Literacy (05/08/2023)    Readmission Risk Interventions     No data to display

## 2023-11-17 NOTE — Progress Notes (Signed)
   11/17/23 2050  Spiritual Encounters  Type of Visit Attempt (pt unavailable)  Referral source Patient request  Reason for visit Surgical  OnCall Visit Yes    Pt was unavailable, however chaplain will follow up again. Please [place] chaplain prior to procedure if we haven't stopped back by that time [phone]).

## 2023-11-18 ENCOUNTER — Inpatient Hospital Stay (HOSPITAL_COMMUNITY)

## 2023-11-18 ENCOUNTER — Other Ambulatory Visit: Payer: Self-pay

## 2023-11-18 ENCOUNTER — Inpatient Hospital Stay (HOSPITAL_COMMUNITY): Payer: Self-pay | Admitting: Anesthesiology

## 2023-11-18 ENCOUNTER — Encounter (HOSPITAL_COMMUNITY): Payer: Self-pay | Admitting: Internal Medicine

## 2023-11-18 ENCOUNTER — Encounter (HOSPITAL_COMMUNITY)
Admission: EM | Disposition: A | Discharge status: Skilled Nursing Facility | Payer: Self-pay | Source: Home / Self Care | Attending: Internal Medicine

## 2023-11-18 DIAGNOSIS — I10 Essential (primary) hypertension: Secondary | ICD-10-CM | POA: Diagnosis not present

## 2023-11-18 DIAGNOSIS — I4891 Unspecified atrial fibrillation: Secondary | ICD-10-CM

## 2023-11-18 DIAGNOSIS — S32599A Other specified fracture of unspecified pubis, initial encounter for closed fracture: Secondary | ICD-10-CM | POA: Diagnosis not present

## 2023-11-18 DIAGNOSIS — S42201A Unspecified fracture of upper end of right humerus, initial encounter for closed fracture: Secondary | ICD-10-CM | POA: Diagnosis not present

## 2023-11-18 DIAGNOSIS — E66812 Obesity, class 2: Secondary | ICD-10-CM

## 2023-11-18 DIAGNOSIS — I2510 Atherosclerotic heart disease of native coronary artery without angina pectoris: Secondary | ICD-10-CM

## 2023-11-18 DIAGNOSIS — S42411A Displaced simple supracondylar fracture without intercondylar fracture of right humerus, initial encounter for closed fracture: Secondary | ICD-10-CM | POA: Diagnosis not present

## 2023-11-18 DIAGNOSIS — I130 Hypertensive heart and chronic kidney disease with heart failure and stage 1 through stage 4 chronic kidney disease, or unspecified chronic kidney disease: Secondary | ICD-10-CM | POA: Diagnosis not present

## 2023-11-18 DIAGNOSIS — I5022 Chronic systolic (congestive) heart failure: Secondary | ICD-10-CM

## 2023-11-18 DIAGNOSIS — N183 Chronic kidney disease, stage 3 unspecified: Secondary | ICD-10-CM

## 2023-11-18 DIAGNOSIS — N390 Urinary tract infection, site not specified: Secondary | ICD-10-CM

## 2023-11-18 HISTORY — PX: REVERSE SHOULDER ARTHROPLASTY: SHX5054

## 2023-11-18 LAB — CBC
HCT: 30.1 % — ABNORMAL LOW (ref 36.0–46.0)
Hemoglobin: 9.2 g/dL — ABNORMAL LOW (ref 12.0–15.0)
MCH: 29.6 pg (ref 26.0–34.0)
MCHC: 30.6 g/dL (ref 30.0–36.0)
MCV: 96.8 fL (ref 80.0–100.0)
Platelets: 446 K/uL — ABNORMAL HIGH (ref 150–400)
RBC: 3.11 MIL/uL — ABNORMAL LOW (ref 3.87–5.11)
RDW: 13.9 % (ref 11.5–15.5)
WBC: 10.7 K/uL — ABNORMAL HIGH (ref 4.0–10.5)
nRBC: 0 % (ref 0.0–0.2)

## 2023-11-18 LAB — BASIC METABOLIC PANEL WITH GFR
Anion gap: 13 (ref 5–15)
BUN: 16 mg/dL (ref 8–23)
CO2: 23 mmol/L (ref 22–32)
Calcium: 8.3 mg/dL — ABNORMAL LOW (ref 8.9–10.3)
Chloride: 101 mmol/L (ref 98–111)
Creatinine, Ser: 0.86 mg/dL (ref 0.44–1.00)
GFR, Estimated: >60 mL/min (ref >60)
Glucose, Bld: 72 mg/dL (ref 70–99)
Potassium: 4.5 mmol/L (ref 3.5–5.1)
Sodium: 137 mmol/L (ref 135–145)

## 2023-11-18 LAB — MAGNESIUM: Magnesium: 2.1 mg/dL (ref 1.7–2.4)

## 2023-11-18 LAB — CULTURE, BLOOD (ROUTINE X 2)
Culture: NO GROWTH
Culture: NO GROWTH
Special Requests: ADEQUATE

## 2023-11-18 LAB — VITAMIN B12: Vitamin B-12: 638 pg/mL (ref 180–914)

## 2023-11-18 LAB — PHOSPHORUS: Phosphorus: 3.8 mg/dL (ref 2.5–4.6)

## 2023-11-18 LAB — IRON AND TIBC
Iron: 43 ug/dL (ref 28–170)
Saturation Ratios: 18 % (ref 10.4–31.8)
TIBC: 241 ug/dL — ABNORMAL LOW (ref 250–450)
UIBC: 198 ug/dL

## 2023-11-18 LAB — FOLATE: Folate: 13.1 ng/mL (ref >5.9)

## 2023-11-18 LAB — VITAMIN D 25 HYDROXY (VIT D DEFICIENCY, FRACTURES): Vit D, 25-Hydroxy: 44.4 ng/mL (ref 30–100)

## 2023-11-18 SURGERY — ARTHROPLASTY, SHOULDER, TOTAL, REVERSE
Anesthesia: General | Site: Shoulder | Laterality: Right

## 2023-11-18 MED ORDER — PROPOFOL 10 MG/ML IV BOLUS
INTRAVENOUS | Status: AC
  Filled 2023-11-18: qty 20

## 2023-11-18 MED ORDER — OXYCODONE HCL 5 MG/5ML PO SOLN: 5.0000 mg | Freq: Once | ORAL | Status: DC | PRN

## 2023-11-18 MED ORDER — OXYCODONE HCL 5 MG PO TABS: 5.0000 mg | ORAL_TABLET | Freq: Once | ORAL | Status: DC | PRN

## 2023-11-18 MED ORDER — CEFAZOLIN SODIUM-DEXTROSE 2-3 GM-%(50ML) IV SOLR
INTRAVENOUS | Status: DC | PRN
  Administered 2023-11-18: 2 g via INTRAVENOUS

## 2023-11-18 MED ORDER — FENTANYL CITRATE (PF) 100 MCG/2ML IJ SOLN: 50.0000 ug | Freq: Once | INTRAMUSCULAR | Status: AC

## 2023-11-18 MED ORDER — VANCOMYCIN HCL 1000 MG IV SOLR
INTRAVENOUS | Status: DC | PRN
  Administered 2023-11-18: 1000 mg via TOPICAL

## 2023-11-18 MED ORDER — AMISULPRIDE (ANTIEMETIC) 5 MG/2ML IV SOLN: 10.0000 mg | Freq: Once | INTRAVENOUS | Status: DC | PRN

## 2023-11-18 MED ORDER — ORAL CARE MOUTH RINSE: 15.0000 mL | Freq: Once | OROMUCOSAL | Status: AC

## 2023-11-18 MED ORDER — LIDOCAINE 2% (20 MG/ML) 5 ML SYRINGE
INTRAMUSCULAR | Status: DC | PRN
Start: 2023-11-18 — End: 2023-11-18
  Administered 2023-11-18: 40 mg via INTRAVENOUS

## 2023-11-18 MED ORDER — PROPOFOL 10 MG/ML IV BOLUS
INTRAVENOUS | Status: DC | PRN
Start: 2023-11-18 — End: 2023-11-18
  Administered 2023-11-18: 150 mg via INTRAVENOUS

## 2023-11-18 MED ORDER — PHENYLEPHRINE 80 MCG/ML (10ML) SYRINGE FOR IV PUSH (FOR BLOOD PRESSURE SUPPORT)
PREFILLED_SYRINGE | INTRAVENOUS | Status: DC | PRN
Start: 2023-11-18 — End: 2023-11-18
  Administered 2023-11-18: 80 ug via INTRAVENOUS

## 2023-11-18 MED ORDER — ONDANSETRON HCL 4 MG/2ML IJ SOLN
INTRAMUSCULAR | Status: DC | PRN
  Administered 2023-11-18: 4 mg via INTRAVENOUS

## 2023-11-18 MED ORDER — CHLORHEXIDINE GLUCONATE 0.12 % MT SOLN: 15.0000 mL | Freq: Once | OROMUCOSAL | Status: AC

## 2023-11-18 MED ORDER — SUGAMMADEX SODIUM 200 MG/2ML IV SOLN
INTRAVENOUS | Status: DC | PRN
  Administered 2023-11-18: 200 mg via INTRAVENOUS

## 2023-11-18 MED ORDER — FENTANYL CITRATE (PF) 100 MCG/2ML IJ SOLN
INTRAMUSCULAR | Status: AC
  Administered 2023-11-18: 50 ug via INTRAVENOUS
  Filled 2023-11-18: qty 2

## 2023-11-18 MED ORDER — VANCOMYCIN HCL 1000 MG IV SOLR
INTRAVENOUS | Status: AC
  Filled 2023-11-18: qty 20

## 2023-11-18 MED ORDER — LACTATED RINGERS IV SOLN: INTRAVENOUS | Status: DC

## 2023-11-18 MED ORDER — FENTANYL CITRATE (PF) 100 MCG/2ML IJ SOLN
INTRAMUSCULAR | Status: AC
  Filled 2023-11-18: qty 2

## 2023-11-18 MED ORDER — BEVESPI AEROSPHERE 9-4.8 MCG/ACT IN AERO: 2.0000 | INHALATION_SPRAY | Freq: Two times a day (BID) | RESPIRATORY_TRACT | 3 refills | Status: AC

## 2023-11-18 MED ORDER — FENTANYL CITRATE (PF) 100 MCG/2ML IJ SOLN
25.0000 ug | INTRAMUSCULAR | Status: DC | PRN
  Administered 2023-11-18: 50 ug via INTRAVENOUS
  Administered 2023-11-18 (×2): 25 ug via INTRAVENOUS

## 2023-11-18 MED ORDER — ROCURONIUM BROMIDE 10 MG/ML (PF) SYRINGE
PREFILLED_SYRINGE | INTRAVENOUS | Status: DC | PRN
Start: 2023-11-18 — End: 2023-11-18
  Administered 2023-11-18: 60 mg via INTRAVENOUS
  Administered 2023-11-18: 40 mg via INTRAVENOUS

## 2023-11-18 MED ORDER — CHLORHEXIDINE GLUCONATE 0.12 % MT SOLN
OROMUCOSAL | Status: AC
  Administered 2023-11-18: 15 mL via OROMUCOSAL
  Filled 2023-11-18: qty 15

## 2023-11-18 MED ORDER — CEFAZOLIN SODIUM-DEXTROSE 2-4 GM/100ML-% IV SOLN
INTRAVENOUS | Status: AC
  Filled 2023-11-18: qty 100

## 2023-11-18 MED ORDER — BUPIVACAINE HCL (PF) 0.25 % IJ SOLN
INTRAMUSCULAR | Status: DC | PRN
  Administered 2023-11-18: 20 mL via PERINEURAL

## 2023-11-18 MED ORDER — ACETAMINOPHEN 500 MG PO TABS
1000.0000 mg | ORAL_TABLET | Freq: Once | ORAL | Status: AC
  Administered 2023-11-18: 1000 mg via ORAL

## 2023-11-18 MED ORDER — PHENYLEPHRINE HCL-NACL 20-0.9 MG/250ML-% IV SOLN
INTRAVENOUS | Status: DC | PRN
  Administered 2023-11-18: 20 ug/min via INTRAVENOUS

## 2023-11-18 SURGICAL SUPPLY — 53 items
BAG COUNTER SPONGE SURGICOUNT (BAG) ×1 IMPLANT
BASEPLATE GLENOID RSA 3X25 0D (Shoulder) IMPLANT
BIT DRILL 3.2 PERIPHERAL SCREW (BIT) IMPLANT
BLADE SAW SGTL 73X25 THK (BLADE) ×1 IMPLANT
CHLORAPREP W/TINT 26 (MISCELLANEOUS) ×2 IMPLANT
COVER SURGICAL LIGHT HANDLE (MISCELLANEOUS) ×1 IMPLANT
DRAPE C-ARM 42X72 X-RAY (DRAPES) IMPLANT
DRAPE HALF SHEET 40X57 (DRAPES) ×1 IMPLANT
DRAPE INCISE IOBAN 66X45 STRL (DRAPES) ×2 IMPLANT
DRAPE SURG ORHT 6 SPLT 77X108 (DRAPES) ×2 IMPLANT
DRAPE SWITCH (DRAPES) ×1 IMPLANT
DRAPE U-SHAPE 47X51 STRL (DRAPES) IMPLANT
DRESSING AQUACEL AG SP 3.5X6 (GAUZE/BANDAGES/DRESSINGS) IMPLANT
DRSG AQUACEL AG ADV 3.5X 6 (GAUZE/BANDAGES/DRESSINGS) ×1 IMPLANT
ELECTRODE REM PT RTRN 9FT ADLT (ELECTROSURGICAL) ×1 IMPLANT
GLOVE BIO SURGEON STRL SZ 6.5 (GLOVE) ×1 IMPLANT
GLOVE BIOGEL PI IND STRL 8 (GLOVE) ×1 IMPLANT
GLOVE ECLIPSE 8.0 STRL XLNG CF (GLOVE) ×2 IMPLANT
GLOVE INDICATOR 6.5 STRL GRN (GLOVE) ×1 IMPLANT
GOWN STRL REUS W/ TWL LRG LVL3 (GOWN DISPOSABLE) ×1 IMPLANT
GOWN STRL REUS W/ TWL XL LVL3 (GOWN DISPOSABLE) IMPLANT
GUIDEWIRE GLENOID 2.5X220 (WIRE) IMPLANT
HEAD CANN REV SHLD STD 36 (Shoulder) IMPLANT
INSERT HUM REV VE PERF 3/4 +3 (Insert) IMPLANT
KIT BASIN OR (CUSTOM PROCEDURE TRAY) ×1 IMPLANT
KIT STABILIZATION SHOULDER (MISCELLANEOUS) ×1 IMPLANT
KIT TURNOVER KIT B (KITS) ×1 IMPLANT
MANIFOLD NEPTUNE II (INSTRUMENTS) ×1 IMPLANT
NDL HYPO 25GX1X1/2 BEV (NEEDLE) IMPLANT
NDL MAYO TROCAR (NEEDLE) IMPLANT
NEEDLE HYPO 25GX1X1/2 BEV (NEEDLE) IMPLANT
NEEDLE MAYO TROCAR (NEEDLE) IMPLANT
PACK SHOULDER (CUSTOM PROCEDURE TRAY) ×1 IMPLANT
PAD ARMBOARD POSITIONER FOAM (MISCELLANEOUS) ×2 IMPLANT
RESTRAINT HEAD UNIVERSAL NS (MISCELLANEOUS) ×1 IMPLANT
Reversed Insert IMPLANT
SCREW 5.0X18 (Screw) IMPLANT
SCREW BONE 6.5X40 SM (Screw) IMPLANT
SCREW LOCK PERFORM 4.26X26 (Screw) IMPLANT
SET HNDPC FAN SPRY TIP SCT (DISPOSABLE) ×1 IMPLANT
SLING ARM IMMOBILIZER LRG (SOFTGOODS) ×1 IMPLANT
SOLN 0.9% NACL POUR BTL 1000ML (IV SOLUTION) ×1 IMPLANT
SOLN STERILE WATER BTL 1000 ML (IV SOLUTION) ×1 IMPLANT
SPONGE T-LAP 18X18 ~~LOC~~+RFID (SPONGE) ×1 IMPLANT
STEM PERFORM FX SZ 11L 130 (Stem) IMPLANT
STRIP CLOSURE SKIN 1/2X4 (GAUZE/BANDAGES/DRESSINGS) ×1 IMPLANT
SUCTION TUBE FRAZIER 10FR DISP (SUCTIONS) IMPLANT
SUT ETHIBOND 2 V 37 (SUTURE) ×1 IMPLANT
SUT ETHIBOND NAB CT1 #1 30IN (SUTURE) ×1 IMPLANT
SUT MNCRL AB 3-0 PS2 18 (SUTURE) ×1 IMPLANT
SUT VIC AB 2-0 CT1 TAPERPNT 27 (SUTURE) ×1 IMPLANT
TOWEL GREEN STERILE (TOWEL DISPOSABLE) ×1 IMPLANT
TRAY FOLEY W/BAG SLVR 14FR (SET/KITS/TRAYS/PACK) IMPLANT

## 2023-11-18 NOTE — Assessment & Plan Note (Signed)
 Klebsiella bacteremia.  Patient was placed on antibiotic therapy with significant improvement No signs of spesis (ruled out)  Follow up cultures with no growth.

## 2023-11-18 NOTE — Assessment & Plan Note (Signed)
 No signs of acute exacerbation  Acute on chronic hypoxemic respiratory failure, multifactorial History of pulmonary fibrosis.   02 saturation today is 97% on 2 L/min per [place]

## 2023-11-18 NOTE — Assessment & Plan Note (Signed)
 Today with renal function stable with serum cr at 0,99 with K at 4,9 and serum bicarbonate at 21  Na 136  Continue with spironolactone .

## 2023-11-18 NOTE — Assessment & Plan Note (Signed)
Calculated BMI is 36.7

## 2023-11-18 NOTE — Anesthesia Procedure Notes (Signed)
 Procedure Name: Intubation Date/Time: 11/18/2023 3:02 PM  Performed by: Lockie Flesher, CRNAPre-anesthesia Checklist: Patient identified, Emergency Drugs available, Suction available and Patient being monitored Patient Re-evaluated:Patient Re-evaluated prior to induction Oxygen Delivery Method: Circle System Utilized Preoxygenation: Pre-oxygenation with 100% oxygen Induction Type: IV induction Ventilation: Mask ventilation without difficulty Laryngoscope Size: Mac and 4 Grade View: Grade I Tube type: Oral Tube size: 6.5 mm Number of attempts: 1 Airway Equipment and Method: Stylet and Oral airway Placement Confirmation: ETT inserted through vocal cords under direct vision, positive ETCO2 and breath sounds checked- equal and bilateral Secured at: 22 cm Tube secured with: Tape Dental Injury: Teeth and Oropharynx as per pre-operative assessment

## 2023-11-18 NOTE — Assessment & Plan Note (Signed)
 Continue blood pressure control.

## 2023-11-18 NOTE — Plan of Care (Signed)

## 2023-11-18 NOTE — Progress Notes (Addendum)
-------------------  [facility] PROCEDURAL EXPEDITER NOTE--------------------------------------- Patient Name: Teresa Jensen Patient DOB: 1945/09/15 Today's Date: 11/18/23   Chart reviewed:  Yes  Documentation gaps: N Orders in place:  Yes  Communication with surgical team if no orders: N/A Labs, test, and orders reviewed: Y - Anesthesia orders placed Requires surgical clearance:  Yes What type of clearance: Cardiac Clearance received: 11/10/23 Patient status: IP   Barriers noted: Allergy contraindication with mouthwash Intervention provided by [facility] team: Spoke with pharmacist to verify if ok to give mouthwash. It's ok to give.  Barrier resolved: Teresa Devere Penner, RN  [facility]

## 2023-11-18 NOTE — Interval H&P Note (Signed)
 All questions answered, patient wants to proceed with procedure. ? ?

## 2023-11-18 NOTE — Op Note (Signed)
 Orthopaedic Surgery Operative Note (CSN: [id number])  Gizell Danser Wurtzel  1945-11-01 Date of Surgery: 11/06/2023 - 11/18/2023   Diagnoses:  RIGHT PROXIMAL HUMERUS FRACTURE, surgical neck fracture  Procedure: Right reverse total shoulder arthroplasty 23472 Right open reduction internal fixation of tuberosities 23630   Operative Finding Successful completion of planned procedure.  Patient had significant comminution of the humeral head and displacement.  Due to her lower extremity fractures mobilization Early was important to her overall condition.  Additionally she had to be delayed for a week secondary to her positive blood cultures.  She is at high risk of dislocation periprosthetic fracture infection however I felt that the surgery was still most appropriate for her to try and improve long-term function as well as mobilization in the short-term.  Axillary nerve tug test was normal.  Post-operative plan: The patient will be NWB in sling.  The patient will be will be admitted for mobilization, medical optimization and possibly the need for short term rehabilitation in an inpatient setting due to medical issues and fragility fracture.  DVT prophylaxis Lovenox  40 mg/day until mobilizing and then consider transition in clinic to alternative medicines.  Pain control with PRN pain medication preferring oral medicines.  Follow up plan will be scheduled in approximately 7 days for incision check and XR.  Physical therapy to start for mobility purposes with a walker immediately but no shoulder range of motion needed until 4 weeks postop.  Implants: Tornier perform humeral size 11 fracture stem, +3 65% capture 10 degree polyethylene, 36 standard glenosphere with a 25+3 baseplate, 40 center screw and 3 peripheral locking screws  Post-Op Diagnosis: Same Surgeons:Primary: Cristy Bonner DASEN, MD Assistants:Caroline McBane, PA-C Location: [facility] OR ROOM 02 Anesthesia: General with Exparel  Interscalene Antibiotics:  Ancef 2g preop, Vancomycin 1000mg  locally Tourniquet time: None Estimated Blood Loss: 100 Complications: None Specimens: None Implants: Implant Name Type Inv. Item Serial No. Manufacturer Lot No. LRB No. Used Action  BASEPLATE GLENOID RSA 3X25 0D - [id number] Shoulder BASEPLATE GLENOID RSA 3X25 0D  [employer] [id number] Right 1 Implanted  HEAD CANN REV SHLD STD 36 - [id number] Shoulder HEAD CANN REV SHLD STD 36  [employer] [id number] Right 1 Implanted  SCREW 5.0X18 - [id number] Screw SCREW 5.0X18  [employer]  Right 3 Implanted  SCREW BONE 6.5X40 SM - [id number] Screw SCREW BONE 6.5X40 SM  [employer]  Right 1 Implanted  STEM PERFORM FX SZ 11L 130 - [id number] Stem STEM PERFORM FX SZ 11L 130 [id number] [employer]  Right 1 Implanted  Reversed Insert   [id number]   Right 1 Implanted  SCREW LOCK PERFORM 4.26X26 - [id number] Screw SCREW LOCK PERFORM 4.26X26  [employer]  Right 1 Implanted    Indications for Surgery:   SHANIAH BALTES is a 78 y.o. female with fall resulting in a complex right proximal humerus fracture.  Benefits and risks of operative and nonoperative management were discussed prior to surgery with patient/guardian(s) and informed consent form was completed.  Infection and need for further surgery were discussed as was prosthetic stability and cuff issues.  We additionally specifically discussed risks of axillary nerve injury, infection, periprosthetic fracture, continued pain and longevity of implants prior to beginning procedure.      Procedure:   The patient was identified in the preoperative holding area where the surgical site was marked. Block placed by anesthesia with exparel .  The patient was taken to the OR where a procedural timeout was called and the above noted anesthesia was induced.  The patient was positioned beachchair on allen table with spider arm positioner.  Preoperative antibiotics were dosed.  The patient's right shoulder was prepped and draped in the usual  sterile fashion.  A second preoperative timeout was called.       Standard deltopectoral approach was performed with a #10 blade. We dissected down to the subcutaneous tissues and the cephalic vein was taken laterally with the deltoid. Clavipectoral fascia was incised in line with the incision. Deep retractors were placed. The long of the biceps tendon was identified and there was significant tenosynovitis present.  Tenodesis was performed to the pectoralis tendon with #2 Ethibond. The remaining biceps was followed up into the rotator interval where it was released.    We used the bicipital groove as a landmark for the lesser and greater tuberosity fragments.  We were able to mobilize the lesser tuberosity fragment and placed stay sutures in the bone tendon junction to help with mobilization.  This point we were able to identify the  greater tuberosity fragment and 4 #5  FiberWire sutures were used to place into this for eventual repair of the tuberosities.  Once these were both mobilized we took care to identify the shaft fragment as well as the head fragment.  We carefully identified the head fragment were able to manually remove it.  At this point the axillary nerve was found and palpated and with a tug test noted to be intact.  Protected throughout the remainder of the case with blunt retractors.    We then released the SGHL with bovie cautery prior to placing a curved mayo at the junction of the anterior glenoid well above the axillary nerve and bluntly dissecting the subscapularis from the capsule.  We then carefully protected the axillary nerve as we gently released the inferior capsule to fully mobilize the subscapularis.  An anterior deltoid retractor was then placed as well as a small Hohmann retractor superiorly.   The glenoid was relatively preserved as we would expect in this fracture patient.  The remaining labrum was removed circumferentially taking great care not to disrupt the posterior  capsule.    The glenoid drill guide was placed and used to drill a guide pin in the center, inferior position. The glenoid face was then reamed concentrically over the guide wire. The center hole was drilled over the guidepin in a near anatomic angle of version. Next the glenoid vault was drilled back and we placed 2 locking screws.  The base plate was screwed into the glenoid vault obtaining secure fixation. We next placed superior and inferior locking screws for additional fixation.  Next the above size glenosphere was selected and impacted onto the baseplate. The center screw was tightened.   We then repositioned the arm to give access to the humeral shaft fragment.  Drill holes were placed and fiberwire sutures in the shaft for vertical fixation of the tuberosities.  We broached with the Perform humeral fracture stem implants  up to size listed above which obtained an appropriate fit.    We tried multiple options for sizes settling on the above.  The shoulder was trialed.  There was good ROM in all planes and the shoulder was stable with no inferior translation.   We then mobilized her tuberosities again and placed the anterior deep limbs of the 4 #5 fiber wires around the stem.  1 of these was tied down fixing the greater tuberosity in place after bone graft harvest from the humeral head component was  placed underneath.   The joint was reduced and thoroughly irrigated with pulsatile lavage. The remaining sutures were then placed through the subscapularis and the bone tendon junction and the tuberosities were reduced after bone graft placed beneath as autograft at the subscap.  We horizontally secured the tuberosities before placing vertical fixation with the suture that was placed into the shaft.  Tuberosities moved as a unit were happy with the overall reduction.    We irrigated copiously at this point.  Hemostasis was obtained. The deltopectoral interval was reapproximated with #1 Ethibond. The  subcutaneous tissues were closed with 3-0 Vicryl and the skin was closed with running nylon.     Aleck Stalling, PA-C, present and scrubbed throughout the case, critical for completion in a timely fashion, and for retraction, instrumentation, closure.

## 2023-11-18 NOTE — Assessment & Plan Note (Signed)
 Supportive medical therapy Pain control, PT and Ot

## 2023-11-18 NOTE — Anesthesia Procedure Notes (Addendum)
 Anesthesia Regional Block: Interscalene brachial plexus block   Pre-Anesthetic Checklist: , timeout performed,  Correct Patient, Correct Site, Correct Laterality,  Correct Procedure, Correct Position, site marked,  Risks and benefits discussed,  Surgical consent,  Pre-op evaluation,  At surgeon's request and post-op pain management  Laterality: Right  Prep: chloraprep       Needles:  Injection technique: Single-shot  Needle Type: Echogenic Needle     Needle Length: 4cm  Needle Gauge: 25     Additional Needles:   Procedures:,,,, ultrasound used (permanent image in chart),,    Narrative:  Start time: 11/18/2023 2:23 PM End time: 11/18/2023 2:26 PM Injection made incrementally with aspirations every 5 mL.  Performed by: Personally  Anesthesiologist: Boone Fess, MD  Additional Notes: Patient's chart reviewed and they were deemed appropriate candidate for procedure, at surgeon's request. Patient educated about risks, benefits, and alternatives of the block including but not limited to: temporary or permanent nerve damage, bleeding, infection, damage to surround tissues, pneumothorax, hemidiaphragmatic paralysis, unilateral Horner's syndrome, block failure, local anesthetic toxicity. Patient expressed understanding. A formal time-out was conducted consistent with institution rules.  Monitors were applied, and minimal sedation used (see nursing record). The site was prepped with skin prep and allowed to dry, and sterile gloves were used. A high frequency linear ultrasound probe with probe cover was utilized throughout. C5-7 nerve roots located and appeared anatomically normal, local anesthetic injected around them, and echogenic block needle trajectory was monitored throughout. Aspiration performed every 5ml. Lung and blood vessels were avoided. All injections were performed without resistance and free of blood and paresthesias. The patient tolerated the procedure well.  Injectate: 20ml  0.25% bupivacaine 

## 2023-11-18 NOTE — Assessment & Plan Note (Signed)
 Patient with improvement in volume status Transitioned to po furosemide .  Continue blood pressure monitoring

## 2023-11-18 NOTE — Assessment & Plan Note (Signed)
 Continue with levothyroxine

## 2023-11-18 NOTE — Progress Notes (Signed)
 Progress Note   Patient: Teresa Jensen FMW:[id number] DOB: January 13, 1945 DOA: 11/06/2023     12 DOS: the patient was seen and examined on 11/18/2023   Brief hospital course: Teresa Jensen is a 78 y.o. 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  Patient sustained right pubic ramus fracture with hematoma in pelvis, right humeral head fracture.  Trauma surgery cleared her, orthopedic consulted, admitted to [facility] service.  11/13: Hemodynamically stable, continue to have pain, repeat blood cultures negative in 1 day, likely will have her surgery on Monday per orthopedic surgery.  11/14: Hemodynamically stable, stating that morphine and Xanax  together help with pain.  Surgery now planned for Monday.  Repeat blood cultures remain negative.  11/15: Hemodynamically stable, had a BM.  Pain bearable, surgery on Monday.  Repeat blood cultures remain negative  Assessment and Plan: * Right supracondylar humerus fracture ORIF today per orthopedics Continue pain controlled    Closed fracture of multiple pubic rami (HCC) Supportive medical therapy Pain control, PT and Ot   New onset a-fib (HCC) Patient was placed on amiodarone drip for rate control,  Patient converted to sinus rhythm Plan for outpatient cardiac monitoring   Essential hypertension Continue blood pressure control    Acute on chronic diastolic (congestive) heart failure (HCC) Patient with improvement in volume status Transitioned to po furosemide .  Continue blood pressure monitoring   COPD No signs of acute  exacerbation  Acute on chronic hypoxemic respiratory failure, multifactorial History of pulmonary fibrosis.   02 saturation today is 97% on 2 L/min per [place]   CKD (chronic kidney disease), stage III (HCC) Renal function with serum cr at 0,86 with K at 4.5 and serum bicarbonate eat 23  Na 137  Continue close follow up renal function and electrolytes.   Hypothyroidism Continue with levothyroxine    GERD Continue pantoprazole    Obesity, class 2 Calculated BMI is 36.7   UTI (urinary tract infection) Klebsiella bacteremia.  Patient was placed on antibiotic therapy with significant improvement No signs of spesis (ruled out)  Follow up cultures with no growth.         Subjective: Patient with no chest pain and no dyspnea, right upper extremity pain is controlled   Physical Exam: Vitals:   11/18/23 1440 11/18/23 1445 11/18/23 1627 11/18/23 1630  BP: (!) 142/51 (!) 144/51 (!) 158/49 (!) 168/48  Pulse: 77 75 80 88  Resp: 15 14 18 20   Temp:      TempSrc:      SpO2: 97% 97% 99% 97%  Weight:      Height:       Neurology awake and alert ENT with mild pallor Cardiovascular with S1 and S2 present and regular with no gallop sor rubs Respiratory with no rales or wheezing, no rhonchi  Abdomen with no distention  No lower extremity edema   Data Reviewed:    Family Communication: I spoke with patient's husband at the bedside, we talked in detail about patient's condition, plan of care and prognosis and all questions were addressed.   Disposition: Status is: Inpatient Remains inpatient appropriate because: orthopedic procedure   Planned Discharge Destination: Skilled  nursing facility    Author: Elidia Toribio Furnace, MD 11/18/2023 5:26 PM  For on call review [email or web address].

## 2023-11-18 NOTE — Assessment & Plan Note (Signed)
 ORIF today per orthopedics Continue pain controlled

## 2023-11-18 NOTE — Anesthesia Postprocedure Evaluation (Signed)
 Anesthesia Post Note  Patient: Teresa Jensen  Procedure(s) Performed: ARTHROPLASTY, SHOULDER, TOTAL, REVERSE (Right: Shoulder)     Patient location during evaluation: PACU Anesthesia Type: General Level of consciousness: awake Pain management: pain level controlled Vital Signs Assessment: post-procedure vital signs reviewed and stable Respiratory status: spontaneous breathing, nonlabored ventilation and respiratory function stable Cardiovascular status: blood pressure returned to baseline and stable Postop Assessment: no apparent nausea or vomiting Anesthetic complications: no   No notable events documented.  Last Vitals:  Vitals:   11/18/23 1627 11/18/23 1630  BP: (!) 158/49 (!) 168/48  Pulse: 80 88  Resp: 18 20  Temp:    SpO2: 99% 97%    Last Pain:  Vitals:   11/18/23 1732  TempSrc:   PainSc: 5                  Delon Aisha Arch

## 2023-11-18 NOTE — Progress Notes (Signed)
 Patient arrived to 6N09 A&OX4 with no complaints of pain, 2L [place]. Bed in lowest position and call light within reach.

## 2023-11-18 NOTE — Progress Notes (Signed)
 Patient sent ot OR for procedure.

## 2023-11-18 NOTE — Assessment & Plan Note (Signed)
 Continue pantoprazole .

## 2023-11-18 NOTE — Progress Notes (Signed)
 Chaplain responded to spiritual care consult. Pt Teresa Jensen welcomed me into the room and shared her emotions surrounding her current health condition and upcoming surgery. Chaplain provided support through compassionate presence, reflective listening, prayer at her request, and facilitation of story sharing.  She expressed gratitude and requests further chaplain presence when her husband arrives. Chaplains will follow up.

## 2023-11-18 NOTE — Assessment & Plan Note (Signed)
 Patient was placed on amiodarone drip for rate control,  Patient converted to sinus rhythm Plan for outpatient cardiac monitoring

## 2023-11-18 NOTE — Transfer of Care (Signed)
 Immediate Anesthesia Transfer of Care Note  Patient: Chiquetta Langner Azucena  Procedure(s) Performed: ARTHROPLASTY, SHOULDER, TOTAL, REVERSE (Right: Shoulder)  Patient Location: PACU  Anesthesia Type:General and Regional  Level of Consciousness: awake, alert , and oriented  Airway & Oxygen Therapy: Patient Spontanous Breathing and Patient connected to nasal cannula oxygen  Post-op Assessment: Report given to RN and Post -op Vital signs reviewed and stable  Post vital signs: Reviewed and stable  Last Vitals:  Vitals Value Taken Time  BP 158/49 11/18/23 16:27  Temp    Pulse 82 11/18/23 16:29  Resp 25 11/18/23 16:29  SpO2 98 % 11/18/23 16:29  Vitals shown include unfiled device data.  Last Pain:  Vitals:   11/18/23 1430  TempSrc:   PainSc: 8       Patients Stated Pain Goal: 0 (11/17/23 1257)  Complications: No notable events documented.

## 2023-11-18 NOTE — Anesthesia Preprocedure Evaluation (Addendum)
 Anesthesia Evaluation   Patient awake    Reviewed: Allergy & Precautions, NPO status , Patient's Chart, lab work & pertinent test results, reviewed documented beta blocker date and time   History of Anesthesia Complications (+) PONV and history of anesthetic complications  Airway Mallampati: II  TM Distance: >3 FB Neck ROM: Full   Comment: Previous grade I view with McGrath 3, easy mask Dental no notable dental hx. 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 1-2L/min O2 at home.    + decreased breath sounds      Cardiovascular Exercise Tolerance: Poor hypertension (losartan , spironolactone ), Pt. on medications + CAD, + Peripheral Vascular Disease (Bilateral iliac artery stenosis), +CHF and + DOE  + dysrhythmias Atrial Fibrillation + Valvular Problems/Murmurs (mild AI)  Rhythm:Regular Rate:Normal - Systolic murmurs HLD  TTE 11/10/2023: IMPRESSIONS    1. Left ventricular ejection fraction, by estimation, is 60 to 65%. The  left ventricle has normal function. The left ventricle has no regional  wall motion abnormalities. Left ventricular diastolic parameters are  indeterminate.   2. Right ventricular systolic function is normal. The right ventricular  size is normal.   3. The mitral valve is normal in structure. No evidence of mitral valve  regurgitation. No evidence of mitral stenosis.   4. Technically difficult apical windows, Dopplers of the LVOT and AV were  not obtained. Not able to assess gradients. Morphologically likely mild at  most moderate aortic stenosis. . The aortic valve has an indeterminant  number of cusps. There is mild  calcification of the aortic valve. There is mild thickening of the aortic  valve. Aortic valve  regurgitation is mild.   5. The inferior vena cava is normal in size with greater than 50%  respiratory variability, suggesting right atrial pressure of 3 mmHg.     Neuro/Psych  Headaches PSYCHIATRIC DISORDERS Anxiety      Neuromuscular disease    GI/Hepatic ,GERD  Medicated,,Diverticulosis    Endo/Other  Hypothyroidism  Left adrenal adenoma, pre-diabetes  Renal/GU CRFRenal disease     Musculoskeletal  (+) Arthritis ,  Osteopenia, PMR   Abdominal   Peds  Hematology  (+) Blood dyscrasia, anemia Lab Results      Component                Value               Date                      WBC                      10.7 (H)            11/18/2023                HGB                      9.2 (L)             11/18/2023                HCT                      30.1 (L)  11/18/2023                MCV                      96.8                11/18/2023                PLT                      446 (H)             11/18/2023              Anesthesia Other Findings Past Medical History: 06/28/2011: (HFpEF) heart failure with preserved ejection fraction  (HCC)     Comment:  a.) TTE 06/28/2011: EF 55-60%, mild AR, G1DD; b.) TTE               01/16/2013: EF 55-60%, mild MR; c.) TTE 02/16/2018: EF               60-65%, G1DD; d.) MPI 11/26/2018: EF 56% 11/12/2018: Abnormal electrocardiogram 01/12/2017: Acute kidney failure 09/06/2022: Acute on chronic respiratory failure with hypoxia (HCC) No date: Adenoma of left adrenal gland No date: Anemia No date: Anxiety No date: Aortic atherosclerosis No date: Bilateral carpal tunnel syndrome No date: CAD (coronary artery disease)     Comment:  a.) HR CT chest 10/30/2021: 2 vessel CAD (LAD/RCA) No date: CAP (community acquired pneumonia) No date: COPD (chronic obstructive pulmonary disease) (HCC) 01/21/2018: COPD exacerbation (HCC) 11/01/2019: Diverticular disease of colon No date: Diverticulosis No date: Dyspnea     Comment:  DUE TO HEAT  WITH COPD 01/09/2017: Epistaxis No date: GERD (gastroesophageal reflux disease) 07/08/2015: Herpes zoster No date: Hypertension No date: Hypothyroidism 01/12/2017: Iatrogenic hypotension No date: Iliac artery stenosis, bilateral No date: Morbid obesity with BMI of 40.0-44.9, adult (HCC) No date: New onset of headaches No date: Osteopenia No date: PMR (polymyalgia rheumatica) 02/13/2018: Pneumonia No date: PONV (postoperative nausea and vomiting) No date: Pulmonary nodule, left     Comment:  a.) CT chest 10/30/2021: 1.5 cm medial LUL nodule; b.)               PET CT 11/21/2021: hypermetabolic LUL nodule (SUX max               9.3) 08/04/2020: S/P carpal tunnel release No date: Shingles No date: Stasis dermatitis 01/20/2011: Strain of chest wall No date: Tobacco abuse No date: Vitamin D  deficiency   Reproductive/Obstetrics                              Anesthesia Physical Anesthesia Plan  ASA: 4  Anesthesia Plan: General   Post-op Pain Management: Tylenol  PO (pre-op)* and Regional block*   Induction: Intravenous  PONV Risk Score and Plan: 4 or greater and Ondansetron , Dexamethasone  and Treatment may vary due to age or medical condition  Airway Management Planned: Oral ETT  Additional Equipment: None  Intra-op Plan:   Post-operative Plan: Extubation in OR  Informed Consent: I have reviewed the patients History and Physical, chart, labs and discussed the procedure including the risks, benefits and alternatives for the proposed anesthesia with the patient or authorized representative who has indicated his/her understanding and acceptance.     Dental advisory given  Plan Discussed with: CRNA and Surgeon  Anesthesia Plan Comments: (Discussed risks  of anesthesia with patient, including PONV, sore throat, lip/dental/eye damage. Rare risks discussed as well, such as cardiorespiratory and neurological sequelae, and allergic reactions. Discussed the  role of CRNA in patient's perioperative care. Patient understands.  I had an in-depth discussion regarding safety of interscalene block in this patient, specifically regarding the phrenic nerve blockade. I observed the patient during my 10-minute preop off of oxygen, and her nadir SpO2 was 92%. Discussed r/b/a of interscalene block, including elective nature. Risks discussed: - Rare: bleeding, infection, nerve damage - shortness of breath from hemidiaphragmatic paralysis - unilateral horner's syndrome - poor/non-working blocks - reactions and toxicity to local anesthetic  Given that she is inpatient, and seems to have decent respiratory reserve off of oxygen, I did suggest that an interscalene block would not be out of the question, and that we could hedge our bets by avoiding a 1-3 day block with exparel , by using regular bupivacaine  instead. Patient understands and agrees. )         Anesthesia Quick Evaluation

## 2023-11-18 NOTE — Progress Notes (Signed)
   11/18/23 1000  Spiritual Encounters  Type of Visit Follow up  Care provided to: Pt and family  Referral source Chaplain team  Reason for visit Surgical  OnCall Visit No   Chaplain made a follow up visit. The patient stated she will have a shoulder replacement surgery in the afternoon. She was hopeful to have a smooth recovery after the surgery. Patient's husband was at the bedside. Chaplain provided emotional support and offered a prayer upon her request. Chaplain also informed them that spiritual care services are available anytime if needed.   M.Kubra Susanna Kerry Resident [phone]

## 2023-11-19 DIAGNOSIS — S32599A Other specified fracture of unspecified pubis, initial encounter for closed fracture: Secondary | ICD-10-CM | POA: Diagnosis not present

## 2023-11-19 DIAGNOSIS — D649 Anemia, unspecified: Secondary | ICD-10-CM

## 2023-11-19 DIAGNOSIS — I10 Essential (primary) hypertension: Secondary | ICD-10-CM | POA: Diagnosis not present

## 2023-11-19 DIAGNOSIS — I4891 Unspecified atrial fibrillation: Secondary | ICD-10-CM | POA: Diagnosis not present

## 2023-11-19 DIAGNOSIS — I5033 Acute on chronic diastolic (congestive) heart failure: Secondary | ICD-10-CM | POA: Diagnosis not present

## 2023-11-19 LAB — BASIC METABOLIC PANEL WITH GFR
Anion gap: 16 — ABNORMAL HIGH (ref 5–15)
BUN: 23 mg/dL (ref 8–23)
CO2: 21 mmol/L — ABNORMAL LOW (ref 22–32)
Calcium: 8 mg/dL — ABNORMAL LOW (ref 8.9–10.3)
Chloride: 99 mmol/L (ref 98–111)
Creatinine, Ser: 0.99 mg/dL (ref 0.44–1.00)
GFR, Estimated: 58 mL/min — ABNORMAL LOW (ref >60)
Glucose, Bld: 96 mg/dL (ref 70–99)
Potassium: 4.9 mmol/L (ref 3.5–5.1)
Sodium: 136 mmol/L (ref 135–145)

## 2023-11-19 LAB — CBC
HCT: 27.8 % — ABNORMAL LOW (ref 36.0–46.0)
Hemoglobin: 8.5 g/dL — ABNORMAL LOW (ref 12.0–15.0)
MCH: 29.2 pg (ref 26.0–34.0)
MCHC: 30.6 g/dL (ref 30.0–36.0)
MCV: 95.5 fL (ref 80.0–100.0)
Platelets: 457 K/uL — ABNORMAL HIGH (ref 150–400)
RBC: 2.91 MIL/uL — ABNORMAL LOW (ref 3.87–5.11)
RDW: 13.9 % (ref 11.5–15.5)
WBC: 12.9 K/uL — ABNORMAL HIGH (ref 4.0–10.5)
nRBC: 0 % (ref 0.0–0.2)

## 2023-11-19 MED ORDER — OXYCODONE HCL 5 MG PO TABS
5.0000 mg | ORAL_TABLET | Freq: Four times a day (QID) | ORAL | Status: DC | PRN
  Administered 2023-11-19 – 2023-11-21 (×3): 5 mg via ORAL
  Filled 2023-11-19 (×3): qty 1

## 2023-11-19 MED ORDER — MORPHINE SULFATE (PF) 4 MG/ML IV SOLN
4.0000 mg | INTRAVENOUS | Status: DC | PRN
  Administered 2023-11-19: 4 mg via INTRAVENOUS
  Filled 2023-11-19: qty 1

## 2023-11-19 MED ORDER — TRAMADOL HCL 50 MG PO TABS
50.0000 mg | ORAL_TABLET | Freq: Four times a day (QID) | ORAL | Status: DC | PRN
  Filled 2023-11-19: qty 1

## 2023-11-19 NOTE — Plan of Care (Signed)

## 2023-11-19 NOTE — Progress Notes (Signed)
 ORTHOPAEDIC PROGRESS NOTE  s/p Procedure(s): ARTHROPLASTY, SHOULDER, TOTAL, REVERSE  SUBJECTIVE: Reports moderate pain in her right shoulder. Block is wearing off. She has sensitivity to pain medication. We discussed mobilization today. She likes to sit up on the side of the bed. Her son, Medford, was present via telephone.  OBJECTIVE: PE: General: sitting up in hospital bed, NAD RUE: Dressing CDI and sling well fitting,  full and painless ROM throughout hand with DPC of 0. + Motor in  AIN, PIN, Ulnar distributions. Axillary nerve sensation preserved and symmetric. Was able to appreciate slight deltoid motor function.  Sensation intact in medial, radial, and ulnar distributions. Well perfused digits.    Vitals:   11/19/23 0239 11/19/23 0419  BP: (!) 119/58 124/67  Pulse: 93 93  Resp: 18 20  Temp: 98.1 F (36.7 C) 98.1 F (36.7 C)  SpO2: 95% 97%    Opiates Today (MME): Today's  total administered Morphine Milligram Equivalents: 17 Opiates Yesterday (MME): Yesterday's total administered Morphine Milligram Equivalents: 81 Opiates Used in the last two days:  Inpatient Morphine Milligram Equivalents Per Day 11/11 - 11/18   Values displayed are in units of MME/Day    Order Start / End Date 11/11 11/12 11/13  11/14 11/15 11/16  Yesterday Today    oxyCODONE  (Oxy IR/ROXICODONE ) immediate release tablet 5 mg 11/17 - 11/17 -- -- -- -- -- -- 0 of Unknown --    oxyCODONE  (ROXICODONE ) 5 MG/5ML solution 5 mg 11/17 - 11/17 -- -- -- -- -- -- 0 of Unknown --            Group total: 0 of Unknown     morphine (PF) 4 MG/ML injection 4 mg 11/10 - 11/18 36 of 72 24 of 72 36 of 72 24 of 72 12 of 72 36 of 72 36 of 72 12 of 24    oxyCODONE  (Oxy IR/ROXICODONE ) immediate release tablet 5 mg 11/10 - 11/13 0 of 45 0 of 45 0 of 22.5 -- -- -- -- --    traMADol  (ULTRAM ) tablet 50 mg 11/13 - 11/18 -- -- 5 of 15 5 of 20 0 of 20 0 of 20 0 of 20 5 of 5    fentaNYL  (SUBLIMAZE ) injection 25-50 mcg 11/17 - 11/17 -- --  -- -- -- -- 30 of 45-90 --    fentaNYL  (SUBLIMAZE ) 100 MCG/2ML injection 11/17 - 11/17 -- -- -- -- -- -- 0 of Unknown --    fentaNYL  (SUBLIMAZE ) injection 50 mcg 11/17 - 11/17 -- -- -- -- -- -- 15 of 15 --    traMADol  (ULTRAM ) tablet 50 mg 11/18 - No end date -- -- -- -- -- -- -- 0 of 15    oxyCODONE  (Oxy IR/ROXICODONE ) immediate release tablet 5 mg 11/18 - No end date -- -- -- -- -- -- -- 0 of 22.5    morphine (PF) 4 MG/ML injection 4 mg 11/18 - No end date -- -- -- -- -- -- -- 0 of 48    Daily Totals  36 of 117 24 of 117 41 of 109.5 29 of 92 12 of 92 36 of 92 81 of Unknown (at least 152-197) 17 of 114.5    Calculation Errors     Order Type Date Details   oxyCODONE  (Oxy IR/ROXICODONE ) immediate release tablet 5 mg Ordered Dose -- Insufficient frequency information   oxyCODONE  (ROXICODONE ) 5 MG/5ML solution 5 mg Ordered Dose -- Insufficient frequency information   fentaNYL  (SUBLIMAZE ) 100 MCG/2ML injection Ordered  Dose -- Frequency type could not be determined           Stable post-op images.   ASSESSMENT: Teresa Jensen is a 78 y.o. female POD#1  PLAN: Weightbearing:  - RUE: okay to use right arm for weight bearing with a walker, transfers, mobilization - okay to use right arm for ADLs at waist level - okay for passive/active ROM elbow, wrist, hand exercises - no shoulder ROM x 4 weeks - no internal rotation or backwards extension x 3 months Insicional and dressing care: Reinforce dressings as needed Orthopedic device(s): Sling - okay to remove when weight bearing with a walker, when dressing, exercises, and ADLs at waist level Showering: post-op day #2 with assistance VTE prophylaxis: Currently on Heparin. Okay to discharge on Aspirin 81 BID.  Pain control: PRN pain medication, minimize narcotics as able. Caution with narcotics as she is sensitive to medication.  Follow - up plan: 2 weeks in office Dispo: TBD. PT/OT evals today. Likely SNF.  Contact information:  Weekdays  8-5 Dr. Bonner Hair, Aleck Stalling PA-C, After hours and holidays please check [email or web address] for group call information for [facility]  Aleck Stalling, PA-C 11/19/23

## 2023-11-19 NOTE — Assessment & Plan Note (Signed)
 Reactive leukocytosis with wbc at 12.9  Follow up hgb stable at 8,5

## 2023-11-19 NOTE — Evaluation (Signed)
 Physical Therapy Re Evaluation Patient Details Name: Teresa Jensen MRN: [id number] DOB: Jun 04, 1945 Today's Date: 11/19/2023  History of Present Illness  Pt is a 78 y.o. female who presented 11/06/23 after fall. Xrays of the right shoulder demonstrate a displaced proximal humerus fracture. CT pelvis demonstrates right sided pubic rami fractures. Non-op management of RLE, 11/17 R reverse total shoulder arthroplasty and ORIF of tuberosities. 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  Clinical Impression  Pt admitted with above diagnosis. Pt seen by PT/ OT after R TSA. Pt very anxious with all mobility but responds well with encouragement. Needed max A +2 to come to EOB. Pt educated on new order for use of RUE and verbalized understanding. Mod A +2 needed to stand and pivot to recliner with RW. Pt continues to be unable to pick up feet to progress ambulation but rather slides them heel-toe. Denies ability to pick up due to R groin/ back pain. Patient will benefit from continued inpatient follow up therapy, <3 hours/day.  Pt currently with functional limitations due to the deficits listed below (see PT Problem List). Pt will benefit from acute skilled PT to increase their independence and safety with mobility to allow discharge.           If plan is discharge home, recommend the following: Two people to help with walking and/or transfers;A lot of help with bathing/dressing/bathroom;Assistance with cooking/housework;Assist for transportation;Help with stairs or ramp for entrance   Can travel by private vehicle   No    Equipment Recommendations Hospital bed;Wheelchair (measurements PT);Wheelchair cushion (measurements PT)   Recommendations for Other Services       Functional Status Assessment Patient has had a recent decline in their functional status and demonstrates the ability to make significant improvements in function in a reasonable and predictable amount of time.     Precautions / Restrictions Precautions Precautions: Fall;Shoulder Type of Shoulder Precautions: NO shoulder ROM x 4 weeks, okay to use R UE for waist level ADLs, A/PROM of elbow/wrist/hand Shoulder Interventions: Shoulder sling/immobilizer;Off for dressing/bathing/exercises (for transfers) Precaution Booklet Issued: No Recall of Precautions/Restrictions: Impaired Required Braces or Orthoses: Sling Restrictions Weight Bearing Restrictions Per Provider Order: Yes RUE Weight Bearing Per Provider Order: Non weight bearing RLE Weight Bearing Per Provider Order: Weight bearing as tolerated Other Position/Activity Restrictions: R UE WBAT for transfers using RW      Mobility  Bed Mobility Overal bed mobility: Needs Assistance Bed Mobility: Rolling, Sidelying to Sit Rolling: Max assist Sidelying to sit: Max assist, +2 for physical assistance       General bed mobility comments: rolling towards L side, pt using LUE but needing support to roll and elevated trunk, control LEs to EOB    Transfers Overall transfer level: Needs assistance Equipment used: Rolling walker (2 wheels) Transfers: Sit to/from Stand, Bed to chair/wheelchair/BSC Sit to Stand: Mod assist, +2 physical assistance   Step pivot transfers: Mod assist, +2 physical assistance       General transfer comment: using RW, cueing for hand placement and only holding RW once standing with R UE.  Small pivotal steps (heel toe) to recliner on R side.  Increased time. Needed assist to manage RW as well as continuous encouragement to keep going    Ambulation/Gait  General Gait Details: pt unable to pick up feet in standing, is sliding them heel-toe due to  pain  Stairs            Wheelchair Mobility     Tilt Bed    Modified Rankin (Stroke Patients Only)       Balance Overall balance assessment: Needs assistance Sitting-balance support: No upper extremity supported, Feet supported Sitting balance-Leahy Scale: Fair Sitting balance - Comments: needing contact guard staticially for safety   Standing balance support: Single extremity supported, Bilateral upper extremity supported, During functional activity, Reliant on assistive device for balance Standing balance-Leahy Scale: Poor Standing balance comment: relies on UE and external support, slight L lateral lean                             Pertinent Vitals/Pain Pain Assessment Pain Assessment: Faces Faces Pain Scale: Hurts even more Pain Location: R UE, R hip Pain Descriptors / Indicators: Grimacing, Guarding, Discomfort, Aching, Moaning Pain Intervention(s): Limited activity within patient's tolerance, Monitored during session    Home Living Family/patient expects to be discharged to:: Private residence Living Arrangements: Spouse/significant other Available Help at Discharge: Family;Available 24 hours/day Type of Home: House Home Access: Level entry       Home Layout: Two level;Able to live on main level with bedroom/bathroom;Full bath on main level Home Equipment: Hand held shower head;Shower seat - built in Additional Comments: Pt has a portable oxygen tank and carrier at home, 1L at rest, 2L with activity    Prior Function Prior Level of Function : Independent/Modified Independent             Mobility Comments: Ambulates without AD. 1 fall leading to admit, when blowing leaves ADLs Comments: Indep with ADLs. Manages her own meds. Doesn't drive.     Extremity/Trunk Assessment   Upper Extremity Assessment Upper Extremity Assessment: Defer to OT evaluation RUE Deficits / Details: S/P reverse TSA and ORIF of tuberosities.  numbness in UE, WFL  hand, wrist ROM; elbow/forearm limited by pain/edema. NO shoulder movement allowed. RUE: Unable to fully assess due to pain RUE Sensation: decreased light touch RUE Coordination: decreased gross motor    Lower Extremity Assessment Lower Extremity Assessment: Generalized weakness RLE Deficits / Details: Decreased hip and knee AROM. Able to perform partial R heel slide. Hip flex 2/5, knee ext 3-/5 RLE: Unable to fully assess due to pain RLE Sensation: WNL RLE Coordination: decreased gross motor    Cervical / Trunk Assessment Cervical / Trunk Assessment: Other exceptions Cervical / Trunk Exceptions: increased body habitus  Communication   Communication Communication: No apparent difficulties    Cognition Arousal: Alert Behavior During Therapy: WFL for tasks assessed/performed, Anxious   PT - Cognitive impairments: No apparent impairments                       PT - Cognition Comments: Pt A,Ox4. Very anxious with all mobility and gets a bit agitated Following commands: Impaired Following commands impaired: Follows one step commands with increased time, Follows multi-step commands inconsistently     Cueing Cueing Techniques: Verbal cues, Visual cues     General Comments General comments (skin integrity, edema, etc.): SPO2 in 90's on 2L O2, HR 107 bpm with activity    Exercises General Exercises - Lower Extremity Ankle Circles/Pumps: AROM, Both, 10 reps, Seated Long Arc Quad: AROM, Both, 10 reps, Seated   Assessment/Plan    PT  Assessment Patient needs continued PT services  PT Problem List Decreased strength;Decreased range of motion;Decreased activity tolerance;Decreased balance;Decreased mobility;Decreased cognition;Decreased knowledge of use of DME;Decreased safety awareness;Pain       PT Treatment Interventions DME instruction;Gait training;Functional mobility training;Therapeutic activities;Therapeutic exercise;Patient/family education;Wheelchair mobility  training    PT Goals (Current goals can be found in the Care Plan section)  Acute Rehab PT Goals Patient Stated Goal: Have less pain and move better PT Goal Formulation: With patient/family Time For Goal Achievement: 12/03/23 Potential to Achieve Goals: Fair    Frequency Min 2X/week     Co-evaluation PT/OT/SLP Co-Evaluation/Treatment: Yes Reason for Co-Treatment: For patient/therapist safety;To address functional/ADL transfers PT goals addressed during session: Mobility/safety with mobility;Balance;Proper use of DME;Strengthening/ROM OT goals addressed during session: ADL's and self-care       AM-PAC PT 6 Clicks Mobility  Outcome Measure Help needed turning from your back to your side while in a flat bed without using bedrails?: A Lot Help needed moving from lying on your back to sitting on the side of a flat bed without using bedrails?: A Lot Help needed moving to and from a bed to a chair (including a wheelchair)?: Total Help needed standing up from a chair using your arms (e.g., wheelchair or bedside chair)?: Total Help needed to walk in hospital room?: Total Help needed climbing 3-5 steps with a railing? : Total 6 Click Score: 8    End of Session Equipment Utilized During Treatment: Gait belt;Oxygen Activity Tolerance: Patient limited by pain;Patient limited by fatigue Patient left: in chair;with call bell/phone within reach;with chair alarm set;with family/visitor present Nurse Communication: Mobility status;Need for lift equipment Laurent for transfers) PT Visit Diagnosis: Pain;Difficulty in walking, not elsewhere classified (R26.2);Other abnormalities of gait and mobility (R26.89);Unsteadiness on feet (R26.81);Repeated falls (R29.6);Muscle weakness (generalized) (M62.81);History of falling (Z91.81) Pain - Right/Left: Right Pain - part of body: Shoulder;Hip    Time: 9054-8979 PT Time Calculation (min) (ACUTE ONLY): 35 min   Charges:   PT Evaluation $PT  Re-evaluation: 1 Re-eval   PT General Charges $$ ACUTE PT VISIT: 1 Visit         Richerd Lipoma, PT  Acute Rehab Services Secure chat preferred Office [phone]   Richerd CROME Tomi Paddock 11/19/2023, 1:46 PM

## 2023-11-19 NOTE — Plan of Care (Signed)
 Pt is alert and oriented x 4. Turn with 2 assist. Received morphine x 1 and tramadol  x 1. Vitals stable. Neuro vasc. WNL. Sling to right arm. Refused scd but on heparin every 8 hours.  Problem: Education: Goal: Knowledge of General Education information will improve Description: Including pain rating scale, medication(s)/side effects and non-pharmacologic comfort measures Outcome: Progressing   Problem: Health Behavior/Discharge Planning: Goal: Ability to manage health-related needs will improve Outcome: Progressing   Problem: Clinical Measurements: Goal: Ability to maintain clinical measurements within normal limits will improve Outcome: Progressing Goal: Will remain free from infection Outcome: Progressing Goal: Diagnostic test results will improve Outcome: Progressing Goal: Respiratory complications will improve Outcome: Progressing Goal: Cardiovascular complication will be avoided Outcome: Progressing   Problem: Activity: Goal: Risk for activity intolerance will decrease Outcome: Progressing   Problem: Nutrition: Goal: Adequate nutrition will be maintained Outcome: Progressing   Problem: Coping: Goal: Level of anxiety will decrease Outcome: Progressing   Problem: Elimination: Goal: Will not experience complications related to bowel motility Outcome: Progressing Goal: Will not experience complications related to urinary retention Outcome: Progressing   Problem: Pain Managment: Goal: General experience of comfort will improve and/or be controlled Outcome: Progressing   Problem: Safety: Goal: Ability to remain free from injury will improve Outcome: Progressing   Problem: Skin Integrity: Goal: Risk for impaired skin integrity will decrease Outcome: Progressing

## 2023-11-19 NOTE — Progress Notes (Signed)
 Occupational Therapy Re-eval Patient Details Name: Teresa Jensen MRN: [id number] DOB: Sep 03, 1945 Today's Date: 11/19/2023   History of present illness Pt is a 77 y.o. female who presented 11/06/23 after fall. Xrays of the right shoulder demonstrate a displaced proximal humerus fracture. CT pelvis demonstrates right sided pubic rami fractures. Non-op management of RLE, 11/17 R reverse total shoulder arthroplasty and ORIF of tuberosities. 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   OT comments  Pt seen for re-eval post R UE surgery. Pt educated on restrictions to L UE, sling mgmt and ability to weightbear on RW during transfers (per MD orders).  Patient requires max assist +2 for bed mobility, mod assist +2 for transfers using RW, and min to max assist for Adls.  She requires reinforcement of precautions, cueing for safety and encouragement due to fear of falling.  Increased time for all tasks, fatigued very quickly.  Added R UE HEP goal for elbow, wrist, forearm and hand ROM, otherwise goals remain appropriate.  Will follow acutely, continue to recommend <3hrs/day inpatient setting at dc.       If plan is discharge home, recommend the following:  Two people to help with walking and/or transfers;A lot of help with bathing/dressing/bathroom;Assistance with cooking/housework;Assist for transportation;Help with stairs or ramp for entrance   Equipment Recommendations  BSC/3in1;Other (comment) (defer)    Recommendations for Other Services      Precautions / Restrictions Precautions Precautions: Fall;Shoulder Type of Shoulder Precautions: NO shoulder ROM x 4 weeks, okay to use R UE for waist level ADLs, A/PROM of  elbow/wrist/hand Shoulder Interventions: Shoulder sling/immobilizer;Off for dressing/bathing/exercises (for transfers) Precaution Booklet Issued: No Recall of Precautions/Restrictions: Impaired Required Braces or Orthoses: Sling Restrictions Weight Bearing Restrictions Per Provider Order: Yes RUE Weight Bearing Per Provider Order: Non weight bearing RLE Weight Bearing Per Provider Order: Weight bearing as tolerated Other Position/Activity Restrictions: R UE WBAT for transfers using RW       Mobility Bed Mobility Overal bed mobility: Needs Assistance Bed Mobility: Rolling, Sidelying to Sit Rolling: Max assist Sidelying to sit: Max assist, +2 for physical assistance       General bed mobility comments: rolling towards L side, pt using LUE but needing support to roll and elevated trunk, control LEs to EOB    Transfers Overall transfer level: Needs assistance Equipment used: Rolling walker (2 wheels) Transfers: Sit to/from Stand, Bed to chair/wheelchair/BSC Sit to Stand: Mod assist, +2 physical assistance Stand pivot transfers: Mod assist, +2 physical assistance         General transfer comment: using RW, cueing for hand placement and only holding RW once standing with R UE.  Small pivotal steps (heel toe) to recliner on R side.  Increased time.     Balance Overall balance assessment: Needs assistance Sitting-balance support: No upper extremity supported, Feet supported Sitting balance-Leahy Scale: Fair Sitting balance - Comments: needing contact guard staticially for safety   Standing balance support: Single extremity supported, Bilateral upper extremity supported, During functional activity, Reliant on assistive device for balance Standing balance-Leahy Scale: Poor Standing balance comment: relies on UE and external support, slight L lateral lean                           ADL either performed  or assessed with clinical judgement   ADL Overall ADL's : Needs  assistance/impaired     Grooming: Minimal assistance;Sitting           Upper Body Dressing : Moderate assistance;Sitting   Lower Body Dressing: Total assistance;+2 for physical assistance;Sit to/from stand   Toilet Transfer: Moderate assistance;+2 for physical assistance;Rolling walker (2 wheels) Toilet Transfer Details (indicate cue type and reason): RW to  L side         Functional mobility during ADLs: Moderate assistance;Maximal assistance;+2 for physical assistance      Extremity/Trunk Assessment Upper Extremity Assessment Upper Extremity Assessment: Right hand dominant;RUE deficits/detail RUE Deficits / Details: S/P reverse TSA and ORIF of tuberosities.  numbness in UE, WFL hand, wrist ROM; elbow/forearm limited by pain/edema. NO shoulder movement allowed. RUE: Unable to fully assess due to pain RUE Sensation: decreased light touch RUE Coordination: decreased gross motor            Vision       Perception     Praxis     Communication Communication Communication: No apparent difficulties   Cognition Arousal: Alert Behavior During Therapy: WFL for tasks assessed/performed, Anxious Cognition: Cognition impaired     Awareness: Online awareness impaired Memory impairment (select all impairments): Short-term memory Attention impairment (select first level of impairment): Alternating attention Executive functioning impairment (select all impairments): Reasoning, Problem solving OT - Cognition Comments: requires repetition for safety, highly fearful of falling                 Following commands: Impaired Following commands impaired: Follows one step commands with increased time, Follows multi-step commands inconsistently      Cueing   Cueing Techniques: Verbal cues, Visual cues  Exercises Exercises: Other exercises Other Exercises Other Exercises: R UE: AAROM elbow flexion/extension, supination/pronation x 8 reps. Pt completing gross hand  flexion/extension and wrist movement actively.    Shoulder Instructions       General Comments on 2L during session, VSS. Donned sling after transfer.    Pertinent Vitals/ Pain       Pain Assessment Pain Assessment: Faces Faces Pain Scale: Hurts even more Pain Location: R UE, R hip Pain Descriptors / Indicators: Grimacing, Guarding, Discomfort, Aching, Moaning Pain Intervention(s): Limited activity within patient's tolerance, Monitored during session, Repositioned  Home Living                                          Prior Functioning/Environment              Frequency  Min 2X/week        Progress Toward Goals  OT Goals(current goals can now be found in the care plan section)  Progress towards OT goals: Progressing toward goals  Acute Rehab OT Goals Patient Stated Goal: less pain OT Goal Formulation: With patient Time For Goal Achievement: 12/03/23 Potential to Achieve Goals: Good ADL Goals Pt Will Perform Grooming: with set-up;sitting Pt Will Perform Lower Body Dressing: with mod assist;sit to/from stand;sitting/lateral leans;with adaptive equipment Pt Will Transfer to Toilet: with mod assist;stand pivot transfer;bedside commode Pt/caregiver will Perform Home Exercise Program: Increased ROM;Right Upper extremity;With written HEP provided Additional ADL Goal #1: Pt will complete bed mobility with min assist.  Plan      Co-evaluation    PT/OT/SLP Co-Evaluation/Treatment: Yes Reason for Co-Treatment: For patient/therapist safety;To address functional/ADL transfers   OT goals addressed  during session: ADL's and self-care      AM-PAC OT 6 Clicks Daily Activity     Outcome Measure   Help from another person eating meals?: A Little Help from another person taking care of personal grooming?: A Little Help from another person toileting, which includes using toliet, bedpan, or urinal?: Total Help from another person bathing (including  washing, rinsing, drying)?: A Lot Help from another person to put on and taking off regular upper body clothing?: A Lot Help from another person to put on and taking off regular lower body clothing?: Total 6 Click Score: 12    End of Session Equipment Utilized During Treatment: Gait belt;Rolling walker (2 wheels);Oxygen;Other (comment) (sling)  OT Visit Diagnosis: Other abnormalities of gait and mobility (R26.89);Muscle weakness (generalized) (M62.81);Pain;History of falling (Z91.81) Pain - Right/Left: Right Pain - part of body: Shoulder;Arm;Hip   Activity Tolerance Patient tolerated treatment well   Patient Left in chair;with call bell/phone within reach;with chair alarm set;with nursing/sitter in room   Nurse Communication Mobility status;Precautions;Need for lift equipment        Time: 9051-8980 OT Time Calculation (min): 31 min  Charges: OT General Charges $OT Visit: 1 Visit OT Evaluation $OT Re-eval: 1 Re-eval  Etta NOVAK, OT Acute Rehabilitation Services Office [phone] Secure Chat Preferred    Etta GORMAN Hope 11/19/2023, 1:02 PM

## 2023-11-19 NOTE — Care Management Important Message (Signed)
 Important Message  Patient Details  Name: Teresa Jensen MRN: [id number] Date of Birth: 1945-12-28   Important Message Given:  Yes - Medicare IM     [name] 11/19/2023, 1:21 PM

## 2023-11-19 NOTE — Progress Notes (Signed)
 Progress Note   Patient: Teresa Jensen FMW:[id number] DOB: 1945-09-20 DOA: 11/06/2023     13 DOS: the patient was seen and examined on 11/19/2023   Brief hospital course: Teresa Jensen is a 78 y.o. 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  Patient sustained right pubic ramus fracture with hematoma in pelvis, right humeral head fracture.  Trauma surgery cleared her, orthopedic consulted, admitted to [facility] service.  11/13: Hemodynamically stable, continue to have pain, repeat blood cultures negative in 1 day, likely will have her surgery on Monday per orthopedic surgery.  11/14: Hemodynamically stable, stating that morphine and Xanax  together help with pain.  Surgery now planned for Monday.  Repeat blood cultures remain negative.  11/15: Hemodynamically stable, had a BM.  Pain bearable, surgery on Monday.  Repeat blood cultures remain negative 11/18 right reverse total shoulder arthroplasty, right open reduction internal fixation of tuberosities.   Assessment and Plan: * Right supracondylar humerus fracture 11/27 ORIF to the right shoulder per orthopedics Continue pain control with oxycodone , tramadol  and muscle relaxant with methocarbamol.  Continue PT and OT, follow up orthopedic surgery recommendations    Closed fracture of multiple pubic rami (HCC) Supportive medical therapy Pain control, PT and Ot   New onset a-fib (HCC) Patient was placed on amiodarone drip for rate control,  Patient converted to sinus rhythm Now off amiodarone.  Not on anticoagulation due to  history of epistaxis requiring cautery.  Plan for outpatient cardiac monitoring   Essential hypertension Continue blood pressure control spironolactone    Acute on chronic diastolic (congestive) heart failure (HCC) Echocardiogram with preserved LV systolic function with EF 60 to 65%, RV systolic function preserved, mild to moderate aortic stenosis, LA and RA with normal size.   Improved volume status Plan to continue with furosemide  only as needed.  Continue blood pressure monitoring.   COPD No signs of acute exacerbation  Acute on chronic hypoxemic respiratory failure, multifactorial History of pulmonary fibrosis.   02 saturation today is 97% on 2 L/min per [place]   Chronic kidney disease, stage 3a (HCC) Today with renal function stable with serum cr at 0,99 with K at 4,9 and serum bicarbonate at 21  Na 136  Continue with spironolactone .   Hypothyroidism Continue with levothyroxine    GERD Continue pantoprazole    UTI (urinary tract infection) Klebsiella bacteremia.  Patient was placed on antibiotic therapy with significant improvement No signs of spesis (ruled out)  Follow up cultures with no growth.   Chronic anemia Reactive leukocytosis with wbc at 12.9  Follow up hgb stable at 8,5   Obesity, class 2 Calculated BMI is 36.7         Subjective: Patient with no chest pain or dyspnea, right shoulder pain is controlled with analgesics, today very weak and deconditioned, she did work with PT and OT   Physical Exam: Vitals:   11/19/23 0239 11/19/23 0419 11/19/23 1052 11/19/23 1400  BP: (!) 119/58 124/67 (!) 103/51 (!) 125/47  Pulse: 93 93 98 81  Resp: 18 20 17 16   Temp: 98.1 F (36.7 C) 98.1 F (36.7 C) 98.8 F (37.1 C) 99 F (37.2 C)  TempSrc: Oral Oral Oral Oral  SpO2: 95% 97% 96% 98%  Weight:      Height:       Neurology awake and alert ENT with mild pallor Cardiovascular with S1 and S2 present and regular with no gallops, rubs, positive systolic murmur  at the base Respiratory with no rales or wheezing, no rhonchi  Abdomen with no distention  No lower extremity edema  Right upper extremity on sling  Data Reviewed:    Family Communication: no family at the bedside   Disposition: Status is: Inpatient Remains inpatient appropriate because: pending transfer to SNF   Planned Discharge Destination: Skilled nursing facility      Author: Elidia Toribio Furnace, MD 11/19/2023 5:23 PM  For on call review [email or web address].

## 2023-11-20 DIAGNOSIS — D649 Anemia, unspecified: Secondary | ICD-10-CM | POA: Diagnosis not present

## 2023-11-20 DIAGNOSIS — S32591A Other specified fracture of right pubis, initial encounter for closed fracture: Secondary | ICD-10-CM | POA: Diagnosis not present

## 2023-11-20 DIAGNOSIS — S42411A Displaced simple supracondylar fracture without intercondylar fracture of right humerus, initial encounter for closed fracture: Secondary | ICD-10-CM | POA: Diagnosis not present

## 2023-11-20 LAB — CBC
HCT: 25 % — ABNORMAL LOW (ref 36.0–46.0)
Hemoglobin: 7.7 g/dL — ABNORMAL LOW (ref 12.0–15.0)
MCH: 29.8 pg (ref 26.0–34.0)
MCHC: 30.8 g/dL (ref 30.0–36.0)
MCV: 96.9 fL (ref 80.0–100.0)
Platelets: 422 K/uL — ABNORMAL HIGH (ref 150–400)
RBC: 2.58 MIL/uL — ABNORMAL LOW (ref 3.87–5.11)
RDW: 13.9 % (ref 11.5–15.5)
WBC: 12.6 K/uL — ABNORMAL HIGH (ref 4.0–10.5)
nRBC: 0 % (ref 0.0–0.2)

## 2023-11-20 LAB — BASIC METABOLIC PANEL WITH GFR
Anion gap: 14 (ref 5–15)
BUN: 25 mg/dL — ABNORMAL HIGH (ref 8–23)
CO2: 23 mmol/L (ref 22–32)
Calcium: 7.9 mg/dL — ABNORMAL LOW (ref 8.9–10.3)
Chloride: 99 mmol/L (ref 98–111)
Creatinine, Ser: 1.07 mg/dL — ABNORMAL HIGH (ref 0.44–1.00)
GFR, Estimated: 53 mL/min — ABNORMAL LOW (ref >60)
Glucose, Bld: 94 mg/dL (ref 70–99)
Potassium: 4.3 mmol/L (ref 3.5–5.1)
Sodium: 136 mmol/L (ref 135–145)

## 2023-11-20 LAB — HEMOGLOBIN AND HEMATOCRIT, BLOOD
HCT: 25.1 % — ABNORMAL LOW (ref 36.0–46.0)
Hemoglobin: 7.8 g/dL — ABNORMAL LOW (ref 12.0–15.0)

## 2023-11-20 MED ORDER — ASPIRIN 81 MG PO TBEC: 81.0000 mg | DELAYED_RELEASE_TABLET | Freq: Two times a day (BID) | ORAL | Status: AC

## 2023-11-20 MED ORDER — ALPRAZOLAM 0.25 MG PO TABS: 0.2500 mg | ORAL_TABLET | Freq: Two times a day (BID) | ORAL | 0 refills | Status: AC | PRN

## 2023-11-20 MED ORDER — SENNOSIDES-DOCUSATE SODIUM 8.6-50 MG PO TABS
2.0000 | ORAL_TABLET | Freq: Two times a day (BID) | ORAL | Status: DC
  Administered 2023-11-20: 2 via ORAL
  Filled 2023-11-20 (×5): qty 2

## 2023-11-20 MED ORDER — ACETAMINOPHEN 500 MG PO TABS: 500.0000 mg | ORAL_TABLET | ORAL | Status: AC | PRN

## 2023-11-20 MED ORDER — POLYETHYLENE GLYCOL 3350 17 G PO PACK
17.0000 g | PACK | Freq: Two times a day (BID) | ORAL | Status: DC
  Administered 2023-11-20: 17 g via ORAL
  Filled 2023-11-20 (×3): qty 1

## 2023-11-20 MED ORDER — OXYCODONE HCL 5 MG PO TABS: 5.0000 mg | ORAL_TABLET | Freq: Four times a day (QID) | ORAL | 0 refills | Status: AC | PRN

## 2023-11-20 MED ORDER — METHOCARBAMOL 500 MG PO TABS: 500.0000 mg | ORAL_TABLET | Freq: Three times a day (TID) | ORAL | Status: AC

## 2023-11-20 NOTE — Discharge Instructions (Signed)
 Bonner Hair MD, MPH Aleck Stalling, PA-C [facility] [address] [phone] (tel)   [phone] (fax)   POST-OPERATIVE INSTRUCTIONS - TOTAL SHOULDER REPLACEMENT    WOUND CARE You may leave the operative dressing in place until your follow-up appointment. KEEP THE INCISIONS CLEAN AND DRY. There may be a small amount of fluid/bleeding leaking at the surgical site. This is normal after surgery.  If it fills with liquid or blood please call us  immediately to change it for you. Use the provided ice machine or Ice packs as often as possible for the first 3-4 days, then as needed for pain relief.   Keep a layer of cloth or a shirt between your skin and the cooling unit to prevent frost bite as it can get very cold.  SHOWERING: - You may shower on Post-Op Day #2.  - The dressing is water resistant but do not scrub it as it may start to peel up.   - You may remove the sling for showering - Gently pat the area dry.  - Do not soak the shoulder in water.  - Do not go swimming in the pool or ocean until your incision has completely healed (about 4-6 weeks after surgery) - KEEP THE INCISIONS CLEAN AND DRY.  EXERCISES  You may remove the sling for showering, but keep the arm across the chest or in a secondary sling.    Accidental/Purposeful External Rotation and shoulder flexion (reaching behind you) is to be avoided at all costs for the first month. It is ok to come out of your sling if your are sitting and have assistance for eating.   Do not lift anything heavier than 1 pound until we discuss it further in clinic.  It is normal for your fingers/hand to become more swollen after surgery and discolored from bruising.   This will resolve over the first few weeks usually after surgery. Please continue to ambulate and do not stay sitting or lying for too long.  Perform foot and wrist pumps to assist in circulation.  okay to use right arm for weight bearing  with a walker, transfers, mobilization okay to use right arm for ADLs at waist level okay for passive/active ROM elbow, wrist, hand exercises no shoulder ROM x 4 weeks no internal rotation or backwards extension x 3 months   FOLLOW-UP If you develop a Fever (>101.5), Redness or Drainage from the surgical incision site, please call our office to arrange for an evaluation. Please call the office to schedule a follow-up appointment for a wound check, 7-10 days post-operatively.  IF YOU HAVE ANY QUESTIONS, PLEASE FEEL FREE TO CALL OUR OFFICE.  HELPFUL INFORMATION  Your arm will be in a sling following surgery. You will be in this sling for the next 4 weeks.   You may be more comfortable sleeping in a semi-seated position the first few nights following surgery.  Keep a pillow propped under the elbow and forearm for comfort.  If you have a recliner type of chair it might be beneficial.  If not that is fine too, but it would be helpful to sleep propped up with pillows behind your operated shoulder as well under your elbow and forearm.  This will reduce pulling on the suture lines.  When dressing, put your operative arm in the sleeve first.  When getting undressed, take your operative arm out last.  Loose fitting, button-down shirts are recommended.  In most states it is against the law  to drive while your arm is in a sling. And certainly against the law to drive while taking narcotics.  You may return to work/school in the next couple of days when you feel up to it. Desk work and typing in the sling is fine.  We suggest you use the pain medication the first night prior to going to bed, in order to ease any pain when the anesthesia wears off. You should avoid taking pain medications on an empty stomach as it will make you nauseous.  You should wean off your narcotic medicines as soon as you are able.     Most patients will be off narcotics before their first postop appointment.   Do not drink  alcoholic beverages or take illicit drugs when taking pain medications.  Pain medication may make you constipated.  Below are a few solutions to try in this order: Decrease the amount of pain medication if you aren't having pain. Drink lots of decaffeinated fluids. Drink prune juice and/or each dried prunes  If the first 3 don't work start with additional solutions Take Colace - an over-the-counter stool softener Take Senokot - an over-the-counter laxative Take Miralax  - a stronger over-the-counter laxative   Dental Antibiotics:  We require dental prophylaxis for 2 years after a shoulder replacement  Contact your surgeon for an antibiotic prescription, prior to your dental procedure.   For more information including helpful videos and documents visit our website:   [email or web address]

## 2023-11-20 NOTE — Progress Notes (Signed)
 Occupational Therapy Treatment Patient Details Name: Teresa Jensen MRN: [id number] DOB: August 12, 1945 Today's Date: 11/20/2023   History of present illness Pt is a 78 y.o. female who presented 11/06/23 after fall. Xrays of the right shoulder demonstrate a displaced proximal humerus fracture. CT pelvis demonstrates right sided pubic rami fractures. Non-op management of RLE, 11/17 R reverse total shoulder arthroplasty and ORIF of tuberosities. 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   OT comments  Patient supine in bed, agreeable to OT session.  Pt provided elbow/wrist/forearm/hand HEP for R UE, sling handout. Reviewed exercises, but only completing hand, wrist, forearm due to pt voicing need to have BM.  Rolling in bed towards L side with mod assist, positioning on bed pan with roll and hip bridge multiple times and repositioning in bed with mod assist.   Limited session, as needing to go back on bed pan after cleaned up.  RN notified pt on bedpan upon therapist exit.  Will follow acutely, continue to recommend <3hrs/day inpatient setting at dc.       If plan is discharge home, recommend the following:  Two people to help with walking and/or transfers;A lot of help with bathing/dressing/bathroom;Assistance with cooking/housework;Assist for transportation;Help with stairs or ramp for entrance   Equipment Recommendations  BSC/3in1;Other (comment) (defer)    Recommendations for Other Services      Precautions / Restrictions Precautions Precautions: Fall;Shoulder Type of Shoulder Precautions: NO shoulder ROM x 4 weeks, okay to use R UE for waist level ADLs, A/PROM of elbow/wrist/hand Shoulder Interventions: Shoulder  sling/immobilizer;Off for dressing/bathing/exercises (off for transfers) Precaution Booklet Issued: Yes (comment) Recall of Precautions/Restrictions: Impaired Required Braces or Orthoses: Sling Restrictions Other Position/Activity Restrictions: R UE WBAT for transfers using RW       Mobility Bed Mobility Overal bed mobility: Needs Assistance Bed Mobility: Rolling Rolling: Mod assist         General bed mobility comments: mod assist to roll towards L side, mod assist to pull up to [facility]    Transfers                   General transfer comment: deferred     Balance                                           ADL either performed or assessed with clinical judgement   ADL Overall ADL's : Needs assistance/impaired                     Lower Body Dressing: Total assistance;+2 for physical assistance     Toilet Transfer Details (indicate cue type and reason): deferred Toileting- Clothing Manipulation and Hygiene: Total assistance;+2 for physical assistance;Bed level Toileting - Clothing Manipulation Details (indicate cue type and reason): +BM, urgent on bedpan     Functional mobility during ADLs: Moderate assistance      Extremity/Trunk Assessment Upper Extremity Assessment Upper Extremity Assessment: RUE deficits/detail RUE Deficits / Details: S/P reverse TSA and ORIF of tuberosities.  numbness in UE, WFL hand, wrist, forearm ROM; elbow ROM limited but not assesed this date (pt needing to use bedpan). NO shoulder movement allowed. RUE Sensation: decreased light touch RUE  Coordination: decreased gross motor            Vision       Perception     Praxis     Communication Communication Communication: No apparent difficulties   Cognition Arousal: Alert Behavior During Therapy: WFL for tasks assessed/performed, Anxious Cognition: Cognition impaired     Awareness: Online awareness impaired Memory impairment (select all  impairments): Short-term memory Attention impairment (select first level of impairment): Selective attention Executive functioning impairment (select all impairments): Reasoning, Problem solving OT - Cognition Comments: patient requires further education on R UE restrictions. pt internally distracted from frustrations of being in the hospital and not fully understanding the plan (thinking rehab would happen acutely)                 Following commands: Impaired Following commands impaired: Follows one step commands with increased time, Follows multi-step commands inconsistently      Cueing   Cueing Techniques: Verbal cues, Visual cues  Exercises Exercises: Other exercises Other Exercises Other Exercises: x 10 reps hand flexion/extension, wrist flexion/extension, supination/pronation  (demonstrated elbow ROM but unable to complete this session due to needing to have BM)    Shoulder Instructions       General Comments      Pertinent Vitals/ Pain       Pain Assessment Pain Assessment: Faces Faces Pain Scale: Hurts even more Pain Location: R UE, R hip Pain Descriptors / Indicators: Grimacing, Guarding, Discomfort, Aching, Moaning Pain Intervention(s): Limited activity within patient's tolerance, Monitored during session, Repositioned  Home Living                                          Prior Functioning/Environment              Frequency  Min 2X/week        Progress Toward Goals  OT Goals(current goals can now be found in the care plan section)  Progress towards OT goals: Progressing toward goals  Acute Rehab OT Goals Patient Stated Goal: less pain OT Goal Formulation: With patient Time For Goal Achievement: 12/03/23 Potential to Achieve Goals: Good  Plan      Co-evaluation                 AM-PAC OT 6 Clicks Daily Activity     Outcome Measure   Help from another person eating meals?: A Little Help from another person  taking care of personal grooming?: A Little Help from another person toileting, which includes using toliet, bedpan, or urinal?: Total Help from another person bathing (including washing, rinsing, drying)?: A Lot Help from another person to put on and taking off regular upper body clothing?: A Lot Help from another person to put on and taking off regular lower body clothing?: Total 6 Click Score: 12    End of Session Equipment Utilized During Treatment: Oxygen;Other (comment) (sling)  OT Visit Diagnosis: Other abnormalities of gait and mobility (R26.89);Muscle weakness (generalized) (M62.81);Pain;History of falling (Z91.81) Pain - Right/Left: Right Pain - part of body: Shoulder;Arm;Hip   Activity Tolerance Patient tolerated treatment well   Patient Left with call bell/phone within reach;in bed;with bed alarm set   Nurse Communication Mobility status;Precautions;Need for lift equipment        Time: 8875-8848 OT Time Calculation (min): 27 min  Charges: OT General Charges $OT Visit: 1 Visit OT Treatments $Self Care/Home Management :  23-37 mins  Etta NOVAK, OT Acute Rehabilitation Services Office [phone] Secure Chat Preferred    Etta GORMAN Hope 11/20/2023, 1:11 PM

## 2023-11-20 NOTE — Progress Notes (Signed)
 ORTHOPAEDIC PROGRESS NOTE  s/p Procedure(s): ARTHROPLASTY, SHOULDER, TOTAL, REVERSE  SUBJECTIVE: Frustrated today. She does not want to do therapy. She wants to know what her plan of care is.   OBJECTIVE: PE: General: sitting up in hospital bed, NAD RUE: Dressing CDI and sling well fitting,  full and painless ROM throughout hand with DPC of 0. + Motor in  AIN, PIN, Ulnar distributions. Axillary nerve sensation preserved and symmetric. Was able to appreciate slight deltoid motor function.  Sensation intact in medial, radial, and ulnar distributions. Well perfused digits.    Vitals:   11/20/23 0520 11/20/23 1031  BP: (!) 112/47 (!) 115/37  Pulse: 79 82  Resp: 16   Temp: 98.2 F (36.8 C) 97.9 F (36.6 C)  SpO2: 98% 99%    Opiates Today (MME): Today's  total administered Morphine  Milligram Equivalents: 0 Opiates Yesterday (MME): Yesterday's total administered Morphine  Milligram Equivalents: 36.5 Opiates Used in the last two days:  Inpatient Morphine  Milligram Equivalents Per Day 11/12 - 11/19   Values displayed are in units of MME/Day    Order Start / End Date 11/12 11/13 11/14  11/15 11/16 11/17  Yesterday Today    oxyCODONE  (Oxy IR/ROXICODONE ) immediate release tablet 5 mg 11/17 - 11/17 -- -- -- -- -- 0 of Unknown -- --    oxyCODONE  (ROXICODONE ) 5 MG/5ML solution 5 mg 11/17 - 11/17 -- -- -- -- -- 0 of Unknown -- --           Group total: 0 of Unknown      morphine  (PF) 4 MG/ML injection 4 mg 11/10 - 11/18 24 of 72 36 of 72 24 of 72 12 of 72 36 of 72 36 of 72 12 of 24 --    oxyCODONE  (Oxy IR/ROXICODONE ) immediate release tablet 5 mg 11/10 - 11/13 0 of 45 0 of 22.5 -- -- -- -- -- --    traMADol  (ULTRAM ) tablet 50 mg 11/13 - 11/18 -- 5 of 15 5 of 20 0 of 20 0 of 20 0 of 20 5 of 5 --    fentaNYL  (SUBLIMAZE ) injection 25-50 mcg 11/17 - 11/17 -- -- -- -- -- 30 of 45-90 -- --    fentaNYL  (SUBLIMAZE ) 100 MCG/2ML injection 11/17 - 11/17 -- -- -- -- -- 0 of Unknown -- --    fentaNYL   (SUBLIMAZE ) injection 50 mcg 11/17 - 11/17 -- -- -- -- -- 15 of 15 -- --    traMADol  (ULTRAM ) tablet 50 mg 11/18 - No end date -- -- -- -- -- -- 0 of 15 0 of 20    oxyCODONE  (Oxy IR/ROXICODONE ) immediate release tablet 5 mg 11/18 - No end date -- -- -- -- -- -- 7.5 of 22.5 0 of 30    morphine  (PF) 4 MG/ML injection 4 mg 11/18 - 11/18 -- -- -- -- -- -- 12 of 36 --    Daily Totals  24 of 117 41 of 109.5 29 of 92 12 of 92 36 of 92 81 of Unknown (at least 152-197) 36.5 of 102.5 0 of 50    Calculation Errors     Order Type Date Details   oxyCODONE  (Oxy IR/ROXICODONE ) immediate release tablet 5 mg Ordered Dose -- Insufficient frequency information   oxyCODONE  (ROXICODONE ) 5 MG/5ML solution 5 mg Ordered Dose -- Insufficient frequency information   fentaNYL  (SUBLIMAZE ) 100 MCG/2ML injection Ordered Dose -- Frequency type could not be determined           Stable post-op  images.   ASSESSMENT: Teresa Jensen is a 78 y.o. female POD#2  PLAN: Weightbearing:  - RUE: okay to use right arm for weight bearing with a walker, transfers, mobilization - okay to use right arm for ADLs at waist level - okay for passive/active ROM elbow, wrist, hand exercises - no shoulder ROM x 4 weeks - no internal rotation or backwards extension x 3 months Insicional and dressing care: Reinforce dressings as needed Orthopedic device(s): Sling - okay to remove when weight bearing with a walker, when dressing, exercises, and ADLs at waist level Showering: post-op day #2 with assistance VTE prophylaxis: Currently on Heparin . Okay to discharge on Aspirin  81 BID.  Pain control: PRN pain medication, minimize narcotics as able. Caution with narcotics as she is sensitive to medication.  Follow - up plan: 2 weeks in office Dispo: Likely SNF. Okay to discharge from orthopedic standpoint once cleared by medicine and therapies.   Contact information:  Weekdays 8-5 Dr. Bonner Hair, Aleck Stalling PA-C, After hours and holidays  please check [email or web address] for group call information for [facility]  Aleck Stalling, PA-C 11/20/23

## 2023-11-20 NOTE — Plan of Care (Signed)
 Gave pt PRN pain and anxiety meds. BM this shift. Problem: Education: Goal: Knowledge of General Education information will improve Description: Including pain rating scale, medication(s)/side effects and non-pharmacologic comfort measures Outcome: Progressing   Problem: Health Behavior/Discharge Planning: Goal: Ability to manage health-related needs will improve Outcome: Progressing   Problem: Clinical Measurements: Goal: Ability to maintain clinical measurements within normal limits will improve Outcome: Progressing Goal: Will remain free from infection Outcome: Progressing Goal: Diagnostic test results will improve Outcome: Progressing Goal: Respiratory complications will improve Outcome: Progressing Goal: Cardiovascular complication will be avoided Outcome: Progressing   Problem: Activity: Goal: Risk for activity intolerance will decrease Outcome: Progressing   Problem: Nutrition: Goal: Adequate nutrition will be maintained Outcome: Progressing   Problem: Coping: Goal: Level of anxiety will decrease Outcome: Progressing   Problem: Elimination: Goal: Will not experience complications related to bowel motility Outcome: Progressing Goal: Will not experience complications related to urinary retention Outcome: Progressing   Problem: Pain Managment: Goal: General experience of comfort will improve and/or be controlled Outcome: Progressing   Problem: Safety: Goal: Ability to remain free from injury will improve Outcome: Progressing   Problem: Skin Integrity: Goal: Risk for impaired skin integrity will decrease Outcome: Progressing

## 2023-11-20 NOTE — Progress Notes (Signed)
 [facility] PROGRESS NOTE   Teresa Jensen FMW:[id number] DOB: [birth year] DOA: 11/06/2023  PCP: Gretta Comer POUR, NP  Brief History: 78 y.o. 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  Patient sustained right pubic ramus fracture with hematoma in pelvis, right humeral head fracture. Trauma surgery cleared her, orthopedic consulted, admitted to [facility] service.    Consultants: Orthopedics  Procedures:  11/18 right reverse total shoulder arthroplasty, right open reduction internal fixation of tuberosities.     Subjective/Interval History: Patient complains of pain in the right shoulder.  Also complains of pain in the pelvic area.  No other complaints offered.    Assessment/Plan:  Right supracondylar humerus fracture 11/17 ORIF to the right shoulder per orthopedics Continue pain control with oxycodone , tramadol  and muscle relaxant with methocarbamol.  Continue PT and OT, follow up orthopedic surgery recommendations   Stable for the most part.   Closed fracture of multiple pubic rami (HCC) Supportive medical therapy Pain control, PT and Ot    New onset a-fib (HCC) Patient was placed on amiodarone drip for rate control,  Patient converted to sinus rhythm Now off amiodarone.  Patient was seen by cardiology.  Patient's episode of atrial fibrillation was very brief, lasting only about 3 hours.  Cardiology discussed with patient and decided to hold off on anticoagulation considering history of epistaxis requiring cautery.   Plan for outpatient cardiac monitoring     Essential hypertension Blood pressure is reasonably well-controlled.  Patient has noted to be on spironolactone .   Acute on chronic diastolic (congestive) heart failure (HCC) Echocardiogram with preserved LV systolic function with EF 60 to 65%, RV systolic function preserved, mild to moderate aortic stenosis, LA and RA with normal size.  Improved volume status Plan to continue with furosemide  only as needed.   Normocytic anemia Drop in hemoglobin noted.  Likely multifactorial including recent trauma as well as surgery.  Recheck labs tomorrow.  Recheck hemoglobin later today as well.   COPD No signs of acute exacerbation  Acute on chronic hypoxemic respiratory failure, multifactorial History of pulmonary fibrosis.    Chronic kidney disease, stage 3a (HCC) Today with renal function stable with serum cr at 0,99 with K at 4,9 and serum bicarbonate at 21  Na 136  Continue with spironolactone .    Hypothyroidism Continue with levothyroxine     GERD Continue pantoprazole     UTI (urinary tract infection) Klebsiella bacteremia. Patient was placed on antibiotic therapy with significant improvement No signs of spesis (ruled out)  Follow up cultures with no growth.    Obesity, class 2 Calculated BMI is 36.7    DVT Prophylaxis: Subcutaneous heparin Code Status: Full code Family Communication: Discussed with patient and her husband Disposition Plan: SNF is being pursued    Medications: Scheduled:  acetaminophen   1,000 mg Oral TID   Or   acetaminophen   650 mg Rectal TID   arformoterol  15 mcg Nebulization BID   And   umeclidinium bromide  1 puff Inhalation Daily   atorvastatin   10 mg Oral Daily   famotidine   20 mg Oral Daily   heparin injection (subcutaneous)  5,000 Units Subcutaneous Q12H  levothyroxine   75 mcg Oral Q0600   methocarbamol  500 mg Oral TID   pantoprazole   40 mg Oral QHS   pentoxifylline   400 mg Oral BID   polyethylene glycol  17 g Oral BID   senna-docusate   2 tablet Oral BID   spironolactone   12.5 mg Oral Daily   Continuous: PRN:albuterol , ALPRAZolam , ondansetron  **OR** ondansetron  (ZOFRAN ) IV, oxyCODONE , prochlorperazine, sodium chloride , traMADol   Antibiotics: Anti-infectives (From admission, onward)    Start     Dose/Rate Route Frequency Ordered Stop   11/18/23 1539  vancomycin (VANCOCIN) powder  Status:  Discontinued          As needed 11/18/23 1539 11/18/23 1624   11/18/23 1310  ceFAZolin (ANCEF) 2-4 GM/100ML-% IVPB       Note to Pharmacy: Teresa Jensen M: cabinet override      11/18/23 1310 11/19/23 0114   11/13/23 1400  ceFAZolin (ANCEF) IVPB 2g/100 mL premix        2 g 200 mL/hr over 30 Minutes Intravenous Every 8 hours 11/13/23 1200 11/17/23 2233   11/11/23 1400  ceFAZolin (ANCEF) IVPB 2g/100 mL premix  Status:  Discontinued        2 g 200 mL/hr over 30 Minutes Intravenous On call to O.R. 11/11/23 0748 11/11/23 1124   11/11/23 1004  cefTRIAXone  (ROCEPHIN ) 2 g in sodium chloride  0.9 % 100 mL IVPB  Status:  Discontinued        2 g 200 mL/hr over 30 Minutes Intravenous Every 24 hours 11/11/23 1004 11/13/23 1200   11/11/23 0800  cefTRIAXone  (ROCEPHIN ) 1 g in sodium chloride  0.9 % 100 mL IVPB  Status:  Discontinued        1 g 200 mL/hr over 30 Minutes Intravenous Every 24 hours 11/11/23 0753 11/11/23 1004       Objective:  Vital Signs  Vitals:   11/19/23 2029 11/19/23 2149 11/20/23 0520 11/20/23 1031  BP:  (!) 127/44 (!) 112/47 (!) 115/37  Pulse:  82 79 82  Resp:  14 16   Temp:  97.9 F (36.6 C) 98.2 F (36.8 C) 97.9 F (36.6 C)  TempSrc:  Oral Oral Oral  SpO2: 97% 97% 98% 99%  Weight:      Height:        Intake/Output Summary (Last 24 hours) at 11/20/2023 1137 Last data filed at 11/20/2023 1030 Gross per 24 hour  Intake 340 ml  Output 900 ml  Net -560 ml   Filed Weights   11/06/23 1640 11/14/23 0521 11/18/23 1324  Weight: 72.6 kg 78.4 kg 77.1 kg    General appearance: Awake alert.  In no distress Resp:  Clear to auscultation bilaterally.  Normal effort Cardio: S1-S2 is normal regular.  No S3-S4.  No rubs murmurs or bruit GI: Abdomen is soft.  Nontender nondistended.  Bowel sounds are present normal.  No masses organomegaly Extremities: Right arm is in a sling Neurologic: Alert and oriented x3.  No focal neurological deficits.    Lab Results:  Data Reviewed: I have personally reviewed following labs and reports of the imaging studies  CBC: Recent Labs  Lab 11/14/23 0324 11/18/23 0406 11/19/23 0542 11/20/23 0517  WBC 9.2 10.7* 12.9* 12.6*  NEUTROABS 6.3  --   --   --   HGB 8.6* 9.2* 8.5* 7.7*  HCT 27.3* 30.1* 27.8* 25.0*  MCV 93.8 96.8 95.5 96.9  PLT 313 446* 457* 422*    Basic Metabolic Panel: Recent Labs  Lab 11/14/23 0324 11/18/23 0406  11/19/23 0542 11/20/23 0517  NA 135 137 136 136  K 4.5 4.5 4.9 4.3  CL 98 101 99 99  CO2 25 23 21* 23  GLUCOSE 98 72 96 94  BUN 28* 16 23 25*  CREATININE 1.15* 0.86 0.99 1.07*  CALCIUM  8.3* 8.3* 8.0* 7.9*  MG 2.2 2.1  --   --   PHOS  --  3.8  --   --     GFR: Estimated Creatinine Clearance: 36.9 mL/min (A) (by C-G formula based on SCr of 1.07 mg/dL (H)).  Liver Function Tests: Recent Labs  Lab 11/14/23 0324  AST 26  ALT 25  ALKPHOS 59  BILITOT 0.2  PROT 6.1*  ALBUMIN 2.1*    Anemia Panel: Recent Labs    11/18/23 0406 11/18/23 0841  VITAMINB12  --  638  FOLATE 13.1  --   TIBC 241*  --   IRON 43  --     Recent Results (from the past 240 hours)  Urine Culture (for pregnant, neutropenic or urologic patients or patients with an indwelling urinary catheter)     Status: Abnormal   Collection Time: 11/10/23  5:19 PM   Specimen: Urine, Clean Catch  Result Value Ref Range Status   Specimen Description URINE, CLEAN CATCH  Final   Special Requests   Final    NONE Performed at [facility], [address] [place], [name] [address]    Culture >=100,000 COLONIES/mL KLEBSIELLA PNEUMONIAE (A)  Final   Report  Status 11/13/2023 FINAL  Final   Organism ID, Bacteria KLEBSIELLA PNEUMONIAE (A)  Final      Susceptibility   Klebsiella pneumoniae - MIC*    AMPICILLIN RESISTANT Resistant     CEFAZOLIN (URINE) Value in next row Sensitive      <=1 SENSITIVEThis is a modified FDA-approved test that has been validated and its performance characteristics determined by the reporting laboratory.  This laboratory is certified under the Clinical Laboratory Improvement Amendments CLIA as qualified to perform high complexity clinical laboratory testing.    CEFEPIME Value in next row Sensitive      <=1 SENSITIVEThis is a modified FDA-approved test that has been validated and its performance characteristics determined by the reporting laboratory.  This laboratory is certified under the Clinical Laboratory Improvement Amendments CLIA as qualified to perform high complexity clinical laboratory testing.    ERTAPENEM Value in next row Sensitive      <=1 SENSITIVEThis is a modified FDA-approved test that has been validated and its performance characteristics determined by the reporting laboratory.  This laboratory is certified under the Clinical Laboratory Improvement Amendments CLIA as qualified to perform high complexity clinical laboratory testing.    CEFTRIAXONE  Value in next row Sensitive      <=1 SENSITIVEThis is a modified FDA-approved test that has been validated and its performance characteristics determined by the reporting laboratory.  This laboratory is certified under the Clinical Laboratory Improvement Amendments CLIA as qualified to perform high complexity clinical laboratory testing.    CIPROFLOXACIN Value in next row Sensitive      <=1 SENSITIVEThis is a modified FDA-approved test that has been validated and its performance characteristics determined by the reporting laboratory.  This laboratory is certified under the Clinical Laboratory Improvement Amendments CLIA as qualified to perform high complexity clinical  laboratory testing.    GENTAMICIN Value in next row Sensitive      <=1 SENSITIVEThis is a modified FDA-approved test that has been validated and its performance characteristics  determined by the reporting laboratory.  This laboratory is certified under the Clinical Laboratory Improvement Amendments CLIA as qualified to perform high complexity clinical laboratory testing.    NITROFURANTOIN Value in next row Intermediate      <=1 SENSITIVEThis is a modified FDA-approved test that has been validated and its performance characteristics determined by the reporting laboratory.  This laboratory is certified under the Clinical Laboratory Improvement Amendments CLIA as qualified to perform high complexity clinical laboratory testing.    TRIMETH /SULFA  Value in next row Sensitive      <=1 SENSITIVEThis is a modified FDA-approved test that has been validated and its performance characteristics determined by the reporting laboratory.  This laboratory is certified under the Clinical Laboratory Improvement Amendments CLIA as qualified to perform high complexity clinical laboratory testing.    AMPICILLIN/SULBACTAM Value in next row Sensitive      <=1 SENSITIVEThis is a modified FDA-approved test that has been validated and its performance characteristics determined by the reporting laboratory.  This laboratory is certified under the Clinical Laboratory Improvement Amendments CLIA as qualified to perform high complexity clinical laboratory testing.    PIP/TAZO Value in next row Sensitive      <=4 SENSITIVEThis is a modified FDA-approved test that has been validated and its performance characteristics determined by the reporting laboratory.  This laboratory is certified under the Clinical Laboratory Improvement Amendments CLIA as qualified to perform high complexity clinical laboratory testing.    MEROPENEM Value in next row Sensitive      <=4 SENSITIVEThis is a modified FDA-approved test that has been validated and its  performance characteristics determined by the reporting laboratory.  This laboratory is certified under the Clinical Laboratory Improvement Amendments CLIA as qualified to perform high complexity clinical laboratory testing.    * >=100,000 COLONIES/mL KLEBSIELLA PNEUMONIAE  Culture, blood (Routine X 2) w Reflex to ID Panel     Status: Abnormal   Collection Time: 11/10/23  7:02 PM   Specimen: BLOOD  Result Value Ref Range Status   Specimen Description BLOOD BLOOD LEFT HAND  Final   Special Requests   Final    BOTTLES DRAWN AEROBIC AND ANAEROBIC Blood Culture results may not be optimal due to an inadequate volume of blood received in culture bottles   Culture  Setup Time   Final    GRAM NEGATIVE RODS CRITICAL VALUE NOTED.  VALUE IS CONSISTENT WITH PREVIOUSLY REPORTED AND CALLED VALUE. IN BOTH AEROBIC AND ANAEROBIC BOTTLES    Culture (A)  Final    KLEBSIELLA PNEUMONIAE SUSCEPTIBILITIES PERFORMED ON PREVIOUS CULTURE WITHIN THE LAST 5 DAYS. Performed at [facility], [address], [place], [name] [address]    Report Status 11/13/2023 FINAL  Final  Culture, blood (Routine X 2) w Reflex to ID Panel     Status: Abnormal   Collection Time: 11/10/23  7:07 PM   Specimen: BLOOD LEFT HAND  Result Value Ref Range Status   Specimen Description BLOOD LEFT HAND  Final   Special Requests   Final    BOTTLES DRAWN AEROBIC AND ANAEROBIC Blood Culture results may not be optimal due to an inadequate volume of blood received in culture bottles   Culture  Setup Time   Final    GRAM NEGATIVE RODS IN BOTH AEROBIC AND ANAEROBIC BOTTLES CRITICAL RESULT CALLED TO, READ BACK BY AND VERIFIED WITH: PHARMD E SINCLAIRE [id number] AT 1003 BY CM Performed at [facility], [address], [place], [name] [address]  Culture KLEBSIELLA PNEUMONIAE (A)  Final   Report Status 11/13/2023 FINAL  Final   Organism ID, Bacteria KLEBSIELLA PNEUMONIAE  Final      Susceptibility   Klebsiella pneumoniae - MIC*     AMPICILLIN RESISTANT Resistant     CEFAZOLIN (NON-URINE) 2 SENSITIVE Sensitive     CEFEPIME <=0.12 SENSITIVE Sensitive     ERTAPENEM <=0.12 SENSITIVE Sensitive     CEFTRIAXONE  <=0.25 SENSITIVE Sensitive     CIPROFLOXACIN <=0.06 SENSITIVE Sensitive     GENTAMICIN <=1 SENSITIVE Sensitive     MEROPENEM <=0.25 SENSITIVE Sensitive     TRIMETH /SULFA  <=20 SENSITIVE Sensitive     AMPICILLIN/SULBACTAM <=2 SENSITIVE Sensitive     PIP/TAZO Value in next row Sensitive      <=4 SENSITIVEThis is a modified FDA-approved test that has been validated and its performance characteristics determined by the reporting laboratory.  This laboratory is certified under the Clinical Laboratory Improvement Amendments CLIA as qualified to perform high complexity clinical laboratory testing.    * KLEBSIELLA PNEUMONIAE  Blood Culture ID Panel (Reflexed)     Status: Abnormal   Collection Time: 11/10/23  7:07 PM  Result Value Ref Range Status   Enterococcus faecalis NOT DETECTED NOT DETECTED Final   Enterococcus Faecium NOT DETECTED NOT DETECTED Final   Listeria monocytogenes NOT DETECTED NOT DETECTED Final   Staphylococcus species NOT DETECTED NOT DETECTED Final   Staphylococcus aureus (BCID) NOT DETECTED NOT DETECTED Final   Staphylococcus epidermidis NOT DETECTED NOT DETECTED Final   Staphylococcus lugdunensis NOT DETECTED NOT DETECTED Final   Streptococcus species NOT DETECTED NOT DETECTED Final   Streptococcus agalactiae NOT DETECTED NOT DETECTED Final   Streptococcus pneumoniae NOT DETECTED NOT DETECTED Final   Streptococcus pyogenes NOT DETECTED NOT DETECTED Final   A.calcoaceticus-baumannii NOT DETECTED NOT DETECTED Final   Bacteroides fragilis NOT DETECTED NOT DETECTED Final   Enterobacterales DETECTED (A) NOT DETECTED Final    Comment: Enterobacterales represent a large order of gram negative bacteria, not a single organism. CRITICAL RESULT CALLED TO, READ BACK BY AND VERIFIED WITH: PHARMD E SINCLAIRE  [id number] AT 103 AM BY CM    Enterobacter cloacae complex NOT DETECTED NOT DETECTED Final   Escherichia coli NOT DETECTED NOT DETECTED Final   Klebsiella aerogenes NOT DETECTED NOT DETECTED Final   Klebsiella oxytoca NOT DETECTED NOT DETECTED Final   Klebsiella pneumoniae DETECTED (A) NOT DETECTED Final    Comment: CRITICAL RESULT CALLED TO, READ BACK BY AND VERIFIED WITH: PHARMD E SINCLAIRE 111025 AT 1003 AM BY CM    Proteus species NOT DETECTED NOT DETECTED Final   Salmonella species NOT DETECTED NOT DETECTED Final   Serratia marcescens NOT DETECTED NOT DETECTED Final   Haemophilus influenzae NOT DETECTED NOT DETECTED Final   Neisseria meningitidis NOT DETECTED NOT DETECTED Final   Pseudomonas aeruginosa NOT DETECTED NOT DETECTED Final   Stenotrophomonas maltophilia NOT DETECTED NOT DETECTED Final   Candida albicans NOT DETECTED NOT DETECTED Final   Candida auris NOT DETECTED NOT DETECTED Final   Candida glabrata NOT DETECTED NOT DETECTED Final   Candida krusei NOT DETECTED NOT DETECTED Final   Candida parapsilosis NOT DETECTED NOT DETECTED Final   Candida tropicalis NOT DETECTED NOT DETECTED Final   Cryptococcus neoformans/gattii NOT DETECTED NOT DETECTED Final   CTX-M ESBL NOT DETECTED NOT DETECTED Final   Carbapenem resistance IMP NOT DETECTED NOT DETECTED Final   Carbapenem resistance KPC NOT DETECTED NOT DETECTED Final   Carbapenem resistance NDM NOT DETECTED NOT DETECTED  Final   Carbapenem resist OXA 48 LIKE NOT DETECTED NOT DETECTED Final   Carbapenem resistance VIM NOT DETECTED NOT DETECTED Final    Comment: Performed at [facility], [address], [place], [name] [address]  Culture, blood (Routine X 2) w Reflex to ID Panel     Status: None   Collection Time: 11/13/23  6:34 AM   Specimen: BLOOD LEFT HAND  Result Value Ref Range Status   Specimen Description BLOOD LEFT HAND  Final   Special Requests   Final    BOTTLES DRAWN AEROBIC AND ANAEROBIC Blood Culture  adequate volume   Culture   Final    NO GROWTH 5 DAYS Performed at [facility], [address], [place], [name] [address]    Report Status 11/18/2023 FINAL  Final  Culture, blood (Routine X 2) w Reflex to ID Panel     Status: None   Collection Time: 11/13/23  6:43 AM   Specimen: BLOOD LEFT HAND  Result Value Ref Range Status   Specimen Description BLOOD LEFT HAND  Final   Special Requests   Final    BOTTLES DRAWN AEROBIC AND ANAEROBIC Blood Culture results may not be optimal due to an inadequate volume of blood received in culture bottles   Culture   Final    NO GROWTH 5 DAYS Performed at [facility], [address], [place], [name] [address]    Report Status 11/18/2023 FINAL  Final      Radiology Studies: DG Shoulder Right Port Result Date: 11/19/2023 CLINICAL DATA:  Postoperative evaluation. EXAM: RIGHT SHOULDER - 1 VIEW COMPARISON:  11/07/2023. FINDINGS: Status post right reverse shoulder arthroplasty. The glenoid and humeral prosthetic components appear aligned. Acromioclavicular joint is anatomically aligned. No appreciable acute complication. Expected postoperative soft tissue swelling and intra-articular air. IMPRESSION: Status post right reverse shoulder arthroplasty. No evidence of acute complication. Electronically Signed   By: Harrietta Sherry M.D.   On: 11/19/2023 10:07       LOS: 14 days   Shastina Rua [employer] on [email or web address]  11/20/2023, 11:37 AM

## 2023-11-21 DIAGNOSIS — S42411A Displaced simple supracondylar fracture without intercondylar fracture of right humerus, initial encounter for closed fracture: Secondary | ICD-10-CM | POA: Diagnosis not present

## 2023-11-21 DIAGNOSIS — D649 Anemia, unspecified: Secondary | ICD-10-CM | POA: Diagnosis not present

## 2023-11-21 DIAGNOSIS — S32591A Other specified fracture of right pubis, initial encounter for closed fracture: Secondary | ICD-10-CM | POA: Diagnosis not present

## 2023-11-21 LAB — BASIC METABOLIC PANEL WITH GFR
Anion gap: 15 (ref 5–15)
BUN: 22 mg/dL (ref 8–23)
CO2: 23 mmol/L (ref 22–32)
Calcium: 7.8 mg/dL — ABNORMAL LOW (ref 8.9–10.3)
Chloride: 98 mmol/L (ref 98–111)
Creatinine, Ser: 0.92 mg/dL (ref 0.44–1.00)
GFR, Estimated: >60 mL/min (ref >60)
Glucose, Bld: 91 mg/dL (ref 70–99)
Potassium: 3.9 mmol/L (ref 3.5–5.1)
Sodium: 136 mmol/L (ref 135–145)

## 2023-11-21 LAB — CBC
HCT: 22.8 % — ABNORMAL LOW (ref 36.0–46.0)
Hemoglobin: 7.2 g/dL — ABNORMAL LOW (ref 12.0–15.0)
MCH: 30.3 pg (ref 26.0–34.0)
MCHC: 31.6 g/dL (ref 30.0–36.0)
MCV: 95.8 fL (ref 80.0–100.0)
Platelets: 464 K/uL — ABNORMAL HIGH (ref 150–400)
RBC: 2.38 MIL/uL — ABNORMAL LOW (ref 3.87–5.11)
RDW: 14.4 % (ref 11.5–15.5)
WBC: 11.2 K/uL — ABNORMAL HIGH (ref 4.0–10.5)
nRBC: 0 % (ref 0.0–0.2)

## 2023-11-21 LAB — IRON AND TIBC
Iron: 24 ug/dL — ABNORMAL LOW (ref 28–170)
Saturation Ratios: 12 % (ref 10.4–31.8)
TIBC: 203 ug/dL — ABNORMAL LOW (ref 250–450)
UIBC: 179 ug/dL

## 2023-11-21 LAB — VITAMIN B12: Vitamin B-12: 457 pg/mL (ref 180–914)

## 2023-11-21 LAB — RETICULOCYTES
Immature Retic Fract: 27.1 % — ABNORMAL HIGH (ref 2.3–15.9)
RBC.: 2.45 MIL/uL — ABNORMAL LOW (ref 3.87–5.11)
Retic Count, Absolute: 92.6 K/uL (ref 19.0–186.0)
Retic Ct Pct: 3.8 % — ABNORMAL HIGH (ref 0.4–3.1)

## 2023-11-21 LAB — FOLATE: Folate: 10.3 ng/mL (ref >5.9)

## 2023-11-21 LAB — HEMOGLOBIN AND HEMATOCRIT, BLOOD
HCT: 33.4 % — ABNORMAL LOW (ref 36.0–46.0)
Hemoglobin: 11 g/dL — ABNORMAL LOW (ref 12.0–15.0)

## 2023-11-21 LAB — PREPARE RBC (CROSSMATCH)

## 2023-11-21 LAB — FERRITIN: Ferritin: 257 ng/mL (ref 11–307)

## 2023-11-21 LAB — GLUCOSE, CAPILLARY: Glucose-Capillary: 139 mg/dL — ABNORMAL HIGH (ref 70–99)

## 2023-11-21 MED ORDER — FUROSEMIDE 10 MG/ML IJ SOLN
20.0000 mg | Freq: Once | INTRAMUSCULAR | Status: AC
  Administered 2023-11-21: 20 mg via INTRAVENOUS
  Filled 2023-11-21: qty 2

## 2023-11-21 MED ORDER — METOPROLOL TARTRATE 12.5 MG HALF TABLET
12.5000 mg | ORAL_TABLET | Freq: Two times a day (BID) | ORAL | Status: DC
  Administered 2023-11-21 – 2023-11-22 (×3): 12.5 mg via ORAL
  Filled 2023-11-21 (×3): qty 1

## 2023-11-21 MED ORDER — SODIUM CHLORIDE 0.9% IV SOLUTION: Freq: Once | INTRAVENOUS | Status: AC

## 2023-11-21 NOTE — Plan of Care (Signed)
 Patient received 2 units blood today, no signs and symptoms. Patient was educated abt the blood administration. Needs attended. Kept safe. Bed alarm on. Husband was at bedside. Problem: Education: Goal: Knowledge of General Education information will improve Description: Including pain rating scale, medication(s)/side effects and non-pharmacologic comfort measures Outcome: Progressing   Problem: Health Behavior/Discharge Planning: Goal: Ability to manage health-related needs will improve Outcome: Progressing   Problem: Clinical Measurements: Goal: Ability to maintain clinical measurements within normal limits will improve Outcome: Progressing Goal: Will remain free from infection Outcome: Progressing Goal: Diagnostic test results will improve Outcome: Progressing Goal: Respiratory complications will improve Outcome: Progressing Goal: Cardiovascular complication will be avoided Outcome: Progressing   Problem: Activity: Goal: Risk for activity intolerance will decrease Outcome: Progressing   Problem: Nutrition: Goal: Adequate nutrition will be maintained Outcome: Progressing   Problem: Coping: Goal: Level of anxiety will decrease Outcome: Progressing   Problem: Elimination: Goal: Will not experience complications related to bowel motility Outcome: Progressing Goal: Will not experience complications related to urinary retention Outcome: Progressing   Problem: Pain Managment: Goal: General experience of comfort will improve and/or be controlled Outcome: Progressing   Problem: Safety: Goal: Ability to remain free from injury will improve Outcome: Progressing   Problem: Skin Integrity: Goal: Risk for impaired skin integrity will decrease Outcome: Progressing

## 2023-11-21 NOTE — Progress Notes (Signed)
 [facility] PROGRESS NOTE   Teresa Jensen FMW:[id number] DOB: Jan 02, 1945 DOA: 11/06/2023  PCP: Teresa Comer POUR, NP  Brief History: 78 y.o. 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  Patient sustained right pubic ramus fracture with hematoma in pelvis, right humeral head fracture. Trauma surgery cleared her, orthopedic consulted, admitted to [facility] service.   Consultants: Orthopedics  Procedures:  11/18 right reverse total shoulder arthroplasty, right open reduction internal fixation of tuberosities.   Subjective/Interval History: Patient mentions that she is feeling little better this morning.  However still complaining of significant weakness overall.  Lack of strength.  She was told that this could be due to her anemia.  Otherwise she mentions less pain in her right shoulder and pelvic area.    Assessment/Plan:  Right supracondylar humerus fracture 11/17 ORIF to the right shoulder Continue pain control with oxycodone , tramadol  and muscle relaxant with methocarbamol .  Continue PT and OT, follow up orthopedic surgery recommendations   Stable for the most part.   Closed fracture of multiple pubic rami (HCC) Supportive medical therapy Pain control, PT and Ot    New onset a-fib (HCC) Patient was placed on amiodarone  drip for rate control,  Patient converted to sinus rhythm Now off amiodarone .  Patient was seen by cardiology.  Patient's episode of atrial fibrillation was very brief, lasting only about 3 hours.  Cardiology discussed with patient and decided to  hold off on anticoagulation considering history of epistaxis requiring cautery.   Plan for outpatient cardiac monitoring    Essential hypertension Blood pressure is reasonably well-controlled.  Patient has noted to be on spironolactone .   Acute on chronic diastolic (congestive) heart failure (HCC) Echocardiogram with preserved LV systolic function with EF 60 to 65%, RV systolic function preserved, mild to moderate aortic stenosis, LA and RA with normal size.  Improved volume status Plan to continue with furosemide  only as needed.   Normocytic anemia/symptomatic anemia Drop in hemoglobin is likely multifactorial including recent trauma as well as surgery.  Patient is symptomatic.  Will transfuse her 2 units of PRBC.   Anemia panel reviewed.  Ferritin 257, iron 24, TIBC 203, percent saturation 12.  Folic acid 10.3.  B12 457.   COPD No signs of acute exacerbation  Acute on chronic hypoxemic respiratory failure, multifactorial History of pulmonary fibrosis.    Chronic kidney disease, stage 3a Renal function is stable.     Hypothyroidism Continue with levothyroxine     GERD Continue pantoprazole     UTI (urinary tract infection) Klebsiella bacteremia. Patient was placed on antibiotic therapy with significant improvement No signs of spesis (ruled out)  Follow up cultures with no growth.    Obesity, class 2 Calculated BMI is 36.7    DVT Prophylaxis: Subcutaneous heparin  Code Status: Full code Family Communication: Discussed with patient Disposition Plan: SNF is being pursued    Medications: Scheduled:  acetaminophen   1,000 mg Oral TID   Or   acetaminophen   650 mg Rectal TID   arformoterol   15 mcg Nebulization BID   And   umeclidinium bromide   1 puff Inhalation Daily   atorvastatin   10 mg Oral Daily  famotidine   20 mg Oral Daily   heparin injection (subcutaneous)  5,000 Units Subcutaneous Q12H   levothyroxine   75 mcg Oral Q0600   methocarbamol  500 mg Oral TID    pantoprazole   40 mg Oral QHS   pentoxifylline   400 mg Oral BID   polyethylene glycol  17 g Oral BID   senna-docusate  2 tablet Oral BID   spironolactone   12.5 mg Oral Daily   Continuous: PRN:albuterol , ALPRAZolam , ondansetron  **OR** ondansetron  (ZOFRAN ) IV, oxyCODONE , prochlorperazine, sodium chloride , traMADol   Antibiotics: Anti-infectives (From admission, onward)    Start     Dose/Rate Route Frequency Ordered Stop   11/18/23 1539  vancomycin (VANCOCIN) powder  Status:  Discontinued          As needed 11/18/23 1539 11/18/23 1624   11/18/23 1310  ceFAZolin (ANCEF) 2-4 GM/100ML-% IVPB       Note to Pharmacy: Teresa Jensen M: cabinet override      11/18/23 1310 11/19/23 0114   11/13/23 1400  ceFAZolin (ANCEF) IVPB 2g/100 mL premix        2 g 200 mL/hr over 30 Minutes Intravenous Every 8 hours 11/13/23 1200 11/17/23 2233   11/11/23 1400  ceFAZolin (ANCEF) IVPB 2g/100 mL premix  Status:  Discontinued        2 g 200 mL/hr over 30 Minutes Intravenous On call to O.R. 11/11/23 0748 11/11/23 1124   11/11/23 1004  cefTRIAXone  (ROCEPHIN ) 2 g in sodium chloride  0.9 % 100 mL IVPB  Status:  Discontinued        2 g 200 mL/hr over 30 Minutes Intravenous Every 24 hours 11/11/23 1004 11/13/23 1200   11/11/23 0800  cefTRIAXone  (ROCEPHIN ) 1 g in sodium chloride  0.9 % 100 mL IVPB  Status:  Discontinued        1 g 200 mL/hr over 30 Minutes Intravenous Every 24 hours 11/11/23 0753 11/11/23 1004       Objective:  Vital Signs  Vitals:   11/21/23 0444 11/21/23 0838 11/21/23 0906 11/21/23 0935  BP: 94/76 (!) 146/44 (!) 126/39 (!) 123/35  Pulse: 74 92 81 75  Resp: 18 17 17 17   Temp: 97.9 F (36.6 C) (!) 97.5 F (36.4 C) 98.9 F (37.2 C) 98.8 F (37.1 C)  TempSrc: Oral Oral Oral Oral  SpO2: 97% 99% 100% 100%  Weight:      Height:        Intake/Output Summary (Last 24 hours) at 11/21/2023 0947 Last data filed at 11/21/2023 0700 Gross per 24 hour  Intake 600 ml  Output 550 ml  Net 50 ml    Filed Weights   11/06/23 1640 11/14/23 0521 11/18/23 1324  Weight: 72.6 kg 78.4 kg 77.1 kg    General appearance: Awake alert.  In no distress Resp: Clear to auscultation bilaterally.  Normal effort Cardio: S1-S2 is normal regular.  No S3-S4.  No rubs murmurs or bruit GI: Abdomen is soft.  Nontender nondistended.  Bowel sounds are present normal.  No masses organomegaly  Lab Results:  Data Reviewed: I have personally reviewed following labs and reports of the imaging studies  CBC: Recent Labs  Lab 11/18/23 0406 11/19/23 0542 11/20/23 0517 11/20/23 1153 11/21/23 0506  WBC 10.7* 12.9* 12.6*  --  11.2*  HGB 9.2* 8.5* 7.7* 7.8* 7.2*  HCT 30.1* 27.8* 25.0* 25.1* 22.8*  MCV 96.8 95.5 96.9  --  95.8  PLT 446* 457* 422*  --  464*    Basic Metabolic Panel: Recent Labs  Lab  11/18/23 0406 11/19/23 0542 11/20/23 0517 11/21/23 0506  NA 137 136 136 136  K 4.5 4.9 4.3 3.9  CL 101 99 99 98  CO2 23 21* 23 23  GLUCOSE 72 96 94 91  BUN 16 23 25* 22  CREATININE 0.86 0.99 1.07* 0.92  CALCIUM  8.3* 8.0* 7.9* 7.8*  MG 2.1  --   --   --   PHOS 3.8  --   --   --     GFR: Estimated Creatinine Clearance: 43 mL/min (by C-G formula based on SCr of 0.92 mg/dL).  Anemia Panel: Recent Labs    11/21/23 0506  VITAMINB12 457  FOLATE 10.3  FERRITIN 257  TIBC 203*  IRON 24*  RETICCTPCT 3.8*    Recent Results (from the past 240 hours)  Culture, blood (Routine X 2) w Reflex to ID Panel     Status: None   Collection Time: 11/13/23  6:34 AM   Specimen: BLOOD LEFT HAND  Result Value Ref Range Status   Specimen Description BLOOD LEFT HAND  Final   Special Requests   Final    BOTTLES DRAWN AEROBIC AND ANAEROBIC Blood Culture adequate volume   Culture   Final    NO GROWTH 5 DAYS Performed at [facility], [address] [place], [name] [address]    Report Status 11/18/2023 FINAL  Final  Culture, blood (Routine X 2) w Reflex to ID Panel     Status: None   Collection Time:  11/13/23  6:43 AM   Specimen: BLOOD LEFT HAND  Result Value Ref Range Status   Specimen Description BLOOD LEFT HAND  Final   Special Requests   Final    BOTTLES DRAWN AEROBIC AND ANAEROBIC Blood Culture results may not be optimal due to an inadequate volume of blood received in culture bottles   Culture   Final    NO GROWTH 5 DAYS Performed at [facility], [address] [place], [name] [address]    Report Status 11/18/2023 FINAL  Final      Radiology Studies: No results found.      LOS: 15 days   Gunda Maqueda  [facility] Pager on [email or web address]  11/21/2023, 9:47 AM

## 2023-11-21 NOTE — Plan of Care (Signed)
   Problem: Education: Goal: Knowledge of General Education information will improve Description Including pain rating scale, medication(s)/side effects and non-pharmacologic comfort measures Outcome: Progressing

## 2023-11-21 NOTE — Progress Notes (Signed)
 Physical Therapy Treatment Patient Details Name: Teresa Jensen MRN: [id number] DOB: [birth year] Today's Date: 11/21/2023   History of Present Illness Pt is a 78 y.o. female who presented 11/06/23 after fall. Xrays of the right shoulder demonstrate a displaced proximal humerus fracture. CT pelvis demonstrates right sided pubic rami fractures. Non-op management of RLE, 11/17 R reverse total shoulder arthroplasty and ORIF of tuberosities. 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    PT Comments  Pt resting in bed on arrival, agreeable to session with slow but steady progress towards acute goals. Pt continues to be limited in safe mobility by pain, global weakness, decreased activity tolerance and impaired balance/postural reactions. Pt requiring up to mod A +2 to complete bed mobility, transfers sit<>stand, stand pivot transfer and step pivot transfer to<>from BSC. Pt declining RW use this session due to increased RUE pain, opting for HHA of 1-2 for all standing mobility. Pt returning to supine at end of session due to fatigue and receptive to education on importance of frequent mobilization to maximize functional mobility gains. Pt continues to benefit from skilled PT services to progress toward functional mobility goals.      If plan is discharge home, recommend the following: Two people to help with walking and/or transfers;A lot of help with bathing/dressing/bathroom;Assistance with cooking/housework;Assist for transportation;Help with stairs or ramp for entrance   Can travel by private vehicle     No  Equipment Recommendations  Hospital bed;Wheelchair (measurements PT);Wheelchair cushion (measurements PT)    Recommendations for  Other Services       Precautions / Restrictions Precautions Precautions: Fall;Shoulder Type of Shoulder Precautions: NO shoulder ROM x 4 weeks, okay to use R UE for waist level ADLs, A/PROM of elbow/wrist/hand Shoulder Interventions: Shoulder sling/immobilizer;Off for dressing/bathing/exercises (off for transfers) Precaution Booklet Issued: Yes (comment) Recall of Precautions/Restrictions: Impaired Required Braces or Orthoses: Sling Restrictions Weight Bearing Restrictions Per Provider Order: Yes RUE Weight Bearing Per Provider Order: Non weight bearing RLE Weight Bearing Per Provider Order: Weight bearing as tolerated Other Position/Activity Restrictions: R UE WBAT for transfers using RW     Mobility  Bed Mobility Overal bed mobility: Needs Assistance Bed Mobility: Supine to Sit, Sit to Supine     Supine to sit: Mod assist, HOB elevated, Used rails Sit to supine: HOB elevated, Used rails, Max assist   General bed mobility comments: mod assist to roll towards L side, mod assist to pull up to [facility]    Transfers Overall transfer level: Needs assistance Equipment used: 2 person hand held assist, 1 person hand held assist Transfers: Sit to/from Stand, Bed to chair/wheelchair/BSC Sit to Stand: Mod assist, +2 physical assistance Stand pivot transfers: Mod assist, +2 physical assistance Step pivot transfers: Mod assist, +2 physical assistance       General transfer comment: pt requirning heavy mod A +2 o rise from EOB on initial stand, fading to mod A from [facility], pt performing stand pivot EOB>BSC and step pivot BSC>EOB, pt requiring mod A +2 to maintain balance with each, pt with low clearance and knee flexion on L with stepping    Ambulation/Gait               General Gait Details: pt unable to pick up feet in standing, is sliding them heel-toe due to pain  Stairs             Wheelchair Mobility     Tilt Bed    Modified Rankin (Stroke Patients Only)        Balance Overall balance assessment: Needs assistance Sitting-balance support: No upper extremity supported, Feet supported Sitting balance-Leahy Scale: Fair Sitting balance - Comments: needing contact guard staticially for safety   Standing balance support: Single extremity supported, Bilateral upper extremity supported, During functional activity, Reliant on assistive device for balance Standing balance-Leahy Scale: Poor Standing balance comment: relies on UE and external support, slight L lateral lean                            Communication Communication Communication: No apparent difficulties  Cognition Arousal: Alert Behavior During Therapy: WFL for tasks assessed/performed, Anxious   PT - Cognitive impairments: No apparent impairments                         Following commands: Impaired Following commands impaired: Follows one step commands with increased time, Follows multi-step commands inconsistently    Cueing Cueing Techniques: Verbal cues, Visual cues  Exercises      General Comments General comments (skin integrity, edema, etc.): VSS on supplemental O2, husband present and supportive      Pertinent Vitals/Pain Pain Assessment Pain Assessment: Faces Faces Pain Scale: Hurts even more Pain Location: R UE, R hip Pain Descriptors / Indicators: Grimacing, Guarding, Discomfort, Aching, Moaning Pain Intervention(s): Monitored during session, Limited activity within patient's tolerance, Repositioned    Home Living                          Prior Function            PT Goals (current goals can now be found in the care plan section) Acute Rehab PT Goals Patient Stated Goal: Have less pain and move better PT Goal Formulation: With patient/family Time For Goal Achievement: 12/03/23 Progress towards PT goals: Progressing toward goals    Frequency    Min 2X/week      PT Plan      Co-evaluation              AM-PAC PT  6 Clicks Mobility   Outcome Measure  Help needed turning from your back to your side while in a flat bed without using bedrails?: A Lot Help needed moving from lying on your back to sitting on the side of a flat bed without using bedrails?: A Lot Help needed moving to and from a bed to a chair (including a wheelchair)?: Total Help needed standing up from a chair using your arms (e.g., wheelchair or bedside chair)?: Total Help needed to walk in hospital room?: Total Help needed climbing 3-5 steps with a railing? : Total 6 Click Score: 8    End of Session Equipment Utilized During Treatment: Gait belt;Oxygen Activity Tolerance: Patient limited by pain;Patient tolerated treatment well Patient left: with call bell/phone within reach;in bed;with bed alarm set;with nursing/sitter in room Nurse Communication: Mobility status PT Visit Diagnosis: Pain;Difficulty in walking, not elsewhere classified (R26.2);Other abnormalities of gait and mobility (R26.89);Unsteadiness on feet (R26.81);Repeated falls (R29.6);Muscle weakness (generalized) (M62.81);History of falling (Z91.81) Pain - Right/Left: Right Pain - part of body: Shoulder;Hip     Time: 1441-1519 (pt on BSC for extended time with time not billed) PT Time Calculation (min) (ACUTE ONLY): 38 min  Charges:    $Therapeutic Activity: 23-37 mins PT General Charges $$ ACUTE PT VISIT: 1 Visit                     Delyla Sandeen R. PTA Acute Rehabilitation Services Office: [phone]   Therisa CHRISTELLA Boor 11/21/2023, 3:38 PM

## 2023-11-21 NOTE — TOC Progression Note (Signed)
 Transition of Care [facility]) - Progression Note    Patient Details  Name: Teresa Jensen MRN: [id number] Date of Birth: 07/08/1945  [facility]) CM/SW Contact  Elgar Scoggins LITTIE [place], [place] Phone Number: 11/21/2023, 10:01 AM  Clinical Narrative:    CSW confirmed bed availability with [facility], MD stated plan is to DC tomorrow, CSW confirmed with facility and will continue to follow.   Expected Discharge Plan: Skilled Nursing Facility Barriers to Discharge: Continued Medical Work up               Expected Discharge Plan and Services In-house Referral: Clinical Social Work     Living arrangements for the past 2 months: Single Family Home                                       Social Drivers of Health (SDOH) Interventions SDOH Screenings   Food Insecurity: Patient Declined (11/08/2023)  Housing: Unknown (11/08/2023)  Transportation Needs: Patient Declined (11/08/2023)  Utilities: Patient Declined (11/08/2023)  Alcohol Screen: Low Risk  (05/07/2022)  Depression (PHQ2-9): Low Risk  (08/13/2023)  Financial Resource Strain: Patient Declined (05/01/2023)  Physical Activity: Sufficiently Active (05/01/2023)  Social Connections: Unknown (11/08/2023)  Stress: No Stress Concern Present (05/01/2023)  Tobacco Use: Medium Risk (11/18/2023)  Health Literacy: Adequate Health Literacy (05/08/2023)    Readmission Risk Interventions     No data to display

## 2023-11-22 ENCOUNTER — Other Ambulatory Visit: Payer: Self-pay | Admitting: Cardiology

## 2023-11-22 ENCOUNTER — Encounter: Admitting: Family

## 2023-11-22 DIAGNOSIS — I480 Paroxysmal atrial fibrillation: Secondary | ICD-10-CM

## 2023-11-22 DIAGNOSIS — S42411A Displaced simple supracondylar fracture without intercondylar fracture of right humerus, initial encounter for closed fracture: Secondary | ICD-10-CM | POA: Diagnosis not present

## 2023-11-22 LAB — BASIC METABOLIC PANEL WITH GFR
Anion gap: 11 (ref 5–15)
BUN: 21 mg/dL (ref 8–23)
CO2: 23 mmol/L (ref 22–32)
Calcium: 7.9 mg/dL — ABNORMAL LOW (ref 8.9–10.3)
Chloride: 101 mmol/L (ref 98–111)
Creatinine, Ser: 0.94 mg/dL (ref 0.44–1.00)
GFR, Estimated: >60 mL/min (ref >60)
Glucose, Bld: 81 mg/dL (ref 70–99)
Potassium: 4.1 mmol/L (ref 3.5–5.1)
Sodium: 135 mmol/L (ref 135–145)

## 2023-11-22 LAB — BPAM RBC
Blood Product Expiration Date: 202512082359
Blood Product Expiration Date: 202512082359
ISSUE DATE / TIME: 202511200852
ISSUE DATE / TIME: 202511201241
Unit Type and Rh: 6200
Unit Type and Rh: 6200

## 2023-11-22 LAB — TYPE AND SCREEN
ABO/RH(D): A POS
Antibody Screen: NEGATIVE
Unit division: 0
Unit division: 0

## 2023-11-22 LAB — CBC
HCT: 32.8 % — ABNORMAL LOW (ref 36.0–46.0)
Hemoglobin: 10.8 g/dL — ABNORMAL LOW (ref 12.0–15.0)
MCH: 30.2 pg (ref 26.0–34.0)
MCHC: 32.9 g/dL (ref 30.0–36.0)
MCV: 91.6 fL (ref 80.0–100.0)
Platelets: 507 K/uL — ABNORMAL HIGH (ref 150–400)
RBC: 3.58 MIL/uL — ABNORMAL LOW (ref 3.87–5.11)
RDW: 15.9 % — ABNORMAL HIGH (ref 11.5–15.5)
WBC: 10.3 K/uL (ref 4.0–10.5)
nRBC: 0 % (ref 0.0–0.2)

## 2023-11-22 MED ORDER — POLYETHYLENE GLYCOL 3350 17 G PO PACK: 17.0000 g | PACK | Freq: Two times a day (BID) | ORAL | Status: AC

## 2023-11-22 MED ORDER — FUROSEMIDE 40 MG PO TABS: 40.0000 mg | ORAL_TABLET | Freq: Every day | ORAL | Status: AC | PRN

## 2023-11-22 MED ORDER — SENNOSIDES-DOCUSATE SODIUM 8.6-50 MG PO TABS: 2.0000 | ORAL_TABLET | Freq: Two times a day (BID) | ORAL | Status: AC

## 2023-11-22 MED ORDER — METOPROLOL TARTRATE 25 MG PO TABS: 12.5000 mg | ORAL_TABLET | Freq: Two times a day (BID) | ORAL | Status: DC

## 2023-11-22 NOTE — TOC Transition Note (Signed)
 Transition of Care [facility]) - Discharge Note   Patient Details  Name: Teresa Jensen MRN: [id number] Date of Birth: [birth year]  [facility]) CM/SW Contact:  Teresa Jensen Maranda ISRA[place] Phone Number: 11/22/2023, 9:43 AM   Clinical Narrative:    Patient will DC to: [facility] Anticipated DC date: 11/22/23 Family notified: Yes  Transport by: [facility]   Per MD patient ready for DC to [facility]. RN to call report prior to discharge [phone] room [phone]. RN, patient, patient's family, and facility notified of DC. Discharge Summary and FL2 sent to facility. DC packet on chart. Ambulance transport requested for patient.   CSW will sign off for now as social work intervention is no longer needed. Please consult us  again if new needs arise.     Final next level of care: Skilled Nursing Facility Barriers to Discharge: Barriers Resolved   Patient Goals and CMS Choice Patient states their goals for this hospitalization and ongoing recovery are:: SNF          Discharge Placement   Existing PASRR number confirmed : 11/22/23          Patient [facility] bed at: [facility] Patient to be transferred to facility by: [facility] Name of family member notified: Lonni Patient and family notified of of transfer: 11/22/23  Discharge Plan and Services Additional resources added to the After Visit Summary for   In-house Referral: Clinical Social Work                                   Social Drivers of Health (SDOH) Interventions SDOH Screenings   Food Insecurity: Patient Declined (11/08/2023)  Housing: Unknown (11/08/2023)  Transportation Needs: Patient Declined (11/08/2023)  Utilities: Patient Declined (11/08/2023)  Alcohol Screen: Low Risk  (05/07/2022)  Depression (PHQ2-9): Low Risk  (08/13/2023)  Financial Resource Strain: Patient Declined (05/01/2023)  Physical Activity: Sufficiently Active (05/01/2023)  Social Connections: Unknown (11/08/2023)  Stress: No Stress Concern Present  (05/01/2023)  Tobacco Use: Medium Risk (11/18/2023)  Health Literacy: Adequate Health Literacy (05/08/2023)     Readmission Risk Interventions     No data to display

## 2023-11-22 NOTE — Progress Notes (Signed)
 ORTHOPAEDIC PROGRESS NOTE  s/p Procedure(s): ARTHROPLASTY, SHOULDER, TOTAL, REVERSE  SUBJECTIVE: Feeling some better today. Wants to know discharge plan.   OBJECTIVE: PE: General: sitting up in hospital bed, NAD RUE: Dressing CDI and sling well fitting,  full and painless ROM throughout hand with DPC of 0. + Motor in  AIN, PIN, Ulnar distributions. Axillary nerve sensation preserved and symmetric. Was able to appreciate slight deltoid motor function.  Sensation intact in medial, radial, and ulnar distributions. Well perfused digits.    Vitals:   11/21/23 2124 11/22/23 0354  BP: (!) 125/48 (!) 137/48  Pulse: 70 65  Resp:  18  Temp: 97.6 F (36.4 C) (!) 97.5 F (36.4 C)  SpO2: 98% 98%    Opiates Today (MME): Today's  total administered Morphine  Milligram Equivalents: 0 Opiates Yesterday (MME): Yesterday's total administered Morphine  Milligram Equivalents: 7.5 Opiates Used in the last two days:  Inpatient Morphine  Milligram Equivalents Per Day 11/14 - 11/21   Values displayed are in units of MME/Day    Order Start / End Date 11/14 11/15 11/16  11/17 11/18 11/19  Yesterday Today    oxyCODONE  (Oxy IR/ROXICODONE ) immediate release tablet 5 mg 11/17 - 11/17 -- -- -- 0 of Unknown -- -- -- --    oxyCODONE  (ROXICODONE ) 5 MG/5ML solution 5 mg 11/17 - 11/17 -- -- -- 0 of Unknown -- -- -- --         Group total: 0 of Unknown        morphine  (PF) 4 MG/ML injection 4 mg 11/10 - 11/18 24 of 72 12 of 72 36 of 72 36 of 72 12 of 24 -- -- --    traMADol  (ULTRAM ) tablet 50 mg 11/13 - 11/18 5 of 20 0 of 20 0 of 20 0 of 20 5 of 5 -- -- --    fentaNYL  (SUBLIMAZE ) injection 25-50 mcg 11/17 - 11/17 -- -- -- 30 of 45-90 -- -- -- --    fentaNYL  (SUBLIMAZE ) 100 MCG/2ML injection 11/17 - 11/17 -- -- -- 0 of Unknown -- -- -- --    fentaNYL  (SUBLIMAZE ) injection 50 mcg 11/17 - 11/17 -- -- -- 15 of 15 -- -- -- --    traMADol  (ULTRAM ) tablet 50 mg 11/18 - No end date -- -- -- -- 0 of 15 0 of 20 0 of 20 0 of  20    oxyCODONE  (Oxy IR/ROXICODONE ) immediate release tablet 5 mg 11/18 - No end date -- -- -- -- 7.5 of 22.5 7.5 of 30 7.5 of 30 0 of 30    morphine  (PF) 4 MG/ML injection 4 mg 11/18 - 11/18 -- -- -- -- 12 of 36 -- -- --    Daily Totals  29 of 92 12 of 92 36 of 92 81 of Unknown (at least 152-197) 36.5 of 102.5 7.5 of 50 7.5 of 50 0 of 50    Calculation Errors     Order Type Date Details   oxyCODONE  (Oxy IR/ROXICODONE ) immediate release tablet 5 mg Ordered Dose -- Insufficient frequency information   oxyCODONE  (ROXICODONE ) 5 MG/5ML solution 5 mg Ordered Dose -- Insufficient frequency information   fentaNYL  (SUBLIMAZE ) 100 MCG/2ML injection Ordered Dose -- Frequency type could not be determined           Stable post-op images.   ASSESSMENT: Teresa Jensen is a 78 y.o. female POD#4  PLAN: Weightbearing:  - RUE: okay to use right arm for weight bearing with a walker, transfers, mobilization -  okay to use right arm for ADLs at waist level - okay for passive/active ROM elbow, wrist, hand exercises - no shoulder ROM x 4 weeks - no internal rotation or backwards extension x 3 months Insicional and dressing care: Reinforce dressings as needed Orthopedic device(s): Sling - okay to remove when weight bearing with a walker, when dressing, exercises, and ADLs at waist level Showering: post-op day #2 with assistance VTE prophylaxis: Currently on Heparin . Okay to discharge on Aspirin  81 BID.  Pain control: PRN pain medication, minimize narcotics as able. Caution with narcotics as she is sensitive to medication.  Follow - up plan: 2 weeks in office Dispo: Likely SNF. Okay to discharge from orthopedic standpoint once cleared by medicine and therapies.   Contact information:  Weekdays 8-5 Dr. Bonner Hair, Aleck Stalling PA-C, After hours and holidays please check [email or web address] for group call information for [facility]  Aleck Stalling, PA-C 11/22/23

## 2023-11-22 NOTE — Discharge Summary (Addendum)
 [facility]  Physician Discharge Summary   Patient ID: Teresa Jensen MRN: [id number] DOB/AGE: 04-Apr-1945 78 y.o.  Admit date: 11/06/2023 Discharge date:   11/22/2023   PCP: Gretta Comer POUR, NP  DISCHARGE DIAGNOSES:    Right supracondylar humerus fracture   Closed fracture of multiple pubic rami (HCC)   Essential hypertension   New onset paroxysmal a-fib (HCC) PSVT   Acute on chronic diastolic (congestive) heart failure (HCC)   COPD   Chronic kidney disease, stage 3a (HCC)   Hypothyroidism   GERD   UTI (urinary tract infection)   Chronic anemia   Obesity, class 2   RECOMMENDATIONS FOR OUTPATIENT FOLLOW UP: Please check CBC and basic metabolic panel in 3 to 4 days Follow-up with orthopedics as mentioned below Message sent to cardiology to arrange cardiac monitor.    Home Health: SNF Equipment/Devices: None  CODE STATUS: Full code  DISCHARGE CONDITION: fair  Diet recommendation: Heart healthy  INITIAL HISTORY: 78 y.o. 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  Patient sustained right pubic ramus fracture with hematoma in pelvis, right humeral head fracture. Trauma surgery cleared her, orthopedic consulted, admitted to [facility] service.   Consultants: Orthopedics   Procedures:  11/18 right reverse total shoulder arthroplasty, right open reduction internal fixation of tuberosities.   HOSPITAL COURSE:   Right supracondylar humerus fracture 11/17 ORIF to the right shoulder Continue pain control with oxycodone , tramadol  and muscle relaxant with  methocarbamol .  Seen by PT and OT.  Will need short-term rehab. Outpatient follow-up with orthopedics.   Closed fracture of multiple pubic rami (HCC) Supportive medical therapy Pain is reasonably well-controlled.   New onset paroxysmal a-fib [facility]) Patient was placed on amiodarone  drip for rate control,  Patient converted to sinus rhythm Now off amiodarone .  Patient was seen by cardiology.  Patient's episode of atrial fibrillation was very brief, lasting only about 3 hours.  Cardiology discussed with patient and decided to hold off on anticoagulation considering history of epistaxis requiring cautery.   Plan for outpatient cardiac monitoring. Occasional episodes of SVT noted.  Low-dose beta-blocker initiated.   Essential hypertension Blood pressure is reasonably well-controlled.  Patient noted to be on spironolactone .  Will hold her ARB since she will be discharged on metoprolol .  Okay to continue spironolactone .  She is on furosemide  as needed for edema.   Acute on chronic diastolic (congestive) heart failure (HCC) Echocardiogram with preserved LV systolic function with EF 60 to 65%, RV systolic function preserved, mild to moderate aortic stenosis, LA and RA with normal size.  Improved volume status Plan to continue with furosemide  only as needed.    Normocytic anemia/symptomatic anemia Drop in hemoglobin is likely multifactorial including recent trauma as well as surgery.  Patient was symptomatic.  She was transfused 2 units of PRBC with improvement in symptoms and improvement in hemoglobin.   Anemia panel reviewed.  Ferritin 257, iron 24, TIBC 203, percent saturation 12.  Folic acid 10.3.  B12 457.   COPD No signs of acute exacerbation  Acute on chronic hypoxemic respiratory failure, multifactorial History of pulmonary fibrosis.    Chronic kidney disease, stage 3a Renal  function is stable.     Hypothyroidism Continue with levothyroxine     GERD Continue pantoprazole     UTI  (urinary tract infection) Klebsiella bacteremia. Patient was placed on antibiotic therapy with significant improvement No signs of spesis (ruled out)  Follow up cultures with no growth.    Obesity, class 2 Calculated BMI is 36.7   Patient is stable.  Discussed with patient and her son today.  Okay for discharge to SNF when bed is available.  PERTINENT LABS:  The results of significant diagnostics from this hospitalization (including imaging, microbiology, ancillary and laboratory) are listed below for reference.    Microbiology: Recent Results (from the past 240 hours)  Culture, blood (Routine X 2) w Reflex to ID Panel     Status: None   Collection Time: 11/13/23  6:34 AM   Specimen: BLOOD LEFT HAND  Result Value Ref Range Status   Specimen Description BLOOD LEFT HAND  Final   Special Requests   Final    BOTTLES DRAWN AEROBIC AND ANAEROBIC Blood Culture adequate volume   Culture   Final    NO GROWTH 5 DAYS Performed at [facility], [address] [place], [name] [address]    Report Status 11/18/2023 FINAL  Final  Culture, blood (Routine X 2) w Reflex to ID Panel     Status: None   Collection Time: 11/13/23  6:43 AM   Specimen: BLOOD LEFT HAND  Result Value Ref Range Status   Specimen Description BLOOD LEFT HAND  Final   Special Requests   Final    BOTTLES DRAWN AEROBIC AND ANAEROBIC Blood Culture results may not be optimal due to an inadequate volume of blood received in culture bottles   Culture   Final    NO GROWTH 5 DAYS Performed at [facility], [address], [place], [name] [address]    Report Status 11/18/2023 FINAL  Final     Labs:   Basic Metabolic Panel: Recent Labs  Lab 11/18/23 0406 11/19/23 0542 11/20/23 0517 11/21/23 0506 11/22/23 0537  NA 137 136 136 136 135  K 4.5 4.9 4.3 3.9 4.1  CL 101 99 99 98 101  CO2 23 21* 23 23 23   GLUCOSE 72 96 94 91 81  BUN 16 23 25* 22 21  CREATININE 0.86 0.99 1.07* 0.92 0.94  CALCIUM  8.3* 8.0*  7.9* 7.8* 7.9*  MG 2.1  --   --   --   --   PHOS 3.8  --   --   --   --     CBC: Recent Labs  Lab 11/18/23 0406 11/19/23 0542 11/20/23 0517 11/20/23 1153 11/21/23 0506 11/21/23 1903 11/22/23 0537  WBC 10.7* 12.9* 12.6*  --  11.2*  --  10.3  HGB 9.2* 8.5* 7.7* 7.8* 7.2* 11.0* 10.8*  HCT 30.1* 27.8* 25.0* 25.1* 22.8* 33.4* 32.8*  MCV 96.8 95.5 96.9  --  95.8  --  91.6  PLT 446* 457* 422*  --  464*  --  507*    BNP: BNP (last 3 results) Recent Labs    11/10/23 1109  BNP 159.0*    CBG: Recent Labs  Lab 11/21/23 0839  GLUCAP 139*     IMAGING STUDIES DG Shoulder Right Port Result Date: 11/19/2023 CLINICAL DATA:  Postoperative evaluation. EXAM: RIGHT SHOULDER - 1 VIEW COMPARISON:  11/07/2023. FINDINGS: Status post right reverse shoulder arthroplasty. The glenoid and humeral prosthetic components appear aligned. Acromioclavicular joint is anatomically aligned. No appreciable  acute complication. Expected postoperative soft tissue swelling and intra-articular air. IMPRESSION: Status post right reverse shoulder arthroplasty. No evidence of acute complication. Electronically Signed   By: Harrietta Sherry M.D.   On: 11/19/2023 10:07   DG CHEST PORT 1 VIEW Result Date: 11/10/2023 EXAM: 1 VIEW(S) XRAY OF THE CHEST 11/10/2023 08:21:15 PM COMPARISON: Comparison with the 11/06/2023. CLINICAL HISTORY: Fever. FINDINGS: LUNGS AND PLEURA: Unchanged diffuse interstitial coarsening. Similar increased density in the left suprahilar region better evaluated on recent CT from 11/06/2023. This may reflect posttherapeutic change. No pulmonary edema. No pleural effusion. No pneumothorax. HEART AND MEDIASTINUM: Stable cardiomediastinal silhouette. Aortic atherosclerotic calcification. BONES AND SOFT TISSUES: Redemonstrated right humerus and right rib fractures. IMPRESSION: 1. No acute process. 2. 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 3. Left suprahilar scarring and mass better  evaluated on cto 11 / 5 / 25. 4. Redemonstrated right humerus and right rib fractures. Electronically signed by: Norman Gatlin MD 11/10/2023 08:31 PM EST RP Workstation: [id number]   ECHOCARDIOGRAM COMPLETE Result Date: 11/10/2023    ECHOCARDIOGRAM REPORT   Patient Name:   Teresa Jensen Date of Exam: 11/10/2023 Medical Rec #:  [id number]        Height:       57.0 in Accession #:    [id number]       Weight:       160.0 lb Date of Birth:  05/07/1945        BSA:          1.636 m Patient Age:    78 years         BP:           85/59 mmHg Patient Gender: F                HR:           60 bpm. Exam Location:  Inpatient Procedure: 2D Echo, Cardiac Doppler and Color Doppler (Both Spectral and Color            Flow Doppler were utilized during procedure). Indications:    Atrial Fibrillation I48.91  History:        Patient has prior history of Echocardiogram examinations, most                 recent 09/07/2022. CHF, COPD and CKD, stage 3,                 Signs/Symptoms:Dyspnea; Risk Factors:Hypertension.  Sonographer:    Thea Norlander RCS Referring Phys: DORN JULIANNA ROSS  Sonographer Comments: No apical window. IMPRESSIONS  1. Left ventricular ejection fraction, by estimation, is 60 to 65%. The left ventricle has normal function. The left ventricle has no regional wall motion abnormalities. Left ventricular diastolic parameters are indeterminate.  2. Right ventricular systolic function is normal. The right ventricular size is normal.  3. The mitral valve is normal in structure. No evidence of mitral valve regurgitation. No evidence of mitral stenosis.  4. Technically difficult apical windows, Dopplers of the LVOT and AV were not obtained. Not able to assess gradients. Morphologically likely mild at most moderate aortic stenosis. . The aortic valve has an indeterminant number of cusps. There is mild calcification of the aortic valve. There is mild thickening of the aortic valve. Aortic valve regurgitation is mild.  5.  The inferior vena cava is normal in size with greater than 50% respiratory variability, suggesting right atrial pressure of 3 mmHg. FINDINGS  Left Ventricle: Left ventricular ejection fraction, by estimation,  is 60 to 65%. The left ventricle has normal function. The left ventricle has no regional wall motion abnormalities. The left ventricular internal cavity size was normal in size. There is  no left ventricular hypertrophy. Left ventricular diastolic parameters are indeterminate. Right Ventricle: The right ventricular size is normal. Right vetricular wall thickness was not well visualized. Right ventricular systolic function is normal. Left Atrium: Left atrial size was normal in size. Right Atrium: Right atrial size was normal in size. Pericardium: There is no evidence of pericardial effusion. Mitral Valve: The mitral valve is normal in structure. No evidence of mitral valve regurgitation. No evidence of mitral valve stenosis. Tricuspid Valve: The tricuspid valve is not well visualized. Tricuspid valve regurgitation is trivial. No evidence of tricuspid stenosis. Aortic Valve: Technically difficult apical windows, Dopplers of the LVOT and AV were not obtained. Not able to assess gradients. Morphologically likely mild at most moderate aortic stenosis. The aortic valve has an indeterminant number of cusps. There is  mild calcification of the aortic valve. There is mild thickening of the aortic valve. There is mild aortic valve annular calcification. Aortic valve regurgitation is mild. Pulmonic Valve: The pulmonic valve was not well visualized. Pulmonic valve regurgitation is trivial. No evidence of pulmonic stenosis. Aorta: The aortic root and ascending aorta are structurally normal, with no evidence of dilitation. Venous: The inferior vena cava is normal in size with greater than 50% respiratory variability, suggesting right atrial pressure of 3 mmHg. IAS/Shunts: No atrial level shunt detected by color flow Doppler.   LEFT VENTRICLE PLAX 2D LVIDd:         4.10 cm   Diastology LVIDs:         2.60 cm   LV e' medial:    6.74 cm/s LV PW:         1.00 cm   LV E/e' medial:  10.0 LV IVS:        1.00 cm   LV e' lateral:   7.94 cm/s LVOT diam:     2.10 cm   LV E/e' lateral: 8.5 LV SV:         35 LV SV Index:   21 LVOT Area:     3.46 cm  IVC IVC diam: 1.80 cm LEFT ATRIUM         Index LA diam:    4.10 cm 2.51 cm/m  AORTIC VALVE LVOT Vmax:   50.20 cm/s LVOT Vmean:  33.500 cm/s LVOT VTI:    0.100 m  AORTA Ao Root diam: 2.80 cm Ao Asc diam:  3.00 cm MITRAL VALVE               TRICUSPID VALVE MV Area (PHT): 3.85 cm    TR Peak grad:   13.4 mmHg MV Decel Time: 197 msec    TR Vmax:        183.00 cm/s MV E velocity: 67.40 cm/s MV A velocity: 52.80 cm/s  SHUNTS MV E/A ratio:  1.28        Systemic VTI:  0.10 m                            Systemic Diam: 2.10 cm Dorn Ross MD Electronically signed by Dorn Ross MD Signature Date/Time: 11/10/2023/3:06:40 PM    Final    DG Shoulder Right Result Date: 11/07/2023 CLINICAL DATA:  Fall with right shoulder pain. EXAM: RIGHT SHOULDER - 2+ VIEW COMPARISON:  Chest x-ray 11/06/2023 FINDINGS: Exam  demonstrates degenerative changes of the [facility] joint and glenohumeral joints. There is a minimally displaced fracture of the humeral neck/head. No evidence of shoulder dislocation. Suggestion of right posterolateral seventh rib fracture. Diffuse decreased bone mineralization. Remainder of the exam is unchanged. IMPRESSION: 1. Minimally displaced fracture of the humeral neck/head. 2. Suggestion of right posterolateral seventh rib fracture. Electronically Signed   By: Toribio Agreste M.D.   On: 11/07/2023 07:53   CT HEAD WO CONTRAST Result Date: 11/06/2023 EXAM: CT HEAD WITHOUT CONTRAST 11/06/2023 07:27:00 PM TECHNIQUE: CT of the head was performed without the administration of intravenous contrast. Automated exposure control, iterative reconstruction, and/or weight based adjustment of the mA/kV was utilized  to reduce the radiation dose to as low as reasonably achievable. COMPARISON: None available. CLINICAL HISTORY: Head trauma, moderate-severe FINDINGS: BRAIN AND VENTRICLES: No acute hemorrhage. No evidence of acute infarct. No hydrocephalus. No extra-axial collection. No mass effect or midline shift. ORBITS: No acute abnormality. SINUSES: No acute abnormality. SOFT TISSUES AND SKULL: No acute soft tissue abnormality. No skull fracture. IMPRESSION: 1. No acute intracranial abnormality or skull fracture. Electronically signed by: Maude Stammer MD 11/06/2023 07:47 PM EST RP Workstation: [id number]   CT CERVICAL SPINE WO CONTRAST Result Date: 11/06/2023 EXAM: CT CERVICAL SPINE WITHOUT CONTRAST 11/06/2023 07:27:00 PM TECHNIQUE: CT of the cervical spine was performed without the administration of intravenous contrast. Multiplanar reformatted images are provided for review. Automated exposure control, iterative reconstruction, and/or weight based adjustment of the mA/kV was utilized to reduce the radiation dose to as low as reasonably achievable. COMPARISON: None available. CLINICAL HISTORY: Polytrauma, blunt FINDINGS: CERVICAL SPINE: BONES AND ALIGNMENT: Normal alignment of the cervical vertebral bodies. No acute cervical spine fracture is identified. Surgical changes related to prior C4-C5 and C5-C6 anterior interbody fusion. No complicating features are identified. The facets are normally aligned. No fractures. DEGENERATIVE CHANGES: The spinal canal is fairly generous. No significant canal stenosis. Small focal calcified disc protrusion noted at C3-C4 with mass effect on the central aspect of the thecal sac. There is also moderate left foraminal stenosis at this level. SOFT TISSUES: No abnormal prevertebral soft tissue swelling. No neck mass, adenopathy, or hematoma. The lung apices are grossly clear. Advanced emphysematous changes and pulmonary scarring noted. IMPRESSION: 1. No acute cervical spine fracture. 2.  Small calcified disc protrusion at C3-4 with thecal sac mass effect and moderate left foraminal stenosis. 3. Postsurgical C4-5 and C5-6 anterior interbody fusion without complicating features. Electronically signed by: Maude Stammer MD 11/06/2023 07:46 PM EST RP Workstation: [id number]   CT CHEST ABDOMEN PELVIS WO CONTRAST Result Date: 11/06/2023 EXAM: CT CHEST, ABDOMEN AND PELVIS WITHOUT CONTRAST 11/06/2023 07:27:00 PM TECHNIQUE: CT of the chest, abdomen and pelvis was performed without the administration of intravenous contrast. Multiplanar reformatted images are provided for review. Automated exposure control, iterative reconstruction, and/or weight based adjustment of the mA/kV was utilized to reduce the radiation dose to as low as reasonably achievable. COMPARISON: Prior chest CT 07/29/2023 and prior abdominal/pelvic CT scan 12/20/2022. CLINICAL HISTORY: FINDINGS: CHEST: MEDIASTINUM AND LYMPH NODES: The heart is normal in size. No pericardial effusion. Stable tortuosity and calcification of the thoracic aorta and branch vessels. Stable 3-vessel coronary artery calcifications. Stable scattered mediastinal and hilar lymph nodes but no new or progressive findings. The central airways are clear. The esophagus is grossly normal. LUNGS AND PLEURA: Progressive medial left upper lobe soft tissue density most likely reflecting progressive radiation fibrosis could not totally exclude the possibility of recurrent tumor. Stable advanced emphysematous  changes and pulmonary scarring. Stable 7 mm nodular lesion in the left lower lobe on image 57/06. Progressive nodularity along the nearby major fissure is indeterminate. No new pulmonary nodules. No acute pulmonary process. No pulmonary contusion, pneumothorax, or pleural effusion. ABDOMEN AND PELVIS: LIVER: No acute hepatic injury or perihepatic fluid collection. GALLBLADDER AND BILE DUCTS: The gallbladder is surgically absent. Common bile duct dilatation. SPLEEN: No  acute splenic injury or perisplenic fluid collection. PANCREAS: No acute pancreatic injury or peripancreatic fluid collection. ADRENAL GLANDS: Stable bilateral adrenal gland nodules consistent with benign adenomas. KIDNEYS, URETERS AND BLADDER: No renal lesions or hydronephrosis. No perinephric hematoma. No stones in the kidneys or ureters. No perinephric or periureteral stranding. The bladder is unremarkable. GI AND BOWEL: The stomach, duodenum, small bowel, and colon are grossly normal without oral contrast. No inflammatory changes, mass lesions, or obstructive findings. The terminal ileum is normal. Colonic diverticulosis again noted. REPRODUCTIVE ORGANS: The uterus and ovaries are surgically absent. PERITONEUM AND RETROPERITONEUM: No ascites. No free air. No mesenteric or retroperitoneal mass, adenopathy, or hematoma. VASCULATURE: Advanced atherosclerotic calcification involving the aorta and branch vessels but no aneurysm. ABDOMINAL AND PELVIS LYMPH NODES: No lymphadenopathy. BONES AND SOFT TISSUES: The bony thorax is intact. No acute sternal, rib, or thoracic vertebral body fractures. No chest wall contusion or breast mass. Superior and inferior pubic rami fractures on the right side. Both hips are normally located. No hip fractures. The pubic symphysis and SI joints are intact. The lumbar vertebral bodies are intact. Right-sided extraperitoneal pelvic hematoma surrounding the bladder, findings secondary to right-sided pubic rami fractures. IMPRESSION: 1. Progressive medial left upper lobe soft tissue density most consistent with progressive radiation fibrosis; recurrent tumor cannot be excluded. Recommend short-interval chest CT follow-up or PET/CT and oncologic evaluation as indicated. 2. Stable 7 mm left lower lobe pulmonary nodule. 3. Progressive indeterminate nodularity along the adjacent major fissure. Recommend short-interval chest CT follow-up to assess for stability. 4. Right-sided extraperitoneal  pelvic hematoma surrounding the bladder, secondary to right-sided pubic rami fractures. Electronically signed by: Maude Stammer MD 11/06/2023 07:42 PM EST RP Workstation: [id number]   DG Pelvis Portable Result Date: 11/06/2023 CLINICAL DATA:  Clemens, pain EXAM: PORTABLE PELVIS 1-2 VIEWS COMPARISON:  12/20/2022 FINDINGS: Supine frontal view of the pelvis was obtained, the patient is slightly rotated toward the left. No acute displaced fracture, subluxation, or dislocation. Joint spaces are relatively well preserved. Prominent atherosclerosis of the aorta and its branches. IMPRESSION: 1. No acute displaced fracture. Electronically Signed   By: Ozell Daring M.D.   On: 11/06/2023 17:22   DG Chest Port 1 View Result Date: 11/06/2023 CLINICAL DATA:  Clemens, right shoulder pain EXAM: PORTABLE CHEST 1 VIEW COMPARISON:  09/06/2022 FINDINGS: Frontal view of the chest was obtained, excluding the right costophrenic angle by collimation. The cardiac silhouette is unremarkable. Increased density in the left suprahilar region compatible with patient's known history of lung cancer, may reflect post therapeutic change given findings on recent CT. Chronic consolidation within the periphery of the superior segment of the left lower lobe again noted and stable. No new airspace disease, effusion, or pneumothorax. There is a displaced impacted fracture through the right humeral neck, incompletely characterized on this exam due to collimation. There are acute displaced right lateral fifth through eighth rib fractures as well. Prior healed left rib fractures are incidentally noted. IMPRESSION: 1. Acute fractures involving the right humeral neck and right lateral fifth through eighth ribs. 2. Chronic left suprahilar density unchanged since recent CT,  compatible with known history of lung cancer. This could reflect post therapeutic changes in this region and continued follow-up is recommended. 3. Chronic scarring within the left lower  lobe. Electronically Signed   By: Ozell Daring M.D.   On: 11/06/2023 17:21    DISCHARGE EXAMINATION: Vitals:   11/21/23 1525 11/21/23 1728 11/21/23 2124 11/22/23 0354  BP: (!) 130/45 (!) 123/48 (!) 125/48 (!) 137/48  Pulse: 73 72 70 65  Resp: 18 18  18   Temp: 98.6 F (37 C) (!) 97.5 F (36.4 C) 97.6 F (36.4 C) (!) 97.5 F (36.4 C)  TempSrc: Oral  Oral Oral  SpO2: 100% 97% 98% 98%  Weight:      Height:       General appearance: Awake alert.  In no distress Resp: Clear to auscultation bilaterally.  Normal effort Cardio: S1-S2 is normal regular.  No S3-S4.  No rubs murmurs or bruit GI: Abdomen is soft.  Nontender nondistended.  Bowel sounds are present normal.  No masses organomegaly    DISPOSITION: SNF  Discharge Instructions     Call MD for:  difficulty breathing, headache or visual disturbances   Complete by: As directed    Call MD for:  extreme fatigue   Complete by: As directed    Call MD for:  persistant dizziness or light-headedness   Complete by: As directed    Call MD for:  persistant nausea and vomiting   Complete by: As directed    Call MD for:  severe uncontrolled pain   Complete by: As directed    Call MD for:  temperature >100.4   Complete by: As directed    Diet - low sodium heart healthy   Complete by: As directed    Discharge instructions   Complete by: As directed    Please review instructions on the discharge summary.  You were cared for by a hospitalist during your hospital stay. If you have any questions about your discharge medications or the care you received while you were in the hospital after you are discharged, you can call the unit and asked to speak with the hospitalist on call if the hospitalist that took care of you is not available. Once you are discharged, your primary care physician will handle any further medical issues. Please note that NO REFILLS for any discharge medications will be authorized once you are discharged, as it is  imperative that you return to your primary care physician (or establish a relationship with a primary care physician if you do not have one) for your aftercare needs so that they can reassess your need for medications and monitor your lab values. If you do not have a primary care physician, you can call [phone] for a physician referral.   Increase activity slowly   Complete by: As directed    No wound care   Complete by: As directed          Allergies as of 11/22/2023       Reactions   Epinephrine  Anaphylaxis, Shortness Of Breath   Food Rash   MANGO = cause rashes around the mouth LIPS GO NUMB   Mangifera Indica Rash   THIS IS MANGO LIPS GO NUMB   Prednisone  Anaphylaxis, Hives, Shortness Of Breath, Itching, Swelling   The face swells HAS TAKEN CORTISONE SHOTS WITHOUT ISSUE BEFORE Cannot take oral tablets either   Ivp Dye [iodinated Contrast Media] Hives   Lactose Intolerance (gi) Diarrhea   Prevnar 13 [pneumococcal 13-val Conj  Vacc] Other (See Comments)   Arm temp elevated at site of injection, red, itching        Medication List     STOP taking these medications    losartan  100 MG tablet Commonly known as: COZAAR        TAKE these medications    acetaminophen  500 MG tablet Commonly known as: TYLENOL  Take 1 tablet (500 mg total) by mouth every 4 (four) hours as needed for mild pain (pain score 1-3).   alendronate  70 MG tablet Commonly known as: FOSAMAX  Take 70 mg by mouth every Sunday. Take with a full glass of water on an empty stomach.   ALPRAZolam  0.25 MG tablet Commonly known as: XANAX  Take 1 tablet (0.25 mg total) by mouth 2 (two) times daily as needed for anxiety.   aspirin  EC 81 MG tablet Take 1 tablet (81 mg total) by mouth 2 (two) times daily. Swallow whole.   atorvastatin  10 MG tablet Commonly known as: LIPITOR TAKE 1 TABLET BY MOUTH EVERY DAY FOR CHOLESTEROL   Bevespi  Aerosphere 9-4.8 MCG/ACT Aero Generic drug:  Glycopyrrolate -Formoterol  Inhale 2 puffs into the lungs 2 (two) times daily.   CALCIUM  + VITAMIN D3 PO Take 1,200 mg by mouth daily.   famotidine  20 MG tablet Commonly known as: PEPCID  TAKE 1 TABLET BY MOUTH AFTER SUPPER What changed: See the new instructions.   Farxiga  10 MG Tabs tablet Generic drug: dapagliflozin propanediol  TAKE 1 TABLET BY MOUTH DAILY BEFORE BREAKFAST.   furosemide  40 MG tablet Commonly known as: Lasix  Take 1 tablet (40 mg total) by mouth daily as needed for edema.   levothyroxine  75 MCG tablet Commonly known as: SYNTHROID  TAKE 1 TABLET BY MOUTH EVERY MORNING ON EMPTY STOMACH WITH WATER ONLY,NO FOOD OR OTHER MED X30MINUTES   methocarbamol  500 MG tablet Commonly known as: ROBAXIN  Take 1 tablet (500 mg total) by mouth 3 (three) times daily.   metoprolol  tartrate 25 MG tablet Commonly known as: LOPRESSOR  Take 0.5 tablets (12.5 mg total) by mouth 2 (two) times daily.   oxyCODONE  5 MG immediate release tablet Commonly known as: Oxy IR/ROXICODONE  Take 1 tablet (5 mg total) by mouth every 6 (six) hours as needed for severe pain (pain score 7-10).   pantoprazole  40 MG tablet Commonly known as: Protonix  Take 1 tablet (40 mg total) by mouth daily. for heartburn. What changed:  when to take this reasons to take this   pentoxifylline  400 MG CR tablet Commonly known as: TRENTAL  Take 1 tablet (400 mg total) by mouth in the morning and at bedtime.   polyethylene glycol 17 g packet Commonly known as: MIRALAX  / GLYCOLAX  Take 17 g by mouth 2 (two) times daily.   PRESERVISION AREDS 2 PO Take 1 tablet by mouth in the morning and at bedtime.   senna-docusate 8.6-50 MG tablet Commonly known as: Senokot-S Take 2 tablets by mouth 2 (two) times daily.   spironolactone  25 MG tablet Commonly known as: ALDACTONE  Take 0.5 tablets (12.5 mg total) by mouth daily.   tacrolimus  0.1 % ointment Commonly known as: PROTOPIC  Apply to aa's lower legs BID PRN. What  changed:  how much to take how to take this when to take this reasons to take this additional instructions          Follow-up Information     Cristy Bonner DASEN, MD Follow up in 2 week(s).   Specialty: Orthopedic Surgery Why: For suture removal Contact information: [address] [name] [address] [phone]  Gretta Comer POUR, NP. Schedule an appointment as soon as possible for a visit in 1 week(s).   Specialty: Internal Medicine Why: post hospitalization follow up Contact information: [address] [name] [address] [phone]                 TOTAL DISCHARGE TIME: 35 minutes  Morgen Ritacco Verdene  [facility] Pager on [email or web address]  11/22/2023, 9:27 AM

## 2023-11-22 NOTE — Progress Notes (Signed)
 30d monitor for afib. Dr. Mady to read

## 2023-11-22 NOTE — Plan of Care (Signed)
  Problem: Education: Goal: Knowledge of General Education information will improve Description: Including pain rating scale, medication(s)/side effects and non-pharmacologic comfort measures Outcome: Progressing   Problem: Nutrition: Goal: Adequate nutrition will be maintained Outcome: Progressing   Problem: Elimination: Goal: Will not experience complications related to urinary retention Outcome: Progressing   Problem: Pain Managment: Goal: General experience of comfort will improve and/or be controlled Outcome: Progressing

## 2023-11-29 ENCOUNTER — Encounter (HOSPITAL_COMMUNITY): Payer: Self-pay | Admitting: Orthopaedic Surgery

## 2023-12-04 ENCOUNTER — Telehealth: Payer: Self-pay | Admitting: Physician Assistant

## 2023-12-04 NOTE — Telephone Encounter (Signed)
 Pt called in to inform that she is in rehab and unable to wear heart monitor right now. She would like to wait until after she leave rehab center to put it on. She just wanted to let the provider know.

## 2023-12-04 NOTE — Telephone Encounter (Signed)
 Left a message for the patient to call back.

## 2023-12-04 NOTE — Telephone Encounter (Signed)
 Patient of Dr. Mady  Upcoming visit with C. Franchester PA

## 2023-12-05 ENCOUNTER — Other Ambulatory Visit: Payer: Self-pay | Admitting: Internal Medicine

## 2023-12-05 NOTE — Telephone Encounter (Signed)
 Pt in rehab for hip and arm break - will wear Zio after discharge and f/u C Furth after that  - appt rescheduled

## 2023-12-10 ENCOUNTER — Encounter (HOSPITAL_COMMUNITY): Payer: Self-pay | Admitting: Orthopaedic Surgery

## 2023-12-16 ENCOUNTER — Ambulatory Visit: Admitting: Medical

## 2024-01-03 ENCOUNTER — Other Ambulatory Visit: Payer: Self-pay | Admitting: Family

## 2024-01-13 ENCOUNTER — Telehealth: Payer: Self-pay

## 2024-01-13 NOTE — Transitions of Care (Post Inpatient/ED Visit) (Signed)
 "  01/13/2024  Name: Teresa Jensen MRN: [id number] DOB: 1945-04-14  Today's TOC FU Call Status: Today's TOC FU Call Status:: Successful TOC FU Call Completed TOC FU Call Complete Date: 01/13/24  Patient's Name and Date of Birth confirmed. DOB, Name  Transition Care Management Follow-up Telephone Call Date of Discharge: 01/10/24 Discharge Facility: Other ([facility]) Name of Other ([facility]) Discharge Facility: [facility] Type of Discharge: Inpatient Admission Primary Inpatient Discharge Diagnosis:: Fall How have you been since you were released from the hospital?: Better Any questions or concerns?: Yes Patient Questions/Concerns:: (S) Would like to discuss medications in detail Patient Questions/Concerns Addressed: Notified Provider of Patient Questions/Concerns  Items Reviewed: Did you receive and understand the discharge instructions provided?: Yes Medications obtained,verified, and reconciled?: Yes (Medications Reviewed) Any new allergies since your discharge?: No Dietary orders reviewed?: NA Do you have support at home?: Yes  Medications Reviewed Today: Medications Reviewed Today     Reviewed by Lavelle Charmaine NOVAK, LPN (Licensed Practical Nurse) on 01/13/24 at [id number]  Med List Status: <None>   Medication Order Taking? Sig Documenting Provider Last Dose Status Informant  acetaminophen  (TYLENOL ) 500 MG tablet [id number]  Take 1 tablet (500 mg total) by mouth every 4 (four) hours as needed for mild pain (pain score 1-3). Krishnan, Gokul, MD  Active   alendronate  (FOSAMAX ) 70 MG tablet [id number] No Take 70 mg by mouth every Sunday. Take with a full glass of water on an empty stomach. [provider] 10/27/2023 Active Self, Pharmacy Records  ALPRAZolam  (XANAX ) 0.25 MG tablet [id number]  Take 1 tablet (0.25 mg total) by mouth 2 (two) times daily as needed for anxiety. Krishnan, Gokul, MD  Active   atorvastatin  (LIPITOR) 10 MG tablet [id number] No TAKE 1 TABLET BY  MOUTH EVERY DAY FOR CHOLESTEROL  Patient not taking: Reported on 11/07/2023   Gretta Comer POUR, NP Not Taking Active Self, Pharmacy Records  Calcium  Carb-Cholecalciferol (CALCIUM  + VITAMIN D3 PO) [id number] No Take 1,200 mg by mouth daily. [provider] 11/06/2023 Morning Active Self, Pharmacy Records  dapagliflozin propanediol  (FARXIGA ) 10 MG TABS tablet [id number] No TAKE 1 TABLET BY MOUTH DAILY BEFORE BREAKFAST. Donette Ellouise LABOR, [place] 11/06/2023 Morning Active Self, Pharmacy Records  famotidine  (PEPCID ) 20 MG tablet [id number] No TAKE 1 TABLET BY MOUTH AFTER SUPPER  Patient taking differently: Take 20 mg by mouth daily as needed for heartburn.   Darlean Ozell NOVAK, MD Past Week Active Self, Pharmacy Records  furosemide  (LASIX ) 40 MG tablet [id number]  Take 1 tablet (40 mg total) by mouth daily as needed for edema. Krishnan, Gokul, MD  Active   Glycopyrrolate -Formoterol  (BEVESPI  AEROSPHERE) 9-4.8 MCG/ACT TERESE [id number]  Inhale 2 puffs into the lungs 2 (two) times daily. Tamea Dedra CROME, MD  Active   levothyroxine  (SYNTHROID ) 75 MCG tablet [id number] No TAKE 1 TABLET BY MOUTH EVERY MORNING ON EMPTY STOMACH WITH WATER ONLY,NO FOOD OR OTHER MED MARRIE Gretta Comer POUR, NP 11/06/2023 Morning Active Self, Pharmacy Records  methocarbamol  (ROBAXIN ) 500 MG tablet [id number]  Take 1 tablet (500 mg total) by mouth 3 (three) times daily. Krishnan, Gokul, MD  Active   metoprolol  tartrate (LOPRESSOR ) 25 MG tablet [id number]  Take 0.5 tablets (12.5 mg total) by mouth 2 (two) times daily. Verdene Purchase, MD  Active   Multiple Vitamins-Minerals (PRESERVISION AREDS 2 PO) [id number] No Take 1 tablet by mouth in the morning and at bedtime. [provider] 11/06/2023 Morning Active Self, Pharmacy Records  oxyCODONE  (OXY IR/ROXICODONE ) 5 MG immediate release  tablet [id number]  Take 1 tablet (5 mg total) by mouth every 6 (six) hours as needed for severe pain (pain score 7-10). Krishnan, Gokul, MD  Active    pantoprazole  (PROTONIX ) 40 MG tablet [id number] No Take 1 tablet (40 mg total) by mouth daily. for heartburn.  Patient taking differently: Take 40 mg by mouth daily as needed (for heartburn). for heartburn.   Gretta Comer POUR, NP Past Month Active Self, Pharmacy Records  pentoxifylline  (TRENTAL ) 400 MG CR tablet [id number] No Take 1 tablet (400 mg total) by mouth in the morning and at bedtime. Claudene Lehmann, MD 11/06/2023 Morning Active Self, Pharmacy Records  polyethylene glycol (MIRALAX  / GLYCOLAX ) 17 g packet [id number]  Take 17 g by mouth 2 (two) times daily. Verdene Purchase, MD  Active   senna-docusate (SENOKOT-S) 8.6-50 MG tablet [id number]  Take 2 tablets by mouth 2 (two) times daily. Krishnan, Gokul, MD  Active   spironolactone  (ALDACTONE ) 25 MG tablet [id number]  TAKE 1/2 TABLET BY MOUTH EVERY DAY [place] A, FNP  Active   tacrolimus  (PROTOPIC ) 0.1 % ointment [id number] No Apply to aa's lower legs BID PRN.  Patient taking differently: Apply 1 Application topically 2 (two) times daily as needed (for lower legs).   Claudene Lehmann, MD 11/05/2023 Active Self, Pharmacy Records            Home Care and Equipment/Supplies: Were Home Health Services Ordered?: Yes Has Agency set up a time to come to your home?: Yes First Home Health Visit Date: 01/10/24 (call) Any new equipment or medical supplies ordered?: NA  Functional Questionnaire: Do you need assistance with bathing/showering or dressing?: No Do you need assistance with meal preparation?: Yes Do you need assistance with eating?: No Do you have difficulty maintaining continence: No Do you need assistance with getting out of bed/getting out of a chair/moving?: Yes Do you have difficulty managing or taking your medications?: No  Follow up appointments reviewed: PCP Follow-up appointment confirmed?: Yes Date of PCP follow-up appointment?: 01/22/24 Follow-up Provider: Comer Gretta NP Specialist Hospital Follow-up  appointment confirmed?: Yes Follow-Up Specialty Provider:: Ortho- [facility] Do you need transportation to your follow-up appointment?: No Do you understand care options if your condition(s) worsen?: Yes-patient verbalized understanding    SIGNATURE Charmaine Bloodgood, LPN [facility] Health Advisor [facility] You Are. We Are. [facility] Direct Dial [phone]  "

## 2024-01-14 ENCOUNTER — Other Ambulatory Visit (HOSPITAL_COMMUNITY): Payer: Self-pay

## 2024-01-14 ENCOUNTER — Encounter: Payer: Self-pay | Admitting: Family

## 2024-01-14 ENCOUNTER — Telehealth: Payer: Self-pay

## 2024-01-14 NOTE — Telephone Encounter (Signed)
 Advanced Heart Failure Patient Advocate Encounter  The patient was approved for a [employer] grant that will help cover the cost of Farxiga , Metoprolol .  Total amount awarded, $7,500.  Effective: 12/15/2023 - 12/13/2024.  BIN [id number] PCN [id number] Group [id number] ID [id number]  Pharmacy provided with approval and processing information. Patient informed via MyChart.  Rachel DEL, CPhT Rx Patient Advocate Phone: [phone]

## 2024-01-16 ENCOUNTER — Telehealth: Payer: Self-pay

## 2024-01-16 NOTE — Telephone Encounter (Signed)
 Copied from CRM [id number]. Topic: Clinical - Home Health Verbal Orders >> Jan 16, 2024  4:05 PM Charolett CROME wrote: Caller/Agency: [facility] Callback Number: [phone] Service Requested: Occupational Therapy Frequency: 1x week 1, 2x week 3 Any new concerns about the patient? No

## 2024-01-16 NOTE — Telephone Encounter (Signed)
 Approved.

## 2024-01-17 NOTE — Telephone Encounter (Unsigned)
 Copied from CRM [id number]. Topic: Clinical - Home Health Verbal Orders >> Jan 17, 2024  4:00 PM Diboll E wrote: Caller/Agency: [facility] [place] Number: [phone] Service Requested: Physical Therapy Frequency:  1 week 4, 1 every other week 4 Any new concerns about the patient? No

## 2024-01-17 NOTE — Telephone Encounter (Signed)
Verbal orders approved.

## 2024-01-20 NOTE — Telephone Encounter (Signed)
 Verbal orders given to Angie as requested for PT.

## 2024-01-20 NOTE — Telephone Encounter (Signed)
 Copied from CRM [id number]. Topic: Clinical - Home Health Verbal Orders >> Jan 20, 2024  1:13 PM Delon DASEN wrote: Caller/Agency: Mardeen with [facility] Callback Number: [phone] Service Requested: Occupational Therapy Frequency: 1 wk 1 wk and 2 wk 3 wks Any new concerns about the patient? No- very limited range of motion   Per chart review Mallie has approved occupational therapy on 1/15. I have called Mardeen and let her know.

## 2024-01-22 ENCOUNTER — Ambulatory Visit: Payer: Self-pay | Admitting: Primary Care

## 2024-01-22 ENCOUNTER — Encounter: Payer: Self-pay | Admitting: Primary Care

## 2024-01-22 ENCOUNTER — Ambulatory Visit: Admitting: Primary Care

## 2024-01-22 VITALS — BP 138/70 | HR 90 | Temp 97.9°F | Ht <= 58 in | Wt 160.4 lb

## 2024-01-22 DIAGNOSIS — Z9889 Other specified postprocedural states: Secondary | ICD-10-CM

## 2024-01-22 DIAGNOSIS — S32599A Other specified fracture of unspecified pubis, initial encounter for closed fracture: Secondary | ICD-10-CM | POA: Diagnosis not present

## 2024-01-22 DIAGNOSIS — I4891 Unspecified atrial fibrillation: Secondary | ICD-10-CM | POA: Diagnosis not present

## 2024-01-22 DIAGNOSIS — Z8781 Personal history of (healed) traumatic fracture: Secondary | ICD-10-CM | POA: Diagnosis not present

## 2024-01-22 DIAGNOSIS — I10 Essential (primary) hypertension: Secondary | ICD-10-CM

## 2024-01-22 DIAGNOSIS — D649 Anemia, unspecified: Secondary | ICD-10-CM | POA: Diagnosis not present

## 2024-01-22 LAB — BASIC METABOLIC PANEL WITH GFR
BUN: 11 mg/dL (ref 6–23)
CO2: 30 meq/L (ref 19–32)
Calcium: 9.2 mg/dL (ref 8.4–10.5)
Chloride: 100 meq/L (ref 96–112)
Creatinine, Ser: 0.77 mg/dL (ref 0.40–1.20)
GFR: 73.67 mL/min
Glucose, Bld: 119 mg/dL — ABNORMAL HIGH (ref 70–99)
Potassium: 3.6 meq/L (ref 3.5–5.1)
Sodium: 138 meq/L (ref 135–145)

## 2024-01-22 LAB — CBC
HCT: 34.4 % — ABNORMAL LOW (ref 36.0–46.0)
Hemoglobin: 11.4 g/dL — ABNORMAL LOW (ref 12.0–15.0)
MCHC: 33.2 g/dL (ref 30.0–36.0)
MCV: 90.8 fl (ref 78.0–100.0)
Platelets: 321 K/uL (ref 150.0–400.0)
RBC: 3.79 Mil/uL — ABNORMAL LOW (ref 3.87–5.11)
RDW: 16.1 % — ABNORMAL HIGH (ref 11.5–15.5)
WBC: 8.6 K/uL (ref 4.0–10.5)

## 2024-01-22 NOTE — Assessment & Plan Note (Signed)
 Overall stable.  She will discuss resuming losartan  with cardiology. Continue metoprolol  tartrate 12.5 mg BID and spironolactone  12.5 mg daily.

## 2024-01-22 NOTE — Assessment & Plan Note (Signed)
 With recent hospitalization. Hospital notes, labs, imaging reviewed.   Continue home health PT and OT. Follow up with orthopedics as scheduled.

## 2024-01-22 NOTE — Assessment & Plan Note (Signed)
 Non surgical. Following with orthopedics.

## 2024-01-22 NOTE — Progress Notes (Signed)
 "  Subjective:    Patient ID: Teresa Jensen, female    DOB: 11/28/1945, 79 y.o.   MRN: [id number]  Teresa Jensen is a very pleasant 79 y.o. female with a history of hypertension, CHF, atrial fibrillation, COPD, CKD, right supracondylar humerus fracture, squamous cell carcinoma of lung, closed fracture of multiple pubic rami, who presents today for hospital follow up.  She presented to [facility] on 11/05/24 after a tripping and falling on the ground outdoors.  Workup in the ED revealed multiple fractures including pubic rami and right radius humerus with acute on chronic respiratory failure.  She was admitted for further evaluation.  During her hospital stay she underwent ORIF of the right humerus on 11/18/2023 for which was initially delayed due to bacteremia.  She developed new onset atrial fibrillation with RVR, heart rate greater than 140.  She initiated on amiodarone  IV.  She also developed transient confusion with fevers which was suspected to be secondary to UTI.  She was treated with IV ceftriaxone .  She did require 2 units of packed red blood cells transfused due to anemia.  Cardiology consulted who did not recommend anticoagulant.  Amiodarone  was discontinued.  She was discharged to SNF on 11/22/2023 and recommended to follow-up with orthopedics and cardiology in the outpatient setting.  She was also placed on a cardiac monitor.  She was initiated on metoprolol  to tartrate 12.5 mg twice daily, ARB was discontinued.  She was discharged from SNF on 01/10/24. She never wore the heart monitor and has it at home. The metoprolol  causes her lips to feel funny. She denies throat tightness, angioedema. She would like to resume losartan  as she did better. She has an appointment with cardiology scheduled for next week. She's been evaluated by orthopedics a few weeks ago. She is visited by home health nursing and physical and occupational therapy.   BP Readings from Last 3 Encounters:  01/22/24 138/70   11/22/23 (!) 126/44  10/22/23 (!) 140/60     Review of Systems  Respiratory:  Positive for shortness of breath.   Cardiovascular:  Negative for chest pain.  Musculoskeletal:  Positive for arthralgias.  Neurological:  Negative for dizziness.         Past Medical History:  Diagnosis Date   (HFpEF) heart failure with preserved ejection fraction (HCC) 06/28/2011   a.) TTE 06/28/2011: EF 55-60%, mild AR, G1DD; b.) TTE 01/16/2013: EF 55-60%, mild MR; c.) TTE 02/16/2018: EF 60-65%, G1DD; d.) MPI 11/26/2018: EF 56%   Abnormal electrocardiogram 11/12/2018   Acute kidney failure 01/12/2017   Acute on chronic respiratory failure with hypoxia (HCC) 09/06/2022   Adenoma of left adrenal gland    Anemia    Anxiety    Aortic atherosclerosis    Bilateral carpal tunnel syndrome    CAD (coronary artery disease)    a.) HR CT chest 10/30/2021: 2 vessel CAD (LAD/RCA)   CAP (community acquired pneumonia)    COPD (chronic obstructive pulmonary disease) (HCC)    COPD exacerbation (HCC) 01/21/2018   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   Diverticulosis    Dyspnea    DUE TO HEAT WITH COPD   Epistaxis 01/09/2017   GERD (gastroesophageal reflux disease)    Herpes zoster 07/08/2015   Hypertension    Hypothyroidism    Iatrogenic hypotension 01/12/2017   Iliac artery stenosis, bilateral    Morbid obesity with BMI of 40.0-44.9, adult (HCC)    New onset of headaches    Osteopenia    PMR (  polymyalgia rheumatica)    Pneumonia 02/13/2018   PONV (postoperative nausea and vomiting)    Pulmonary nodule, left    a.) CT chest 10/30/2021: 1.5 cm medial LUL nodule; b.) PET CT 11/21/2021: hypermetabolic LUL nodule (SUX max 9.3)   S/P carpal tunnel release 08/04/2020   Shingles    Stasis dermatitis    Strain of chest wall 01/20/2011   Tobacco abuse    Vitamin D  deficiency     Social History   Socioeconomic History   Marital status: Married    Spouse name: [name]   Number of children: 1    Years of education: Not on file   Highest education level: 12th grade  Occupational History   Occupation: Catering Manager as a Paramedic: RETIRED    Comment: retired  Tobacco Use   Smoking status: Former    Current packs/day: 0.00    Average packs/day: 1.5 packs/day for 20.0 years (30.0 ttl pk-yrs)    Types: Cigarettes    Start date: 02/11/1989    Quit date: 02/11/2009    Years since quitting: 14.9   Smokeless tobacco: Never  Vaping Use   Vaping status: Never Used  Substance and Sexual Activity   Alcohol use: No    Alcohol/week: 0.0 standard drinks of alcohol   Drug use: No   Sexual activity: Not Currently  Other Topics Concern   Not on file  Social History Narrative   Lives in [place] with her husband.  Takes care of her two grandchildren, ages 4 and 2, everyday in [place].  Has one son who is 32.   Desires CPR   Would not want prolonged life support if futile   Social Drivers of Health   Tobacco Use: Medium Risk (01/22/2024)   Patient History    Smoking Tobacco Use: Former    Smokeless Tobacco Use: Never    Passive Exposure: Not on file  Financial Resource Strain: Patient Declined (05/01/2023)   Overall Financial Resource Strain (CARDIA)    Difficulty of Paying Living Expenses: Patient declined  Food Insecurity: Patient Declined (11/08/2023)   Epic    Worried About Programme Researcher, Broadcasting/film/video in the Last Year: Patient declined    Barista in the Last Year: Patient declined  Transportation Needs: Patient Declined (11/08/2023)   Epic    Lack of Transportation (Medical): Patient declined    Lack of Transportation (Non-Medical): Patient declined  Physical Activity: Sufficiently Active (05/01/2023)   Exercise Vital Sign    Days of Exercise per Week: 3 days    Minutes of Exercise per Session: 60 min  Stress: No Stress Concern Present (05/01/2023)   [employer] of Occupational Health - Occupational Stress Questionnaire    Feeling of Stress : Only a  little  Social Connections: Unknown (11/08/2023)   Social Connection and Isolation Panel    Frequency of Communication with Friends and Family: Patient declined    Frequency of Social Gatherings with Friends and Family: Not on file    Attends Religious Services: Patient declined    Active Member of Clubs or Organizations: Patient declined    Attends Banker Meetings: Patient declined    Marital Status: Patient declined  Intimate Partner Violence: Patient Declined (11/08/2023)   Epic    Fear of Current or Ex-Partner: Patient declined    Emotionally Abused: Patient declined    Physically Abused: Patient declined    Sexually Abused: Patient declined  Depression (PHQ2-9): Low Risk (01/22/2024)  Depression (PHQ2-9)    PHQ-2 Score: 0  Alcohol Screen: Low Risk (05/07/2022)   Alcohol Screen    Last Alcohol Screening Score (AUDIT): 0  Housing: Unknown (11/08/2023)   Epic    Unable to Pay for Housing in the Last Year: Patient declined    Number of Times Moved in the Last Year: 0    Homeless in the Last Year: Patient declined  Utilities: Patient Declined (11/08/2023)   Epic    Threatened with loss of utilities: Patient declined  Health Literacy: Adequate Health Literacy (05/08/2023)   B1300 Health Literacy    Frequency of need for help with medical instructions: Never    Past Surgical History:  Procedure Laterality Date   ANTERIOR CERVICAL DECOMP/DISCECTOMY FUSION  06/22/2004   C4-C6   APPENDECTOMY     CARPAL TUNNEL RELEASE Left 08/31/2019   Procedure: CARPAL TUNNEL RELEASE;  Surgeon: Mardee Lynwood SQUIBB, MD;  Location: [facility] ORS;  Service: Orthopedics;  Laterality: Left;   CARPAL TUNNEL RELEASE Right 06/22/2020   Procedure: CARPAL TUNNEL RELEASE;  Surgeon: Mardee Lynwood SQUIBB, MD;  Location: [facility] ORS;  Service: Orthopedics;  Laterality: Right;   CHOLECYSTECTOMY     COLONOSCOPY     ENDOBRONCHIAL ULTRASOUND Left 01/17/2022   Procedure: ENDOBRONCHIAL ULTRASOUND;  Surgeon: Tamea Dedra CROME, MD;  Location: [facility] ORS;  Service: Pulmonary;  Laterality: Left;   ESOPHAGOGASTRODUODENOSCOPY     HEMORRHOID SURGERY     REVERSE SHOULDER ARTHROPLASTY Right 11/18/2023   Procedure: ARTHROPLASTY, SHOULDER, TOTAL, REVERSE;  Surgeon: Cristy Bonner DASEN, MD;  Location: [facility] OR;  Service: Orthopedics;  Laterality: Right;   TOTAL ABDOMINAL HYSTERECTOMY W/ BILATERAL SALPINGOOPHORECTOMY     TUBAL LIGATION      Family History  Problem Relation Age of Onset   Heart attack Mother 49   Cancer Father        lung/chest wall    Prostate cancer Father    Diabetes Brother        siblings   Hypertension Sister        x 2   Heart disease Brother    Breast cancer Paternal Grandmother    Heart disease Sister    Diabetes Sister    Tuberculosis Sister    Cirrhosis Brother     Allergies[1]  Medications Ordered Prior to Encounter[2]  BP 138/70   Pulse 90   Temp 97.9 F (36.6 C) (Oral)   Ht 4' 9 (1.448 m)   Wt 160 lb 6 oz (72.7 kg)   SpO2 98% Comment: 1 L O2  BMI 34.70 kg/m  Objective:   Physical Exam Cardiovascular:     Rate and Rhythm: Normal rate and regular rhythm.  Pulmonary:     Effort: Pulmonary effort is normal.     Breath sounds: Normal breath sounds.  Musculoskeletal:     Cervical back: Neck supple.  Skin:    General: Skin is warm and dry.  Neurological:     Mental Status: She is alert and oriented to person, place, and time.  Psychiatric:        Mood and Affect: Mood normal.     Physical Exam        Assessment & Plan:  New onset a-fib (HCC) Assessment & Plan: Rate and rhythm regular.   Discussed recommendations to wear the holter monitor. She will discuss with cardiology. Continue metoprolol  tartrate 12.5 mg for now, she will also discuss with cardiology.       Essential hypertension Assessment & Plan: Overall stable.  She will discuss resuming losartan  with cardiology. Continue metoprolol  tartrate 12.5 mg BID and spironolactone  12.5 mg  daily.   Closed fracture of multiple pubic rami, unspecified laterality, initial encounter [facility]) Assessment & Plan: Non surgical. Following with orthopedics.      S/P ORIF (open reduction internal fixation) fracture Assessment & Plan: With recent hospitalization. Hospital notes, labs, imaging reviewed.   Continue home health PT and OT. Follow up with orthopedics as scheduled.    Anemia, unspecified type Assessment & Plan: S/P 2 units of PRBC during recent hospitalization.  Repeat CBC pending. Consider discontinuation of aspirin . Await results.   Orders: -     CBC -     Basic metabolic panel with GFR    Assessment and Plan Assessment & Plan         Comer MARLA Gaskins, NP       [1]  Allergies Allergen Reactions   Epinephrine  Anaphylaxis and Shortness Of Breath   Food Rash    MANGO = cause rashes around the mouth LIPS GO NUMB   Mang[employer] Rash    THIS IS MANGO LIPS GO NUMB   Prednisone  Anaphylaxis, Hives, Shortness Of Breath, Itching and Swelling    The face swells HAS TAKEN CORTISONE SHOTS WITHOUT ISSUE BEFORE Cannot take oral tablets either   Ivp Dye [Iodinated Contrast Media] Hives   Lactose Intolerance (Gi) Diarrhea   Prevnar 13 [Pneumococcal 13-Val Conj Vacc] Other (See Comments)    Arm temp elevated at site of injection, red, itching  [2]  Current Outpatient Medications on File Prior to Visit  Medication Sig Dispense Refill   acetaminophen  (TYLENOL ) 500 MG tablet Take 1 tablet (500 mg total) by mouth every 4 (four) hours as needed for mild pain (pain score 1-3).     alendronate  (FOSAMAX ) 70 MG tablet Take 70 mg by mouth every Sunday. Take with a full glass of water on an empty stomach.     ALPRAZolam  (XANAX ) 0.25 MG tablet Take 1 tablet (0.25 mg total) by mouth 2 (two) times daily as needed for anxiety. 15 tablet 0   atorvastatin  (LIPITOR) 10 MG tablet TAKE 1 TABLET BY MOUTH EVERY DAY FOR CHOLESTEROL 90 tablet 3   Calcium   Carb-Cholecalciferol (CALCIUM  + VITAMIN D3 PO) Take 1,200 mg by mouth daily.     dapagliflozin propanediol  (FARXIGA ) 10 MG TABS tablet TAKE 1 TABLET BY MOUTH DAILY BEFORE BREAKFAST. 90 tablet 3   famotidine  (PEPCID ) 20 MG tablet TAKE 1 TABLET BY MOUTH AFTER SUPPER (Patient taking differently: Take 20 mg by mouth daily as needed for heartburn.) 90 tablet 3   furosemide  (LASIX ) 40 MG tablet Take 1 tablet (40 mg total) by mouth daily as needed for edema.     Glycopyrrolate -Formoterol  (BEVESPI  AEROSPHERE) 9-4.8 MCG/ACT AERO Inhale 2 puffs into the lungs 2 (two) times daily. 3 each 3   levothyroxine  (SYNTHROID ) 75 MCG tablet TAKE 1 TABLET BY MOUTH EVERY MORNING ON EMPTY STOMACH WITH WATER ONLY,NO FOOD OR OTHER MED X30MINUTES 90 tablet 2   metoprolol  tartrate (LOPRESSOR ) 25 MG tablet Take 0.5 tablets (12.5 mg total) by mouth 2 (two) times daily.     Multiple Vitamins-Minerals (PRESERVISION AREDS 2 PO) Take 1 tablet by mouth in the morning and at bedtime.     oxyCODONE  (OXY IR/ROXICODONE ) 5 MG immediate release tablet Take 1 tablet (5 mg total) by mouth every 6 (six) hours as needed for severe pain (pain score 7-10). 30 tablet 0   pantoprazole  (PROTONIX ) 40 MG tablet Take  1 tablet (40 mg total) by mouth daily. for heartburn. (Patient taking differently: Take 40 mg by mouth daily as needed (for heartburn). for heartburn.) 90 tablet 3   pentoxifylline  (TRENTAL ) 400 MG CR tablet Take 1 tablet (400 mg total) by mouth in the morning and at bedtime. 60 tablet 5   polyethylene glycol (MIRALAX  / GLYCOLAX ) 17 g packet Take 17 g by mouth 2 (two) times daily.     senna-docusate (SENOKOT-S) 8.6-50 MG tablet Take 2 tablets by mouth 2 (two) times daily.     spironolactone  (ALDACTONE ) 25 MG tablet TAKE 1/2 TABLET BY MOUTH EVERY DAY 45 tablet 3   tacrolimus  (PROTOPIC ) 0.1 % ointment Apply to aa's lower legs BID PRN. (Patient taking differently: Apply 1 Application topically 2 (two) times daily as needed (for lower legs).) 60  g 5   methocarbamol  (ROBAXIN ) 500 MG tablet Take 1 tablet (500 mg total) by mouth 3 (three) times daily. (Patient not taking: Reported on 01/22/2024)     No current facility-administered medications on file prior to visit.   "

## 2024-01-22 NOTE — Assessment & Plan Note (Signed)
 Rate and rhythm regular.   Discussed recommendations to wear the holter monitor. She will discuss with cardiology. Continue metoprolol  tartrate 12.5 mg for now, she will also discuss with cardiology.

## 2024-01-22 NOTE — Patient Instructions (Addendum)
 Follow up with cardiology and orthopedics.  Stop by the lab prior to leaving today. I will notify you of your results once received.   It was a pleasure to see you today!

## 2024-01-22 NOTE — Assessment & Plan Note (Signed)
 S/P 2 units of PRBC during recent hospitalization.  Repeat CBC pending. Consider discontinuation of aspirin . Await results.

## 2024-01-27 ENCOUNTER — Ambulatory Visit: Admitting: Medical

## 2024-01-30 NOTE — Progress Notes (Signed)
 "  Cardiology Office Note    Date:  01/31/2024   ID:  Teresa Jensen, DOB 12-16-1945, MRN [id number]  PCP:  Gretta Comer POUR, NP  Cardiologist:  Lonni Hanson, MD  Electrophysiologist:  None   Chief Complaint: Follow-up  History of Present Illness:   Teresa Jensen is a 79 y.o. 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  Echo from 06/2011 showed an EF of 55 to 60%, mild LVH, no regional wall motion, grade 1 diastolic dysfunction, and mild aortic insufficiency.   Echo from 01/2013 showed an EF of 55 to 60%, normal wall motion, normal LV diastolic function parameters, and mild aortic insufficiency.   Carotid artery ultrasound from 11/2017 showed 1 to 39% bilateral ICA stenosis with antegrade flow of the bilateral vertebral arteries and normal flow hemodynamics of the bilateral subclavian arteries.   She was admitted to the hospital in 02/2018 with pneumonia.  Echo during the admission showed an EF of 60 to 65%, diastolic dysfunction, normal RV systolic function and ventricular cavity size.  She subsequently established with Dr. Hanson in 11/2018 for evaluation of HFpEF.  Lexiscan  MPI in 11/2018 showed a small in size, moderate severity, fixed defect involving the apical anterior and apical segments consistent with artifact.  EF of 56%.  Aortic atherosclerosis without significant coronary artery calcification noted on CT imaging.  Overall, study was low risk.  Over the years, she has been followed by one of our other APP's and the [facility] CHF clinic.  She was diagnosed with stage I squamous cell carcinoma of the left lung in  01/2022 and underwent SBRT.  PFTs in 08/2022 showing largely stable COPD.  ABIs in 09/2022 0.98 on the right and 0.93 on the left with abnormal bilateral brachial artery waveforms suggestive of more significant PAD.  Echo in 09/2022 showed an EF of 55 to 60%, no regional wall motion abnormalities, normal RV systolic function and ventricular cavity size, mildly elevated RVSP estimated at 39.6 mmHg, mildly dilated left atrium, mild mitral regurgitation, and mild aortic stenosis with a mean gradient of 10 mmHg.  She was last seen in the office in 10/2023 with stable chronic dyspnea and without symptoms of angina or cardiac decompensation.  She was admitted to the hospital in 11/2023 after sustaining a fall with a right supracondylar humeral fracture status post ORIF and right pubis ramus fracture with hematoma conservatively managed.  Admission was notable for new onset A-fib, lasting approximately 3 hours, converting to sinus rhythm with amiodarone  drip as well as URI.  Case was discussed with the patient with decision to hold off on anticoagulation given brief duration, prior epistaxis requiring cauterization, and in the setting of pubic ramus fracture with hematoma with associated acute blood loss anemia requiring 2 units PRBC.  Echo in 11/2023 showed an EF of 60 to 65%, no regional wall motion abnormalities, normal RV systolic function and ventricular cavity size, morphologically mild at most moderate aortic stenosis with difficult apical windows mild aortic insufficiency, and normal CVP.  She was briefly diuresed with subsequent AKI leading to holding of furosemide  while admitted.  Zio patch at discharge was recommended and remains pending.  She comes in accompanied by her husband today and  is doing well from a cardiac perspective, without symptoms of angina or cardiac decompensation.  No palpitations, dizziness, or presyncope, or syncope.  She reports that her fall was mechanical, getting her foot caught  underneath her oxygen tank and tubing while attempting to go out the back door.  This was not a syncopal event.  Would like to defer Zio patch for now while she navigates her open post hospital course, though will wear this down the road.   Labs independently reviewed: 01/2024 - potassium 3.6, BUN 11, serum creatinine 0.77, Hgb 11.4, PLT 321 11/2023 - magnesium  2.1, albumin 2.1, AST/ALT normal, BNP 159 08/2023 - TSH normal, A1c 5.4 05/2023 - TC 124, TG 71, HDL 60, LDL 50  Past Medical History:  Diagnosis Date   (HFpEF) heart failure with preserved ejection fraction (HCC) 06/28/2011   a.) TTE 06/28/2011: EF 55-60%, mild AR, G1DD; b.) TTE 01/16/2013: EF 55-60%, mild MR; c.) TTE 02/16/2018: EF 60-65%, G1DD; d.) MPI 11/26/2018: EF 56%   Abnormal electrocardiogram 11/12/2018   Acute kidney failure 01/12/2017   Acute on chronic respiratory failure with hypoxia (HCC) 09/06/2022   Adenoma of left adrenal gland    Anemia    Anxiety    Aortic atherosclerosis    Bilateral carpal tunnel syndrome    CAD (coronary artery disease)    a.) HR CT chest 10/30/2021: 2 vessel CAD (LAD/RCA)   CAP (community acquired pneumonia)    COPD (chronic obstructive pulmonary disease) (HCC)    COPD exacerbation (HCC) 01/21/2018   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   Diverticulosis    Dyspnea    DUE TO HEAT WITH COPD   Epistaxis 01/09/2017   GERD (gastroesophageal reflux disease)    Herpes zoster 07/08/2015   Hypertension    Hypothyroidism    Iatrogenic hypotension 01/12/2017   Iliac artery stenosis, bilateral    Morbid obesity with BMI of 40.0-44.9, adult (HCC)    New onset of headaches    Osteopenia    PMR (polymyalgia rheumatica)    Pneumonia 02/13/2018   PONV (postoperative nausea and vomiting)    Pulmonary nodule, left    a.) CT chest 10/30/2021: 1.5 cm medial LUL nodule; b.) PET CT 11/21/2021: hypermetabolic LUL nodule (SUX max 9.3)   S/P carpal tunnel release 08/04/2020   Shingles    Stasis  dermatitis    Strain of chest wall 01/20/2011   Tobacco abuse    Vitamin D  deficiency     Past Surgical History:  Procedure Laterality Date   ANTERIOR CERVICAL DECOMP/DISCECTOMY FUSION  06/22/2004   C4-C6   APPENDECTOMY     CARPAL TUNNEL RELEASE Left 08/31/2019   Procedure: CARPAL TUNNEL RELEASE;  Surgeon: Mardee Lynwood SQUIBB, MD;  Location: [facility] ORS;  Service: Orthopedics;  Laterality: Left;   CARPAL TUNNEL RELEASE Right 06/22/2020   Procedure: CARPAL TUNNEL RELEASE;  Surgeon: Mardee Lynwood SQUIBB, MD;  Location: [facility] ORS;  Service: Orthopedics;  Laterality: Right;   CHOLECYSTECTOMY     COLONOSCOPY     ENDOBRONCHIAL ULTRASOUND Left 01/17/2022   Procedure: ENDOBRONCHIAL ULTRASOUND;  Surgeon: Tamea Dedra CROME, MD;  Location: [facility] ORS;  Service: Pulmonary;  Laterality: Left;   ESOPHAGOGASTRODUODENOSCOPY     HEMORRHOID SURGERY     REVERSE SHOULDER ARTHROPLASTY Right 11/18/2023   Procedure: ARTHROPLASTY, SHOULDER, TOTAL, REVERSE;  Surgeon: Cristy Bonner DASEN, MD;  Location: [facility];  Service: Orthopedics;  Laterality: Right;   TOTAL ABDOMINAL HYSTERECTOMY W/ BILATERAL SALPINGOOPHORECTOMY     TUBAL LIGATION  Current Medications: Active Medications[1]  Allergies:   Epinephrine , Food, Mangifera indica, Prednisone , Ivp dye [iodinated contrast media], Lactose intolerance (gi), and Prevnar 13 [pneumococcal 13-val conj vacc]   Social History   Socioeconomic History   Marital status: Married    Spouse name: [name]   Number of children: 1   Years of education: Not on file   Highest education level: 12th grade  Occupational History   Occupation: Catering Manager as a Paramedic: RETIRED    Comment: retired  Tobacco Use   Smoking status: Former    Current packs/day: 0.00    Average packs/day: 1.5 packs/day for 20.0 years (30.0 ttl pk-yrs)    Types: Cigarettes    Start date: 02/11/1989    Quit date: 02/11/2009    Years since quitting: 14.9   Smokeless tobacco: Never  Vaping Use    Vaping status: Never Used  Substance and Sexual Activity   Alcohol use: No    Alcohol/week: 0.0 standard drinks of alcohol   Drug use: No   Sexual activity: Not Currently  Other Topics Concern   Not on file  Social History Narrative   Lives in [place] with her husband.  Takes care of her two grandchildren, ages 76 and 2, everyday in [place].  Has one son who is 15.   Desires CPR   Would not want prolonged life support if futile   Social Drivers of Health   Tobacco Use: Medium Risk (01/31/2024)   Patient History    Smoking Tobacco Use: Former    Smokeless Tobacco Use: Never    Passive Exposure: Not on file  Financial Resource Strain: Patient Declined (05/01/2023)   Overall Financial Resource Strain (CARDIA)    Difficulty of Paying Living Expenses: Patient declined  Food Insecurity: Patient Declined (11/08/2023)   Epic    Worried About Programme Researcher, Broadcasting/film/video in the Last Year: Patient declined    Barista in the Last Year: Patient declined  Transportation Needs: Patient Declined (11/08/2023)   Epic    Lack of Transportation (Medical): Patient declined    Lack of Transportation (Non-Medical): Patient declined  Physical Activity: Sufficiently Active (05/01/2023)   Exercise Vital Sign    Days of Exercise per Week: 3 days    Minutes of Exercise per Session: 60 min  Stress: No Stress Concern Present (05/01/2023)   [employer] of Occupational Health - Occupational Stress Questionnaire    Feeling of Stress : Only a little  Social Connections: Unknown (11/08/2023)   Social Connection and Isolation Panel    Frequency of Communication with Friends and Family: Patient declined    Frequency of Social Gatherings with Friends and Family: Not on file    Attends Religious Services: Patient declined    Active Member of Clubs or Organizations: Patient declined    Attends Banker Meetings: Patient declined    Marital Status: Patient declined  Depression (PHQ2-9): Low  Risk (01/22/2024)   Depression (PHQ2-9)    PHQ-2 Score: 0  Alcohol Screen: Low Risk (05/07/2022)   Alcohol Screen    Last Alcohol Screening Score (AUDIT): 0  Housing: Unknown (11/08/2023)   Epic    Unable to Pay for Housing in the Last Year: Patient declined    Number of Times Moved in the Last Year: 0    Homeless in the Last Year: Patient declined  Utilities: Patient Declined (11/08/2023)   Epic    Threatened with loss of utilities: Patient declined  Health Literacy: Adequate Health Literacy (05/08/2023)   B1300 Health Literacy    Frequency of need for help with medical instructions: Never     Family History:  The patient's family history includes Breast cancer in her paternal grandmother; Cancer in her father; Cirrhosis in her brother; Diabetes in her brother and sister; Heart attack (age of onset: 1) in her mother; Heart disease in her brother and sister; Hypertension in her sister; Prostate cancer in her father; Tuberculosis in her sister.  ROS:   12-point review of systems is negative unless otherwise noted in the HPI.   EKGs/Labs/Other Studies Reviewed:    Studies reviewed were summarized above. The additional studies were reviewed today:  2D echo 11/10/2023: 1. Left ventricular ejection fraction, by estimation, is 60 to 65%. The  left ventricle has normal function. The left ventricle has no regional  wall motion abnormalities. Left ventricular diastolic parameters are  indeterminate.   2. Right ventricular systolic function is normal. The right ventricular  size is normal.   3. The mitral valve is normal in structure. No evidence of mitral valve  regurgitation. No evidence of mitral stenosis.   4. Technically difficult apical windows, Dopplers of the LVOT and AV were  not obtained. Not able to assess gradients. Morphologically likely mild at  most moderate aortic stenosis. . The aortic valve has an indeterminant  number of cusps. There is mild  calcification of the aortic  valve. There is mild thickening of the aortic  valve. Aortic valve regurgitation is mild.   5. The inferior vena cava is normal in size with greater than 50%  respiratory variability, suggesting right atrial pressure of 3 mmHg.  __________  2D echo 09/07/2022: 1. Left ventricular ejection fraction, by estimation, is 55 to 60%. Left  ventricular ejection fraction by 2D MOD biplane is 55.6 %. The left  ventricle has normal function. The left ventricle has no regional wall  motion abnormalities. Left ventricular  diastolic parameters are indeterminate.   2. Right ventricular systolic function is normal. The right ventricular  size is normal. There is mildly elevated pulmonary artery systolic  pressure.   3. Left atrial size was mildly dilated.   4. The mitral valve is normal in structure. Mild mitral valve  regurgitation.   5. The aortic valve is calcified. Aortic valve regurgitation is not  visualized. Mild aortic valve stenosis. Aortic valve mean gradient  measures 10.5 mmHg.   6. The inferior vena cava is normal in size with <50% respiratory  variability, suggesting right atrial pressure of 8 mmHg.  __________   Lexiscan  MPI 11/26/2018: Low risk, probably normal pharmacologic myocardial perfusion stress test. There is a small in size, mild in severity, fixed defect involving the apical anterior and apical segments. This is most consistent with artifact (attenuation and apical thinning) but cannot rule out subtle scar. There is no significant ischemia. The left ventricular ejection fraction is normal (56%). There is no significant coronary artery calcification. Aortic atherosclerosis is noted on the attenuation correction CT. This is a low risk study. __________   2D echo 02/16/2018: 1. The left ventricle has normal systolic function with an ejection  fraction of 60-65%. The cavity size was normal. Left ventricular diastolic  Doppler parameters are consistent with impaired  relaxation.   2. The right ventricle has normal systolic function. The cavity was  normal. There is no increase in right ventricular wall thickness.   3. Right atrial pressure is estimated at 10 mmHg.   4.  Unable to estimate RVSP  __________   2D echo 01/16/2013: - Left ventricle: The cavity size was normal. Systolic    function was normal. The estimated ejection fraction was    in the range of 55% to 60%. Wall motion was normal; there    were no regional wall motion abnormalities. Left    ventricular diastolic function parameters were normal.  - Aortic valve: Mild regurgitation.  __________   2D echo 06/28/2011: - Left ventricle: The cavity size was normal. Wall thickness    was increased in a pattern of mild LVH. Systolic function    was normal. The estimated ejection fraction was in the    range of 55% to 60%. Wall motion was normal; there were no    regional wall motion abnormalities. Doppler parameters are    consistent with abnormal left ventricular relaxation    (grade 1 diastolic dysfunction).  - Aortic valve: Mild regurgitation.   EKG:  EKG is ordered today.  The EKG ordered today demonstrates NSR, 88 bpm, no acute st/t changes  Recent Labs: 08/13/2023: TSH 1.15 11/10/2023: B Natriuretic Peptide 159.0 11/14/2023: ALT 25 11/18/2023: Magnesium  2.1 01/22/2024: BUN 11; Creatinine, Ser 0.77; Hemoglobin 11.4; Platelets 321.0; Potassium 3.6; Sodium 138  Recent Lipid Panel    Component Value Date/Time   CHOL 124 05/30/2023 0852   TRIG 71 05/30/2023 0852   HDL 60 05/30/2023 0852   CHOLHDL 2.1 05/30/2023 0852   VLDL 14 05/30/2023 0852   LDLCALC 50 05/30/2023 0852    PHYSICAL EXAM:    VS:  BP (!) 120/58 (BP Location: Left Arm, Patient Position: Sitting, Cuff Size: Normal)   Pulse 88   Ht 4' 9 (1.448 m)   Wt 152 lb 6.4 oz (69.1 kg)   SpO2 95% Comment: 1 liter O2  BMI 32.98 kg/m   BMI: Body mass index is 32.98 kg/m.  Physical Exam Vitals reviewed.  Constitutional:       Appearance: She is well-developed.  HENT:     Head: Normocephalic and atraumatic.  Eyes:     General:        Right eye: No discharge.        Left eye: No discharge.  Cardiovascular:     Rate and Rhythm: Normal rate and regular rhythm.     Heart sounds: S1 normal and S2 normal. Heart sounds not distant. No midsystolic click and no opening snap. Murmur heard.     Systolic murmur is present with a grade of 1/6 at the upper right sternal border.     No friction rub.  Pulmonary:     Effort: Pulmonary effort is normal. No respiratory distress.     Breath sounds: Normal breath sounds. No decreased breath sounds, wheezing, rhonchi or rales.     Comments: Supplemental oxygen via nasal cannula. Musculoskeletal:     Cervical back: Normal range of motion.  Skin:    General: Skin is warm and dry.     Nails: There is no clubbing.  Neurological:     Mental Status: She is alert and oriented to person, place, and time.  Psychiatric:        Speech: Speech normal.        Behavior: Behavior normal.        Thought Content: Thought content normal.        Judgment: Judgment normal.     Wt Readings from Last 3 Encounters:  01/31/24 152 lb 6.4 oz (69.1 kg)  01/22/24 160 lb 6 oz (  72.7 kg)  11/18/23 170 lb (77.1 kg)     ASSESSMENT & PLAN:   HFpEF/pulmonary hypertension: Euvolemic and well compensated on furosemide  40 mg daily, spironolactone  12.5 mg, and Farxiga  10 mg.  Recent labs stable.  Lone Afib: Isolated episode lasting for approximately 3 hours during acute illness/hospital admission.  Was not placed on anticoagulation given brief episode, history of epistaxis, documented pelvic hematoma in the setting of ramus fracture, and transfusion dependent anemia.  She has deferred Zio patch for now as she navigates the post hospital course with follow-up imaging and working with physical therapy.  She is agreeable to wear this down the road.  Transition Lopressor  12.5 mg twice daily to Toprol -XL 25  mg daily for ease of dosing.  CHA2DS2-VASc at least 6 (CHF, HTN, age x 2, vascular disease, sex category).  HTN: Blood pressure is well-controlled in the office today.  She was transition from losartan  to metoprolol  during hospital admission.  We will transition Lopressor  12.5 mg twice daily to 25 mg daily of Toprol -XL for ease of dosing.  She remains on spironolactone  12.5 mg with stable labs.  Aortic atherosclerosis/HLD: Prior Lexiscan  MPI in 2020 showed no evidence of coronary artery calcification with aortic atherosclerosis.  No symptoms suggestive of angina or coronary insufficiency.  LDL 50 in 05/2023.  Remains on atorvastatin  10 mg.  Chronic hypoxic respiratory failure with underlying COPD, postinflammatory pulmonary fibrosis, and squamous cell carcinoma of the left lung: Stable.  Ongoing management per pulmonology.  PAD: ABIs in 10/2023 showed progressive PAD at 0.63 on the right and 0.67 on the left previously 0.98 and 0.93 respectively.  Has been referred to Dr. Darron.  No nonhealing wounds.  Remains on aspirin  and statin.  Acute blood loss anemia: In the setting of mechanical fall with supracondylar ridge fracture status post ORIF and pubic ramus fracture complicated by hematoma requiring 2 units PRBC in 11/2023.  Recent hemoglobin this month showed continued improvement at 11.4.  AKI: Likely in the setting of overdiuresis during admission in 11/2023.  Serum creatinine improved and normal earlier this month.     Disposition: F/u with Dr. Mady or an APP in 2 months.   Medication Adjustments/Labs and Tests Ordered: Current medicines are reviewed at length with the patient today.  Concerns regarding medicines are outlined above. Medication changes, Labs and Tests ordered today are summarized above and listed in the Patient Instructions accessible in Encounters.   Signed, Bernardino Bring, PA-C 01/31/2024 12:50 PM     [facility] [place] [address] [place], [name] [address] [phone]     [1]  Current Meds  Medication Sig   acetaminophen  (TYLENOL ) 500 MG tablet Take 1 tablet (500 mg total) by mouth every 4 (four) hours as needed for mild pain (pain score 1-3).   alendronate  (FOSAMAX ) 70 MG tablet Take 70 mg by mouth every Sunday. Take with a full glass of water on an empty stomach.   ALPRAZolam  (XANAX ) 0.25 MG tablet Take 1 tablet (0.25 mg total) by mouth 2 (two) times daily as needed for anxiety.   atorvastatin  (LIPITOR) 10 MG tablet TAKE 1 TABLET BY MOUTH EVERY DAY FOR CHOLESTEROL   Calcium  Carb-Cholecalciferol (CALCIUM  + VITAMIN D3 PO) Take 1,200 mg by mouth daily.   dapagliflozin propanediol  (FARXIGA ) 10 MG TABS tablet TAKE 1 TABLET BY MOUTH DAILY BEFORE BREAKFAST.   famotidine  (PEPCID ) 20 MG tablet TAKE 1 TABLET BY MOUTH AFTER SUPPER (Patient taking differently: Take 20 mg by  mouth daily as needed for heartburn.)   furosemide  (LASIX ) 40 MG tablet Take 1 tablet (40 mg total) by mouth daily as needed for edema.   Glycopyrrolate -Formoterol  (BEVESPI  AEROSPHERE) 9-4.8 MCG/ACT AERO Inhale 2 puffs into the lungs 2 (two) times daily.   levothyroxine  (SYNTHROID ) 75 MCG tablet TAKE 1 TABLET BY MOUTH EVERY MORNING ON EMPTY STOMACH WITH WATER ONLY,NO FOOD OR OTHER MED X30MINUTES   methocarbamol  (ROBAXIN ) 500 MG tablet Take 1 tablet (500 mg total) by mouth 3 (three) times daily.   metoprolol  succinate (TOPROL  XL) 25 MG 24 hr tablet Take 1 tablet (25 mg total) by mouth daily.   Multiple Vitamins-Minerals (PRESERVISION AREDS 2 PO) Take 1 tablet by mouth in the morning and at bedtime.   oxyCODONE  (OXY IR/ROXICODONE ) 5 MG immediate release tablet Take 1 tablet (5 mg total) by mouth every 6 (six) hours as needed for severe pain (pain score 7-10).   pantoprazole  (PROTONIX ) 40 MG tablet Take 1 tablet (40 mg total) by mouth daily. for heartburn. (Patient taking differently: Take 40 mg by mouth daily as needed (for heartburn). for heartburn.)    pentoxifylline  (TRENTAL ) 400 MG CR tablet Take 1 tablet (400 mg total) by mouth in the morning and at bedtime.   polyethylene glycol (MIRALAX  / GLYCOLAX ) 17 g packet Take 17 g by mouth 2 (two) times daily.   senna-docusate (SENOKOT-S) 8.6-50 MG tablet Take 2 tablets by mouth 2 (two) times daily.   spironolactone  (ALDACTONE ) 25 MG tablet TAKE 1/2 TABLET BY MOUTH EVERY DAY   tacrolimus  (PROTOPIC ) 0.1 % ointment Apply to aa's lower legs BID PRN. (Patient taking differently: Apply 1 Application topically 2 (two) times daily as needed (for lower legs).)   [DISCONTINUED] metoprolol  tartrate (LOPRESSOR ) 25 MG tablet Take 0.5 tablets (12.5 mg total) by mouth 2 (two) times daily.   "

## 2024-01-31 ENCOUNTER — Telehealth: Payer: Self-pay | Admitting: Radiation Oncology

## 2024-01-31 ENCOUNTER — Encounter: Payer: Self-pay | Admitting: Physician Assistant

## 2024-01-31 ENCOUNTER — Ambulatory Visit: Attending: Physician Assistant | Admitting: Physician Assistant

## 2024-01-31 VITALS — BP 120/58 | HR 88 | Ht <= 58 in | Wt 152.4 lb

## 2024-01-31 DIAGNOSIS — D62 Acute posthemorrhagic anemia: Secondary | ICD-10-CM | POA: Insufficient documentation

## 2024-01-31 DIAGNOSIS — J9611 Chronic respiratory failure with hypoxia: Secondary | ICD-10-CM | POA: Insufficient documentation

## 2024-01-31 DIAGNOSIS — I272 Pulmonary hypertension, unspecified: Secondary | ICD-10-CM | POA: Insufficient documentation

## 2024-01-31 DIAGNOSIS — I5032 Chronic diastolic (congestive) heart failure: Secondary | ICD-10-CM | POA: Insufficient documentation

## 2024-01-31 DIAGNOSIS — J8410 Pulmonary fibrosis, unspecified: Secondary | ICD-10-CM | POA: Insufficient documentation

## 2024-01-31 DIAGNOSIS — J42 Unspecified chronic bronchitis: Secondary | ICD-10-CM | POA: Insufficient documentation

## 2024-01-31 DIAGNOSIS — I700 Atherosclerosis of aorta: Secondary | ICD-10-CM | POA: Insufficient documentation

## 2024-01-31 DIAGNOSIS — N179 Acute kidney failure, unspecified: Secondary | ICD-10-CM | POA: Diagnosis present

## 2024-01-31 DIAGNOSIS — I10 Essential (primary) hypertension: Secondary | ICD-10-CM | POA: Insufficient documentation

## 2024-01-31 DIAGNOSIS — Z85118 Personal history of other malignant neoplasm of bronchus and lung: Secondary | ICD-10-CM | POA: Insufficient documentation

## 2024-01-31 DIAGNOSIS — E785 Hyperlipidemia, unspecified: Secondary | ICD-10-CM | POA: Insufficient documentation

## 2024-01-31 DIAGNOSIS — I4891 Unspecified atrial fibrillation: Secondary | ICD-10-CM | POA: Insufficient documentation

## 2024-01-31 DIAGNOSIS — I739 Peripheral vascular disease, unspecified: Secondary | ICD-10-CM | POA: Diagnosis present

## 2024-01-31 MED ORDER — METOPROLOL SUCCINATE ER 25 MG PO TB24: 25.0000 mg | ORAL_TABLET | Freq: Every day | ORAL | 3 refills | Status: AC

## 2024-01-31 NOTE — Patient Instructions (Signed)
 Medication Instructions:  Your physician recommends the following medication changes.  STOP TAKING: Lopressor    START TAKING: Toprol -XL 25 mg daily  *If you need a refill on your cardiac medications before your next appointment, please call your pharmacy*  Lab Work: None ordered at this time  If you have labs (blood work) drawn today and your tests are completely normal, you will receive your results only by: MyChart Message (if you have MyChart) OR A paper copy in the mail If you have any lab test that is abnormal or we need to change your treatment, we will call you to review the results.  Follow-Up: At [facility], you and your health needs are our priority.  As part of our continuing mission to provide you with exceptional heart care, our providers are all part of one team.  This team includes your primary Cardiologist (physician) and Advanced Practice Providers or APPs (Physician Assistants and Nurse Practitioners) who all work together to provide you with the care you need, when you need it.  Your next appointment:   2 month(s)  Provider:   You may see Lonni Hanson, MD or Bernardino Bring, PA-C

## 2024-01-31 NOTE — Telephone Encounter (Signed)
 Called pt to confirm CT for 2/6 - left vm w/appt date/time/location and my number incase pt needs to r/s - [facility]

## 2024-02-05 ENCOUNTER — Ambulatory Visit: Admitting: Radiation Oncology

## 2024-02-06 ENCOUNTER — Ambulatory Visit: Admission: RE | Admit: 2024-02-06 | Source: Ambulatory Visit

## 2024-02-07 ENCOUNTER — Other Ambulatory Visit

## 2024-02-10 ENCOUNTER — Ambulatory Visit

## 2024-02-13 ENCOUNTER — Ambulatory Visit: Admitting: Radiation Oncology

## 2024-03-24 ENCOUNTER — Ambulatory Visit: Admitting: Physician Assistant

## 2024-05-04 ENCOUNTER — Encounter: Admitting: Family

## 2024-05-08 ENCOUNTER — Ambulatory Visit

## 2024-09-17 ENCOUNTER — Ambulatory Visit

## 2024-09-17 ENCOUNTER — Ambulatory Visit: Admitting: Dermatology
